# Patient Record
Sex: Female | Born: 1960 | Race: White | Hispanic: No | Smoking: Current every day smoker
Health system: Southern US, Community
[De-identification: ages and names within clinical notes are randomized; demographics above are authoritative.]

## PROBLEM LIST (undated history)

## (undated) DIAGNOSIS — F419 Anxiety disorder, unspecified: Secondary | ICD-10-CM

## (undated) DIAGNOSIS — M810 Age-related osteoporosis without current pathological fracture: Secondary | ICD-10-CM

## (undated) DIAGNOSIS — Z8052 Family history of malignant neoplasm of bladder: Secondary | ICD-10-CM

## (undated) DIAGNOSIS — J449 Chronic obstructive pulmonary disease, unspecified: Secondary | ICD-10-CM

## (undated) DIAGNOSIS — J439 Emphysema, unspecified: Secondary | ICD-10-CM

## (undated) DIAGNOSIS — L930 Discoid lupus erythematosus: Secondary | ICD-10-CM

## (undated) DIAGNOSIS — E079 Disorder of thyroid, unspecified: Secondary | ICD-10-CM

## (undated) DIAGNOSIS — M329 Systemic lupus erythematosus, unspecified: Secondary | ICD-10-CM

## (undated) DIAGNOSIS — F32A Depression, unspecified: Secondary | ICD-10-CM

## (undated) DIAGNOSIS — Z8481 Family history of carrier of genetic disease: Secondary | ICD-10-CM

## (undated) DIAGNOSIS — E7800 Pure hypercholesterolemia, unspecified: Secondary | ICD-10-CM

## (undated) DIAGNOSIS — C50919 Malignant neoplasm of unspecified site of unspecified female breast: Secondary | ICD-10-CM

## (undated) DIAGNOSIS — G709 Myoneural disorder, unspecified: Secondary | ICD-10-CM

## (undated) DIAGNOSIS — F411 Generalized anxiety disorder: Secondary | ICD-10-CM

## (undated) DIAGNOSIS — M62838 Other muscle spasm: Secondary | ICD-10-CM

## (undated) DIAGNOSIS — Z803 Family history of malignant neoplasm of breast: Secondary | ICD-10-CM

## (undated) DIAGNOSIS — Z9221 Personal history of antineoplastic chemotherapy: Secondary | ICD-10-CM

## (undated) DIAGNOSIS — K219 Gastro-esophageal reflux disease without esophagitis: Secondary | ICD-10-CM

## (undated) DIAGNOSIS — G629 Polyneuropathy, unspecified: Secondary | ICD-10-CM

## (undated) DIAGNOSIS — D649 Anemia, unspecified: Secondary | ICD-10-CM

## (undated) DIAGNOSIS — T7840XA Allergy, unspecified, initial encounter: Secondary | ICD-10-CM

## (undated) DIAGNOSIS — E039 Hypothyroidism, unspecified: Secondary | ICD-10-CM

## (undated) DIAGNOSIS — J45909 Unspecified asthma, uncomplicated: Secondary | ICD-10-CM

## (undated) DIAGNOSIS — Z8042 Family history of malignant neoplasm of prostate: Secondary | ICD-10-CM

## (undated) DIAGNOSIS — J189 Pneumonia, unspecified organism: Secondary | ICD-10-CM

## (undated) HISTORY — DX: Personal history of antineoplastic chemotherapy: Z92.21

## (undated) HISTORY — DX: Systemic lupus erythematosus, unspecified: M32.9

## (undated) HISTORY — DX: Emphysema, unspecified: J43.9

## (undated) HISTORY — DX: Family history of carrier of genetic disease: Z84.81

## (undated) HISTORY — DX: Depression, unspecified: F32.A

## (undated) HISTORY — DX: Generalized anxiety disorder: F41.1

## (undated) HISTORY — PX: TONSILLECTOMY: SUR1361

## (undated) HISTORY — DX: Myoneural disorder, unspecified: G70.9

## (undated) HISTORY — DX: Family history of malignant neoplasm of bladder: Z80.52

## (undated) HISTORY — DX: Family history of malignant neoplasm of breast: Z80.3

## (undated) HISTORY — DX: Family history of malignant neoplasm of prostate: Z80.42

## (undated) HISTORY — PX: WISDOM TOOTH EXTRACTION: SHX21

## (undated) HISTORY — DX: Allergy, unspecified, initial encounter: T78.40XA

## (undated) HISTORY — DX: Disorder of thyroid, unspecified: E07.9

## (undated) HISTORY — DX: Age-related osteoporosis without current pathological fracture: M81.0

## (undated) HISTORY — DX: Polyneuropathy, unspecified: G62.9

## (undated) HISTORY — PX: ABLATION ON ENDOMETRIOSIS: SHX5787

## (undated) HISTORY — PX: TUBAL LIGATION: SHX77

## (undated) HISTORY — DX: Other muscle spasm: M62.838

## (undated) HISTORY — PX: BREAST LUMPECTOMY: SHX2

## (undated) HISTORY — DX: Pure hypercholesterolemia, unspecified: E78.00

---

## 1998-01-31 ENCOUNTER — Encounter: Payer: Self-pay | Admitting: Neurosurgery

## 1998-01-31 ENCOUNTER — Ambulatory Visit (HOSPITAL_COMMUNITY): Admission: RE | Admit: 1998-01-31 | Discharge: 1998-01-31 | Payer: Self-pay | Admitting: Neurosurgery

## 1999-08-05 ENCOUNTER — Ambulatory Visit (HOSPITAL_COMMUNITY): Admission: RE | Admit: 1999-08-05 | Discharge: 1999-08-05 | Payer: Self-pay | Admitting: Obstetrics and Gynecology

## 2001-02-26 ENCOUNTER — Other Ambulatory Visit: Admission: RE | Admit: 2001-02-26 | Discharge: 2001-02-26 | Payer: Self-pay | Admitting: Obstetrics and Gynecology

## 2002-02-26 ENCOUNTER — Other Ambulatory Visit: Admission: RE | Admit: 2002-02-26 | Discharge: 2002-02-26 | Payer: Self-pay | Admitting: Obstetrics and Gynecology

## 2002-11-20 ENCOUNTER — Encounter (INDEPENDENT_AMBULATORY_CARE_PROVIDER_SITE_OTHER): Payer: Self-pay | Admitting: Specialist

## 2002-11-20 ENCOUNTER — Ambulatory Visit (HOSPITAL_BASED_OUTPATIENT_CLINIC_OR_DEPARTMENT_OTHER): Admission: RE | Admit: 2002-11-20 | Discharge: 2002-11-20 | Payer: Self-pay | Admitting: General Surgery

## 2003-03-13 ENCOUNTER — Other Ambulatory Visit: Admission: RE | Admit: 2003-03-13 | Discharge: 2003-03-13 | Payer: Self-pay | Admitting: Obstetrics and Gynecology

## 2004-06-13 ENCOUNTER — Other Ambulatory Visit: Admission: RE | Admit: 2004-06-13 | Discharge: 2004-06-13 | Payer: Self-pay | Admitting: Obstetrics and Gynecology

## 2004-11-21 ENCOUNTER — Ambulatory Visit (HOSPITAL_COMMUNITY): Admission: RE | Admit: 2004-11-21 | Discharge: 2004-11-21 | Payer: Self-pay | Admitting: Endocrinology

## 2006-01-16 ENCOUNTER — Other Ambulatory Visit: Admission: RE | Admit: 2006-01-16 | Discharge: 2006-01-16 | Payer: Self-pay | Admitting: Obstetrics and Gynecology

## 2006-01-23 ENCOUNTER — Ambulatory Visit (HOSPITAL_COMMUNITY): Admission: RE | Admit: 2006-01-23 | Discharge: 2006-01-23 | Payer: Self-pay | Admitting: Obstetrics and Gynecology

## 2006-01-23 IMAGING — MG MM DIGITAL SCREENING BILAT
4 series · 4 of 4 positions shown · non-contrast
Comparison: none

DG SCREEN MAMMOGRAM BILATERAL
Bilateral CC and MLO view(s) were taken.
Radiologist: SURIN, M.D.

DIGITAL SCREENING MAMMOGRAM WITH CAD:
Prior films are needed for comparison. These will be obtained prior to interpretation.
Mammogram originally interpreted by Dr. SURIN on [DATE].
REVIEW OF OUTSIDE STUDY
Review of Outside Study:
No prior films could be obtained for comparison.
There is a fibroglandular pattern.  A possible mass is noted in the left breast.  Spot compression 
views and possibly sonography are recommended for further evaluation.  In the right breast, no 
masses or malignant type calcifications are identified.

[R CC]
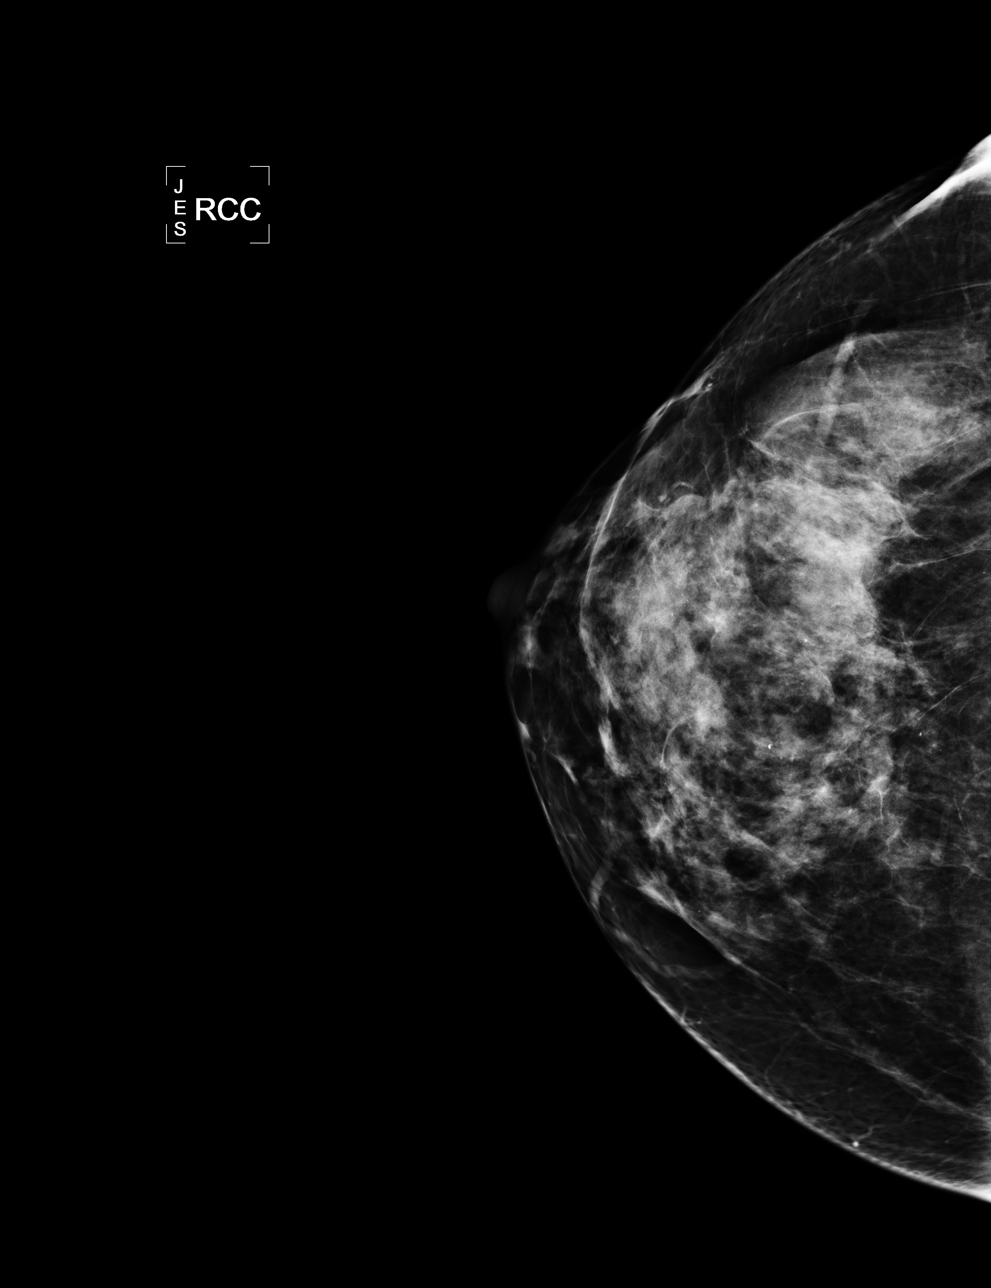

[R MLO]
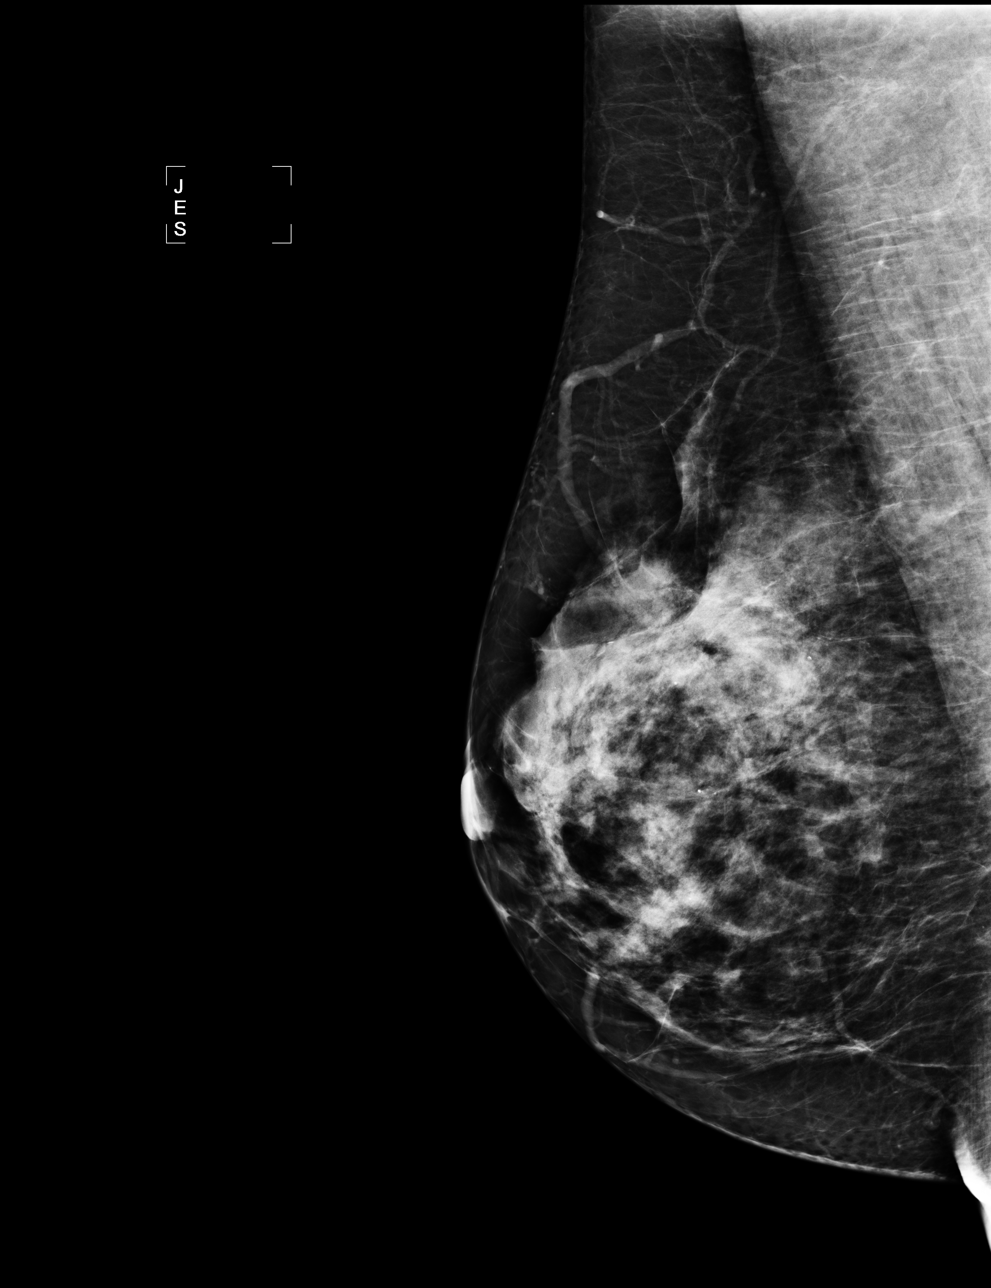

[L CC]
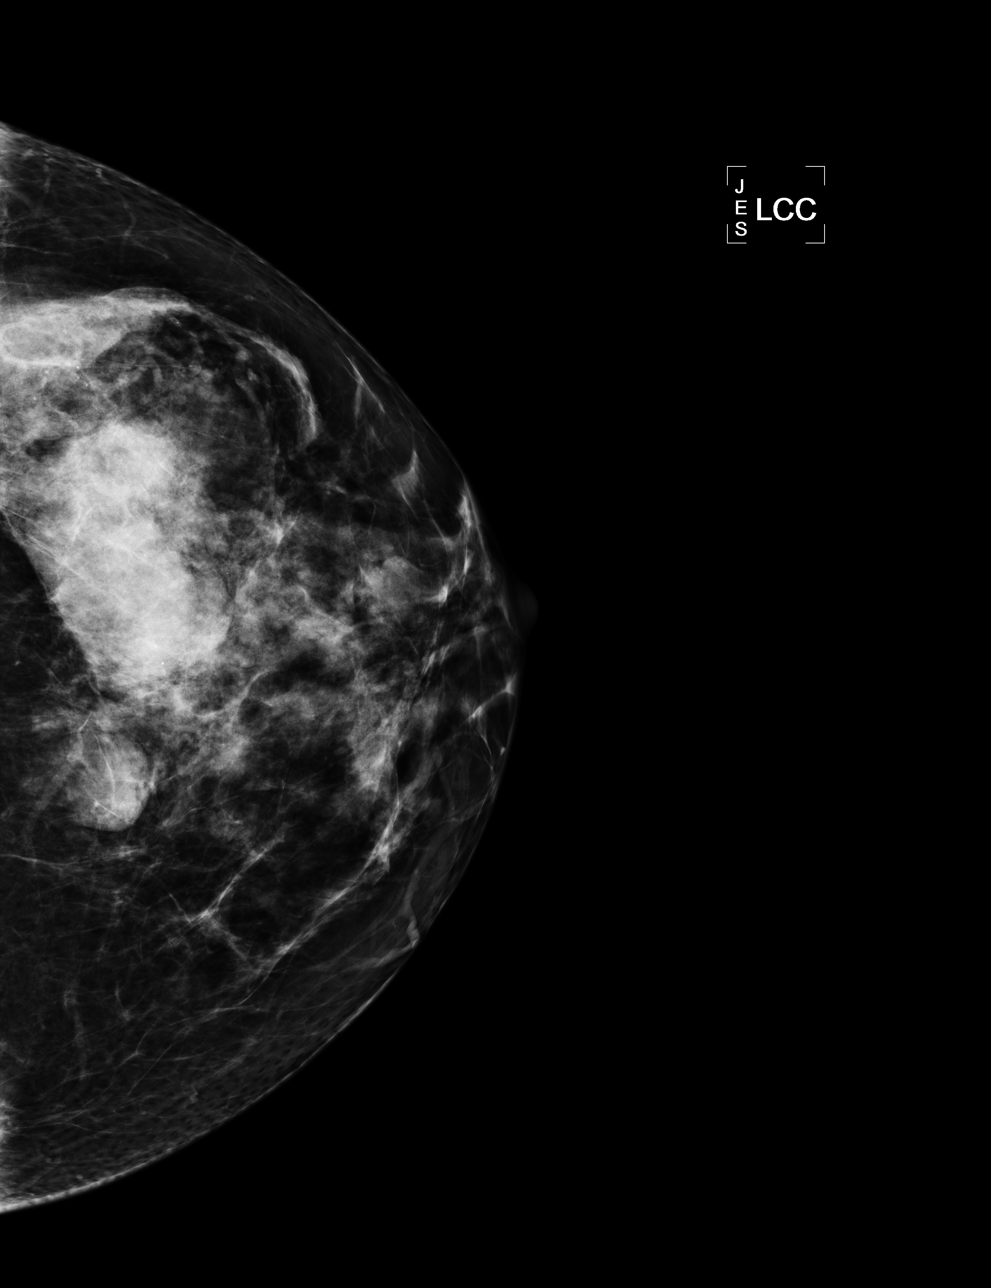

[L MLO]
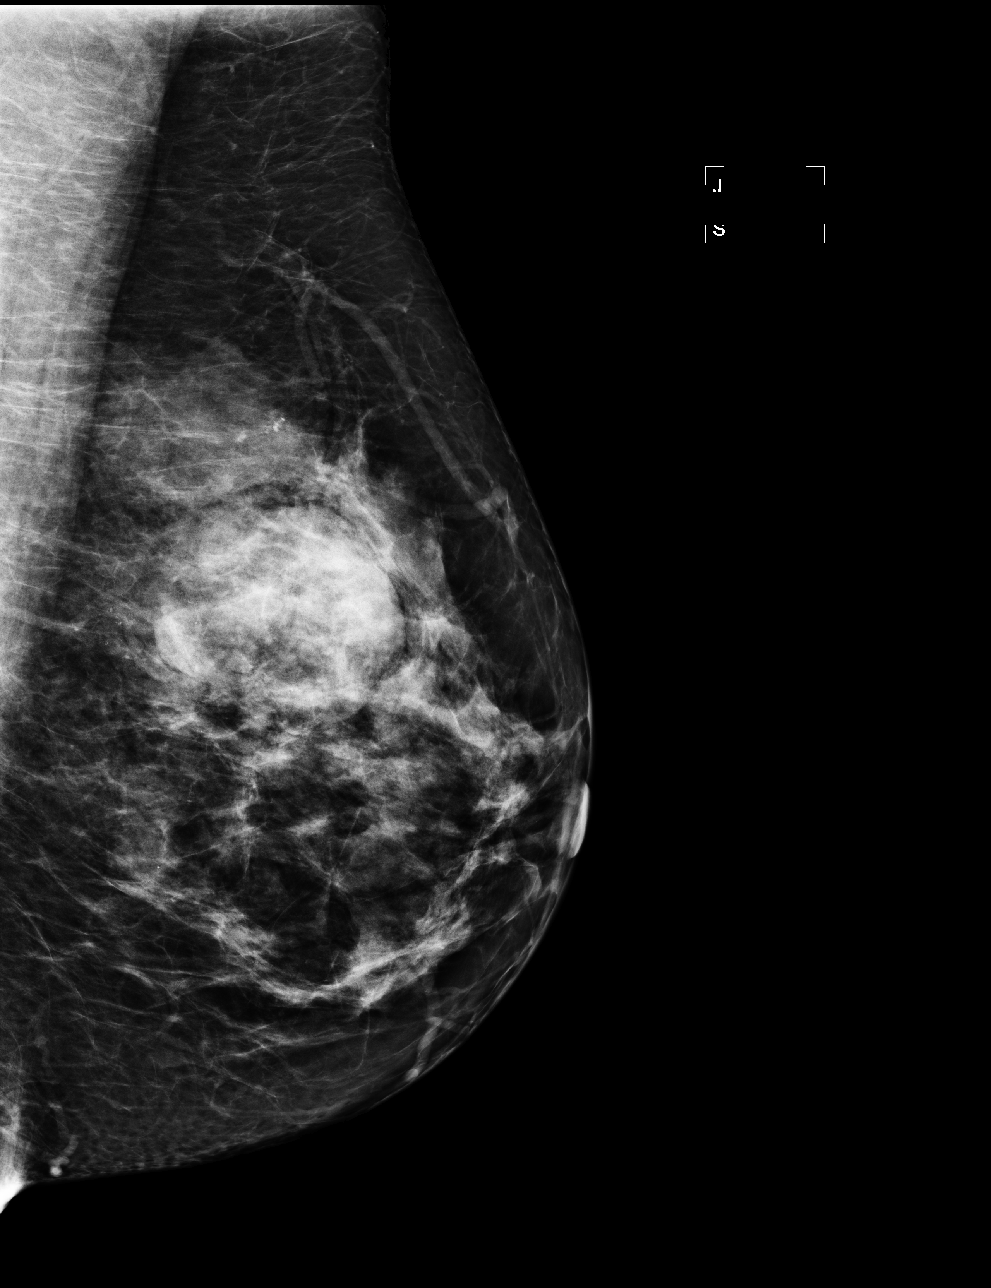

[4 of 4 positions shown; findings below may reference images not displayed]

IMPRESSION: Possible mass, left breast.  Additional evaluation is indicated.  The patient will be contacted for
additional studies and a supplementary report will follow.  No specific mammographic evidence of 
malignancy, right breast.

ASSESSMENT: Need additional imaging evaluation and/or prior mammograms for comparison - BI-RADS 0

Further imaging of the left breast.
ANALYZED BY COMPUTER AIDED DETECTION. ,

## 2006-02-28 ENCOUNTER — Encounter: Admission: RE | Admit: 2006-02-28 | Discharge: 2006-02-28 | Payer: Self-pay | Admitting: Obstetrics and Gynecology

## 2006-02-28 IMAGING — MG MM DIAGNOSTIC LTD LEFT
4 series · 4 of 4 positions shown · non-contrast
Comparison: none

[REDACTED] LEFT
CC and MLO view(s) were taken of the left breast.

LEFT BREAST ULTRASOUND
DIGITAL LIMITED LEFT DIAGNOSTIC MAMMOGRAM AND LEFT BREAST ULTRASOUND:
CLINICAL DATA: Abnormal screening mammogram.

[L CC (1 of 3)]
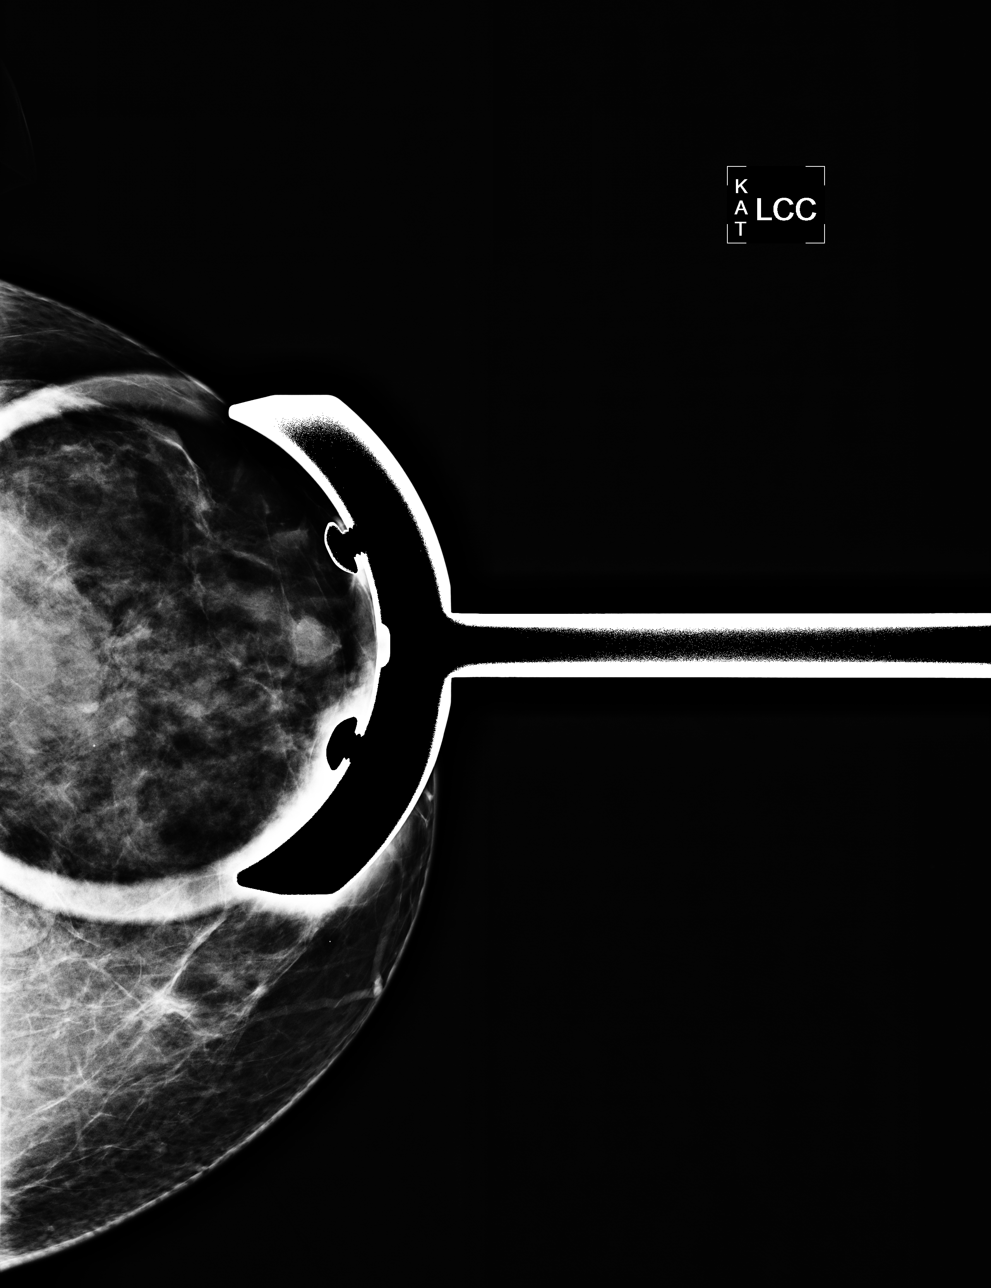

[L CC (2 of 3)]
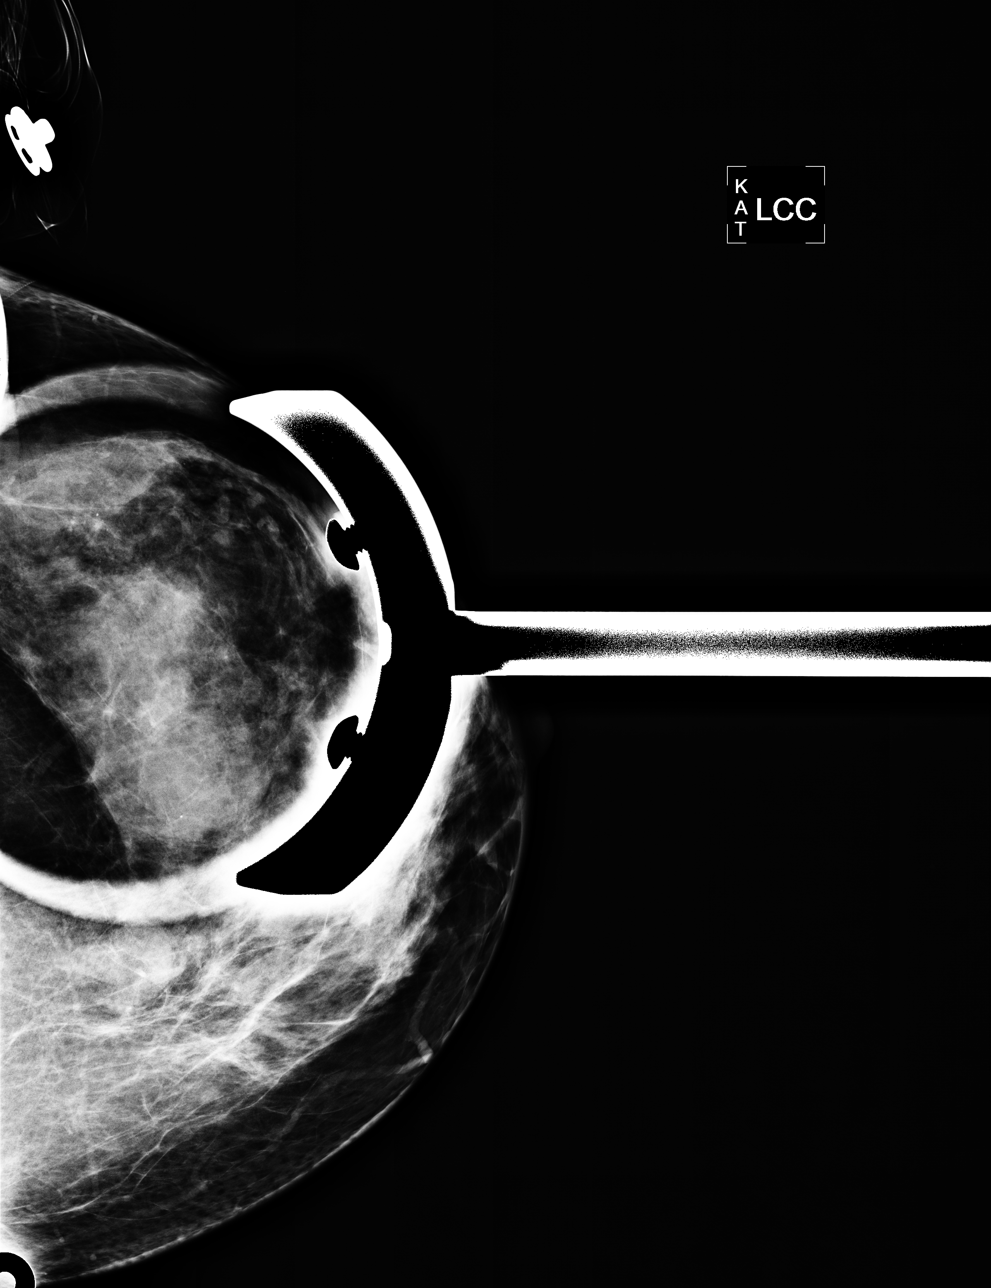

[L MLO]
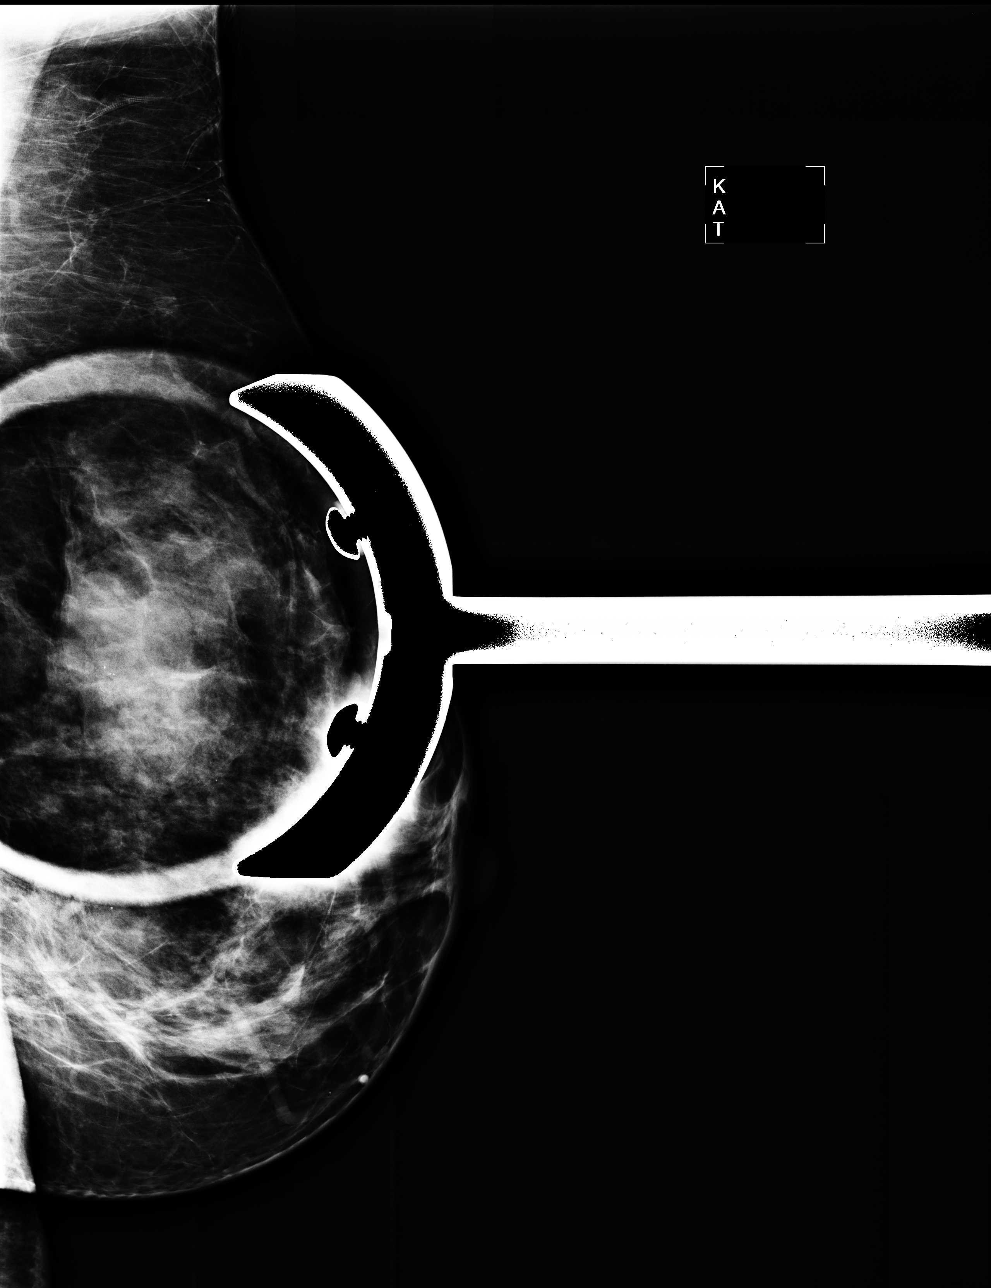

[L CC (3 of 3)]
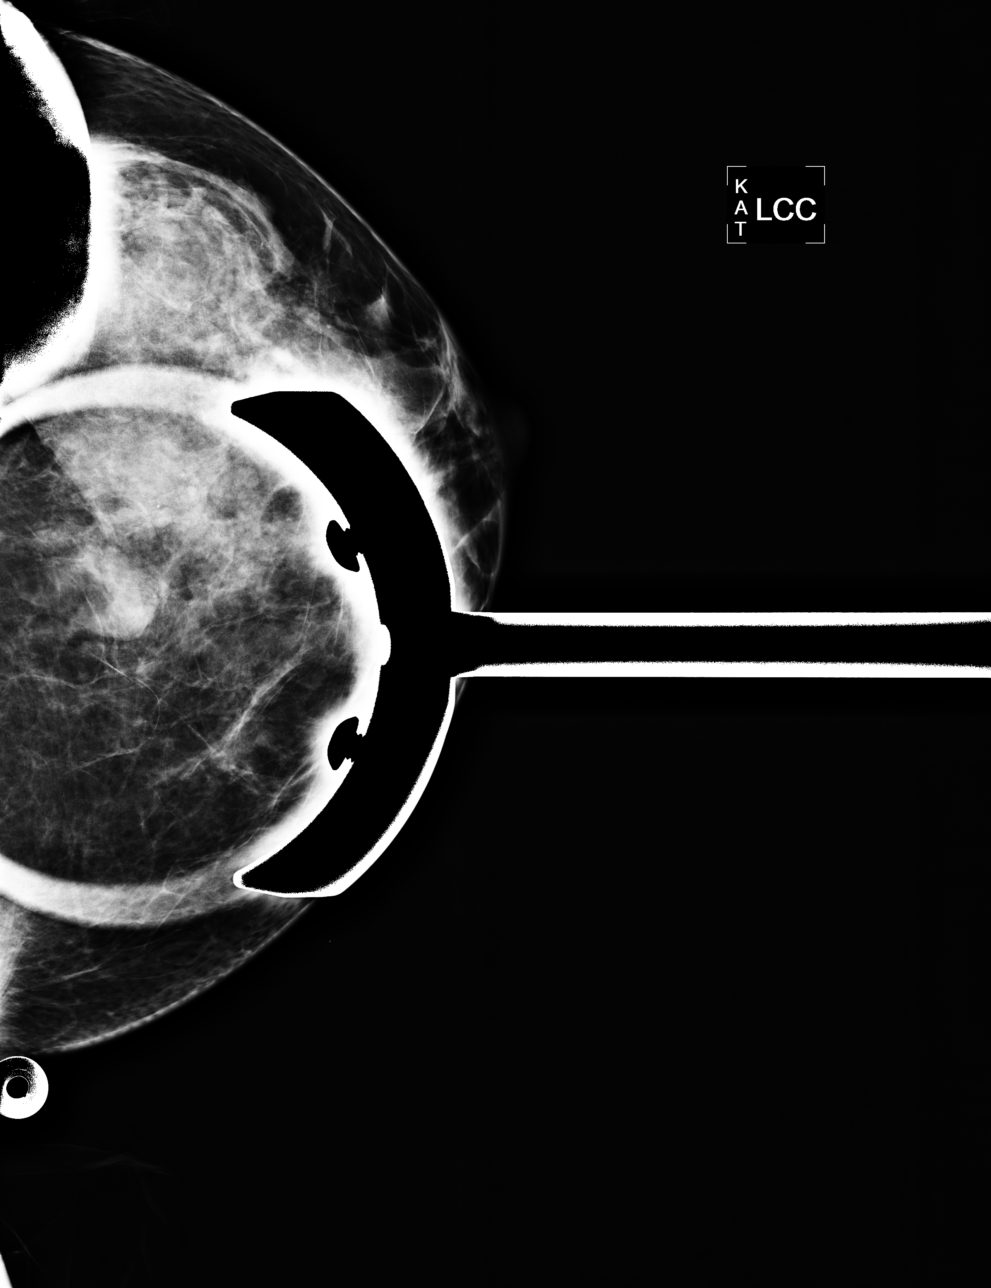

[4 of 4 positions shown; findings below may reference images not displayed]

The nodular densities located centrally and superiorly within the left breast were evaluated with 
focally compressed left CC and MLO views.  The nodules persisted, however, there margins were 
obscured by adjacent dense breast tissue.

Left breast ultrasound was performed.  This demonstrates multiple simple-appearing cysts within the
superior portion of the left breast.  There is a 3.7 x 1.1 cm in size cyst with a thin central 
septation present at the 1 o'clock position within the left breast in zone 2.  There are smaller 
adjacent cysts.  An 8 mm in size cyst is located at the 2 o'clock position and there is also 
several cysts located at the 12 o'clock position.  There is no evidence for a solid mass and there 
is no architectural distortion or abnormal acoustic shadowing.
IMPRESSION: Multiple simple-appearing cysts located superiorly within the left breast.  The largest cyst 
measures 3.7 cm in size.  No specific evidence for malignancy.  Recommend annual screening 
mammography.

ASSESSMENT: Benign - BI-RADS 2

Screening mammogram of both breasts in 1 year.
,

## 2006-02-28 IMAGING — US UNKNOWN PR STUDY
1 series · 6 of 6 positions shown · non-contrast
Comparison: none

[REDACTED] LEFT
CC and MLO view(s) were taken of the left breast.

LEFT BREAST ULTRASOUND
DIGITAL LIMITED LEFT DIAGNOSTIC MAMMOGRAM AND LEFT BREAST ULTRASOUND:
CLINICAL DATA: Abnormal screening mammogram.

[Series 1: unknown pr study · 6 of 6 slices shown]
[im 1/6]
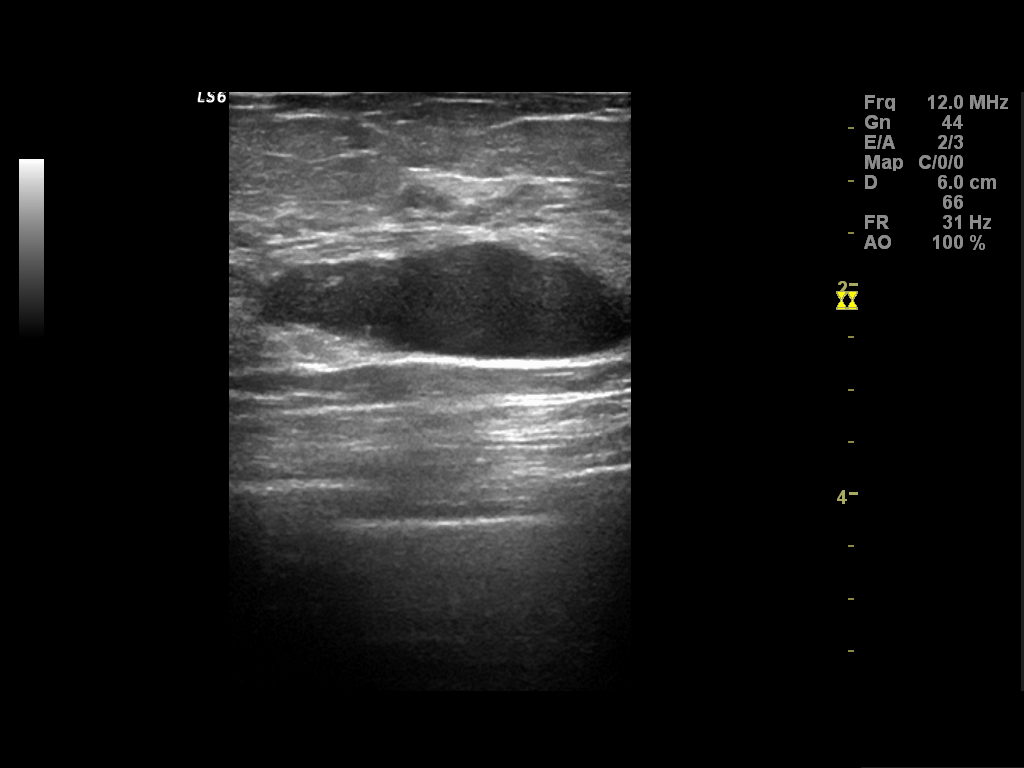
[im 2/6]
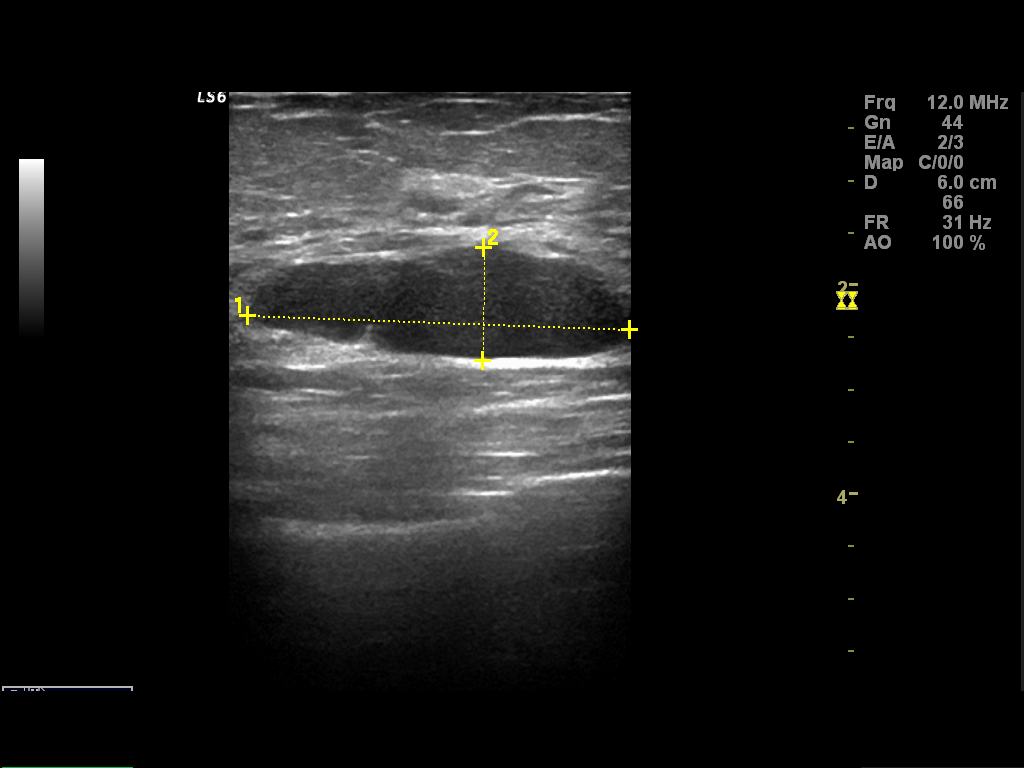
[im 3/6]
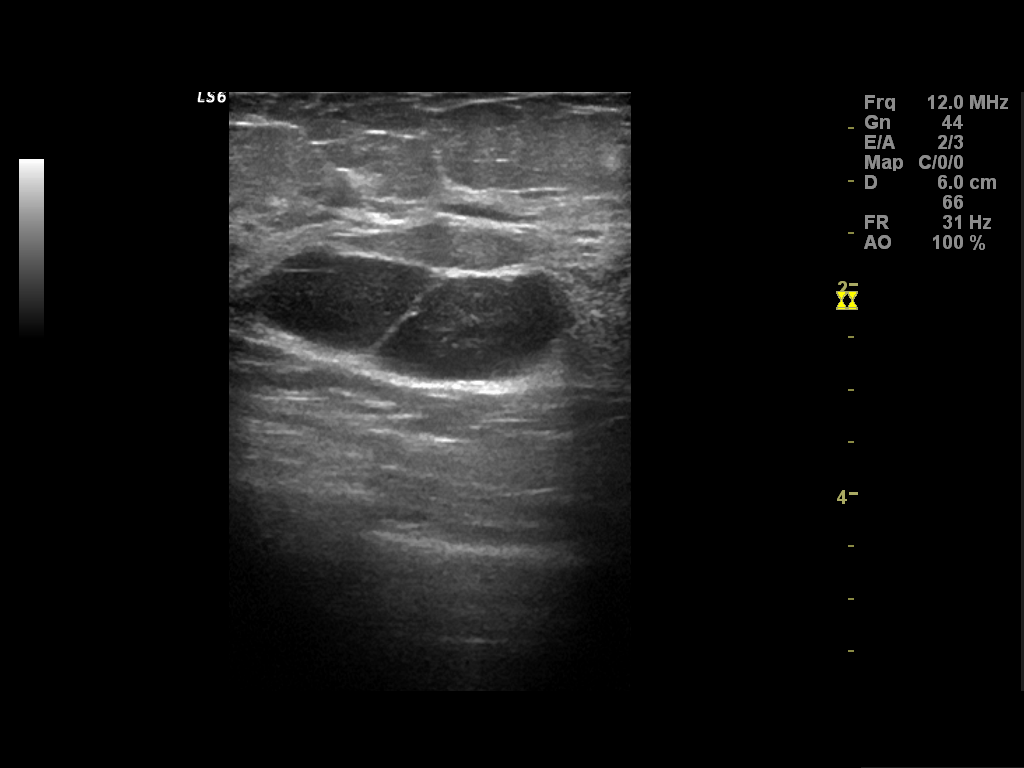
[im 4/6]
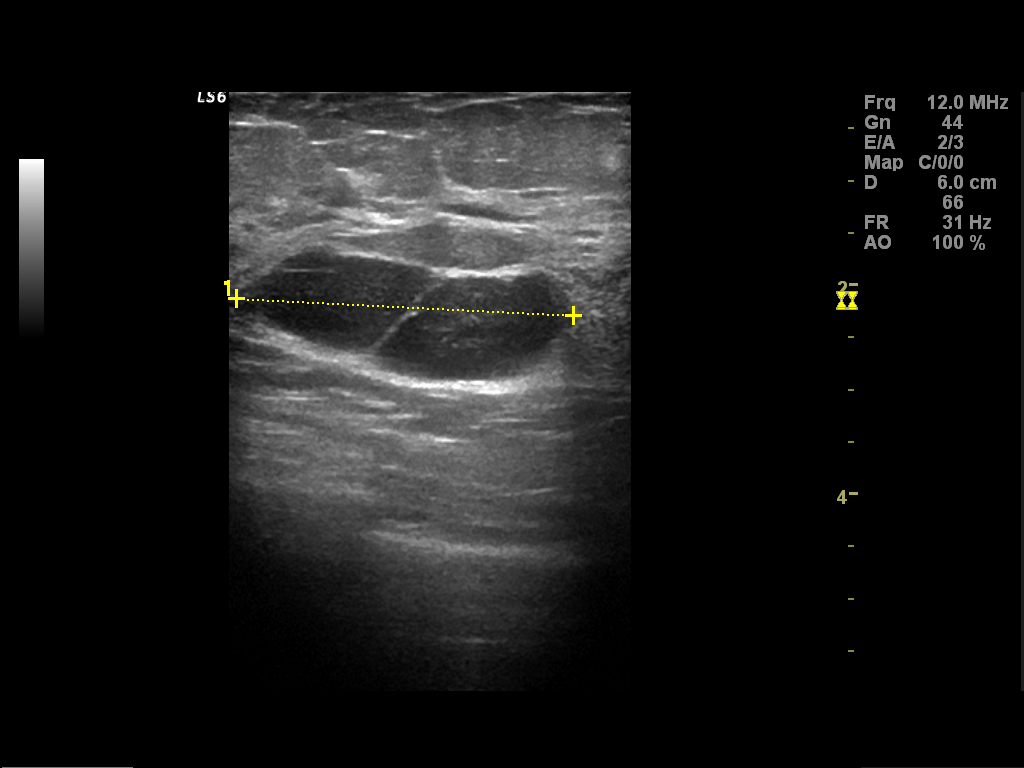
[im 5/6]
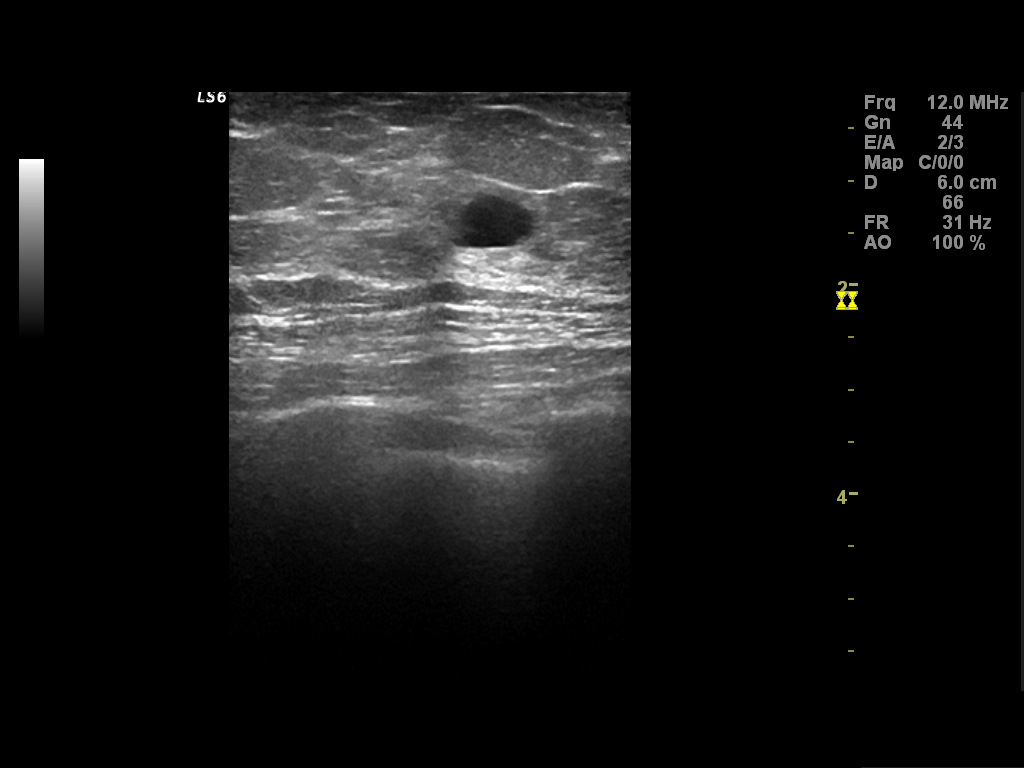
[im 6/6]
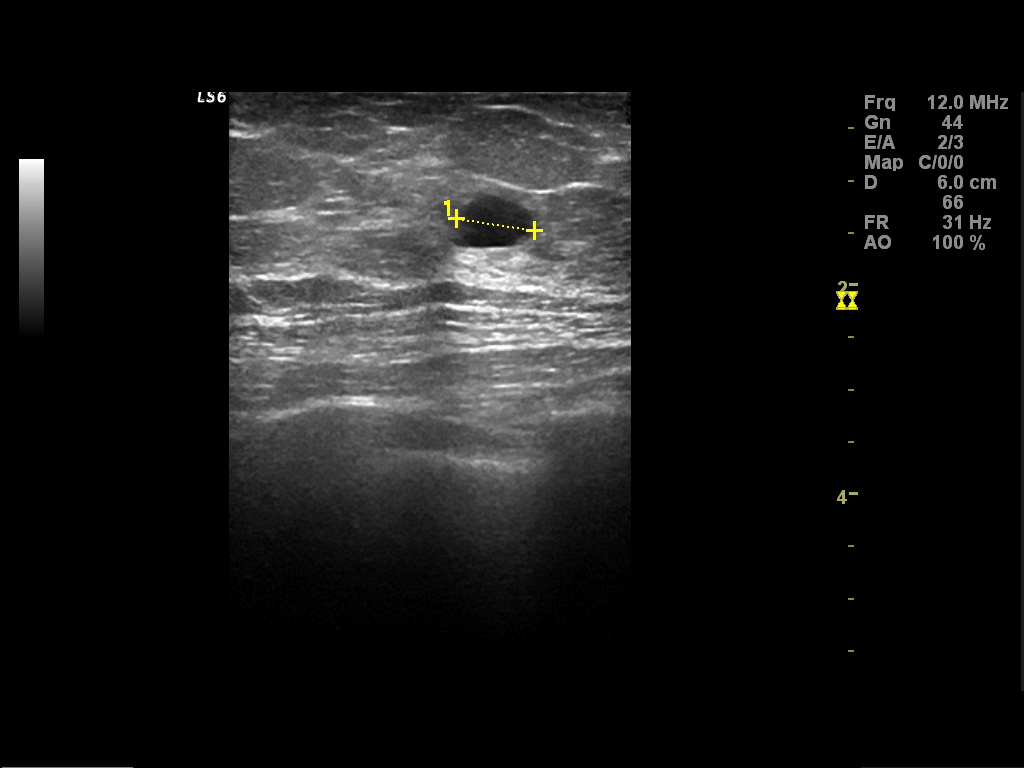

[6 of 6 positions shown; findings below may reference images not displayed]

The nodular densities located centrally and superiorly within the left breast were evaluated with 
focally compressed left CC and MLO views.  The nodules persisted, however, there margins were 
obscured by adjacent dense breast tissue.

Left breast ultrasound was performed.  This demonstrates multiple simple-appearing cysts within the
superior portion of the left breast.  There is a 3.7 x 1.1 cm in size cyst with a thin central 
septation present at the 1 o'clock position within the left breast in zone 2.  There are smaller 
adjacent cysts.  An 8 mm in size cyst is located at the 2 o'clock position and there is also 
several cysts located at the 12 o'clock position.  There is no evidence for a solid mass and there 
is no architectural distortion or abnormal acoustic shadowing.
IMPRESSION: Multiple simple-appearing cysts located superiorly within the left breast.  The largest cyst 
measures 3.7 cm in size.  No specific evidence for malignancy.  Recommend annual screening 
mammography.

ASSESSMENT: Benign - BI-RADS 2

Screening mammogram of both breasts in 1 year.
,

## 2007-03-04 ENCOUNTER — Ambulatory Visit (HOSPITAL_COMMUNITY): Admission: RE | Admit: 2007-03-04 | Discharge: 2007-03-04 | Payer: Self-pay | Admitting: Obstetrics and Gynecology

## 2007-03-04 IMAGING — MG MM DIGITAL SCREENING BILAT
4 series · 4 of 4 positions shown · non-contrast
Comparison: Prior studies.

DG SCREEN MAMMOGRAM BILATERAL
Bilateral CC and MLO view(s) were taken.

DIGITAL SCREENING MAMMOGRAM WITH CAD:

[R CC]
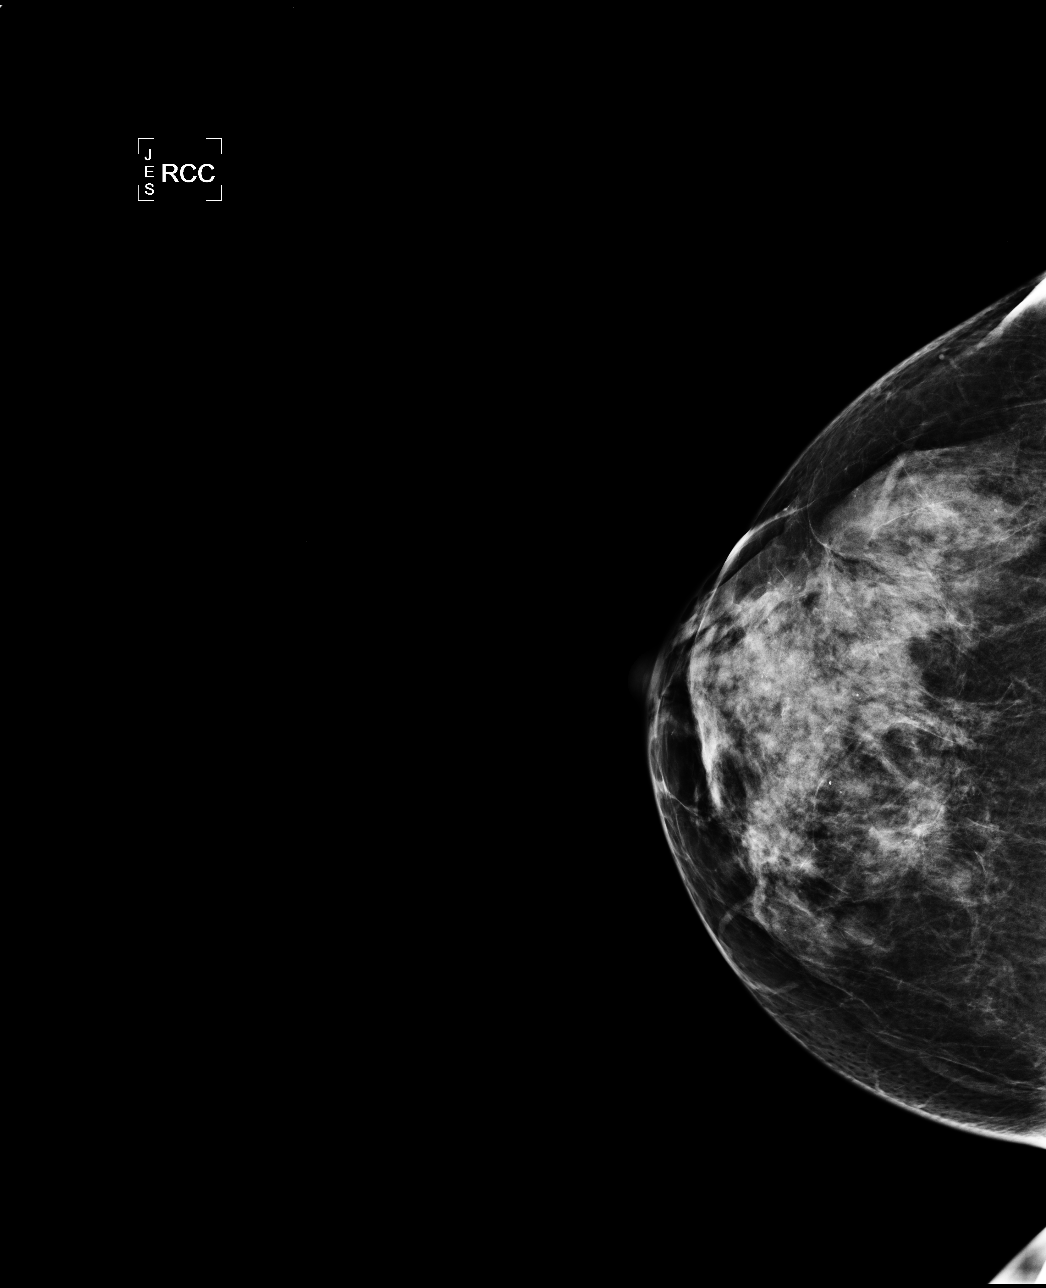

[R MLO]
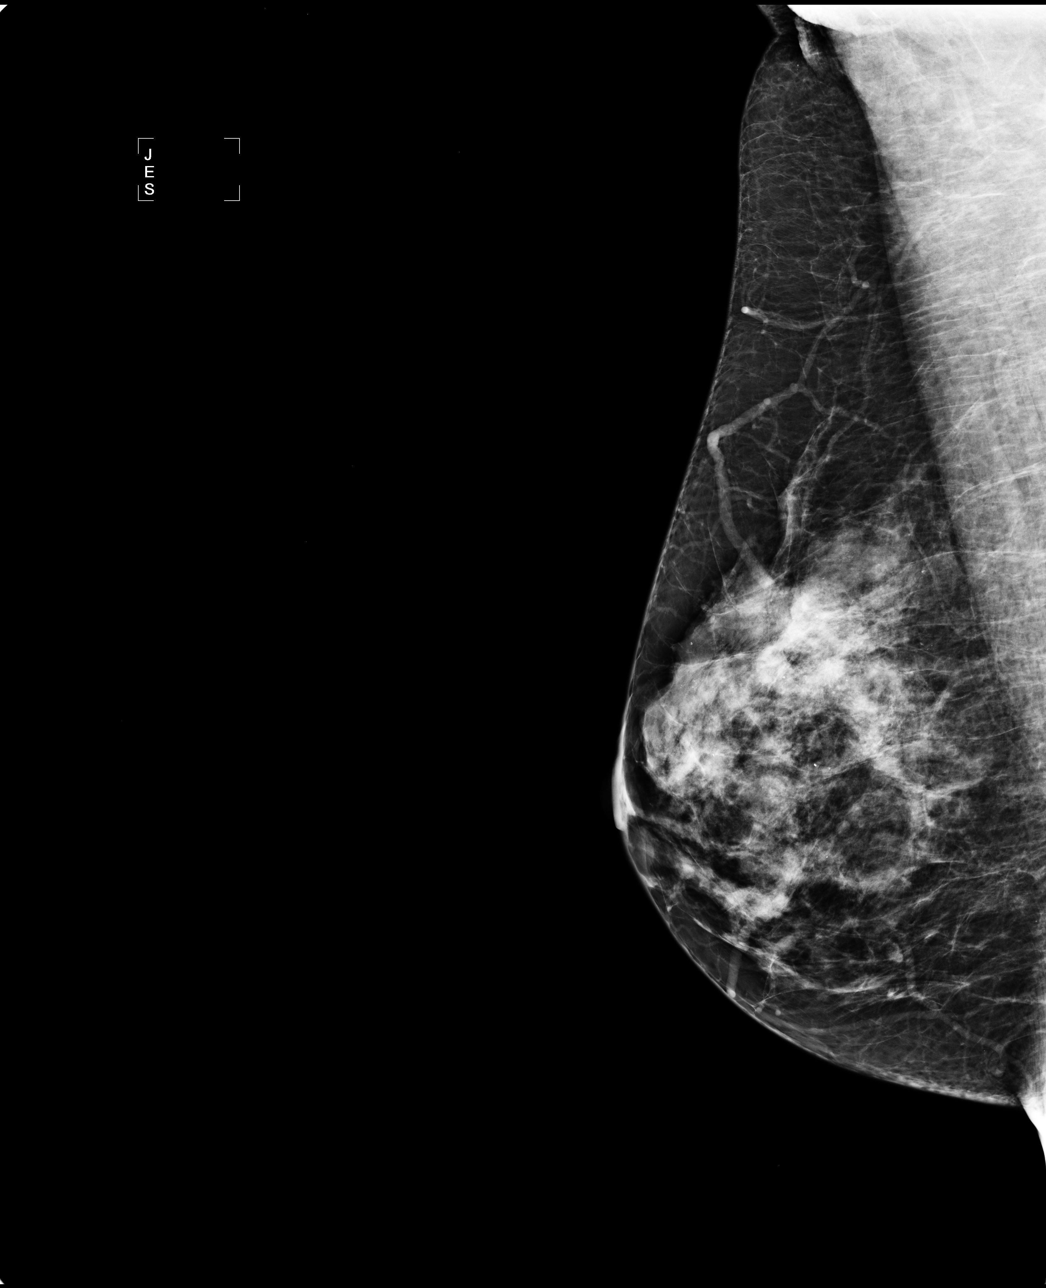

[L CC]
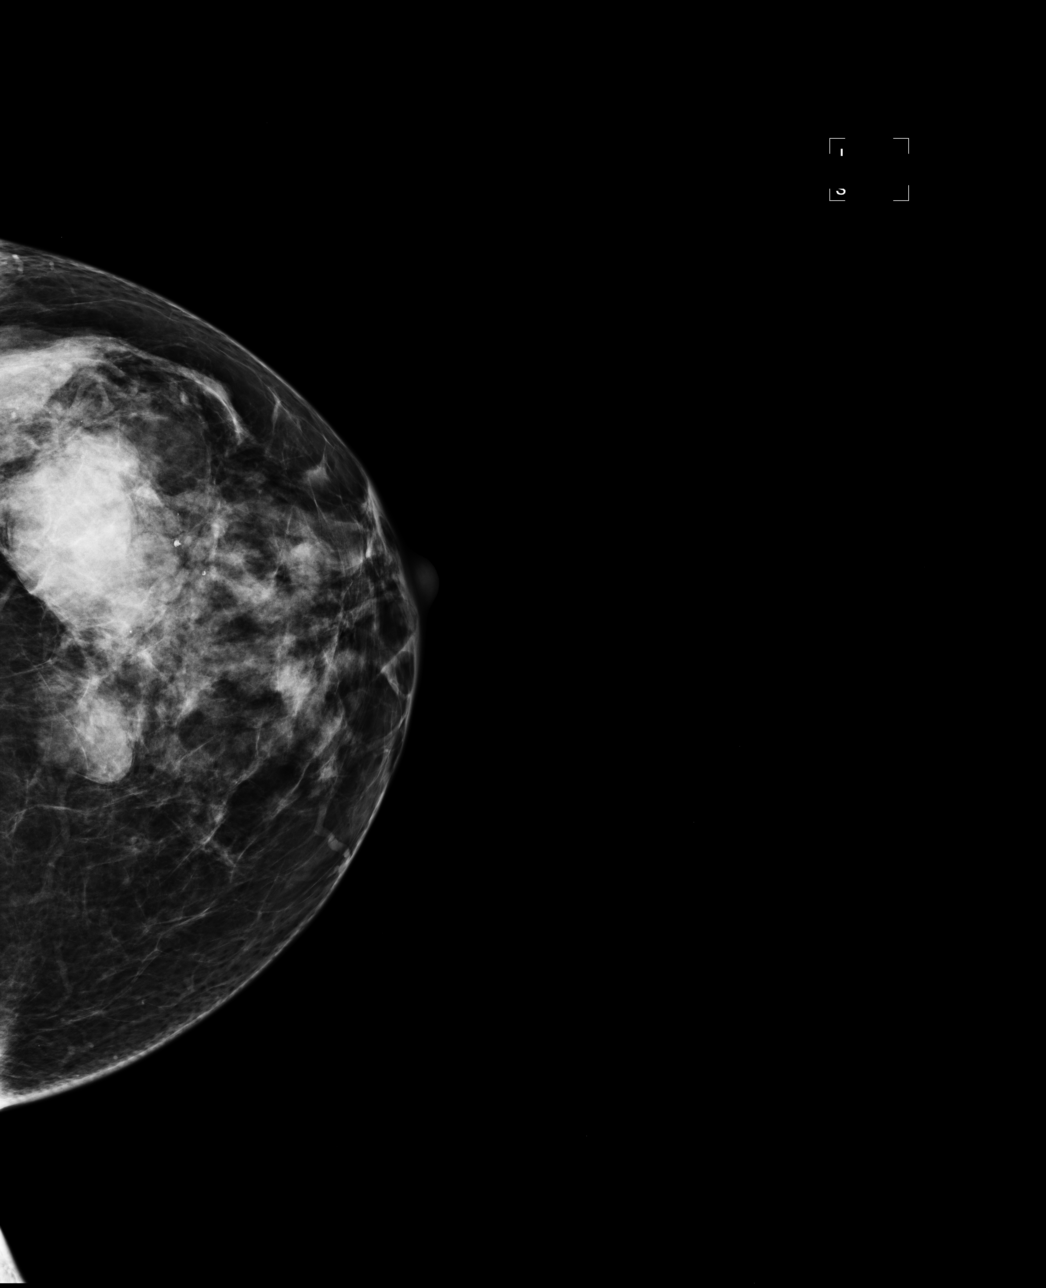

[L MLO]
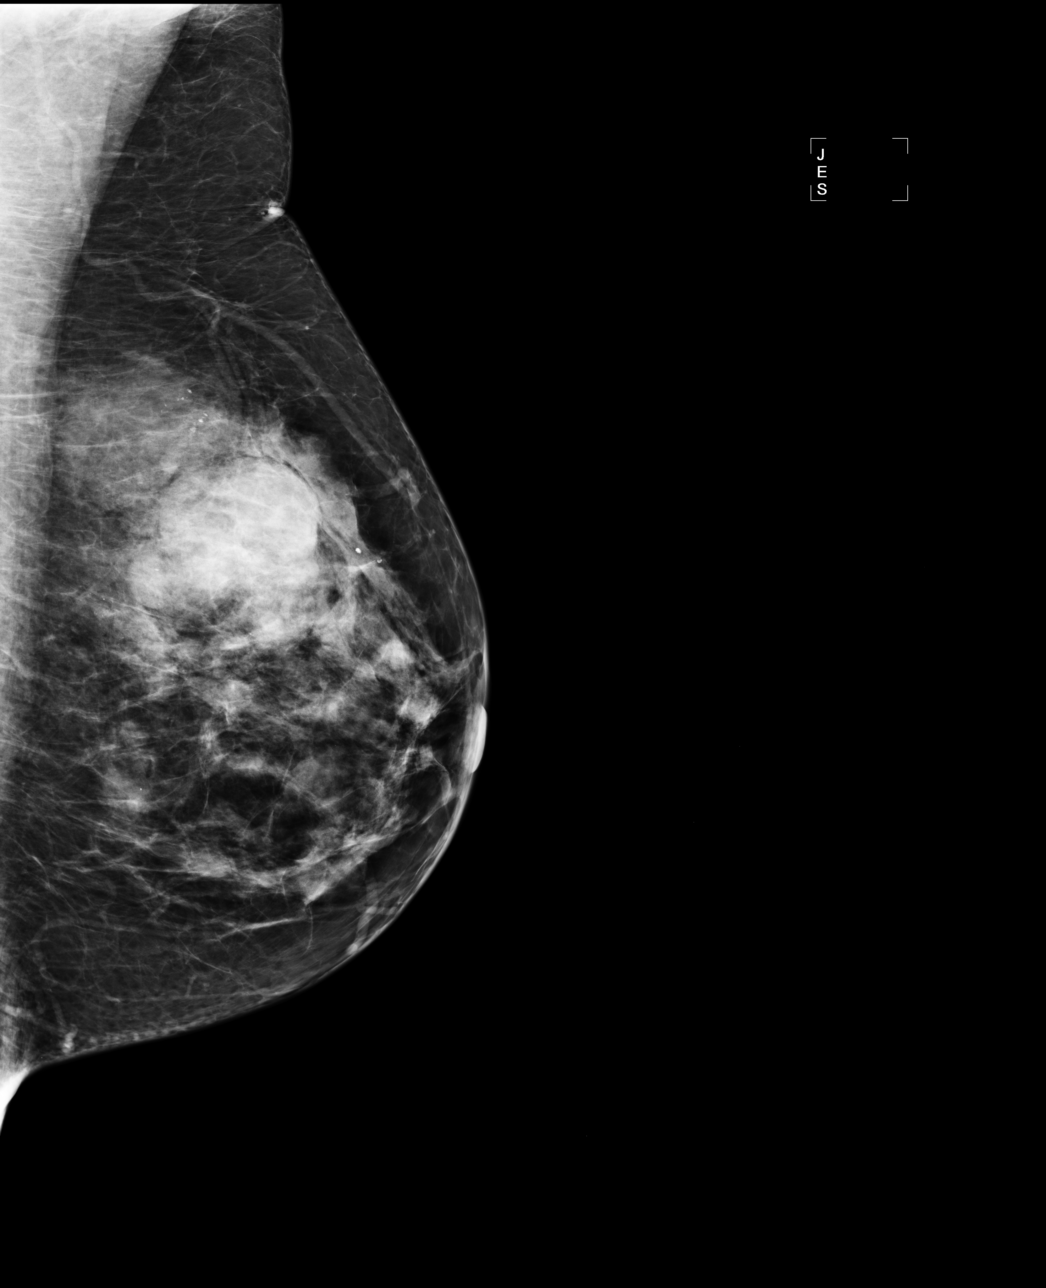

[4 of 4 positions shown; findings below may reference images not displayed]

The breast tissue is heterogeneously dense.  There is no dominant mass, architectural distortion or
calcification to suggest malignancy.
IMPRESSION: No mammographic evidence of malignancy.  Suggest yearly screening mammography.

ASSESSMENT: Negative - BI-RADS 1

Screening mammogram in 1 year.
ANALYZED BY COMPUTER AIDED DETECTION. , THIS PROCEDURE WAS A DIGITAL MAMMOGRAM.

## 2007-06-28 ENCOUNTER — Encounter: Admission: RE | Admit: 2007-06-28 | Discharge: 2007-06-28 | Payer: Self-pay | Admitting: Obstetrics and Gynecology

## 2007-06-28 IMAGING — MG MM DIAGNOSTIC UNILATERAL L
3 series · 3 of 3 positions shown · non-contrast
Comparison: [DATE] and [DATE].

DG DIAGNOSTIC UNILATERAL L
CC and MLO view(s) were taken of the left breast.

LEFT BREAST ULTRASOUND
Technologist: LINDIMAR, Medical
DIGITAL LEFT DIAGNOSTIC MAMMOGRAM AND LEFT BREAST ULTRASOUND:
CLINICAL DATA: Question palpable finding in left breast 1 o'clock location.

[L CC]
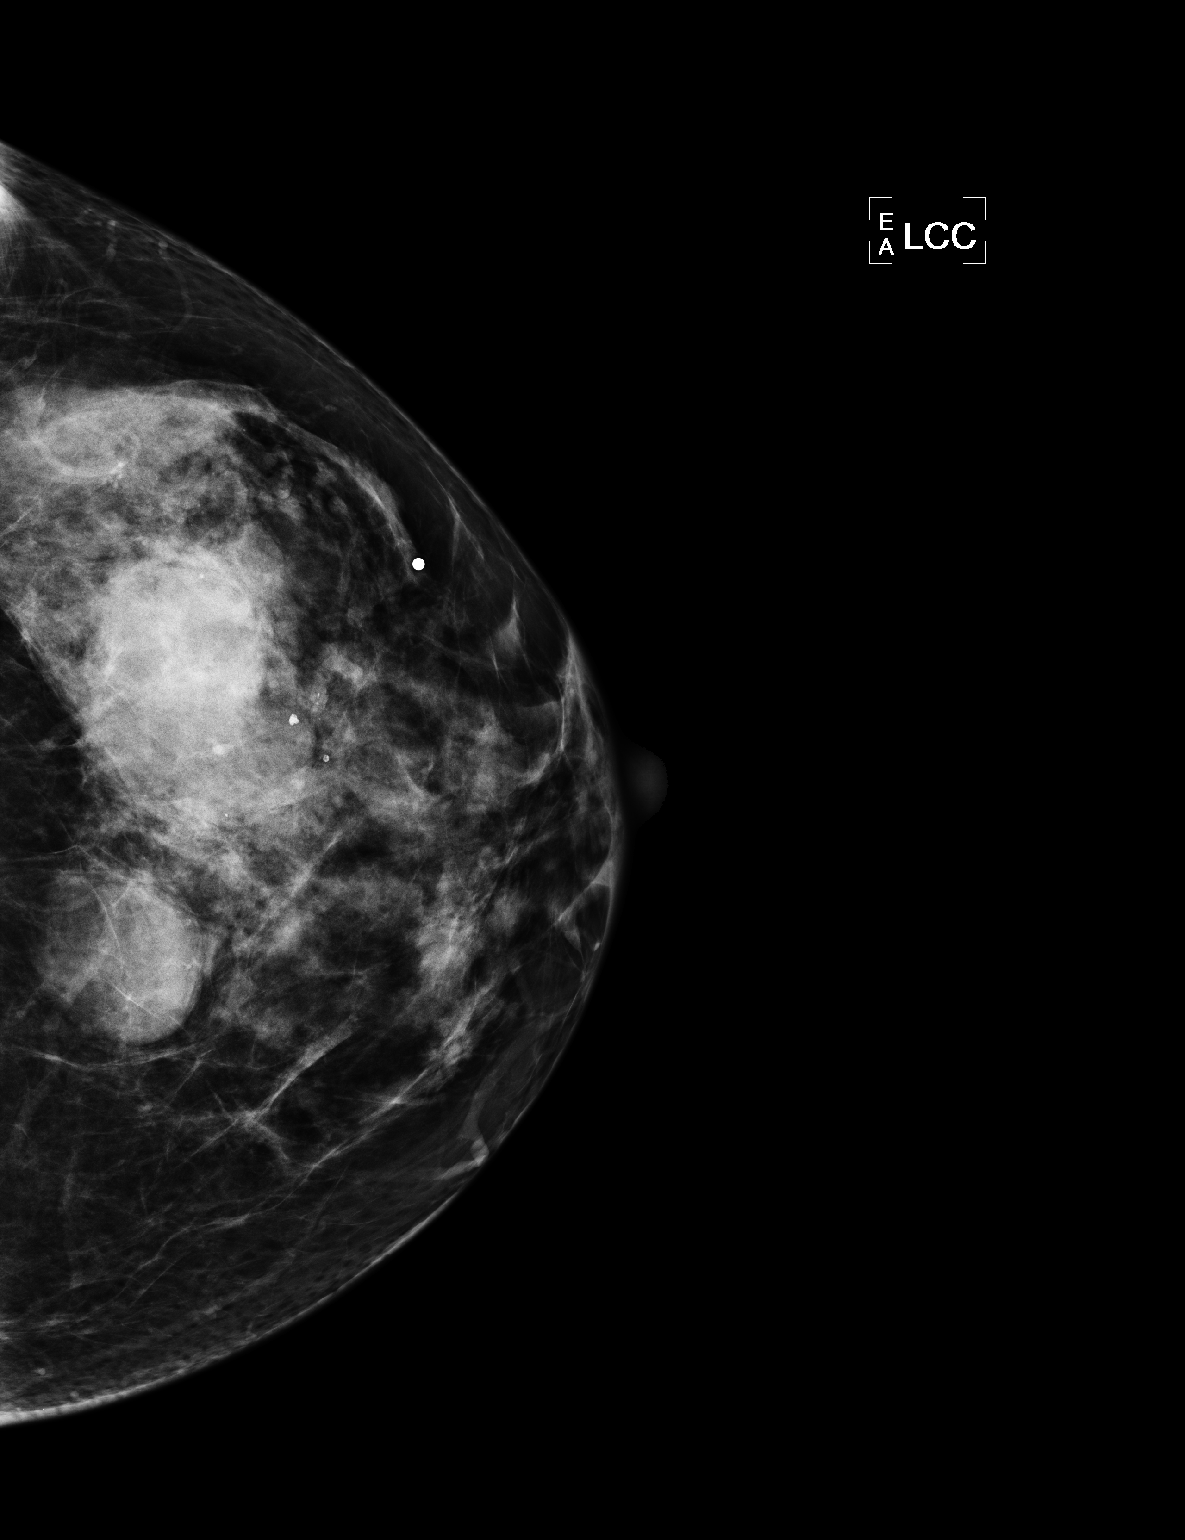

[L MLO]
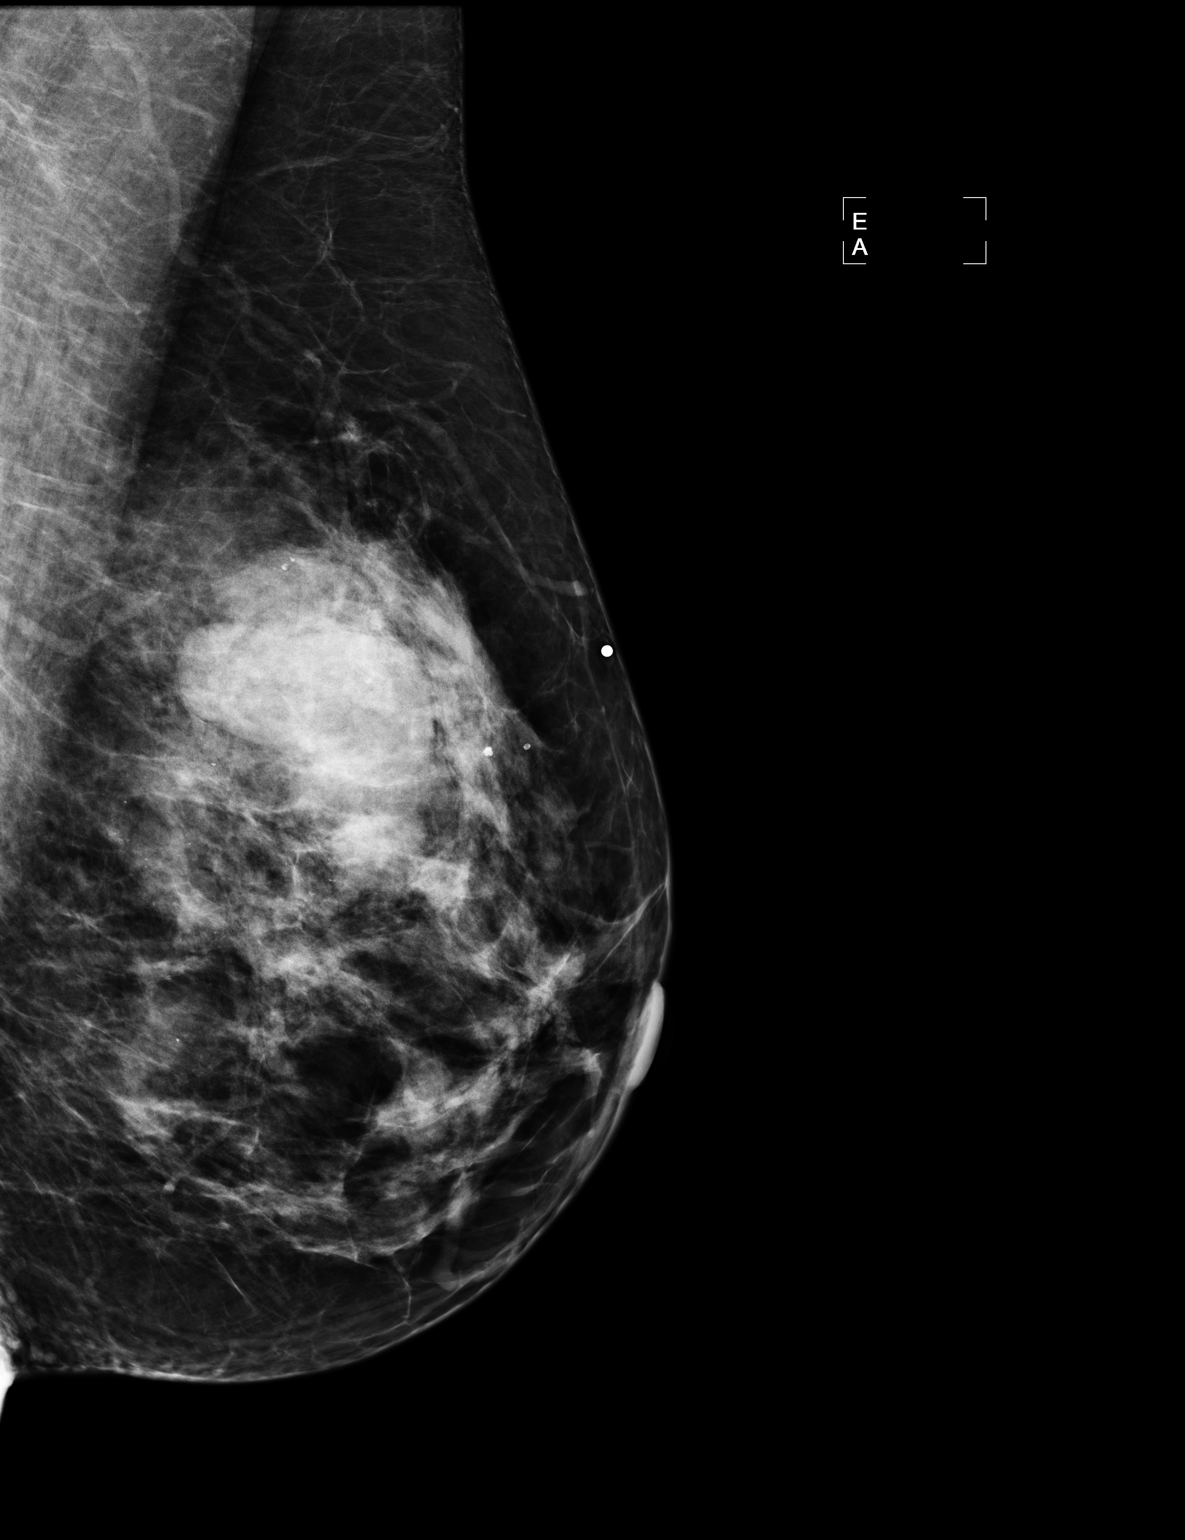

[L TAN]
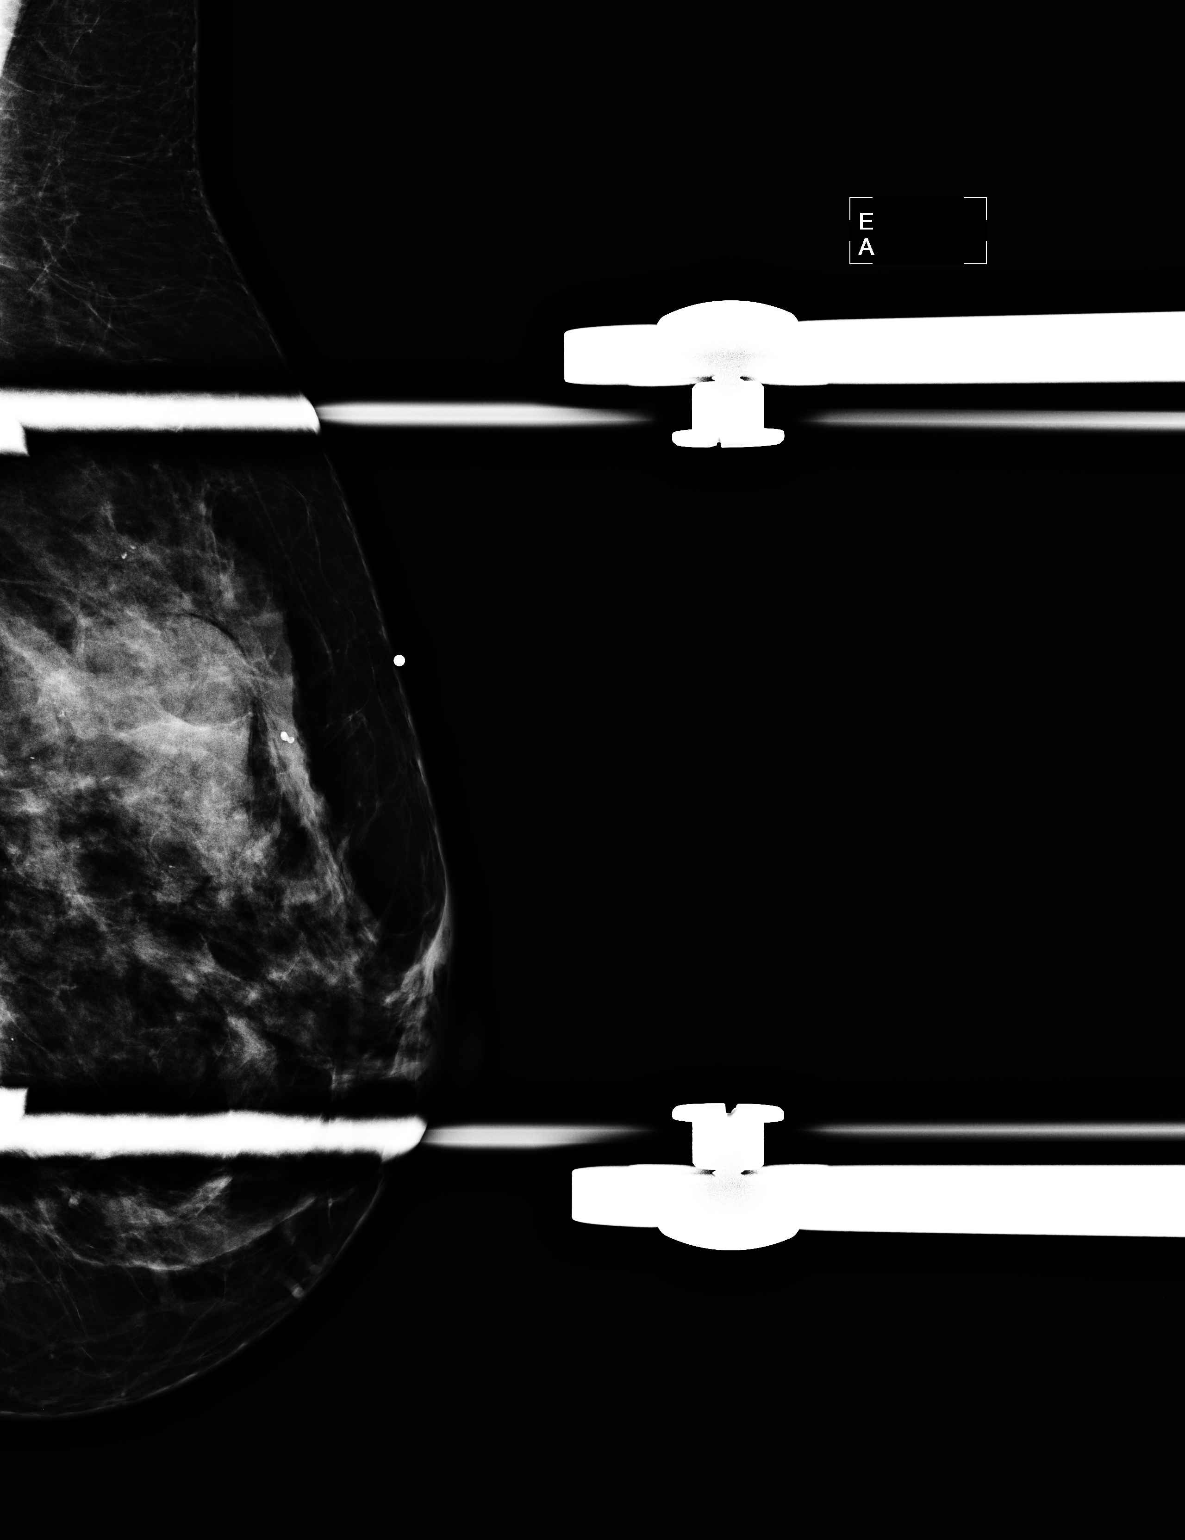

[3 of 3 positions shown; findings below may reference images not displayed]

The left breast parenchyma is extremely dense, which may limit sensitivity of mammography.  Again 
noted are waxing and weaning circumscribed oval and round masses consistent with previously 
evaluated cysts, including one at the 1 o'clock location as indicated by a BB to correspond to the 
patient's questioned palpable finding.

On physical examination, I palpable a smoothly marginated mobile oval mass in the 1 o'clock 
location 3 cm from the nipple.  Ultrasound of this area demonstrates a 2.9 x 2.6 x 2.1 cm simple 
appearing cyst.
IMPRESSION: No evidence for malignancy in the left breast; the questioned palpable finding corresponds to a 
simple appearing cyst.  Screening mammography is recommended in [DATE] to maintain the 
patient's yearly schedule.

ASSESSMENT: Benign - BI-RADS 2

Screening mammogram in 8 months.
,

## 2007-06-28 IMAGING — US UNKNOWN PR STUDY
1 series · 4 of 4 positions shown · non-contrast
Comparison: [DATE] and [DATE].

DG DIAGNOSTIC UNILATERAL L
CC and MLO view(s) were taken of the left breast.

LEFT BREAST ULTRASOUND
Technologist: LINDIMAR, Medical
DIGITAL LEFT DIAGNOSTIC MAMMOGRAM AND LEFT BREAST ULTRASOUND:
CLINICAL DATA: Question palpable finding in left breast 1 o'clock location.

[Series 1: unknown pr study · 4 of 4 slices shown]
[im 1/4]
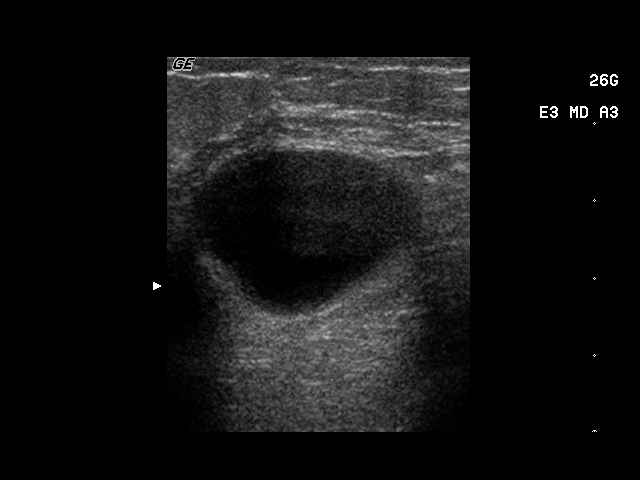
[im 2/4]
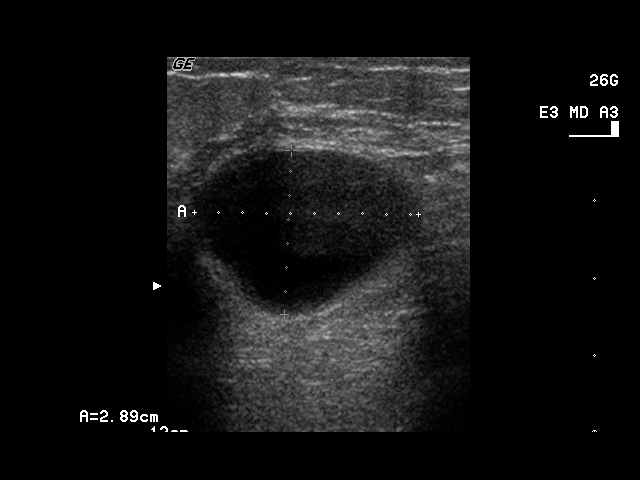
[im 3/4]
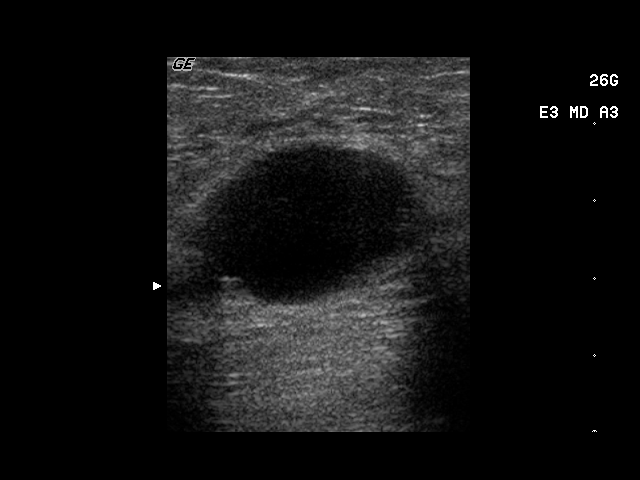
[im 4/4]
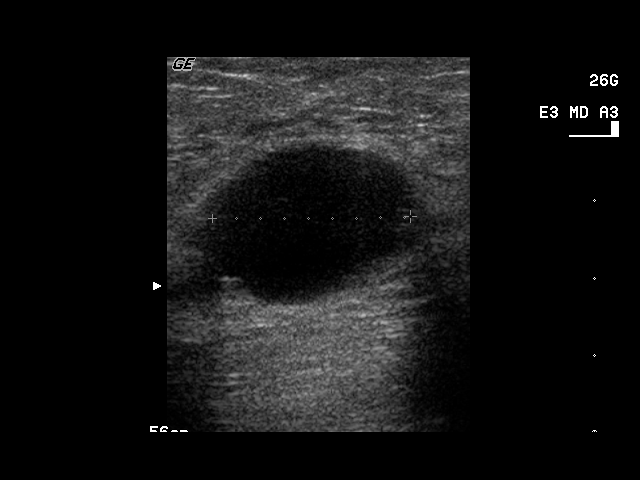

[4 of 4 positions shown; findings below may reference images not displayed]

The left breast parenchyma is extremely dense, which may limit sensitivity of mammography.  Again 
noted are waxing and weaning circumscribed oval and round masses consistent with previously 
evaluated cysts, including one at the 1 o'clock location as indicated by a BB to correspond to the 
patient's questioned palpable finding.

On physical examination, I palpable a smoothly marginated mobile oval mass in the 1 o'clock 
location 3 cm from the nipple.  Ultrasound of this area demonstrates a 2.9 x 2.6 x 2.1 cm simple 
appearing cyst.
IMPRESSION: No evidence for malignancy in the left breast; the questioned palpable finding corresponds to a 
simple appearing cyst.  Screening mammography is recommended in [DATE] to maintain the 
patient's yearly schedule.

ASSESSMENT: Benign - BI-RADS 2

Screening mammogram in 8 months.
,

## 2008-03-17 ENCOUNTER — Encounter: Admission: RE | Admit: 2008-03-17 | Discharge: 2008-03-17 | Payer: Self-pay | Admitting: Obstetrics and Gynecology

## 2008-03-17 IMAGING — MG MM SCREEN MAMMOGRAM BILATERAL
4 series · 4 of 4 positions shown · non-contrast
Comparison: Prior studies.

DG SCREEN MAMMOGRAM BILATERAL
Bilateral CC and MLO view(s) were taken.
Technologist: BANK, RT RM
Prior study comparison: [DATE], DG screen mammogram bilateral, performed at the [REDACTED] at the [REDACTED].

DIGITAL SCREENING MAMMOGRAM WITH CAD:

[R CC]
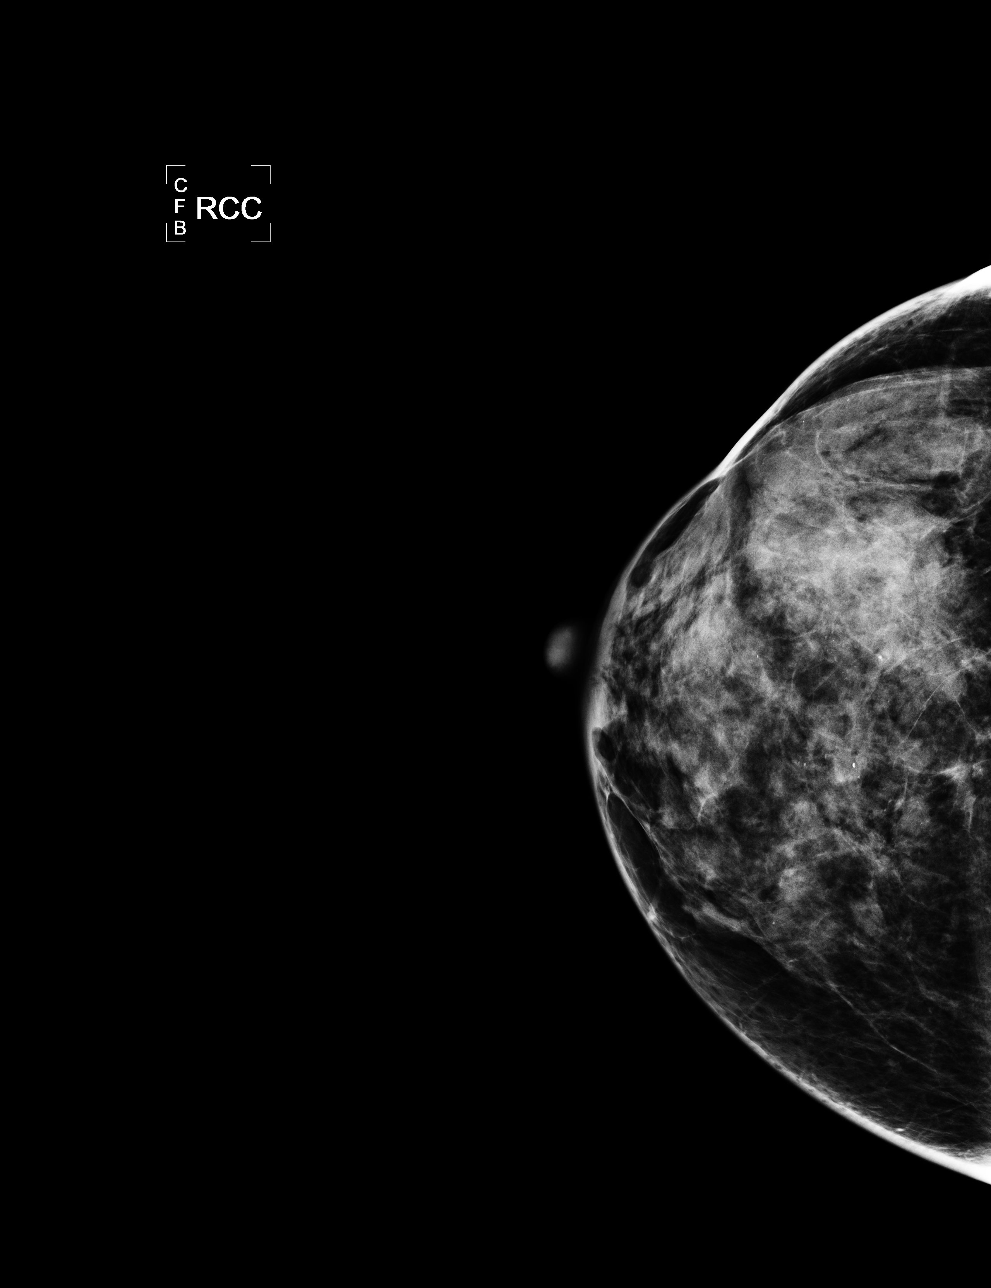

[L CC]
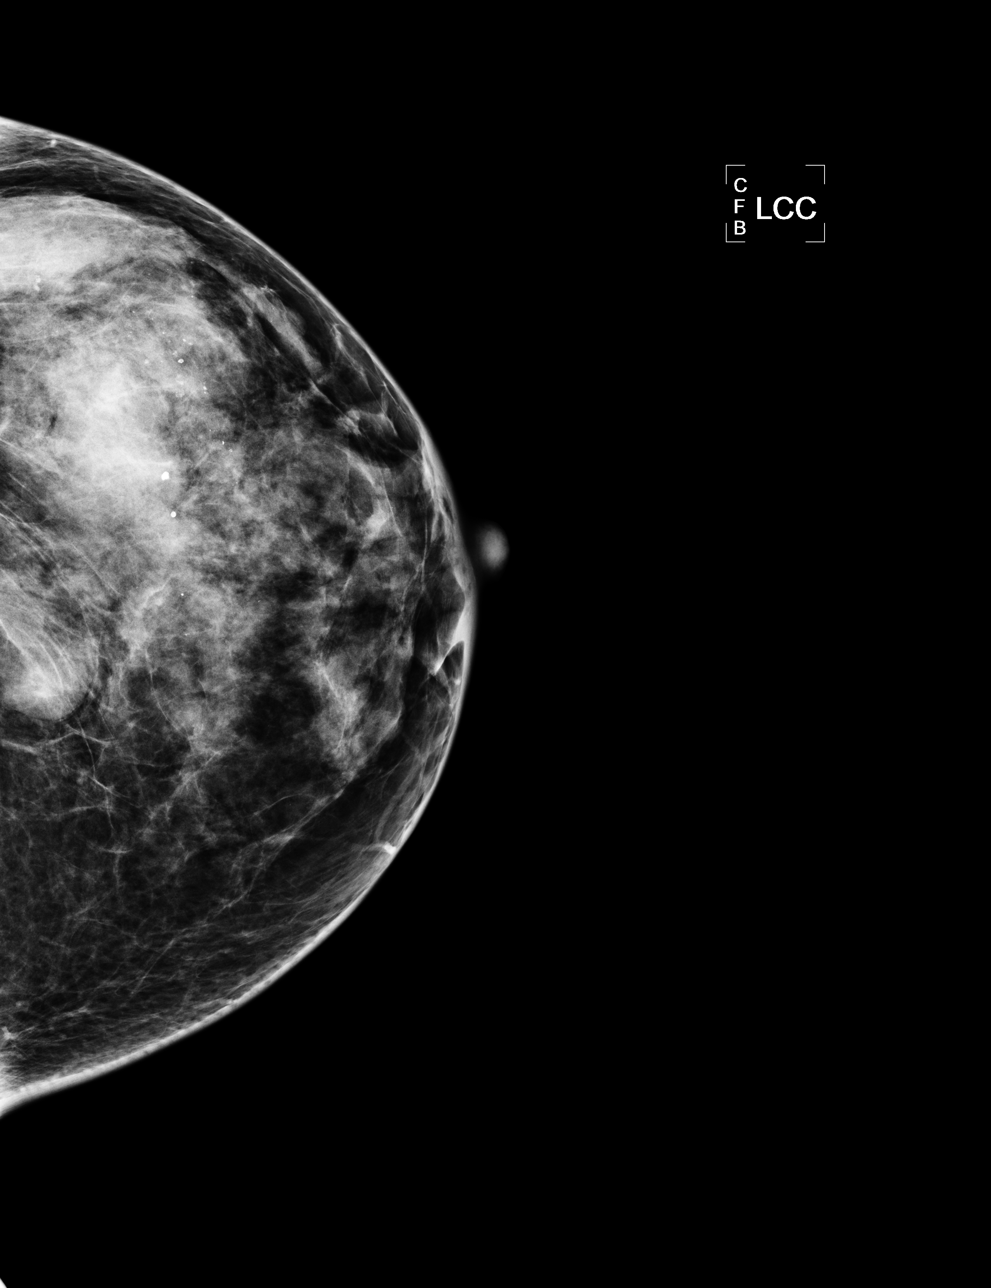

[L MLO]
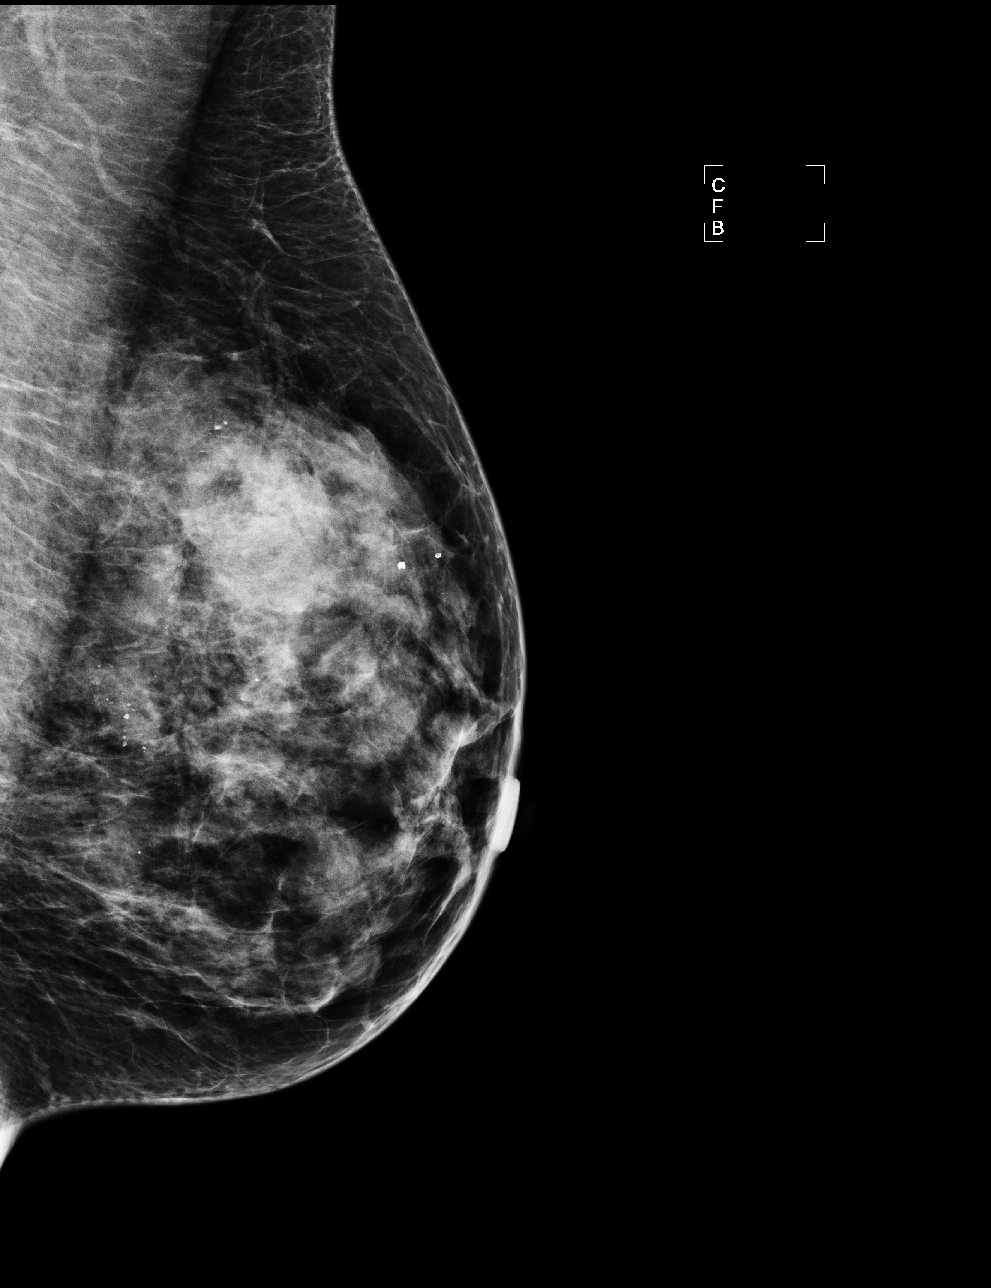

[R MLO]
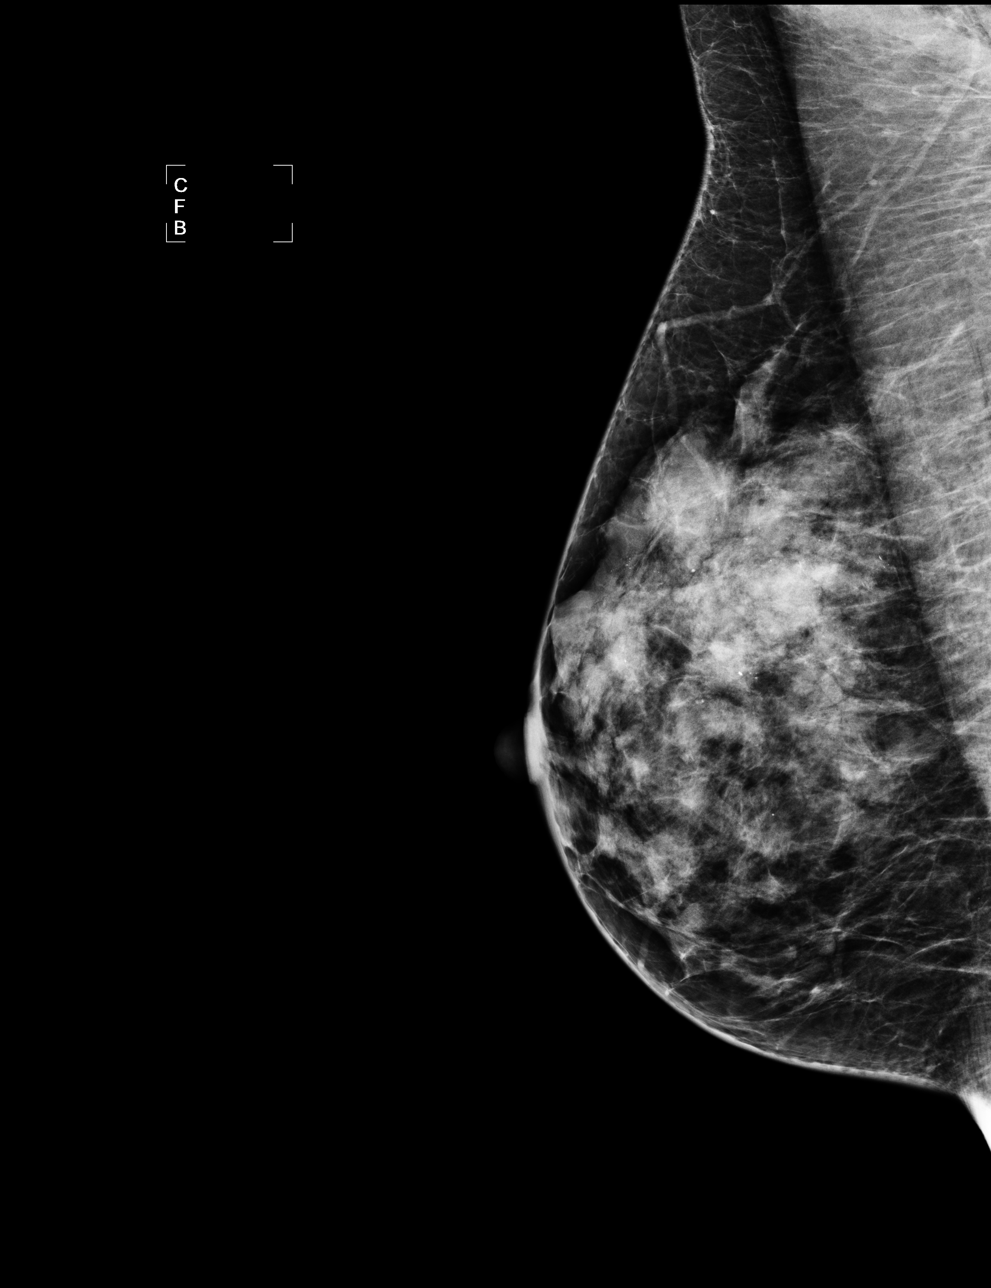

[4 of 4 positions shown; findings below may reference images not displayed]

The breast tissue is heterogeneously dense.  There is no dominant mass, architectural distortion or
calcification to suggest malignancy.
IMPRESSION: No mammographic evidence of malignancy.  Suggest yearly screening mammography.

ASSESSMENT: Negative - BI-RADS 1

Screening mammogram in 1 year.
ANALYZED BY COMPUTER AIDED DETECTION. , THIS PROCEDURE WAS A DIGITAL MAMMOGRAM.

## 2009-03-18 ENCOUNTER — Ambulatory Visit (HOSPITAL_COMMUNITY): Admission: RE | Admit: 2009-03-18 | Discharge: 2009-03-18 | Payer: Self-pay | Admitting: Obstetrics and Gynecology

## 2009-03-18 IMAGING — MG MM DIGITAL SCREENING
4 series · 4 of 4 positions shown · non-contrast
Comparison: none

DG SCREEN MAMMOGRAM BILATERAL
Bilateral CC and MLO view(s) were taken.
Technologist: ADAIR.(ADAIR)(M)

DIGITAL SCREENING MAMMOGRAM WITH CAD:
The breast tissue is heterogeneously dense.  No masses or malignant type calcifications are 
identified.  Compared with prior studies.
Images were processed with CAD.

[R CC]
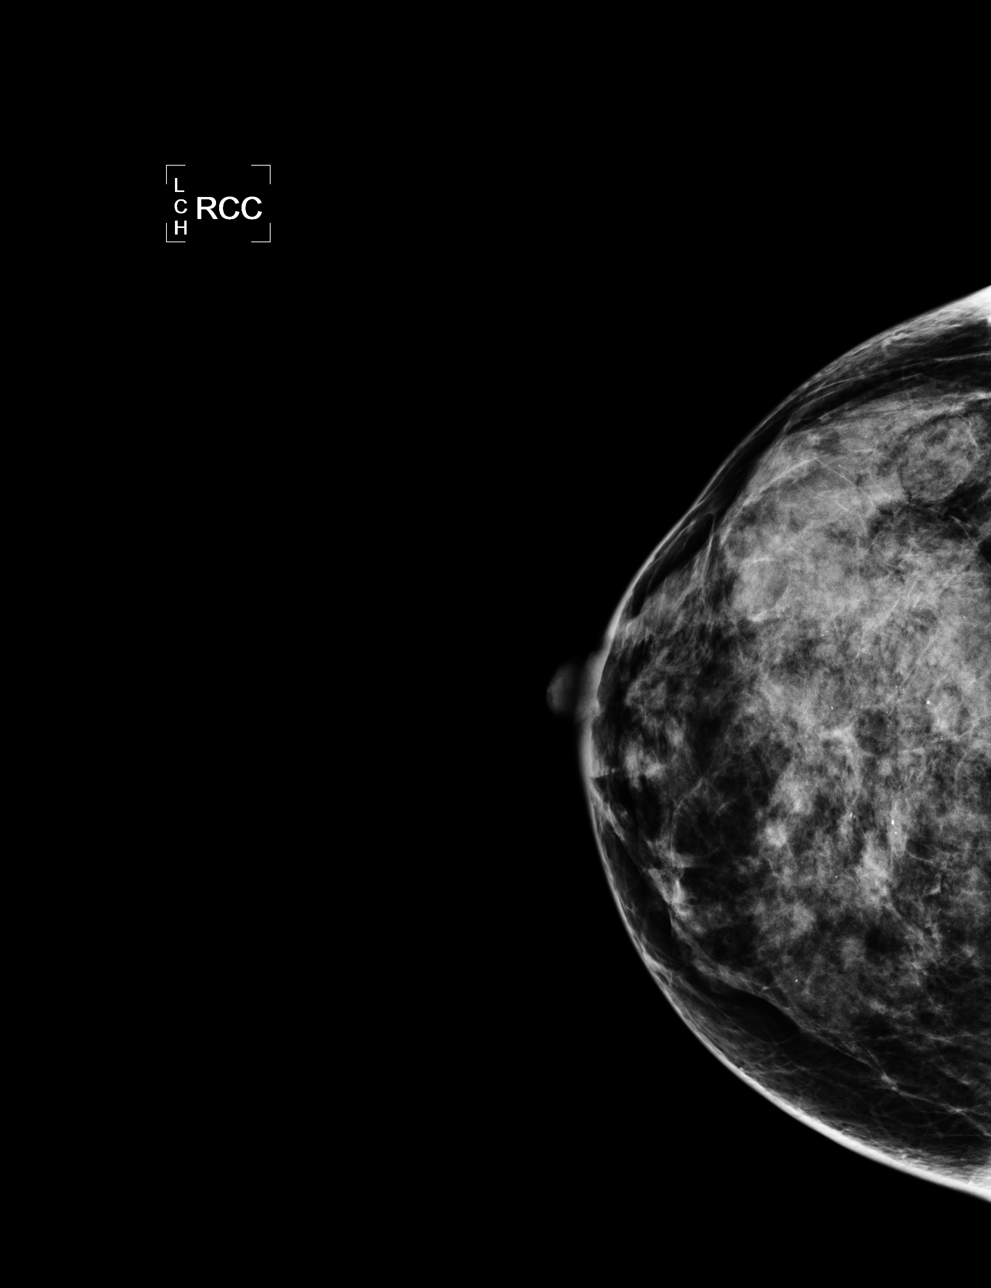

[R MLO]
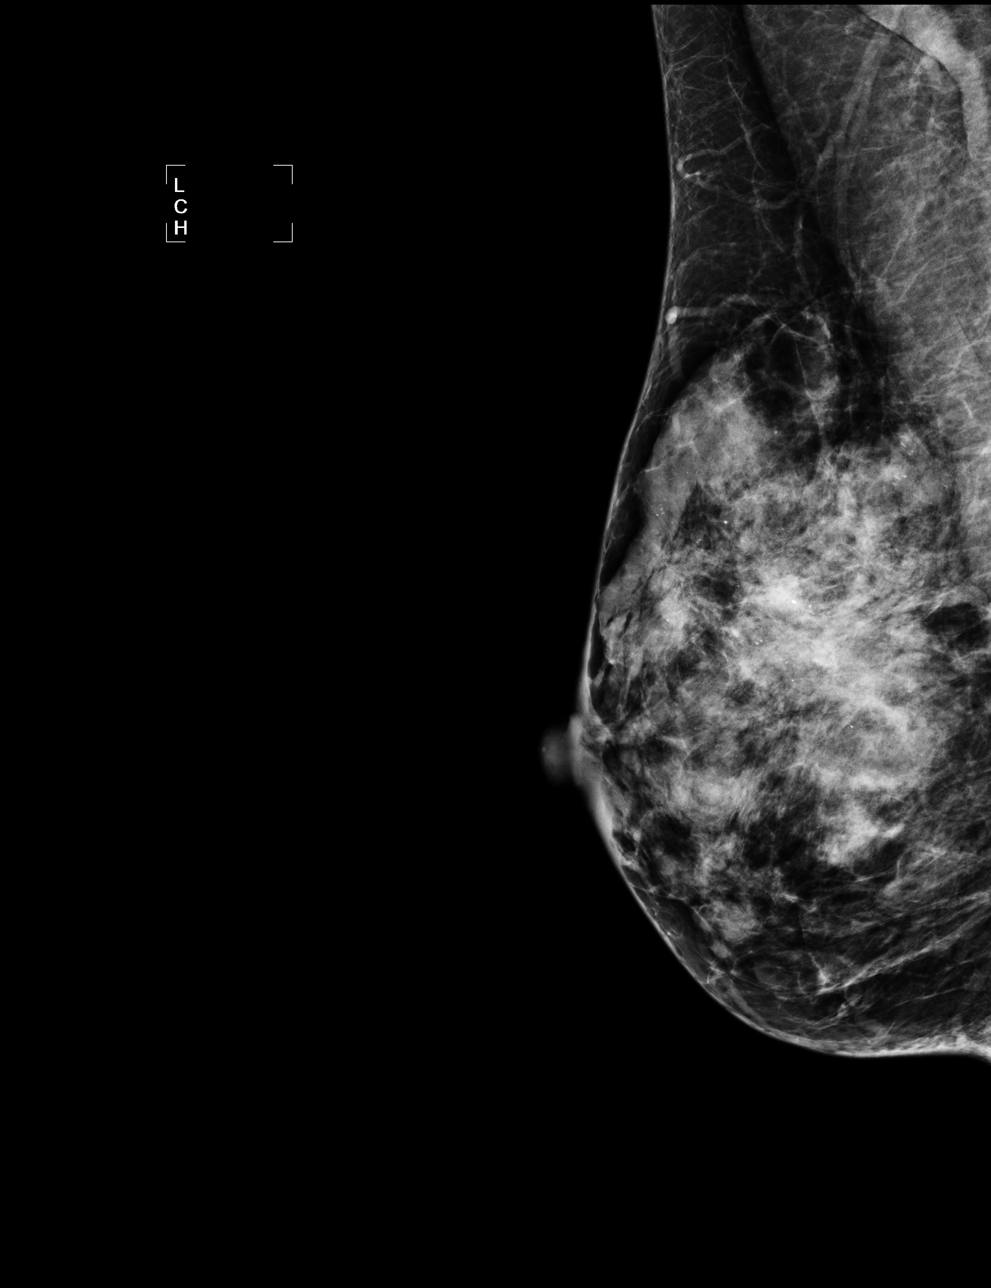

[L CC]
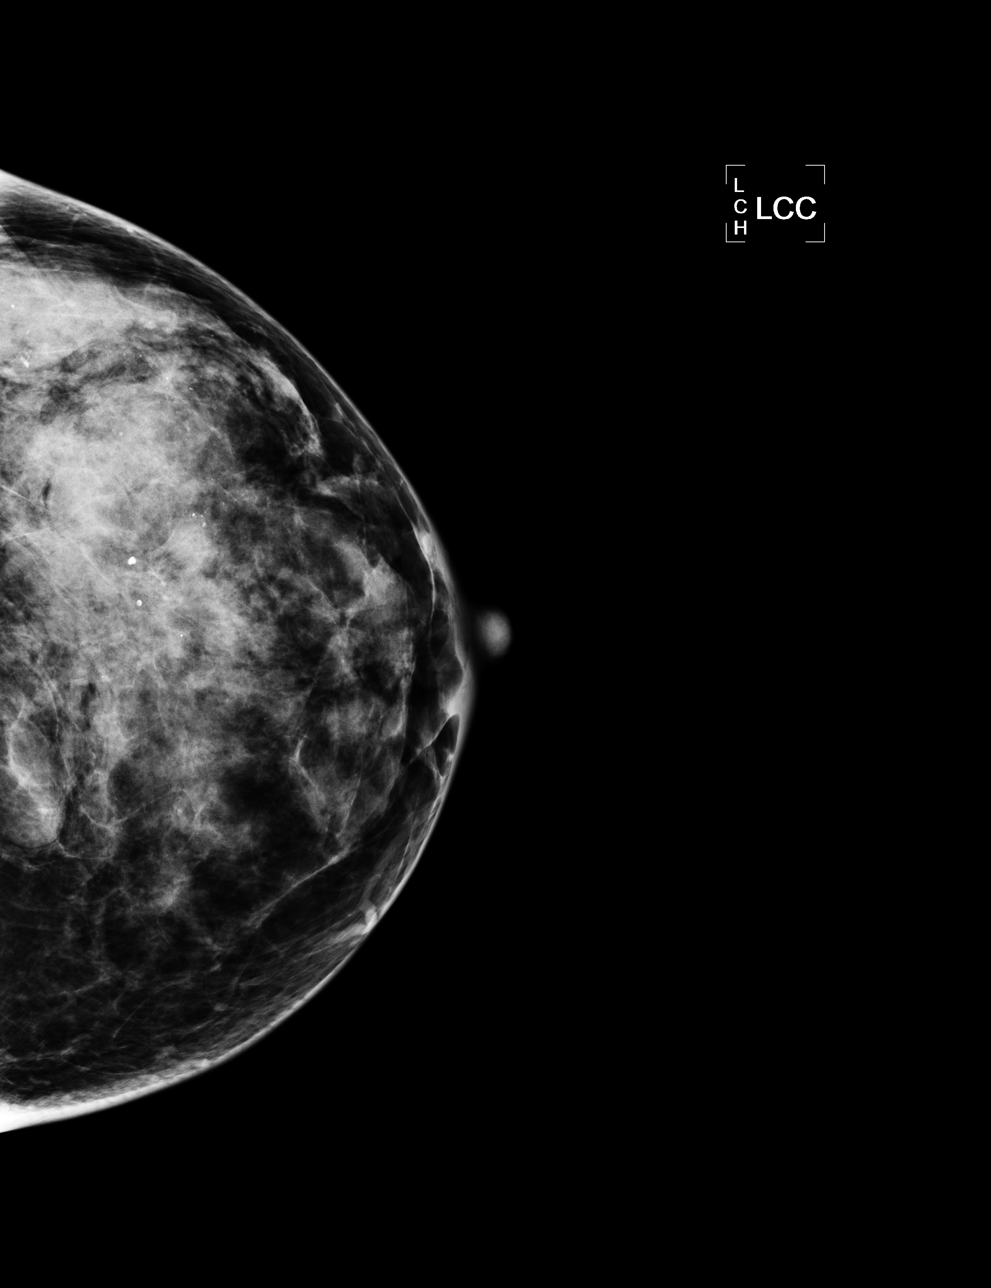

[L MLO]
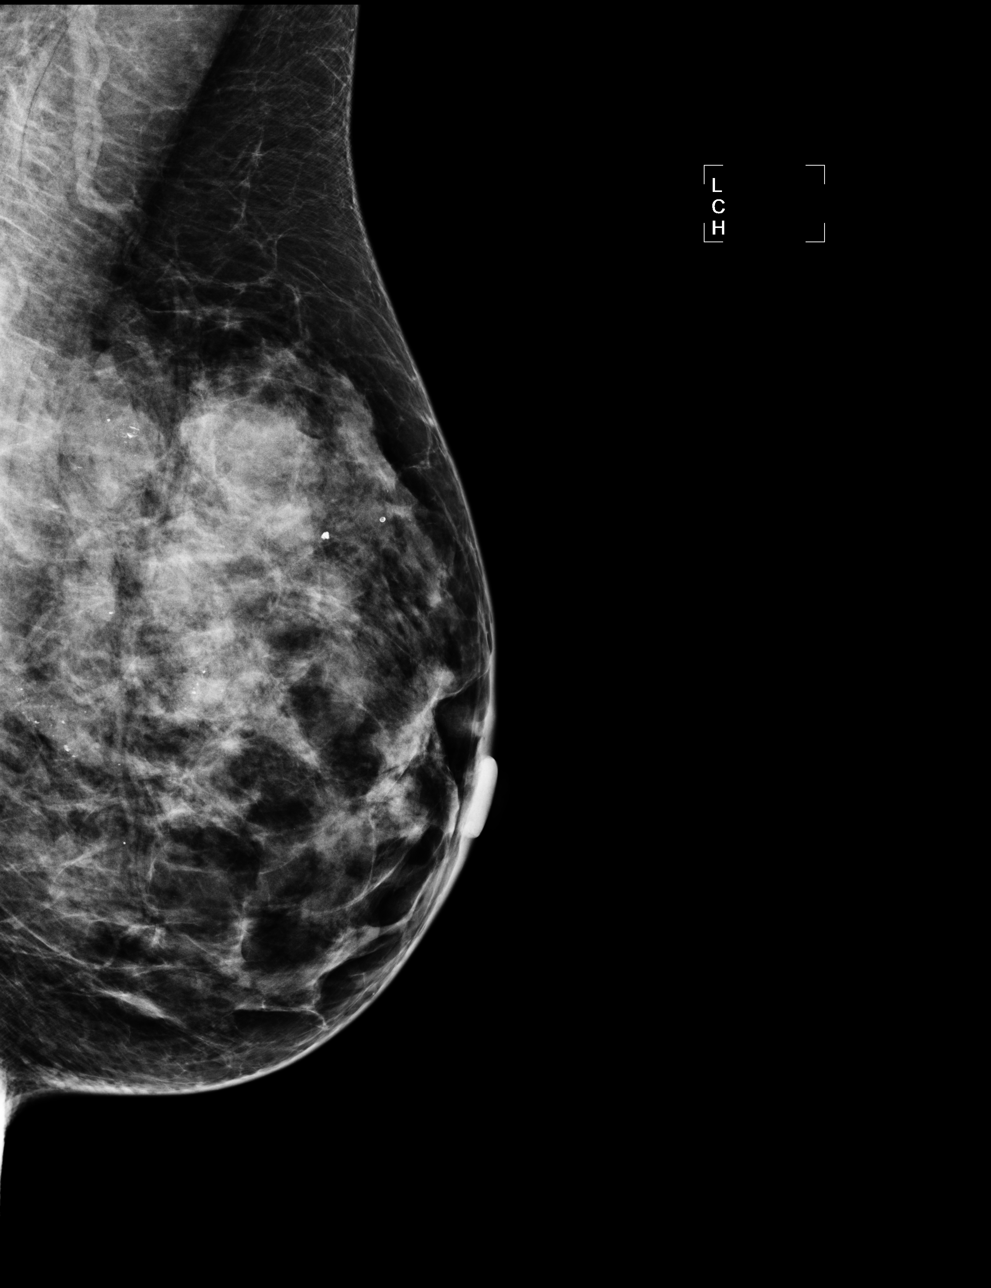

[4 of 4 positions shown; findings below may reference images not displayed]

IMPRESSION: No specific mammographic evidence of malignancy.  Next screening mammogram is recommended in one 
year.

A result letter of this screening mammogram will be mailed directly to the patient.

ASSESSMENT: Negative - BI-RADS 1

Screening mammogram in 1 year.
,

## 2010-04-17 ENCOUNTER — Encounter: Payer: Self-pay | Admitting: Obstetrics and Gynecology

## 2010-06-14 DIAGNOSIS — L930 Discoid lupus erythematosus: Secondary | ICD-10-CM | POA: Insufficient documentation

## 2010-06-14 DIAGNOSIS — M329 Systemic lupus erythematosus, unspecified: Secondary | ICD-10-CM

## 2010-06-14 HISTORY — DX: Systemic lupus erythematosus, unspecified: M32.9

## 2010-06-21 DIAGNOSIS — E039 Hypothyroidism, unspecified: Secondary | ICD-10-CM | POA: Insufficient documentation

## 2010-08-12 NOTE — Op Note (Signed)
NAMEJOHANNE, MCGLADE                         ACCOUNT NO.:  [id number]   MEDICAL RECORD NO.:  [id number]                   PATIENT TYPE:  AMB   LOCATION:  [facility]                                  FACILITY:  [facility]   PHYSICIAN:  Gabrielle Dare. Janee Morn, M.D.             DATE OF BIRTH:  1960/09/16   DATE OF PROCEDURE:  11/20/2002  DATE OF DISCHARGE:                                 OPERATIVE REPORT   PREOPERATIVE DIAGNOSIS:  Mass, posterior neck.   POSTOPERATIVE DIAGNOSIS:  Mass, posterior neck.   OPERATION PERFORMED:  Excision of mass, posterior neck.   SURGEON:  Gabrielle Dare. Janee Morn, M.D.   ANESTHESIA:  MAC plus local.   INDICATIONS FOR PROCEDURE:  The patient is a 50 year old white female with a  history of emphysema, hypothyroidism and vitiligo.  The patient has a  several year history of an enlarging uncomfortable mass at the base of her  neck posteriorly.  She presents for elective excision.   DESCRIPTION OF PROCEDURE:  Informed consent was obtained.  The patient  received intravenous antibiotics.  She was taken to the operating room.  MAC  anesthesia was administered.  She was placed in prone position and the base  of her neck was shaved a couple of centimeters up above the hairline  overlying this mass and was prepped and draped in sterile fashion.  0.25%  Marcaine with epinephrine mixed with 1% lidocaine in a 1:1 mixture was used  for local anesthetic.  A transverse incision was made overlying the 2.5 cm  mass.  Subcutaneous tissue were dissected down revealing the mass which  appeared to be an encapsulated lipoma. This was circumferentially dissected  with assistance of Bovie cautery and extended down to the fascia.  The  fascia was not violated. It was dissected free off the fascia and removed in  two separate pieces.  It was a bit friable.  This was removed.  The wound  was copiously irrigated.  Hemostasis was obtained with the Bovie cautery.  Some more local anesthetic was  injected.  The wound was checked for bleeding  and noted to be dry and it was closed in two layers in the following  fashion.  After irrigating the wound the subcutaneous tissues were brought  together with interrupted 3-0 Vicryl sutures and the skin was closed with  running 4-0  Vicryl subcuticular stitch.  Sponge, needle and instrument  counts were correct.  Benzoin, Steri-Strips and sterile dressings were  applied.  The patient tolerated the procedure well without apparent  complications and was taken to recovery room in stable condition.                                                 Gabrielle Dare Janee Morn, M.D.    BET/MEDQ  D:  11/20/2002  T:  11/20/2002  Job:  [id number]

## 2010-10-11 DIAGNOSIS — E559 Vitamin D deficiency, unspecified: Secondary | ICD-10-CM

## 2010-10-11 HISTORY — DX: Vitamin D deficiency, unspecified: E55.9

## 2011-03-31 ENCOUNTER — Other Ambulatory Visit: Payer: Self-pay | Admitting: Obstetrics and Gynecology

## 2011-03-31 DIAGNOSIS — N6009 Solitary cyst of unspecified breast: Secondary | ICD-10-CM

## 2011-04-17 ENCOUNTER — Ambulatory Visit
Admission: RE | Admit: 2011-04-17 | Discharge: 2011-04-17 | Disposition: A | Discharge status: Home / Self Care | Payer: BC Managed Care – PPO | Source: Ambulatory Visit | Attending: Obstetrics and Gynecology | Admitting: Obstetrics and Gynecology

## 2011-04-17 ENCOUNTER — Other Ambulatory Visit: Payer: Self-pay | Admitting: Obstetrics and Gynecology

## 2011-04-17 DIAGNOSIS — N6009 Solitary cyst of unspecified breast: Secondary | ICD-10-CM

## 2011-04-17 IMAGING — US US BREAST LEFT
1 series · 13 of 14 positions shown · non-contrast
Comparison: [DATE], [DATE], [DATE], [DATE],
[DATE], [DATE] mammograms from [facility]
[[facility]].

CLINICAL DATA: The patient is referred for 6-month follow-up
evaluation of a probably benign left breast mass/complicated cyst.
She was previously evaluated in [facility] [[facility]].

DIGITAL DIAGNOSTIC LEFT BREAST MAMMOGRAM WITH CAD AND LEFT BREAST
ULTRASOUND:

[Series 1: us breast left · 13 of 14 slices shown]
[im 1/14]
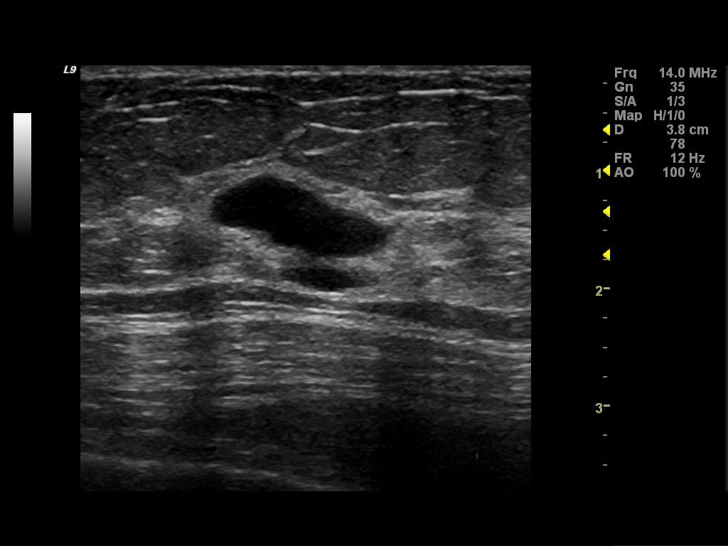
[im 2/14]
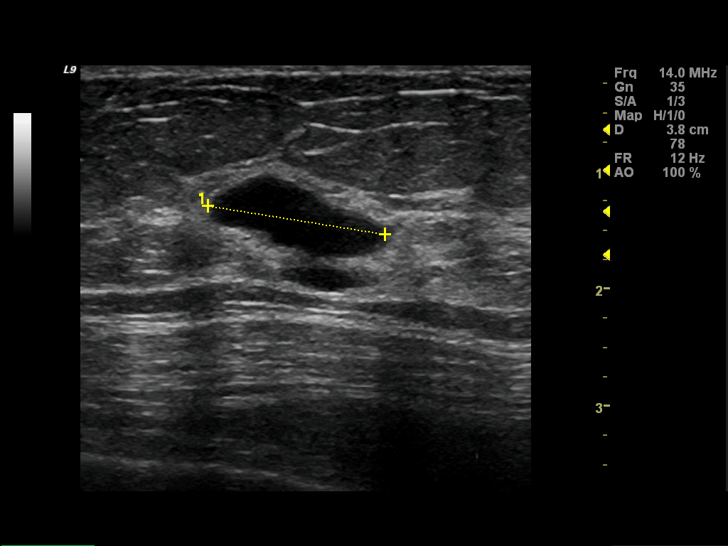
[im 3/14]
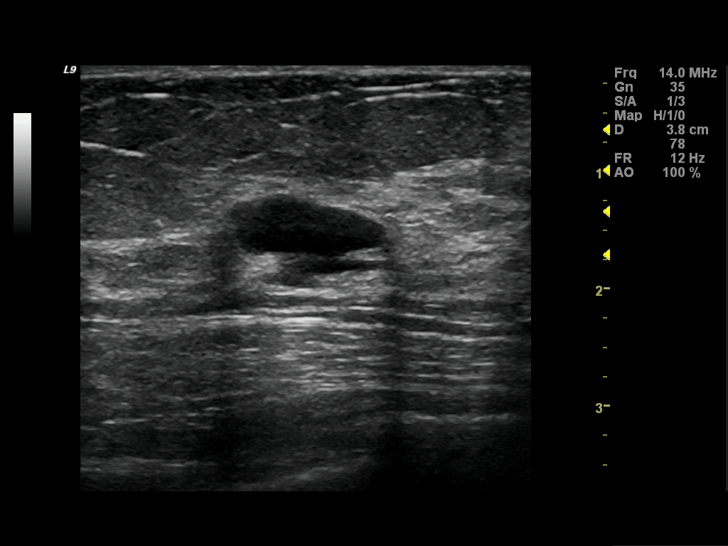
[im 4/14]
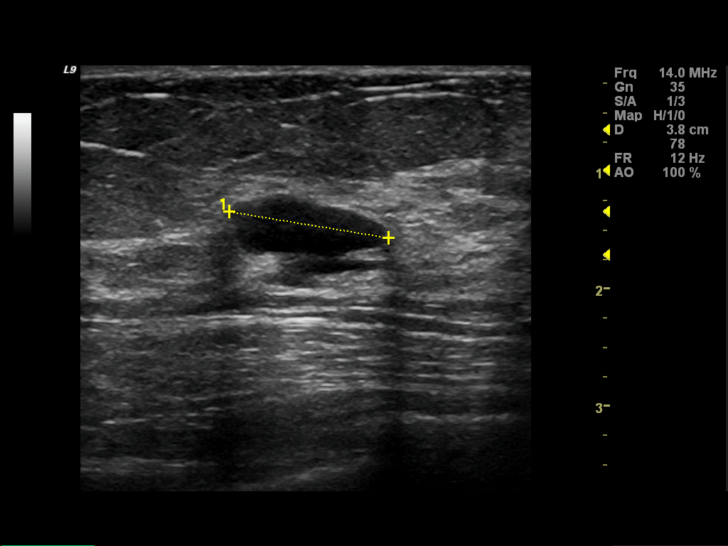
[im 5/14]
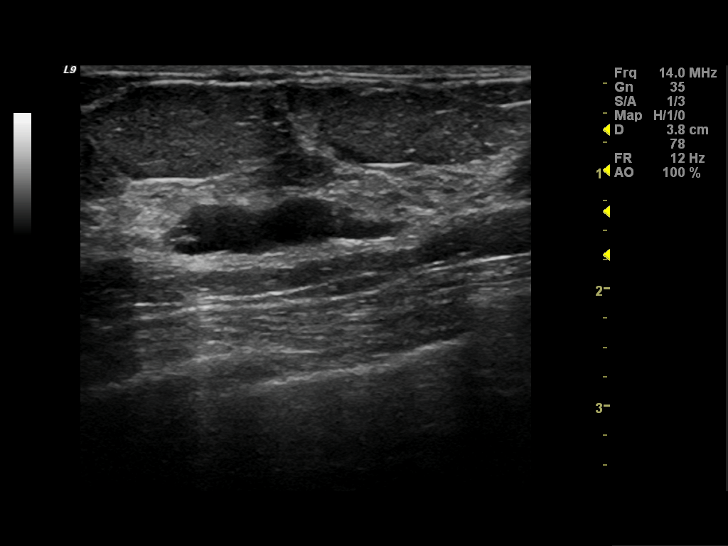
[im 6/14]
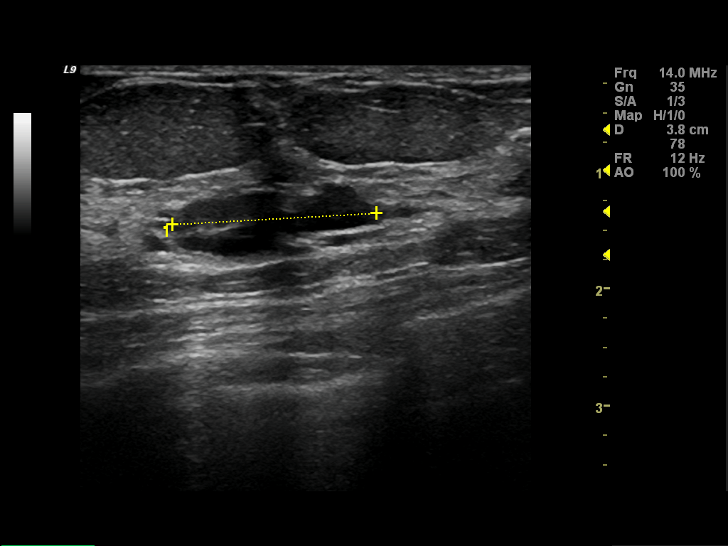
[im 8/14]
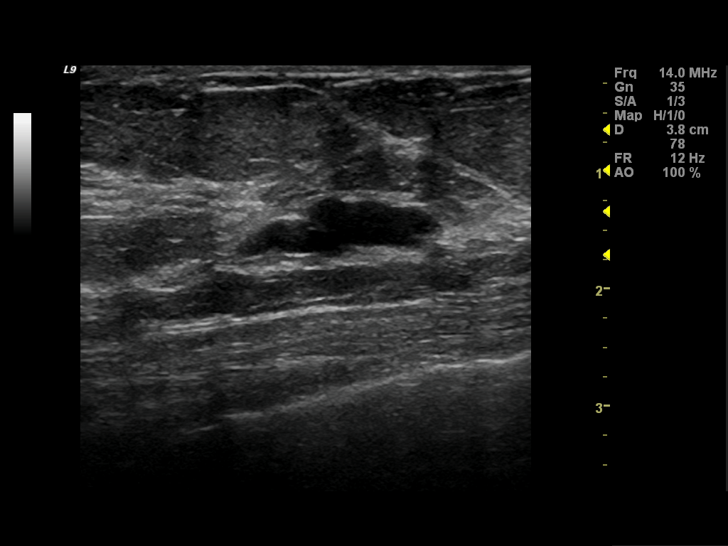
[im 9/14]
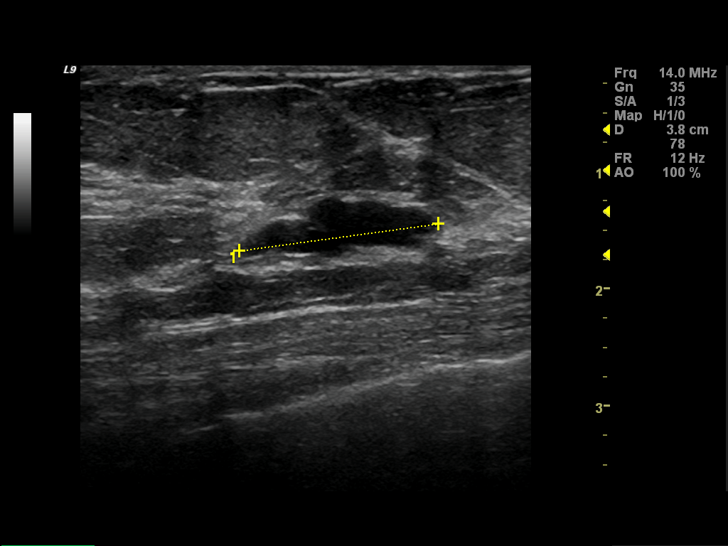
[im 10/14]
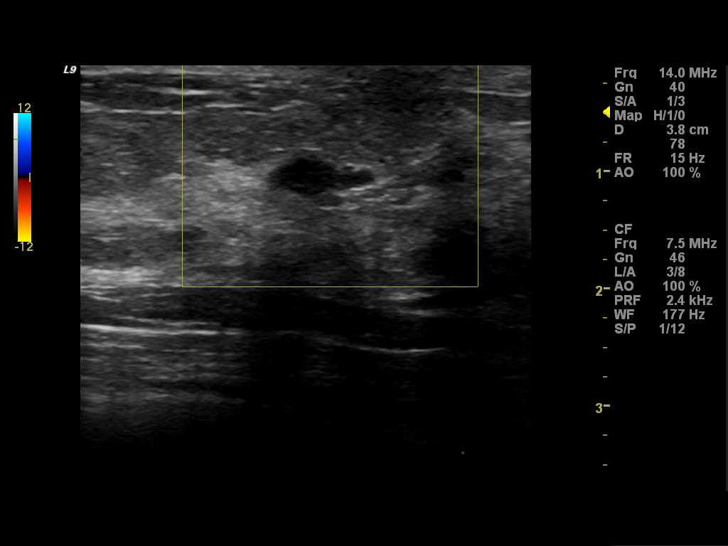
[im 11/14]
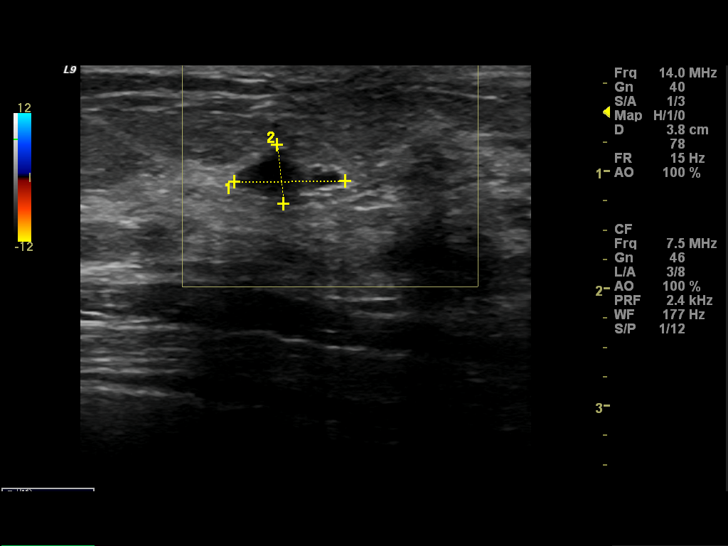
[im 12/14]
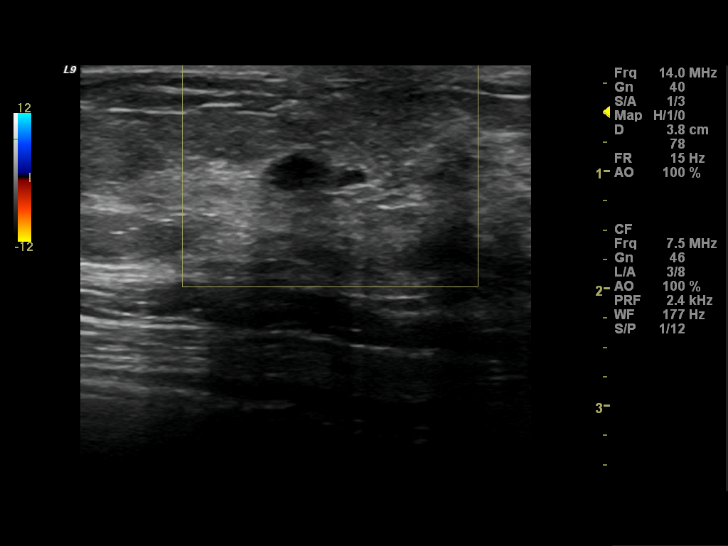
[im 13/14]
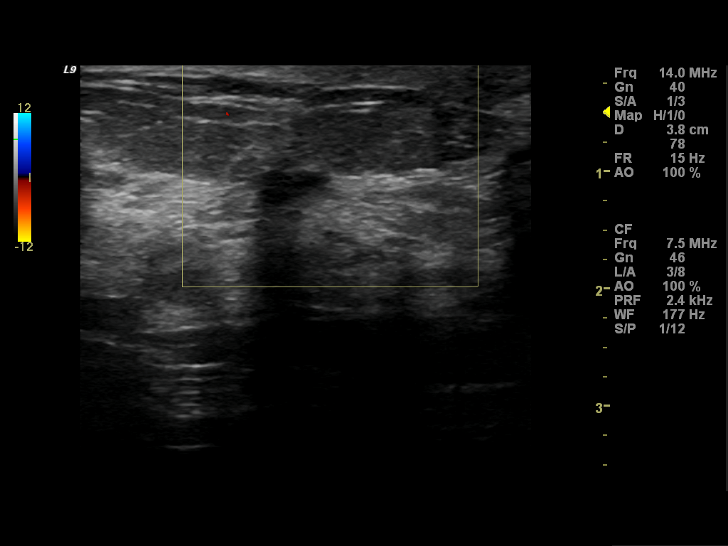
[im 14/14]
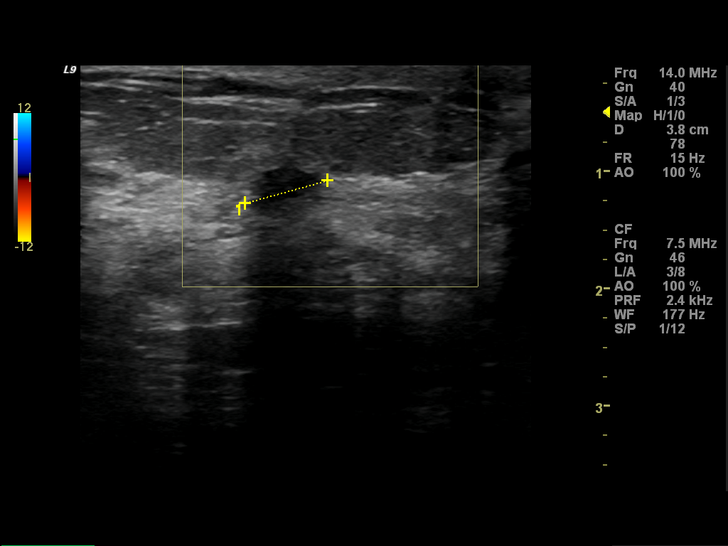

[13 of 14 positions shown; findings below may reference images not displayed]

FINDINGS: There is a heterogeneously dense parenchymal pattern.
There is an oval, circumscribed mass within the lower inner
quadrant of the left breast.  There are benign appearing dystrophic
calcifications noted within the upper-outer quadrant left breast.
There is no worrisome mass, distortion, or worrisome calcification.
Mammographic images were processed with CAD.

On physical exam, there is no discrete palpable abnormality within
the left breast.

Ultrasound is performed, showing an oval, simple cyst located
within the left breast at the 7 o'clock position 2 cm from nipple
measuring 2.7 cm in size and corresponding to the circumscribed
mass seen in this region on mammography.  Also, there is an oval,
simple cyst located within the left breast at the 10 o'clock
position 5 cm from the nipple measuring 1.5 cm in size.

The mass previously described within the left breast at the 11
o'clock position 2 cm from the nipple measures 9 x 5 x 7 mm in size
and does contain low-level internal echoes and mild shadowing.  The
margins are mildly irregular.  Given the ultrasound features,
tissue sampling is recommended and I have discussed ultrasound-
guided core biopsy of this lesion with the patient.  This will be
scheduled per patient preference.
IMPRESSION: 1.  9 mm hypoechoic mass versus complicated cyst located within the
left breast at the 11 o'clock position 2 cm from the nipple.
Tissue sampling is recommended and ultrasound-guided core biopsy
will be scheduled.
2.  Simple cyst located within the left breast at the 7 o'clock and
10 o'clock positions as discussed above.

BI-RADS CATEGORY 4:  Suspicious abnormality - biopsy should be
considered.

## 2011-04-17 IMAGING — MG MM DIGITAL DIAGNOSTIC UNILAT*L*
3 series · 3 of 3 positions shown · non-contrast
Comparison: [DATE], [DATE], [DATE], [DATE],
[DATE], [DATE] mammograms from [facility]
[[facility]].

CLINICAL DATA: The patient is referred for 6-month follow-up
evaluation of a probably benign left breast mass/complicated cyst.
She was previously evaluated in [facility] [[facility]].

DIGITAL DIAGNOSTIC LEFT BREAST MAMMOGRAM WITH CAD AND LEFT BREAST
ULTRASOUND:

[L CC (1 of 2)]
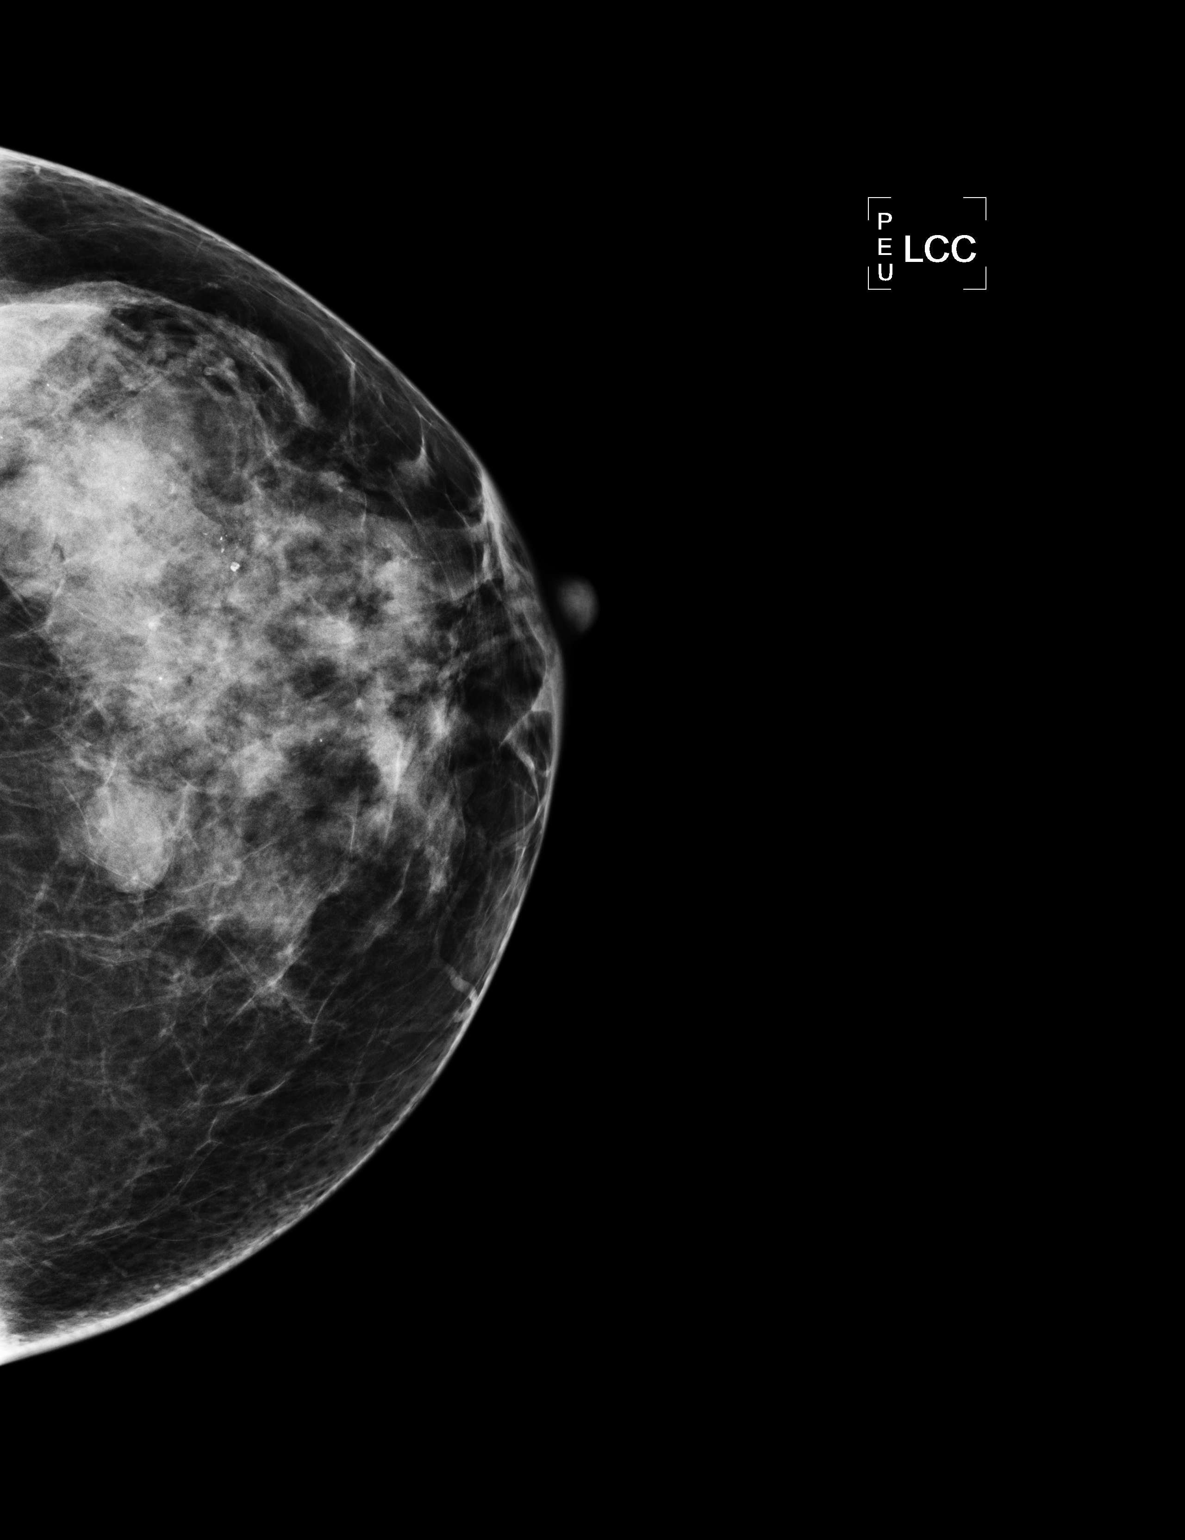

[L MLO]
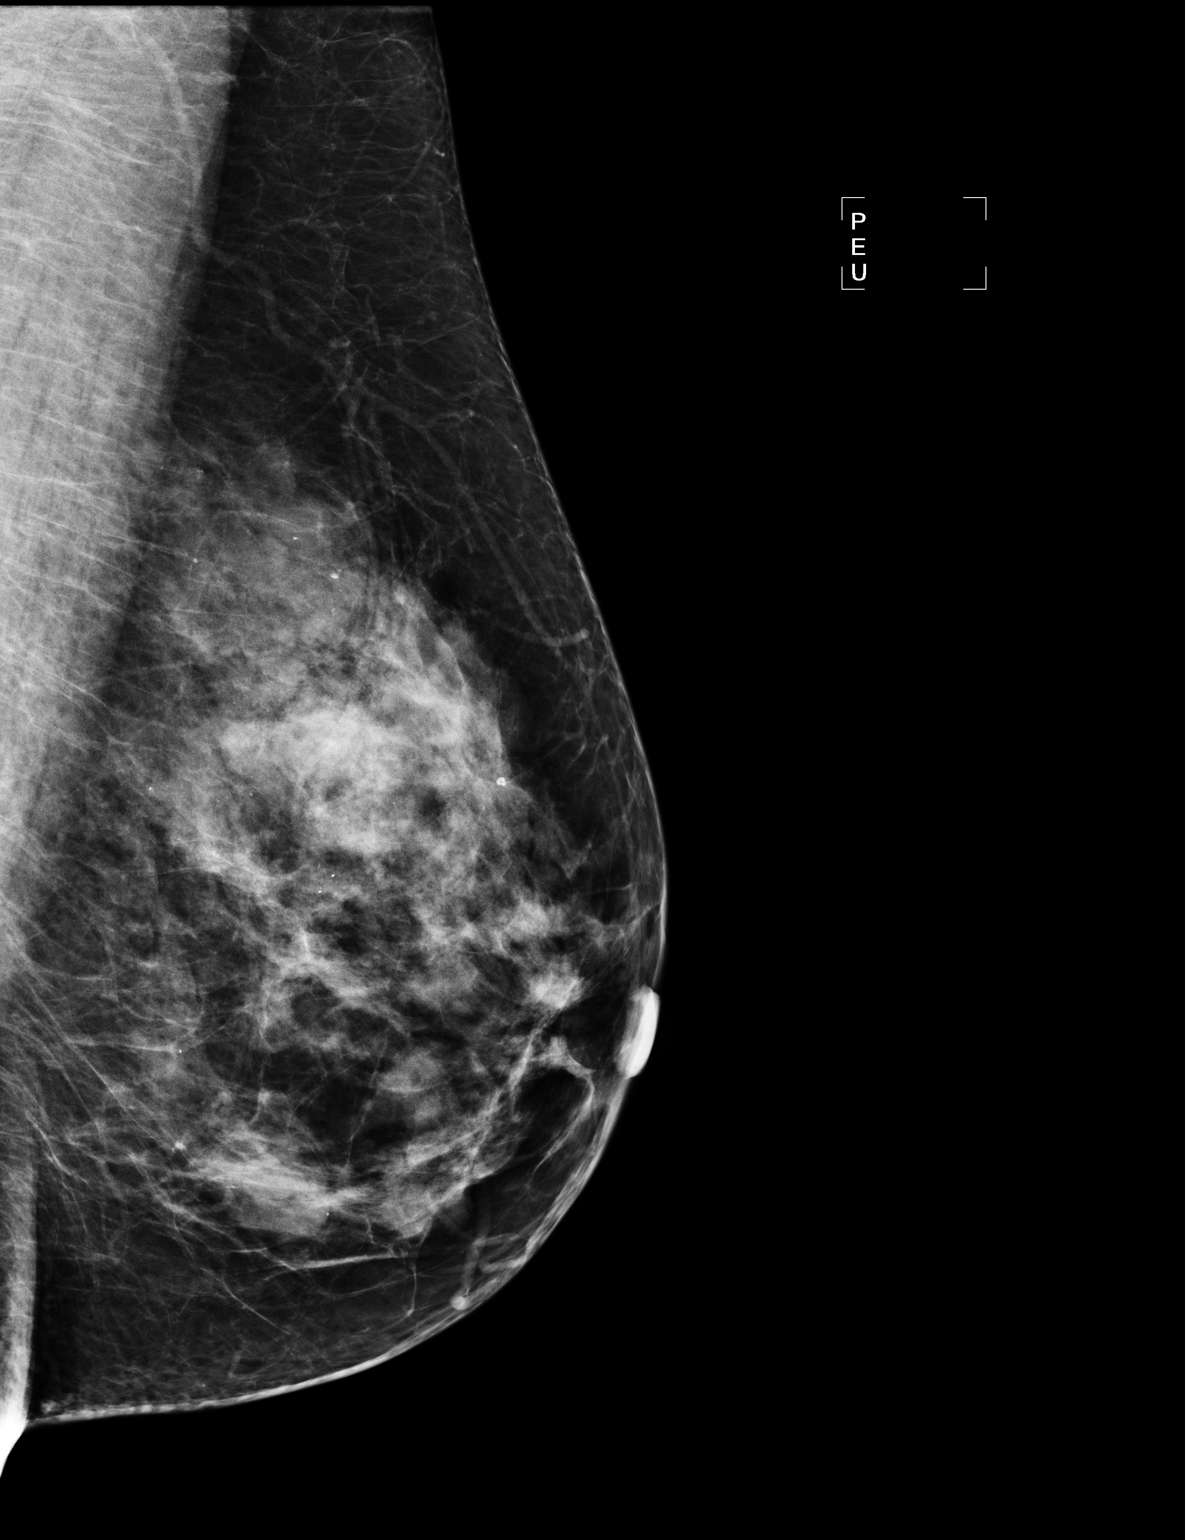

[L CC (2 of 2)]
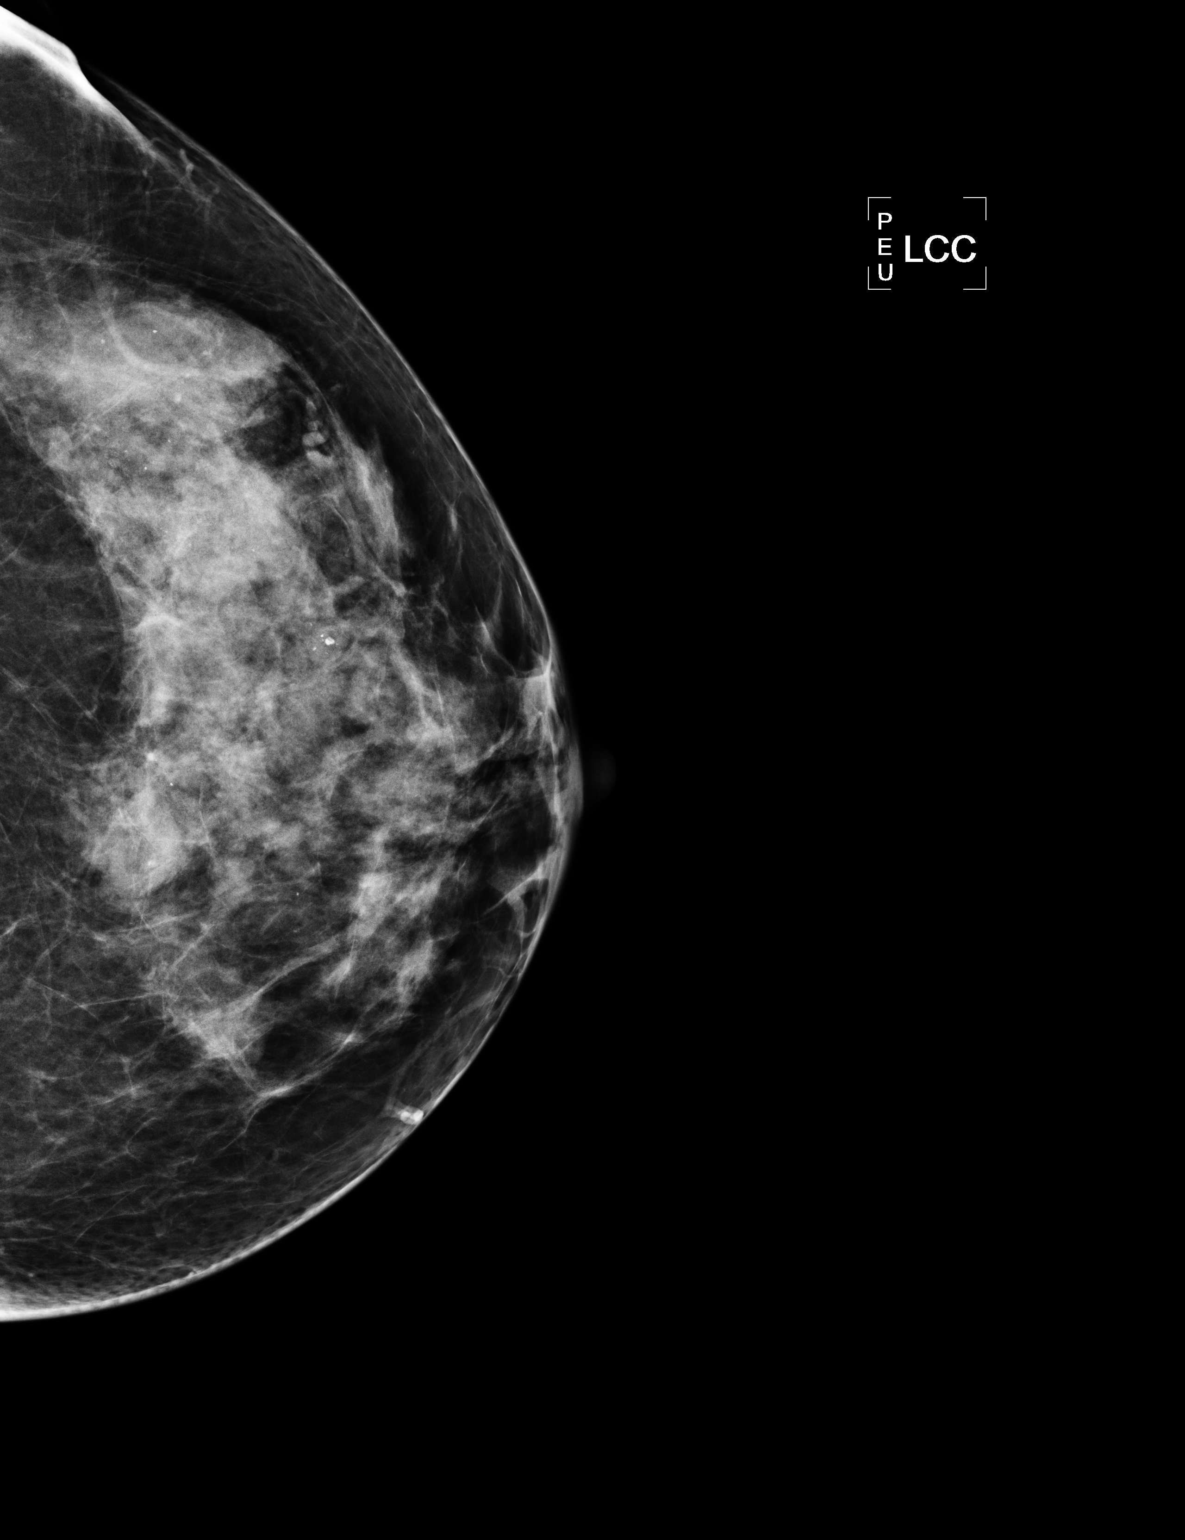

[3 of 3 positions shown; findings below may reference images not displayed]

FINDINGS: There is a heterogeneously dense parenchymal pattern.
There is an oval, circumscribed mass within the lower inner
quadrant of the left breast.  There are benign appearing dystrophic
calcifications noted within the upper-outer quadrant left breast.
There is no worrisome mass, distortion, or worrisome calcification.
Mammographic images were processed with CAD.

On physical exam, there is no discrete palpable abnormality within
the left breast.

Ultrasound is performed, showing an oval, simple cyst located
within the left breast at the 7 o'clock position 2 cm from nipple
measuring 2.7 cm in size and corresponding to the circumscribed
mass seen in this region on mammography.  Also, there is an oval,
simple cyst located within the left breast at the 10 o'clock
position 5 cm from the nipple measuring 1.5 cm in size.

The mass previously described within the left breast at the 11
o'clock position 2 cm from the nipple measures 9 x 5 x 7 mm in size
and does contain low-level internal echoes and mild shadowing.  The
margins are mildly irregular.  Given the ultrasound features,
tissue sampling is recommended and I have discussed ultrasound-
guided core biopsy of this lesion with the patient.  This will be
scheduled per patient preference.
IMPRESSION: 1.  9 mm hypoechoic mass versus complicated cyst located within the
left breast at the 11 o'clock position 2 cm from the nipple.
Tissue sampling is recommended and ultrasound-guided core biopsy
will be scheduled.
2.  Simple cyst located within the left breast at the 7 o'clock and
10 o'clock positions as discussed above.

BI-RADS CATEGORY 4:  Suspicious abnormality - biopsy should be
considered.

## 2011-04-25 ENCOUNTER — Other Ambulatory Visit: Payer: BC Managed Care – PPO

## 2011-05-16 ENCOUNTER — Ambulatory Visit
Admission: RE | Admit: 2011-05-16 | Discharge: 2011-05-16 | Disposition: A | Discharge status: Home / Self Care | Payer: BC Managed Care – PPO | Source: Ambulatory Visit | Attending: Obstetrics and Gynecology | Admitting: Obstetrics and Gynecology

## 2011-05-16 DIAGNOSIS — N6009 Solitary cyst of unspecified breast: Secondary | ICD-10-CM

## 2011-05-16 IMAGING — US US CORE BIOPSY
1 series · 13 of 13 positions shown · non-contrast
Comparison: Prior studies
COMPARISON: Prior studies

<!--  IDXRADR:ADDEND:BEGIN -->Addendum Begins
<!--  IDXRADR:ADDEND:INNER_BEGIN -->***ADDENDUM*** CREATED: [DATE] [DATE]

Pathology revealed fibrocystic changes with adenosis and
microcalcifications in the left breast. This was found to be
concordant by Dr. LELIEVRE. Pathology was relayed by
telephone. The patient reported minimal tenderness at the biopsy
site. Post biopsy instructions were reviewed and her questions were
answered. She was encouraged to call The [REDACTED] for any additional concerns. She was asked to
return in [DATE] for bilateral screening mammography.
Pathology results are dictated by LELIEVRE RN, BSN on LELIEVRE
Addended by:  LELIEVRE, M.D. on [DATE] [DATE].
***END ADDENDUM*** SIGNED BY: LELIEVRE, M.D.
CLINICAL DATA: Left breast mass.
ULTRASOUND GUIDED CORE BIOPSY OF THE LEFT BREAST

[Series 2: us core biopsy · 13 of 13 slices shown]
[im 1/13]
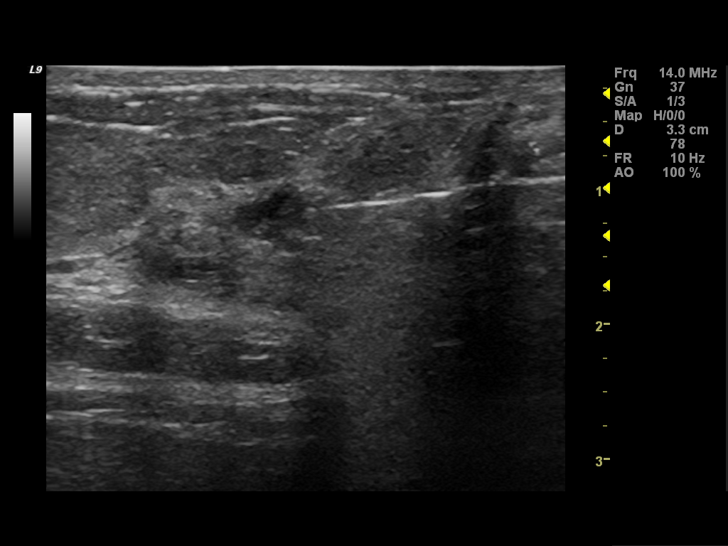
[im 2/13]
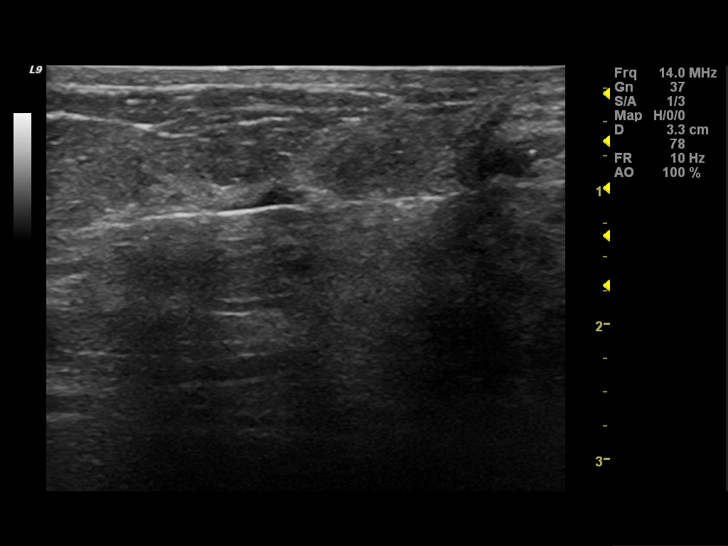
[im 3/13]
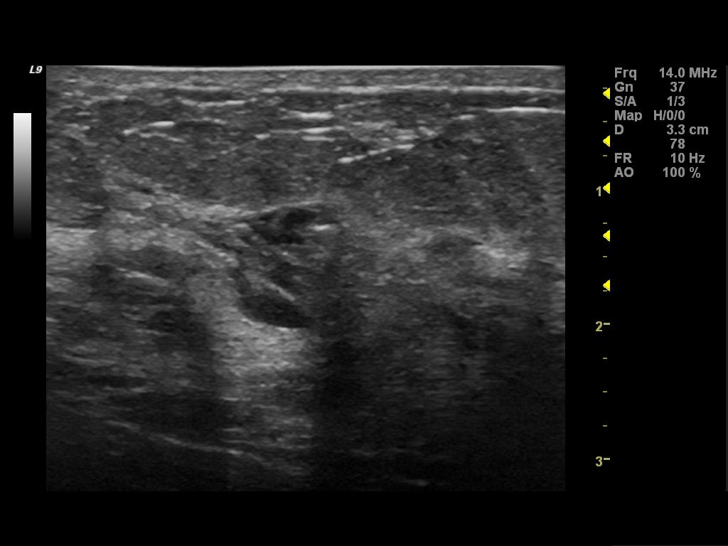
[im 4/13]
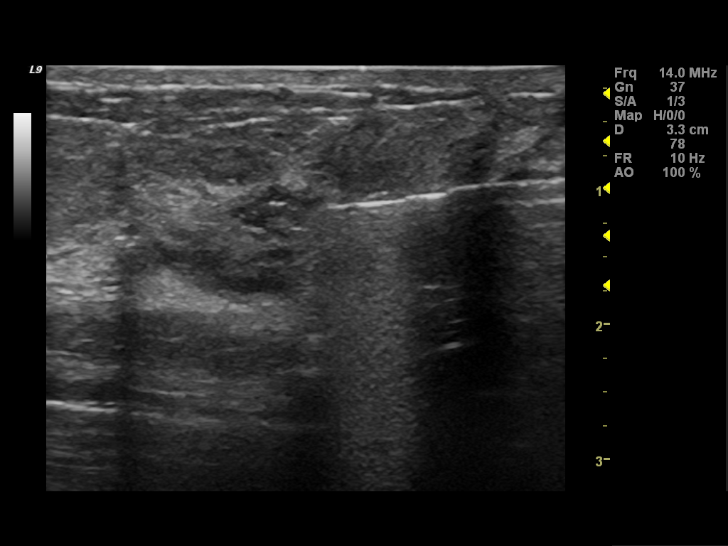
[im 5/13]
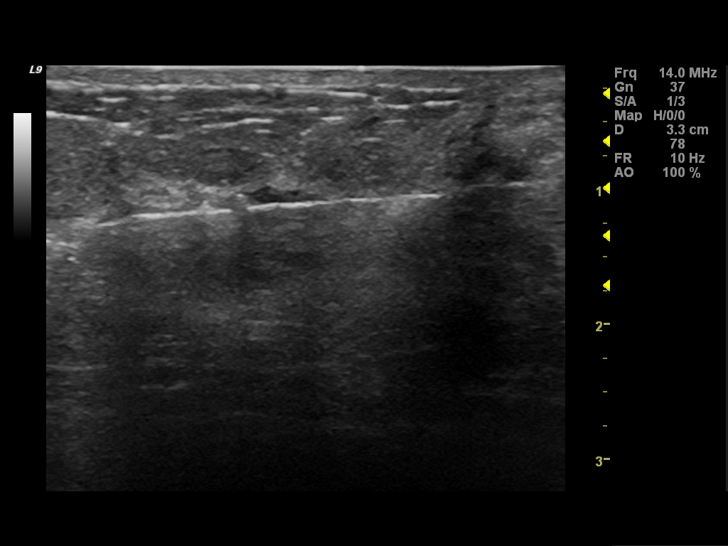
[im 6/13]
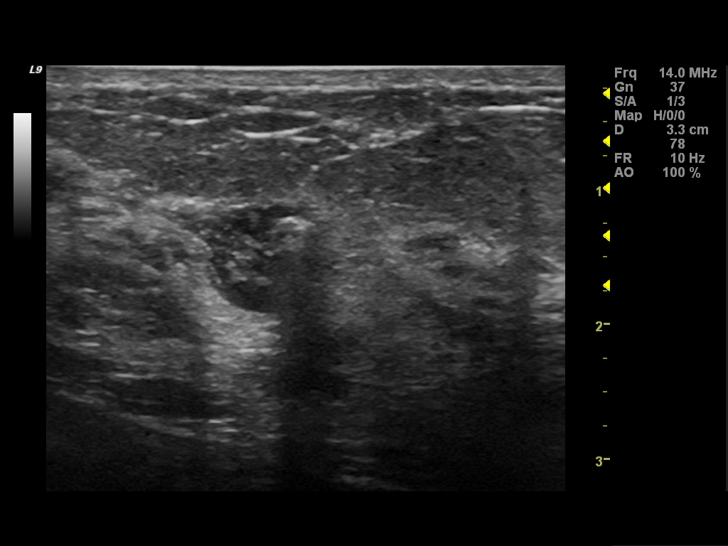
[im 7/13]
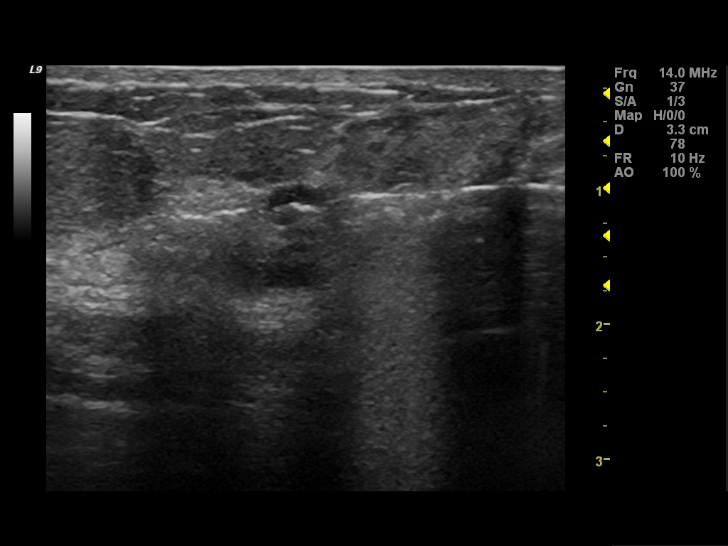
[im 8/13]
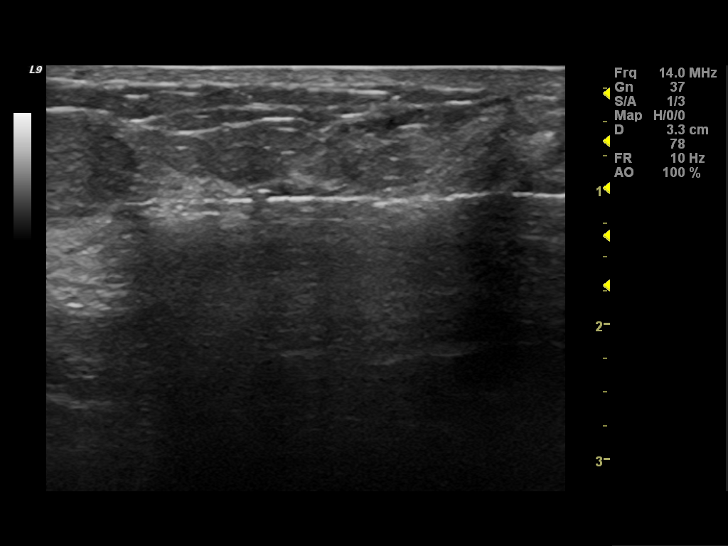
[im 9/13]
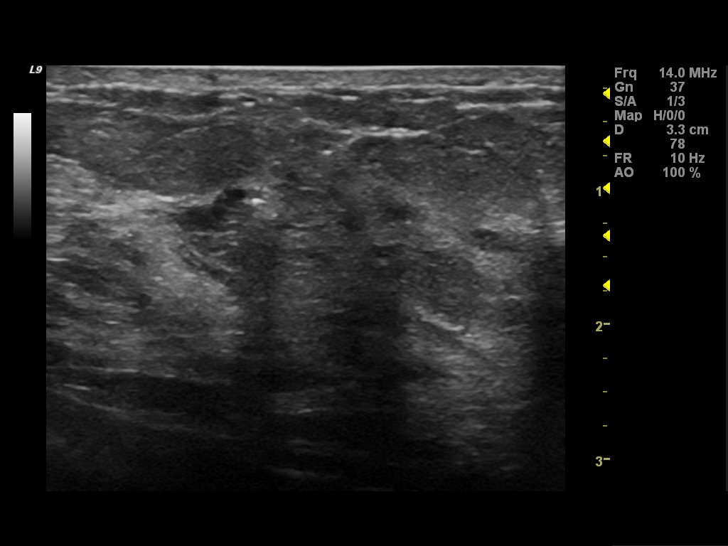
[im 10/13]
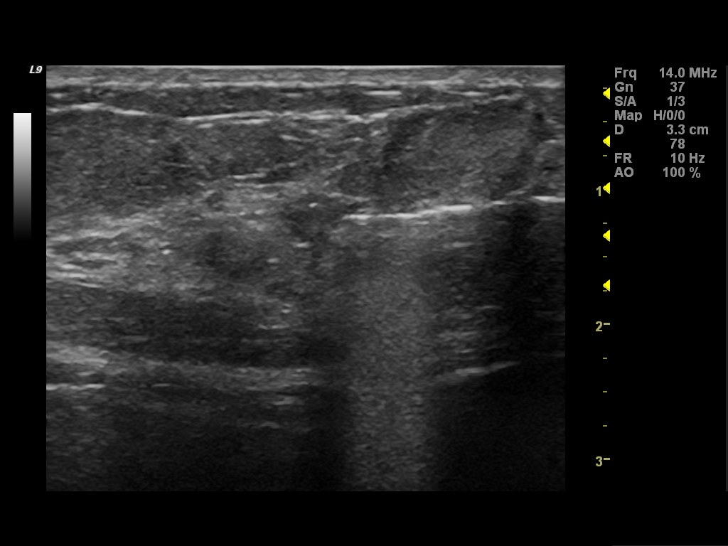
[im 11/13]
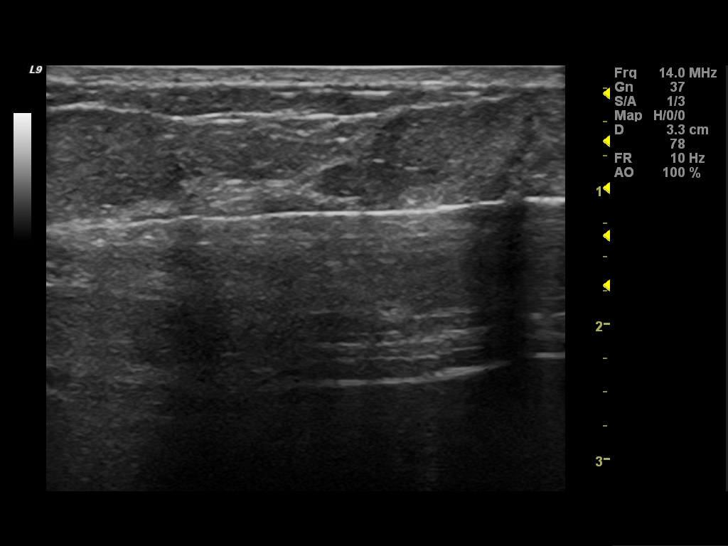
[im 12/13]
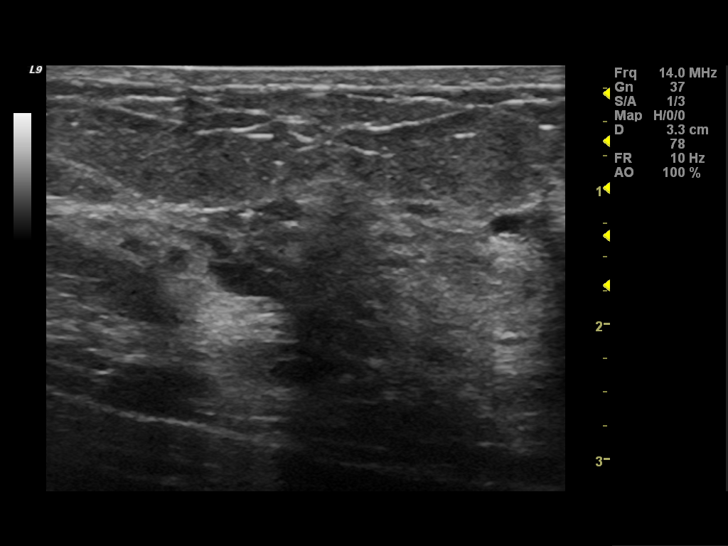
[im 13/13]
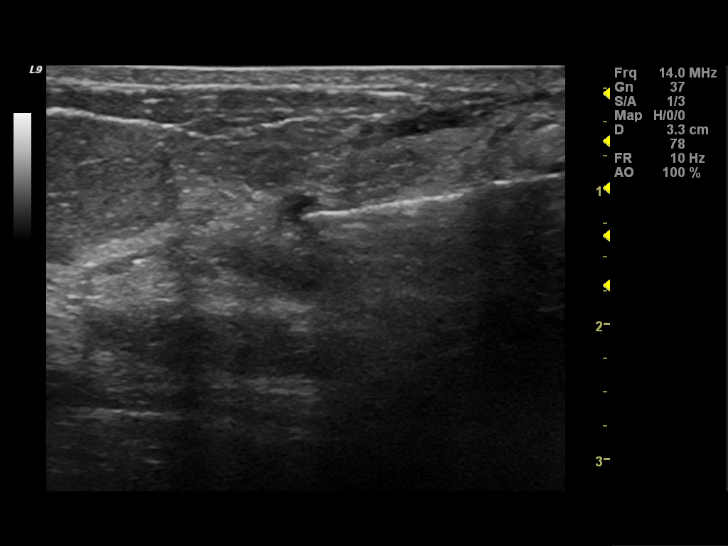

[13 of 13 positions shown; findings below may reference images not displayed]

I met with the patient and we discussed the procedure of ultrasound-
guided biopsy, including benefits and alternatives.  We discussed
the high likelihood of a successful procedure. We discussed the
risks of the procedure, including infection, bleeding, tissue
injury, clip migration, and inadequate sampling.  Informed written
consent was given.

Using sterile technique 2% lidocaine, ultrasound guidance and a 14
gauge automated biopsy device, biopsy was performed of the mass
located within the left breast at the 11 o'clock position.  At the
conclusion of the procedure a ribbon shaped tissue marker clip was
deployed into the biopsy cavity.  Follow up 2 view mammogram was
performed and dictated separately.
IMPRESSION: Ultrasound guided biopsy of the left breast mass located the 11
o'clock position as discussed above.  No apparent complications.

<!--  IDXRADR:ADDEND:INNER_END -->Addendum Ends
<!--  IDXRADR:ADDEND:END -->*RADIOLOGY REPORT*
I met with the patient and we discussed the procedure of ultrasound-
guided biopsy, including benefits and alternatives.  We discussed
the high likelihood of a successful procedure. We discussed the
risks of the procedure, including infection, bleeding, tissue
injury, clip migration, and inadequate sampling.  Informed written
consent was given.

Using sterile technique 2% lidocaine, ultrasound guidance and a 14
gauge automated biopsy device, biopsy was performed of the mass
located within the left breast at the 11 o'clock position.  At the
conclusion of the procedure a ribbon shaped tissue marker clip was
deployed into the biopsy cavity.  Follow up 2 view mammogram was
performed and dictated separately.
IMPRESSION: Ultrasound guided biopsy of the left breast mass located the 11
o'clock position as discussed above.  No apparent complications.

## 2011-05-16 IMAGING — MG MM DIAGNOSTIC UNILATERAL L
2 series · 2 of 2 positions shown · non-contrast
Comparison: Prior studies

CLINICAL DATA: Post left breast ultrasound guided core biopsy.

DIGITAL DIAGNOSTIC LEFT BREAST MAMMOGRAM

[L CC]
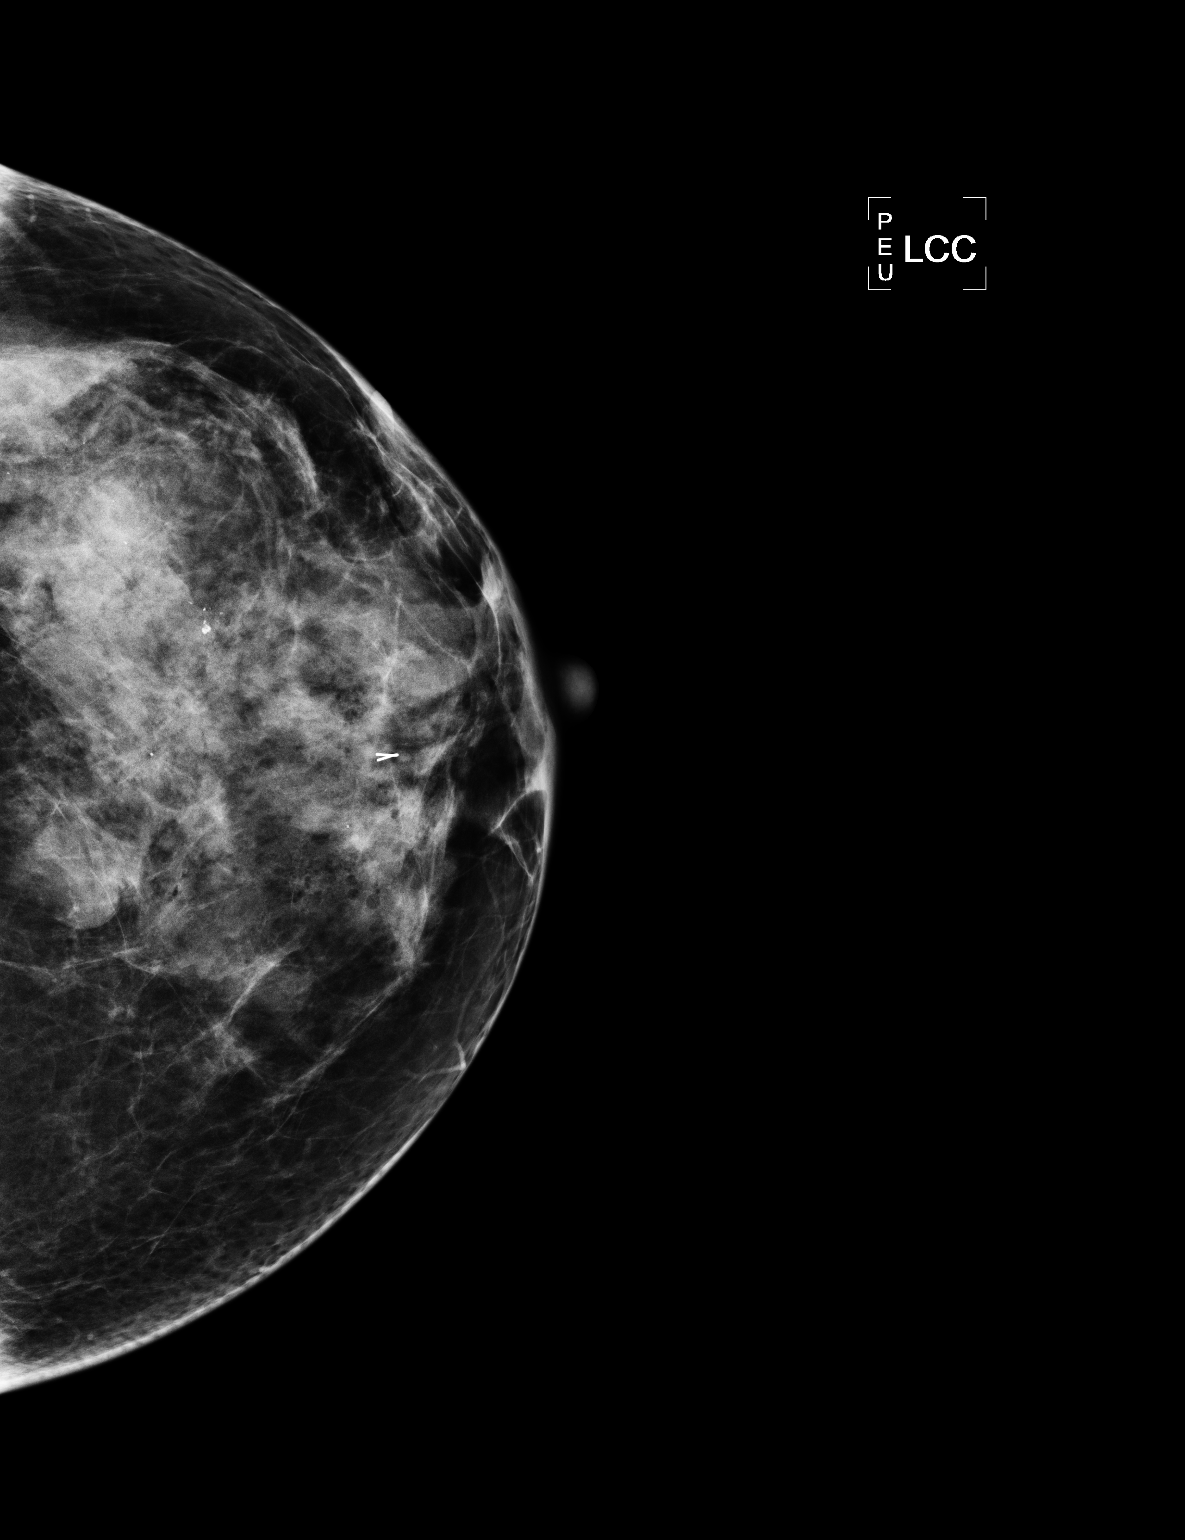

[L ML]
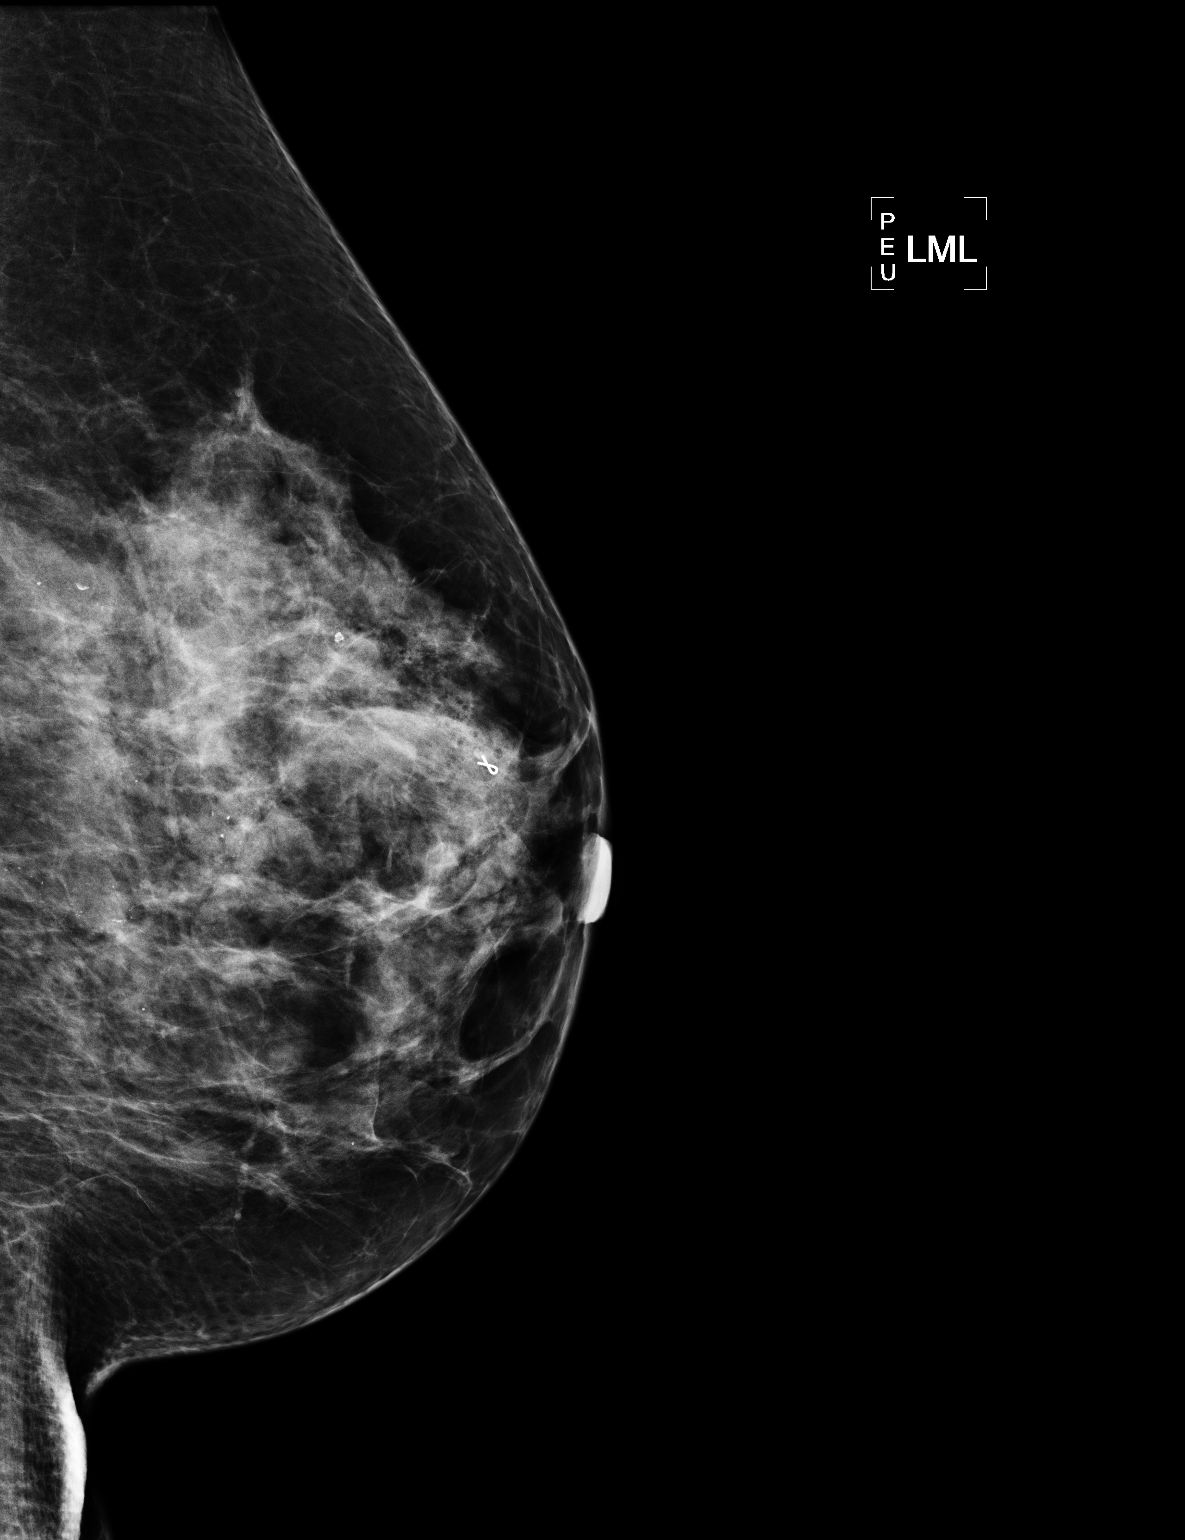

[2 of 2 positions shown; findings below may reference images not displayed]

FINDINGS: Films are performed following ultrasound guided biopsy
of the hypoechoic mass located within the left breast at the 11
o'clock position 2 cm from the nipple.  The ribbon shaped clip is
in appropriate position.
IMPRESSION: Appropriate positioning of clip following left breast ultrasound
guided core biopsy.

## 2011-12-21 ENCOUNTER — Ambulatory Visit (INDEPENDENT_AMBULATORY_CARE_PROVIDER_SITE_OTHER): Payer: BC Managed Care – PPO | Admitting: Sports Medicine

## 2011-12-21 ENCOUNTER — Encounter: Payer: Self-pay | Admitting: Sports Medicine

## 2011-12-21 VITALS — BP 119/83 | HR 95 | Temp 98.6°F | Ht 66.0 in | Wt 168.0 lb

## 2011-12-21 DIAGNOSIS — Z8639 Personal history of other endocrine, nutritional and metabolic disease: Secondary | ICD-10-CM

## 2011-12-21 DIAGNOSIS — IMO0001 Reserved for inherently not codable concepts without codable children: Secondary | ICD-10-CM

## 2011-12-21 DIAGNOSIS — R7401 Elevation of levels of liver transaminase levels: Secondary | ICD-10-CM

## 2011-12-21 DIAGNOSIS — J329 Chronic sinusitis, unspecified: Secondary | ICD-10-CM

## 2011-12-21 DIAGNOSIS — M791 Myalgia, unspecified site: Secondary | ICD-10-CM | POA: Insufficient documentation

## 2011-12-21 DIAGNOSIS — R7402 Elevation of levels of lactic acid dehydrogenase (LDH): Secondary | ICD-10-CM

## 2011-12-21 DIAGNOSIS — R21 Rash and other nonspecific skin eruption: Secondary | ICD-10-CM

## 2011-12-21 DIAGNOSIS — IMO0002 Reserved for concepts with insufficient information to code with codable children: Secondary | ICD-10-CM | POA: Insufficient documentation

## 2011-12-21 DIAGNOSIS — Z862 Personal history of diseases of the blood and blood-forming organs and certain disorders involving the immune mechanism: Secondary | ICD-10-CM

## 2011-12-21 MED ORDER — KETOROLAC TROMETHAMINE 30 MG/ML IJ SOLN
30.0000 mg | Freq: Once | INTRAMUSCULAR | Status: AC
  Administered 2011-12-21: 30 mg via INTRAMUSCULAR

## 2011-12-21 MED ORDER — AZITHROMYCIN 250 MG PO TABS: ORAL_TABLET | ORAL | Status: DC

## 2011-12-21 NOTE — Assessment & Plan Note (Signed)
This is unclear, it sounds as though a dermatologist diagnosis clinically of lupus, however it sounds as though no blood work has been done. We'll check a CBC, CMET, ANA.

## 2011-12-21 NOTE — Assessment & Plan Note (Signed)
Unclear etiology, patient is currently self treating fungal etiology. I think this is appropriate, however if still present in 2 weeks, need to biopsy for consideration of discoid lupus.

## 2011-12-21 NOTE — Assessment & Plan Note (Addendum)
Z-Pak. Over-the-counter decongestants. Follow up with me as needed for this.

## 2011-12-21 NOTE — Assessment & Plan Note (Addendum)
Toradol 30 mg intramuscular x1. Tylenol as needed. It is likely related to a mild reaction to the flu shot. This is not an allergy

## 2011-12-21 NOTE — Progress Notes (Signed)
Subjective:    CC: Establish care.   HPI:  Malaise/myalgia: Stephanie Chase had a flu shot yesterday, today she developed muscle aches, body aches, headaches, fevers, chills, and malaise. Unfortunately also she developed some sneezing, nasal drainage, and pressure over her maxillary sinuses.  Skin rash: Present behind her left knee, and is circular. She recalls a dermatologist telling her in the past that she may have lupus. She is currently using topical terbinafine cream. The rash is overall unchanged.  Hypothyroidism: Currently taking Synthroid, and well controlled per patient.  Past medical history, Surgical history, Family history, Social history, Allergies, and medications have been entered into the medical record, reviewed, and no changes needed.   Review of Systems: No headache, visual changes, nausea, vomiting, diarrhea, constipation, dizziness, abdominal pain, skin rash, fevers, chills, night sweats, weight loss, chest pain, body aches, joint swelling, muscle aches, or shortness of breath.   Objective:    General: Well Developed, well nourished, and in no acute distress.  Neuro: Alert and oriented x3, extra-ocular muscles intact.  HEENT: Normocephalic, atraumatic, pupils equal round reactive to light, neck supple, no masses, no lymphadenopathy, thyroid nonpalpable. Tender to palpation over maxillary sinuses. Ears canals clear. Skin: There is a circular rash present in 2 locations behind her left knee. It is somewhat scaly, but without central clearing, or leading border. These are approximately 1-2 cm in diameter. They're not tender to palpation. Cardiac: Regular rate and rhythm, no murmurs rubs or gallops.  Respiratory: Clear to auscultation bilaterally. Not using accessory muscles, speaking in full sentences.  Abdominal: Soft, nontender, nondistended, positive bowel sounds, no masses, no organomegaly.  Musculoskeletal: Shoulder, elbow, wrist, hip, knee, ankle stable, and with full range  of motion.   Impression and Recommendations:

## 2011-12-22 ENCOUNTER — Telehealth: Payer: Self-pay | Admitting: *Deleted

## 2011-12-22 ENCOUNTER — Encounter: Payer: Self-pay | Admitting: Sports Medicine

## 2011-12-22 LAB — COMPREHENSIVE METABOLIC PANEL
ALT: 52 U/L — ABNORMAL HIGH (ref 0–35)
AST: 66 U/L — ABNORMAL HIGH (ref 0–37)
Albumin: 4 g/dL (ref 3.5–5.2)
Alkaline Phosphatase: 88 U/L (ref 39–117)
BUN: 7 mg/dL (ref 6–23)
CO2: 26 mEq/L (ref 19–32)
Calcium: 10.5 mg/dL (ref 8.4–10.5)
Chloride: 102 mEq/L (ref 96–112)
Creat: 0.69 mg/dL (ref 0.50–1.10)
Glucose, Bld: 86 mg/dL (ref 70–99)
Potassium: 4.1 mEq/L (ref 3.5–5.3)
Sodium: 137 mEq/L (ref 135–145)
Total Bilirubin: 0.5 mg/dL (ref 0.3–1.2)
Total Protein: 6.8 g/dL (ref 6.0–8.3)

## 2011-12-22 LAB — CBC
HCT: 43.6 % (ref 36.0–46.0)
Hemoglobin: 15 g/dL (ref 12.0–15.0)
MCH: 29.8 pg (ref 26.0–34.0)
MCHC: 34.4 g/dL (ref 30.0–36.0)
MCV: 86.5 fL (ref 78.0–100.0)
Platelets: 216 10*3/uL (ref 150–400)
RBC: 5.04 MIL/uL (ref 3.87–5.11)
RDW: 13.4 % (ref 11.5–15.5)
WBC: 5.8 10*3/uL (ref 4.0–10.5)

## 2011-12-22 LAB — ANTI-NUCLEAR AB-TITER (ANA TITER): ANA Titer 1: NEGATIVE

## 2011-12-22 LAB — ANA: Anti Nuclear Antibody(ANA): POSITIVE — AB

## 2011-12-22 NOTE — Addendum Note (Signed)
Addended by: Monica Becton on: 12/22/2011 08:35 AM   Modules accepted: Orders

## 2011-12-22 NOTE — Telephone Encounter (Signed)
Message copied by Renea Ee on Fri Dec 22, 2011  9:50 AM ------      Message from: Monica Becton      Created: Fri Dec 22, 2011  8:36 AM       Added some extra bloodwork, she had elevated LFTs.  Pls see if these can be added on, if not will have to draw again.      Thanks!

## 2011-12-22 NOTE — Telephone Encounter (Signed)
Lab has been added. 

## 2011-12-25 LAB — HEPATITIS PANEL, ACUTE
HCV Ab: NEGATIVE
Hep A IgM: NEGATIVE
Hep B C IgM: NEGATIVE
Hepatitis B Surface Ag: NEGATIVE

## 2011-12-26 ENCOUNTER — Telehealth: Payer: Self-pay | Admitting: *Deleted

## 2011-12-26 DIAGNOSIS — R7401 Elevation of levels of liver transaminase levels: Secondary | ICD-10-CM

## 2011-12-26 DIAGNOSIS — R7402 Elevation of levels of lactic acid dehydrogenase (LDH): Secondary | ICD-10-CM

## 2011-12-26 NOTE — Telephone Encounter (Signed)
Pt informed and will have labs redrawn.

## 2011-12-26 NOTE — Telephone Encounter (Signed)
Yes, the initial ANA was positive, but the confirmatory titer results were negative. This represents a negative test. 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 She does not need a referral to a rheumatologist at this time. Because her liver tests are elevated, I would like to recheck them to ensure that they are either stable, or improving.

## 2011-12-26 NOTE — Telephone Encounter (Signed)
Pt called wanting lab results. Seen the ANA was positive is there any information you want me to tell pt in regards to this results

## 2011-12-26 NOTE — Addendum Note (Signed)
Addended by: Monica Becton on: 12/26/2011 08:54 AM   Modules accepted: Orders

## 2011-12-26 NOTE — Telephone Encounter (Signed)
Pt informed

## 2012-01-23 ENCOUNTER — Telehealth: Payer: Self-pay | Admitting: *Deleted

## 2012-01-23 DIAGNOSIS — R21 Rash and other nonspecific skin eruption: Secondary | ICD-10-CM

## 2012-01-23 NOTE — Telephone Encounter (Signed)
Pt states she will see derm then see you after

## 2012-01-23 NOTE — Telephone Encounter (Signed)
Pt calls and states she has appt with [facility] on Thursday and didn't realize she needed a referral to see them. Said appt was made prior to her seeing you, seeing them for ? Lupus. Can you put referral in

## 2012-01-23 NOTE — Telephone Encounter (Signed)
Happy to put it in but I was going to biopsy the lesion first (for ? Discoid lupus) before referral to confirm the diagnosis.  This was going to be done at her f/u visit if no better after the antifungals.  I would still like to see her for this unless she wants derm to do it.

## 2012-01-26 ENCOUNTER — Telehealth: Payer: Self-pay | Admitting: *Deleted

## 2012-01-26 ENCOUNTER — Encounter: Payer: Self-pay | Admitting: Sports Medicine

## 2012-01-26 DIAGNOSIS — E039 Hypothyroidism, unspecified: Secondary | ICD-10-CM | POA: Insufficient documentation

## 2012-02-01 LAB — TSH: TSH: 1.064 u[IU]/mL (ref 0.350–4.500)

## 2012-02-02 ENCOUNTER — Encounter: Payer: Self-pay | Admitting: Sports Medicine

## 2012-02-13 ENCOUNTER — Ambulatory Visit (INDEPENDENT_AMBULATORY_CARE_PROVIDER_SITE_OTHER): Payer: BC Managed Care – PPO | Admitting: Sports Medicine

## 2012-02-13 ENCOUNTER — Encounter: Payer: Self-pay | Admitting: Sports Medicine

## 2012-02-13 VITALS — BP 124/80 | HR 80 | Resp 18 | Wt 175.0 lb

## 2012-02-13 DIAGNOSIS — H9190 Unspecified hearing loss, unspecified ear: Secondary | ICD-10-CM

## 2012-02-13 DIAGNOSIS — Z862 Personal history of diseases of the blood and blood-forming organs and certain disorders involving the immune mechanism: Secondary | ICD-10-CM

## 2012-02-13 DIAGNOSIS — IMO0002 Reserved for concepts with insufficient information to code with codable children: Secondary | ICD-10-CM

## 2012-02-13 DIAGNOSIS — Z8639 Personal history of other endocrine, nutritional and metabolic disease: Secondary | ICD-10-CM

## 2012-02-13 DIAGNOSIS — E039 Hypothyroidism, unspecified: Secondary | ICD-10-CM

## 2012-02-13 HISTORY — DX: Unspecified hearing loss, unspecified ear: H91.90

## 2012-02-13 MED ORDER — LIOTHYRONINE SODIUM 5 MCG PO TABS: 10.0000 ug | ORAL_TABLET | Freq: Every day | ORAL | Status: DC

## 2012-02-13 NOTE — Progress Notes (Signed)
Subjective:     Patient ID: Stephanie Chase, female   DOB: [birth year], 51 y.o.   MRN: [id number]  HPI Patient is a 40 woman with a past medical history of hypothyroid, vitiligo and lupus who presents with weight gain, fatigue, dry skin, brittle hair, flank pain and questions about lupus/vitiligo management.   Patient believes she is "low thyroid". She has gained around 40 pounds in the last 2-2.5 years. Feels like she is always cold and tired. She is currently being treated with levothyroxine and liothyronine but thinks it might not be working.   Patient is confused as to where she needs to go for treatment of her lupus and vitiligo. She has seen a dermatologist who told her that he did not have the capability to treat the diseases. She would like a recommendation on where she can go for treatment.   Patient also feels like her hearing is worsening. She has noticed that she can not understand people if they are not looking directly at her while they are talking to her.   Patient is unsure of her metapausal status. She has a fallopian tube ligation between 5 and 9 years ago.    Past medical history, Surgical history, Family history, Social history, Allergies, and medications have been entered into the medical record, reviewed, and no changes needed.    Review of Systems  Constitutional: Positive for fatigue and unexpected weight change. Negative for activity change and appetite change.  Genitourinary: Positive for flank pain.  Skin: Positive for color change.  No fevers, chills, night sweats, weight loss, chest pain, or shortness of breath.      Objective:   Physical Exam  Constitutional: She appears well-developed and well-nourished.  HENT:  Head: Normocephalic and atraumatic.  Neck: Normal range of motion. Neck supple. No thyromegaly present.  Cardiovascular: Normal rate, regular rhythm and normal heart sounds.   Pulmonary/Chest: Effort normal and breath sounds normal.         Assessment/plan:

## 2012-02-13 NOTE — Assessment & Plan Note (Addendum)
Audiologist referral per patient request.

## 2012-02-13 NOTE — Assessment & Plan Note (Addendum)
With discoid rash, dermatologist has seen her and asked her to go to [facility]. Referral to [facility] rheumatology.

## 2012-02-13 NOTE — Assessment & Plan Note (Addendum)
She still has feeling cold sensation. Clinically she is still hypothyroid however based on labs she is euthyroid. Increasing liothyronine to 10 mg daily. No change in Synthroid. Return to clinic in 6 weeks to recheck TSH to ensure not hyperthyroid.

## 2012-02-23 DIAGNOSIS — M722 Plantar fascial fibromatosis: Secondary | ICD-10-CM | POA: Insufficient documentation

## 2012-02-23 DIAGNOSIS — M159 Polyosteoarthritis, unspecified: Secondary | ICD-10-CM | POA: Insufficient documentation

## 2012-02-23 DIAGNOSIS — L80 Vitiligo: Secondary | ICD-10-CM | POA: Insufficient documentation

## 2012-02-23 DIAGNOSIS — L930 Discoid lupus erythematosus: Secondary | ICD-10-CM | POA: Insufficient documentation

## 2012-02-23 HISTORY — DX: Plantar fascial fibromatosis: M72.2

## 2012-02-23 HISTORY — DX: Polyosteoarthritis, unspecified: M15.9

## 2012-02-23 HISTORY — DX: Vitiligo: L80

## 2012-03-21 ENCOUNTER — Ambulatory Visit (INDEPENDENT_AMBULATORY_CARE_PROVIDER_SITE_OTHER): Payer: BC Managed Care – PPO | Admitting: Sports Medicine

## 2012-03-21 ENCOUNTER — Encounter: Payer: Self-pay | Admitting: Sports Medicine

## 2012-03-21 VITALS — BP 123/80 | HR 81 | Wt 178.0 lb

## 2012-03-21 DIAGNOSIS — J329 Chronic sinusitis, unspecified: Secondary | ICD-10-CM

## 2012-03-21 MED ORDER — FLUTICASONE PROPIONATE 50 MCG/ACT NA SUSP: NASAL | Status: DC

## 2012-03-21 MED ORDER — AMOXICILLIN-POT CLAVULANATE 875-125 MG PO TABS: 1.0000 | ORAL_TABLET | Freq: Two times a day (BID) | ORAL | Status: DC

## 2012-03-21 MED ORDER — HYDROCOD POLST-CHLORPHEN POLST 10-8 MG/5ML PO LQCR: 5.0000 mL | Freq: Two times a day (BID) | ORAL | Status: DC | PRN

## 2012-03-21 NOTE — Progress Notes (Signed)
Subjective:    CC: Sick  HPI: Stephanie Chase comes in with a three-week history of pain and pressure that she localizes over both maxillary sinuses, radiating to her ears and upper teeth. [facility], and was given azithromycin. This did not help her symptoms. She also has a cough that is nonproductive. Denies any constitutional symptoms. No body aches, or muscle aches. Pain is localized, doesn't radiate, is moderate. No GI symptoms.  Past medical history, Surgical history, Family history, Social history, Allergies, and medications have been entered into the medical record, reviewed, and no changes needed.   Review of Systems: No fevers, chills, night sweats, weight loss, chest pain, or shortness of breath.   Objective:    General: Well Developed, well nourished, and in no acute distress.  Neuro: Alert and oriented x3, extra-ocular muscles intact.  HEENT: Normocephalic, atraumatic, pupils equal round reactive to light, neck supple, no masses, no lymphadenopathy, thyroid nonpalpable. Oropharynx, nasopharynx, external auditory meatus appears unremarkable bilaterally. There is mild tenderness to palpation over maxillary sinuses. Skin: Warm and dry, no rashes. Cardiac: Regular rate and rhythm, no murmurs rubs or gallops.  Respiratory: Clear to auscultation bilaterally. Not using accessory muscles, speaking in full sentences.   Impression and Recommendations:

## 2012-03-21 NOTE — Assessment & Plan Note (Signed)
Failed a single course of azithromycin without intranasal steroids. We will add Augmentin, with Flonase. Do this for 10 days, if no better consider CT scan of sinuses for confirmation.

## 2012-03-28 ENCOUNTER — Other Ambulatory Visit: Payer: Self-pay | Admitting: Sports Medicine

## 2012-03-28 DIAGNOSIS — Z1231 Encounter for screening mammogram for malignant neoplasm of breast: Secondary | ICD-10-CM

## 2012-04-05 ENCOUNTER — Ambulatory Visit (INDEPENDENT_AMBULATORY_CARE_PROVIDER_SITE_OTHER): Payer: BC Managed Care – PPO | Admitting: Obstetrics and Gynecology

## 2012-04-05 ENCOUNTER — Ambulatory Visit
Admission: RE | Admit: 2012-04-05 | Discharge: 2012-04-05 | Disposition: A | Discharge status: Home / Self Care | Payer: BC Managed Care – PPO | Source: Ambulatory Visit | Attending: Sports Medicine | Admitting: Sports Medicine

## 2012-04-05 ENCOUNTER — Encounter: Payer: Self-pay | Admitting: Obstetrics and Gynecology

## 2012-04-05 VITALS — BP 120/80 | Ht 66.0 in | Wt 180.0 lb

## 2012-04-05 DIAGNOSIS — Z01419 Encounter for gynecological examination (general) (routine) without abnormal findings: Secondary | ICD-10-CM

## 2012-04-05 DIAGNOSIS — Z124 Encounter for screening for malignant neoplasm of cervix: Secondary | ICD-10-CM

## 2012-04-05 DIAGNOSIS — Z1231 Encounter for screening mammogram for malignant neoplasm of breast: Secondary | ICD-10-CM

## 2012-04-05 IMAGING — MG MM DIGITAL SCREENING BILAT
8 series · 9 of 24 positions shown · non-contrast
Comparison: [DATE], [DATE], [DATE]

CLINICAL DATA: Screening.

DIGITAL BILATERAL SCREENING MAMMOGRAM WITH CAD
DIGITAL BREAST TOMOSYNTHESIS
Digital breast tomosynthesis images are acquired in two
projections.  These images are reviewed in combination with the
digital mammogram, confirming the findings below.

[L MLO]
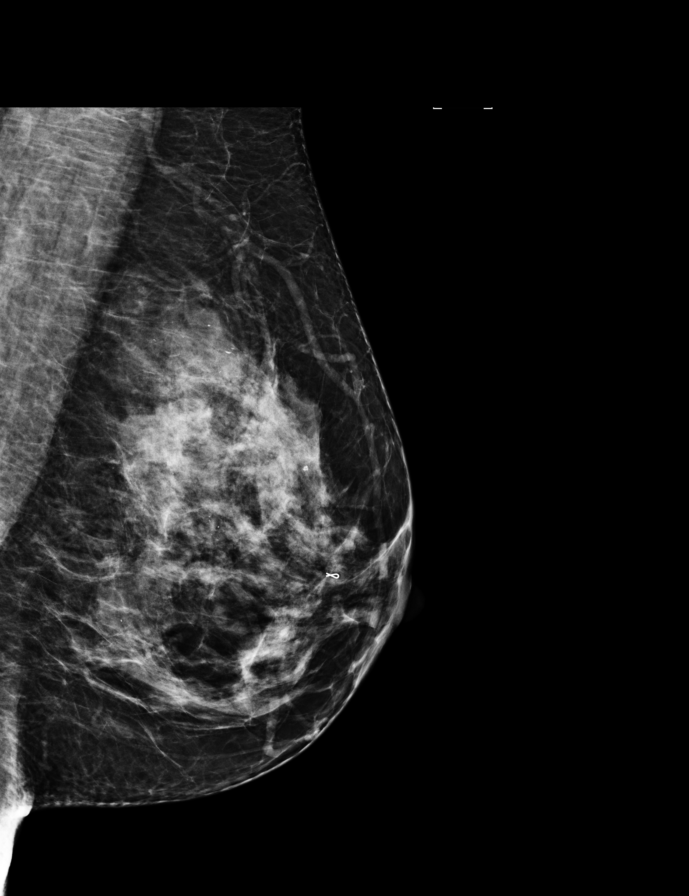

[R MLO]
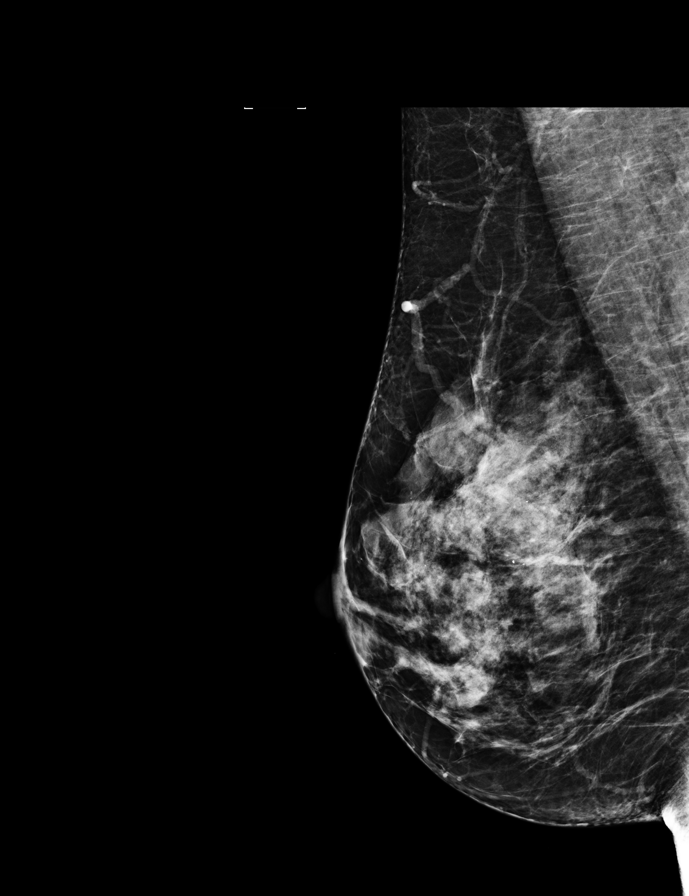

[R CC]
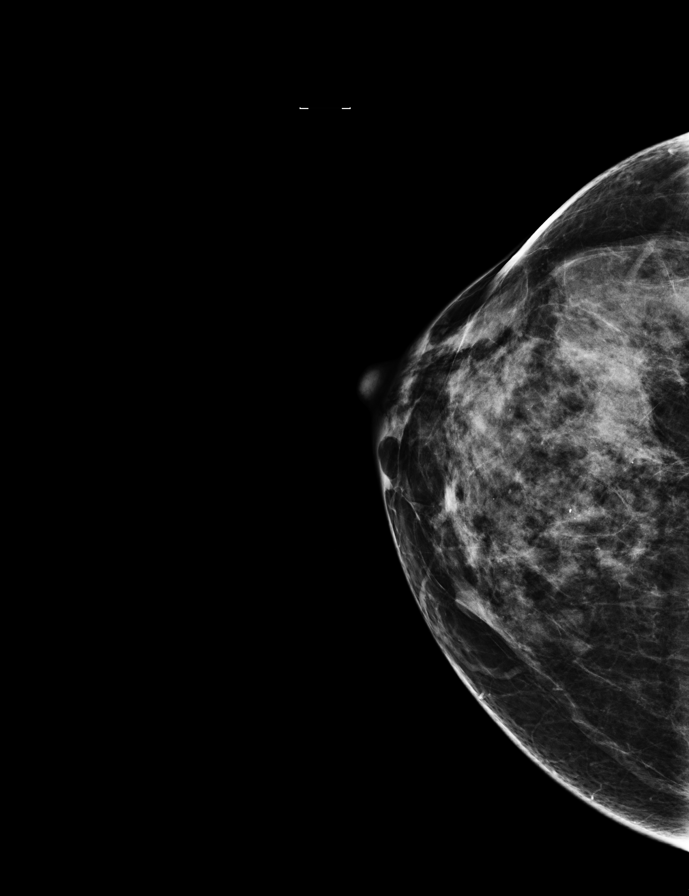

[L CC]
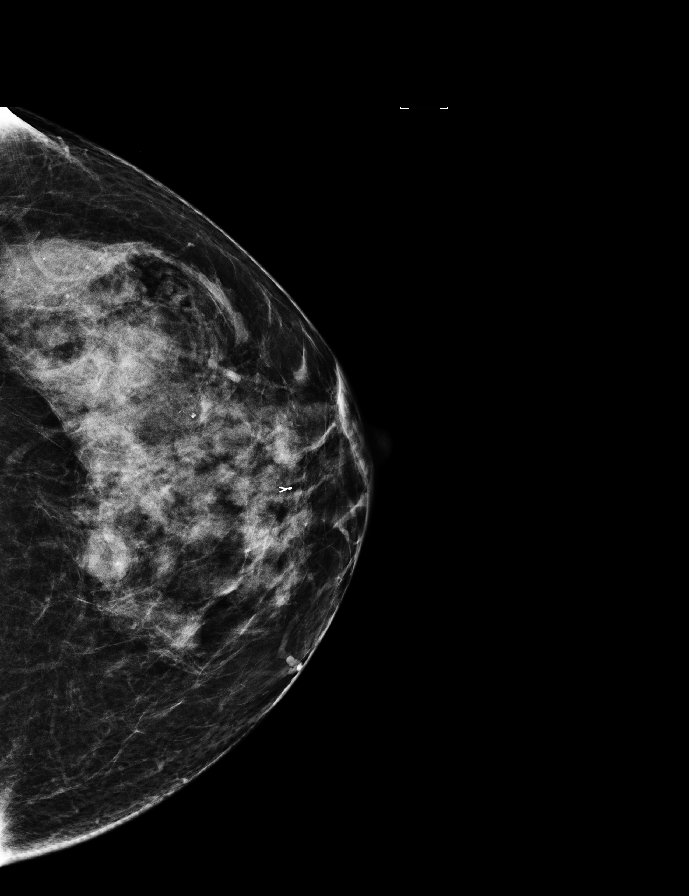

[R CC tomo · 2 of 63 frames shown]
[frame 21/63]
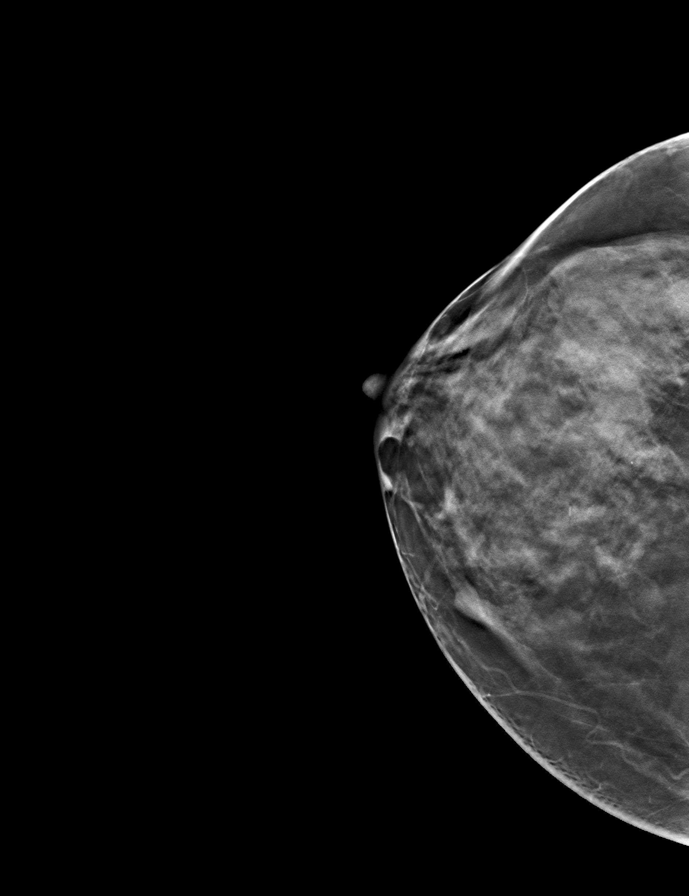
[frame 32/63]
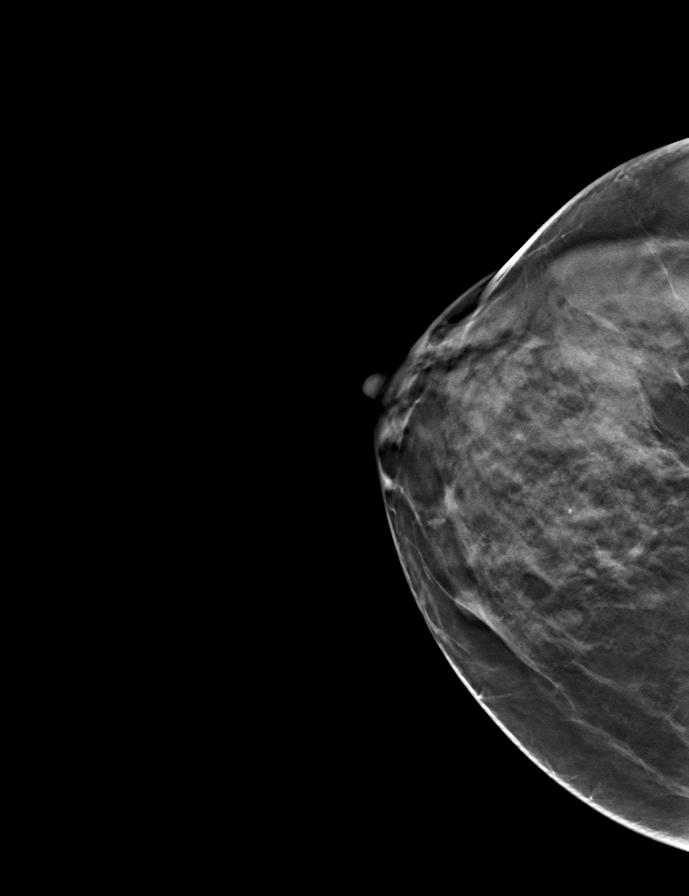

[L MLO tomo · tomo slice 39/77.0]
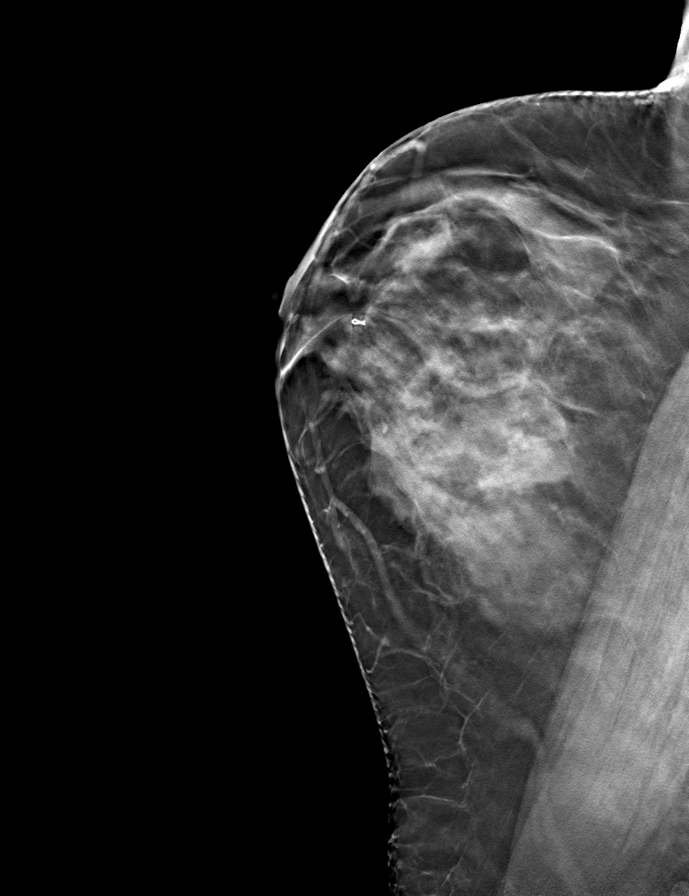

[L CC tomo · tomo slice 37/73.0]
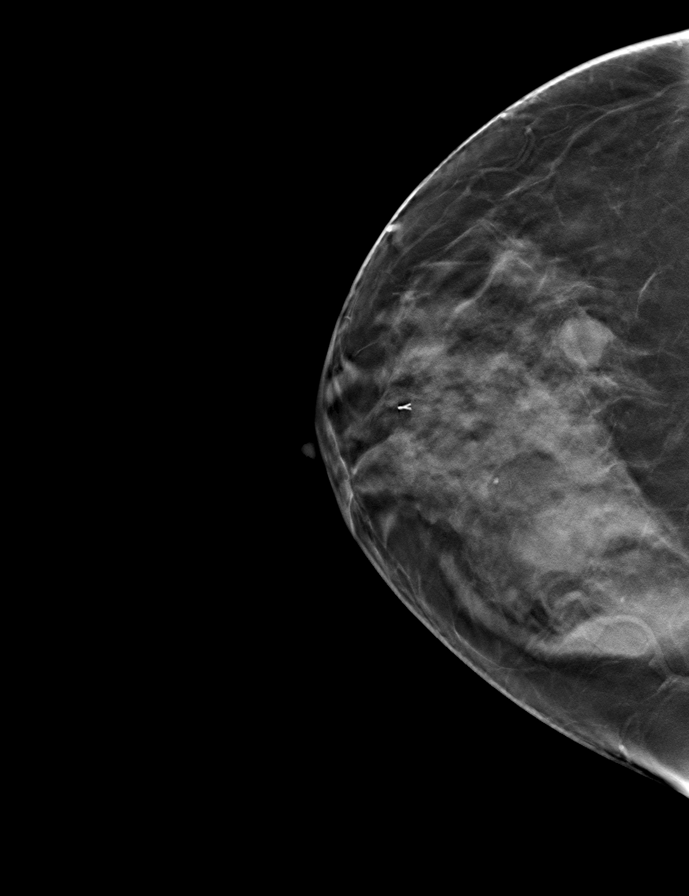

[R MLO tomo · tomo slice 36/71.0]
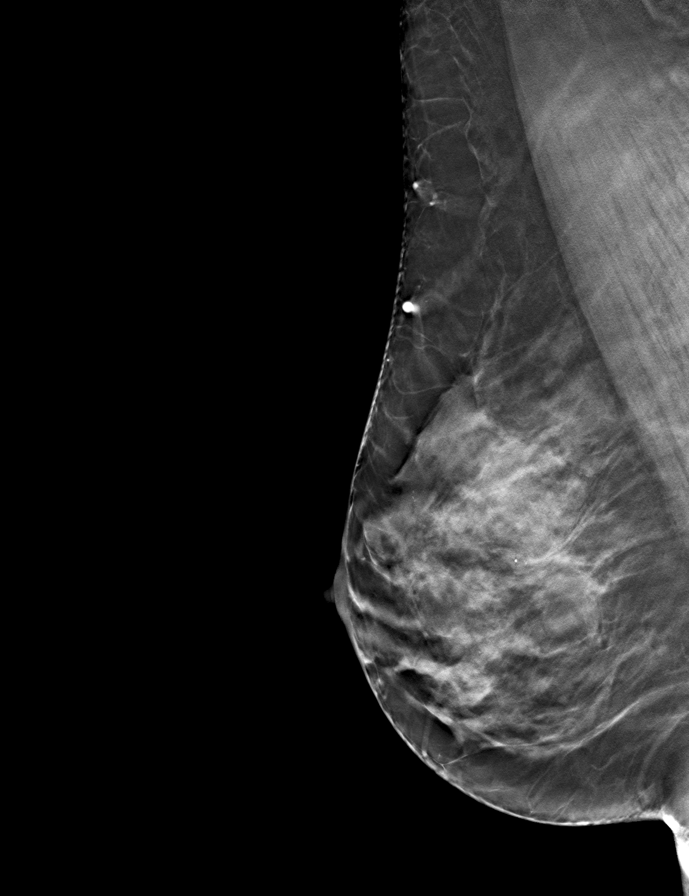

[9 of 24 positions shown; findings below may reference images not displayed]

FINDINGS: ACR Breast Density Category 3: The breast tissue is heterogeneously
dense.

There is no suspicious dominant mass, architectural distortion, or
calcification to suggest malignancy.  Fluctuating masses consistent
with cysts are again noted bilaterally.

Images were processed with CAD.
IMPRESSION: No mammographic evidence of malignancy.

A result letter of this screening mammogram will be mailed directly
to the patient.

RECOMMENDATION:
Screening mammogram in one year. (Code:[C8])

BI-RADS CATEGORY 1:  Negative.

## 2012-04-05 NOTE — Progress Notes (Signed)
The patient reports no complaints   Contraception:BTL  Last mammogram: 04/05/2012 Last pap:03/31/2011 Normal  Colonoscopy: 2013  GC/Chlamydia cultures offered: declined HIV/RPR/HbsAg offered:  declined HSV 1 and 2 glycoprotein offered: declined  Menstrual cycle regular and monthly: No  Ablation  Menstrual flow normal: No  Urinary symptoms: none Normal bowel movements: Yes Reports abuse at home: No:   Subjective:    Stephanie Chase is a 52 y.o. female, G2P2, who presents for an annual exam.     History   Social History  . Marital Status: Divorced    Spouse Name: N/A    Number of Children: N/A  . Years of Education: N/A   Social History Main Topics  . Smoking status: Current Every Day Smoker -- 1.0 packs/day    Types: Cigarettes  . Smokeless tobacco: None  . Alcohol Use: Yes     Comment: once a month  . Drug Use: No  . Sexually Active: Yes -- Female partner(s)   Other Topics Concern  . None   Social History Narrative  . None    Menstrual cycle:   LMP: No LMP recorded.           Cycle: amenorrhea with ablation  The following portions of the patient's history were reviewed and updated as appropriate: allergies, current medications, past family history, past medical history, past social history, past surgical history and problem list.  Review of Systems Pertinent items are noted in HPI. Breast:Negative for breast lump,nipple discharge or nipple retraction Gastrointestinal: Negative for abdominal pain, change in bowel habits or rectal bleeding Urinary:negative   Objective:    There were no vitals taken for this visit.    Weight:  Wt Readings from Last 1 Encounters:  03/21/12 178 lb (80.74 kg)          BMI: There is no height or weight on file to calculate BMI.  General Appearance: Alert, appropriate appearance for age. No acute distress HEENT: Grossly normal Neck / Thyroid: Supple, no masses, nodes or enlargement Lungs: clear to auscultation bilaterally Back:  No CVA tenderness Breast Exam: No dimpling, nipple retraction or discharge. No masses or nodes. and No masses or nodes.No dimpling, nipple retraction or discharge. Cardiovascular: Regular rate and rhythm. S1, S2, no murmur Gastrointestinal: Soft, non-tender, no masses or organomegaly Pelvic Exam: Vulva and vagina appear normal. Bimanual exam reveals normal uterus and adnexa. Rectovaginal: normal rectal, no masses Lymphatic Exam: Non-palpable nodes in neck, clavicular, axillary, or inguinal regions Skin: no rash or abnormalities Neurologic: Normal gait and speech, no tremor  Psychiatric: Alert and oriented, appropriate affect.   Assessment:    Normal gyn exam    Plan:    mammogram pap smear return annually or prn STD screening: declined Contraception:bilateral tubal ligation   Silverio Lay MD

## 2012-04-09 LAB — PAP IG W/ RFLX HPV ASCU

## 2012-05-02 ENCOUNTER — Encounter: Payer: Self-pay | Admitting: Sports Medicine

## 2012-05-02 MED ORDER — LEVOTHYROXINE SODIUM 100 MCG PO TABS: 100.0000 ug | ORAL_TABLET | Freq: Every day | ORAL | Status: DC

## 2012-05-02 NOTE — Telephone Encounter (Signed)
Done

## 2012-05-21 ENCOUNTER — Telehealth: Payer: Self-pay | Admitting: *Deleted

## 2012-05-21 DIAGNOSIS — E039 Hypothyroidism, unspecified: Secondary | ICD-10-CM

## 2012-05-22 LAB — TSH: TSH: 0.063 u[IU]/mL — ABNORMAL LOW (ref 0.350–4.500)

## 2012-05-24 ENCOUNTER — Encounter: Payer: Self-pay | Admitting: Sports Medicine

## 2012-05-29 ENCOUNTER — Other Ambulatory Visit: Payer: Self-pay | Admitting: Sports Medicine

## 2012-05-29 MED ORDER — DIAZEPAM 5 MG PO TABS: ORAL_TABLET | ORAL | Status: DC

## 2012-06-04 ENCOUNTER — Encounter: Payer: Self-pay | Admitting: *Deleted

## 2012-06-04 ENCOUNTER — Emergency Department
Admission: EM | Admit: 2012-06-04 | Discharge: 2012-06-04 | Disposition: A | Discharge status: Home / Self Care | Payer: BC Managed Care – PPO | Source: Home / Self Care | Attending: Family Medicine | Admitting: Family Medicine

## 2012-06-04 DIAGNOSIS — M26629 Arthralgia of temporomandibular joint, unspecified side: Secondary | ICD-10-CM

## 2012-06-04 NOTE — ED Provider Notes (Signed)
History     CSN: [id number]  Arrival date & time 06/04/12  1655   First MD Initiated Contact with Patient 06/04/12 1750      Chief Complaint  Patient presents with  . Otalgia      HPI Comments: Patient complains of right ear pain for about 2 days.  She had had a mild sore throat a week ago, now resolved.  No nasal congestion.  No fevers, chills, and sweats.  She feels well otherwise.  The history is provided by the patient.    Past Medical History  Diagnosis Date  . Lupus   . Thyroid disease     Past Surgical History  Procedure Laterality Date  . Breast lumpectomy    . Wisdom tooth extraction    . Tonsillectomy      Family History  Problem Relation Age of Onset  . Breast cancer Mother   . Diabetes Mother   . Heart attack Father   . Hyperlipidemia Father   . Hypertension Father   . Alzheimer's disease Father   . Alzheimer's disease      History  Substance Use Topics  . Smoking status: Current Every Day Smoker -- 1.00 packs/day    Types: Cigarettes  . Smokeless tobacco: Never Used  . Alcohol Use: Yes     Comment: once a month    OB History   Grav Para Term Preterm Abortions TAB SAB Ect Mult Living   2 2              Review of Systems No sore throat at present No cough No pleuritic pain No wheezing No nasal congestion No post-nasal drainage No sinus pain/pressure No itchy/red eyes + right earache No hemoptysis No SOB No fever/chills No nausea No vomiting No abdominal pain No diarrhea No urinary symptoms No skin rashes No fatigue No myalgias No headache Used OTC meds without relief  Allergies  Review of patient's allergies indicates no known allergies.  Home Medications   Current Outpatient Rx  Name  Route  Sig  Dispense  Refill  . amoxicillin-clavulanate (AUGMENTIN) 875-125 MG per tablet   Oral   Take 1 tablet by mouth 2 (two) times daily.   20 tablet   0   . chlorpheniramine-HYDROcodone (TUSSIONEX) 10-8 MG/5ML LQCR   Oral  Take 5 mLs by mouth every 12 (twelve) hours as needed (cough, will cause drowsiness.).   120 mL   0   . diazepam (VALIUM) 5 MG tablet      Take 1 tab PO 2 hours before procedure or imaging.   2 tablet   0   . fluticasone (FLONASE) 50 MCG/ACT nasal spray      One spray in each nostril twice a day, use left hand for right nostril, and right hand for left nostril.   48 g   3   . levothyroxine (SYNTHROID, LEVOTHROID) 100 MCG tablet   Oral   Take 1 tablet (100 mcg total) by mouth daily.   30 tablet   0   . liothyronine (CYTOMEL) 5 MCG tablet   Oral   Take 1 tablet (5 mcg total) by mouth daily.   60 tablet   3     BP 129/83  Pulse 79  Temp(Src) 98 F (36.7 C) (Oral)  Ht 5\' 5"  (1.651 m)  Wt 164 lb (74.39 kg)  BMI 27.29 kg/m2  SpO2 97%  Physical Exam Nursing notes and Vital Signs reviewed. Appearance:  Patient appears healthy, stated age,  and in no acute distress Eyes:  Pupils are equal, round, and reactive to light and accomodation.  Extraocular movement is intact.  Conjunctivae are not inflamed  Ears:  Canals normal.  Tympanic membranes normal.  There is distinct tenderness over the right  temporomandibular joint.  Palpation there recreates her pain.   Nose:  Mildly congested turbinates.  No sinus tenderness.   Pharynx:  Normal Neck:  Supple.  No adenopathy Lungs:  Clear to auscultation.  Breath sounds are equal.  Heart:  Regular rate and rhythm without murmurs, rubs, or gallops.  Skin:  No rash present.   ED Course  Procedures  none  Labs Reviewed - Tympanogram:  High peak height bilateral    1. TMJ arthralgia; no evidence otitis media       MDM   Apply ice pack to right TMJ two or three times daily.  Begin Ibuprofen 200mg , 4 tabs every 8 hours with food.  Followup with ENT if not improving two weeks.        Lattie Haw, MD 06/10/12 [phone]

## 2012-06-04 NOTE — ED Notes (Signed)
Pt c/o RT ear pain x 1 wk.  Denies fever. No OTC meds.

## 2012-06-05 ENCOUNTER — Other Ambulatory Visit: Payer: Self-pay | Admitting: Sports Medicine

## 2012-06-07 ENCOUNTER — Ambulatory Visit (INDEPENDENT_AMBULATORY_CARE_PROVIDER_SITE_OTHER): Payer: BC Managed Care – PPO | Admitting: Sports Medicine

## 2012-06-07 ENCOUNTER — Encounter: Payer: Self-pay | Admitting: Sports Medicine

## 2012-06-07 VITALS — BP 114/78 | HR 77 | Wt 166.0 lb

## 2012-06-07 DIAGNOSIS — B353 Tinea pedis: Secondary | ICD-10-CM

## 2012-06-07 DIAGNOSIS — B070 Plantar wart: Secondary | ICD-10-CM

## 2012-06-07 MED ORDER — CLOTRIMAZOLE-BETAMETHASONE 1-0.05 % EX CREA: TOPICAL_CREAM | Freq: Two times a day (BID) | CUTANEOUS | Status: DC

## 2012-06-07 NOTE — Progress Notes (Signed)
Subjective:    CC: Plantar wart  HPI: Stephanie Chase is unfortunately been dealing with a plantar wart for years. She has been shaving the one under her metatarsal however she developed another one under her fifth metatarsal head. We discussed cryotherapy versus surgical excision in the past. They're painful, pain is localized, doesn't radiate. Mild.  Past medical history, Surgical history, Family history not pertinant except as noted below, Social history, Allergies, and medications have been entered into the medical record, reviewed, and no changes needed.   Review of Systems: No fevers, chills, night sweats, weight loss, chest pain, or shortness of breath.   Objective:    General: Well Developed, well nourished, and in no acute distress.  Neuro: Alert and oriented x3, extra-ocular muscles intact, sensation grossly intact.  HEENT: Normocephalic, atraumatic, pupils equal round reactive to light, neck supple, no masses, no lymphadenopathy, thyroid nonpalpable.  Skin: Warm and dry, no rashes.Plantar warts both under the right third metatarsal head as well as right fifth metatarsal head.  Cardiac: Regular rate and rhythm, no murmurs rubs or gallops.  Respiratory: Clear to auscultation bilaterally. Not using accessory muscles, speaking in full sentences.  Procedure: Cryotherapy of plantar warts. 2 plantar warts as above were destroyed with liquid nitrogen. Impression and Recommendations:

## 2012-06-07 NOTE — Assessment & Plan Note (Signed)
Lotrisone twice a day. 

## 2012-06-07 NOTE — Assessment & Plan Note (Signed)
Cryotherapy on 2 plantar warts on the foot. I would like her to come back for custom orthotics and likely metatarsal pads to unload the wart under the third metatarsal head. At that point I can also likely re cryotherapy her foot.

## 2012-06-10 ENCOUNTER — Ambulatory Visit (INDEPENDENT_AMBULATORY_CARE_PROVIDER_SITE_OTHER): Payer: BC Managed Care – PPO | Admitting: Sports Medicine

## 2012-06-10 DIAGNOSIS — B070 Plantar wart: Secondary | ICD-10-CM

## 2012-06-10 NOTE — Assessment & Plan Note (Signed)
Cryotherapy performed x2. Custom orthotics made with a dancer's pad to unload both the third and fifth metatarsal plantar warts. Return in a couple weeks, I can excise the warts if still present.

## 2012-06-10 NOTE — Progress Notes (Signed)
  Subjective:    CC: Followup  HPI: Plantar warts: Status post cryotherapy x1 on a plantar wart under the right third and fifth metatarsal heads. She's here today for custom orthotics and repeat cryotherapy.  Past medical history, Surgical history, Family history not pertinant except as noted below, Social history, Allergies, and medications have been entered into the medical record, reviewed, and no changes needed.   Review of Systems: No headache, visual changes, nausea, vomiting, diarrhea, constipation, dizziness, abdominal pain, skin rash, fevers, chills, night sweats, weight loss, swollen lymph nodes, body aches, joint swelling, muscle aches, chest pain, shortness of breath, mood changes, visual or auditory hallucinations.   Objective:   General: Well Developed, well nourished, and in no acute distress.  Neuro/Psych: Alert and oriented x3, extra-ocular muscles intact, able to move all 4 extremities, sensation grossly intact. Skin: Warm and dry, no rashes noted.  Respiratory: Not using accessory muscles, speaking in full sentences, trachea midline.  Cardiovascular: Pulses palpable, no extremity edema. Abdomen: Does not appear distended.  Procedure: Cryotherapy of plantar warts. 2 plantar warts as above were destroyed with liquid nitrogen.  Patient was fitted for a : standard, cushioned, semi-rigid orthotic. The orthotic was heated and afterward the patient stood on the orthotic blank positioned on the orthotic stand. The patient was positioned in subtalar neutral position and 10 degrees of ankle dorsiflexion in a weight bearing stance. After completion of molding, a stable base was applied to the orthotic blank. The blank was ground to a stable position for weight bearing. Size: 9 Base: Blue EVA Additional Posting and Padding: Dancers pad placed just behind third metatarsal plantar wart with cutout under fifth metatarsal plantar wart The patient ambulated these, and they were very  comfortable.  I spent 40 minutes with this patient, greater than 50% was face-to-face time counseling regarding the below diagnosis.  Impression and Recommendations:   This case required medical decision making of moderate complexity.

## 2012-06-14 ENCOUNTER — Other Ambulatory Visit: Payer: Self-pay | Admitting: Sports Medicine

## 2012-06-14 ENCOUNTER — Encounter: Payer: BC Managed Care – PPO | Admitting: Sports Medicine

## 2012-06-14 DIAGNOSIS — B353 Tinea pedis: Secondary | ICD-10-CM

## 2012-06-14 MED ORDER — CLOTRIMAZOLE-BETAMETHASONE 1-0.05 % EX CREA: TOPICAL_CREAM | Freq: Two times a day (BID) | CUTANEOUS | Status: DC

## 2012-06-15 ENCOUNTER — Other Ambulatory Visit: Payer: Self-pay | Admitting: Sports Medicine

## 2012-06-17 MED ORDER — CLOTRIMAZOLE-BETAMETHASONE 1-0.05 % EX CREA: TOPICAL_CREAM | Freq: Two times a day (BID) | CUTANEOUS | Status: DC

## 2012-06-17 NOTE — Telephone Encounter (Signed)
Excellent, keep using it and I look forward to seeing you on the 28th! ___________________________________________ Ihor Austin. Benjamin Stain, M.D., ABFM., CAQSM. Primary Care and Sports Medicine [facility]  Adjunct Instructor of Family Medicine  [employer]

## 2012-06-18 ENCOUNTER — Encounter: Payer: Self-pay | Admitting: Sports Medicine

## 2012-06-19 MED ORDER — DIAZEPAM 5 MG PO TABS: ORAL_TABLET | ORAL | Status: DC

## 2012-06-21 ENCOUNTER — Ambulatory Visit (INDEPENDENT_AMBULATORY_CARE_PROVIDER_SITE_OTHER): Payer: BC Managed Care – PPO | Admitting: Sports Medicine

## 2012-06-21 ENCOUNTER — Encounter: Payer: Self-pay | Admitting: Sports Medicine

## 2012-06-21 VITALS — BP 112/69 | HR 83 | Wt 165.0 lb

## 2012-06-21 DIAGNOSIS — B070 Plantar wart: Secondary | ICD-10-CM

## 2012-06-21 MED ORDER — HYDROCODONE-ACETAMINOPHEN 5-325 MG PO TABS: 1.0000 | ORAL_TABLET | Freq: Three times a day (TID) | ORAL | Status: DC | PRN

## 2012-06-21 NOTE — Progress Notes (Signed)
  Procedure:  Plantar wart/callus excision from right foot.  Risks, benefits, and alternatives explained and consent obtained. Time out conducted. Surface cleaned with alcohol. 5 cc lidocaine with epinephrine infiltrated under lesion. Adequate anesthesia ensured. Area prepped and draped in a sterile fashion. #11 blade used to excise lesion, which was 1 cm in diameter. Hemostasis achieved. Pt stable. Aftercare and follow-up advised.

## 2012-06-21 NOTE — Assessment & Plan Note (Signed)
Surgical excision performed lateral plantar wart. Wound care advised. Return as needed. Hydrocodone for pain.

## 2012-06-23 ENCOUNTER — Encounter: Payer: Self-pay | Admitting: Sports Medicine

## 2012-07-01 ENCOUNTER — Encounter: Payer: Self-pay | Admitting: Family Medicine

## 2012-07-01 ENCOUNTER — Ambulatory Visit (INDEPENDENT_AMBULATORY_CARE_PROVIDER_SITE_OTHER): Payer: BC Managed Care – PPO | Admitting: Family Medicine

## 2012-07-01 VITALS — BP 129/71 | HR 75 | Temp 98.0°F | Wt 163.0 lb

## 2012-07-01 DIAGNOSIS — J329 Chronic sinusitis, unspecified: Secondary | ICD-10-CM

## 2012-07-01 DIAGNOSIS — B9689 Other specified bacterial agents as the cause of diseases classified elsewhere: Secondary | ICD-10-CM

## 2012-07-01 DIAGNOSIS — A499 Bacterial infection, unspecified: Secondary | ICD-10-CM

## 2012-07-01 MED ORDER — AMOXICILLIN-POT CLAVULANATE 500-125 MG PO TABS: ORAL_TABLET | ORAL | Status: DC

## 2012-07-01 NOTE — Progress Notes (Signed)
CC: Stephanie Chase is a 52 y.o. female is here for nose hurts   Subjective: HPI:  Patient complains of pressure around the nose. Localized between and just below the eyes. Moderate severity. Radiates into her cheeks bilaterally. Worse when leaning forward. Mild nasal congestion with clear discharge. Has been present for about a week. Present all hours of the day but does not interfere her sleep. Symptoms have been gradually worsening on a daily basis. Denies interventions as of yet, fevers, chills, nausea, vomiting, sore throat, ocular pain, vision loss, nor motor or sensory disturbance    Review Of Systems Outlined In HPI  Past Medical History  Diagnosis Date  . Lupus   . Thyroid disease      Family History  Problem Relation Age of Onset  . Breast cancer Mother   . Diabetes Mother   . Heart attack Father   . Hyperlipidemia Father   . Hypertension Father   . Alzheimer's disease Father   . Alzheimer's disease       History  Substance Use Topics  . Smoking status: Current Every Day Smoker -- 1.00 packs/day    Types: Cigarettes  . Smokeless tobacco: Never Used  . Alcohol Use: Yes     Comment: once a month     Objective: Filed Vitals:   07/01/12 1058  BP: 129/71  Pulse: 75  Temp: 98 F (36.7 C)    General: Alert and Oriented, No Acute Distress HEENT: Pupils equal, round, reactive to light. Conjunctivae clear.  External ears unremarkable, canals clear with intact TMs with appropriate landmarks.  Middle ear appears open without effusion. Pink inferior turbinates.  Moist mucous membranes, pharynx without inflammation nor lesions.  Neck supple without palpable lymphadenopathy nor abnormal masses. Frontal and ethmoid sinus tenderness to percussion Lungs: Clear to auscultation bilaterally, no wheezing/ronchi/rales.  Comfortable work of breathing. Good air movement. Cardiac: Regular rate and rhythm. Normal S1/S2.  No murmurs, rubs, nor gallops.   Cranial nerves II through XII  grossly intact Mental Status: No depression, anxiety, nor agitation. Skin: Warm and dry.  Assessment & Plan: Mikenzi was seen today for nose hurts.  Diagnoses and associated orders for this visit:  Bacterial sinusitis - amoxicillin-clavulanate (AUGMENTIN) 500-125 MG per tablet; Take one by mouth every 8 hours for ten total days.    Suspicious of bacterial sinusitis, start Augmentin encouraged her to start nasal saline washes 3-4 times a day while discomfort persists. Signs and symptoms requring emergent/urgent reevaluation were discussed with the patient.  Return if symptoms worsen or fail to improve.

## 2012-07-08 ENCOUNTER — Other Ambulatory Visit: Payer: Self-pay | Admitting: Sports Medicine

## 2012-07-29 ENCOUNTER — Ambulatory Visit: Payer: BC Managed Care – PPO | Admitting: Sports Medicine

## 2012-07-31 ENCOUNTER — Ambulatory Visit (INDEPENDENT_AMBULATORY_CARE_PROVIDER_SITE_OTHER): Payer: BC Managed Care – PPO | Admitting: Sports Medicine

## 2012-07-31 ENCOUNTER — Encounter: Payer: Self-pay | Admitting: Sports Medicine

## 2012-07-31 VITALS — BP 134/87 | HR 92

## 2012-07-31 DIAGNOSIS — E039 Hypothyroidism, unspecified: Secondary | ICD-10-CM

## 2012-07-31 DIAGNOSIS — K3189 Other diseases of stomach and duodenum: Secondary | ICD-10-CM

## 2012-07-31 DIAGNOSIS — R1013 Epigastric pain: Secondary | ICD-10-CM | POA: Insufficient documentation

## 2012-07-31 LAB — TSH: TSH: 0.031 u[IU]/mL — ABNORMAL LOW (ref 0.350–4.500)

## 2012-07-31 LAB — CBC
HCT: 42.3 % (ref 36.0–46.0)
Hemoglobin: 14.4 g/dL (ref 12.0–15.0)
MCH: 28.9 pg (ref 26.0–34.0)
MCHC: 34 g/dL (ref 30.0–36.0)
MCV: 84.9 fL (ref 78.0–100.0)
Platelets: 275 10*3/uL (ref 150–400)
RBC: 4.98 MIL/uL (ref 3.87–5.11)
RDW: 13.7 % (ref 11.5–15.5)
WBC: 8.5 10*3/uL (ref 4.0–10.5)

## 2012-07-31 LAB — COMPREHENSIVE METABOLIC PANEL
ALT: 13 U/L (ref 0–35)
AST: 17 U/L (ref 0–37)
Albumin: 4.2 g/dL (ref 3.5–5.2)
Alkaline Phosphatase: 88 U/L (ref 39–117)
BUN: 8 mg/dL (ref 6–23)
CO2: 27 mEq/L (ref 19–32)
Calcium: 10.3 mg/dL (ref 8.4–10.5)
Chloride: 106 mEq/L (ref 96–112)
Creat: 0.65 mg/dL (ref 0.50–1.10)
Glucose, Bld: 84 mg/dL (ref 70–99)
Potassium: 4 mEq/L (ref 3.5–5.3)
Sodium: 141 mEq/L (ref 135–145)
Total Bilirubin: 0.3 mg/dL (ref 0.3–1.2)
Total Protein: 7 g/dL (ref 6.0–8.3)

## 2012-07-31 LAB — LIPASE: Lipase: 55 U/L (ref 0–75)

## 2012-07-31 MED ORDER — OMEPRAZOLE 40 MG PO CPDR: 40.0000 mg | DELAYED_RELEASE_CAPSULE | Freq: Two times a day (BID) | ORAL | Status: DC

## 2012-07-31 MED ORDER — RANITIDINE HCL 300 MG PO TABS: 300.0000 mg | ORAL_TABLET | Freq: Two times a day (BID) | ORAL | Status: DC

## 2012-07-31 NOTE — Assessment & Plan Note (Signed)
Zantac 300mg  BID, Prilosec 40 BID. H pylori, CMET, CBC.

## 2012-07-31 NOTE — Progress Notes (Signed)
  Subjective:    CC: Chest pain  HPI: Stephanie Chase is a very pleasant 52 year old female with a history of hypothyroidism who comes in with a five-day history of epigastric burning, reading substernal, and causing hoarseness. She denies any nausea, vomiting, pain is worse at night and when she wakes up in the morning, there is no association with exertion, no radiation down the arms. She does get a similar sensation between her shoulder blades. She does clear her throat a lot during the day and is wondering of this could be due to asthma or allergies. She denies any melena, or hematochezia, dizziness, lightheadedness. Pain is not worse or better with food. She did get a little better when taking about 6 Tums.  Past medical history, Surgical history, Family history not pertinant except as noted below, Social history, Allergies, and medications have been entered into the medical record, reviewed, and no changes needed.   Review of Systems: No fevers, chills, night sweats, weight loss, chest pain, or shortness of breath.   Objective:    General: Well Developed, well nourished, and in no acute distress.  Neuro: Alert and oriented x3, extra-ocular muscles intact, sensation grossly intact.  HEENT: Normocephalic, atraumatic, pupils equal round reactive to light, neck supple, no masses, no lymphadenopathy, thyroid nonpalpable.  Skin: Warm and dry, no rashes. Cardiac: Regular rate and rhythm, no murmurs rubs or gallops, no lower extremity edema.  Respiratory: Clear to auscultation bilaterally. Not using accessory muscles, speaking in full sentences. Abdomen: Soft, nontender, nondistended, normal bowel sounds, no palpable masses, no costovertebral angle tenderness.   Impression and Recommendations:

## 2012-07-31 NOTE — Assessment & Plan Note (Signed)
Recently changed liothyronine dose. Rechecking TSH.

## 2012-08-01 LAB — H. PYLORI ANTIBODY, IGG: H Pylori IgG: 5.26 {ISR} — ABNORMAL HIGH

## 2012-08-02 MED ORDER — AMOXICILL-CLARITHRO-LANSOPRAZ PO MISC: Freq: Two times a day (BID) | ORAL | Status: DC

## 2012-08-02 MED ORDER — LEVOTHYROXINE SODIUM 88 MCG PO TABS: 88.0000 ug | ORAL_TABLET | Freq: Every day | ORAL | Status: DC

## 2012-08-02 NOTE — Addendum Note (Signed)
Addended by: Monica Becton on: 08/02/2012 08:52 AM   Modules accepted: Orders

## 2012-09-02 ENCOUNTER — Encounter: Payer: Self-pay | Admitting: Sports Medicine

## 2012-11-20 ENCOUNTER — Other Ambulatory Visit: Payer: Self-pay | Admitting: Sports Medicine

## 2013-01-30 ENCOUNTER — Other Ambulatory Visit: Payer: Self-pay

## 2013-03-11 ENCOUNTER — Other Ambulatory Visit: Payer: Self-pay

## 2013-03-11 DIAGNOSIS — Z1231 Encounter for screening mammogram for malignant neoplasm of breast: Secondary | ICD-10-CM

## 2013-04-08 ENCOUNTER — Ambulatory Visit: Payer: BC Managed Care – PPO

## 2013-04-22 ENCOUNTER — Ambulatory Visit
Admission: RE | Admit: 2013-04-22 | Discharge: 2013-04-22 | Disposition: A | Discharge status: Home / Self Care | Payer: BC Managed Care – PPO | Source: Ambulatory Visit

## 2013-04-22 DIAGNOSIS — Z1231 Encounter for screening mammogram for malignant neoplasm of breast: Secondary | ICD-10-CM

## 2013-04-22 IMAGING — MG STANDARD SCREENING - COMBO
6 of 9 series · 6 of 25 positions shown · non-contrast
Comparison: Previous exam(s).

CLINICAL DATA: Screening.

EXAM:
DIGITAL SCREENING BILATERAL MAMMOGRAM WITH 3D TOMO WITH CAD
DIGITAL BREAST TOMOSYNTHESIS
Digital breast tomosynthesis images are acquired in two projections.
These images are reviewed in combination with the digital mammogram,
confirming the findings below.

[L MLO (1 of 2)]
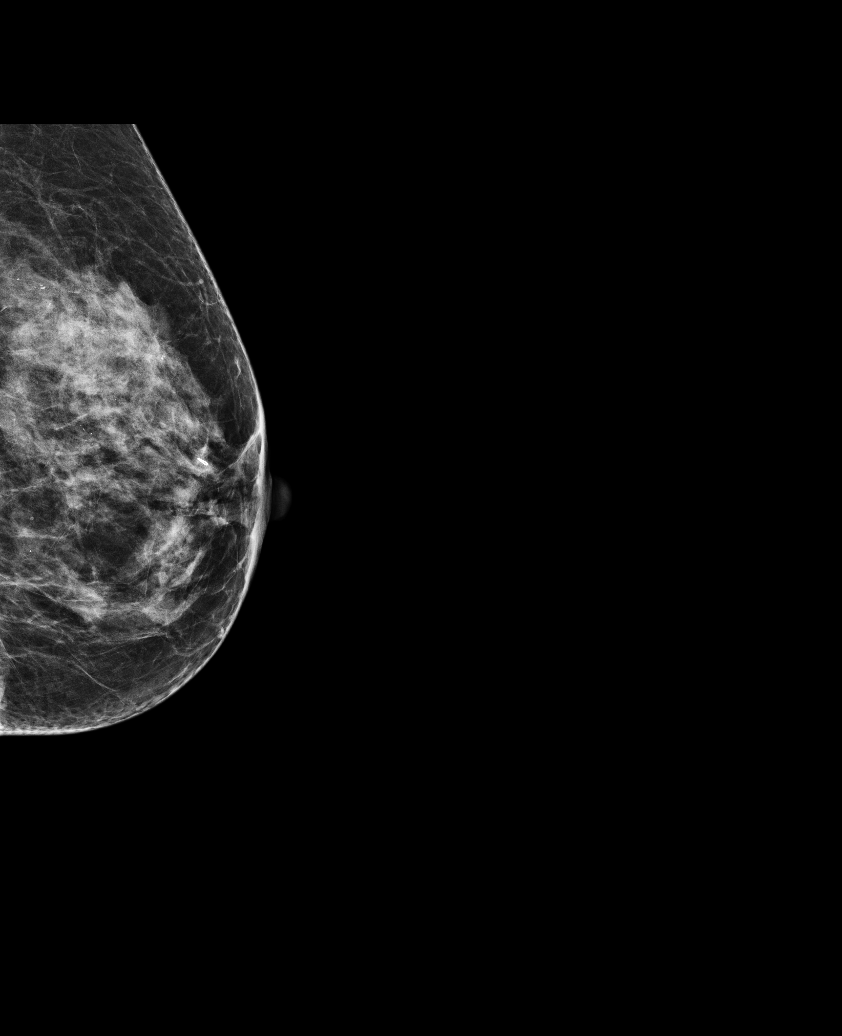

[L CC]
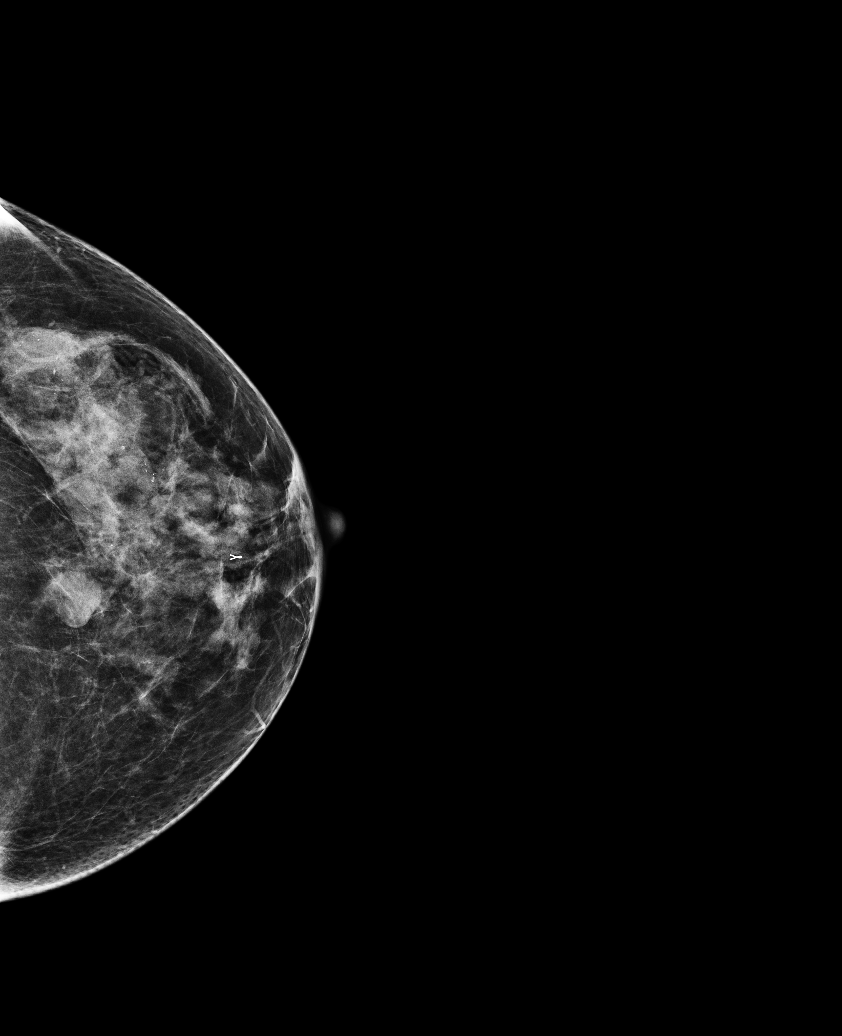

[R MLO]
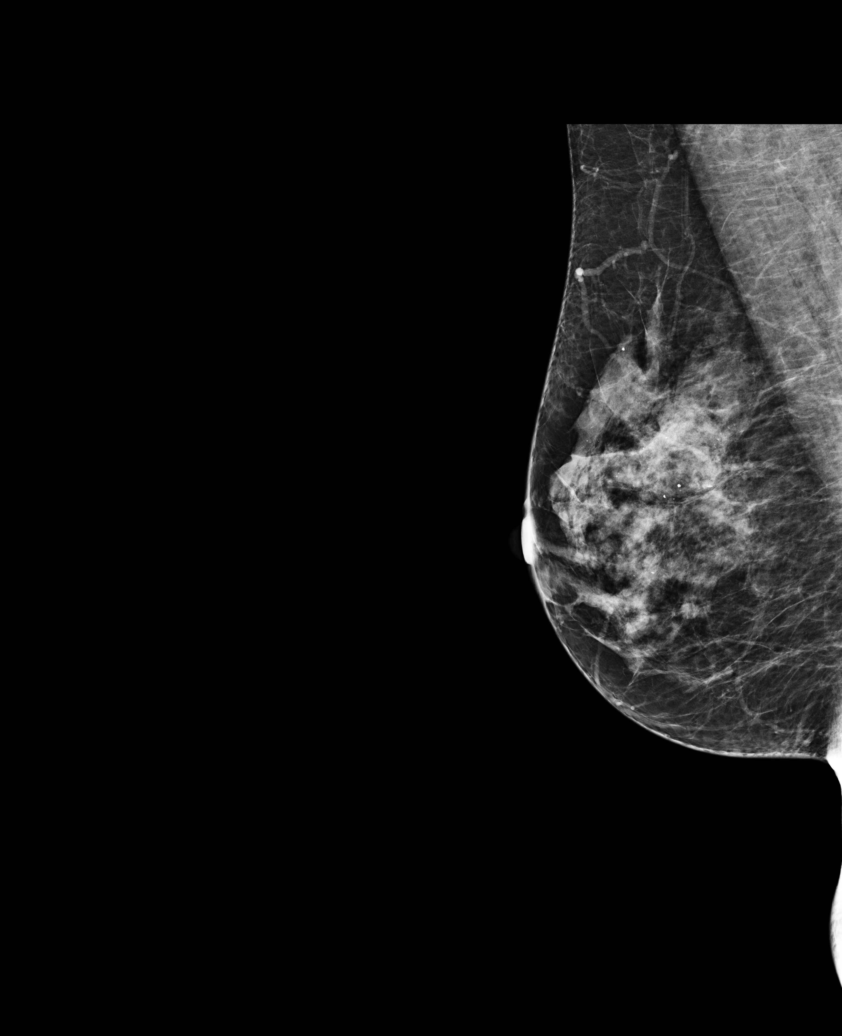

[R CC]
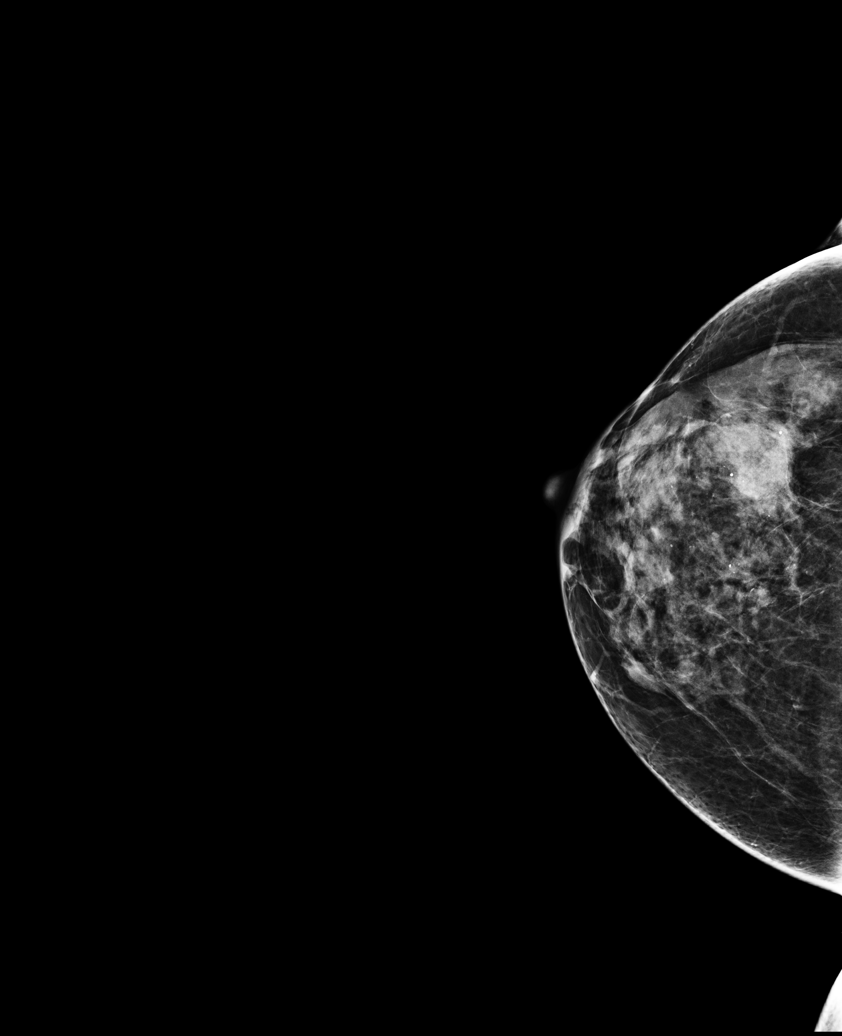

[L MLO (2 of 2)]
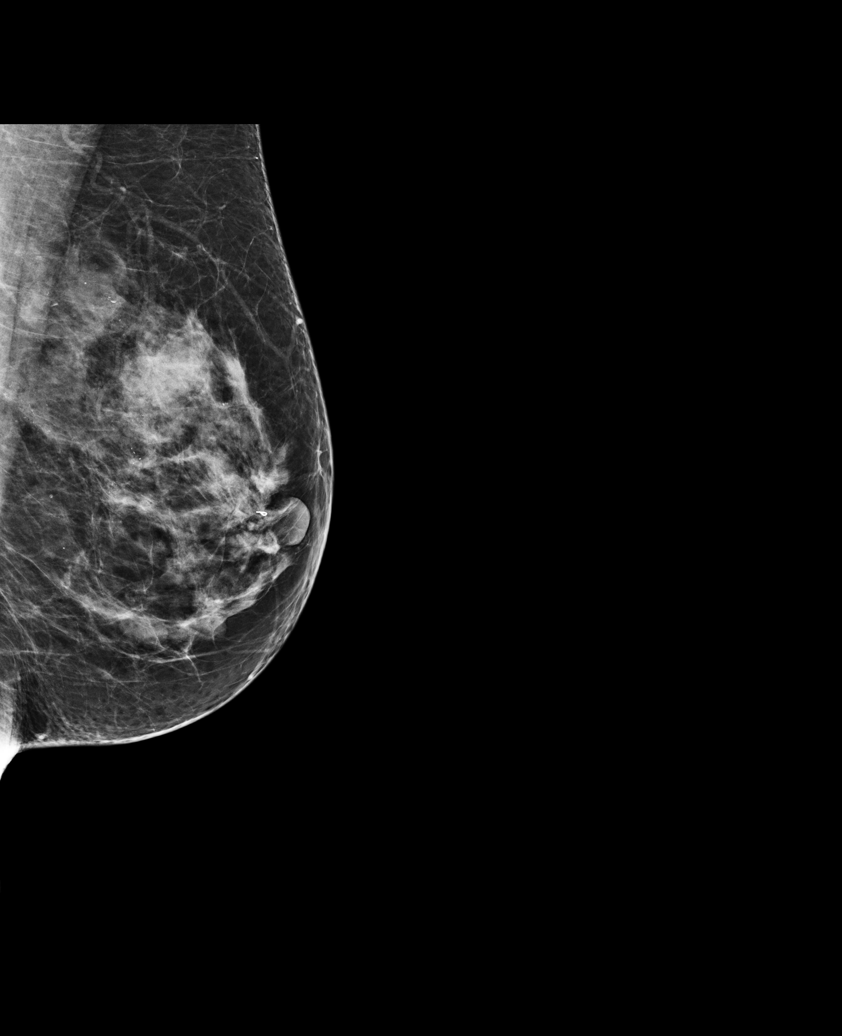

[L MLO tomo · tomo slice 31/61.0]
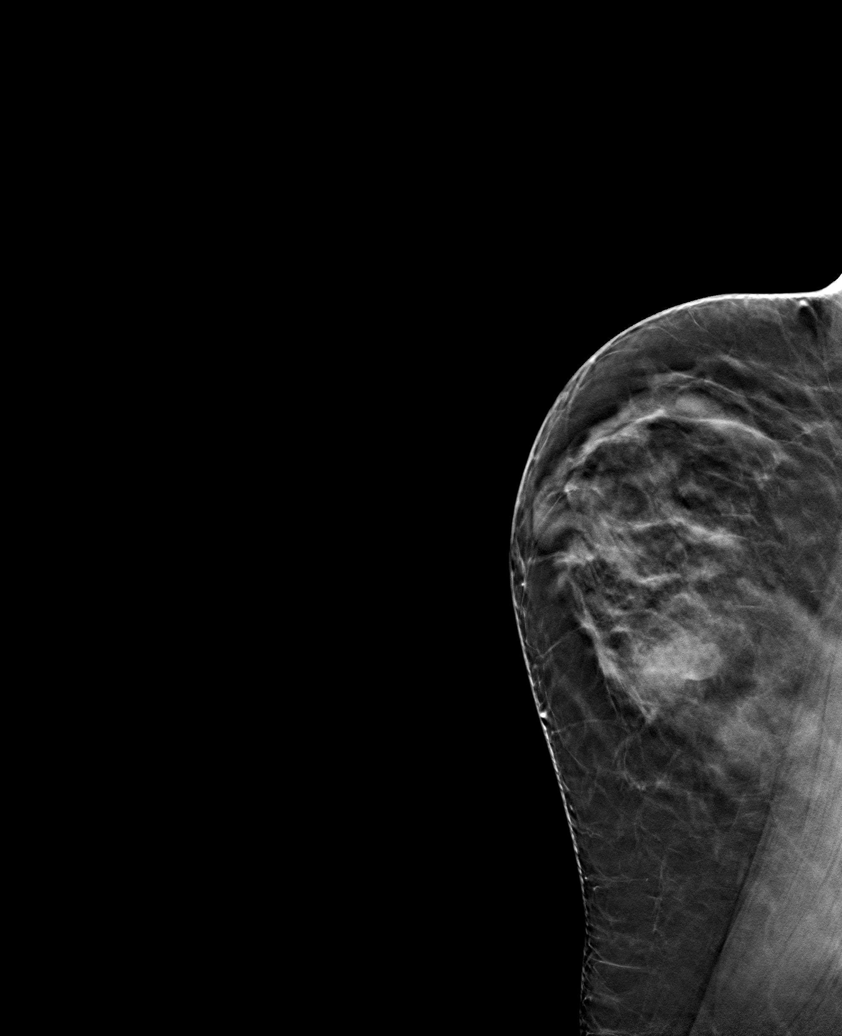

[6 of 25 positions shown; findings below may reference images not displayed]

ACR Breast Density Category c: The breast tissue is heterogeneously
dense, which may obscure small masses.
FINDINGS: There are no findings suspicious for malignancy. Images were
processed with CAD.
IMPRESSION: No mammographic evidence of malignancy. A result letter of this
screening mammogram will be mailed directly to the patient.

RECOMMENDATION:
Screening mammogram in one year. (Code:[LL])

BI-RADS CATEGORY  1: Negative.

## 2013-05-26 ENCOUNTER — Encounter: Payer: Self-pay | Admitting: Sports Medicine

## 2013-05-26 ENCOUNTER — Ambulatory Visit (INDEPENDENT_AMBULATORY_CARE_PROVIDER_SITE_OTHER): Payer: BC Managed Care – PPO | Admitting: Sports Medicine

## 2013-05-26 VITALS — BP 129/84 | HR 85 | Ht 66.0 in | Wt 165.0 lb

## 2013-05-26 DIAGNOSIS — J329 Chronic sinusitis, unspecified: Secondary | ICD-10-CM

## 2013-05-26 DIAGNOSIS — E039 Hypothyroidism, unspecified: Secondary | ICD-10-CM

## 2013-05-26 MED ORDER — AMOXICILLIN-POT CLAVULANATE 875-125 MG PO TABS: 1.0000 | ORAL_TABLET | Freq: Two times a day (BID) | ORAL | Status: DC

## 2013-05-26 MED ORDER — FLUTICASONE PROPIONATE 50 MCG/ACT NA SUSP: NASAL | Status: DC

## 2013-05-26 MED ORDER — LEVOTHYROXINE SODIUM 88 MCG PO TABS: 88.0000 ug | ORAL_TABLET | Freq: Every day | ORAL | Status: DC

## 2013-05-26 NOTE — Assessment & Plan Note (Signed)
TSH was low suggesting iatrogenic hyperthyroidism. She actually never decreased to 88 mcg Synthroid. She is going to do it micrograms of Synthroid, and 10 mg of Cytomel daily. We can recheck in about 6 weeks. She does continue to have some fatigue, this is unlikely coming from the thyroid and I would like a dedicated visit to discuss fatigue.

## 2013-05-26 NOTE — Progress Notes (Signed)
  Subjective:    CC: Sinus pain  HPI: Patient is a pleasant 53 yo female who presents with sinus pain since last Wednesday. She has a previous history of sinus infection and this feels the same. She has facial pain and pressure. She has some ear pain and sometimes has pain behind the orbit. Has tried tylenol sinus with some relief. No cough or runny nose. No changes in vision. Symptoms are moderate, persistent.  Past medical history, Surgical history, Family history not pertinant except as noted below, Social history, Allergies, and medications have been entered into the medical record, reviewed, and no changes needed.   Review of Systems: No fevers, chills, night sweats, weight loss, chest pain, or shortness of breath.   Objective:    General: Well Developed, well nourished, and in no acute distress.  Neuro: Alert and oriented x3, extra-ocular muscles intact, sensation grossly intact.  HEENT: Normocephalic, atraumatic, pupils equal round reactive to light, neck supple, no masses, no lymphadenopathy, thyroid nonpalpable. Tender to palpation over the maxillary sinuses, oropharynx, nasopharynx, external ear canals are unremarkable. Skin: Warm and dry, no rashes. Cardiac: Regular rate and rhythm, no murmurs rubs or gallops, no lower extremity edema.  Respiratory: Clear to auscultation bilaterally. Not using accessory muscles, speaking in full sentences.  Impression and Recommendations:

## 2013-05-26 NOTE — Assessment & Plan Note (Signed)
Augmentin, Flonase. 

## 2013-05-27 ENCOUNTER — Encounter: Payer: Self-pay | Admitting: Sports Medicine

## 2013-05-27 ENCOUNTER — Ambulatory Visit (INDEPENDENT_AMBULATORY_CARE_PROVIDER_SITE_OTHER): Payer: BC Managed Care – PPO | Admitting: Sports Medicine

## 2013-05-27 VITALS — BP 112/79 | HR 85 | Ht 66.0 in | Wt 166.0 lb

## 2013-05-27 DIAGNOSIS — H00019 Hordeolum externum unspecified eye, unspecified eyelid: Secondary | ICD-10-CM

## 2013-05-27 MED ORDER — DOXYCYCLINE HYCLATE 100 MG PO TABS: 100.0000 mg | ORAL_TABLET | Freq: Two times a day (BID) | ORAL | Status: AC

## 2013-05-27 MED ORDER — ERYTHROMYCIN 5 MG/GM OP OINT: 1.0000 "application " | TOPICAL_OINTMENT | Freq: Three times a day (TID) | OPHTHALMIC | Status: AC

## 2013-05-27 NOTE — Progress Notes (Signed)
  Subjective:    CC: Eyelid pain  HPI:  Patient is a pleasant 53 yo female who presents with one day of left eye pain. She woke up this morning with a swollen left upper eyelid with the most pain in the lateral eye. She has not pain with eye movement. No changes in vision. Still has some right sinus pain and pressure similar to her visit yesterday. Pain is moderate, persistent.  Past medical history, Surgical history, Family history not pertinant except as noted below, Social history, Allergies, and medications have been entered into the medical record, reviewed, and no changes needed.   Review of Systems: No fevers, chills, night sweats, weight loss, chest pain, or shortness of breath.   Objective:    General: Well Developed, well nourished, and in no acute distress.  Neuro: Alert and oriented x3, extra-ocular muscles intact, sensation grossly intact.  HEENT: Normocephalic, atraumatic, pupils equal round reactive to light. Swelling and erythema over the lateral left eyelid. Small nodular lesion on the lateral upper lid. Neck supple, no masses, no lymphadenopathy, thyroid nonpalpable.  Skin: Warm and dry, no rashes.  Cardiac: Regular rate and rhythm, no murmurs rubs or gallops, no lower extremity edema.  Respiratory: Clear to auscultation bilaterally. Not using accessory muscles, speaking in full sentences.  Impression and Recommendations:

## 2013-05-27 NOTE — Assessment & Plan Note (Signed)
Doxycycline, topical erythromycin, topical warm compresses. Return as needed for this.

## 2013-05-27 NOTE — Patient Instructions (Signed)
Sty A sty (hordeolum) is an infection of a gland in the eyelid located at the base of the eyelash. A sty may develop a white or yellow head of pus. It can be puffy (swollen). Usually, the sty will burst and pus will come out on its own. They do not leave lumps in the eyelid once they drain. A sty is often confused with another form of cyst of the eyelid called a chalazion. Chalazions occur within the eyelid and not on the edge where the bases of the eyelashes are. They often are red, sore and then form firm lumps in the eyelid. CAUSES   Germs (bacteria).  Lasting (chronic) eyelid inflammation. SYMPTOMS   Tenderness, redness and swelling along the edge of the eyelid at the base of the eyelashes.  Sometimes, there is a white or yellow head of pus. It may or may not drain. DIAGNOSIS  An ophthalmologist will be able to distinguish between a sty and a chalazion and treat the condition appropriately.  TREATMENT   Styes are typically treated with warm packs (compresses) until drainage occurs.  In rare cases, medicines that kill germs (antibiotics) may be prescribed. These antibiotics may be in the form of drops, cream or pills.  If a hard lump has formed, it is generally necessary to do a small incision and remove the hardened contents of the cyst in a minor surgical procedure done in the office.  In suspicious cases, your caregiver may send the contents of the cyst to the lab to be certain that it is not a rare, but dangerous form of cancer of the glands of the eyelid. HOME CARE INSTRUCTIONS   Wash your hands often and dry them with a clean towel. Avoid touching your eyelid. This may spread the infection to other parts of the eye.  Apply heat to your eyelid for 10 to 20 minutes, several times a day, to ease pain and help to heal it faster.  Do not squeeze the sty. Allow it to drain on its own. Wash your eyelid carefully 3 to 4 times per day to remove any pus. SEEK IMMEDIATE MEDICAL CARE IF:    Your eye becomes painful or puffy (swollen).  Your vision changes.  Your sty does not drain by itself within 3 days.  Your sty comes back within a short period of time, even with treatment.  You have redness (inflammation) around the eye.  You have a fever. Document Released: 12/21/2004 Document Revised: 06/05/2011 Document Reviewed: 08/25/2008 [employer] Patient Information 2014 [employer].  

## 2013-05-29 ENCOUNTER — Encounter: Payer: Self-pay | Admitting: Sports Medicine

## 2013-07-31 ENCOUNTER — Other Ambulatory Visit: Payer: Self-pay | Admitting: Sports Medicine

## 2013-09-29 ENCOUNTER — Ambulatory Visit (INDEPENDENT_AMBULATORY_CARE_PROVIDER_SITE_OTHER): Payer: BC Managed Care – PPO | Admitting: Physician Assistant

## 2013-09-29 ENCOUNTER — Encounter: Payer: Self-pay | Admitting: Physician Assistant

## 2013-09-29 VITALS — BP 107/70 | HR 69 | Ht 66.0 in | Wt 161.0 lb

## 2013-09-29 DIAGNOSIS — H00019 Hordeolum externum unspecified eye, unspecified eyelid: Secondary | ICD-10-CM

## 2013-09-29 DIAGNOSIS — E039 Hypothyroidism, unspecified: Secondary | ICD-10-CM

## 2013-09-29 DIAGNOSIS — H00016 Hordeolum externum left eye, unspecified eyelid: Secondary | ICD-10-CM

## 2013-09-29 DIAGNOSIS — Z79899 Other long term (current) drug therapy: Secondary | ICD-10-CM

## 2013-09-29 DIAGNOSIS — B351 Tinea unguium: Secondary | ICD-10-CM

## 2013-09-29 MED ORDER — DOXYCYCLINE HYCLATE 100 MG PO TABS: 100.0000 mg | ORAL_TABLET | Freq: Two times a day (BID) | ORAL | Status: DC

## 2013-09-29 MED ORDER — ERYTHROMYCIN 5 MG/GM OP OINT
1.0000 | TOPICAL_OINTMENT | Freq: Three times a day (TID) | OPHTHALMIC | Status: AC
Start: 2013-09-29 — End: 2013-10-04

## 2013-09-29 MED ORDER — TERBINAFINE HCL 250 MG PO TABS: 250.0000 mg | ORAL_TABLET | Freq: Every day | ORAL | Status: DC

## 2013-09-29 NOTE — Patient Instructions (Signed)
Sty A sty (hordeolum) is an infection of a gland in the eyelid located at the base of the eyelash. A sty may develop a white or yellow head of pus. It can be puffy (swollen). Usually, the sty will burst and pus will come out on its own. They do not leave lumps in the eyelid once they drain. A sty is often confused with another form of cyst of the eyelid called a chalazion. Chalazions occur within the eyelid and not on the edge where the bases of the eyelashes are. They often are red, sore and then form firm lumps in the eyelid. CAUSES   Germs (bacteria).  Lasting (chronic) eyelid inflammation. SYMPTOMS   Tenderness, redness and swelling along the edge of the eyelid at the base of the eyelashes.  Sometimes, there is a white or yellow head of pus. It may or may not drain. DIAGNOSIS  An ophthalmologist will be able to distinguish between a sty and a chalazion and treat the condition appropriately.  TREATMENT   Styes are typically treated with warm packs (compresses) until drainage occurs.  In rare cases, medicines that kill germs (antibiotics) may be prescribed. These antibiotics may be in the form of drops, cream or pills.  If a hard lump has formed, it is generally necessary to do a small incision and remove the hardened contents of the cyst in a minor surgical procedure done in the office.  In suspicious cases, your caregiver may send the contents of the cyst to the lab to be certain that it is not a rare, but dangerous form of cancer of the glands of the eyelid. HOME CARE INSTRUCTIONS   Wash your hands often and dry them with a clean towel. Avoid touching your eyelid. This may spread the infection to other parts of the eye.  Apply heat to your eyelid for 10 to 20 minutes, several times a day, to ease pain and help to heal it faster.  Do not squeeze the sty. Allow it to drain on its own. Wash your eyelid carefully 3 to 4 times per day to remove any pus. SEEK IMMEDIATE MEDICAL CARE IF:    Your eye becomes painful or puffy (swollen).  Your vision changes.  Your sty does not drain by itself within 3 days.  Your sty comes back within a short period of time, even with treatment.  You have redness (inflammation) around the eye.  You have a fever. Document Released: 12/21/2004 Document Revised: 06/05/2011 Document Reviewed: 08/25/2008 [facility] Patient Information 2015 [place], [place]. This information is not intended to replace advice given to you by your health care provider. Make sure you discuss any questions you have with your health care provider.

## 2013-09-29 NOTE — Progress Notes (Addendum)
   Subjective:    Patient ID: Stephanie Chase, female    DOB: 1960/07/22, 53 y.o.   MRN: [id number]  HPI Pt is a 53 yo female who presents to the clinic with left eye nodule and burning sensation for 5 days. Not tried anything to make better. Nothing seems to make worse. No fever, chills, eye drainage, sinus pressure, ear pain or ST. No vision changes.   Hypothyroidism- needs refills.   Noticed thick, yellow toenails bilateral feet. Nothing tried. Present for many months.   Review of Systems  All other systems reviewed and are negative.      Objective:   Physical Exam  Constitutional: She is oriented to person, place, and time. She appears well-developed and well-nourished.  HENT:  Head: Normocephalic and atraumatic.  Right Ear: External ear normal.  Left Ear: External ear normal.  Nose: Nose normal.  Mouth/Throat: Oropharynx is clear and moist. No oropharyngeal exudate.  Eyes: Conjunctivae and EOM are normal. Pupils are equal, round, and reactive to light. Right eye exhibits no discharge. Left eye exhibits no discharge.    Neck: Normal range of motion. Neck supple.  Cardiovascular: Normal rate, regular rhythm and normal heart sounds.   Pulmonary/Chest: Effort normal and breath sounds normal. She has no wheezes.  Lymphadenopathy:    She has no cervical adenopathy.  Neurological: She is alert and oriented to person, place, and time.  Skin: Skin is dry.  Thick, yellow harden toenails bilateral feet both great toes most involved.  Psychiatric: She has a normal mood and affect. Her behavior is normal.          Assessment & Plan:  Stye- treated with warm compresses, massage and erythromycin ointment. HO given. Ibuprofen for discomfort. If swelling continues, pain worsens, discharge increases doxycycline rx was given to start. If improving with conservation therapy do not add doxycycline.   Hypothyroidism- pt needs refill and been over a year since labs. Labs ordered will  change medication accordingly.   Onychomycosis- will check liver enzymes today before starting lamisil. Printed lamisil to start pending normal labs.

## 2013-09-30 ENCOUNTER — Other Ambulatory Visit: Payer: Self-pay | Admitting: Physician Assistant

## 2013-09-30 LAB — COMPLETE METABOLIC PANEL WITH GFR
ALT: 8 U/L (ref 0–35)
AST: 18 U/L (ref 0–37)
Albumin: 4 g/dL (ref 3.5–5.2)
Alkaline Phosphatase: 84 U/L (ref 39–117)
BUN: 7 mg/dL (ref 6–23)
CO2: 27 mEq/L (ref 19–32)
Calcium: 9.7 mg/dL (ref 8.4–10.5)
Chloride: 105 mEq/L (ref 96–112)
Creat: 0.58 mg/dL (ref 0.50–1.10)
GFR, Est African American: >89 mL/min
GFR, Est Non African American: >89 mL/min
Glucose, Bld: 83 mg/dL (ref 70–99)
Potassium: 3.8 mEq/L (ref 3.5–5.3)
Sodium: 137 mEq/L (ref 135–145)
Total Bilirubin: 0.2 mg/dL (ref 0.2–1.2)
Total Protein: 6.5 g/dL (ref 6.0–8.3)

## 2013-09-30 LAB — T4, FREE: Free T4: 1.2 ng/dL (ref 0.80–1.80)

## 2013-09-30 LAB — TSH: TSH: 0.097 u[IU]/mL — ABNORMAL LOW (ref 0.350–4.500)

## 2013-09-30 MED ORDER — LEVOTHYROXINE SODIUM 75 MCG PO TABS: 75.0000 ug | ORAL_TABLET | Freq: Every day | ORAL | Status: DC

## 2013-09-30 MED ORDER — LIOTHYRONINE SODIUM 5 MCG PO TABS: ORAL_TABLET | ORAL | Status: DC

## 2013-10-09 ENCOUNTER — Telehealth: Payer: Self-pay | Admitting: *Deleted

## 2013-10-09 NOTE — Telephone Encounter (Signed)
When did you start abx? How many days are you in to the doxycycline?

## 2013-10-09 NOTE — Telephone Encounter (Signed)
Pt left vm that she has become very nauseated & very light sensitive since starting the abx.  Her eye is getting better slowly. She is wanting to know if you can send her in a different abx.  Please advise.

## 2013-10-10 ENCOUNTER — Other Ambulatory Visit: Payer: Self-pay | Admitting: Physician Assistant

## 2013-10-10 NOTE — Telephone Encounter (Signed)
Left detailed message on vm.

## 2013-10-10 NOTE — Telephone Encounter (Signed)
Pt states she has 4 days left of the abx.

## 2013-10-10 NOTE — Telephone Encounter (Signed)
With only 2 days left of abx you should not need another abx. You can continue to use topical abx of erythromycin ointment with gentle massage and warm compress.

## 2013-10-16 ENCOUNTER — Encounter: Payer: Self-pay | Admitting: Sports Medicine

## 2013-10-16 MED ORDER — PREDNISONE (PAK) 10 MG PO TABS: ORAL_TABLET | ORAL | Status: DC

## 2013-11-17 ENCOUNTER — Encounter: Payer: Self-pay | Admitting: Sports Medicine

## 2013-11-17 DIAGNOSIS — E039 Hypothyroidism, unspecified: Secondary | ICD-10-CM

## 2013-11-26 ENCOUNTER — Encounter: Payer: Self-pay | Admitting: Sports Medicine

## 2013-11-26 ENCOUNTER — Other Ambulatory Visit: Payer: Self-pay | Admitting: Physician Assistant

## 2013-11-26 LAB — T4, FREE: Free T4: 1.13 ng/dL (ref 0.80–1.80)

## 2013-11-26 LAB — T3, FREE: T3, Free: 4.3 pg/mL — ABNORMAL HIGH (ref 2.3–4.2)

## 2013-11-26 LAB — TSH: TSH: 0.589 u[IU]/mL (ref 0.350–4.500)

## 2013-12-26 ENCOUNTER — Encounter: Payer: Self-pay | Admitting: Sports Medicine

## 2013-12-26 ENCOUNTER — Other Ambulatory Visit: Payer: Self-pay | Admitting: Physician Assistant

## 2013-12-29 ENCOUNTER — Other Ambulatory Visit: Payer: Self-pay

## 2013-12-29 MED ORDER — SYNTHROID 75 MCG PO TABS: ORAL_TABLET | ORAL | Status: DC

## 2013-12-29 NOTE — Telephone Encounter (Signed)
Patient request refill for Synthroid 75 mcg.Routing email to PCP because patient hasn't had OV sine July. Rhonda Cunningham,CMA

## 2014-01-16 ENCOUNTER — Other Ambulatory Visit: Payer: Self-pay | Admitting: Physician Assistant

## 2014-01-24 ENCOUNTER — Other Ambulatory Visit: Payer: Self-pay | Admitting: Physician Assistant

## 2014-01-26 ENCOUNTER — Encounter: Payer: Self-pay | Admitting: Physician Assistant

## 2014-03-13 ENCOUNTER — Other Ambulatory Visit: Payer: Self-pay

## 2014-03-13 ENCOUNTER — Encounter: Payer: Self-pay | Admitting: Sports Medicine

## 2014-03-13 DIAGNOSIS — R5382 Chronic fatigue, unspecified: Secondary | ICD-10-CM

## 2014-03-13 DIAGNOSIS — Z1231 Encounter for screening mammogram for malignant neoplasm of breast: Secondary | ICD-10-CM

## 2014-03-27 HISTORY — PX: ABLATION ON ENDOMETRIOSIS: SHX5787

## 2014-03-27 LAB — CBC
HCT: 44.8 % (ref 36.0–46.0)
Hemoglobin: 15 g/dL (ref 12.0–15.0)
MCH: 29.2 pg (ref 26.0–34.0)
MCHC: 33.5 g/dL (ref 30.0–36.0)
MCV: 87.2 fL (ref 78.0–100.0)
MPV: 10.3 fL (ref 9.4–12.4)
Platelets: 296 10*3/uL (ref 150–400)
RBC: 5.14 MIL/uL — ABNORMAL HIGH (ref 3.87–5.11)
RDW: 13.8 % (ref 11.5–15.5)
WBC: 6.5 10*3/uL (ref 4.0–10.5)

## 2014-03-27 LAB — COMPREHENSIVE METABOLIC PANEL
ALT: 10 U/L (ref 0–35)
AST: 17 U/L (ref 0–37)
Albumin: 4.1 g/dL (ref 3.5–5.2)
Alkaline Phosphatase: 87 U/L (ref 39–117)
BUN: 10 mg/dL (ref 6–23)
CO2: 26 mEq/L (ref 19–32)
Calcium: 10.4 mg/dL (ref 8.4–10.5)
Chloride: 105 mEq/L (ref 96–112)
Creat: 0.73 mg/dL (ref 0.50–1.10)
Glucose, Bld: 85 mg/dL (ref 70–99)
Potassium: 4.2 mEq/L (ref 3.5–5.3)
Sodium: 140 mEq/L (ref 135–145)
Total Bilirubin: 0.4 mg/dL (ref 0.2–1.2)
Total Protein: 6.9 g/dL (ref 6.0–8.3)

## 2014-03-27 LAB — VITAMIN D 25 HYDROXY (VIT D DEFICIENCY, FRACTURES): Vit D, 25-Hydroxy: 21 ng/mL — ABNORMAL LOW (ref 30–100)

## 2014-03-27 LAB — FOLATE: Folate: 14.8 ng/mL

## 2014-03-27 LAB — LIPID PANEL
Cholesterol: 214 mg/dL — ABNORMAL HIGH (ref 0–200)
HDL: 48 mg/dL (ref >39)
LDL Cholesterol: 131 mg/dL — ABNORMAL HIGH (ref 0–99)
Total CHOL/HDL Ratio: 4.5 Ratio
Triglycerides: 175 mg/dL — ABNORMAL HIGH (ref <150)
VLDL: 35 mg/dL (ref 0–40)

## 2014-03-27 LAB — HEMOGLOBIN A1C
Hgb A1c MFr Bld: 5.5 % (ref <5.7)
Mean Plasma Glucose: 111 mg/dL (ref <117)

## 2014-03-27 LAB — T4, FREE: Free T4: 0.89 ng/dL (ref 0.80–1.80)

## 2014-03-27 LAB — VITAMIN B12: Vitamin B-12: 754 pg/mL (ref 211–911)

## 2014-03-27 LAB — T3, FREE: T3, Free: 3.1 pg/mL (ref 2.3–4.2)

## 2014-03-27 LAB — TSH: TSH: 0.981 u[IU]/mL (ref 0.350–4.500)

## 2014-03-30 ENCOUNTER — Other Ambulatory Visit: Payer: Self-pay | Admitting: Sports Medicine

## 2014-03-30 DIAGNOSIS — E785 Hyperlipidemia, unspecified: Secondary | ICD-10-CM | POA: Insufficient documentation

## 2014-03-30 HISTORY — DX: Hyperlipidemia, unspecified: E78.5

## 2014-03-30 MED ORDER — VITAMIN D (ERGOCALCIFEROL) 1.25 MG (50000 UNIT) PO CAPS: 50000.0000 [IU] | ORAL_CAPSULE | ORAL | Status: DC

## 2014-03-30 NOTE — Assessment & Plan Note (Signed)
Dietary modification and recheck in 3 months.

## 2014-04-23 ENCOUNTER — Other Ambulatory Visit: Payer: Self-pay | Admitting: Physician Assistant

## 2014-04-23 ENCOUNTER — Other Ambulatory Visit: Payer: Self-pay | Admitting: Sports Medicine

## 2014-04-23 NOTE — Telephone Encounter (Signed)
This medication was prescribed by [facility]. Do you want patient to continue on medication?

## 2014-04-24 ENCOUNTER — Other Ambulatory Visit: Payer: Self-pay | Admitting: Obstetrics and Gynecology

## 2014-04-24 ENCOUNTER — Ambulatory Visit
Admission: RE | Admit: 2014-04-24 | Discharge: 2014-04-24 | Disposition: A | Discharge status: Home / Self Care | Payer: BLUE CROSS/BLUE SHIELD | Source: Ambulatory Visit

## 2014-04-24 DIAGNOSIS — Z1231 Encounter for screening mammogram for malignant neoplasm of breast: Secondary | ICD-10-CM

## 2014-04-24 DIAGNOSIS — R928 Other abnormal and inconclusive findings on diagnostic imaging of breast: Secondary | ICD-10-CM

## 2014-04-24 IMAGING — MG MM SCREENING BREAST TOMO BILATERAL
8 series · 9 of 24 positions shown · non-contrast
Comparison: Previous exam(s)

CLINICAL DATA: Screening.

EXAM:
DIGITAL SCREENING BILATERAL MAMMOGRAM WITH 3D TOMO WITH CAD

[R CC]
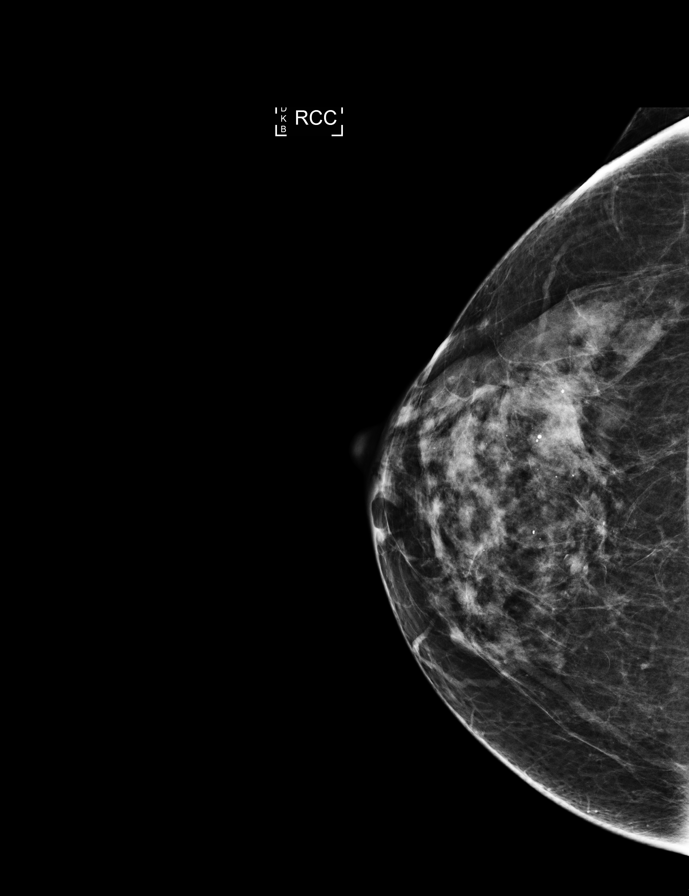

[L MLO]
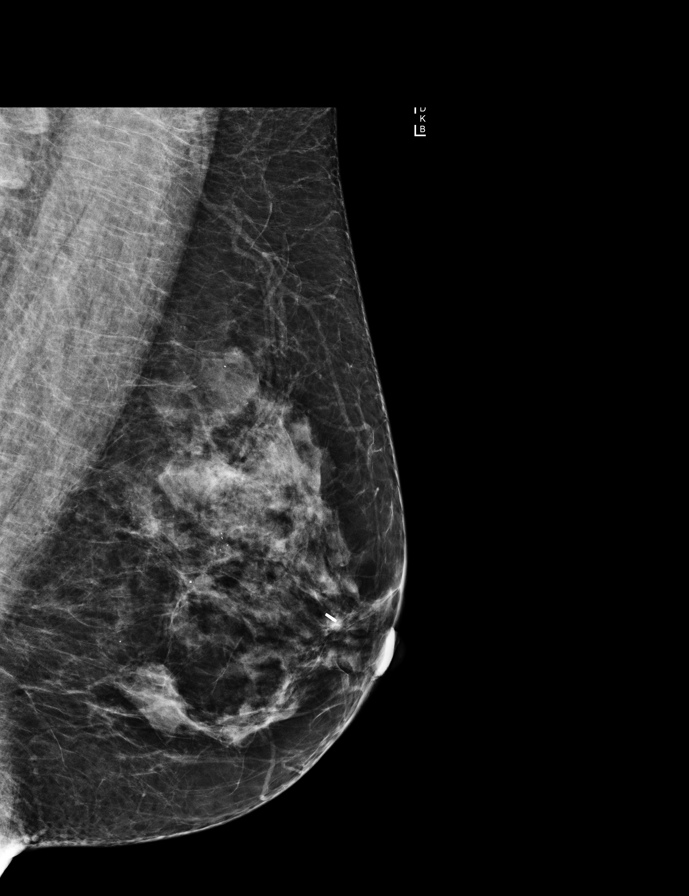

[R MLO]
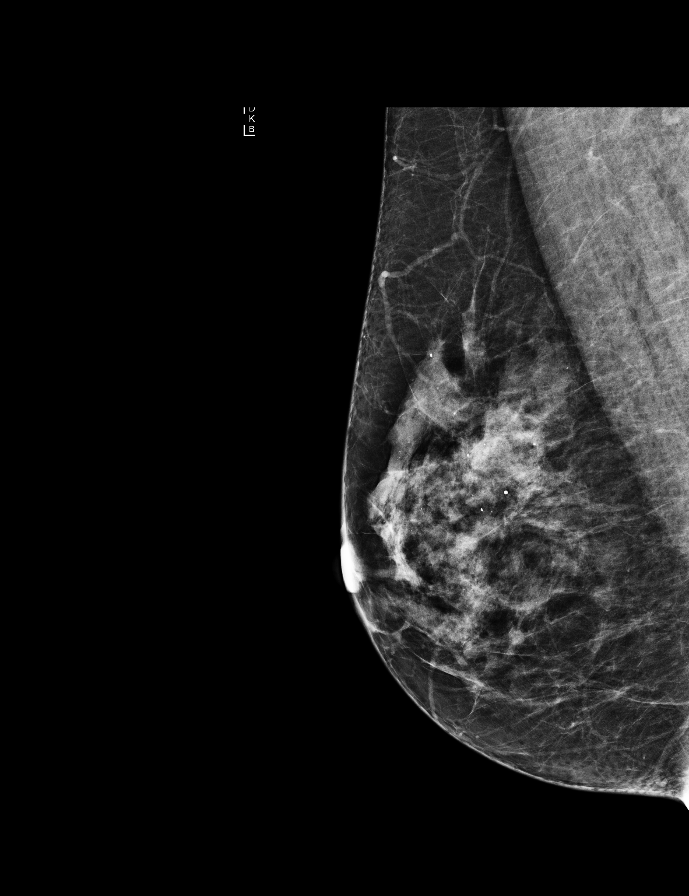

[L CC]
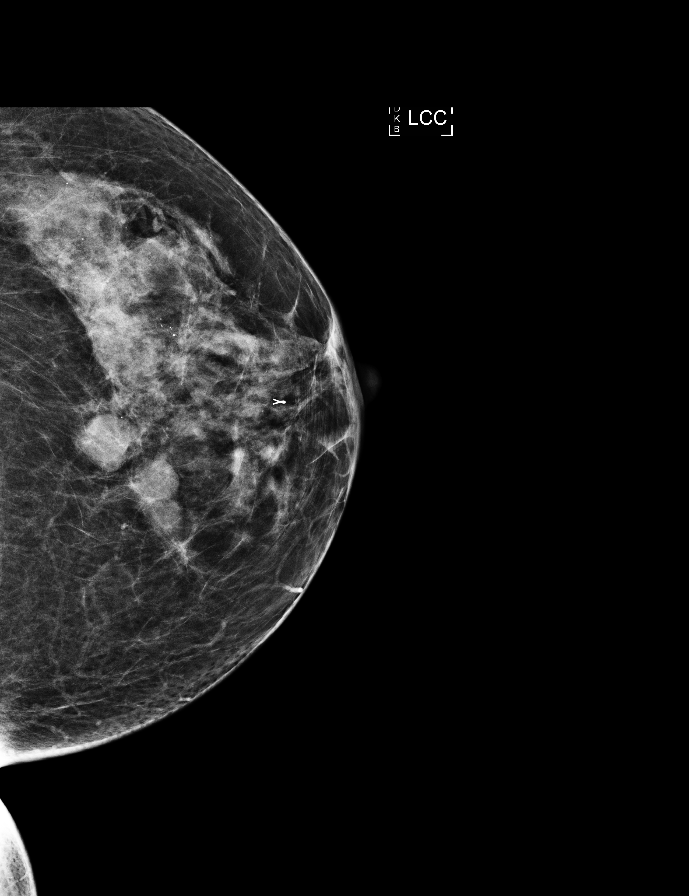

[L CC tomo · 2 of 50 frames shown]
[frame 17/50]
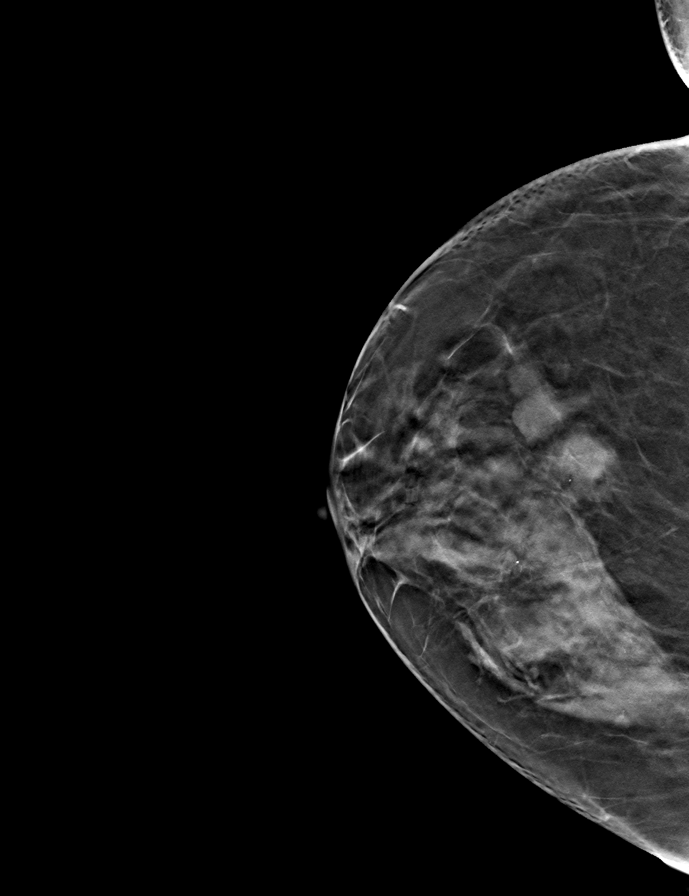
[frame 25/50]
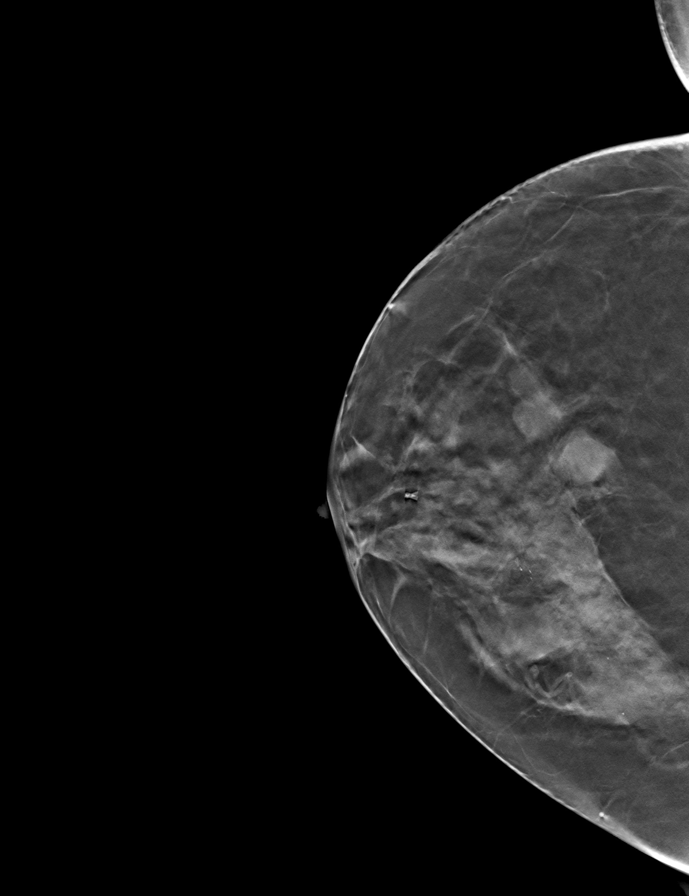

[R CC tomo · tomo slice 31/61.0]
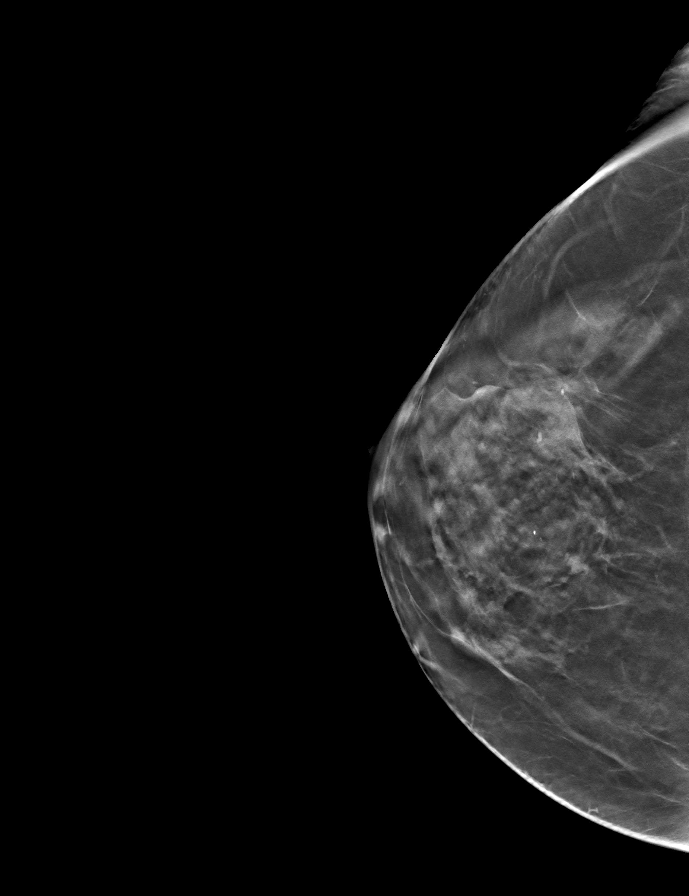

[R MLO tomo · tomo slice 29/57.0]
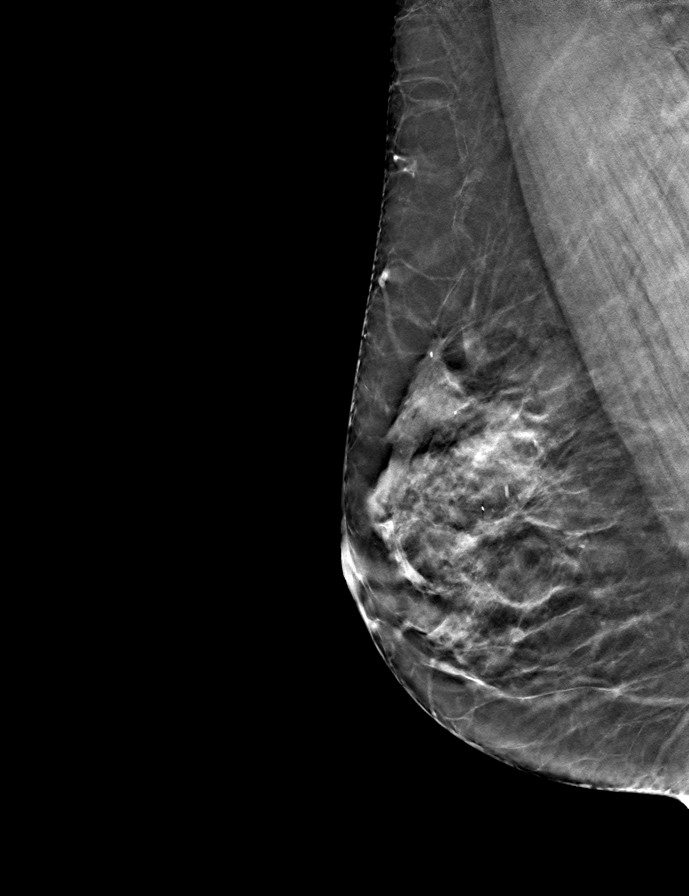

[L MLO tomo · tomo slice 32/63.0]
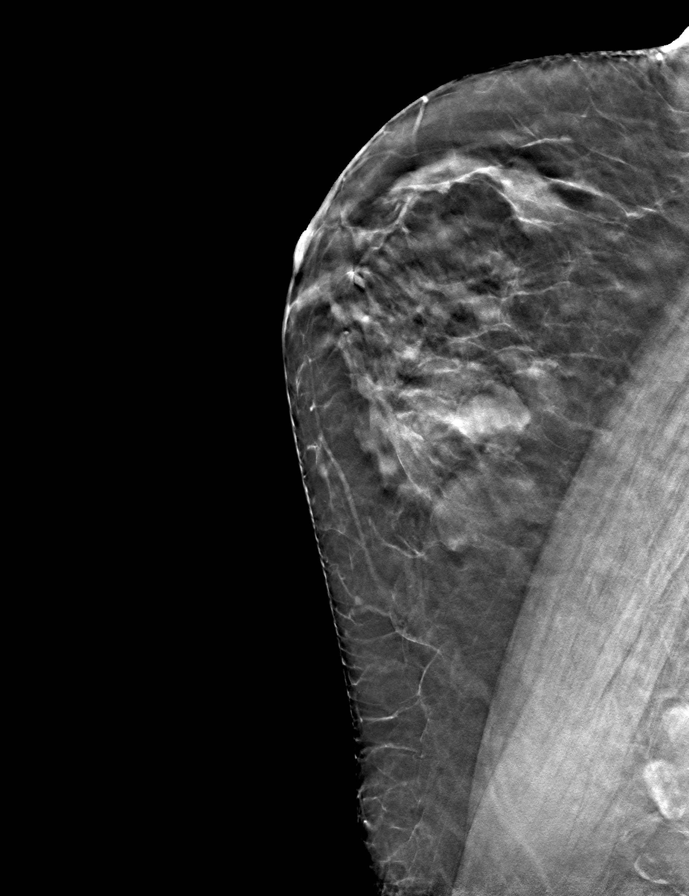

[9 of 24 positions shown; findings below may reference images not displayed]

ACR Breast Density Category c: The breast tissue is heterogeneously
dense, which may obscure small masses.
FINDINGS: In the left breast, a possible mass warrants further evaluation with
spot compression views and possibly ultrasound. In the right breast,
no findings suspicious for malignancy.

Images were processed with CAD.
IMPRESSION: Further evaluation is suggested for possible mass in the left
breast.

RECOMMENDATION:
Diagnostic mammogram and possibly ultrasound of the left breast.
(Code:[4I])

The patient will be contacted regarding the findings, and additional
imaging will be scheduled.

BI-RADS CATEGORY  0: Incomplete. Need additional imaging evaluation
and/or prior mammograms for comparison.

## 2014-05-14 ENCOUNTER — Ambulatory Visit
Admission: RE | Admit: 2014-05-14 | Discharge: 2014-05-14 | Disposition: A | Discharge status: Home / Self Care | Payer: BLUE CROSS/BLUE SHIELD | Source: Ambulatory Visit | Attending: Obstetrics and Gynecology | Admitting: Obstetrics and Gynecology

## 2014-05-14 DIAGNOSIS — R928 Other abnormal and inconclusive findings on diagnostic imaging of breast: Secondary | ICD-10-CM

## 2014-05-14 IMAGING — US US BREAST LTD UNI LEFT INC AXILLA
1 series · 13 of 14 positions shown · non-contrast
Comparison: [DATE] through [DATE]

CLINICAL DATA: Patient is an asymptomatic 53-year-old female who
was recalled from screening mammography to further evaluate the left
breast.

EXAM:
DIGITAL DIAGNOSTIC LEFT MAMMOGRAM
ULTRASOUND LEFT BREAST

[Series 1: advbreast · 13 of 14 slices shown]
[im 1/14]
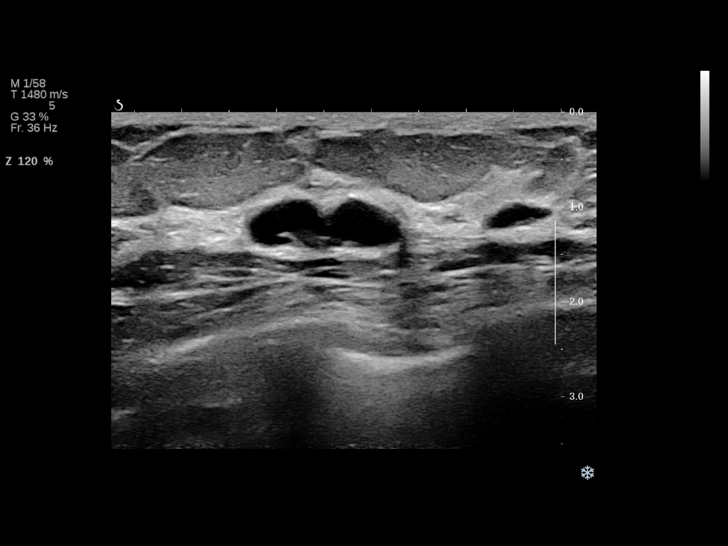
[im 2/14]
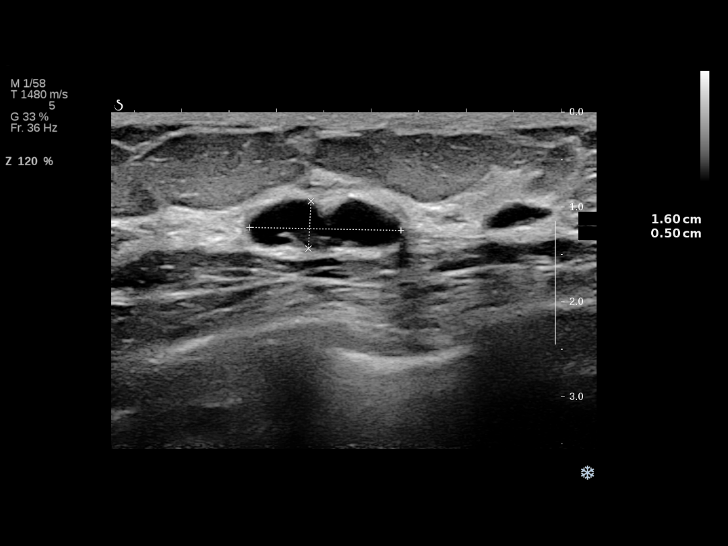
[im 3/14]
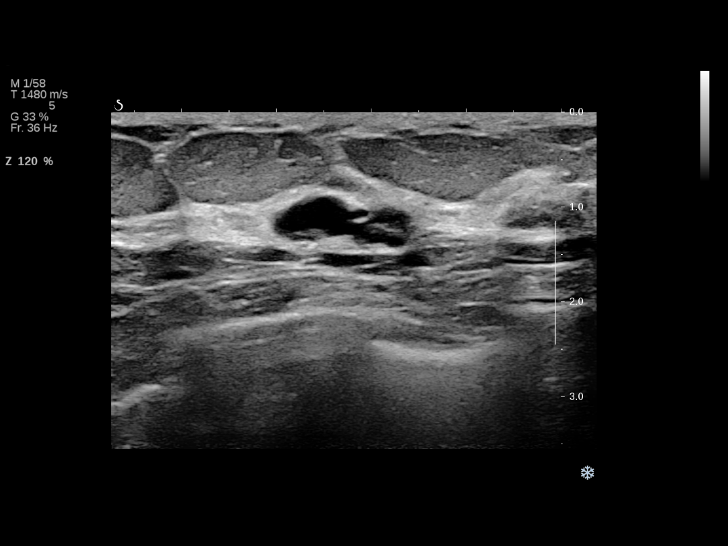
[im 4/14]
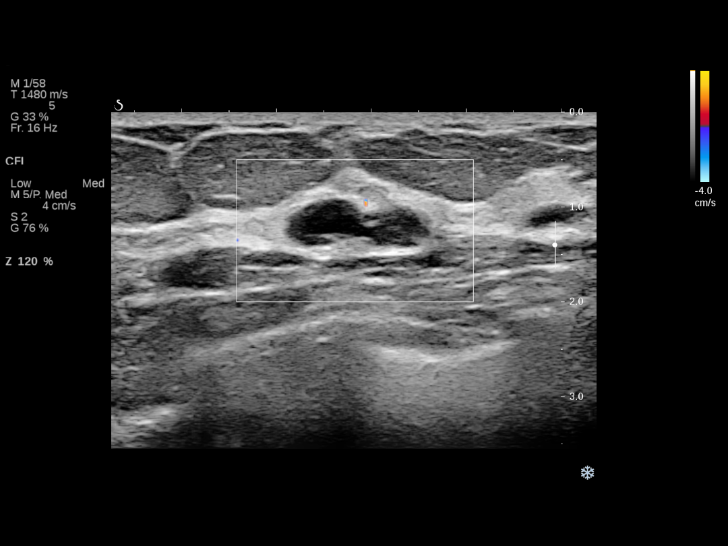
[im 5/14]
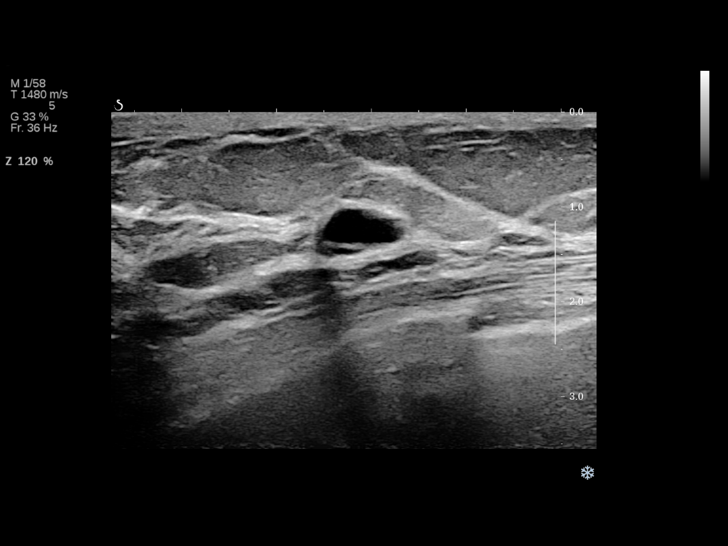
[im 6/14]
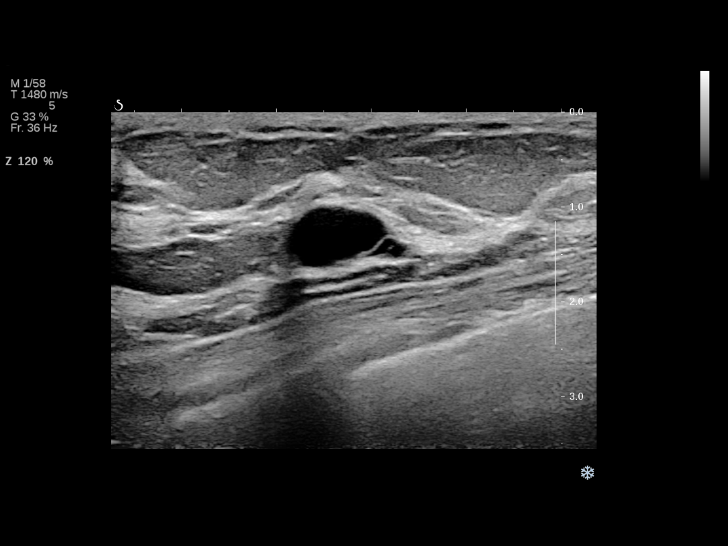
[im 8/14]
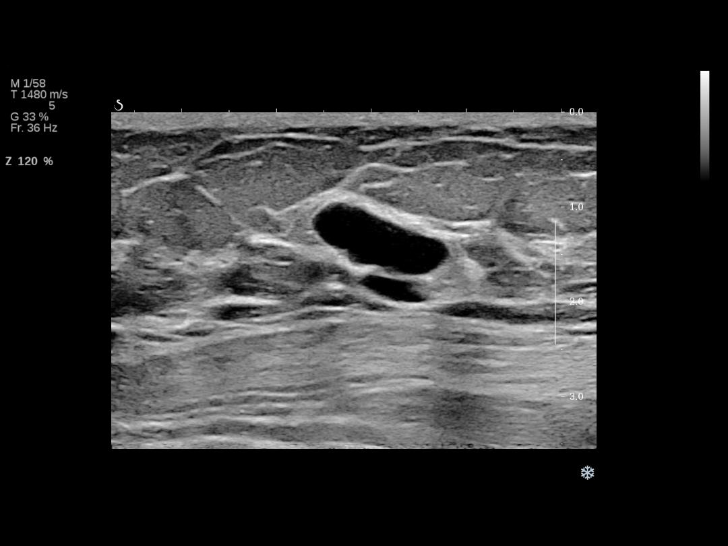
[im 9/14]
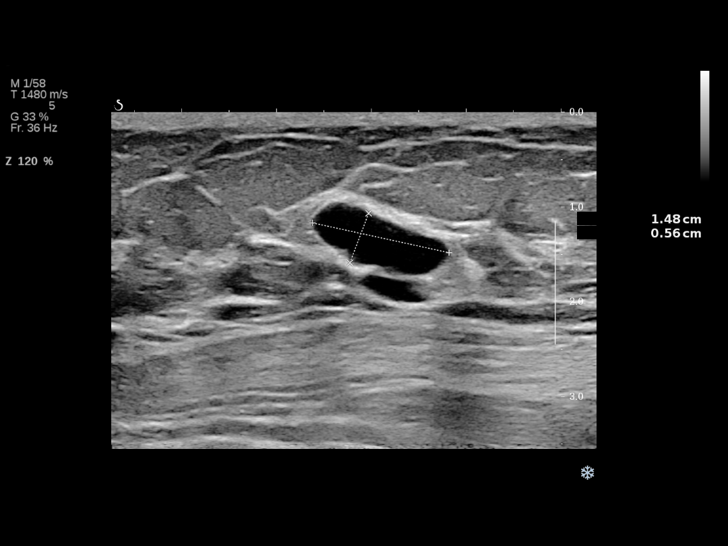
[im 10/14]
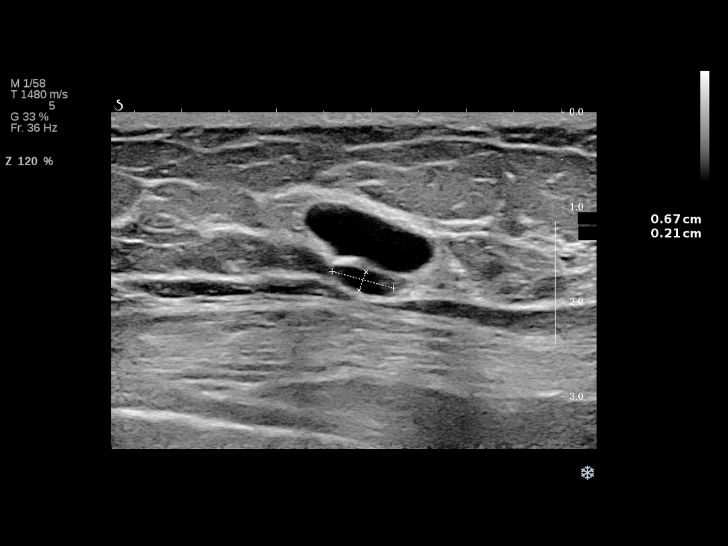
[im 11/14]
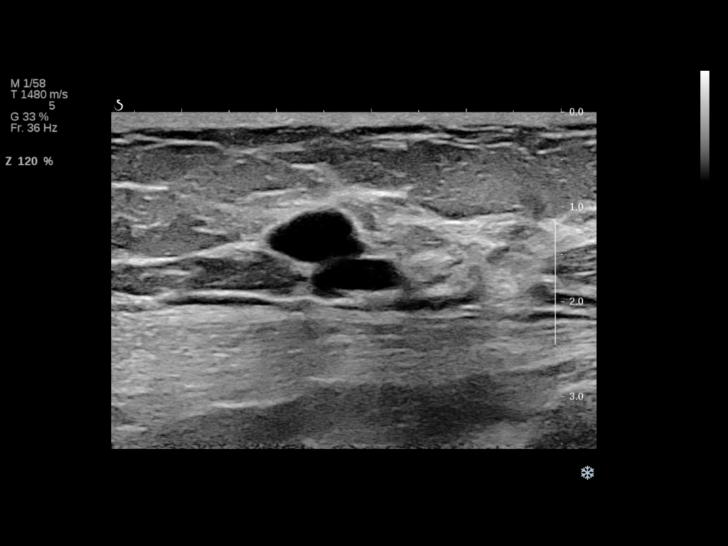
[im 12/14]
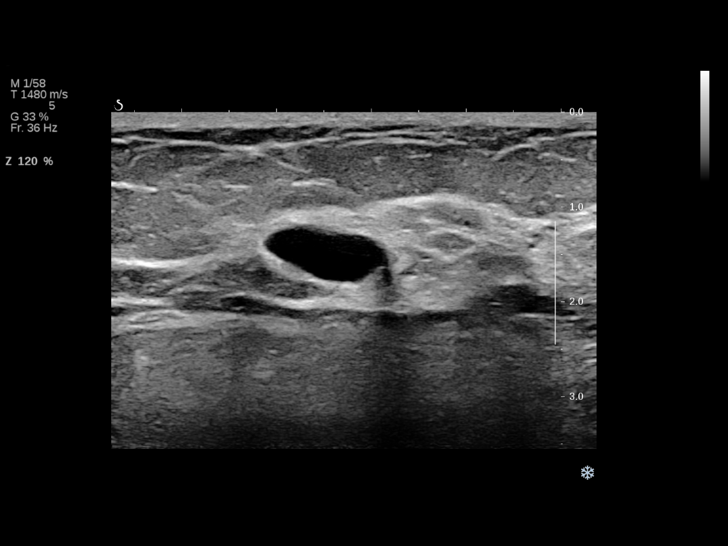
[im 13/14]
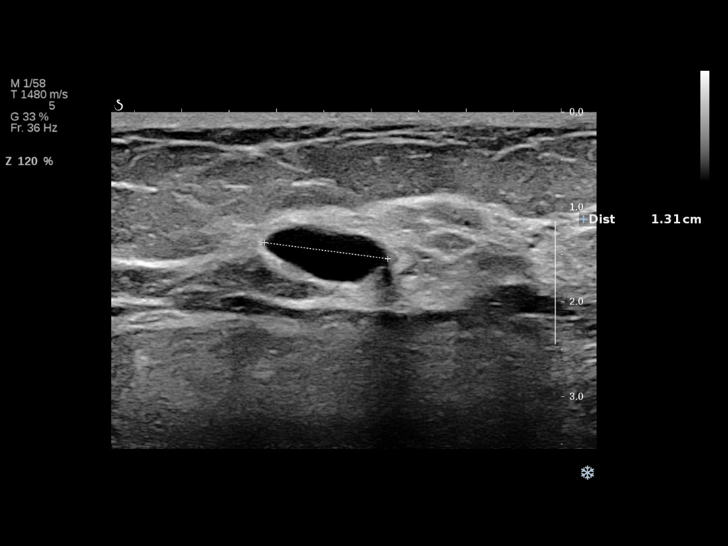
[im 14/14]
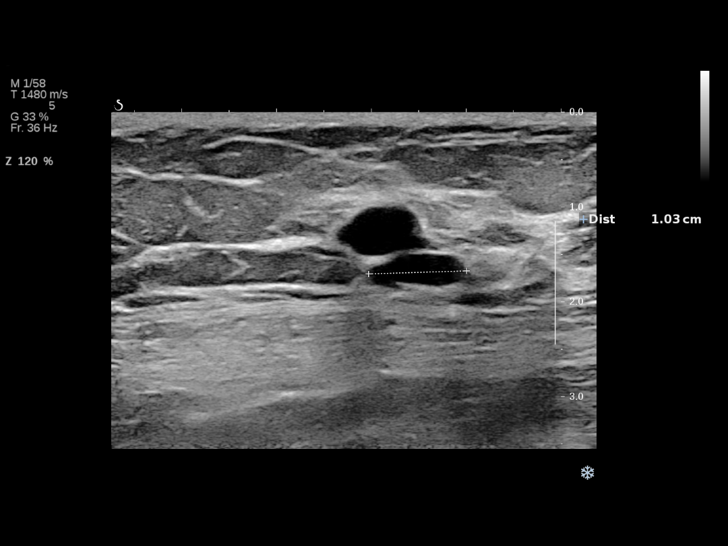

[13 of 14 positions shown; findings below may reference images not displayed]

ACR Breast Density Category c: The breast tissue is heterogeneously
dense, which may obscure small masses.
FINDINGS: Left unilateral digital diagnostic mammography demonstrates a
heterogeneously dense parenchymal pattern which may obscure
detection of small masses. Spot-compression views of the medial
breast reveal a persistent oval low-density mass with circumscribed
and obscured margins. No suspicious microcalcifications.

On physical exam, no palpable masses are noted.

Targeted ultrasound is performed. This demonstrates scattered benign
anechoic simple cysts and minimally complicated cysts of varying
size and real-time sonography. There is a bilobed minimally
complicated cyst with a single thin internal septation at 8 o'clock
1 cm from the nipple measuring approximately 13 x 16 x 5 mm in
maximal diameter. In addition, there is a pair of benign anechoic
simple cysts or a single complicated cyst with a internal septation
at 11 o'clock 4 cm from the nipple measuring approximately 13 x 15 x
8 mm. These benign cysts represent an excellent sonographic
correlation to the mammographic findings of note.
IMPRESSION: No mammographic or sonographic findings of malignancy in the left
breast.

RECOMMENDATION:
Screening mammogram in one year.(Code:[1C])

I have discussed the findings and recommendations with the patient.
Results were also provided in writing at the conclusion of the
visit. If applicable, a reminder letter will be sent to the patient
regarding the next appointment.

BI-RADS CATEGORY  2: Benign.

## 2014-05-14 IMAGING — MG MM DIAGNOSTIC UNILATERAL L
2 series · 2 of 2 positions shown · non-contrast
Comparison: [DATE] through [DATE]

CLINICAL DATA: Patient is an asymptomatic 53-year-old female who
was recalled from screening mammography to further evaluate the left
breast.

EXAM:
DIGITAL DIAGNOSTIC LEFT MAMMOGRAM
ULTRASOUND LEFT BREAST

[L MLO]
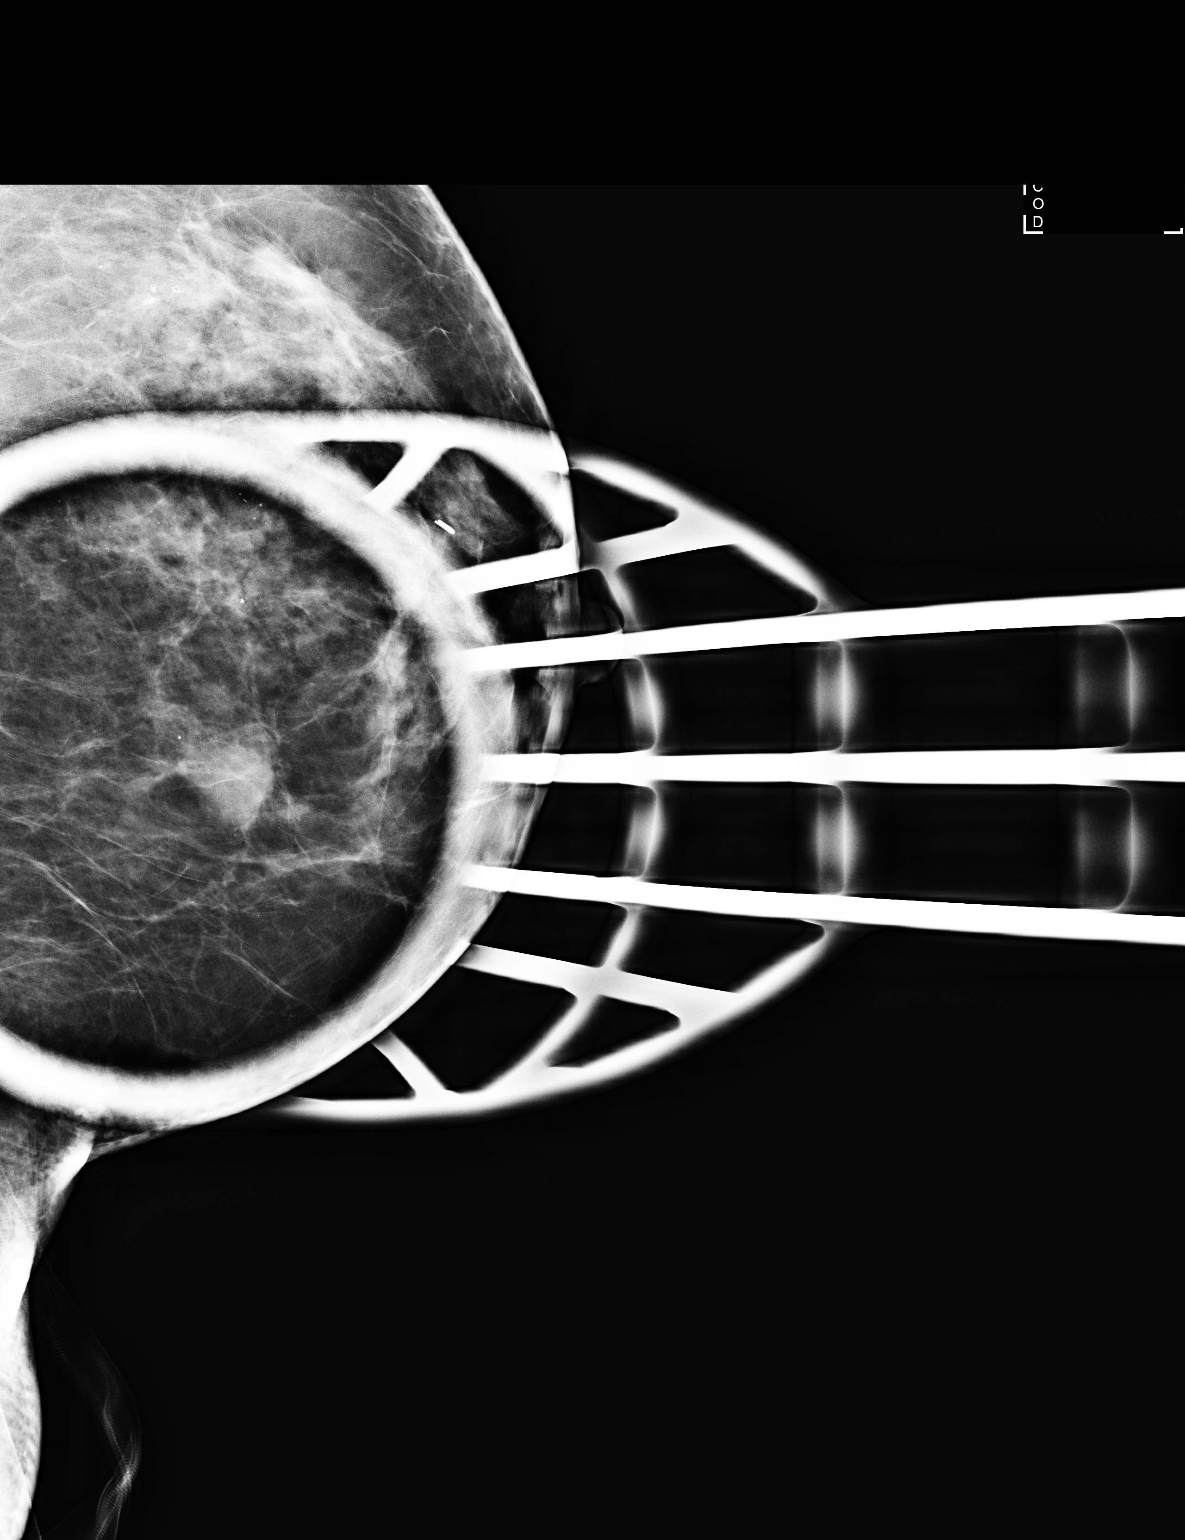

[L CC]
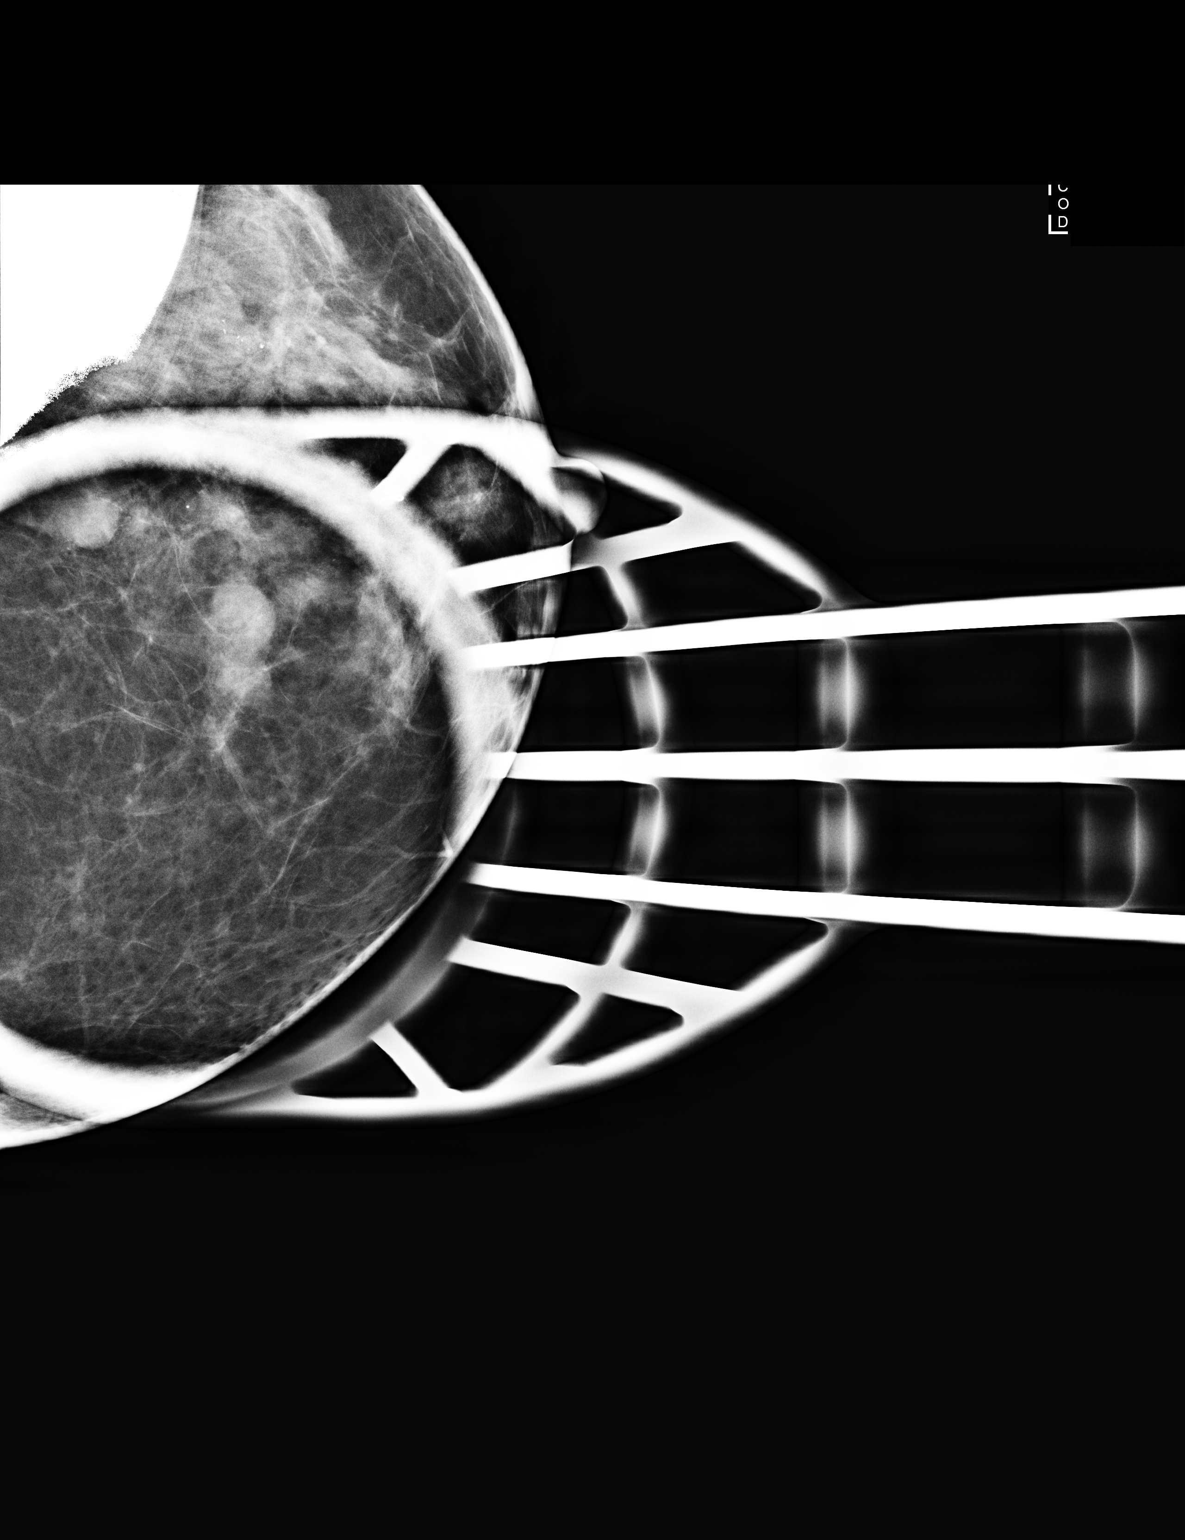

[2 of 2 positions shown; findings below may reference images not displayed]

ACR Breast Density Category c: The breast tissue is heterogeneously
dense, which may obscure small masses.
FINDINGS: Left unilateral digital diagnostic mammography demonstrates a
heterogeneously dense parenchymal pattern which may obscure
detection of small masses. Spot-compression views of the medial
breast reveal a persistent oval low-density mass with circumscribed
and obscured margins. No suspicious microcalcifications.

On physical exam, no palpable masses are noted.

Targeted ultrasound is performed. This demonstrates scattered benign
anechoic simple cysts and minimally complicated cysts of varying
size and real-time sonography. There is a bilobed minimally
complicated cyst with a single thin internal septation at 8 o'clock
1 cm from the nipple measuring approximately 13 x 16 x 5 mm in
maximal diameter. In addition, there is a pair of benign anechoic
simple cysts or a single complicated cyst with a internal septation
at 11 o'clock 4 cm from the nipple measuring approximately 13 x 15 x
8 mm. These benign cysts represent an excellent sonographic
correlation to the mammographic findings of note.
IMPRESSION: No mammographic or sonographic findings of malignancy in the left
breast.

RECOMMENDATION:
Screening mammogram in one year.(Code:[1C])

I have discussed the findings and recommendations with the patient.
Results were also provided in writing at the conclusion of the
visit. If applicable, a reminder letter will be sent to the patient
regarding the next appointment.

BI-RADS CATEGORY  2: Benign.

## 2014-05-25 ENCOUNTER — Other Ambulatory Visit: Payer: Self-pay | Admitting: Physician Assistant

## 2014-06-03 ENCOUNTER — Ambulatory Visit: Payer: BLUE CROSS/BLUE SHIELD | Admitting: Sports Medicine

## 2014-06-20 ENCOUNTER — Other Ambulatory Visit: Payer: Self-pay | Admitting: Sports Medicine

## 2014-07-18 ENCOUNTER — Other Ambulatory Visit: Payer: Self-pay | Admitting: Sports Medicine

## 2014-08-12 ENCOUNTER — Other Ambulatory Visit: Payer: Self-pay | Admitting: Sports Medicine

## 2014-08-14 ENCOUNTER — Other Ambulatory Visit: Payer: Self-pay | Admitting: Sports Medicine

## 2014-08-18 ENCOUNTER — Encounter: Payer: Self-pay | Admitting: Sports Medicine

## 2014-09-10 ENCOUNTER — Other Ambulatory Visit: Payer: Self-pay | Admitting: Sports Medicine

## 2014-10-06 ENCOUNTER — Encounter: Payer: Self-pay | Admitting: Family Medicine

## 2014-10-06 ENCOUNTER — Ambulatory Visit (INDEPENDENT_AMBULATORY_CARE_PROVIDER_SITE_OTHER): Payer: BLUE CROSS/BLUE SHIELD | Admitting: Family Medicine

## 2014-10-06 VITALS — BP 105/71 | HR 101 | Temp 98.2°F | Wt 167.0 lb

## 2014-10-06 DIAGNOSIS — J029 Acute pharyngitis, unspecified: Secondary | ICD-10-CM | POA: Diagnosis not present

## 2014-10-06 DIAGNOSIS — J069 Acute upper respiratory infection, unspecified: Secondary | ICD-10-CM | POA: Diagnosis not present

## 2014-10-06 LAB — POCT RAPID STREP A (OFFICE): Rapid Strep A Screen: NEGATIVE

## 2014-10-06 MED ORDER — HYDROCODONE-HOMATROPINE 5-1.5 MG/5ML PO SYRP: 5.0000 mL | ORAL_SOLUTION | Freq: Every evening | ORAL | Status: DC | PRN

## 2014-10-06 NOTE — Patient Instructions (Signed)
Upper Respiratory Infection, Adult An upper respiratory infection (URI) is also sometimes known as the common cold. The upper respiratory tract includes the nose, sinuses, throat, trachea, and bronchi. Bronchi are the airways leading to the lungs. Most people improve within 1 week, but symptoms can last up to 2 weeks. A residual cough may last even longer.  CAUSES Many different viruses can infect the tissues lining the upper respiratory tract. The tissues become irritated and inflamed and often become very moist. Mucus production is also common. A cold is contagious. You can easily spread the virus to others by oral contact. This includes kissing, sharing a glass, coughing, or sneezing. Touching your mouth or nose and then touching a surface, which is then touched by another person, can also spread the virus. SYMPTOMS  Symptoms typically develop 1 to 3 days after you come in contact with a cold virus. Symptoms vary from person to person. They may include:  Runny nose.  Sneezing.  Nasal congestion.  Sinus irritation.  Sore throat.  Loss of voice (laryngitis).  Cough.  Fatigue.  Muscle aches.  Loss of appetite.  Headache.  Low-grade fever. DIAGNOSIS  You might diagnose your own cold based on familiar symptoms, since most people get a cold 2 to 3 times a year. Your caregiver can confirm this based on your exam. Most importantly, your caregiver can check that your symptoms are not due to another disease such as strep throat, sinusitis, pneumonia, asthma, or epiglottitis. Blood tests, throat tests, and X-rays are not necessary to diagnose a common cold, but they may sometimes be helpful in excluding other more serious diseases. Your caregiver will decide if any further tests are required. RISKS AND COMPLICATIONS  You may be at risk for a more severe case of the common cold if you smoke cigarettes, have chronic heart disease (such as heart failure) or lung disease (such as asthma), or if  you have a weakened immune system. The very young and very old are also at risk for more serious infections. Bacterial sinusitis, middle ear infections, and bacterial pneumonia can complicate the common cold. The common cold can worsen asthma and chronic obstructive pulmonary disease (COPD). Sometimes, these complications can require emergency medical care and may be life-threatening. PREVENTION  The best way to protect against getting a cold is to practice good hygiene. Avoid oral or hand contact with people with cold symptoms. Wash your hands often if contact occurs. There is no clear evidence that vitamin C, vitamin E, echinacea, or exercise reduces the chance of developing a cold. However, it is always recommended to get plenty of rest and practice good nutrition. TREATMENT  Treatment is directed at relieving symptoms. There is no cure. Antibiotics are not effective, because the infection is caused by a virus, not by bacteria. Treatment may include:  Increased fluid intake. Sports drinks offer valuable electrolytes, sugars, and fluids.  Breathing heated mist or steam (vaporizer or shower).  Eating chicken soup or other clear broths, and maintaining good nutrition.  Getting plenty of rest.  Using gargles or lozenges for comfort.  Controlling fevers with ibuprofen or acetaminophen as directed by your caregiver.  Increasing usage of your inhaler if you have asthma. Zinc gel and zinc lozenges, taken in the first 24 hours of the common cold, can shorten the duration and lessen the severity of symptoms. Pain medicines may help with fever, muscle aches, and throat pain. A variety of non-prescription medicines are available to treat congestion and runny nose. Your caregiver   can make recommendations and may suggest nasal or lung inhalers for other symptoms.  HOME CARE INSTRUCTIONS   Only take over-the-counter or prescription medicines for pain, discomfort, or fever as directed by your  caregiver.  Use a warm mist humidifier or inhale steam from a shower to increase air moisture. This may keep secretions moist and make it easier to breathe.  Drink enough water and fluids to keep your urine clear or pale yellow.  Rest as needed.  Return to work when your temperature has returned to normal or as your caregiver advises. You may need to stay home longer to avoid infecting others. You can also use a face mask and careful hand washing to prevent spread of the virus. SEEK MEDICAL CARE IF:   After the first few days, you feel you are getting worse rather than better.  You need your caregiver's advice about medicines to control symptoms.  You develop chills, worsening shortness of breath, or brown or red sputum. These may be signs of pneumonia.  You develop yellow or brown nasal discharge or pain in the face, especially when you bend forward. These may be signs of sinusitis.  You develop a fever, swollen neck glands, pain with swallowing, or white areas in the back of your throat. These may be signs of strep throat. SEEK IMMEDIATE MEDICAL CARE IF:   You have a fever.  You develop severe or persistent headache, ear pain, sinus pain, or chest pain.  You develop wheezing, a prolonged cough, cough up blood, or have a change in your usual mucus (if you have chronic lung disease).  You develop sore muscles or a stiff neck. Document Released: 09/06/2000 Document Revised: 06/05/2011 Document Reviewed: 06/18/2013 [employer] Patient Information 2015 [employer]. This information is not intended to replace advice given to you by your health care provider. Make sure you discuss any questions you have with your health care provider.  

## 2014-10-06 NOTE — Progress Notes (Signed)
   Subjective:    Patient ID: Stephanie Chase, female    DOB: 09-Dec-1960, 54 y.o.   MRN: [id number]  HPI 2 days of chest heaviness, cough and ST.  [place]chey ears and sinuses are burning. She is using some mucinex.  No fever, chills.  Not using her allergy meds for the last month.    Review of Systems     Objective:   Physical Exam  Constitutional: She is oriented to person, place, and time. She appears well-developed and well-nourished.  HENT:  Head: Normocephalic and atraumatic.  Right Ear: External ear normal.  Left Ear: External ear normal.  Nose: Nose normal.  Mouth/Throat: Oropharynx is clear and moist.  TMs and canals are clear.   Eyes: Conjunctivae and EOM are normal. Pupils are equal, round, and reactive to light.  Neck: Neck supple. No thyromegaly present.  Cardiovascular: Normal rate, regular rhythm and normal heart sounds.   Pulmonary/Chest: Effort normal and breath sounds normal. She has no wheezes.  Lymphadenopathy:    She has no cervical adenopathy.  Neurological: She is alert and oriented to person, place, and time.  Skin: Skin is warm and dry.  Psychiatric: She has a normal mood and affect.          Assessment & Plan:  URI - likely viral. Symptomatic care. Call if not better in one week. Strep throat.

## 2014-10-08 ENCOUNTER — Telehealth: Payer: Self-pay | Admitting: *Deleted

## 2014-10-08 NOTE — Telephone Encounter (Signed)
Pt left vm stating that her URI sx have gotten worse since her visit on Tues.  She stated that she feels dizzy & lightheaded, & is now having a productive cough.

## 2014-10-09 NOTE — Telephone Encounter (Signed)
She could be developing some early bronchitis but we don't typically treat this with antibiotic. Again recommend symptomatic care. I'll be happy to provide a work note for her if needed. Otherwise she needs to give it a week and see if she starts to feel better on her home.

## 2014-10-10 ENCOUNTER — Encounter: Payer: Self-pay | Admitting: Emergency Medicine

## 2014-10-10 ENCOUNTER — Emergency Department
Admission: EM | Admit: 2014-10-10 | Discharge: 2014-10-10 | Disposition: A | Discharge status: Home / Self Care | Payer: BLUE CROSS/BLUE SHIELD | Source: Home / Self Care | Attending: Family Medicine | Admitting: Family Medicine

## 2014-10-10 DIAGNOSIS — J069 Acute upper respiratory infection, unspecified: Secondary | ICD-10-CM | POA: Diagnosis not present

## 2014-10-10 DIAGNOSIS — B9789 Other viral agents as the cause of diseases classified elsewhere: Principal | ICD-10-CM

## 2014-10-10 MED ORDER — DOXYCYCLINE HYCLATE 100 MG PO CAPS: 100.0000 mg | ORAL_CAPSULE | Freq: Two times a day (BID) | ORAL | Status: DC

## 2014-10-10 MED ORDER — PREDNISONE 20 MG PO TABS: 20.0000 mg | ORAL_TABLET | Freq: Two times a day (BID) | ORAL | Status: DC

## 2014-10-10 NOTE — ED Notes (Signed)
Patient has had nasal congestion, cough, sore throat, aches x 6-7 days; was seen by Dr.Metheney 10-06-14 and given rx for cough medication; has been taking Tylenol Cold/Flu OTC.

## 2014-10-10 NOTE — Discharge Instructions (Signed)
Take plain guaifenesin (1200mg  extended release tabs such as Mucinex) twice daily, with plenty of water, for cough and congestion.  May add Pseudoephedrine (30mg , one or two every 4 to 6 hours) for sinus congestion.  Get adequate rest.   May use Afrin nasal spray (or generic oxymetazoline) twice daily for about 5 days.  Also recommend using saline nasal spray several times daily and saline nasal irrigation (AYR is a common brand).  Use Flonase nasal spray each morning after using Afrin nasal spray and saline nasal irrigation. Try warm salt water gargles for sore throat.  May continue Hycodan syrup at bedtime for night cough Stop all antihistamines for now, and other non-prescription cough/cold preparations.   Follow-up with family doctor if not improving about10 days.

## 2014-10-10 NOTE — ED Provider Notes (Signed)
CSN: [id number]     Arrival date & time 10/10/14  1011 History   First MD Initiated Contact with Patient 10/10/14 1039     Chief Complaint  Patient presents with  . Nasal Congestion  . Generalized Body Aches  . Cough  . Sore Throat      HPI Comments: Patient complains of one week history of typical cold-like symptoms including sore throat, sinus congestion, headache, fatigue, and cough.  Her cough is productive, and worse at night.  She is a smoker, and has developed shortness of breath with activity this week.  No pleuritic pain.  No wheezing.  The history is provided by the patient.    Past Medical History  Diagnosis Date  . Lupus   . Thyroid disease    Past Surgical History  Procedure Laterality Date  . Breast lumpectomy    . Wisdom tooth extraction    . Tonsillectomy     Family History  Problem Relation Age of Onset  . Breast cancer Mother   . Diabetes Mother   . Heart attack Father   . Hyperlipidemia Father   . Hypertension Father   . Alzheimer's disease Father   . Alzheimer's disease     History  Substance Use Topics  . Smoking status: Current Every Day Smoker -- 1.00 packs/day    Types: Cigarettes  . Smokeless tobacco: Never Used  . Alcohol Use: Yes     Comment: once a month   OB History    Gravida Para Term Preterm AB TAB SAB Ectopic Multiple Living   2 2             Review of Systems + sore throat + cough No pleuritic pain No wheezing + nasal congestion + post-nasal drainage No sinus pain/pressure No itchy/red eyes No earache No hemoptysis + SOB with activity ? fever, + chills No nausea No vomiting No abdominal pain No diarrhea No urinary symptoms No skin rash + fatigue No myalgias + headache Used OTC meds without relief   Allergies  Review of patient's allergies indicates no known allergies.  Home Medications   Prior to Admission medications   Medication Sig Start Date End Date Taking? Authorizing Provider  doxycycline  (VIBRAMYCIN) 100 MG capsule Take 1 capsule (100 mg total) by mouth 2 (two) times daily. Take with food. 10/10/14   Kandra Nicolas, MD  fluticasone (FLONASE) 50 MCG/ACT nasal spray One spray in each nostril twice a day, use left hand for right nostril, and right hand for left nostril. 05/26/13   Silverio Decamp, MD  HYDROcodone-homatropine [facility]) 5-1.5 MG/5ML syrup Take 5 mLs by mouth at bedtime as needed for cough. 10/06/14   Hali Marry, MD  liothyronine (CYTOMEL) 5 MCG tablet TAKE 2 TABLETS (10 MCG TOTAL) BY MOUTH DAILY. 08/14/14   Silverio Decamp, MD  liothyronine (CYTOMEL) 5 MCG tablet TAKE 2 TABLETS (10 MCG TOTAL) BY MOUTH DAILY. 09/10/14   Silverio Decamp, MD  predniSONE (DELTASONE) 20 MG tablet Take 1 tablet (20 mg total) by mouth 2 (two) times daily. Take with food. 10/10/14   Kandra Nicolas, MD  SYNTHROID 75 MCG tablet TAKE 1 TABLET (75 MCG TOTAL) BY MOUTH DAILY BEFORE BREAKFAST. 08/12/14   Silverio Decamp, MD  terbinafine (LAMISIL) 250 MG tablet TAKE ONE TABLET BY MOUTH ONCE DAILY 04/23/14   Silverio Decamp, MD  Vitamin D, Ergocalciferol, (DRISDOL) 50000 UNITS CAPS capsule Take 1 capsule (50,000 Units total) by mouth every 7 (  seven) days. 03/30/14   Silverio Decamp, MD   BP 122/88 mmHg  Pulse 68  Temp(Src) 97.8 F (36.6 C) (Oral)  Resp 16  Ht 5' 5.5" (1.664 m)  Wt 165 lb (74.844 kg)  BMI 27.03 kg/m2  SpO2 96% Physical Exam Nursing notes and Vital Signs reviewed. Appearance:  Patient appears stated age, and in no acute distress Eyes:  Pupils are equal, round, and reactive to light and accomodation.  Extraocular movement is intact.  Conjunctivae are not inflamed  Ears:  Canals normal.  Tympanic membranes normal.  Nose:   Congested turbinates.  No sinus tenderness.   Pharynx:  Normal Neck:  Supple.   Tender enlarged posterior nodes are palpated bilaterally  Lungs:   Tubular breath sounds.  Breath sounds are equal.  Moving air well. Heart:   Regular rate and rhythm without murmurs, rubs, or gallops.  Abdomen:  Nontender without masses or hepatosplenomegaly.  Bowel sounds are present.  No CVA or flank tenderness.  Extremities:  No edema.  No calf tenderness Skin:  No rash present.   ED Course  Procedures  none  MDM   1. Viral URI with cough    Begin prednisone burst; patient's shortness of breath probably represents mild bronchospasm. Begin empiric doxycycline.  Although present illness probably viral, patient at risk for secondary infection (persistent smoker) Take plain guaifenesin (1200mg  extended release tabs such as Mucinex) twice daily, with plenty of water, for cough and congestion.  May add Pseudoephedrine (30mg , one or two every 4 to 6 hours) for sinus congestion.  Get adequate rest.   May use Afrin nasal spray (or generic oxymetazoline) twice daily for about 5 days.  Also recommend using saline nasal spray several times daily and saline nasal irrigation (AYR is a common brand).  Use Flonase nasal spray each morning after using Afrin nasal spray and saline nasal irrigation. Try warm salt water gargles for sore throat.  May continue Hycodan syrup at bedtime for night cough Stop all antihistamines for now, and other non-prescription cough/cold preparations.   Follow-up with family doctor if not improving about10 days.     Kandra Nicolas, MD 10/10/14 [phone]

## 2014-10-11 ENCOUNTER — Other Ambulatory Visit: Payer: Self-pay | Admitting: Sports Medicine

## 2014-10-13 NOTE — Telephone Encounter (Signed)
Pt was seen in UC over the weekend.

## 2014-12-12 ENCOUNTER — Other Ambulatory Visit: Payer: Self-pay | Admitting: Sports Medicine

## 2014-12-14 ENCOUNTER — Other Ambulatory Visit: Payer: Self-pay | Admitting: Sports Medicine

## 2015-03-03 ENCOUNTER — Ambulatory Visit (INDEPENDENT_AMBULATORY_CARE_PROVIDER_SITE_OTHER): Payer: BLUE CROSS/BLUE SHIELD

## 2015-03-03 ENCOUNTER — Encounter: Payer: Self-pay | Admitting: Sports Medicine

## 2015-03-03 ENCOUNTER — Ambulatory Visit (INDEPENDENT_AMBULATORY_CARE_PROVIDER_SITE_OTHER): Payer: BLUE CROSS/BLUE SHIELD | Admitting: Sports Medicine

## 2015-03-03 VITALS — BP 134/87 | HR 78 | Wt 171.0 lb

## 2015-03-03 DIAGNOSIS — J709 Respiratory conditions due to unspecified external agent: Secondary | ICD-10-CM

## 2015-03-03 DIAGNOSIS — E039 Hypothyroidism, unspecified: Secondary | ICD-10-CM | POA: Diagnosis not present

## 2015-03-03 DIAGNOSIS — E785 Hyperlipidemia, unspecified: Secondary | ICD-10-CM

## 2015-03-03 DIAGNOSIS — J439 Emphysema, unspecified: Secondary | ICD-10-CM

## 2015-03-03 HISTORY — DX: Respiratory conditions due to unspecified external agent: J70.9

## 2015-03-03 IMAGING — CR DG CHEST 2V
2 series · 2 of 2 positions shown · non-contrast
Comparison: [DATE]

CLINICAL DATA: Chest tightness with shortness of breath and cough 1
week.

EXAM:
CHEST  2 VIEW

[chest pa]
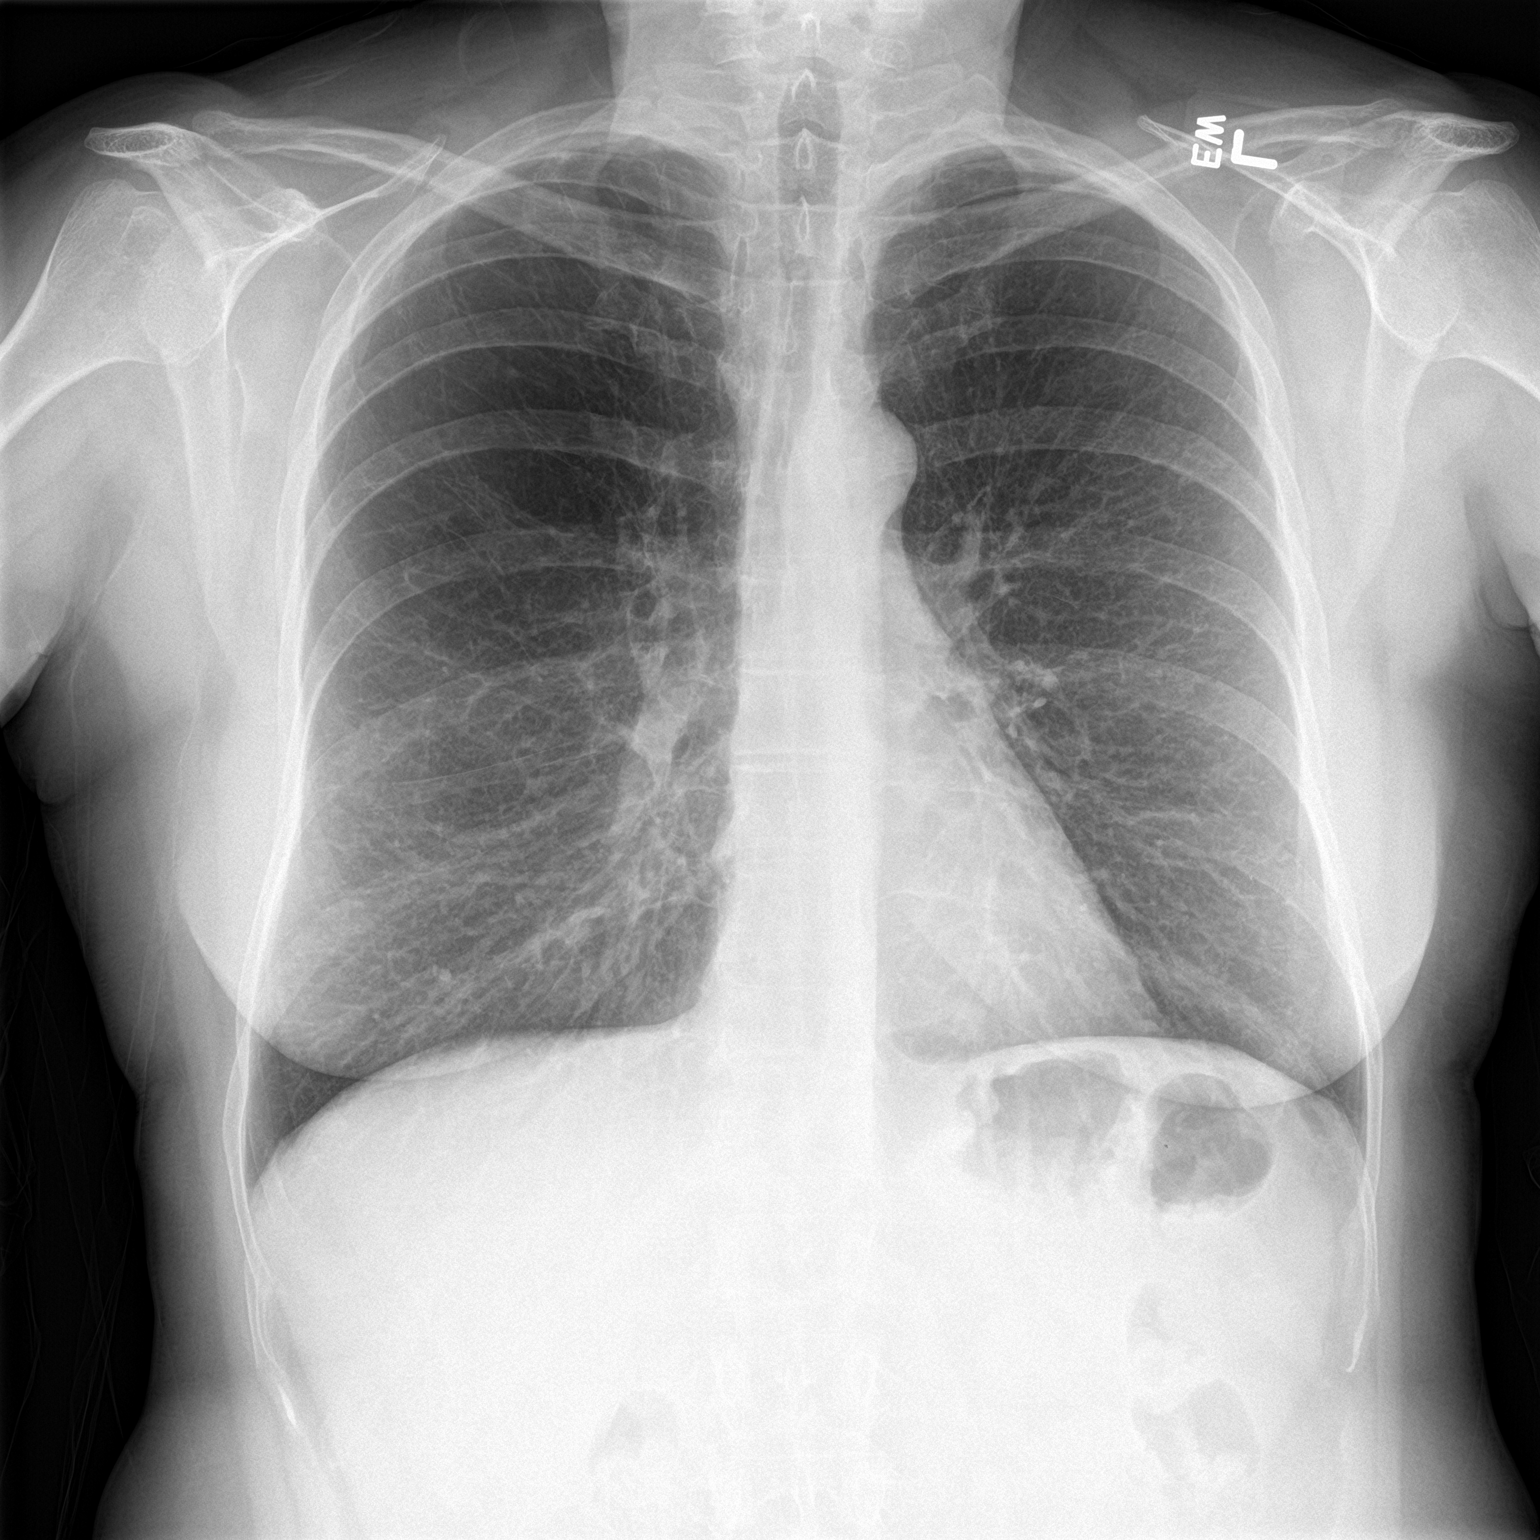

[chest lat]
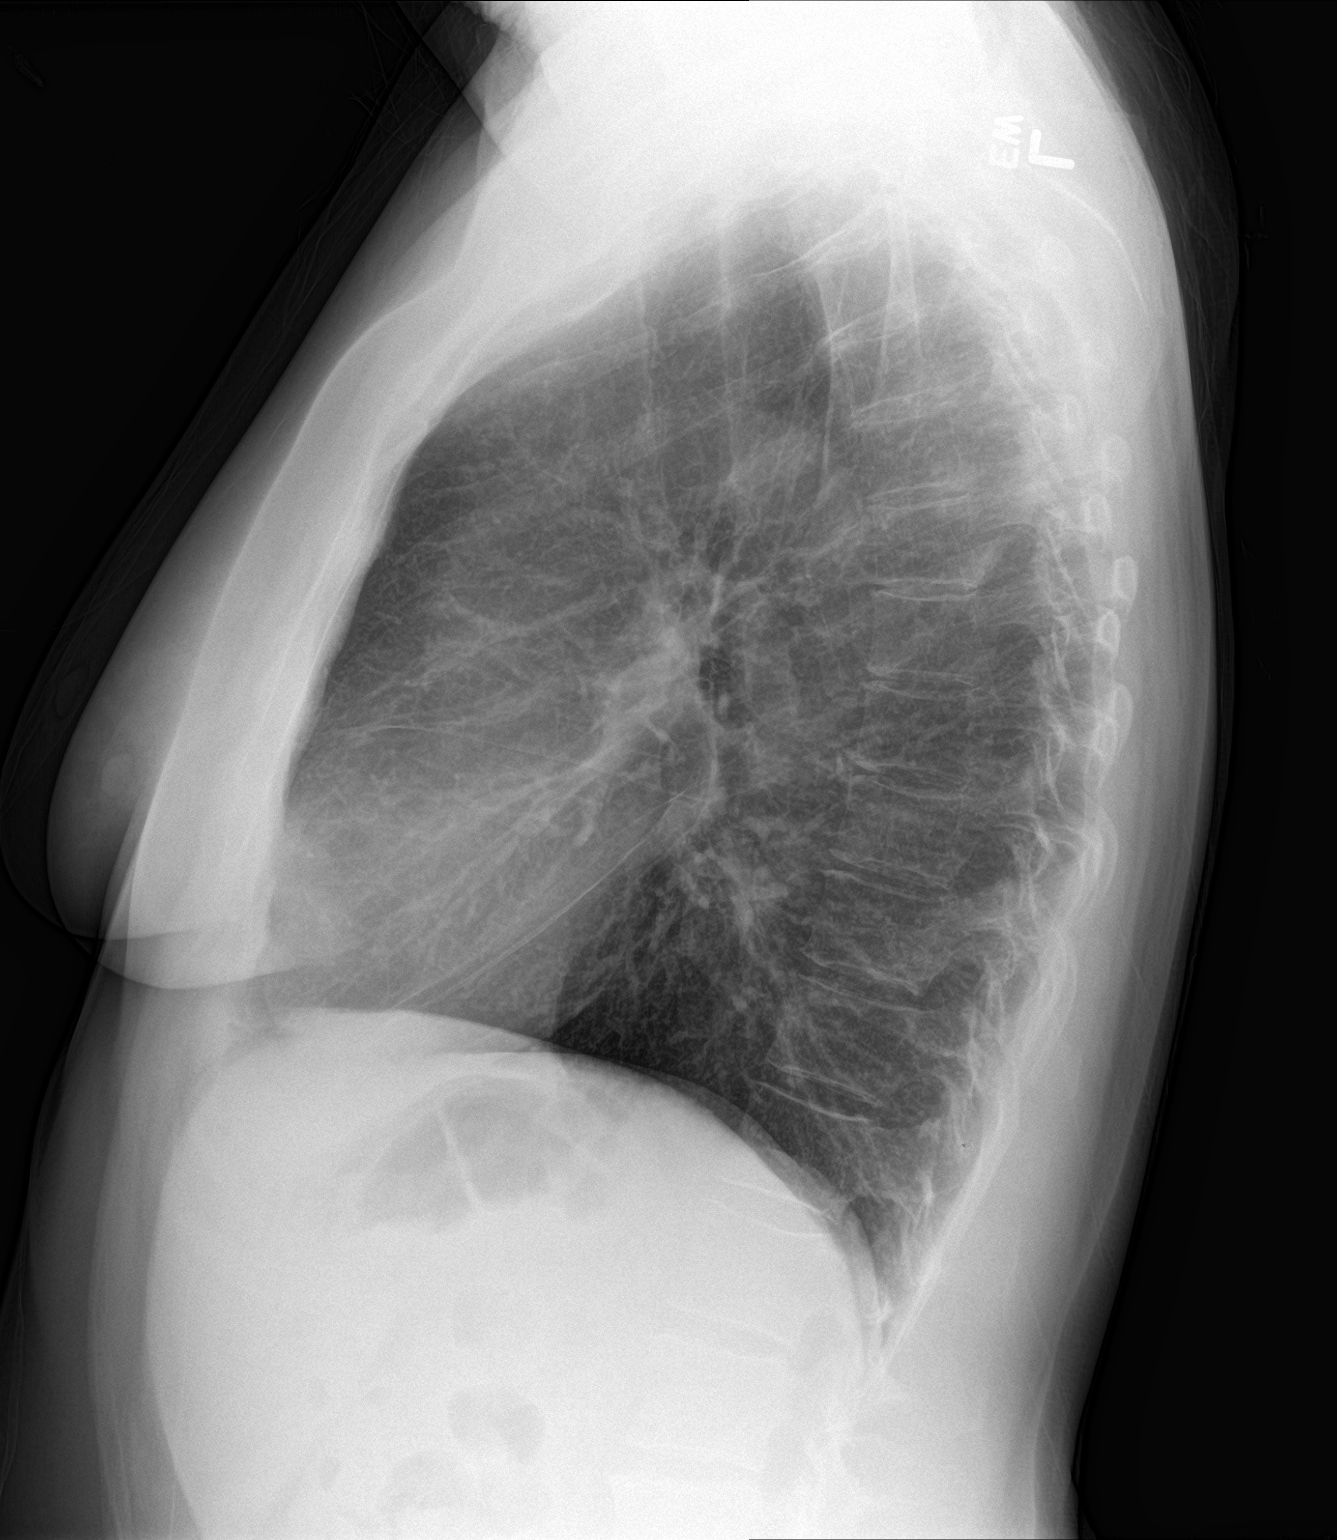

[2 of 2 positions shown; findings below may reference images not displayed]

FINDINGS: Lungs are adequately inflated without consolidation or effusion.
Mild emphysematous disease over the upper lungs. Cardiomediastinal
silhouette is within normal. There is minimal degenerative change of
the spine.
IMPRESSION: No acute cardiopulmonary disease.

Mild emphysematous disease.

## 2015-03-03 MED ORDER — PREDNISONE 50 MG PO TABS: 50.0000 mg | ORAL_TABLET | Freq: Every day | ORAL | Status: DC

## 2015-03-03 MED ORDER — MONTELUKAST SODIUM 10 MG PO TABS: 10.0000 mg | ORAL_TABLET | Freq: Every day | ORAL | Status: DC

## 2015-03-03 MED ORDER — BENZONATATE 200 MG PO CAPS: 200.0000 mg | ORAL_CAPSULE | Freq: Three times a day (TID) | ORAL | Status: DC | PRN

## 2015-03-03 MED ORDER — ALBUTEROL SULFATE 108 (90 BASE) MCG/ACT IN AEPB: 2.0000 | INHALATION_SPRAY | Freq: Four times a day (QID) | RESPIRATORY_TRACT | Status: DC | PRN

## 2015-03-03 NOTE — Assessment & Plan Note (Signed)
Checking TSH.

## 2015-03-03 NOTE — Assessment & Plan Note (Signed)
Prednisone, Tessalon Perles, Singulair, chest x-ray. Return in 2 weeks. Refilling albuterol.

## 2015-03-03 NOTE — Assessment & Plan Note (Signed)
Due for blood work

## 2015-03-03 NOTE — Progress Notes (Signed)
  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 For the past several days she's had increasing cough, nonproductive, no constitutional symptoms, nasal congestion. No chest pain, shortness of breath. No constitutional symptoms. She does have a history of asthma and needs a refill on her albuterol.  Hyperlipidemia: Stable on atorvastatin, do for a recheck.  Hypothyroidism: Stable on levothyroxine and Cytomel, due for a recheck.  Multiple joint aches: Advised to discuss this with me at another visit.  Past medical history, Surgical history, Family history not pertinant except as noted below, Social history, Allergies, and medications have been entered into the medical record, reviewed, and no changes needed.   Review of Systems: No fevers, chills, night sweats, weight loss, chest pain, or shortness of breath.   Objective:    General: Well Developed, well nourished, and in no acute distress.  Neuro: Alert and oriented x3, extra-ocular muscles intact, sensation grossly intact.  HEENT: Normocephalic, atraumatic, pupils equal round reactive to light, neck supple, no masses, no lymphadenopathy, thyroid nonpalpable. Oropharynx, nasopharynx, ear canals are unremarkable.  Skin: Warm and dry, no rashes. Cardiac: Regular rate and rhythm, no murmurs rubs or gallops, no lower extremity edema.  Respiratory: Clear to auscultation bilaterally. Not using accessory muscles, speaking in full sentences.  Impression and Recommendations:   I spent 25 minutes with this patient, greater than 50% was face-to-face time counseling regarding the above diagnoses

## 2015-03-09 ENCOUNTER — Other Ambulatory Visit: Payer: Self-pay | Admitting: Sports Medicine

## 2015-03-25 ENCOUNTER — Telehealth: Payer: Self-pay

## 2015-03-25 NOTE — Telephone Encounter (Signed)
With persistent symptoms it's time to get her set up for pre-and postbronchodilator spirometry.  Ultimately we may need to start an inhaler.

## 2015-03-25 NOTE — Telephone Encounter (Signed)
Pt states she's still not feeling better. Would like to know what she can do from this point. Please advise.

## 2015-03-26 NOTE — Telephone Encounter (Signed)
Informed pt, transferred to scheduling for an appointment.

## 2015-08-17 ENCOUNTER — Ambulatory Visit: Payer: BLUE CROSS/BLUE SHIELD | Admitting: Sports Medicine

## 2015-08-21 ENCOUNTER — Other Ambulatory Visit: Payer: Self-pay | Admitting: Sports Medicine

## 2015-10-21 ENCOUNTER — Ambulatory Visit (INDEPENDENT_AMBULATORY_CARE_PROVIDER_SITE_OTHER): Payer: BLUE CROSS/BLUE SHIELD | Admitting: Sports Medicine

## 2015-10-21 ENCOUNTER — Encounter: Payer: Self-pay | Admitting: Sports Medicine

## 2015-10-21 DIAGNOSIS — M7552 Bursitis of left shoulder: Secondary | ICD-10-CM | POA: Diagnosis not present

## 2015-10-21 NOTE — Progress Notes (Signed)
  Subjective:    CC: Left shoulder pain  HPI: This is a pleasant 55 year old female with a several week history of pain of the deltoid, worse with overhead activities, moderate, persistent with radiation down to above the elbow. Moderate, persistent.  Past medical history, Surgical history, Family history not pertinant except as noted below, Social history, Allergies, and medications have been entered into the medical record, reviewed, and no changes needed.   Review of Systems: No fevers, chills, night sweats, weight loss, chest pain, or shortness of breath.   Objective:    General: Well Developed, well nourished, and in no acute distress.  Neuro: Alert and oriented x3, extra-ocular muscles intact, sensation grossly intact.  HEENT: Normocephalic, atraumatic, pupils equal round reactive to light, neck supple, no masses, no lymphadenopathy, thyroid nonpalpable.  Skin: Warm and dry, no rashes. Cardiac: Regular rate and rhythm, no murmurs rubs or gallops, no lower extremity edema.  Respiratory: Clear to auscultation bilaterally. Not using accessory muscles, speaking in full sentences. Left Shoulder: Inspection reveals no abnormalities, atrophy or asymmetry. Palpation is normal with no tenderness over AC joint or bicipital groove. ROM is full in all planes. Rotator cuff strength normal throughout. Positive Neer and Hawkin's tests, empty can. Speeds and Yergason's tests normal. No labral pathology noted with negative Obrien's, negative crank, negative clunk, and good stability. Normal scapular function observed. No painful arc and no drop arm sign. No apprehension sign  Procedure: Real-time Ultrasound Guided Injection of left subacromial bursa Device: GE Logiq E  Verbal informed consent obtained.  Time-out conducted.  Noted no overlying erythema, induration, or other signs of local infection.  Skin prepped in a sterile fashion.  Local anesthesia: Topical Ethyl chloride.  With sterile  technique and under real time ultrasound guidance:  1 mL kenalog 40, 1 mL lidocaine, 1 mL Marcaine injected easily. Completed without difficulty  Pain immediately resolved suggesting accurate placement of the medication.  Advised to call if fevers/chills, erythema, induration, drainage, or persistent bleeding.  Images permanently stored and available for review in the ultrasound unit.  Impression: Technically successful ultrasound guided injection.  Impression and Recommendations:    I spent 25 minutes with this patient, greater than 50% was face-to-face time counseling regarding the above diagnoses, this time was separate from the time spent performing the procedure

## 2015-10-21 NOTE — Assessment & Plan Note (Signed)
Classic impingement type symptoms. Subacromial injection, physical therapy, return to see me in 4 weeks, MRI if no better.

## 2015-11-04 ENCOUNTER — Encounter: Payer: Self-pay | Admitting: Physical Therapy

## 2015-11-04 ENCOUNTER — Ambulatory Visit (INDEPENDENT_AMBULATORY_CARE_PROVIDER_SITE_OTHER): Payer: BLUE CROSS/BLUE SHIELD | Admitting: Physical Therapy

## 2015-11-04 DIAGNOSIS — M6281 Muscle weakness (generalized): Secondary | ICD-10-CM | POA: Diagnosis not present

## 2015-11-04 DIAGNOSIS — R293 Abnormal posture: Secondary | ICD-10-CM

## 2015-11-04 DIAGNOSIS — M25512 Pain in left shoulder: Secondary | ICD-10-CM
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
## 2015-11-04 NOTE — Patient Instructions (Signed)
Trigger Point Dry Needling  . What is Trigger Point Dry Needling (DN)? o DN is a physical therapy technique used to treat muscle pain and dysfunction. Specifically, DN helps deactivate muscle trigger points (muscle knots).  o A thin filiform needle is used to penetrate the skin and stimulate the underlying trigger point. The goal is for a local twitch response (LTR) to occur and for the trigger point to relax. No medication of any kind is injected during the procedure.   . What Does Trigger Point Dry Needling Feel Like?  o The procedure feels different for each individual patient. Some patients report that they do not actually feel the needle enter the skin and overall the process is not painful. Very mild bleeding may occur. However, many patients feel a deep cramping in the muscle in which the needle was inserted. This is the local twitch response.   Marland Kitchen How Will I feel after the treatment? o Soreness is normal, and the onset of soreness may not occur for a few hours. Typically this soreness does not last longer than two days.  o Bruising is uncommon, however; ice can be used to decrease any possible bruising.  o In rare cases feeling tired or nauseous after the treatment is normal. In addition, your symptoms may get worse before they get better, this period will typically not last longer than 24 hours.   . What Can I do After My Treatment? o Increase your hydration by drinking more water for the next 24 hours. o You may place ice or heat on the areas treated that have become sore, however, do not use heat on inflamed or bruised areas. Heat often brings more relief post needling. o You can continue your regular activities, but vigorous activity is not recommended initially after the treatment for 24 hours. o DN is best combined with other physical therapy such as strengthening, stretching, and other therapies.     Over Head Pull: Narrow Grip     [place] [phone]   On back, knees bent, feet  flat, band across thighs, elbows straight but relaxed. Pull hands apart (start). Keeping elbows straight, bring arms up and over head, hands toward floor. Keep pull steady on band. Hold momentarily. Return slowly, keeping pull steady, back to start. Repeat _2-3x10__ times. Band color __yellow____   Side Pull: Double Arm   On back, knees bent, feet flat. Arms perpendicular to body, shoulder level, elbows straight but relaxed. Pull arms out to sides, elbows straight. Resistance band comes across collarbones, hands toward floor. Hold momentarily. Slowly return to starting position. Repeat _2-3x10__ times. Band color __yellow___   Shoulder Rotation: Double Arm   On back, knees bent, feet flat, elbows tucked at sides, bent 90, hands palms up. Pull hands apart and down toward floor, keeping elbows near sides. Hold momentarily. Slowly return to starting position. Repeat _2-3x10__ times. Band color __yellow____   Scapula Adduction With Pectorals, Low   Stand in doorframe with palms against frame and arms at 45. Lean forward and squeeze shoulder blades. Hold 30-45___ seconds. Repeat __1-2_ times per session. Do 1___ sessions per day.  Scapula Adduction With Pectorals, Mid-Range   Stand in doorframe with palms against frame and arms at 90. Lean forward and squeeze shoulder blades. Hold 30-45___ seconds. Repeat _1-2__ times per session. Do _1__ sessions per day.

## 2015-11-16 ENCOUNTER — Encounter: Payer: BLUE CROSS/BLUE SHIELD | Admitting: Physical Therapy

## 2015-11-18 ENCOUNTER — Encounter: Payer: BLUE CROSS/BLUE SHIELD | Admitting: Physical Therapy

## 2015-11-22 ENCOUNTER — Encounter: Payer: BLUE CROSS/BLUE SHIELD | Admitting: Physical Therapy

## 2015-11-24 ENCOUNTER — Ambulatory Visit (INDEPENDENT_AMBULATORY_CARE_PROVIDER_SITE_OTHER): Payer: BLUE CROSS/BLUE SHIELD | Admitting: Physical Therapy

## 2015-11-24 DIAGNOSIS — M6281 Muscle weakness (generalized): Secondary | ICD-10-CM | POA: Diagnosis not present

## 2015-11-24 DIAGNOSIS — R293 Abnormal posture: Secondary | ICD-10-CM

## 2015-11-24 DIAGNOSIS — M25512 Pain in left shoulder: Secondary | ICD-10-CM

## 2015-11-24 NOTE — Therapy (Signed)
[place] [place], [place], [address] Phone: [phone]   Fax:  [phone]  Physical Therapy Treatment  Patient Details  Name: Stephanie Chase MRN: [id number] Date of Birth: 01-Mar-1961 Referring Provider: Dr Dianah Field  Encounter Date: 11/24/2015      PT End of Session - 11/24/15 0720    Visit Number 2   Number of Visits 6   Date for PT Re-Evaluation 12/02/15   PT Start Time 0720   PT Stop Time 0811   PT Time Calculation (min) 51 min   Activity Tolerance Patient tolerated treatment well      Past Medical History:  Diagnosis Date  . Lupus ([place])   . Thyroid disease     Past Surgical History:  Procedure Laterality Date  . BREAST LUMPECTOMY    . TONSILLECTOMY    . WISDOM TOOTH EXTRACTION      There were no vitals filed for this visit.      Subjective Assessment - 11/24/15 0721    Subjective Pt states she hasn't been able to do her HEP as much as she is supposed to, only a couple times a week.  She has had a lot things going on. Something happened yesterday, on the way back from [place] driving had a lot of pain.    Patient Stated Goals get rid of pain and do her activites   Currently in Pain? Yes   Pain Score 3   yesterday 9/10   Pain Orientation Left   Pain Descriptors / Indicators Sharp   Pain Type Acute pain   Pain Onset More than a month ago   Aggravating Factors  sleeping on Lt and reaching over head   Pain Relieving Factors hanging arm, resting.             [facility] PT Assessment - 11/24/15 0001      Assessment   Medical Diagnosis Lt shoulder bursitis                     [facility] Adult PT Treatment/Exercise - 11/24/15 0001      Shoulder Exercises: Sidelying   External Rotation Strengthening;Left;Weights  3x10 with towel under elbow   External Rotation Weight (lbs) 2   Other Sidelying Exercises empty can 3x10 in small ROM, 2#      Shoulder Exercises:  ROM/Strengthening   UBE (Upper Arm Bike) L2 x 4' alt FWD/BWD     Modalities   Modalities Electrical Stimulation;Moist Heat     Moist Heat Therapy   Number Minutes Moist Heat 15 Minutes   Moist Heat Location Cervical  thoracic     Electrical Stimulation   Electrical Stimulation Location Lt upper trap/levator/rhomboids   Electrical Stimulation Action IFC   Electrical Stimulation Parameters to tolerance   Electrical Stimulation Goals Pain;Tone     Manual Therapy   Manual Therapy Soft tissue mobilization   Soft tissue mobilization Lt upper trap/levator and paraspinals Lt thoracic          Trigger Point Dry Needling - 11/24/15 0731    Consent Given? Yes   Education Handout Provided Yes   Muscles Treated Upper Body Longissimus;Levator scapulae   Levator Scapulae Response Palpable increased muscle length;Twitch response elicited  Lt   Longissimus Response Palpable increased muscle length;Twitch response elicited  Lt, T 2-4              PT Education - 11/24/15 [id number]    Education provided Yes   Education  Details TDN   Person(s) Educated Patient   Methods Explanation;Handout   Comprehension Verbalized understanding             PT Long Term Goals - 11/24/15 0724      PT LONG TERM GOAL #1   Title I with advanced HEP ( 12/02/15)   Time 4   Period Weeks   Status On-going     PT LONG TERM GOAL #2   Title improve FOTO =/< 30% limited ( 12/02/15)   Time 4   Period Weeks   Status On-going     PT LONG TERM GOAL #3   Title report overall Lt shoulder pain reduction =/> 75% with overhead activities ( 12/02/15)    Time 4   Period Weeks   Status On-going     PT LONG TERM GOAL #4   Title demo upper back and Lt shoulder strength -/> 5-/5 to allow return to prior level of kyaking/paddle boarding ( 12/02/15)    Time 4   Period Weeks   Status On-going     PT LONG TERM GOAL #5   Title sleep on her Lt side per her previous level without Lt shoulder pain ( 12/02/15)   Time 4    Period Weeks   Status On-going               Plan - 11/24/15 0759    Clinical Impression Statement This is Stephanie Chase's second visit, she has had minimal improvement however she has not had time to perform her HEP per instruction, had a lot of family stress and driving.  She had some good releases with TDN.  Hopefully she will be able to return for treatment this week or next.    Rehab Potential Good   PT Frequency 2x / week   PT Duration 4 weeks   PT Treatment/Interventions Moist Heat;Ultrasound;Dry needling;Therapeutic exercise;Taping;Vasopneumatic Device;Cryotherapy;Electrical Stimulation;Iontophoresis 4mg /ml Dexamethasone;Passive range of motion;Patient/family education   PT Next Visit Plan assess response to TDN,possibly needle her pecs, scap stability   Consulted and Agree with Plan of Care Patient      Patient will benefit from skilled therapeutic intervention in order to improve the following deficits and impairments:  Postural dysfunction, Decreased strength, Pain, Impaired UE functional use  Visit Diagnosis: Pain in left shoulder  Muscle weakness (generalized)  Abnormal posture     Problem List Patient Active Problem List   Diagnosis Date Noted  . Acute bursitis of left shoulder 10/21/2015  . Occupational bronchitis ([place]) 03/03/2015  . Hyperlipidemia 03/30/2014  . Dyspepsia 07/31/2012  . Difficulty hearing 02/13/2012  . Hypothyroidism 01/26/2012  . History of lupus 12/21/2011    Jeral Pinch PT  11/24/2015, 8:01 AM  [facility] [place], [place], [address] Phone: [phone]   Fax:  [phone]  Name: Stephanie Chase MRN: [id number] Date of Birth: 1960/08/28

## 2015-11-24 NOTE — Patient Instructions (Signed)

## 2015-12-02 ENCOUNTER — Encounter: Payer: Self-pay | Admitting: Physical Therapy

## 2015-12-02 ENCOUNTER — Ambulatory Visit (INDEPENDENT_AMBULATORY_CARE_PROVIDER_SITE_OTHER): Payer: BLUE CROSS/BLUE SHIELD | Admitting: Physical Therapy

## 2015-12-02 DIAGNOSIS — M25512 Pain in left shoulder: Secondary | ICD-10-CM | POA: Diagnosis not present

## 2015-12-02 DIAGNOSIS — R293 Abnormal posture: Secondary | ICD-10-CM

## 2015-12-02 DIAGNOSIS — M6281 Muscle weakness (generalized): Secondary | ICD-10-CM

## 2015-12-02 NOTE — Therapy (Signed)
Stephanie Chase [place], [place], [address] Phone: [phone]   Fax:  [phone]  Physical Therapy Treatment  Patient Details  Name: Stephanie Chase MRN: [id number] Date of Birth: May 20, 1960 Referring Provider: Dr Stephanie Chase  Encounter Date: 12/02/2015      PT End of Session - 12/02/15 1621    Visit Number 3   Number of Visits 6   Date for PT Re-Evaluation 12/02/15   PT Start Time 9702   PT Stop Time 1717   PT Time Calculation (min) 56 min      Past Medical History:  Diagnosis Date  . Lupus ([place])   . Thyroid disease     Past Surgical History:  Procedure Laterality Date  . BREAST LUMPECTOMY    . TONSILLECTOMY    . WISDOM TOOTH EXTRACTION      There were no vitals filed for this visit.      Subjective Assessment - 12/02/15 1622    Subjective Pt has some tenderness in the mid back thoracic area. She was sore for about 2 days then she felt really good, Lt side of her neck feels stiff.    Currently in Pain? Yes   Pain Score 2    Pain Location Shoulder   Pain Orientation Left  thoracic/shoulder blade area   Pain Descriptors / Indicators Dull   Pain Type Acute pain   Pain Onset More than a month ago   Pain Frequency Intermittent   Aggravating Factors  not sure   Pain Relieving Factors resting            [facility] PT Assessment - 12/02/15 0001      Assessment   Medical Diagnosis Lt shoulder bursitis     Observation/Other Assessments   Focus on Therapeutic Outcomes (FOTO)  32% limited     Strength   Left Shoulder Flexion 5/5   Left Shoulder ABduction 5/5   Left Shoulder Internal Rotation 5/5   Left Shoulder External Rotation --  5-/5                     [facility] Adult PT Treatment/Exercise - 12/02/15 0001      Exercises   Exercises Shoulder     Shoulder Exercises: Supine   Other Supine Exercises pressure release with yoga blocks, thoracic spine. T8-T3     Shoulder  Exercises: Standing   External Rotation Strengthening;Both;Theraband  3x10   Theraband Level (Shoulder External Rotation) Level 2 (Red)     Shoulder Exercises: ROM/Strengthening   UBE (Upper Arm Bike) L2 x 4' alt FWD/BWD     Modalities   Modalities Electrical Stimulation;Moist Heat     Moist Heat Therapy   Number Minutes Moist Heat 15 Minutes   Moist Heat Location --  thoracic spine     Electrical Stimulation   Electrical Stimulation Location Lt thoracic area   Electrical Stimulation Action IFC    Electrical Stimulation Parameters tolerance   Electrical Stimulation Goals Pain;Tone     Manual Therapy   Manual Therapy Soft tissue mobilization;Scapular mobilization   Soft tissue mobilization Lt rhomboids and thoracic longisiimus   Scapular Mobilization Lt scapula, all directions.           Trigger Point Dry Needling - 12/02/15 1633    Consent Given? Yes   Education Handout Provided No   Muscles Treated Upper Body Longissimus;Rhomboids  Lt   Rhomboids Response Palpable increased muscle length;Twitch response elicited   Longissimus Response Palpable increased  muscle length;Twitch response elicited  Y8-1              PT Education - 12/02/15 1629    Education Details progress to red t-band   Person(s) Educated Patient   Methods Explanation   Comprehension Verbalized understanding;Returned demonstration             PT Long Term Goals - 12/02/15 1625      PT LONG TERM GOAL #1   Title I with advanced HEP ( 12/02/15)   Status On-going     PT LONG TERM GOAL #2   Title improve FOTO =/< 30% limited ( 12/02/15)  currently 32% limited   Status On-going     PT LONG TERM GOAL #3   Title report overall Lt shoulder pain reduction =/> 75% with overhead activities ( 12/02/15)    Status Achieved     PT LONG TERM GOAL #4   Title demo upper back and Lt shoulder strength -/> 5-/5 to allow return to prior level of kyaking/paddle boarding ( 12/02/15)    Status Partially Met   strength goal met, hasn't attempted kyaking or paddle boarding     PT LONG TERM GOAL #5   Title sleep on her Lt side per her previous level without Lt shoulder pain ( 12/02/15)   Status On-going  patient has not attempted yet               Plan - 12/02/15 1722    Clinical Impression Statement Stephanie Chase responded well to TDN last session and again this session. She has met some of her goals and making good progress to the others.  She hasn't tried some activities, she will this next week.    Rehab Potential Good   PT Frequency 2x / week   PT Duration 4 weeks   PT Treatment/Interventions Moist Heat;Ultrasound;Dry needling;Therapeutic exercise;Taping;Vasopneumatic Device;Cryotherapy;Electrical Stimulation;Iontophoresis 4m/ml Dexamethasone;Passive range of motion;Patient/family education   PT Next Visit Plan assess for discharged    Consulted and Agree with Plan of Care Patient      Patient will benefit from skilled therapeutic intervention in order to improve the following deficits and impairments:  Postural dysfunction, Decreased strength, Pain, Impaired UE functional use  Visit Diagnosis: Pain in left shoulder  Muscle weakness (generalized)  Abnormal posture     Problem List Patient Active Problem List   Diagnosis Date Noted  . Acute bursitis of left shoulder 10/21/2015  . [place] 03/03/2015  . Hyperlipidemia 03/30/2014  . Dyspepsia 07/31/2012  . Difficulty hearing 02/13/2012  . Hypothyroidism 01/26/2012  . History of lupus 12/21/2011    SJeral PinchPT  12/02/2015, 5:24 PM  [facility] [address]Phone: [phone]  Fax:  [phone] Name: DAshima ShrakeMRN: [id number] of Birth: [birth year]

## 2015-12-08 ENCOUNTER — Ambulatory Visit (INDEPENDENT_AMBULATORY_CARE_PROVIDER_SITE_OTHER): Payer: BLUE CROSS/BLUE SHIELD | Admitting: Physical Therapy

## 2015-12-08 ENCOUNTER — Encounter: Payer: Self-pay | Admitting: Physical Therapy

## 2015-12-08 DIAGNOSIS — R293 Abnormal posture: Secondary | ICD-10-CM

## 2015-12-08 DIAGNOSIS — M6281 Muscle weakness (generalized): Secondary | ICD-10-CM

## 2015-12-08 DIAGNOSIS — M25512 Pain in left shoulder: Secondary | ICD-10-CM

## 2015-12-08 NOTE — Therapy (Signed)
[place], [place], [address] Phone: [phone]   Fax:  [phone]  Physical Therapy Treatment  Patient Details  Name: Stephanie Chase MRN: [id number] Date of Birth: Aug 28, 1960 Referring Provider: Dr Dianah Field  Encounter Date: 12/08/2015      PT End of Session - 12/08/15 0716    Visit Number 4   Number of Visits 10   Date for PT Re-Evaluation 12/29/15   PT Start Time 0717   PT Stop Time 0815   PT Time Calculation (min) 58 min      Past Medical History:  Diagnosis Date  . Lupus ([place])   . Thyroid disease     Past Surgical History:  Procedure Laterality Date  . BREAST LUMPECTOMY    . TONSILLECTOMY    . WISDOM TOOTH EXTRACTION      There were no vitals filed for this visit.      Subjective Assessment - 12/08/15 0718    Subjective Pt reports that when she used the stim she turned it up and then had a lot of pain until Monday. Worked on a patio, leaning forward a lot as she was able over the weekend. Husband tried to massage her back and she had referred pain inot her head and down the buttocks.    Patient Stated Goals get rid of pain and do her activites   Currently in Pain? Yes   Pain Location Shoulder   Pain Orientation Left   Pain Descriptors / Indicators --  electricity, dull headache   Pain Type Acute pain   Pain Radiating Towards up into neck and head   Pain Frequency Intermittent   Aggravating Factors  leaning forward and the stim last week, depends on movemnet   Pain Relieving Factors ibuprofen and massage.             [facility] PT Assessment - 12/08/15 0001      Assessment   Medical Diagnosis Lt shoulder bursitis   Referring Provider Dr Dianah Field   Onset Date/Surgical Date 08/04/15   Hand Dominance Right   Next MD Visit PRN     Observation/Other Assessments   Focus on Therapeutic Outcomes (FOTO)  32% limited     Posture/Postural Control   Postural  Limitations Forward head;Decreased thoracic kyphosis;Rounded Shoulders     Strength   Overall Strength Comments not assessed today     Palpation   Palpation comment trigger point still palpable in Lt rhomboids, not as large, also has tightness in cervical parapinals and periocciput                     [facility] Adult PT Treatment/Exercise - 12/08/15 0001      Shoulder Exercises: ROM/Strengthening   UBE (Upper Arm Bike) L2 x 4' alt FWD/BWD, scapular squeezes around noodle in standing. Cervical side bend stretches.      Modalities   Modalities Ultrasound;Moist Heat     Moist Heat Therapy   Number Minutes Moist Heat 15 Minutes   Moist Heat Location Cervical  and thoracic     Ultrasound   Ultrasound Location Lt mid rhomboids   Ultrasound Parameters combo US/stim, 100%, 1.5 w/cm2, 1.42mz   Ultrasound Goals Pain  tone     Manual Therapy   Manual Therapy Joint mobilization;Soft tissue mobilization;Taping   Manual therapy comments taping I strip bilat thoracic paraspinals  and on Lt rhomboids   Joint Mobilization grade II CPA thoracic & cervical, slight hypomobility, mnimal discomfort  per pt   Soft tissue mobilization Lt rhomboids/paraspinals and occipital base release                     PT Long Term Goals - 12/08/15 0739      PT LONG TERM GOAL #1   Title I with advanced HEP ( 12/29/15)   Time 3   Period Weeks   Status On-going     PT LONG TERM GOAL #2   Title improve FOTO =/< 30% limited ( 12/29/15)   Time 3   Period Weeks   Status On-going     PT LONG TERM GOAL #3   Title report overall Lt shoulder pain reduction =/> 75% with overhead activities ( 12/29/15)    Time 3   Period Weeks   Status On-going  was met last week, flared up since then     PT LONG TERM GOAL #4   Title demo upper back and Lt shoulder strength -/> 5-/5 to allow return to prior level of kyaking/paddle boarding ( 12/29/15)    Time 3   Period Weeks   Status Partially Met      PT LONG TERM GOAL #5   Title sleep on her Lt side per her previous level without Lt shoulder pain ( 12/29/15)   Time 3   Period Weeks   Status On-going               Plan - 12/08/15 0900    Clinical Impression Statement Stephanie Chase was doing well until the end of last week when she turned stim up to high and then worked on her deck. She feels like the pain has returned.  She still has trigger points in the Lt rhomboids that send referred pain into her shoulder and neck/head.  Goals were partially met last week however she has regressed now and would benefit from more treatment.  She was also limited in her attendance of PT sessions due to work schedule and only had 4 visits vs 8.    Rehab Potential Good   PT Frequency 2x / week   PT Duration 4 weeks   PT Treatment/Interventions Moist Heat;Ultrasound;Dry needling;Therapeutic exercise;Taping;Vasopneumatic Device;Cryotherapy;Electrical Stimulation;Iontophoresis 15m/ml Dexamethasone;Passive range of motion;Patient/family education   PT Next Visit Plan assess response to combo and taping.       Patient will benefit from skilled therapeutic intervention in order to improve the following deficits and impairments:  Postural dysfunction, Decreased strength, Pain, Impaired UE functional use  Visit Diagnosis: Abnormal posture - Plan: PT plan of care cert/re-cert  Muscle weakness (generalized) - Plan: PT plan of care cert/re-cert  Pain in left shoulder - Plan: PT plan of care cert/re-cert     Problem List Patient Active Problem List   Diagnosis Date Noted  . Acute bursitis of left shoulder 10/21/2015  . [place] 03/03/[place]  . Hyperlipidemia 03/30/2014  . Dyspepsia 07/31/2012  . Difficulty hearing 02/13/2012  . Hypothyroidism 01/26/2012  . History of lupus 12/21/2011    N[place]ota Elsen,SUE 12/08/2015, 9:06 AM  [facility]  [address]Phone: [phone]  Fax:  [phone] Name: Stephanie CornickMRN: [id number] of Birth: 917-May-1962

## 2015-12-22 ENCOUNTER — Encounter: Payer: BLUE CROSS/BLUE SHIELD | Admitting: Physical Therapy

## 2015-12-29 ENCOUNTER — Ambulatory Visit (INDEPENDENT_AMBULATORY_CARE_PROVIDER_SITE_OTHER): Payer: BLUE CROSS/BLUE SHIELD | Admitting: Physical Therapy

## 2015-12-29 ENCOUNTER — Encounter: Payer: Self-pay | Admitting: Physical Therapy

## 2015-12-29 DIAGNOSIS — M25512 Pain in left shoulder: Secondary | ICD-10-CM

## 2015-12-29 DIAGNOSIS — M6281 Muscle weakness (generalized): Secondary | ICD-10-CM

## 2015-12-29 DIAGNOSIS — R293 Abnormal posture: Secondary | ICD-10-CM

## 2015-12-29 NOTE — Therapy (Addendum)
Stephanie Chase [place], [place], [address] Phone: [phone]   Fax:  [phone]  Physical Therapy Treatment  Patient Details  Name: Stephanie Chase MRN: [id number] Date of Birth: May 14, 1960 Referring Provider: Dr Dianah Field  Encounter Date: 12/29/2015      PT End of Session - 12/29/15 0718    Visit Number 5   Number of Visits 10   Date for PT Re-Evaluation 02/09/16   PT Start Time 0719   PT Stop Time 0803   PT Time Calculation (min) 44 min      Past Medical History:  Diagnosis Date  . Lupus   . Thyroid disease     Past Surgical History:  Procedure Laterality Date  . BREAST LUMPECTOMY    . TONSILLECTOMY    . WISDOM TOOTH EXTRACTION      There were no vitals filed for this visit.      Subjective Assessment - 12/29/15 0723    Subjective Stephanie Chase states she did some kayak and paddle boarding and is still having issues with her shoulder, the pain is moving into the neck and creating HA, also moving to the Lt side.    Currently in Pain? Yes   Pain Score --  varies with day and time from 1-8/10   Pain Location Neck   Pain Orientation Right;Left   Pain Descriptors / Indicators Aching  " sick neck feeling"    Pain Type Acute pain   Pain Onset More than a month ago   Pain Frequency Constant   Aggravating Factors  sleeping on Lt side, paddle board, kayak   Pain Relieving Factors stretches, medicine            [facility] PT Assessment - 12/29/15 0001      Assessment   Medical Diagnosis Lt shoulder bursitis   Referring Provider Dr Dianah Field   Onset Date/Surgical Date 08/04/15   Hand Dominance Right     Observation/Other Assessments   Focus on Therapeutic Outcomes (FOTO)  35% limited     Strength   Overall Strength Comments mid traps 4/5   Strength Assessment Site Shoulder   Left Shoulder Flexion 5/5   Left Shoulder ABduction 5/5   Left Shoulder Internal Rotation 5/5  supraspinatus  5-/5   Left Shoulder External Rotation 4+/5  Rt 5-/5                     [facility] Adult PT Treatment/Exercise - 12/29/15 0001      Exercises   Exercises Shoulder     Shoulder Exercises: Supine   Other Supine Exercises 8 reps, 5 sec holds head presses     Shoulder Exercises: Prone   Retraction Strengthening;Both;Weights  3x8 with arms ER    Retraction Weight (lbs) 2     Shoulder Exercises: Sidelying   External Rotation Strengthening;Both;Weights  3x10   External Rotation Weight (lbs) 2     Shoulder Exercises: ROM/Strengthening   UBE (Upper Arm Bike) L2 x 4' alt FWD/BWD     Shoulder Exercises: Stretch   Other Shoulder Stretches neck stretches                PT Education - 12/29/15 1322    Education provided Yes             PT Long Term Goals - 12/29/15 0718      PT LONG TERM GOAL #1   Title I with advanced HEP ( 02/09/16)   Time 6  Period Weeks   Status On-going     PT LONG TERM GOAL #2   Title improve FOTO =/< 30% limited ( 02/09/16)   Time 6   Status On-going     PT LONG TERM GOAL #3   Title report overall Lt shoulder pain reduction =/> 75% with overhead activities ( 02/09/16)    Time 6   Period Weeks   Status On-going     PT LONG TERM GOAL #4   Title demo upper back and Lt shoulder strength -/> 5-/5 to allow return to prior level of kyaking/paddle boarding ( 02/09/16)    Time 6   Period Weeks   Status On-going     PT LONG TERM GOAL #5   Title sleep on her Lt side per her previous level without Lt shoulder pain ( 02/09/16)   Time 6   Period Weeks   Status On-going               Plan - 12/29/15 0754    Clinical Impression Statement Stephanie Chase is having a hard time coming to the clinic for treatment and hasn't put a lot of focus on formal exercise program.  She was thinking that her normal activites would be enough to strengthen.  She has had continued Lt shoulder issues that are now starting to move into her neck and  some to the Rt side.  Her ROM is still good, does have higher level weakness in the upper body.  She will work harder on her HEP and try to attend therapy more consistently.    Rehab Potential Good   PT Frequency 2x / week   PT Duration 4 weeks   PT Treatment/Interventions Moist Heat;Ultrasound;Dry needling;Therapeutic exercise;Taping;Vasopneumatic Device;Cryotherapy;Electrical Stimulation;Iontophoresis 84m/ml Dexamethasone;Passive range of motion;Patient/family education   PT Next Visit Plan pt will be unavailable to attend sessions for 2 wks.    Consulted and Agree with Plan of Care Patient      Patient will benefit from skilled therapeutic intervention in order to improve the following deficits and impairments:  Postural dysfunction, Decreased strength, Pain, Impaired UE functional use  Visit Diagnosis: Abnormal posture - Plan: PT plan of care cert/re-cert  Muscle weakness (generalized) - Plan: PT plan of care cert/re-cert  Acute pain of left shoulder - Plan: PT plan of care cert/re-cert     Problem List Patient Active Problem List   Diagnosis Date Noted  . Acute bursitis of left shoulder 10/21/2015  . Occupational bronchitis ([place] 03/03/2015  . Hyperlipidemia 03/30/2014  . Dyspepsia 07/31/2012  . Difficulty hearing 02/13/2012  . Hypothyroidism 01/26/2012  . History of lupus 12/21/2011    SJeral PinchPT  12/29/2015, 1:26 PM  [facility] [place] [address]Phone: [phone]  Fax:  [phone] Name: DClaritza JulyMRN: [id number] of Birth: [birth year] PHYSICAL THERAPY DISCHARGE SUMMARY  Visits from Start of Care: 5  Current functional level related to goals / functional outcomes: unknown  Remaining deficits: Unknown, patient reports she is unable to attend sessions    Education / Equipment: HEP  Plan: Patient agrees to discharge.  Patient goals were partially met. Patient  is being discharged due to the patient's request.  ?????     [name] 02/21/16 3:17 PM

## 2015-12-29 NOTE — Patient Instructions (Addendum)
Head Press With [place]    Tuck chin SLIGHTLY toward chest, keep mouth closed. Feel weight on back of head. Increase weight by pressing head down. Hold __5_ seconds. Relax. Repeat _8__ times. Surface: floor. Once a day  Progressive Resisted: External Rotation (Side-Lying)    Holding __2__ pound weight, towel under arm, raise right forearm toward ceiling. Keep elbow bent and at side. Repeat _10___ times per set. Do __3__ sets per session. Do __1__ sessions per day.   Strengthening: Horizontal Abduction - with External Rotation (Prone)    Holding __2__ pound weights, raise arms out from sides, pinching shoulder blades. Keep elbows straight, thumbs up. Repeat __8-10__ times per set. Do __3__ sets per session. Do __1__ sessions per day. Copyright  [employer]. All rights reserved.

## 2016-03-31 ENCOUNTER — Encounter: Payer: Self-pay | Admitting: Sports Medicine

## 2016-03-31 ENCOUNTER — Ambulatory Visit (INDEPENDENT_AMBULATORY_CARE_PROVIDER_SITE_OTHER): Payer: BLUE CROSS/BLUE SHIELD | Admitting: Sports Medicine

## 2016-03-31 DIAGNOSIS — F4320 Adjustment disorder, unspecified: Secondary | ICD-10-CM | POA: Diagnosis not present

## 2016-03-31 DIAGNOSIS — F321 Major depressive disorder, single episode, moderate: Secondary | ICD-10-CM | POA: Insufficient documentation

## 2016-03-31 DIAGNOSIS — Z0000 Encounter for general adult medical examination without abnormal findings: Secondary | ICD-10-CM

## 2016-03-31 DIAGNOSIS — E039 Hypothyroidism, unspecified: Secondary | ICD-10-CM | POA: Diagnosis not present

## 2016-03-31 LAB — COMPREHENSIVE METABOLIC PANEL
ALT: 15 U/L (ref 6–29)
AST: 20 U/L (ref 10–35)
Albumin: 4.1 g/dL (ref 3.6–5.1)
Alkaline Phosphatase: 74 U/L (ref 33–130)
BUN: 9 mg/dL (ref 7–25)
CO2: 26 mmol/L (ref 20–31)
Calcium: 10.1 mg/dL (ref 8.6–10.4)
Chloride: 106 mmol/L (ref 98–110)
Creat: 0.63 mg/dL (ref 0.50–1.05)
Glucose, Bld: 76 mg/dL (ref 65–99)
Potassium: 4.1 mmol/L (ref 3.5–5.3)
Sodium: 140 mmol/L (ref 135–146)
Total Bilirubin: 0.4 mg/dL (ref 0.2–1.2)
Total Protein: 6.8 g/dL (ref 6.1–8.1)

## 2016-03-31 LAB — LIPID PANEL
Cholesterol: 249 mg/dL — ABNORMAL HIGH (ref <200)
HDL: 58 mg/dL (ref >50)
LDL Cholesterol: 161 mg/dL — ABNORMAL HIGH (ref <100)
Total CHOL/HDL Ratio: 4.3 Ratio (ref <5.0)
Triglycerides: 149 mg/dL (ref <150)
VLDL: 30 mg/dL (ref <30)

## 2016-03-31 LAB — CBC
HCT: 43.6 % (ref 35.0–45.0)
Hemoglobin: 14.7 g/dL (ref 11.7–15.5)
MCH: 29.9 pg (ref 27.0–33.0)
MCHC: 33.7 g/dL (ref 32.0–36.0)
MCV: 88.6 fL (ref 80.0–100.0)
MPV: 10.5 fL (ref 7.5–12.5)
Platelets: 289 10*3/uL (ref 140–400)
RBC: 4.92 MIL/uL (ref 3.80–5.10)
RDW: 13.5 % (ref 11.0–15.0)
WBC: 8.7 10*3/uL (ref 3.8–10.8)

## 2016-03-31 LAB — HEMOGLOBIN A1C
Hgb A1c MFr Bld: 5.1 % (ref <5.7)
Mean Plasma Glucose: 100 mg/dL

## 2016-03-31 LAB — T4, FREE: Free T4: 1.1 ng/dL (ref 0.8–1.8)

## 2016-03-31 LAB — HIV ANTIBODY (ROUTINE TESTING W REFLEX): HIV 1&2 Ab, 4th Generation: NONREACTIVE

## 2016-03-31 LAB — T3, FREE: T3, Free: 3.8 pg/mL (ref 2.3–4.2)

## 2016-03-31 LAB — TSH: TSH: 1.53 mIU/L

## 2016-03-31 NOTE — Progress Notes (Signed)
  Subjective:    CC: follow-up  HPI: Stephanie Chase returns for multiple issues, she has hypothyroidism, hyperlipidemia and needs updated blood work. She is up-to-date on her cervical cancer screening.  A family member just attempted suicide.  She is taking CBD oil for her current anxiety.  Doesn't want to try moderate medical techniques just yet.  Past medical history:  Negative.  See flowsheet/record as well for more information.  Surgical history: Negative.  See flowsheet/record as well for more information.  Family history: Negative.  See flowsheet/record as well for more information.  Social history: Negative.  See flowsheet/record as well for more information.  Allergies, and medications have been entered into the medical record, reviewed, and no changes needed.   Review of Systems: No fevers, chills, night sweats, weight loss, chest pain, or shortness of breath.   Objective:    General: Well Developed, well nourished, and in no acute distress.  Neuro: Alert and oriented x3, extra-ocular muscles intact, sensation grossly intact.  HEENT: Normocephalic, atraumatic, pupils equal round reactive to light, neck supple, no masses, no lymphadenopathy, thyroid nonpalpable.  Skin: Warm and dry, no rashes. Cardiac: Regular rate and rhythm, no murmurs rubs or gallops, no lower extremity edema.  Respiratory: Clear to auscultation bilaterally. Not using accessory muscles, speaking in full sentences.  Impression and Recommendations:    Annual physical exam Checking routine bloodwork.  Adjustment disorder Recent suicide attempt by a family member. Taking CBD oil. She will try this first before we discuss allopathic and proven methods.  I spent 25 minutes with this patient, greater than 50% was face-to-face time counseling regarding the above diagnoses

## 2016-03-31 NOTE — Assessment & Plan Note (Signed)
Checking routine bloodwork. 

## 2016-03-31 NOTE — Assessment & Plan Note (Signed)
Recent suicide attempt by a family member. Taking CBD oil. She will try this first before we discuss allopathic and proven methods.

## 2016-04-01 LAB — VITAMIN D 25 HYDROXY (VIT D DEFICIENCY, FRACTURES): Vit D, 25-Hydroxy: 30 ng/mL (ref 30–100)

## 2016-04-03 ENCOUNTER — Other Ambulatory Visit: Payer: Self-pay

## 2016-04-03 MED ORDER — LEVOTHYROXINE SODIUM 75 MCG PO TABS: 75.0000 ug | ORAL_TABLET | Freq: Every day | ORAL | 1 refills | Status: DC

## 2016-04-03 MED ORDER — LIOTHYRONINE SODIUM 5 MCG PO TABS: ORAL_TABLET | ORAL | 1 refills | Status: DC

## 2016-04-04 ENCOUNTER — Encounter: Payer: Self-pay | Admitting: Sports Medicine

## 2016-04-04 DIAGNOSIS — B070 Plantar wart: Secondary | ICD-10-CM

## 2016-04-26 ENCOUNTER — Encounter: Payer: Self-pay | Admitting: Podiatry

## 2016-04-26 ENCOUNTER — Ambulatory Visit (INDEPENDENT_AMBULATORY_CARE_PROVIDER_SITE_OTHER): Payer: BLUE CROSS/BLUE SHIELD | Admitting: Podiatry

## 2016-04-26 VITALS — Ht 65.0 in | Wt 152.0 lb

## 2016-04-26 DIAGNOSIS — M79671 Pain in right foot: Secondary | ICD-10-CM

## 2016-04-26 DIAGNOSIS — L851 Acquired keratosis [keratoderma] palmaris et plantaris: Secondary | ICD-10-CM

## 2016-04-26 DIAGNOSIS — L720 Epidermal cyst: Secondary | ICD-10-CM

## 2016-04-26 NOTE — Progress Notes (Signed)
SUBJECTIVE: 56 y.o. year old female presents complaining of painful plantar's wart or callus on right foot duration of 2 years at the base of 5th metatarsal.  Has had similar lesion under the 2nd and 3rd metatarsla neck area that was cut out 10 years ago, which has returned. Both lesions are very painful with weight bearing and ambulation. She is doing self care to relieve pain trimming callused lesion from the right foot.  Patient is seeking all available treatment options.   REVIEW OF SYSTEMS: Pertinent items noted in HPI and remainder of comprehensive ROS otherwise negative.  OBJECTIVE: DERMATOLOGIC EXAMINATION: 1.2 cm diameter circular hard keratotic lesion under the 2nd and 3rd metatarsal neck area left foot, recurring lesion. Deep well outlined porokeratotic lesion under the 5th metatarsal shaft about 2 cm distal to metatarsal base.  Lesions have no associated edema or erythema.   VASCULAR EXAMINATION OF LOWER LIMBS: All pedal pulses are palpable with normal pulsation.  Capillary Filling times within 3 seconds in all digits.  No edema or erythema noted. Temperature gradient from tibial crest to dorsum of foot is within normal bilateral.  NEUROLOGIC EXAMINATION OF THE LOWER LIMBS: All epicritic and tactile sensations grossly intact bilateral.  MUSCULOSKELETAL EXAMINATION: Positive for elevated first ray bilateral.  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 Pain with ambulation right foot.  Plantar dermoid inclusion cyst 0.8 cm near 5th metatarsal base area right foot.  PLAN: Reviewed clinical findings and available treatment options. Lesions were too painful to trim down at the office. Patient is seeking surgical intervention. Reviewed surgical excision of larger lesion with skin plasty and excision of inclusion cyst.

## 2016-04-26 NOTE — Patient Instructions (Addendum)
Seen for painful lesion under the right foot. Recurring lesion under 2nd and 3rd metatarsal shaft area with pain. Porokeratotic circular 0.7 cm lesion possible foreign body induced. Reviewed all available treatment options, excision lesions with skin plasty left plantar surface. Will do pre op on next visit.

## 2016-05-15 ENCOUNTER — Ambulatory Visit: Payer: Self-pay | Admitting: Podiatry

## 2016-09-11 ENCOUNTER — Ambulatory Visit (INDEPENDENT_AMBULATORY_CARE_PROVIDER_SITE_OTHER): Payer: BLUE CROSS/BLUE SHIELD | Admitting: Sports Medicine

## 2016-09-11 ENCOUNTER — Encounter: Payer: Self-pay | Admitting: Sports Medicine

## 2016-09-11 DIAGNOSIS — F321 Major depressive disorder, single episode, moderate: Secondary | ICD-10-CM

## 2016-09-11 MED ORDER — SERTRALINE HCL 25 MG PO TABS: 25.0000 mg | ORAL_TABLET | Freq: Every day | ORAL | 3 refills | Status: DC

## 2016-09-11 NOTE — Progress Notes (Signed)
  Subjective:    CC: Needs paperwork filled out  HPI: Mood disorder: Feels severe depression and anxiety, had her family number attempt suicide again. She has no suicidal or homicidal ideation herself.  Past medical history:  Negative.  See flowsheet/record as well for more information.  Surgical history: Negative.  See flowsheet/record as well for more information.  Family history: Negative.  See flowsheet/record as well for more information.  Social history: Negative.  See flowsheet/record as well for more information.  Allergies, and medications have been entered into the medical record, reviewed, and no changes needed.   Review of Systems: No fevers, chills, night sweats, weight loss, chest pain, or shortness of breath.   Objective:    General: Well Developed, well nourished, and in no acute distress.  Neuro: Alert and oriented x3, extra-ocular muscles intact, sensation grossly intact.  HEENT: Normocephalic, atraumatic, pupils equal round reactive to light, neck supple, no masses, no lymphadenopathy, thyroid nonpalpable.  Skin: Warm and dry, no rashes. Cardiac: Regular rate and rhythm, no murmurs rubs or gallops, no lower extremity edema.  Respiratory: Clear to auscultation bilaterally. Not using accessory muscles, speaking in full sentences.  Impression and Recommendations:    Depression, major, single episode, moderate (HCC)  Agreeable to try an SSRI, starting Zoloft 25 mg, return in one month for a PHQ9 and GAD7.  I gave her 4 weeks of FMLA.  I spent 25 minutes with this patient, greater than 50% was face-to-face time counseling regarding the above diagnoses

## 2016-09-11 NOTE — Assessment & Plan Note (Signed)
Agreeable to try an SSRI, starting Zoloft 25 mg, return in one month for a PHQ9 and GAD7.  I gave her 4 weeks of FMLA.

## 2016-09-20 ENCOUNTER — Other Ambulatory Visit: Payer: Self-pay

## 2016-09-20 MED ORDER — LEVOTHYROXINE SODIUM 75 MCG PO TABS: 75.0000 ug | ORAL_TABLET | Freq: Every day | ORAL | 1 refills | Status: DC

## 2016-09-22 ENCOUNTER — Other Ambulatory Visit: Payer: Self-pay

## 2016-09-22 ENCOUNTER — Encounter: Payer: Self-pay | Admitting: Sports Medicine

## 2016-09-22 MED ORDER — LIOTHYRONINE SODIUM 5 MCG PO TABS: ORAL_TABLET | ORAL | 1 refills | Status: DC

## 2016-09-22 MED ORDER — LEVOTHYROXINE SODIUM 75 MCG PO TABS
75.0000 ug | ORAL_TABLET | Freq: Every day | ORAL | 1 refills | Status: DC
Start: 2016-09-22 — End: 2017-03-22

## 2016-10-06 ENCOUNTER — Ambulatory Visit: Payer: BLUE CROSS/BLUE SHIELD | Admitting: Sports Medicine

## 2016-12-20 ENCOUNTER — Other Ambulatory Visit: Payer: Self-pay

## 2016-12-20 ENCOUNTER — Encounter: Payer: Self-pay | Admitting: Sports Medicine

## 2016-12-20 DIAGNOSIS — J709 Respiratory conditions due to unspecified external agent: Secondary | ICD-10-CM

## 2016-12-20 MED ORDER — ALBUTEROL SULFATE 108 (90 BASE) MCG/ACT IN AEPB: 2.0000 | INHALATION_SPRAY | Freq: Four times a day (QID) | RESPIRATORY_TRACT | 3 refills | Status: DC | PRN

## 2017-03-22 ENCOUNTER — Other Ambulatory Visit: Payer: Self-pay

## 2017-03-22 MED ORDER — LEVOTHYROXINE SODIUM 75 MCG PO TABS: 75.0000 ug | ORAL_TABLET | Freq: Every day | ORAL | 1 refills | Status: DC

## 2017-03-22 MED ORDER — LIOTHYRONINE SODIUM 5 MCG PO TABS: ORAL_TABLET | ORAL | 1 refills | Status: DC

## 2017-09-10 ENCOUNTER — Telehealth: Payer: Self-pay | Admitting: Sports Medicine

## 2017-09-10 DIAGNOSIS — Z0000 Encounter for general adult medical examination without abnormal findings: Secondary | ICD-10-CM

## 2017-09-10 NOTE — Telephone Encounter (Signed)
Thank you :)

## 2017-09-10 NOTE — Telephone Encounter (Signed)
Routine labs ordered including T3 and T4 levels.

## 2017-09-10 NOTE — Telephone Encounter (Signed)
Pt called. She is scheduled for physical/biometric screening on 6/21.  She would like to have a lab order sent down for Wednesday 6/19.Marland Kitchenplease add T3 and T4 to order.

## 2017-09-13 LAB — LIPID PANEL W/REFLEX DIRECT LDL
Cholesterol: 254 mg/dL — ABNORMAL HIGH (ref <200)
HDL: 60 mg/dL (ref >50)
LDL Cholesterol (Calc): 168 mg/dL (calc) — ABNORMAL HIGH
Non-HDL Cholesterol (Calc): 194 mg/dL (calc) — ABNORMAL HIGH (ref <130)
Total CHOL/HDL Ratio: 4.2 (calc) (ref <5.0)
Triglycerides: 128 mg/dL (ref <150)

## 2017-09-13 LAB — HEMOGLOBIN A1C
Hgb A1c MFr Bld: 5.2 % of total Hgb (ref <5.7)
Mean Plasma Glucose: 103 (calc)
eAG (mmol/L): 5.7 (calc)

## 2017-09-13 LAB — COMPREHENSIVE METABOLIC PANEL
AG Ratio: 1.6 (calc) (ref 1.0–2.5)
ALT: 11 U/L (ref 6–29)
AST: 17 U/L (ref 10–35)
Albumin: 4.1 g/dL (ref 3.6–5.1)
Alkaline phosphatase (APISO): 87 U/L (ref 33–130)
BUN: 7 mg/dL (ref 7–25)
CO2: 27 mmol/L (ref 20–32)
Calcium: 10.3 mg/dL (ref 8.6–10.4)
Chloride: 106 mmol/L (ref 98–110)
Creat: 0.66 mg/dL (ref 0.50–1.05)
Globulin: 2.6 g/dL (calc) (ref 1.9–3.7)
Glucose, Bld: 88 mg/dL (ref 65–99)
Potassium: 4.2 mmol/L (ref 3.5–5.3)
Sodium: 140 mmol/L (ref 135–146)
Total Bilirubin: 0.5 mg/dL (ref 0.2–1.2)
Total Protein: 6.7 g/dL (ref 6.1–8.1)

## 2017-09-13 LAB — T3, FREE: T3, Free: 4.8 pg/mL — ABNORMAL HIGH (ref 2.3–4.2)

## 2017-09-13 LAB — TSH: TSH: 1.15 mIU/L

## 2017-09-13 LAB — CBC
HCT: 43.2 % (ref 35.0–45.0)
Hemoglobin: 14.7 g/dL (ref 11.7–15.5)
MCH: 29.3 pg (ref 27.0–33.0)
MCHC: 34 g/dL (ref 32.0–36.0)
MCV: 86.2 fL (ref 80.0–100.0)
MPV: 10.5 fL (ref 7.5–12.5)
Platelets: 255 10*3/uL (ref 140–400)
RBC: 5.01 10*6/uL (ref 3.80–5.10)
RDW: 12.9 % (ref 11.0–15.0)
WBC: 7.7 10*3/uL (ref 3.8–10.8)

## 2017-09-13 LAB — T4, FREE: Free T4: 1.3 ng/dL (ref 0.8–1.8)

## 2017-09-14 ENCOUNTER — Ambulatory Visit (INDEPENDENT_AMBULATORY_CARE_PROVIDER_SITE_OTHER): Payer: BLUE CROSS/BLUE SHIELD | Admitting: Sports Medicine

## 2017-09-14 VITALS — BP 113/79 | HR 79 | Ht 65.0 in | Wt 164.0 lb

## 2017-09-14 DIAGNOSIS — E039 Hypothyroidism, unspecified: Secondary | ICD-10-CM | POA: Diagnosis not present

## 2017-09-14 DIAGNOSIS — Z23 Encounter for immunization: Secondary | ICD-10-CM | POA: Diagnosis not present

## 2017-09-14 DIAGNOSIS — Z0000 Encounter for general adult medical examination without abnormal findings: Secondary | ICD-10-CM | POA: Diagnosis not present

## 2017-09-14 DIAGNOSIS — E785 Hyperlipidemia, unspecified: Secondary | ICD-10-CM | POA: Diagnosis not present

## 2017-09-14 MED ORDER — LEVOTHYROXINE SODIUM 75 MCG PO TABS: 75.0000 ug | ORAL_TABLET | Freq: Every day | ORAL | 1 refills | Status: DC

## 2017-09-14 MED ORDER — LIOTHYRONINE SODIUM 5 MCG PO TABS: ORAL_TABLET | ORAL | 1 refills | Status: DC

## 2017-09-14 NOTE — Assessment & Plan Note (Signed)
TFTs were okay, no changes.

## 2017-09-14 NOTE — Assessment & Plan Note (Addendum)
Patient would like to try 3 months of diet and exercise before considering a low-dose statin.

## 2017-09-14 NOTE — Assessment & Plan Note (Addendum)
Annual physical as above, due for Tdap, mammogram and Pap smear were done in April and both normal.

## 2017-09-14 NOTE — Progress Notes (Signed)
Subjective:    CC: Annual physical  HPI:  Stephanie Chase is here for her physical, she has no complaints.  I reviewed the past medical history, family history, social history, surgical history, and allergies today and no changes were needed.  Please see the problem list section below in epic for further details.  Past Medical History: Past Medical History:  Diagnosis Date  . Lupus   . Thyroid disease    Past Surgical History: Past Surgical History:  Procedure Laterality Date  . BREAST LUMPECTOMY    . TONSILLECTOMY    . WISDOM TOOTH EXTRACTION     Social History: Social History   Socioeconomic History  . Marital status: Divorced    Spouse name: Not on file  . Number of children: Not on file  . Years of education: Not on file  . Highest education level: Not on file  Occupational History  . Not on file  Social Needs  . Financial resource strain: Not on file  . Food insecurity:    Worry: Not on file    Inability: Not on file  . Transportation needs:    Medical: Not on file    Non-medical: Not on file  Tobacco Use  . Smoking status: Current Every Day Smoker    Packs/day: 1.00    Types: Cigarettes  . Smokeless tobacco: Never Used  Substance and Sexual Activity  . Alcohol use: Yes    Comment: once a month  . Drug use: No  . Sexual activity: Yes    Partners: Male    Comment: BTL   Lifestyle  . Physical activity:    Days per week: Not on file    Minutes per session: Not on file  . Stress: Not on file  Relationships  . Social connections:    Talks on phone: Not on file    Gets together: Not on file    Attends religious service: Not on file    Active member of club or organization: Not on file    Attends meetings of clubs or organizations: Not on file    Relationship status: Not on file  Other Topics Concern  . Not on file  Social History Narrative  . Not on file   Family History: Family History  Problem Relation Age of Onset  . Breast cancer Mother   .  Diabetes Mother   . Heart attack Father   . Hyperlipidemia Father   . Hypertension Father   . Alzheimer's disease Father   . Alzheimer's disease Unknown    Allergies: No Known Allergies Medications: See med rec.  Review of Systems: No headache, visual changes, nausea, vomiting, diarrhea, constipation, dizziness, abdominal pain, skin rash, fevers, chills, night sweats, swollen lymph nodes, weight loss, chest pain, body aches, joint swelling, muscle aches, shortness of breath, mood changes, visual or auditory hallucinations.  Objective:    General: Well Developed, well nourished, and in no acute distress.  Neuro: Alert and oriented x3, extra-ocular muscles intact, sensation grossly intact. Cranial nerves II through XII are intact, motor, sensory, and coordinative functions are all intact. HEENT: Normocephalic, atraumatic, pupils equal round reactive to light, neck supple, no masses, no lymphadenopathy, thyroid nonpalpable. Oropharynx, nasopharynx, external ear canals are unremarkable. Skin: Warm and dry, no rashes noted.  Cardiac: Regular rate and rhythm, no murmurs rubs or gallops.  Respiratory: Clear to auscultation bilaterally. Not using accessory muscles, speaking in full sentences.  Abdominal: Soft, nontender, nondistended, positive bowel sounds, no masses, no organomegaly.  Musculoskeletal: Shoulder,  elbow, wrist, hip, knee, ankle stable, and with full range of motion.  Impression and Recommendations:    The patient was counselled, risk factors were discussed, anticipatory guidance given.  Annual physical exam Annual physical as above, due for Tdap, mammogram and Pap smear were done in April and both normal.  Hyperlipidemia Patient would like to try 3 months of diet and exercise before considering a low-dose statin.  Hypothyroidism TFTs were okay, no changes. ___________________________________________ Gwen Her. Dianah Field, M.D., ABFM., CAQSM. Primary Care and [place] [employer] of [employer] [facility] [employer]

## 2017-09-14 NOTE — Patient Instructions (Signed)
Fat and Cholesterol Restricted Diet High levels of fat and cholesterol in your blood may lead to various health problems, such as diseases of the heart, blood vessels, gallbladder, liver, and pancreas. Fats are concentrated sources of energy that come in various forms. Certain types of fat, including saturated fat, may be harmful in excess. Cholesterol is a substance needed by your body in small amounts. Your body makes all the cholesterol it needs. Excess cholesterol comes from the food you eat. When you have high levels of cholesterol and saturated fat in your blood, health problems can develop because the excess fat and cholesterol will gather along the walls of your blood vessels, causing them to narrow. Choosing the right foods will help you control your intake of fat and cholesterol. This will help keep the levels of these substances in your blood within normal limits and reduce your risk of disease.  What types of fat should I choose?  Choose healthy fats more often. Choose monounsaturated and polyunsaturated fats, such as olive and canola oil, flaxseeds, walnuts, almonds, and seeds.  Eat more omega-3 fats. Good choices include salmon, mackerel, sardines, tuna, flaxseed oil, and ground flaxseeds. Aim to eat fish at least two times a week.  Limit saturated fats. Saturated fats are primarily found in animal products, such as meats, butter, and cream. Plant sources of saturated fats include palm oil, palm kernel oil, and coconut oil.  Avoid foods with partially hydrogenated oils in them. These contain trans fats. Examples of foods that contain trans fats are stick margarine, some tub margarines, cookies, crackers, and other baked goods. What general guidelines do I need to follow? These guidelines for healthy eating will help you control your intake of fat and cholesterol:  Check food labels carefully to identify foods with trans fats or high amounts of saturated fat.  Fill one half of your  plate with vegetables and green salads.  Fill one fourth of your plate with whole grains. Look for the word "whole" as the first word in the ingredient list.  Fill one fourth of your plate with lean protein foods.  Limit fruit to two servings a day. Choose fruit instead of juice.  Eat more foods that contain fiber, such as apples, broccoli, carrots, beans, peas, and barley.  Eat more home-cooked food and less restaurant, buffet, and fast food.  Limit or avoid alcohol.  Limit foods high in starch and sugar.  Limit fried foods.  Cook foods using methods other than frying. Baking, boiling, grilling, and broiling are all great options.  Lose weight if you are overweight. Losing just 5-10% of your initial body weight can help your overall health and prevent diseases such as diabetes and heart disease.  What foods can I eat? Grains  Whole grains, such as whole wheat or whole grain breads, crackers, cereals, and pasta. Unsweetened oatmeal, bulgur, barley, quinoa, or brown rice. Corn or whole wheat flour tortillas. Vegetables  Fresh or frozen vegetables (raw, steamed, roasted, or grilled). Green salads. Fruits  All fresh, canned (in natural juice), or frozen fruits. Meats and other protein foods  Ground beef (85% or leaner), grass-fed beef, or beef trimmed of fat. Skinless chicken or [place]. Ground chicken or [place]. Pork trimmed of fat. All fish and seafood. Eggs. Dried beans, peas, or lentils. Unsalted nuts or seeds. Unsalted canned or dry beans. Dairy  Low-fat dairy products, such as skim or 1% milk, 2% or reduced-fat cheeses, low-fat ricotta or cottage cheese, or plain low-fat yo Fats and  oils  Tub margarines without trans fats. Light or reduced-fat mayonnaise and salad dressings. Avocado. Olive, canola, sesame, or safflower oils. Natural peanut or almond butter (choose ones without added sugar and oil). The items listed above may not be a complete list of recommended foods or  beverages. Contact your dietitian for more options. Foods to avoid Grains  White bread. White pasta. White rice. Cornbread. Bagels, pastries, and croissants. Crackers that contain trans fat. Vegetables  White potatoes. Corn. Creamed or fried vegetables. Vegetables in a cheese sauce. Fruits  Dried fruits. Canned fruit in light or heavy syrup. Fruit juice. Meats and other protein foods  Fatty cuts of meat. Ribs, chicken wings, bacon, sausage, bologna, salami, chitterlings, fatback, hot dogs, bratwurst, and packaged luncheon meats. Liver and organ meats. Dairy  Whole or 2% milk, cream, half-and-half, and cream cheese. Whole milk cheeses. Whole-fat or sweetened yogurt. Full-fat cheeses. Nondairy creamers and whipped toppings. Processed cheese, cheese spreads, or cheese curds. Beverages  Alcohol. Sweetened drinks (such as sodas, lemonade, and fruit drinks or punches). Fats and oils  Butter, stick margarine, lard, shortening, ghee, or bacon fat. Coconut, palm kernel, or palm oils. Sweets and desserts  Corn syrup, sugars, honey, and molasses. Candy. Jam and jelly. Syrup. Sweetened cereals. Cookies, pies, cakes, donuts, muffins, and ice cream. The items listed above may not be a complete list of foods and beverages to avoid. Contact your dietitian for more information. This information is not intended to replace advice given to you by your health care provider. Make sure you discuss any questions you have with your health care provider. Document Released: 03/13/2005 Document Revised: 04/03/2014 Document Reviewed: 06/11/2013 Elsevier Interactive Patient Education  [employer].

## 2017-11-20 ENCOUNTER — Encounter: Payer: Self-pay | Admitting: Sports Medicine

## 2017-11-20 DIAGNOSIS — E039 Hypothyroidism, unspecified: Secondary | ICD-10-CM

## 2017-11-20 MED ORDER — LEVOTHYROXINE SODIUM 75 MCG PO TABS: 75.0000 ug | ORAL_TABLET | Freq: Every day | ORAL | 1 refills | Status: DC

## 2017-11-30 ENCOUNTER — Encounter: Payer: Self-pay | Admitting: Sports Medicine

## 2018-01-10 ENCOUNTER — Encounter: Payer: Self-pay | Admitting: Sports Medicine

## 2018-03-06 ENCOUNTER — Other Ambulatory Visit: Payer: Self-pay | Admitting: Sports Medicine

## 2018-03-06 DIAGNOSIS — E039 Hypothyroidism, unspecified: Secondary | ICD-10-CM

## 2018-06-13 ENCOUNTER — Other Ambulatory Visit: Payer: Self-pay | Admitting: Obstetrics and Gynecology

## 2019-07-04 ENCOUNTER — Other Ambulatory Visit: Payer: Self-pay | Admitting: Obstetrics and Gynecology

## 2019-07-04 DIAGNOSIS — N6489 Other specified disorders of breast: Secondary | ICD-10-CM

## 2019-07-04 DIAGNOSIS — R921 Mammographic calcification found on diagnostic imaging of breast: Secondary | ICD-10-CM

## 2019-07-08 ENCOUNTER — Other Ambulatory Visit: Payer: Self-pay | Admitting: Obstetrics and Gynecology

## 2019-07-08 DIAGNOSIS — R921 Mammographic calcification found on diagnostic imaging of breast: Secondary | ICD-10-CM

## 2019-07-08 DIAGNOSIS — N6489 Other specified disorders of breast: Secondary | ICD-10-CM

## 2019-07-15 ENCOUNTER — Other Ambulatory Visit: Payer: Self-pay | Admitting: Obstetrics and Gynecology

## 2019-07-17 ENCOUNTER — Other Ambulatory Visit: Payer: BLUE CROSS/BLUE SHIELD

## 2019-07-18 ENCOUNTER — Ambulatory Visit
Admission: RE | Admit: 2019-07-18 | Discharge: 2019-07-18 | Disposition: A | Discharge status: Home / Self Care | Payer: BC Managed Care – PPO | Source: Ambulatory Visit | Attending: Obstetrics and Gynecology | Admitting: Obstetrics and Gynecology

## 2019-07-18 ENCOUNTER — Ambulatory Visit
Admission: RE | Admit: 2019-07-18 | Discharge: 2019-07-18 | Disposition: A | Discharge status: Home / Self Care | Payer: BLUE CROSS/BLUE SHIELD | Source: Ambulatory Visit | Attending: Obstetrics and Gynecology | Admitting: Obstetrics and Gynecology

## 2019-07-18 ENCOUNTER — Other Ambulatory Visit: Payer: Self-pay

## 2019-07-18 DIAGNOSIS — R921 Mammographic calcification found on diagnostic imaging of breast: Secondary | ICD-10-CM

## 2019-07-18 DIAGNOSIS — N6489 Other specified disorders of breast: Secondary | ICD-10-CM

## 2019-07-18 DIAGNOSIS — C50919 Malignant neoplasm of unspecified site of unspecified female breast: Secondary | ICD-10-CM | POA: Insufficient documentation

## 2019-07-18 IMAGING — US US  BREAST BX W/ LOC DEV 1ST LESION IMG BX SPEC US GUIDE*R*
1 series · 12 of 12 positions shown · non-contrast
Comparison: Previous exam(s).
COMPARISON: Previous exam(s).

Addendum:
CLINICAL DATA: Patient with an indeterminate mass in the RIGHT
breast at the 12 o'clock axis presents today for ultrasound-guided
core biopsy.

Patient has additional indeterminate calcifications within the upper
RIGHT breast for which a stereotactic biopsy will be performed later
today.
EXAM:
ULTRASOUND GUIDED RIGHT BREAST CORE NEEDLE BIOPSY

[Series 1: us breast bx w/ loc dev 1st lesion img bx spec us  · 0.06mm/px · 12 of 12 slices shown]
[im 1/12]
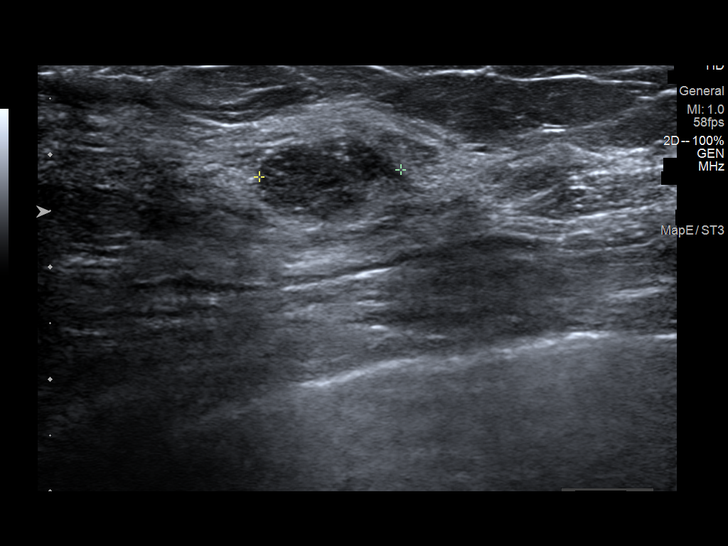
[im 2/12]
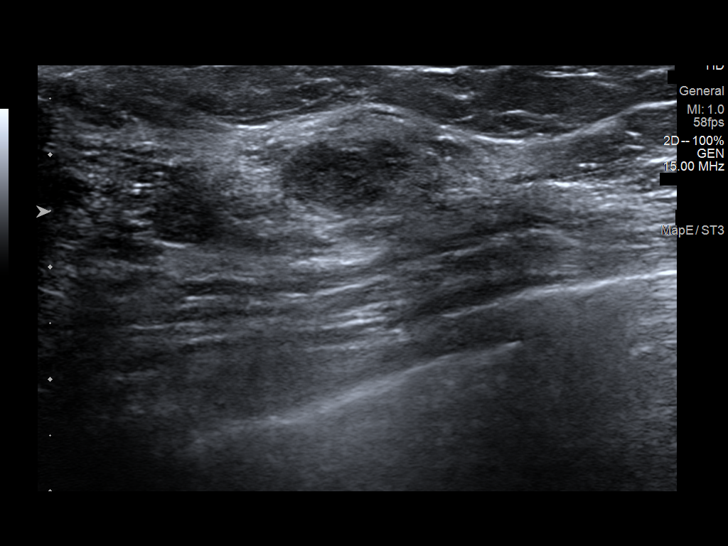
[im 3/12]
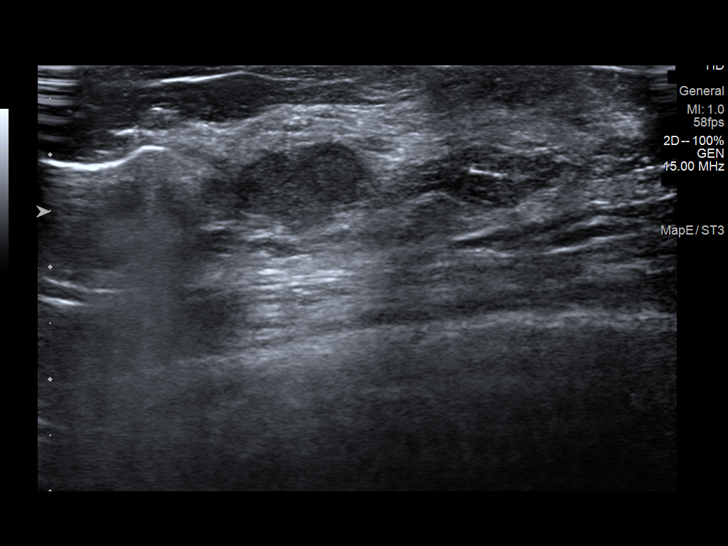
[im 4/12]
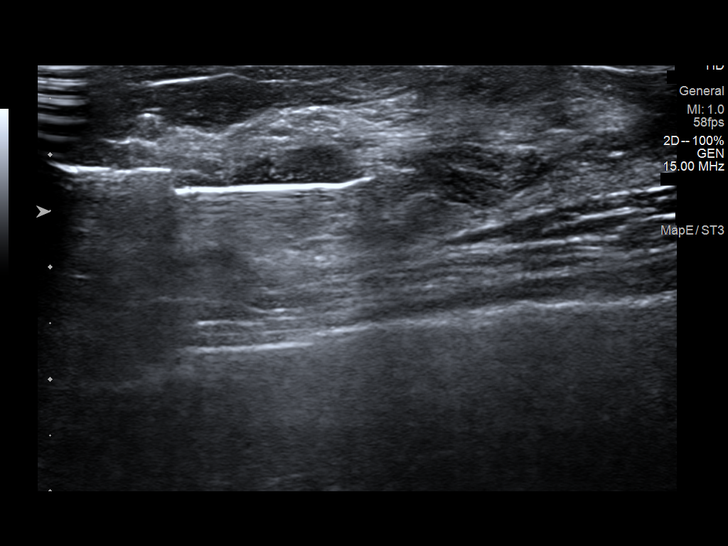
[im 5/12]
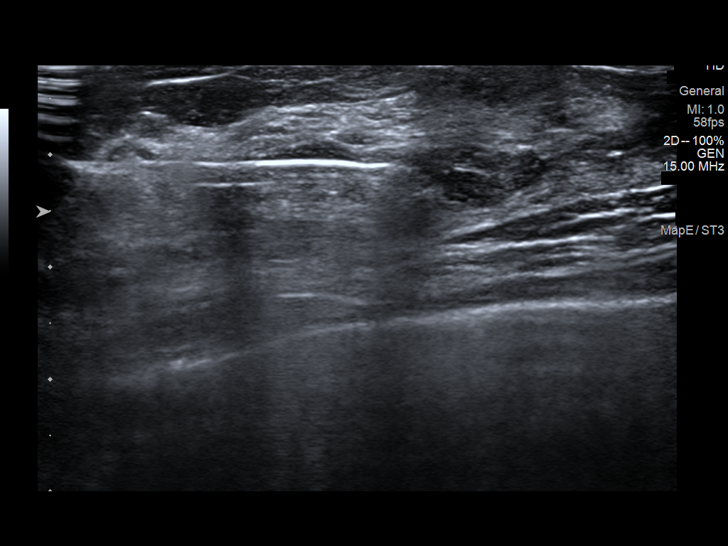
[im 6/12]
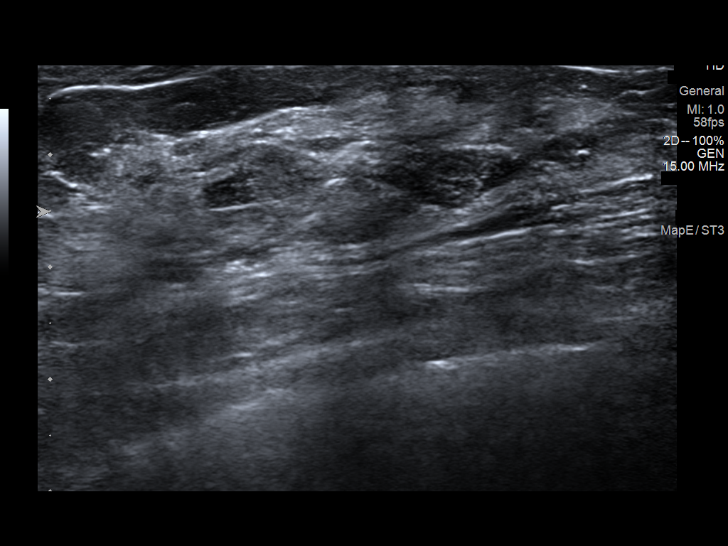
[im 7/12]
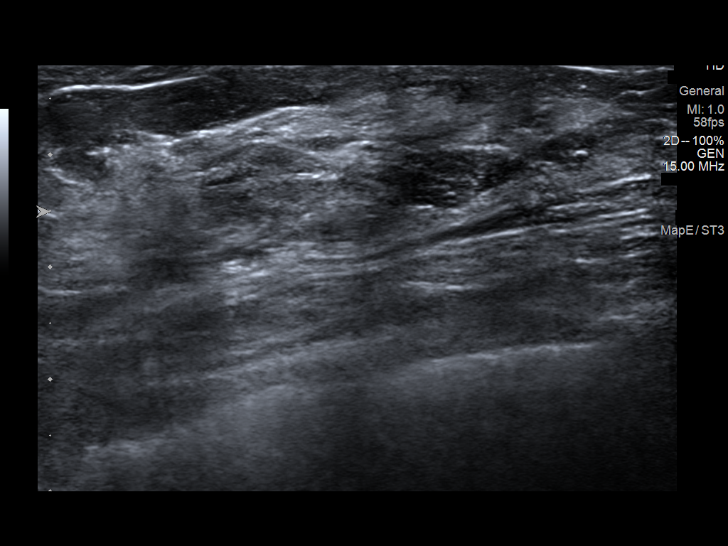
[im 8/12]
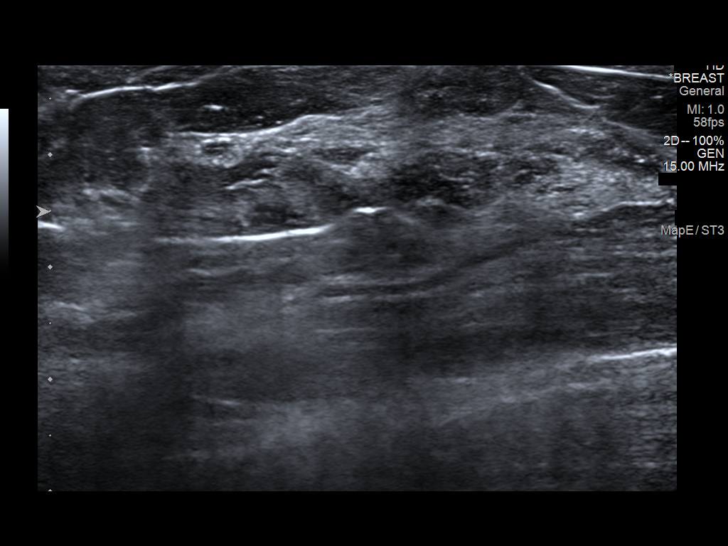
[im 9/12]
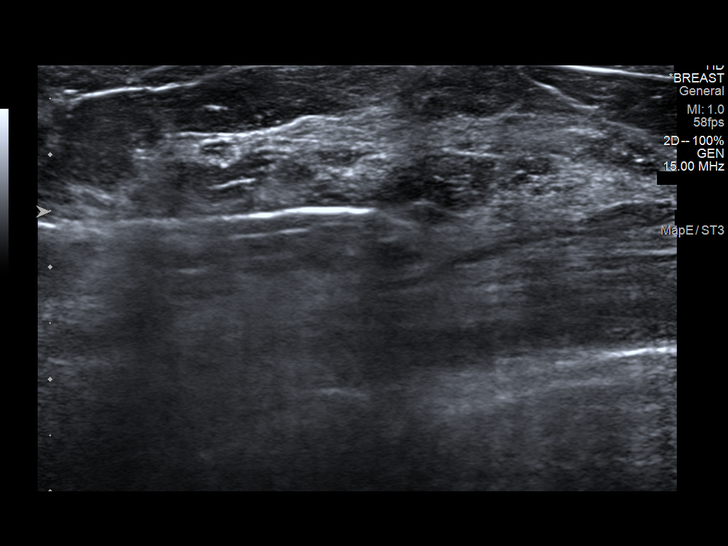
[im 10/12]
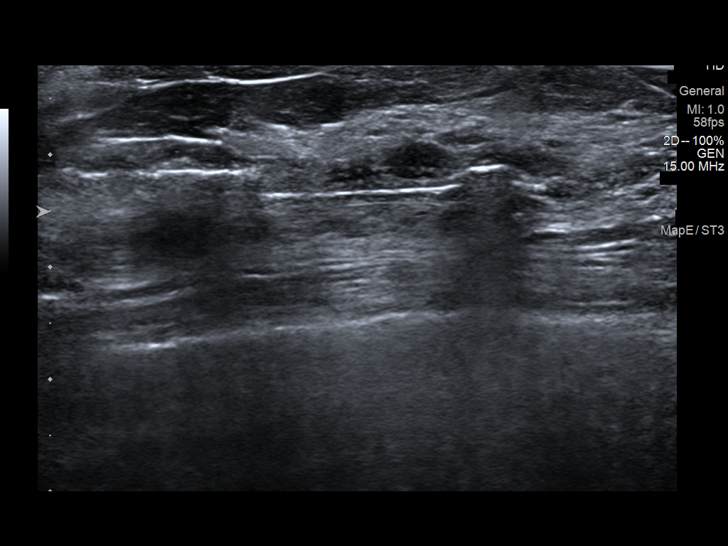
[im 11/12]
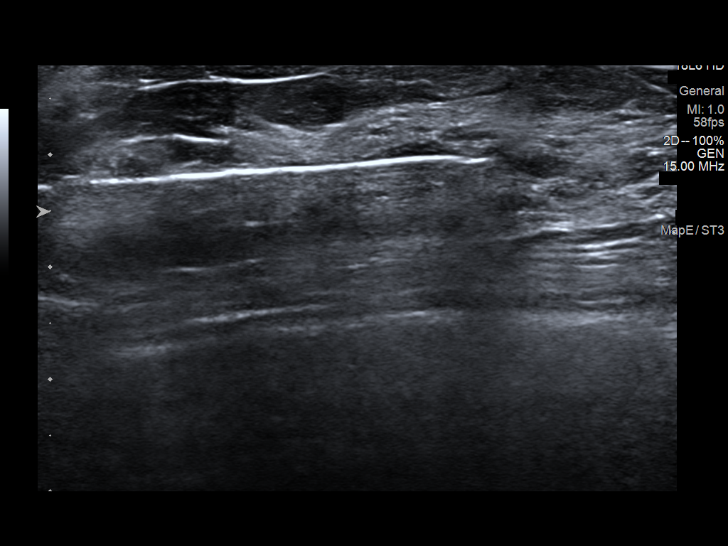
[im 12/12]
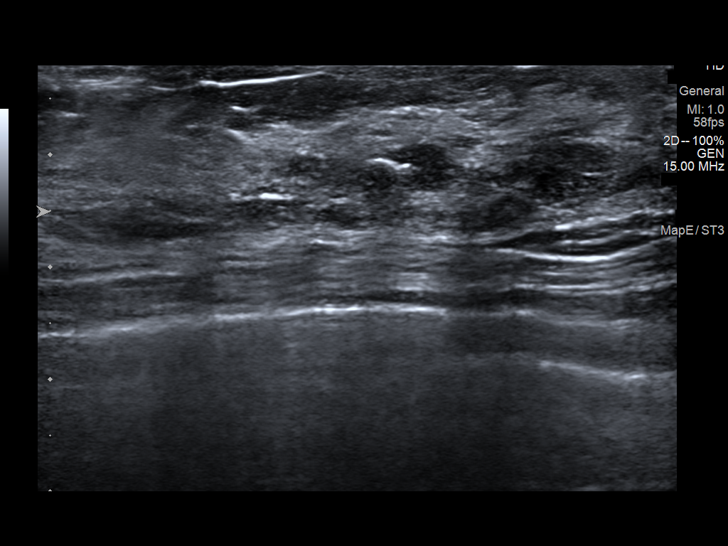

[12 of 12 positions shown; findings below may reference images not displayed]



Lesion quadrant: 12 o'clock

Using sterile technique and 1% Lidocaine as local anesthetic, under
direct ultrasound visualization, a 12 gauge BEIGER device was
used to perform biopsy of the RIGHT breast mass at the 12 o'clock
axis using a medial approach. At the conclusion of the procedure
ribbon shaped tissue marker clip was deployed into the biopsy
cavity. Follow up 2 view mammogram was performed and dictated
separately.
IMPRESSION: Ultrasound guided biopsy of the RIGHT breast mass at the 12 o'clock
axis. No apparent complications.

ADDENDUM:
Pathology revealed GRADE III INVASIVE DUCTAL CARCINOMA of the RIGHT
breast, 12 o'clock. This was found to be concordant by Dr. BEIGER
BEIGER.

Pathology revealed DUCTAL CARCINOMA IN SITU of the RIGHT breast,
upper outer quadrant. This was found to be concordant by Dr. BEIGER
BEIGER.

Pathology results were discussed with the patient by telephone. The
patient reported doing well after the biopsies with tenderness at
the sites. Post biopsy instructions and care were reviewed and
questions were answered. The patient was encouraged to call The

Per patient request, the patient was referred to [REDACTED] [REDACTED] at [REDACTED] Cancer

Pathology results reported by BEIGER RN on [DATE].



Lesion quadrant: 12 o'clock

Using sterile technique and 1% Lidocaine as local anesthetic, under
direct ultrasound visualization, a 12 gauge BEIGER device was
used to perform biopsy of the RIGHT breast mass at the 12 o'clock
axis using a medial approach. At the conclusion of the procedure
ribbon shaped tissue marker clip was deployed into the biopsy
cavity. Follow up 2 view mammogram was performed and dictated
separately.
IMPRESSION: Ultrasound guided biopsy of the RIGHT breast mass at the 12 o'clock
axis. No apparent complications.

## 2019-07-18 IMAGING — MG MM BREAST BX W/ LOC DEV 1ST LESION IMAGE BX SPEC STEREO GUIDE*R*
8 of 14 series · 8 of 18 positions shown · non-contrast
Comparison: Previous exams.
COMPARISON: Previous exams.

Addendum:
CLINICAL DATA: Patient with indeterminate calcifications in the
upper-outer quadrant the RIGHT breast presents today for
stereotactic biopsy.

EXAM:
RIGHT BREAST STEREOTACTIC CORE NEEDLE BIOPSY

[R (1 of 8)]
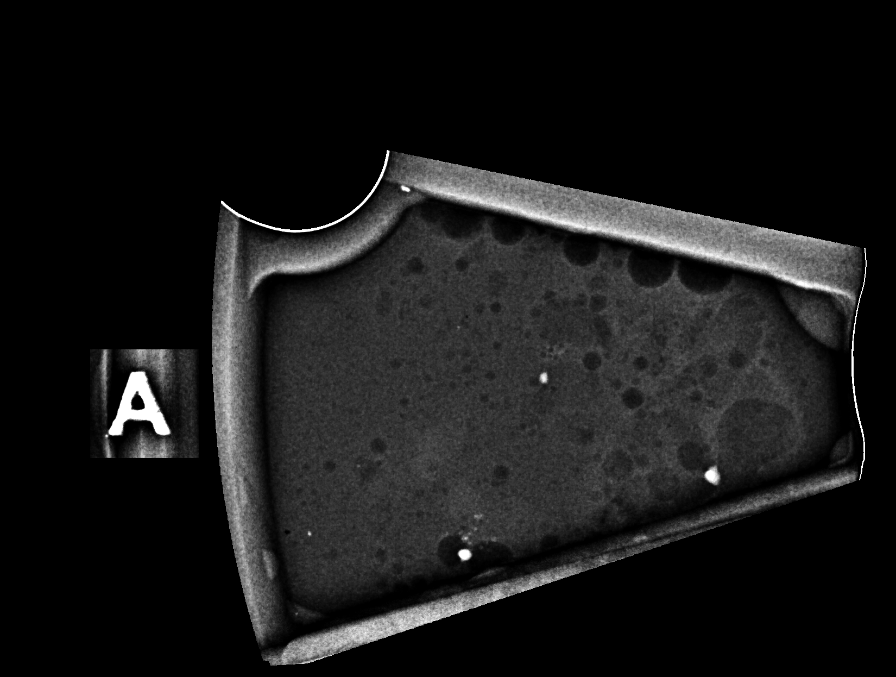

[R (2 of 8)]
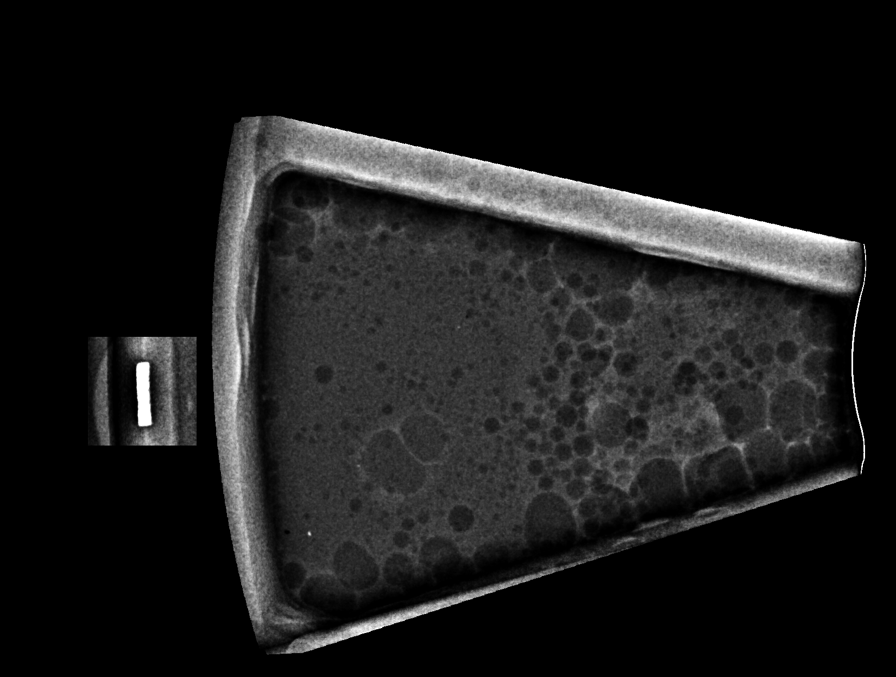

[R (3 of 8)]
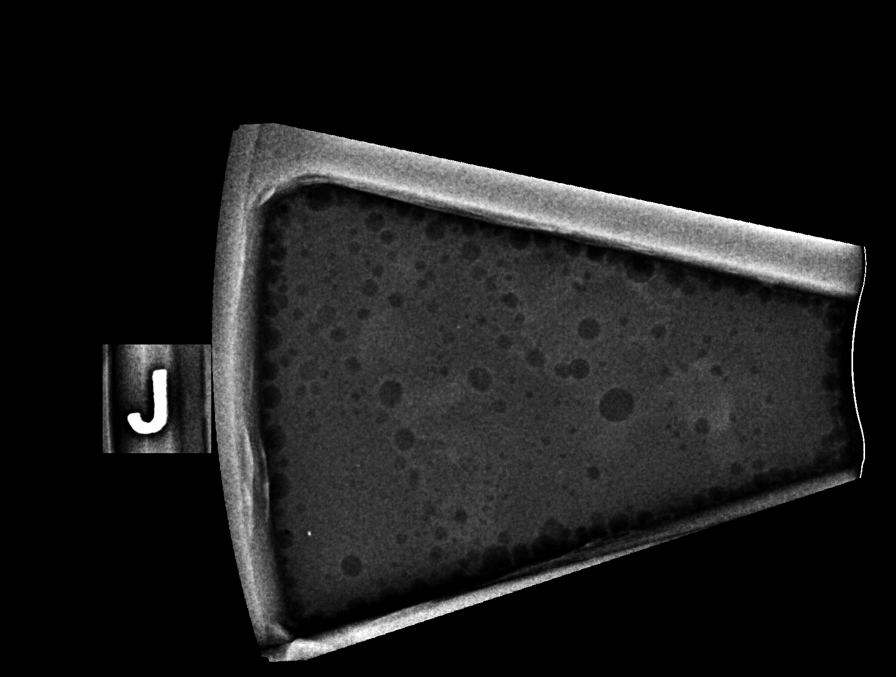

[R (4 of 8)]
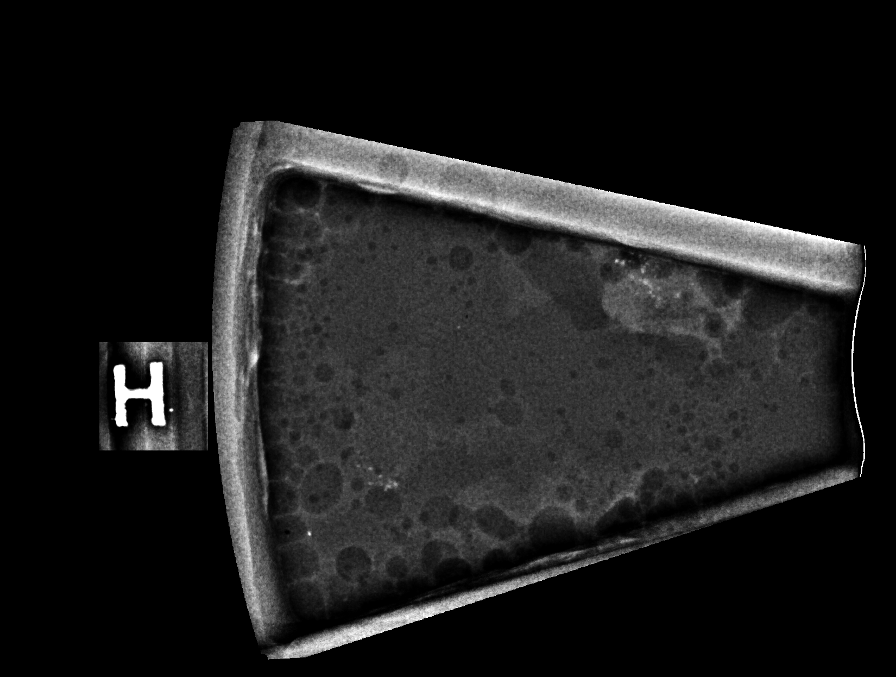

[R (5 of 8)]
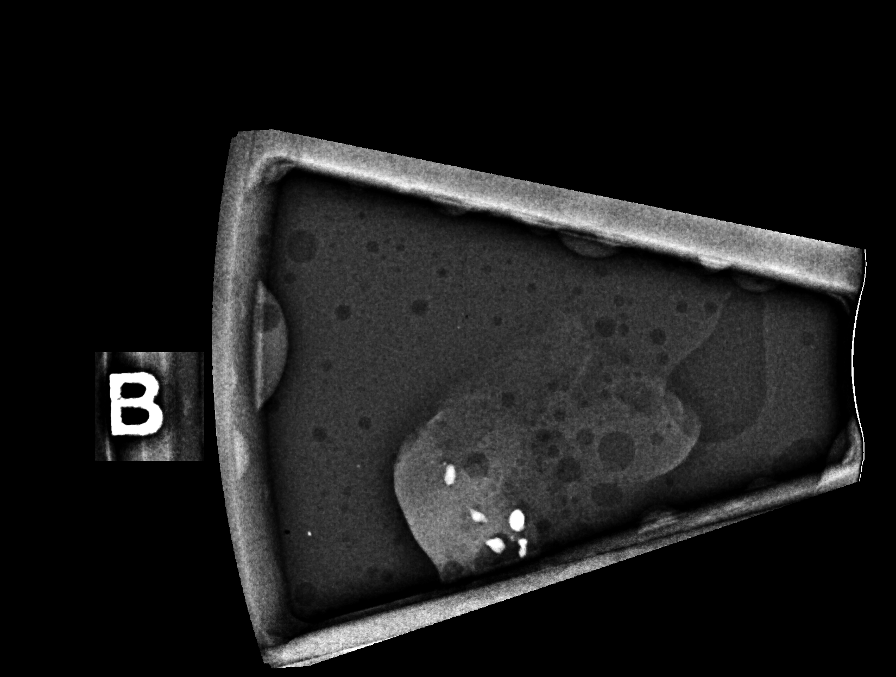

[R (6 of 8)]
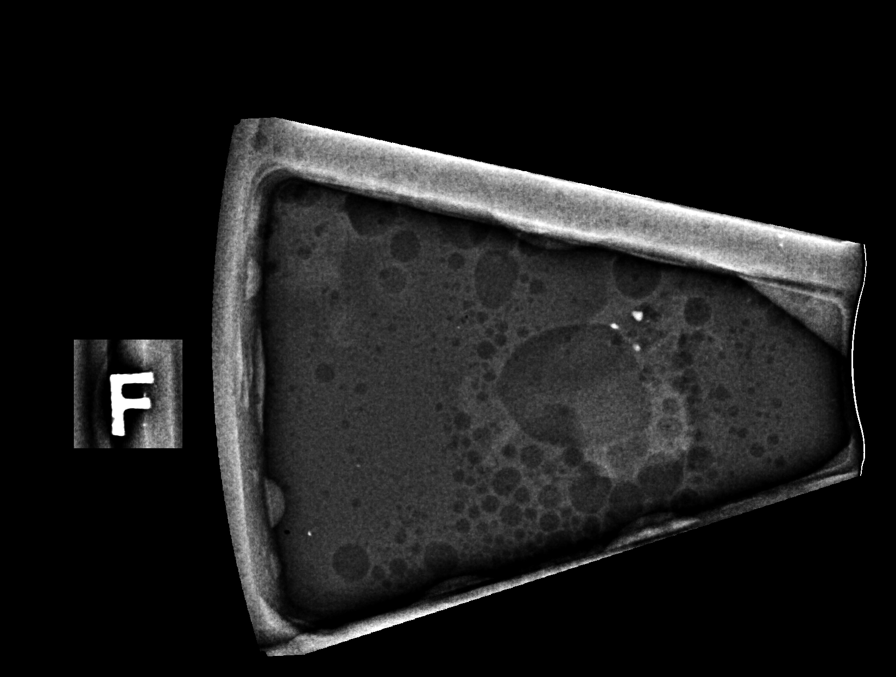

[R (7 of 8)]
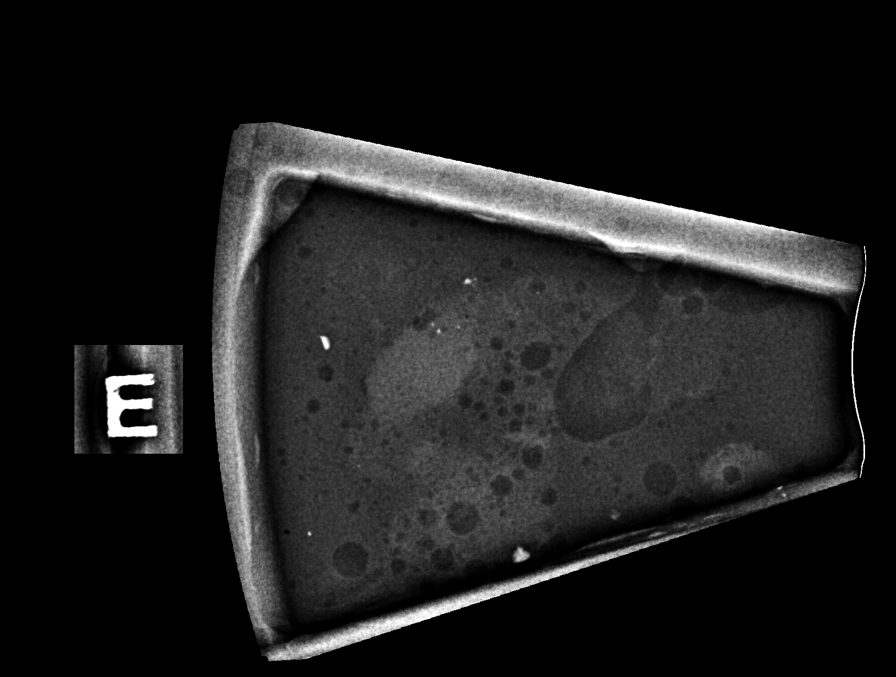

[R (8 of 8)]
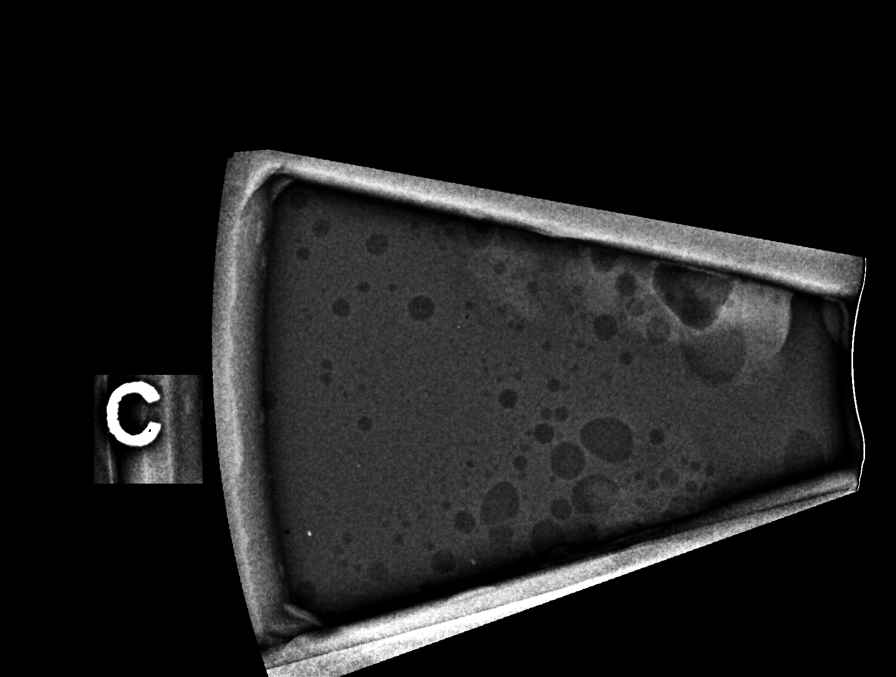

[8 of 18 positions shown; findings below may reference images not displayed]



Using sterile technique and 1% Lidocaine as local anesthetic, under
stereotactic guidance, a 9 gauge vacuum assisted device was used to
perform core needle biopsy of calcifications in the upper outer
quadrant of the RIGHT breast using a superior approach. Specimen
radiograph was performed showing calcifications. Specimens with
calcifications are identified for pathology.

Lesion quadrant: Upper-outer quadrant

At the conclusion of the procedure, coil shaped tissue marker clip
was deployed into the biopsy cavity. Follow-up 2-view mammogram was
performed and dictated separately.
IMPRESSION: Stereotactic-guided biopsy of indeterminate calcifications within
the upper-outer quadrant of the RIGHT breast. No apparent
complications.

ADDENDUM:
Pathology revealed GRADE III INVASIVE DUCTAL CARCINOMA of the RIGHT
breast, 12 o'clock. This was found to be concordant by Dr. RENALDO
RENALDO.

Pathology revealed DUCTAL CARCINOMA IN SITU of the RIGHT breast,
upper outer quadrant. This was found to be concordant by Dr. RENALDO
RENALDO.

Pathology results were discussed with the patient by telephone. The
patient reported doing well after the biopsies with tenderness at
the sites. Post biopsy instructions and care were reviewed and
questions were answered. The patient was encouraged to call The

Per patient request, the patient was referred to [REDACTED] [REDACTED] at [REDACTED] Cancer

Pathology results reported by RENALDO RN on [DATE].



Using sterile technique and 1% Lidocaine as local anesthetic, under
stereotactic guidance, a 9 gauge vacuum assisted device was used to
perform core needle biopsy of calcifications in the upper outer
quadrant of the RIGHT breast using a superior approach. Specimen
radiograph was performed showing calcifications. Specimens with
calcifications are identified for pathology.

Lesion quadrant: Upper-outer quadrant

At the conclusion of the procedure, coil shaped tissue marker clip
was deployed into the biopsy cavity. Follow-up 2-view mammogram was
performed and dictated separately.
IMPRESSION: Stereotactic-guided biopsy of indeterminate calcifications within
the upper-outer quadrant of the RIGHT breast. No apparent
complications.

## 2019-07-18 IMAGING — MG MM BREAST LOCALIZATION CLIP
4 series · 4 of 12 positions shown · non-contrast
Comparison: Outside studies including diagnostic mammogram and
ultrasound dated [DATE].

CLINICAL DATA: Status post ultrasound biopsy of a RIGHT breast mass
and stereotactic biopsy of RIGHT breast calcifications.

EXAM:
DIAGNOSTIC RIGHT MAMMOGRAM POST ULTRASOUND and STEREOTACTIC BIOPSY

[R CC synth-2D]
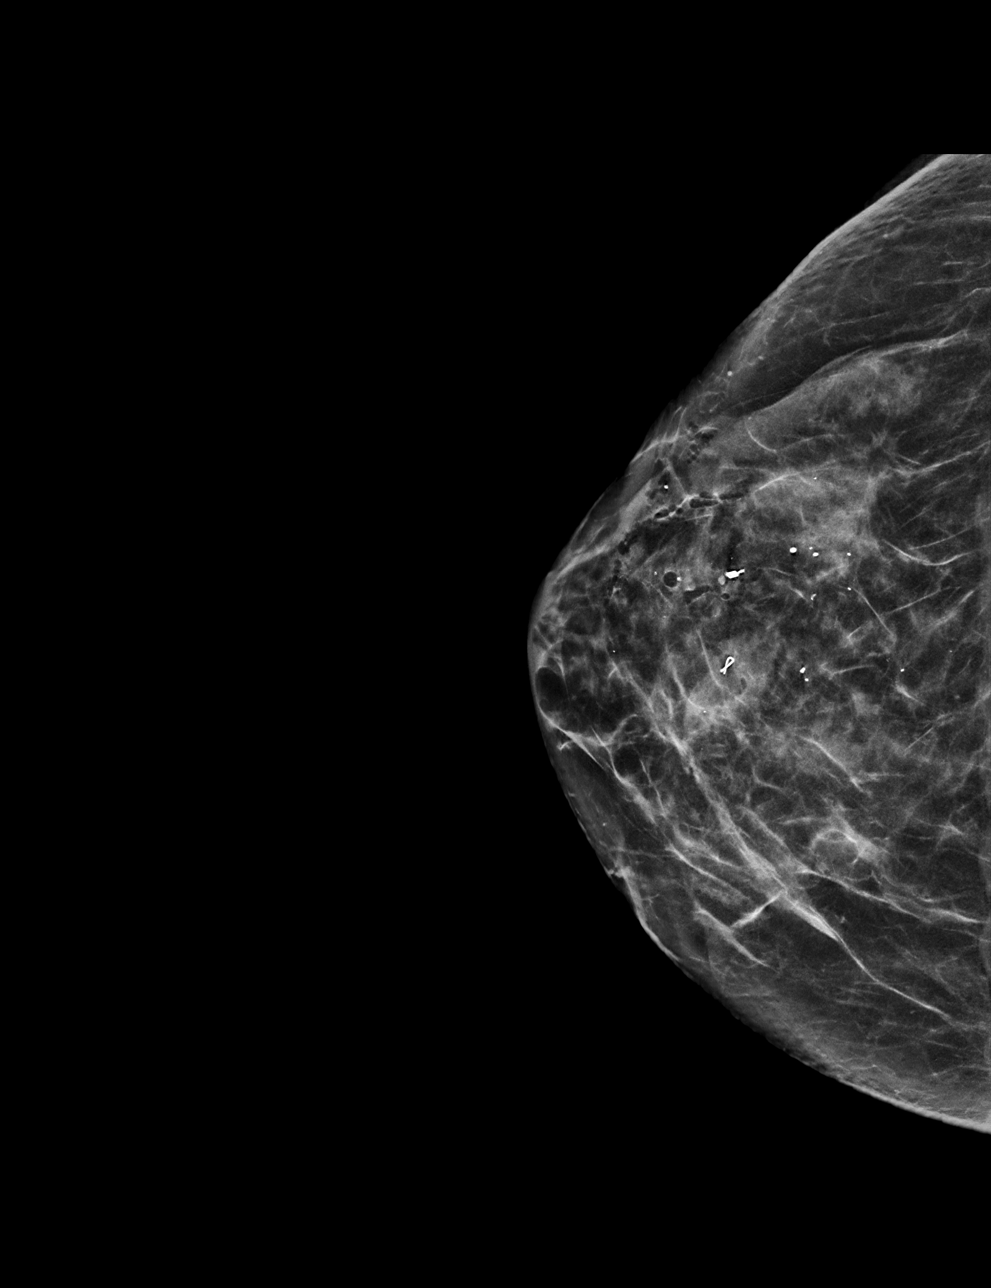

[R ML synth-2D]
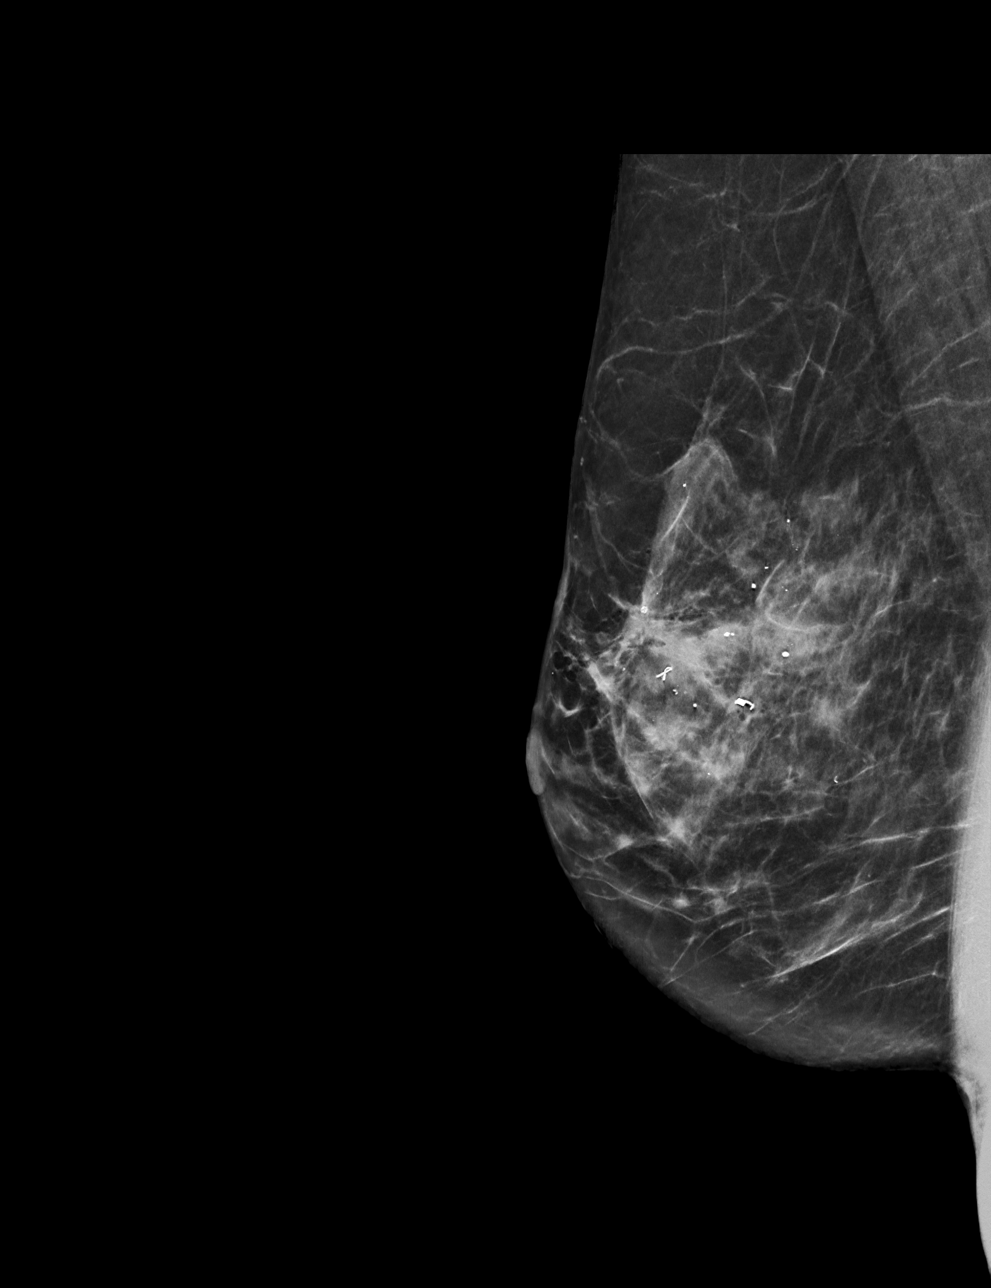

[R CC tomo · tomo slice 27/54.0]
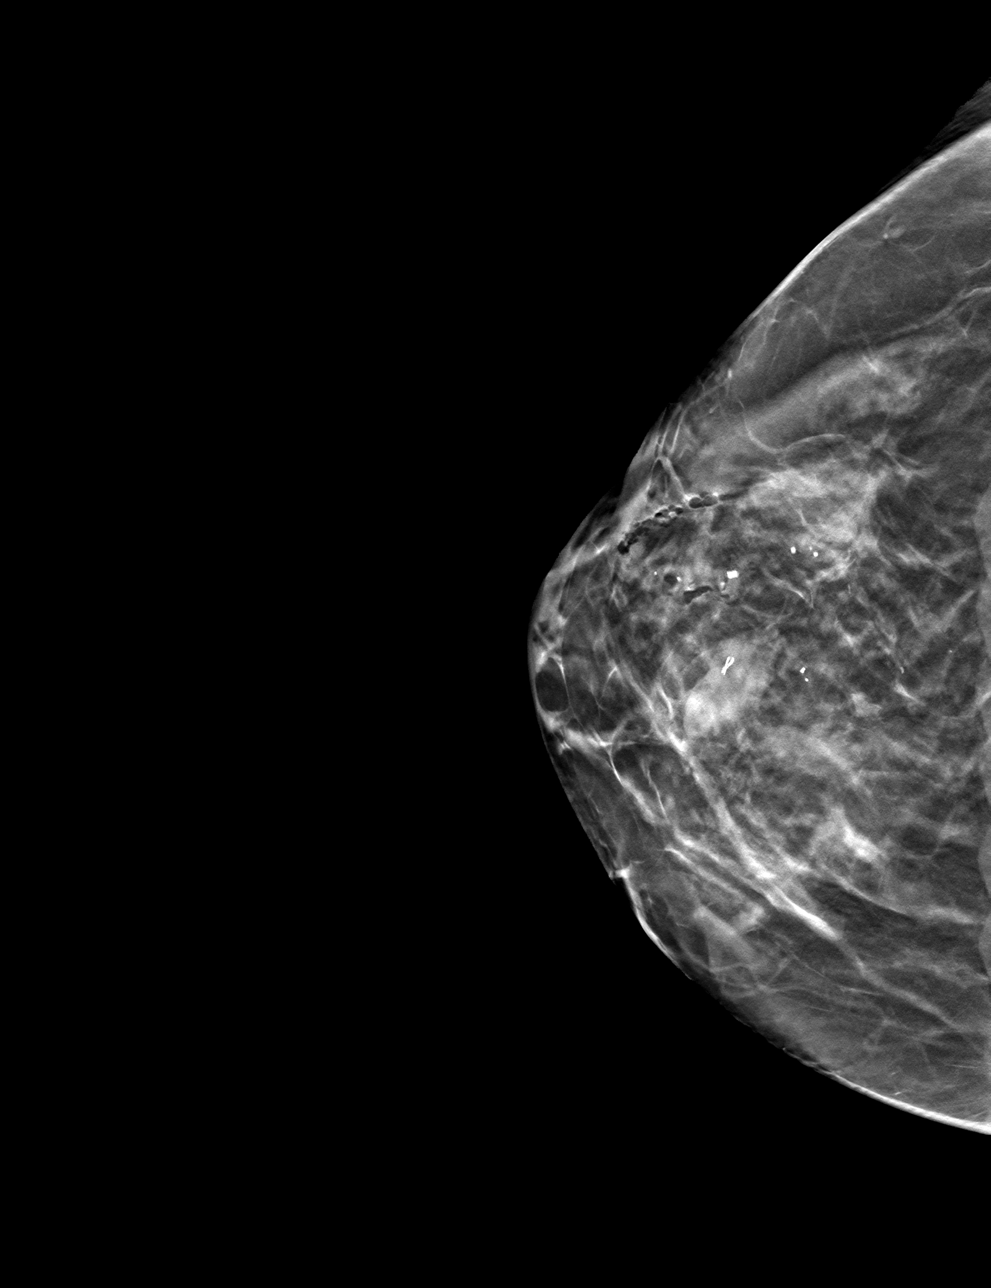

[R ML tomo · tomo slice 28/55.0]
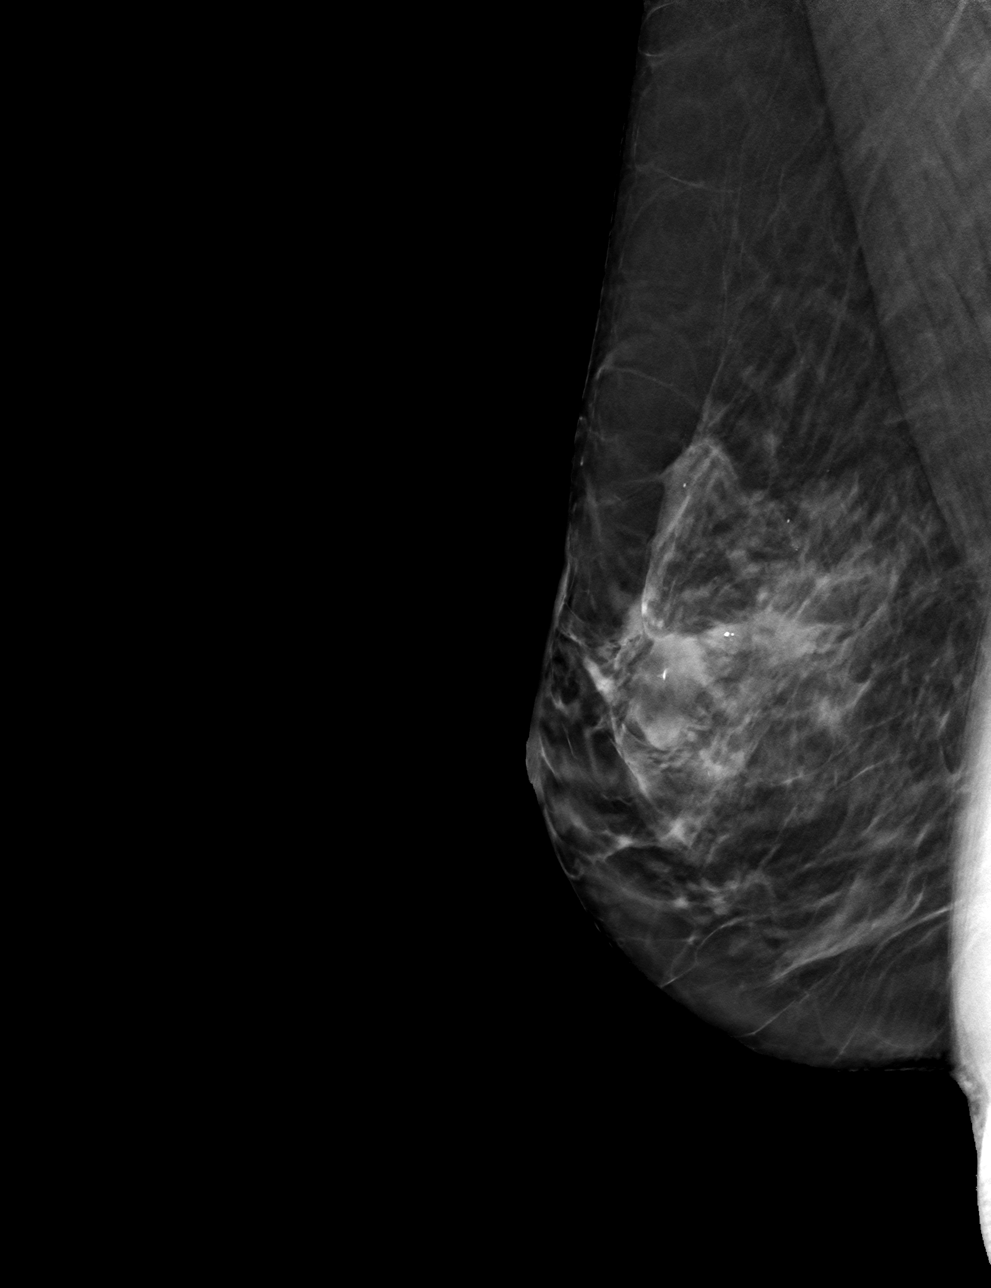

[4 of 12 positions shown; findings below may reference images not displayed]

FINDINGS: Mammographic images were obtained following ultrasound guided biopsy
of an indeterminate mass in the RIGHT breast at the 12 o'clock axis
followed by stereotactic biopsy of indeterminate calcifications in
the upper-outer quadrant of the RIGHT breast. The ribbon shaped clip
is appropriately positioned at the expected site of the biopsy.

The coil shaped biopsy clip is displaced 1.3 cm posterior-inferior
to the biopsied calcifications.
IMPRESSION: 1. Appropriate positioning of the ribbon shaped biopsy marking clip
at the site of biopsy in the RIGHT breast at the 12 o'clock axis.
2. The coil shaped biopsy marking clip is displaced 1.3 cm
posterior-inferior to the biopsied calcifications.

Final Assessment: Post Procedure Mammograms for Marker Placement

## 2019-07-21 ENCOUNTER — Other Ambulatory Visit: Payer: BLUE CROSS/BLUE SHIELD

## 2019-07-23 ENCOUNTER — Telehealth: Payer: Self-pay | Admitting: Hematology

## 2019-07-23 ENCOUNTER — Encounter: Payer: Self-pay | Admitting: *Deleted

## 2019-07-23 NOTE — Telephone Encounter (Signed)
Spoke with patient to confirm afternoon [facility] appointment for 5/5, packet will be emailed to patient

## 2019-07-24 ENCOUNTER — Other Ambulatory Visit: Payer: Self-pay | Admitting: *Deleted

## 2019-07-24 DIAGNOSIS — Z170 Estrogen receptor positive status [ER+]: Secondary | ICD-10-CM

## 2019-07-24 DIAGNOSIS — C50411 Malignant neoplasm of upper-outer quadrant of right female breast: Secondary | ICD-10-CM

## 2019-07-24 HISTORY — DX: Estrogen receptor positive status (ER+): Z17.0

## 2019-07-28 NOTE — Progress Notes (Signed)
Stephanie Chase   [phone] Fax:([phone]) [place] [facility] Note   Patient Care Team: Janie Morning, DO as PCP - General (Family Medicine) Richrd Prime as Consulting Physician (Obstetrics and Gynecology) Mauro Kaufmann, RN as Oncology Nurse Navigator Rockwell Germany, RN as Oncology Nurse Navigator Stark Klein, MD as Consulting Physician (General Surgery) Truitt Merle, MD as Consulting Physician (Hematology) Kyung Rudd, MD as Consulting Physician (Radiation Oncology) Register, Amber, PA-C as Physician Assistant (Dermatology)  Date of Service:  07/30/2019   CHIEF COMPLAINTS/PURPOSE OF CONSULTATION:  Newly Diagnosed Malignant neoplasm of upper-outer quadrant of right breast   Oncology History Overview Note  Cancer Staging Malignant neoplasm of upper-outer quadrant of right breast in female, estrogen receptor positive ([place]) Staging form: Breast, AJCC 8th Edition - Clinical stage from 07/30/2019: Stage IA (cT1c, cN0, cM0, G3, ER+, PR-, HER2+) - Unsigned    Malignant neoplasm of upper-outer quadrant of right breast in female, estrogen receptor positive ([place])  07/03/2019 Mammogram   Diagnostic Mammogram  IMPRESSION There is a 1.3x1.1cm focal asymmetry in the retroareolar anterior to middle depth right breast 2.2cm from the nipple.  There is a 2 cm focal asymmetry with appearance in the upper outer quadrant posterior depth of right breast 3 cm from nipple -Both suspicious and biopsy recommended.     07/18/2019 Initial Biopsy   Diagnosis 07/18/19 1. Breast, right, needle core biopsy, 12 o'clock - INVASIVE DUCTAL CARCINOMA. 2. Breast, right, needle core biopsy, upper outer quadrant - DUCTAL CARCINOMA IN SITU. Microscopic Comment 1. The invasive carcinoma is nuclear grade 3. The greatest linear extent of tumor in any one core is 11 mm. Ancillary studies will be reported separately. CKAE1AE3, GATA-3, ER and E-cadherin are positive. PAX 8 is negative. CK7  is noncontributory. 2. The in situ carcinoma is high nuclear grade with central necrosis and calcifications. E-cadherin is positive. P63, Calponin and SMM-1 demonstrate the present of myoepithelium.   07/18/2019 Receptors her2   1. PROGNOSTIC INDICATORS 07/18/19 Results: IMMUNOHISTOCHEMICAL AND MORPHOMETRIC ANALYSIS PERFORMED MANUALLY The tumor cells are POSITIVE for Her2 (3+). Of note, the tumor shows heterogeneity in regards to Her2 expression. Estrogen Receptor: 95%, POSITIVE, STRONG STAINING INTENSITY Progesterone Receptor: 0%, NEGATIVE Proliferation Marker Ki67: 50%   07/24/2019 Initial Diagnosis   Malignant neoplasm of upper-outer quadrant of right breast in female, estrogen receptor positive ([place])      HISTORY OF PRESENTING ILLNESS:  Stephanie Chase 59 y.o. female is a here because of newly diagnosed right breast cancer. The patient presents to the Breast clinic today accompanied by her 2 daughters.   Her mas was found by screening mammogram. She has had yearly mammogram and she had 2 b/l benign lumpectomies in 2013 in [place] in [place]. She denies any breast change or weight change. She does note right breast pain at the biopsy sites. She has not other baseline or new body pain except positional b/l superior shoulder pain and has normal ROM. She feels overall at baseline.   Socially she has 2 adults daughters. She drinks alcohol 1-2 times a month. She smokes 1ppd and had cut back to this. She has been smoking for over 50 years. 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 Per her daughter she had anxiety or pani]c attacks when she gets overwhelmed or disappointments. She notes she can sleep well at night so far. Her daughter would like her to take something to help. She notes she has a boyfriend but rather her daughters be  first to contact.   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 She uses steroid cream as needed for her lupus rashes, some  skin lesions of which have been removed. She had uterine ablation in the 10-15 years ago due to heavy periods. In the past 2-3 years she has had hot flashes. I reviewed her medication list. Dr Nevada Crane is her dermatologist.  Her mother had breast cancer in her 98s and bladder cancer, Her Maternal great aunt was breast cancer. She her father had prostate cancer.    GYN HISTORY  Menarchal: 12 LMP: Stopped after Uterine ablation 10-15 years ago.  Contraceptive: 2 years on birth control pills  HRT: No  G2P2 - first at age 89    REVIEW OF SYSTEMS:    Constitutional: Denies fevers, chills or abnormal night sweats Eyes: Denies blurriness of vision, double vision or watery eyes Ears, nose, mouth, throat, and face: Denies mucositis or sore throat Respiratory: Denies cough, dyspnea or wheezes Cardiovascular: Denies palpitation, chest discomfort or lower extremity swelling Gastrointestinal:  Denies nausea, heartburn or change in bowel habits Skin: Denies abnormal skin rashes Lymphatics: Denies new lymphadenopathy or easy bruising Neurological:Denies numbness, tingling or new weaknesses Behavioral/Psych: Mood is stable, no new changes (+) Anxiety All other systems were reviewed with the patient and are negative.   MEDICAL HISTORY:  Past Medical History:  Diagnosis Date  . Family history of bladder cancer   . Family history of BRCA gene mutation   . Family history of breast cancer   . Family history of prostate cancer   . Lupus ([place])   . Thyroid disease     SURGICAL HISTORY: Past Surgical History:  Procedure Laterality Date  . BREAST LUMPECTOMY    . TONSILLECTOMY    . WISDOM TOOTH EXTRACTION      SOCIAL HISTORY: Social History   Socioeconomic History  . Marital status: Divorced    Spouse name: Not on file  . Number of children: 2  . Years of education: Not on file  . Highest education level: Not on file  Occupational History  . Not on file  Tobacco Use  . Smoking status:  Current Every Day Smoker    Packs/day: 1.00    Years: 50.00    Pack years: 50.00    Types: Cigarettes  . Smokeless tobacco: Never Used  Substance and Sexual Activity  . Alcohol use: Yes    Comment: once a month  . Drug use: No  . Sexual activity: Yes    Partners: Male    Comment: BTL   Other Topics Concern  . Not on file  Social History Narrative  . Not on file   Social Determinants of Health   Financial Resource Strain:   . Difficulty of Paying Living Expenses:   Food Insecurity:   . Worried About Charity fundraiser in the Last Year:   . Arboriculturist in the Last Year:   Transportation Needs:   . Film/video editor (Medical):   Marland Kitchen Lack of Transportation (Non-Medical):   Physical Activity:   . Days of Exercise per Week:   . Minutes of Exercise per Session:   Stress:   . Feeling of Stress :   Social Connections:   . Frequency of Communication with Friends and Family:   . Frequency of Social Gatherings with Friends and Family:   . Attends Religious Services:   . Active Member of Clubs or Organizations:   .  Attends Archivist Meetings:   Marland Kitchen Marital Status:   Intimate Partner Violence:   . Fear of Current or Ex-Partner:   . Emotionally Abused:   Marland Kitchen Physically Abused:   . Sexually Abused:     FAMILY HISTORY: Family History  Problem Relation Age of Onset  . Breast cancer Mother 20  . Diabetes Mother   . Bladder Cancer Mother 68       ureteral cancer  . Cancer Mother        blood cancer  . Heart attack Father   . Hyperlipidemia Father   . Hypertension Father   . Alzheimer's disease Father   . Prostate cancer Father        dx. >50  . Alzheimer's disease Other   . Cancer Maternal Uncle        prostate or colon  . BRCA 1/2 Daughter   . Breast cancer Other 45       maternal great-aunt  . Cancer Cousin        unknown type; dx. in her 51s (maternal cousin)    ALLERGIES:  has No Known Allergies.  MEDICATIONS:  Current Outpatient Medications    Medication Sig Dispense Refill  . Albuterol Sulfate (PROAIR RESPICLICK) 409 (90 Base) MCG/ACT AEPB Inhale 2 puffs into the lungs every 6 (six) hours as needed. 1 each 3  . ALPRAZolam (XANAX) 0.25 MG tablet Take 1 tablet (0.25 mg total) by mouth 2 (two) times daily as needed for anxiety. 20 tablet 0  . cetirizine (ZYRTEC) 10 MG tablet Take 10 mg by mouth daily.    . fluticasone (FLONASE) 50 MCG/ACT nasal spray fluticasone propionate 50 mcg/actuation nasal spray,suspension    . levothyroxine (SYNTHROID) 100 MCG tablet Synthroid 100 mcg tablet  TAKE 1 TABLET (100 MCG TOTAL) BY MOUTH DAILY.    Marland Kitchen levothyroxine (SYNTHROID, LEVOTHROID) 75 MCG tablet Take 1 tablet (75 mcg total) by mouth daily before breakfast. 90 tablet 1  . liothyronine (CYTOMEL) 5 MCG tablet TAKE 2 TABLETS (=10MCG     TOTAL)     DAILY 180 tablet 1  . montelukast (SINGULAIR) 10 MG tablet Take 10 mg by mouth daily.    Marland Kitchen triamcinolone cream (KENALOG) 0.1 % Apply daily to lupus for up to a few months     No current facility-administered medications for this visit.    PHYSICAL EXAMINATION: ECOG PERFORMANCE STATUS: 1 - Symptomatic but completely ambulatory  Vitals:   07/30/19 1247  BP: 120/85  Pulse: 81  Resp: 18  Temp: 100 F (37.8 C)  SpO2: 100%   Filed Weights   07/30/19 1247  Weight: 159 lb 9.6 oz (72.4 kg)    GENERAL:alert, no distress and comfortable SKIN: skin color, texture, turgor are normal, no rashes or significant lesions (+) Mild skin discoloration of face EYES: normal, Conjunctiva are pink and non-injected, sclera clear  NECK: supple, thyroid normal size, non-tender, without nodularity LYMPH:  no palpable lymphadenopathy in the cervical, axillary  LUNGS: clear to auscultation and percussion with normal breathing effort HEART: regular rate & rhythm and no murmurs and no lower extremity edema ABDOMEN:abdomen soft, non-tender and normal bowel sounds Musculoskeletal:no cyanosis of digits and no clubbing   NEURO: alert & oriented x 3 with fluent speech, no focal motor/sensory deficits BREAST: (+) skin ecchymosis at biopsy site of right breast (+) Prior b/l lumpectomy incisions healed well.  No palpable mass, nodules or adenopathy bilaterally. Breast exam benign.  LABORATORY DATA:  I have reviewed the data  as listed CBC Latest Ref Rng & Units 07/30/2019 09/12/2017 03/31/2016  WBC 4.0 - 10.5 K/uL 9.1 7.7 8.7  Hemoglobin 12.0 - 15.0 g/dL 14.3 14.7 14.7  Hematocrit 36.0 - 46.0 % 43.4 43.2 43.6  Platelets 150 - 400 K/uL 293 255 289    CMP Latest Ref Rng & Units 07/30/2019 09/12/2017 03/31/2016  Glucose 70 - 99 mg/dL 79 88 76  BUN 6 - 20 mg/dL _0 Creatinine 0.44 - 1.00 mg/dL 0.71 0.66 0.63  Sodium 135 - 145 mmol/L 137 140 140  Potassium 3.5 - 5.1 mmol/L 4.0 4.2 4.1  Chloride 98 - 111 mmol/L 101 106 106  CO2 22 - 32 mmol/L _1 Calcium 8.9 - 10.3 mg/dL 9.8 10.3 10.1  Total Protein 6.5 - 8.1 g/dL 7.4 6.7 6.8  Total Bilirubin 0.3 - 1.2 mg/dL 0.3 0.5 0.4  Alkaline Phos 38 - 126 U/L 112 - 74  AST 15 - 41 U/L _2 ALT 0 - 44 U/L _3 RADIOGRAPHIC STUDIES: I have personally reviewed the radiological images as listed and agreed with the findings in the report. MM CLIP PLACEMENT RIGHT  Result Date: 07/18/2019 CLINICAL DATA:  Status post ultrasound biopsy of a RIGHT breast mass and stereotactic biopsy of RIGHT breast calcifications. EXAM: DIAGNOSTIC RIGHT MAMMOGRAM POST ULTRASOUND and STEREOTACTIC BIOPSY COMPARISON:  Outside studies including diagnostic mammogram and ultrasound dated 07/03/2019. FINDINGS: Mammographic images were obtained following ultrasound guided biopsy of an indeterminate mass in the RIGHT breast at the 12 o'clock axis followed by stereotactic biopsy of indeterminate calcifications in the upper-outer quadrant of the RIGHT breast. The ribbon shaped clip is appropriately positioned at the expected site of the biopsy. The coil shaped biopsy clip is displaced 1.3 cm  posterior-inferior to the biopsied calcifications. IMPRESSION: 1. Appropriate positioning of the ribbon shaped biopsy marking clip at the site of biopsy in the RIGHT breast at the 12 o'clock axis. 2. The coil shaped biopsy marking clip is displaced 1.3 cm posterior-inferior to the biopsied calcifications. Final Assessment: Post Procedure Mammograms for Marker Placement Electronically Signed   By: Franki Cabot M.D.   On: 07/18/2019 09:34   MM RT BREAST BX W LOC DEV 1ST LESION IMAGE BX SPEC STEREO GUIDE  Addendum Date: 07/23/2019   ADDENDUM REPORT: 07/23/2019 11:54 ADDENDUM: Pathology revealed GRADE III INVASIVE DUCTAL CARCINOMA of the RIGHT breast, 12 o'clock. This was found to be concordant by Dr. Franki Cabot. 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 This was found to be concordant by Dr. Franki Cabot. Pathology results were discussed with the patient by telephone. The patient reported doing well after the biopsies with tenderness at the sites. Post biopsy instructions and care were reviewed and questions were answered. The patient was encouraged to call [facility] for any additional concerns. Per patient request, the patient was referred to [facility] at [facility] on Jul 30, 2019. Pathology results reported by Stacie Acres RN on 07/23/2019. Electronically Signed   By: Franki Cabot M.D.   On: 07/23/2019 11:54   Result Date: 07/23/2019 CLINICAL DATA:  Patient with indeterminate calcifications in the upper-outer quadrant the RIGHT breast presents today for stereotactic biopsy. EXAM: RIGHT BREAST STEREOTACTIC CORE NEEDLE BIOPSY COMPARISON:  Previous exams. FINDINGS: The patient and I discussed the procedure of stereotactic-guided biopsy including benefits and alternatives. We discussed the  high likelihood of a successful procedure. We discussed the risks of the procedure  including infection, bleeding, tissue injury, clip migration, and inadequate sampling. Informed written consent was given. The usual time out protocol was performed immediately prior to the procedure. Using sterile technique and 1% Lidocaine as local anesthetic, under stereotactic guidance, a 9 gauge vacuum assisted device was used to perform core needle biopsy of calcifications in the upper outer quadrant of the RIGHT breast using a superior approach. Specimen radiograph was performed showing calcifications. Specimens with calcifications are identified for pathology. Lesion quadrant: Upper-outer quadrant At the conclusion of the procedure, coil shaped tissue marker clip was deployed into the biopsy cavity. Follow-up 2-view mammogram was performed and dictated separately. IMPRESSION: Stereotactic-guided biopsy of indeterminate calcifications within the upper-outer quadrant of the RIGHT breast. No apparent complications. Electronically Signed: By: Franki Cabot M.D. On: 07/18/2019 09:23   [place] RT BREAST BX W LOC DEV 1ST LESION IMG BX SPEC US GUIDE  Addendum Date: 07/23/2019   ADDENDUM REPORT: 07/23/2019 11:53 ADDENDUM: Pathology revealed GRADE III INVASIVE DUCTAL CARCINOMA of the RIGHT breast, 12 o'clock. This was found to be concordant by Dr. Franki Cabot. 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 This was found to be concordant by Dr. Franki Cabot. Pathology results were discussed with the patient by telephone. The patient reported doing well after the biopsies with tenderness at the sites. Post biopsy instructions and care were reviewed and questions were answered. The patient was encouraged to call [facility] for any additional concerns. Per patient request, the patient was referred to [facility] on Jul 30, 2019. Pathology results reported by Stacie Acres RN on  07/23/2019. Electronically Signed   By: Franki Cabot M.D.   On: 07/23/2019 11:53   Result Date: 07/23/2019 CLINICAL DATA:  Patient with an indeterminate mass in the RIGHT breast at the 12 o'clock axis presents today for ultrasound-guided core biopsy. Patient has additional indeterminate calcifications within the upper RIGHT breast for which a stereotactic biopsy will be performed later today. EXAM: ULTRASOUND GUIDED RIGHT BREAST CORE NEEDLE BIOPSY COMPARISON:  Previous exam(s). PROCEDURE: I met with the patient and we discussed the procedure of ultrasound-guided biopsy, including benefits and alternatives. We discussed the high likelihood of a successful procedure. We discussed the risks of the procedure, including infection, bleeding, tissue injury, clip migration, and inadequate sampling. Informed written consent was given. The usual time-out protocol was performed immediately prior to the procedure. Lesion quadrant: 12 o'clock Using sterile technique and 1% Lidocaine as local anesthetic, under direct ultrasound visualization, a 12 gauge spring-loaded device was used to perform biopsy of the RIGHT breast mass at the 12 o'clock axis using a medial approach. At the conclusion of the procedure ribbon shaped tissue marker clip was deployed into the biopsy cavity. Follow up 2 view mammogram was performed and dictated separately. IMPRESSION: Ultrasound guided biopsy of the RIGHT breast mass at the 12 o'clock axis. No apparent complications. Electronically Signed: By: Franki Cabot M.D. On: 07/18/2019 08:58    ASSESSMENT & PLAN:  Sameerah Nachtigal is a 59 y.o. Caucasian female with a history of Lupus and Thyroid.    1. Malignant neoplasm of upper-outer quadrant of right breast, invasive ductal carcinoma and DCIS, StageIA, cT1bNxM0, ER+/HER2+, PR-, Grade III  -We discussed her image findings and the biopsy results in great details. She was found to have 2 foci  in right breast, 1.6cm DCIS in UOQ of and the 1.3cm  invasive ductal carcinoma at 12:00 position.  -Given multifocal disease, and her relatively small size of breast, she would likely needs mastectomy. She was seen by Dr. Barry Dienes today and likely will proceed with surgery first.  -Before Surgery, Breast MRI is recommended to evaluate her breast cancer and axillary LNs given her HER2 positive cancer. -The risk of recurrence depends on the stage and biology of the tumor. She is early stage, with ER/HER positive and PR negative markers. I discussed this is more aggressive given her HER2 positive disease resulting in higher risk of cancer recurrence.   -To reduce her risk of recurrence I recommend adjuvant chemotherapy with weekly Taxol and anti-HER2 Herceptin q3weeks for 12 weeks and continue maintenance Herceptin alone to complete 1 year treatment.   -Chemotherapy consent: Side effects including but does not limited to, fatigue, nausea, vomiting, diarrhea, hair loss, neuropathy, fluid retention, renal and kidney dysfunction, neutropenic fever, needed for blood transfusion, bleeding, were discussed with patient in great detail. I discussed the option of Dignicap to reduce hair loss. She is interested.  -I discussed if LN was found to be involved or her tumor is more than 2cm, I would recommend more aggressive  TCH or TCHP q3weeks for 6 cycles then maintenance Herceptin/Perjeta. The goal of therapy is curative -Given long term IV treatment she would benefit from [facility] placement during surgery. She understands. -She was also seen by radiation oncologist Dr. Lisbeth Renshaw today. If her surgical sentinel lymph nodes were negative, she would not need post mastectomy radiation. Otherwise radiation is recommended to reduce the risk for local recurrence.  -Given the strong ER positive expression, I recommend adjuvant endocrine therapy for a total of 5-10 years to reduce the risk of cancer recurrence. She has not had period in 10-15 since uterine ablation. She has had hot flashes  for 2-3 years. Will check her Hormonal level and her Bone density before starting as she may be better suited for Tamoxifen. Potential benefits and side effects were discussed with patient and she is interested. -We also discussed the breast cancer surveillance after her surgery. She will continue annual screening mammogram, self exam, and a routine office visit with lab and exam with [place]. -I encouraged her to have healthy diet and exercise regularly -Labs reviewed, CBC and CMP WNL. Physical exam unremarkable.  -f/u after surgery    2. Genetic Testing  -She has family history of breast, bladder, prostate cancer. She is eligible for genetic testing. She is agreeable, will send referral.    3. Smoking Cessation, COPD -She has been smoking since the 2nd grade. She has been able to cut down to 1ppd recently. She notes she is willing to further reduce smoking. I discussed smoking risk and how it can effect her healing after surgery. I discussed she may not be offered breast reconstruction unless she quits smoking completely.  -Per pt at some point she may was diagnosed with COPD or Emphysema. She is on albuterol inhaler and  Singulair.    4. Comorbidities: Discoid Lupus, Vitiligo, Thyroid disease.  -She was diagnosed with Lupus 10 years ago. She uses steroid cream as needed for her lupus rashes, some skin lesions of which have been removed. -She sees her dermatologist Dr Nevada Crane and their [place].  -She is on 2 thyroid medications.    5. Social Support, Anxiety  -She lives alone but has good support from her 2 daughters but they live  out of town. She notes it is fine to have both her daughter be aware of her medical condition and are the first to contact. She has a boyfriend Stephanie Chase who would be later to contact.  -She is considering using short or long term disability during chemo so she can be off work.  -She notes she is anxious about medical procedures overall.  -Her daughter notes the  patient has anxiety or panic attacks when she gets overwhelmed or disappointments. Her daughter would like her to have something to help her anxiety.  -I will call in low dose Xanax (07/30/19) to take only as needed. I reviewed this is a controlled substance and can lead to dependence with regular use. She voiced good understanding.  -I offered her the chance to speak with out SW, she declined.    PLAN:  -I called in Xanax today  -Send Genetic referral  -Breast MRI in 1-2 weeks  -Proceed with breast Surgery(right mastectomy and sentinel lymph node biopsy) and PAC placement.  -F/u 3 weeks after surgery  -Echocardiogram before or after surgery   No orders of the defined types were placed in this encounter.   All questions were answered. The patient knows to call the clinic with any problems, questions or concerns. The total time spent in the appointment was 60 minutes.     Truitt Merle, MD 07/30/2019 5:10 PM  I, Joslyn Devon, am acting as scribe for Truitt Merle, MD.   I have reviewed the above documentation for accuracy and completeness, and I agree with the above.

## 2019-07-29 ENCOUNTER — Other Ambulatory Visit: Payer: Self-pay

## 2019-07-30 ENCOUNTER — Encounter: Payer: Self-pay | Admitting: Genetic Counselor

## 2019-07-30 ENCOUNTER — Encounter: Payer: Self-pay | Admitting: Hematology

## 2019-07-30 ENCOUNTER — Encounter: Payer: Self-pay | Admitting: *Deleted

## 2019-07-30 ENCOUNTER — Inpatient Hospital Stay: Payer: BC Managed Care – PPO

## 2019-07-30 ENCOUNTER — Ambulatory Visit
Admission: RE | Admit: 2019-07-30 | Discharge: 2019-07-30 | Disposition: A | Discharge status: Home / Self Care | Payer: BC Managed Care – PPO | Source: Ambulatory Visit | Attending: Radiation Oncology | Admitting: Radiation Oncology

## 2019-07-30 ENCOUNTER — Other Ambulatory Visit: Payer: Self-pay | Admitting: *Deleted

## 2019-07-30 ENCOUNTER — Inpatient Hospital Stay: Payer: BC Managed Care – PPO | Attending: Hematology | Admitting: Hematology

## 2019-07-30 ENCOUNTER — Ambulatory Visit (HOSPITAL_BASED_OUTPATIENT_CLINIC_OR_DEPARTMENT_OTHER): Payer: BC Managed Care – PPO | Admitting: Genetic Counselor

## 2019-07-30 ENCOUNTER — Encounter: Payer: Self-pay | Admitting: Physical Therapy

## 2019-07-30 ENCOUNTER — Ambulatory Visit: Payer: BC Managed Care – PPO | Attending: General Surgery | Admitting: Physical Therapy

## 2019-07-30 ENCOUNTER — Other Ambulatory Visit: Payer: Self-pay

## 2019-07-30 VITALS — BP 120/85 | HR 81 | Temp 100.0°F | Resp 18 | Ht 65.0 in | Wt 159.6 lb

## 2019-07-30 DIAGNOSIS — C50411 Malignant neoplasm of upper-outer quadrant of right female breast: Secondary | ICD-10-CM | POA: Diagnosis present

## 2019-07-30 DIAGNOSIS — J449 Chronic obstructive pulmonary disease, unspecified: Secondary | ICD-10-CM | POA: Insufficient documentation

## 2019-07-30 DIAGNOSIS — Z803 Family history of malignant neoplasm of breast: Secondary | ICD-10-CM

## 2019-07-30 DIAGNOSIS — F419 Anxiety disorder, unspecified: Secondary | ICD-10-CM | POA: Diagnosis not present

## 2019-07-30 DIAGNOSIS — Z170 Estrogen receptor positive status [ER+]: Secondary | ICD-10-CM

## 2019-07-30 DIAGNOSIS — L930 Discoid lupus erythematosus: Secondary | ICD-10-CM

## 2019-07-30 DIAGNOSIS — Z8052 Family history of malignant neoplasm of bladder: Secondary | ICD-10-CM

## 2019-07-30 DIAGNOSIS — Z8481 Family history of carrier of genetic disease: Secondary | ICD-10-CM

## 2019-07-30 DIAGNOSIS — Z8042 Family history of malignant neoplasm of prostate: Secondary | ICD-10-CM

## 2019-07-30 DIAGNOSIS — R293 Abnormal posture: Secondary | ICD-10-CM | POA: Diagnosis present

## 2019-07-30 DIAGNOSIS — L80 Vitiligo: Secondary | ICD-10-CM | POA: Insufficient documentation

## 2019-07-30 LAB — CBC WITH DIFFERENTIAL (CANCER CENTER ONLY)
Abs Immature Granulocytes: 0.02 10*3/uL (ref 0.00–0.07)
Basophils Absolute: 0 10*3/uL (ref 0.0–0.1)
Basophils Relative: 0 %
Eosinophils Absolute: 0.1 10*3/uL (ref 0.0–0.5)
Eosinophils Relative: 1 %
HCT: 43.4 % (ref 36.0–46.0)
Hemoglobin: 14.3 g/dL (ref 12.0–15.0)
Immature Granulocytes: 0 %
Lymphocytes Relative: 32 %
Lymphs Abs: 2.9 10*3/uL (ref 0.7–4.0)
MCH: 29 pg (ref 26.0–34.0)
MCHC: 32.9 g/dL (ref 30.0–36.0)
MCV: 88 fL (ref 80.0–100.0)
Monocytes Absolute: 0.7 10*3/uL (ref 0.1–1.0)
Monocytes Relative: 8 %
Neutro Abs: 5.4 10*3/uL (ref 1.7–7.7)
Neutrophils Relative %: 59 %
Platelet Count: 293 10*3/uL (ref 150–400)
RBC: 4.93 MIL/uL (ref 3.87–5.11)
RDW: 13.2 % (ref 11.5–15.5)
WBC Count: 9.1 10*3/uL (ref 4.0–10.5)
nRBC: 0 % (ref 0.0–0.2)

## 2019-07-30 LAB — CMP (CANCER CENTER ONLY)
ALT: 22 U/L (ref 0–44)
AST: 24 U/L (ref 15–41)
Albumin: 3.9 g/dL (ref 3.5–5.0)
Alkaline Phosphatase: 112 U/L (ref 38–126)
Anion gap: 10 (ref 5–15)
BUN: 6 mg/dL (ref 6–20)
CO2: 26 mmol/L (ref 22–32)
Calcium: 9.8 mg/dL (ref 8.9–10.3)
Chloride: 101 mmol/L (ref 98–111)
Creatinine: 0.71 mg/dL (ref 0.44–1.00)
GFR, Est AFR Am: >60 mL/min (ref >60)
GFR, Estimated: >60 mL/min (ref >60)
Glucose, Bld: 79 mg/dL (ref 70–99)
Potassium: 4 mmol/L (ref 3.5–5.1)
Sodium: 137 mmol/L (ref 135–145)
Total Bilirubin: 0.3 mg/dL (ref 0.3–1.2)
Total Protein: 7.4 g/dL (ref 6.5–8.1)

## 2019-07-30 LAB — GENETIC SCREENING ORDER

## 2019-07-30 MED ORDER — ALPRAZOLAM 0.25 MG PO TABS
0.2500 mg | ORAL_TABLET | Freq: Two times a day (BID) | ORAL | 0 refills | Status: DC | PRN
Start: 2019-07-30 — End: 2019-11-17

## 2019-07-30 NOTE — Therapy (Signed)
[place], [place], [address] Phone: [phone]   Fax:  [phone]  Physical Therapy Evaluation  Patient Details  Name: Stephanie Chase MRN: [id number] Date of Birth: 08-04-1960 Referring Provider (PT): Dr. Stark Klein   Encounter Date: 07/30/2019  PT End of Session - 07/30/19 1629    Visit Number  1    Number of Visits  2    Date for PT Re-Evaluation  09/24/19    PT Start Time  1406    PT Stop Time  3254   Also saw pt from [id number] for a total of 40 min   PT Time Calculation (min)  15 min    Activity Tolerance  Patient tolerated treatment well    Behavior During Therapy  [facility] for tasks assessed/performed       Past Medical History:  Diagnosis Date  . Family history of bladder cancer   . Family history of BRCA gene mutation   . Family history of breast cancer   . Family history of prostate cancer   . Lupus ([place])   . Thyroid disease     Past Surgical History:  Procedure Laterality Date  . BREAST LUMPECTOMY    . TONSILLECTOMY    . WISDOM TOOTH EXTRACTION      There were no vitals filed for this visit.   Subjective Assessment - 07/30/19 1426    Subjective  Patient reports she is here today to be seen by her medical team for her newly diagnosed right breast cancer.    Patient is accompained by:  Family member    Pertinent History  Patient was diagnosed on 06/09/2019 with right grade III invasive ductal carcinoma breast cancer. It mesures 1.3 cm in the upper outer quadrant with 1.6 cm of calcifications and DCIS. It is ER positive, PR negative, and HER2 positive with a Ki67 of 50%. She smokes about 1 pack/day.    Patient Stated Goals  reduce lymphedema risk and learn post op shoulder ROM HEP    Currently in Pain?  No/denies         [facility] PT Assessment - 07/30/19 0001      Assessment   Medical Diagnosis  Right breast cancer    Referring Provider (PT)  Dr. Stark Klein    Onset  Date/Surgical Date  06/09/19    Hand Dominance  Right    Prior Therapy  none      Precautions   Precautions  Other (comment)    Precaution Comments  active cancer      Restrictions   Weight Bearing Restrictions  No      Balance Screen   Has the patient fallen in the past 6 months  No    Has the patient had a decrease in activity level because of a fear of falling?   No    Is the patient reluctant to leave their home because of a fear of falling?   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    Leisure  She does not exercise      Cognition   Overall Cognitive Status  Within Functional Limits for tasks assessed  Posture/Postural Control   Posture/Postural Control  Postural limitations    Postural Limitations  Forward head;Rounded Shoulders      ROM / Strength   AROM / PROM / Strength  AROM;Strength      AROM   AROM Assessment Site  Shoulder    Right/Left Shoulder  Right;Left    Right Shoulder Extension  45 Degrees    Right Shoulder Flexion  155 Degrees    Right Shoulder ABduction  153 Degrees    Right Shoulder Internal Rotation  59 Degrees    Right Shoulder External Rotation  84 Degrees    Left Shoulder Extension  53 Degrees    Left Shoulder Flexion  143 Degrees    Left Shoulder ABduction  159 Degrees    Left Shoulder Internal Rotation  62 Degrees    Left Shoulder External Rotation  84 Degrees      Strength   Overall Strength  Within functional limits for tasks performed        LYMPHEDEMA/ONCOLOGY QUESTIONNAIRE - 07/30/19 1604      Type   Cancer Type  Right breast cancer      Lymphedema Assessments   Lymphedema Assessments  Upper extremities      Right Upper Extremity Lymphedema   10 cm Proximal to Olecranon Process  28.8 cm     Olecranon Process  24.6 cm    10 cm Proximal to Ulnar Styloid Process  23.3 cm    Just Proximal to Ulnar Styloid Process  15.8 cm    Across Hand at [employer]  18.7 cm    At [place] of 2nd Digit  6.5 cm      Left Upper Extremity Lymphedema   10 cm Proximal to Olecranon Process  28.1 cm    Olecranon Process  24.5 cm    10 cm Proximal to Ulnar Styloid Process  22.4 cm    Just Proximal to Ulnar Styloid Process  14.8 cm    Across Hand at [employer]  19.2 cm    At [place] of 2nd Digit  6.5 cm          Quick Dash - 07/30/19 0001    Open a tight or new jar  No difficulty    Do heavy household chores (wash walls, wash floors)  No difficulty    Carry a shopping bag or briefcase  No difficulty    Wash your back  No difficulty    Use a knife to cut food  No difficulty    Recreational activities in which you take some force or impact through your arm, shoulder, or hand (golf, hammering, tennis)  No difficulty    During the past week, to what extent has your arm, shoulder or hand problem interfered with your normal social activities with family, friends, neighbors, or groups?  Not at all    During the past week, to what extent has your arm, shoulder or hand problem limited your work or other regular daily activities  Not at all    Arm, shoulder, or hand pain.  None    Tingling (pins and needles) in your arm, shoulder, or hand  None    Difficulty Sleeping  No difficulty    DASH Score  0 %        Objective measurements completed on examination: See above findings.       Patient was instructed today in a home exercise program today for post op shoulder range of motion. These included  active assist shoulder flexion in sitting, scapular retraction, wall walking with shoulder abduction, and hands behind head external rotation.  She was encouraged to do these twice a day, holding 3 seconds and repeating 5 times when permitted by her physician.           PT Education - 07/30/19  1605    Education Details  Lymphedema risk reduction and post op shoulder ROM HEP    Person(s) Educated  Patient;Child(ren)    Methods  Explanation;Demonstration;Handout    Comprehension  Returned demonstration;Verbalized understanding          PT Long Term Goals - 07/30/19 1633      PT LONG TERM GOAL #1   Title  Patient will demonstrate she has regained shoulder ROM and function post operatively compared ot baselines.    Time  8    Period  Weeks    Status  New    Target Date  09/24/19      Breast Clinic Goals - 07/30/19 1633      Patient will be able to verbalize understanding of pertinent lymphedema risk reduction practices relevant to her diagnosis specifically related to skin care.   Time  1    Period  Days    Status  Achieved      Patient will be able to return demonstrate and/or verbalize understanding of the post-op home exercise program related to regaining shoulder range of motion.   Time  1    Period  Days    Status  Achieved      Patient will be able to verbalize understanding of the importance of attending the postoperative After Breast Cancer Class for further lymphedema risk reduction education and therapeutic exercise.   Time  1    Period  Days    Status  Achieved            Plan - 07/30/19 1630    Clinical Impression Statement  Patient was diagnosed on 06/09/2019 with right grade III invasive ductal carcinoma breast cancer. It mesures 1.3 cm in the upper outer quadrant with 1.6 cm of calcifications and DCIS. It is ER positive, PR negative, and HER2 positive with a Ki67 of 50%. She smokes about 1 pack/day. Her multidisciplinary medical team met prior to her assessments to determine a reommended treatment plan. She is planning to have a bilateral mastectomy with a sentinel node biopsy followed by chemotherapy and anti-estrogen therapy. She will benefit from a post op PT reassessment to determine needs and L-Dex screenings every 3 months for 2 years to detect  subclinical lymphedema.    Stability/Clinical Decision Making  Stable/Uncomplicated    Clinical Decision Making  Low    Rehab Potential  Excellent    PT Frequency  --   eval and 1 f/u visit   PT Treatment/Interventions  ADLs/Self Care Home Management;Therapeutic exercise;Patient/family education    PT Next Visit Plan  Will reassess 3-4 weeks post op to determine needs    PT Home Exercise Plan  Post op shoulder ROM HEP    Consulted and Agree with Plan of Care  Patient;Family member/caregiver    Family Member Consulted  Daughters       Patient will benefit from skilled therapeutic intervention in order to improve the following deficits and impairments:  Decreased knowledge of precautions, Postural dysfunction, Impaired UE functional use, Pain  Visit Diagnosis: Malignant neoplasm of upper-outer quadrant of right breast in female, estrogen receptor positive ([place]) - Plan: PT plan of care cert/re-cert  Abnormal posture - Plan: PT plan of care cert/re-cert   The patient was assessed using the L-Dex machine today to produce a lymphedema index baseline score. The patient will be reassessed on a regular basis (typically every 3 months) to obtain new L-Dex scores. If the score is > 6.5 points away from his/her baseline score indicating onset of subclinical lymphedema, it will be recommended to wear a compression garment for 4 weeks, 12 hours per day and then be reassessed. If the score continues to be > 6.5 points from baseline at reassessment, we will initiate lymphedema treatment. Assessing in this manner has a 95% rate of preventing clinically significant lymphedema.  Patient will follow up at outpatient cancer rehab 3-4 weeks following surgery.  If the patient requires physical therapy at that time, a specific plan will be dictated and sent to the referring physician for approval. The patient was educated today on appropriate basic range of motion exercises to begin post operatively and the  importance of attending the After Breast Cancer class following surgery.  Patient was educated today on lymphedema risk reduction practices as it pertains to recommendations that will benefit the patient immediately following surgery.  She verbalized good understanding.      Problem List Patient Active Problem List   Diagnosis Date Noted  . Family history of BRCA gene mutation   . Family history of breast cancer   . Family history of prostate cancer   . Family history of bladder cancer   . Malignant neoplasm of upper-outer quadrant of right breast in female, estrogen receptor positive ([place]) 07/24/2019  . Annual physical exam 03/31/2016  . Occupational bronchitis ([place]) 03/03/2015  . Hyperlipidemia 03/30/2014  . Difficulty hearing 02/13/2012  . Hypothyroidism 01/26/2012  . History of lupus 12/21/2011   Annia Friendly, PT 07/30/19 4:40 PM  [place], [place], [address] Phone: [phone]   Fax:  [phone]  Name: Stephanie Chase MRN: [id number] Date of Birth: 08-25-1960

## 2019-07-30 NOTE — Patient Instructions (Signed)

## 2019-07-30 NOTE — Progress Notes (Signed)
REFERRING PROVIDER: Truitt Merle, MD [place],  [place] [address]  PRIMARY PROVIDER:  Janie Morning, DO  PRIMARY REASON FOR VISIT:  1. Malignant neoplasm of upper-outer quadrant of right breast in female, estrogen receptor positive ([place])   2. Family history of BRCA gene mutation   3. Family history of breast cancer   4. Family history of prostate cancer   5. Family history of bladder cancer     I connected with [name] on 07/30/2019 at 3:30pm EDT by Webex video conference and verified that I am speaking with the correct person using two identifiers. She was accompanied by her two daughters - Stephanie Chase and Stephanie Chase.  Patient location: [facility] clinic Provider location: [facility] office  HISTORY OF PRESENT ILLNESS:   [name], a 59 y.o. female, was seen for a [place] cancer genetics consultation at the request of Dr. Burr Medico due to a personal and family history of breast cancer.  [name] presents to clinic today to discuss the possibility of a hereditary predisposition to cancer, genetic testing, and to further clarify her future cancer risks, as well as potential cancer risks for family members.   In 2021, at the age of 24, Stephanie Chase was diagnosed with invasive ductal carcinoma, ER+/PR-/Her2+, of the right breast. The treatment plan includes surgery, chemotherapy, radiation therapy as appropriate, and antiestrogen therapy.   CANCER HISTORY:  Oncology History Overview Note  Cancer Staging No matching staging information was found for the patient.    Malignant neoplasm of upper-outer quadrant of right breast in female, estrogen receptor positive ([place])  07/03/2019 Mammogram   Diagnostic Mammogram  IMPRESSION There is a 1.3x1.1cm focal asymmetry in the retroareolar anterior to middle depth right breast 2.2cm from the nipple.  There is a 2 cm focal asymmetry with appearance in the upper outer quadrant posterior depth of right breast 3 cm from nipple -Both suspicious and  biopsy recommended.     07/18/2019 Initial Biopsy   Diagnosis 07/18/19 1. Breast, right, needle core biopsy, 12 o'clock - INVASIVE DUCTAL CARCINOMA. 2. Breast, right, needle core biopsy, upper outer quadrant - DUCTAL CARCINOMA IN SITU. Microscopic Comment 1. The invasive carcinoma is nuclear grade 3. The greatest linear extent of tumor in any one core is 11 mm. Ancillary studies will be reported separately. CKAE1AE3, GATA-3, ER and E-cadherin are positive. PAX 8 is negative. CK7 is noncontributory. 2. The in situ carcinoma is high nuclear grade with central necrosis and calcifications. E-cadherin is positive. P63, Calponin and SMM-1 demonstrate the present of myoepithelium.   07/18/2019 Receptors her2   1. PROGNOSTIC INDICATORS 07/18/19 Results: IMMUNOHISTOCHEMICAL AND MORPHOMETRIC ANALYSIS PERFORMED MANUALLY The tumor cells are POSITIVE for Her2 (3+). Of note, the tumor shows heterogeneity in regards to Her2 expression. Estrogen Receptor: 95%, POSITIVE, STRONG STAINING INTENSITY Progesterone Receptor: 0%, NEGATIVE Proliferation Marker Ki67: 50%   07/24/2019 Initial Diagnosis   Malignant neoplasm of upper-outer quadrant of right breast in female, estrogen receptor positive ([place])     RISK FACTORS:  Menarche was at age 58.  First live birth at age 19.  OCP use for approximately 1 years.  Ovaries intact: yes.  Hysterectomy: no.  Menopausal status: unknown.  HRT use: 0 years. Colonoscopy: yes; 5 years ago, polyps per patient. Mammogram within the last year: yes. Any excessive radiation exposure in the past: no   Past Medical History:  Diagnosis Date  . Family history of bladder cancer   . Family history of BRCA gene mutation   .  Family history of breast cancer   . Family history of prostate cancer   . Lupus ([place])   . Thyroid disease     Past Surgical History:  Procedure Laterality Date  . BREAST LUMPECTOMY    . TONSILLECTOMY    . WISDOM TOOTH EXTRACTION      Social  History   Socioeconomic History  . Marital status: Divorced    Spouse name: Not on file  . Number of children: 2  . Years of education: Not on file  . Highest education level: Not on file  Occupational History  . Not on file  Tobacco Use  . Smoking status: Current Every Day Smoker    Packs/day: 1.00    Years: 50.00    Pack years: 50.00    Types: Cigarettes  . Smokeless tobacco: Never Used  Substance and Sexual Activity  . Alcohol use: Yes    Comment: once a month  . Drug use: No  . Sexual activity: Yes    Partners: Male    Comment: BTL   Other Topics Concern  . Not on file  Social History Narrative  . Not on file   Social Determinants of Health   Financial Resource Strain:   . Difficulty of Paying Living Expenses:   Food Insecurity:   . Worried About Charity fundraiser in the Last Year:   . Arboriculturist in the Last Year:   Transportation Needs:   . Film/video editor (Medical):   Marland Kitchen Lack of Transportation (Non-Medical):   Physical Activity:   . Days of Exercise per Week:   . Minutes of Exercise per Session:   Stress:   . Feeling of Stress :   Social Connections:   . Frequency of Communication with Friends and Family:   . Frequency of Social Gatherings with Friends and Family:   . Attends Religious Services:   . Active Member of Clubs or Organizations:   . Attends Archivist Meetings:   Marland Kitchen Marital Status:      FAMILY HISTORY:  We obtained a detailed, 4-generation family history.  Significant diagnoses are listed below: Family History  Problem Relation Age of Onset  . Breast cancer Mother 15  . Diabetes Mother   . Bladder Cancer Mother 15       ureteral cancer  . Cancer Mother        blood cancer  . Heart attack Father   . Hyperlipidemia Father   . Hypertension Father   . Alzheimer's disease Father   . Prostate cancer Father        dx. >50  . Alzheimer's disease Other   . Cancer Maternal Uncle        prostate or colon  . BRCA 1/2  Daughter   . Breast cancer Other 45       maternal great-aunt  . Cancer Cousin        unknown type; dx. in her 73s (maternal cousin)    Ms. Hanssen has two daughters - Stephanie Chase (age 50) and Stephanie Chase (age 55). Neither have had cancer, although Stephanie Chase has had positive BRCA testing (report not available for review). Ms. Holcomb has two paternal half-brothers, two paternal half-sisters, a maternal half-brother, and a maternal half-sister. She does not know if any of these siblings have had cancer.  Ms. Hoots mother is 75 and has a history of breast cancer at age 52, bladder/ureteral cancer at age 70, and blood cancer. Her mother is in the  process of having BRCA1/2 testing. Ms. Zambrana has three maternal uncles and three maternal aunts. One uncle had either prostate or colon cancer diagnosed at an unknown age. Her maternal grandparents died when they were older than 90. Ms. Jasmer maternal great-aunt (grandmother's sister) had breast cancer in her 29s. This great-aunt has two grandchildren with cancer - one with breast cancer in her 35s, and one with esophageal cancer in his 55s. There are no other known diagnoses of cancer on the maternal side of the family.  Ms. Kolker father died at the age of 19 and had a history of prostate cancer diagnosed when he was older than 88, although he did not die from prostate cancer. She had two paternal uncles and one paternal aunt. Both uncles had either prostate or colon cancer and died when they were older than 89. Her paternal grandparents died when they were older than 28. There are no other known diagnoses of cancer on the paternal side of the family.  Ms. Venneman is aware of previous family history of genetic testing for hereditary cancer risks - her daughter is BRCA positive and her mother is in the progress of having BRCA testing. Her maternal ancestors are of [place] descent, and paternal ancestors are of [place] and [place] descent. There is no reported Ashkenazi  Jewish ancestry. There is no known consanguinity.  GENETIC COUNSELING ASSESSMENT: Ms. Loper is a 59 y.o. female with a personal and family history of breast cancer, and a family history of a known BRCA mutation, which is somewhat suggestive of a hereditary cancer and predisposition to cancer in Ms. Florio. We, therefore, discussed and recommended the following at today's visit.   DISCUSSION: Approximately 5-10% of breast cancer is hereditary, with most cases associated with the BRCA1 and BRCA2 genes. There are other genes that can be associated with hereditary breast cancer syndromes. These include ATM, CHEK2, PALB2, etc. We discussed that testing is beneficial for several reasons, including knowing about other cancer risks, identifying potential screening and risk-reduction options that may be appropriate, and to understand if other family members could be at risk for cancer and allow them to undergo genetic testing.   We reviewed the characteristics, features and inheritance patterns of hereditary cancer syndromes. We also discussed genetic testing, including the appropriate family members to test, the process of testing, insurance coverage and turn-around-time for results. We discussed the implications of a negative, positive and/or variant of uncertain significant result. Ms. Deane states that her genetic test results would not influence her surgery decision. Therefore, we recommended Ms. Richardson Landry pursue genetic testing for the [facility] Common Hereditary Cancers panel.   The Common Hereditary Cancers Panel offered by [facility] includes sequencing and/or deletion duplication testing of the following 48 genes: APC, ATM, AXIN2, BARD1, BMPR1A, BRCA1, BRCA2, BRIP1, CDH1, CDK4, CDKN2A (p14ARF), CDKN2A (p16INK4a), CHEK2, CTNNA1, DICER1, EPCAM (Deletion/duplication testing only), GREM1 (promoter region deletion/duplication testing only), KIT, MEN1, MLH1, MSH2, MSH3, MSH6, MUTYH, NBN, NF1, NHTL1, PALB2, PDGFRA,  PMS2, POLD1, POLE, PTEN, RAD50, RAD51C, RAD51D, RNF43, SDHB, SDHC, SDHD, SMAD4, SMARCA4. STK11, TP53, TSC1, TSC2, and VHL.  The following genes are evaluated for sequence changes only: SDHA and HOXB13 c.251G>A variant only.   Based on Ms. Wedig's personal and family history of cancer, she meets medical criteria for genetic testing. Despite that she meets criteria, she may still have an out of pocket cost. We discussed that if her out of pocket cost for testing is over $100, the laboratory will let her know via text  and/or email.  If the out of pocket cost of testing is less than $100 she will be billed by the genetic testing laboratory.     PLAN: After considering the risks, benefits, and limitations, Ms. Jarecki provided informed consent to pursue genetic testing and the blood sample was sent to [facility] for analysis of the Common Hereditary Cancers Panel. Results should be available within approximately two-three weeks' time, at which point they will be disclosed by telephone to Ms. Gwynn, as will any additional recommendations warranted by these results. Ms. Calles will receive a summary of her genetic counseling visit and a copy of her results once available. This information will also be available in Epic.   We also recommended that Ms. Tinkle's daughter, Stephanie Chase, have genetic counseling to review her genetic test result and appropriate cancer screening and risk-reduction options that she may consider.   Ms. Kosta questions were answered to her satisfaction today. Our contact information was provided should additional questions or concerns arise. Thank you for the referral and allowing [place] to share in the care of your patient.   Clint Guy, [place], [facility] Licensed, Certified Dispensing [email or web address] Phone: [phone]  The patient was seen for a total of 30 minutes in face-to-face genetic counseling.  This patient was discussed with Drs. Magrinat, Lindi Adie  and/or Burr Medico who agrees with the above.    _______________________________________________________________________ For Office Staff:  Number of people involved in session: 1 Was an Intern/ student involved with case: no

## 2019-07-30 NOTE — Progress Notes (Signed)
Radiation Oncology         [phone]) [phone] ________________________________  Name: Stephanie Chase        MRN: [id number]  Date of Service: 07/30/2019 DOB: 12/08/1960  AL:[id number], Gwen Her, MD  Stark Klein, MD     REFERRING PHYSICIAN: Stark Klein, MD   DIAGNOSIS: The encounter diagnosis was Malignant neoplasm of upper-outer quadrant of right breast in female, estrogen receptor positive ([place]).   HISTORY OF PRESENT ILLNESS: Stephanie Chase is a 59 y.o. female seen in the multidisciplinary breast clinic for a new diagnosis of right breast cancer. The patient was noted to have screening detected abnormality in the right breast with asymmetry and calcifications.  She has a history of a biopsy in 2013 of the right breast which was negative for disease.  The lesion that was seen on mammogram by ultrasound was seen at 12:00 measuring 1.3 x 1.1 x 0.5 cm, a separate cluster of calcifications in the retroareolar breast spanning 1.6 cm and there was another 2 cm area of asymmetry at 10:00 which was felt to be consistent with her prior biopsy site.  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  She is seen today to discuss treatment recommendations for her cancer.    PREVIOUS RADIATION THERAPY: No   PAST MEDICAL HISTORY:  Past Medical History:  Diagnosis Date  . Lupus ([place])   . Thyroid disease        PAST SURGICAL HISTORY: Past Surgical History:  Procedure Laterality Date  . BREAST LUMPECTOMY    . TONSILLECTOMY    . WISDOM TOOTH EXTRACTION       FAMILY HISTORY:  Family History  Problem Relation Age of Onset  . Breast cancer Mother   . Diabetes Mother   . Heart attack  Father   . Hyperlipidemia Father   . Hypertension Father   . Alzheimer's disease Father   . Alzheimer's disease Other      SOCIAL HISTORY:  reports that she has been smoking cigarettes. She has been smoking about 1.00 pack per day. She has never used smokeless tobacco. She reports current alcohol use. She reports that she does not use drugs. The patient lives in [place] and is in a relationship with Stephanie Chase. She works at [employer] and her daughters are able to join [place], one in person and the other by [employer].   ALLERGIES: Patient has no known allergies.   MEDICATIONS:  Current Outpatient Medications  Medication Sig Dispense Refill  . Albuterol Sulfate (PROAIR RESPICLICK) 329 (90 Base) MCG/ACT AEPB Inhale 2 puffs into the lungs every 6 (six) hours as needed. 1 each 3  . azelastine (ASTELIN) 0.1 % nasal spray SMARTSIG:1 Spray(s) Both Nares Twice Daily PRN    . cetirizine (ZYRTEC) 10 MG tablet Take 10 mg by mouth daily.    Marland Kitchen doxycycline (VIBRA-TABS) 100 MG tablet doxycycline hyclate 100 mg tablet  TAKE 1 TABLET (100 MG TOTAL) BY MOUTH 2 (TWO) TIMES DAILY.    . fluticasone (FLONASE) 50 MCG/ACT nasal spray fluticasone propionate 50 mcg/actuation nasal spray,suspension    . levothyroxine (SYNTHROID) 100 MCG tablet Synthroid 100 mcg tablet  TAKE 1 TABLET (100 MCG TOTAL) BY MOUTH DAILY.    Marland Kitchen  levothyroxine (SYNTHROID, LEVOTHROID) 75 MCG tablet Take 1 tablet (75 mcg total) by mouth daily before breakfast. 90 tablet 1  . liothyronine (CYTOMEL) 5 MCG tablet TAKE 2 TABLETS (=10MCG     TOTAL)     DAILY 180 tablet 1  . montelukast (SINGULAIR) 10 MG tablet Take 10 mg by mouth daily.    Marland Kitchen triamcinolone cream (KENALOG) 0.1 % Apply daily to lupus for up to a few months     No current facility-administered medications for this visit.     REVIEW OF SYSTEMS: On review of systems, the patient reports that she is doing well overall. She denies any chest pain, shortness of breath, cough, fevers,  chills, night sweats, unintended weight changes. She denies any bowel or bladder disturbances, and denies abdominal pain, nausea or vomiting. She denies any new musculoskeletal or joint aches or pains. A complete review of systems is obtained and is otherwise negative.     PHYSICAL EXAM:  Wt Readings from Last 3 Encounters:  09/14/17 164 lb (74.4 kg)  09/11/16 161 lb 14.4 oz (73.4 kg)  04/26/16 152 lb (68.9 kg)   Temp Readings from Last 3 Encounters:  10/10/14 97.8 F (36.6 C) (Oral)  10/06/14 98.2 F (36.8 C)  07/01/12 98 F (36.7 C) (Oral)   BP Readings from Last 3 Encounters:  09/14/17 113/79  09/11/16 100/68  03/31/16 127/81   Pulse Readings from Last 3 Encounters:  09/14/17 79  09/11/16 73  03/31/16 89    In general this is a well appearing caucasian female in no acute distress. She's alert and oriented x4 and appropriate throughout the examination. Cardiopulmonary assessment is negative for acute distress and she exhibits normal effort. Bilateral breast exam is deferred.    ECOG = 0  0 - Asymptomatic (Fully active, able to carry on all predisease activities without restriction)  1 - Symptomatic but completely ambulatory (Restricted in physically strenuous activity but ambulatory and able to carry out work of a light or sedentary nature. For example, light housework, office work)  2 - Symptomatic, <50% in bed during the day (Ambulatory and capable of all self care but unable to carry out any work activities. Up and about more than 50% of waking hours)  3 - Symptomatic, >50% in bed, but not bedbound (Capable of only limited self-care, confined to bed or chair 50% or more of waking hours)  4 - Bedbound (Completely disabled. Cannot carry on any self-care. Totally confined to bed or chair)  5 - Death   Eustace Pen MM, Creech RH, Tormey DC, et al. [phone]). "Toxicity and response criteria of the [facility]". [place] Oncol. 5 (6):  649-55    LABORATORY DATA:  Lab Results  Component Value Date   WBC 7.7 09/12/2017   HGB 14.7 09/12/2017   HCT 43.2 09/12/2017   MCV 86.2 09/12/2017   PLT 255 09/12/2017   Lab Results  Component Value Date   NA 140 09/12/2017   K 4.2 09/12/2017   CL 106 09/12/2017   CO2 27 09/12/2017   Lab Results  Component Value Date   ALT 11 09/12/2017   AST 17 09/12/2017   ALKPHOS 74 03/31/2016   BILITOT 0.5 09/12/2017      RADIOGRAPHY: MM CLIP PLACEMENT RIGHT  Result Date: 07/18/2019 CLINICAL DATA:  Status post ultrasound biopsy of a RIGHT breast mass and stereotactic biopsy of RIGHT breast calcifications. EXAM: DIAGNOSTIC RIGHT MAMMOGRAM POST ULTRASOUND and STEREOTACTIC BIOPSY COMPARISON:  Outside studies including  diagnostic mammogram and ultrasound dated 07/03/2019. FINDINGS: Mammographic images were obtained following ultrasound guided biopsy of an indeterminate mass in the RIGHT breast at the 12 o'clock axis followed by stereotactic biopsy of indeterminate calcifications in the upper-outer quadrant of the RIGHT breast. The ribbon shaped clip is appropriately positioned at the expected site of the biopsy. The coil shaped biopsy clip is displaced 1.3 cm posterior-inferior to the biopsied calcifications. IMPRESSION: 1. Appropriate positioning of the ribbon shaped biopsy marking clip at the site of biopsy in the RIGHT breast at the 12 o'clock axis. 2. The coil shaped biopsy marking clip is displaced 1.3 cm posterior-inferior to the biopsied calcifications. Final Assessment: Post Procedure Mammograms for Marker Placement Electronically Signed   By: Franki Cabot M.D.   On: 07/18/2019 09:34   MM RT BREAST BX W LOC DEV 1ST LESION IMAGE BX SPEC STEREO GUIDE  Addendum Date: 07/23/2019   ADDENDUM REPORT: 07/23/2019 11:54 ADDENDUM: Pathology revealed GRADE III INVASIVE DUCTAL CARCINOMA of the RIGHT breast, 12 o'clock. This was found to be concordant by Dr. Franki Cabot. Pathology revealed DUCTAL  CARCINOMA IN SITU of the RIGHT breast, upper outer quadrant. This was found to be concordant by Dr. Franki Cabot. Pathology results were discussed with the patient by telephone. The patient reported doing well after the biopsies with tenderness at the sites. Post biopsy instructions and care were reviewed and questions were answered. The patient was encouraged to call [facility] for any additional concerns. Per patient request, the patient was referred to [facility] on Jul 30, 2019. Pathology results reported by Stacie Acres RN on 07/23/2019. Electronically Signed   By: Franki Cabot M.D.   On: 07/23/2019 11:54   Result Date: 07/23/2019 CLINICAL DATA:  Patient with indeterminate calcifications in the upper-outer quadrant the RIGHT breast presents today for stereotactic biopsy. EXAM: RIGHT BREAST STEREOTACTIC CORE NEEDLE BIOPSY COMPARISON:  Previous exams. FINDINGS: The patient and I discussed the procedure of stereotactic-guided biopsy including benefits and alternatives. We discussed the high likelihood of a successful procedure. We discussed the risks of the procedure including infection, bleeding, tissue injury, clip migration, and inadequate sampling. Informed written consent was given. The usual time out protocol was performed immediately prior to the procedure. Using sterile technique and 1% Lidocaine as local anesthetic, under stereotactic guidance, a 9 gauge vacuum assisted device was used to perform core needle biopsy of calcifications in the upper outer quadrant of the RIGHT breast using a superior approach. Specimen radiograph was performed showing calcifications. Specimens with calcifications are identified for pathology. Lesion quadrant: Upper-outer quadrant At the conclusion of the procedure, coil shaped tissue marker clip was deployed into the biopsy cavity. Follow-up 2-view mammogram was performed  and dictated separately. IMPRESSION: Stereotactic-guided biopsy of indeterminate calcifications within the upper-outer quadrant of the RIGHT breast. No apparent complications. Electronically Signed: By: Franki Cabot M.D. On: 07/18/2019 09:23   [place] RT BREAST BX W LOC DEV 1ST LESION IMG BX SPEC US GUIDE  Addendum Date: 07/23/2019   ADDENDUM REPORT: 07/23/2019 11:53 ADDENDUM: Pathology revealed GRADE III INVASIVE DUCTAL CARCINOMA of the RIGHT breast, 12 o'clock. This was found to be concordant by Dr. Franki Cabot. 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 This was found to be concordant by Dr. Franki Cabot. Pathology results were discussed with the patient by telephone. The patient reported doing well after the biopsies with tenderness at  the sites. Post biopsy instructions and care were reviewed and questions were answered. The patient was encouraged to call [facility] for any additional concerns. Per patient request, the patient was referred to [facility] on Jul 30, 2019. Pathology results reported by Stacie Acres RN on 07/23/2019. Electronically Signed   By: Franki Cabot M.D.   On: 07/23/2019 11:53   Result Date: 07/23/2019 CLINICAL DATA:  Patient with an indeterminate mass in the RIGHT breast at the 12 o'clock axis presents today for ultrasound-guided core biopsy. Patient has additional indeterminate calcifications within the upper RIGHT breast for which a stereotactic biopsy will be performed later today. EXAM: ULTRASOUND GUIDED RIGHT BREAST CORE NEEDLE BIOPSY COMPARISON:  Previous exam(s). PROCEDURE: I met with the patient and we discussed the procedure of ultrasound-guided biopsy, including benefits and alternatives. We discussed the high likelihood of a successful procedure. We discussed the risks of the procedure, including infection, bleeding, tissue  injury, clip migration, and inadequate sampling. Informed written consent was given. The usual time-out protocol was performed immediately prior to the procedure. Lesion quadrant: 12 o'clock Using sterile technique and 1% Lidocaine as local anesthetic, under direct ultrasound visualization, a 12 gauge spring-loaded device was used to perform biopsy of the RIGHT breast mass at the 12 o'clock axis using a medial approach. At the conclusion of the procedure ribbon shaped tissue marker clip was deployed into the biopsy cavity. Follow up 2 view mammogram was performed and dictated separately. IMPRESSION: Ultrasound guided biopsy of the RIGHT breast mass at the 12 o'clock axis. No apparent complications. Electronically Signed: By: Franki Cabot M.D. On: 07/18/2019 08:58       IMPRESSION/PLAN: 1. 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 Dr. Lisbeth Renshaw discusses the pathology findings and reviews the nature of HER-2 amplified breast disease. The consensus from the breast conference includes an MRI of the breast to determine extent which can help determine surgical approach as she would be a candidate for either breast conservation with lumpectomy with sentinel node biopsy versus mastectomy and sentinel biopsy.  Given the HER-2 component of her disease she would benefit from chemotherapy and targeted anti-HER-2 therapy as well as antiestrogen therapy. After further discussion with Dr. Barry Dienes, she has decided to proceed with bilateral mastectomies. Dr. Lisbeth Renshaw spent time discussing the scenarios for which radiotherapy following mastectomy may be given. At this time however, she does not appear to have clinical features that would be suspicious for needing postmastectomy radiation, but we would follow up with the results of her final pathology which will determine this. We did discuss radiation in the broad sense as well as the risks, benefits, short, and  long term effects of radiotherapy and  the delivery and logistics of radiotherapy as well as the course of 6 1/2 weeks if she needed postmastectomy radiation. We will follow along through breast conference with her course.   In a visit lasting 60 minutes, greater than 50% of the time was spent face to face reviewing her case, as well as in preparation of, discussing, and coordinating the patient's care.  The above documentation reflects my direct findings during this shared patient visit. Please see the separate note by Dr. Lisbeth Renshaw on this date for the remainder of the patient's plan of care.   ] You may Carola Rhine, [facility]

## 2019-07-31 ENCOUNTER — Encounter: Payer: Self-pay | Admitting: *Deleted

## 2019-07-31 ENCOUNTER — Encounter: Payer: Self-pay | Admitting: General Practice

## 2019-07-31 ENCOUNTER — Telehealth: Payer: Self-pay | Admitting: *Deleted

## 2019-07-31 ENCOUNTER — Ambulatory Visit
Admission: RE | Admit: 2019-07-31 | Discharge: 2019-07-31 | Disposition: A | Discharge status: Home / Self Care | Payer: BC Managed Care – PPO | Source: Ambulatory Visit | Attending: Hematology | Admitting: Hematology

## 2019-07-31 DIAGNOSIS — Z8481 Family history of carrier of genetic disease: Secondary | ICD-10-CM | POA: Insufficient documentation

## 2019-07-31 DIAGNOSIS — Z170 Estrogen receptor positive status [ER+]: Secondary | ICD-10-CM

## 2019-07-31 DIAGNOSIS — C50411 Malignant neoplasm of upper-outer quadrant of right female breast: Secondary | ICD-10-CM

## 2019-07-31 NOTE — Telephone Encounter (Signed)
Spoke to pt concerning BMDC from 5/5. Denies questions or concerns regarding dx or treatment care plan. Pt relate she had a panic attack during MRI and had to r/s. New appt date is 5/17. Physician team notified. Confirmed future appts. Encourage pt to call with needs. Received verbal understanding.

## 2019-07-31 NOTE — Progress Notes (Signed)
[place] Psychosocial Distress Screening Spiritual Care  Missed Camaryn at [facility], so left voicemail encouraging callback in order to introduce [facility] team/resources, reviewing distress screen per protocol.  The patient scored a 5 on the Psychosocial Distress Thermometer which indicates moderate distress. Also assessed for distress and other psychosocial needs.   ONCBCN DISTRESS SCREENING 07/31/2019  Screening Type Initial Screening  Distress experienced in past week (1-10) 5  Family Problem type Other (comment)  Emotional problem type Nervousness/Anxiety  Physical Problem type Breathing  Referral to support programs Yes    Follow up needed: Yes.   Ms Basquez received full packet of [place]) team/programming information at [facility]. LVM encouraging callback, but will also phone again.   [place], [place], [facility] Pager [phone] Voicemail [phone]

## 2019-08-01 ENCOUNTER — Other Ambulatory Visit: Payer: Self-pay

## 2019-08-01 ENCOUNTER — Encounter: Payer: Self-pay | Admitting: General Practice

## 2019-08-01 ENCOUNTER — Ambulatory Visit
Admission: RE | Admit: 2019-08-01 | Discharge: 2019-08-01 | Disposition: A | Discharge status: Home / Self Care | Payer: BC Managed Care – PPO | Source: Ambulatory Visit | Attending: Hematology | Admitting: Hematology

## 2019-08-01 IMAGING — MR MR BREAST BILAT WO/W CM
8 of 12 series · 31 of 48 positions shown · IV contrast (7 ML GADAVIST)
Comparison: [DATE] and earlier

CLINICAL DATA: Staging for recently diagnosed breast cancer.
Stereotactic guided core biopsy of calcifications in the UPPER-OUTER
QUADRANT of the RIGHT breast demonstrated ductal carcinoma in situ,
marked by a coil shaped clip. Ultrasound-guided core biopsy of mass
in the 12 o'clock location of the RIGHT breast shows grade 3
invasive ductal carcinoma, marked by a ribbon shaped clip. Family
history of breast cancer diagnosed in her mother in her 70s and
great aunt in her 40s.

LABS:  None obtained at the time of imaging.
EXAM:
BILATERAL BREAST MRI WITH AND WITHOUT CONTRAST
TECHNIQUE: Multiplanar, multisequence MR images of both breasts were obtained
prior to and following the intravenous administration of 7 ml of
Gadavist

[Series 2: t2_tirm_tra ipat (a-p) · axial · 3.0mm · 0.70mm/px · 1 of 55 slices shown]
[im 1/55]
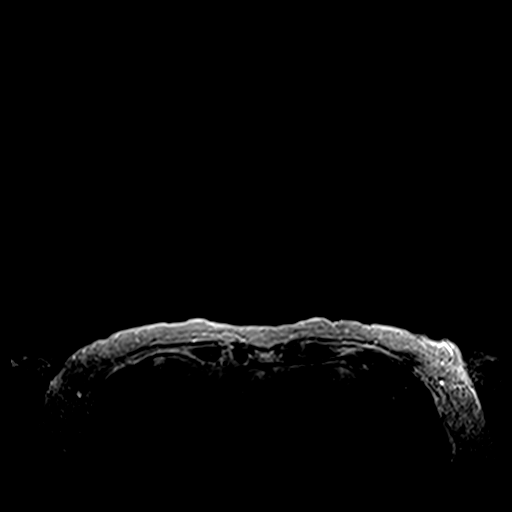

[Series 3: fl3d pre-cm no · axial · non-contrast · 1.2mm · 0.94mm/px · z∈[-55,+116]mm · 5 of 144 slices shown]
[im 1/144]
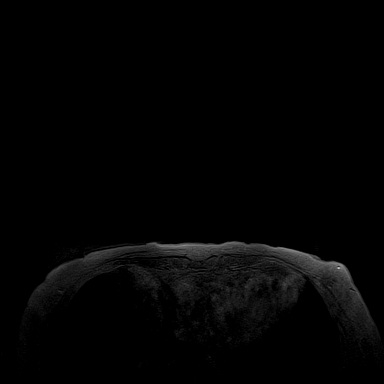
[im 36/144]
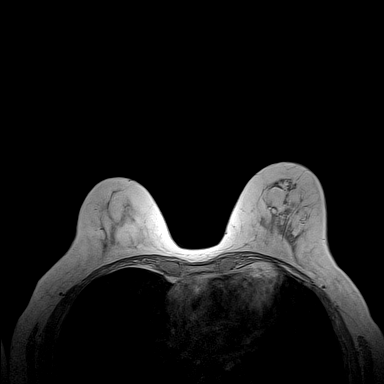
[im 72/144]
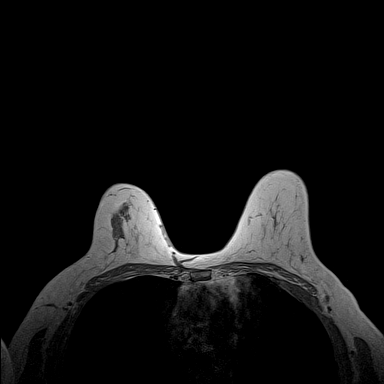
[im 108/144]
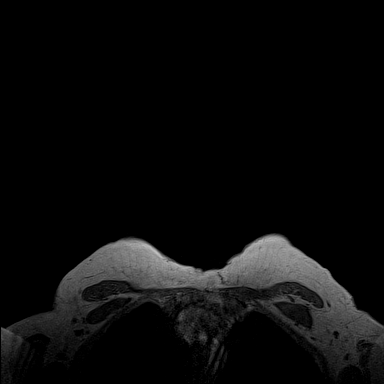
[im 144/144]
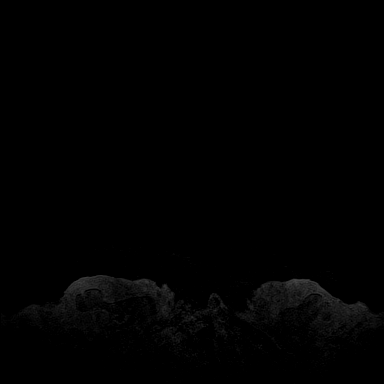

[Series 4: fl3d pre-cm · axial · non-contrast · 1.2mm · 0.94mm/px · z∈[-55,+116]mm · 5 of 144 slices shown]
[im 1/144]
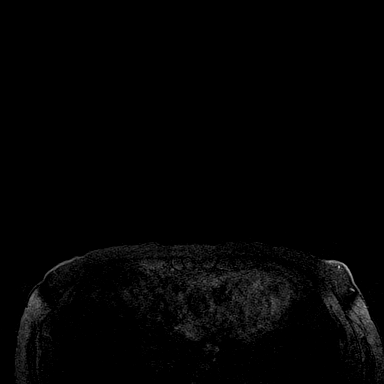
[im 36/144]
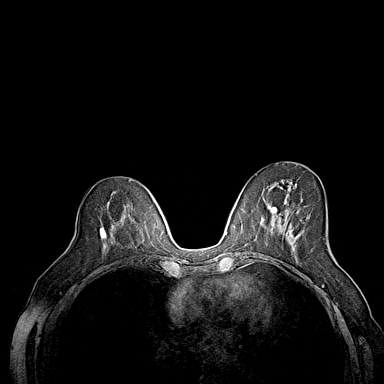
[im 72/144]
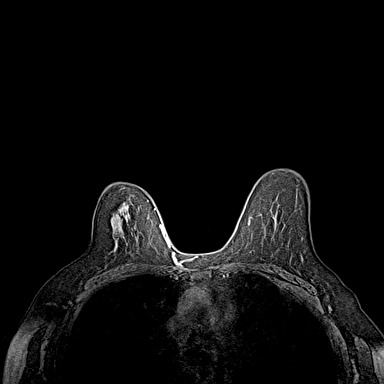
[im 108/144]
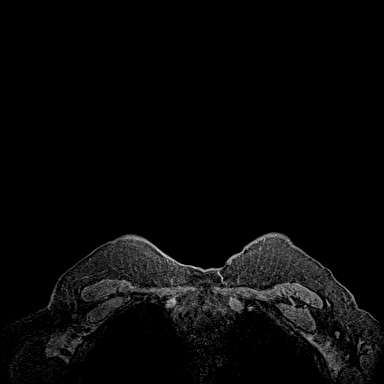
[im 144/144]
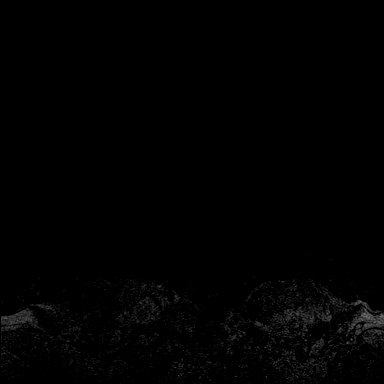

[Series 5: fl3d post-cm 20 · axial · 1.2mm · 0.94mm/px · z∈[-55,+116]mm · 5 of 144 slices shown (1 of 3)]
[im 1/144]
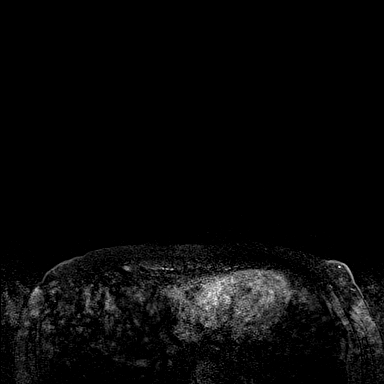
[im 36/144]
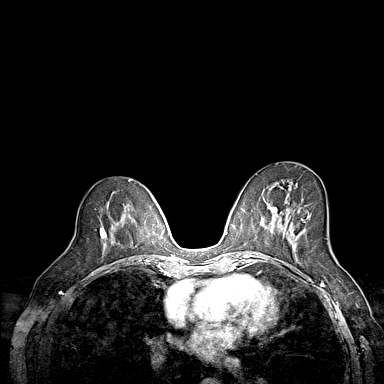
[im 72/144]
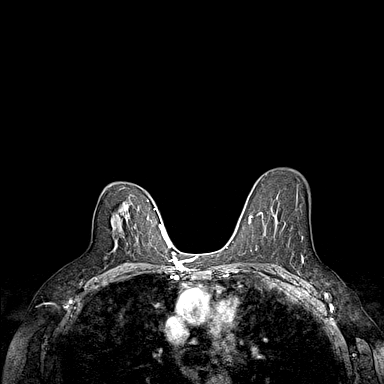
[im 108/144]
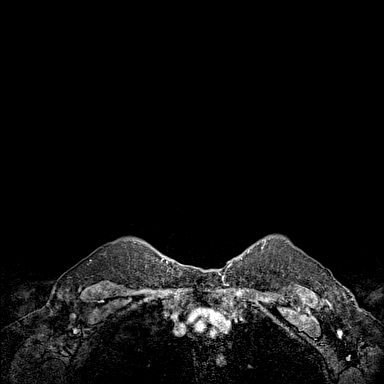
[im 144/144]
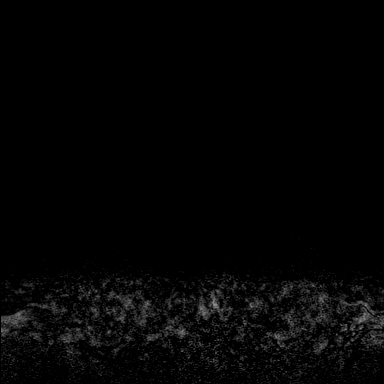

[Series 6: fl3d post-cm 20 · axial · 1.2mm · 0.94mm/px · z∈[-55,+116]mm · 5 of 144 slices shown (2 of 3)]
[im 1/144]
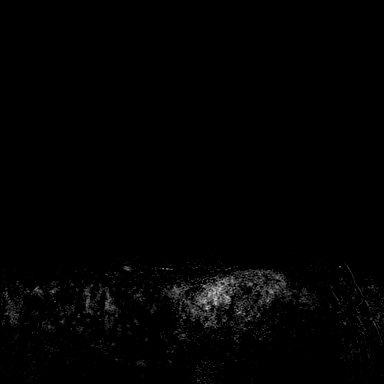
[im 36/144]
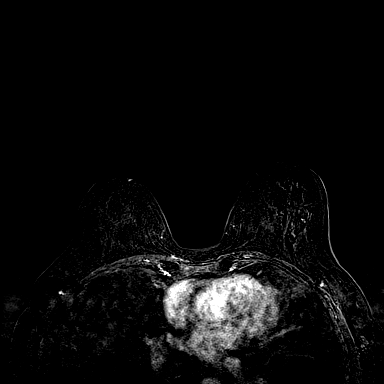
[im 72/144]
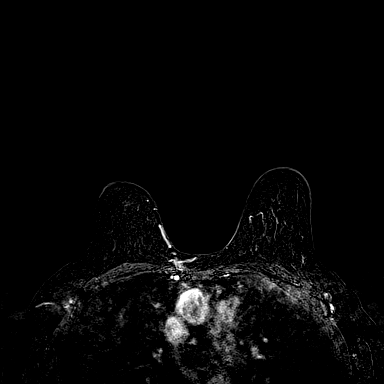
[im 108/144]
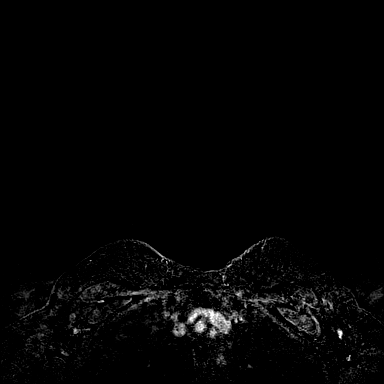
[im 144/144]
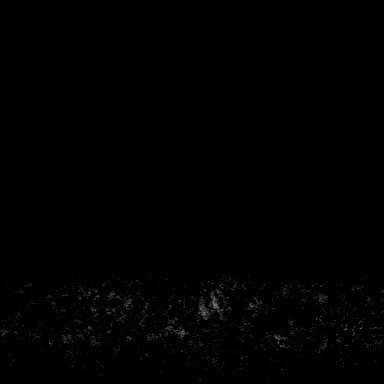

[Series 7: fl3d post-cm 20 · axial · 172.8mm · 0.94mm/px · 1 of 1 slices shown (3 of 3)]
[im 1/1]
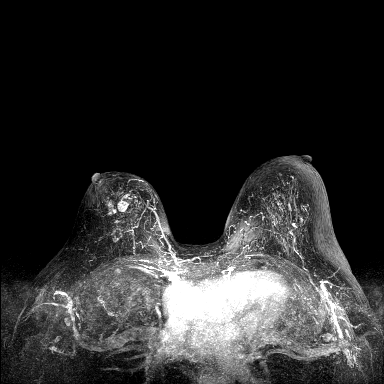

[Series 8: fl3d post-cm 3min · axial · 1.2mm · 0.94mm/px · z∈[-55,+116]mm · 6 of 144 slices shown]
[im 1/144]
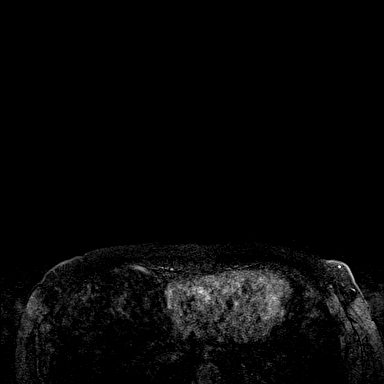
[im 29/144]
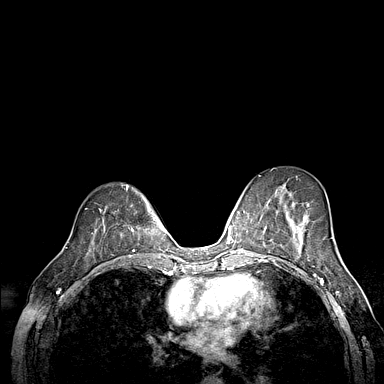
[im 58/144]
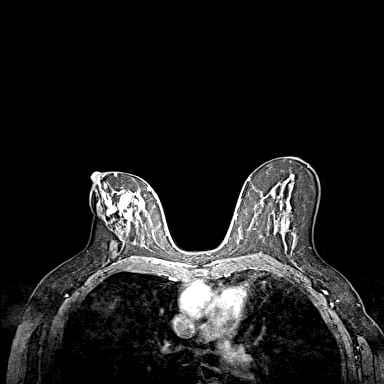
[im 86/144]
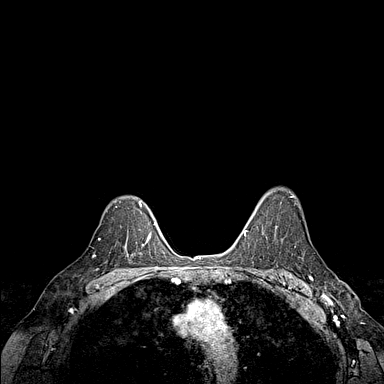
[im 115/144]
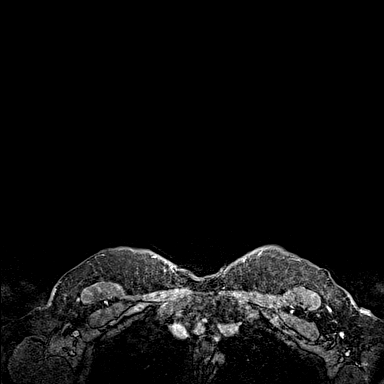
[im 144/144]
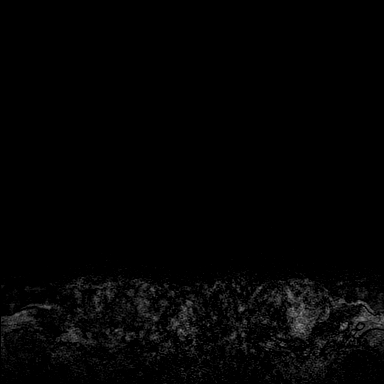

[Series 9: fl3d post-cm 3min_sub · axial · 1.2mm · 0.94mm/px · z∈[-55,+13]mm · 3 of 144 slices shown]
[im 1/144]
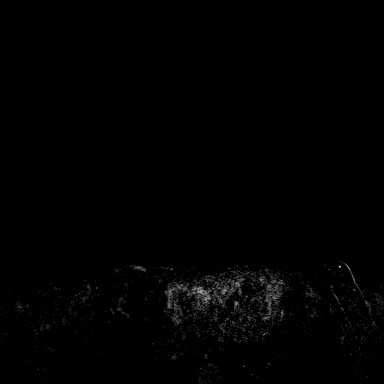
[im 29/144]
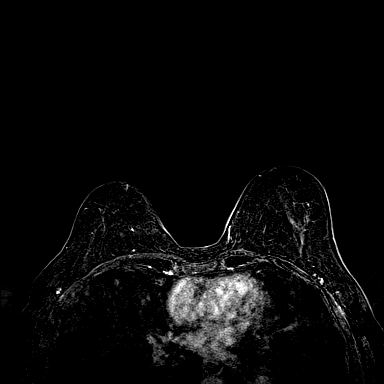
[im 58/144]
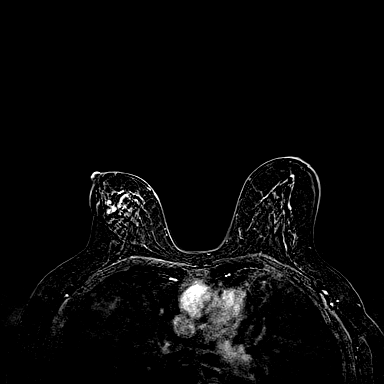

[31 of 48 positions shown; findings below may reference images not displayed]

Three-dimensional MR images were rendered by post-processing of the
original MR data on an independent workstation. The
three-dimensional MR images were interpreted, and findings are
reported in the following complete MRI report for this study. Three
dimensional images were evaluated at the independent [employer]
workstation
FINDINGS: Breast composition: b. Scattered fibroglandular tissue.

Background parenchymal enhancement: Minimal

Right breast: Enhancing mass with washout kinetics contains artifact
from tissue marker clip in the central retroareolar region of the
RIGHT breast, consistent with known grade 3 invasive ductal
carcinoma in the 12 o'clock location. This mass measures 1.1 x
centimeters by MRI. (Image 83 of series 6).

Just LATERAL to this discrete mass, there is focal non mass
enhancement measuring 1.5 x 0.9 x 2.3 centimeters. Enhancement is
persistent. Artifact from tissue marker clip is identified within
this non mass enhancement, consistent with recent stereotactic
guided core biopsy showing ductal carcinoma in situ. (Image 87 of
series 12)

In the UPPER-OUTER QUADRANT of the RIGHT breast there is a rim
enhancing mass measuring 1.5 x 1.4 centimeters. This mass is
centimeter superior to the non mass enhancement. Mass shows
persistent enhancement kinetics. (Image 67 of series 12). This mass
correlates with distortion seen in the UPPER-OUTER QUADRANT of the
RIGHT breast on recent mammographic images.

Left breast: No mass or abnormal enhancement.

Lymph nodes: No abnormal appearing lymph nodes.

Ancillary findings:  None.
IMPRESSION: 1. Known invasive ductal carcinoma measures 1.2 centimeters in the
12 o'clock location.
2. Non mass enhancement measures 2.3 centimeters in the LATERAL
portion of the RIGHT breast corresponding to DCIS on biopsy.
3. An additional mass in the UPPER OUTER QUADRANT of the RIGHT
breast is 1.5 centimeters and warrants tissue diagnosis. This area
correlates with distortion seen mammographically.
4. No axillary adenopathy or ancillary findings. LEFT breast is
negative.

RECOMMENDATION:
1. Stereotactic guided core biopsy of distortion in the UPPER-OUTER
QUADRANT of the RIGHT breast.
2. Treatment plan for known RIGHT breast cancer.

BI-RADS CATEGORY  6: Known biopsy-proven malignancy.

## 2019-08-01 MED ORDER — GADOBUTROL 1 MMOL/ML IV SOLN
7.0000 mL | Freq: Once | INTRAVENOUS | Status: AC | PRN
  Administered 2019-08-01: 7 mL via INTRAVENOUS

## 2019-08-01 NOTE — Progress Notes (Signed)
[facility] Spiritual Care Note  Reached Stephanie Chase by phone for [facility] f/u and emotional support. She notes that she is particularly anxious about having to face chemo treatments alone because of covid protocol. She welcomes Pharmacist, hospital referral. Will place today. At this time she is working hard to process all the information she has received and prefers to reach out to [facility] as needed. She is aware of ongoing Support Team/programming availability.   [place], [place], [facility] Pager [phone] Voicemail [phone]

## 2019-08-04 ENCOUNTER — Ambulatory Visit (HOSPITAL_COMMUNITY)
Admission: RE | Admit: 2019-08-04 | Discharge: 2019-08-04 | Disposition: A | Discharge status: Home / Self Care | Payer: BC Managed Care – PPO | Source: Ambulatory Visit | Attending: Hematology | Admitting: Hematology

## 2019-08-04 ENCOUNTER — Other Ambulatory Visit: Payer: Self-pay

## 2019-08-04 DIAGNOSIS — Z01818 Encounter for other preprocedural examination: Secondary | ICD-10-CM | POA: Insufficient documentation

## 2019-08-04 DIAGNOSIS — Z170 Estrogen receptor positive status [ER+]: Secondary | ICD-10-CM | POA: Insufficient documentation

## 2019-08-04 DIAGNOSIS — F17200 Nicotine dependence, unspecified, uncomplicated: Secondary | ICD-10-CM | POA: Insufficient documentation

## 2019-08-04 DIAGNOSIS — I071 Rheumatic tricuspid insufficiency: Secondary | ICD-10-CM | POA: Diagnosis not present

## 2019-08-04 DIAGNOSIS — C50411 Malignant neoplasm of upper-outer quadrant of right female breast: Secondary | ICD-10-CM | POA: Diagnosis not present

## 2019-08-04 DIAGNOSIS — E079 Disorder of thyroid, unspecified: Secondary | ICD-10-CM | POA: Insufficient documentation

## 2019-08-04 NOTE — Progress Notes (Signed)
  Echocardiogram 2D Echocardiogram has been performed.  Darlina Sicilian M 08/04/2019, 11:04 AM

## 2019-08-05 ENCOUNTER — Encounter: Payer: Self-pay | Admitting: *Deleted

## 2019-08-05 ENCOUNTER — Telehealth: Payer: Self-pay | Admitting: *Deleted

## 2019-08-05 NOTE — Telephone Encounter (Signed)
Spoke to pt regarding sx decision. Pt has decided to have bil mastectomy w/port placement and delayed reconstruction. Physician team notified.  Pt sister will come up from [facility] to stay with pt after surgery.

## 2019-08-06 ENCOUNTER — Other Ambulatory Visit: Payer: Self-pay | Admitting: General Surgery

## 2019-08-06 ENCOUNTER — Telehealth: Payer: Self-pay

## 2019-08-06 NOTE — Telephone Encounter (Signed)
Nutrition Assessment  Reason for Assessment:  Pt attended Breast Clinic on 07/30/19 and was given nutrition packet by nurse navigator.  ASSESSMENT:  59 year old female with new diagnosis of breast cancer.  Patient planning bilateral mastectomies, adjuvant chemotherapy, radiation and antiestrogens.  Spoke with patient via phone to introduce self and service at [facility].  Patient with questions regarding nutrition  Medications:  reviewed  Labs: reviewed  Anthropometrics:   Height: 65 inches Weight: 159 lb 9.6 oz BMI: 26   NUTRITION DIAGNOSIS: Food and nutrition related knowledge deficit related to new diagnosis of breast cancer as evidenced by no prior need for nutrition related information.  INTERVENTION:   Discussed briefly packet of information regarding nutritional tips for breast cancer patients.  Questions answered.  Teachback method used.  Contact information provided and patient knows to contact me with questions/concerns.    MONITORING, EVALUATION, and GOAL: Pt will consume a healthy plant based diet to maintain lean body mass throughout treatment.   Marquie Aderhold B. Zenia Resides, [place], [place] Registered Dietitian [phone] (pager)

## 2019-08-11 ENCOUNTER — Other Ambulatory Visit: Payer: BC Managed Care – PPO

## 2019-08-12 ENCOUNTER — Encounter: Payer: Self-pay | Admitting: *Deleted

## 2019-08-12 ENCOUNTER — Ambulatory Visit: Payer: Self-pay | Admitting: Genetic Counselor

## 2019-08-12 ENCOUNTER — Encounter: Payer: Self-pay | Admitting: Genetic Counselor

## 2019-08-12 ENCOUNTER — Telehealth: Payer: Self-pay | Admitting: Genetic Counselor

## 2019-08-12 DIAGNOSIS — Z1379 Encounter for other screening for genetic and chromosomal anomalies: Secondary | ICD-10-CM

## 2019-08-12 HISTORY — DX: Encounter for other screening for genetic and chromosomal anomalies: Z13.79

## 2019-08-12 NOTE — Telephone Encounter (Signed)
Revealed negative genetic testing. Discussed that we do not know why she has breast cancer or why there is cancer in the family. There could be a genetic mutation in the family that Stephanie Chase did not inherit. There could also be a mutation in a different gene that we are not testing, or our current technology may not be able detect certain mutations. It will therefore be important for Stephanie Chase to stay in contact with genetics to keep up with whether additional testing may be appropriate in the future.  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 Her result is still considered normal at this time and should not impact her medical management.

## 2019-08-12 NOTE — Progress Notes (Signed)
HPI:  [name] was previously seen in the [facility] due to a personal and family history of breast cancer and concerns regarding a hereditary predisposition to cancer. Please refer to our prior cancer genetics clinic note for more information regarding our discussion, assessment and recommendations, at the time. [name] recent genetic test results were disclosed to her, as were recommendations warranted by these results. These results and recommendations are discussed in more detail below.  CANCER HISTORY:  Oncology History Overview Note  Cancer Staging Malignant neoplasm of upper-outer quadrant of right breast in female, estrogen receptor positive ([place]) Staging form: Breast, AJCC 8th Edition - Clinical stage from 07/30/2019: Stage IA (cT1c, cN0, cM0, G3, ER+, PR-, HER2+) - Unsigned    Malignant neoplasm of upper-outer quadrant of right breast in female, estrogen receptor positive ([place])  07/03/2019 Mammogram   Diagnostic Mammogram  IMPRESSION There is a 1.3x1.1cm focal asymmetry in the retroareolar anterior to middle depth right breast 2.2cm from the nipple.  There is a 2 cm focal asymmetry with appearance in the upper outer quadrant posterior depth of right breast 3 cm from nipple -Both suspicious and biopsy recommended.     07/18/2019 Initial Biopsy   Diagnosis 07/18/19 1. Breast, right, needle core biopsy, 12 o'clock - INVASIVE DUCTAL CARCINOMA. 2. Breast, right, needle core biopsy, upper outer quadrant - DUCTAL CARCINOMA IN SITU. Microscopic Comment 1. The invasive carcinoma is nuclear grade 3. The greatest linear extent of tumor in any one core is 11 mm. Ancillary studies will be reported separately. CKAE1AE3, GATA-3, ER and E-cadherin are positive. PAX 8 is negative. CK7 is noncontributory. 2. The in situ carcinoma is high nuclear grade with central necrosis and calcifications. E-cadherin is positive. P63, Calponin and SMM-1 demonstrate the present of  myoepithelium.   07/18/2019 Receptors her2   1. PROGNOSTIC INDICATORS 07/18/19 Results: IMMUNOHISTOCHEMICAL AND MORPHOMETRIC ANALYSIS PERFORMED MANUALLY The tumor cells are POSITIVE for Her2 (3+). Of note, the tumor shows heterogeneity in regards to Her2 expression. Estrogen Receptor: 95%, POSITIVE, STRONG STAINING INTENSITY Progesterone Receptor: 0%, NEGATIVE Proliferation Marker Ki67: 50%   07/24/2019 Initial Diagnosis   Malignant neoplasm of upper-outer quadrant of right breast in female, estrogen receptor positive ([name])   08/10/2019 Genetic Testing   Negative genetic testing:  No pathogenic variants detected on the [facility] Breast Cancer STAT panel + Common Hereditary Cancers panel. A variant of uncertain significance (VUS) was detected in the ATM gene called c.3834C>A. The report date is 08/10/2019.  The Breast Cancer STAT Panel offered by [facility] includes sequencing and deletion/duplication analysis for the following 9 genes:  ATM, BRCA1, BRCA2, CDH1, CHEK2, PALB2, PTEN, STK11 and TP53. The Common Hereditary Cancers Panel offered by [facility] includes sequencing and/or deletion duplication testing of the following 48 genes: APC, ATM, AXIN2, BARD1, BMPR1A, BRCA1, BRCA2, BRIP1, CDH1, CDK4, CDKN2A (p14ARF), CDKN2A (p16INK4a), CHEK2, CTNNA1, DICER1, EPCAM (Deletion/duplication testing only), GREM1 (promoter region deletion/duplication testing only), KIT, MEN1, MLH1, MSH2, MSH3, MSH6, MUTYH, NBN, NF1, NHTL1, PALB2, PDGFRA, PMS2, POLD1, POLE, PTEN, RAD50, RAD51C, RAD51D, RNF43, SDHB, SDHC, SDHD, SMAD4, SMARCA4. STK11, TP53, TSC1, TSC2, and VHL.  The following genes were evaluated for sequence changes only: SDHA and HOXB13 c.251G>A variant only.      FAMILY HISTORY:  We obtained a detailed, 4-generation family history.  Significant diagnoses are listed below: Family History  Problem Relation Age of Onset   Breast cancer Mother 12   Diabetes Mother    Bladder Cancer Mother 26  ureteral  cancer   Cancer Mother        blood cancer   Heart attack Father    Hyperlipidemia Father    Hypertension Father    Alzheimer's disease Father    Prostate cancer Father        dx. >50   Alzheimer's disease Other    Cancer Maternal Uncle        prostate or colon   BRCA 1/2 Daughter    Breast cancer Other 44       maternal great-aunt   Cancer Cousin        unknown type; dx. in her 66s (maternal cousin)      [name] has two daughters - Stephanie Chase (age 60) and Stephanie Chase (age 63). Neither have had cancer, although Stephanie Chase has had positive BRCA testing (report not available for review). [name] has two paternal half-brothers, two paternal half-sisters, a maternal half-brother, and a maternal half-sister. She does not know if any of these siblings have had cancer.  [name] mother is 59 and has a history of breast cancer at age 15, bladder/ureteral cancer at age 65, and blood cancer. Her mother is in the process of having BRCA1/2 testing. [name] has three maternal uncles and three maternal aunts. One uncle had either prostate or colon cancer diagnosed at an unknown age. Her maternal grandparents died when they were older than 55. [name] maternal great-aunt (grandmother's sister) had breast cancer in her 92s. This great-aunt has two grandchildren with cancer - one with breast cancer in her 48s, and one with esophageal cancer in his 71s. There are no other known diagnoses of cancer on the maternal side of the family.  [name] father died at the age of 12 and had a history of prostate cancer diagnosed when he was older than 23, although he did not die from prostate cancer. She had two paternal uncles and one paternal aunt. Both uncles had either prostate or colon cancer and died when they were older than 26. Her paternal grandparents died when they were older than 45. There are no other known diagnoses of cancer on the paternal side of the family.  [name] is  aware of previous family history of genetic testing for hereditary cancer risks - her daughter is BRCA positive and her mother is in the progress of having BRCA testing. Her maternal ancestors are of [place] descent, and paternal ancestors are of [place] and [place] descent. There is no reported Ashkenazi Jewish ancestry. There is no known consanguinity.  GENETIC TEST RESULTS: Genetic testing reported out on 08/10/2019 through the [facility] Breast Cancer STAT panel + Common Hereditary Cancers panel. No pathogenic variants were detected.   The Breast Cancer STAT Panel offered by [facility] includes sequencing and deletion/duplication analysis for the following 9 genes:  ATM, BRCA1, BRCA2, CDH1, CHEK2, PALB2, PTEN, STK11 and TP53. The Common Hereditary Cancers Panel offered by [facility] includes sequencing and/or deletion duplication testing of the following 48 genes: APC, ATM, AXIN2, BARD1, BMPR1A, BRCA1, BRCA2, BRIP1, CDH1, CDK4, CDKN2A (p14ARF), CDKN2A (p16INK4a), CHEK2, CTNNA1, DICER1, EPCAM (Deletion/duplication testing only), GREM1 (promoter region deletion/duplication testing only), KIT, MEN1, MLH1, MSH2, MSH3, MSH6, MUTYH, NBN, NF1, NHTL1, PALB2, PDGFRA, PMS2, POLD1, POLE, PTEN, RAD50, RAD51C, RAD51D, RNF43, SDHB, SDHC, SDHD, SMAD4, SMARCA4. STK11, TP53, TSC1, TSC2, and VHL.  The following genes were evaluated for sequence changes only: SDHA and HOXB13 c.251G>A variant only. The test report will be scanned into EPIC and located under the Molecular Pathology  section of the Results Review tab.  A portion of the result report is included below for reference.     We discussed with [name] that because current genetic testing is not perfect, it is possible there may be a gene mutation in one of these genes that current testing cannot detect, but that chance is small.  We also discussed, that there could be another gene that has not yet been discovered, or that we have not yet tested, that is responsible for the  cancer diagnoses in the family. It is also possible there is a hereditary cause for the cancer in the family that [name] did not inherit and therefore was not identified in her testing. Therefore, it is important to remain in touch with cancer genetics in the future so that we can continue to offer [name] the most up to date genetic testing.   Genetic testing did identify a variant of uncertain significance (VUS) in the ATM gene called c.3834C>A. At this time, it is unknown if this variant is associated with increased cancer risk or if this is a normal finding, but most variants such as this get reclassified to being inconsequential. It should not be used to make medical management decisions. With time, we suspect the lab will determine the significance of this variant, if any. If we do learn more about it, we will try to contact [name] to discuss it further. However, it is important to stay in touch with [place] periodically and keep the address and phone number up to date.  CANCER SCREENING RECOMMENDATIONS: [name] test result is considered negative (normal).  This means that we have not identified a hereditary cause for her personal and family history of cancer at this time. While reassuring, this does not definitively rule out a hereditary predisposition to cancer. It is still possible that there could be genetic mutations that are undetectable by current technology. There could be genetic mutations in genes that have not been tested or identified to increase cancer risk. Therefore, it is recommended she continue to follow the cancer management and screening guidelines provided by her oncology and primary healthcare providers.   An individual's cancer risk and medical management are not determined by genetic test results alone. Overall cancer risk assessment incorporates additional factors, including personal medical history, family history, and any available genetic information that may  result in a personalized plan for cancer prevention and surveillance.  RECOMMENDATIONS FOR FAMILY MEMBERS:  Individuals in this family might be at some increased risk of developing cancer, over the general population risk, simply due to the family history of cancer.  We recommended women in this family have a yearly mammogram beginning at age 89, or 39 years younger than the earliest onset of cancer, an annual clinical breast exam, and perform monthly breast self-exams. Women in this family should also have a gynecological exam as recommended by their primary provider. All family members should have a colonoscopy by age 42-50.  Ms. [place]ison daughter, Stephanie Chase, previously had genetic testing that detected a BRCA gene mutation. We discussed that this mutation was likely inherited from Erica's father's side of the family. We strongly recommend that [name]'s daughter have follow-up genetic counseling regarding this genetic mutation to ensure that she understands her cancer risks and screening/risk-reduction options.    FOLLOW-UP: Lastly, we discussed with [name] that cancer genetics is a rapidly advancing field and it is possible that new genetic tests will be appropriate for her and/or her  family members in the future. We encouraged her to remain in contact with cancer genetics on an annual basis so we can update her personal and family histories and let her know of advances in cancer genetics that may benefit this family.   Our contact number was provided. [name] questions were answered to her satisfaction, and she knows she is welcome to call us at anytime with additional questions or concerns.   Clint Guy, MS, [facility] Genetic Counselor [email or web address] Phone: [phone]

## 2019-08-13 ENCOUNTER — Telehealth: Payer: Self-pay | Admitting: *Deleted

## 2019-08-13 ENCOUNTER — Encounter: Payer: Self-pay | Admitting: *Deleted

## 2019-08-13 ENCOUNTER — Other Ambulatory Visit: Payer: Self-pay | Admitting: General Surgery

## 2019-08-13 ENCOUNTER — Telehealth: Payer: Self-pay | Admitting: Hematology

## 2019-08-13 DIAGNOSIS — C50011 Malignant neoplasm of nipple and areola, right female breast: Secondary | ICD-10-CM

## 2019-08-13 DIAGNOSIS — Z170 Estrogen receptor positive status [ER+]: Secondary | ICD-10-CM

## 2019-08-13 NOTE — Telephone Encounter (Signed)
Scheduled appt per 5/19 sch message - pt is aware of appts added.

## 2019-08-13 NOTE — Telephone Encounter (Signed)
Called pt and confirmed sx date as well as f/u with Dr. Burr Medico and chemo class. Denies needs or questions at this time.

## 2019-08-15 ENCOUNTER — Other Ambulatory Visit: Payer: Self-pay

## 2019-08-20 ENCOUNTER — Telehealth: Payer: Self-pay | Admitting: Genetic Counselor

## 2019-08-20 ENCOUNTER — Encounter: Payer: Self-pay | Admitting: Hematology

## 2019-08-20 NOTE — Telephone Encounter (Signed)
Returned Stephanie Chase's phone call regarding her genetic results. Her OB/GYN, Dr. Katharine Look Rivard, is requesting a copy of her results. We will route our most recent genetic counseling note to Dr. Boyd Kerbs office.

## 2019-08-21 ENCOUNTER — Encounter: Payer: Self-pay | Admitting: Hematology

## 2019-08-27 NOTE — Progress Notes (Signed)
[id number] - [place], [address] AT [address] [place] [address] Phone: [phone] Fax: [phone]    Your procedure is scheduled on Friday, June 11th.  Report to [facility] Main Entrance "A" at 5:30 A.M., and check in at the Admitting office.  Call this number if you have problems the morning of surgery:  [phone]  Call [phone] if you have any questions prior to your surgery date Monday-Friday 8am-4pm   Remember:  Do not eat after midnight the night before your surgery  You may drink clear liquids until 4:30 A.M. the morning of your surgery.   Clear liquids allowed are: Water, Non-Citrus Juices (without pulp), Carbonated Beverages, Clear Tea, Black Coffee Only, and Gatorade   Please complete your PRE-SURGERY ENSURE that was provided to you 4:30 A.M. them morning of surgery.  Please, if able, drink it in one setting. DO NOT SIP.   Take these medicines the morning of surgery with A SIP OF WATER  levothyroxine (SYNTHROID)  liothyronine (CYTOMEL)   If needed - acetaminophen (TYLENOL), Albuterol Sulfate (PROAIR RESPICLICK)/inhaler, ALPRAZolam (XANAX), cetirizine (ZYRTEC), fluticasone (FLONASE)/nasal spray  As of today, STOP taking any Aspirin (unless otherwise instructed by your surgeon) and Aspirin containing products, Aleve, Naproxen, Ibuprofen, Motrin, Advil, Goody's, BC's, all herbal medications, fish oil, and all vitamins.                     Do not wear jewelry, make up, or nail polish            Do not wear lotions, powders, perfumes, or deodorant.            Do not shave 48 hours prior to surgery.              Do not bring valuables to the hospital.            [facility] is not responsible for any belongings or valuables.  Do NOT Smoke (Tobacco/Vapping) or drink Alcohol 24 hours prior to your procedure If you use a CPAP at night, you may bring all equipment for your overnight  stay.   Contacts, glasses, dentures or bridgework may not be worn into surgery.      For patients admitted to the hospital, discharge time will be determined by your treatment team.   Patients discharged the day of surgery will not be allowed to drive home, and someone needs to stay with them for 24 hours.  Special instructions:   [place]- Preparing For Surgery  Before surgery, you can play an important role. Because skin is not sterile, your skin needs to be as free of germs as possible. You can reduce the number of germs on your skin by washing with CHG (chlorahexidine gluconate) Soap before surgery.  CHG is an antiseptic cleaner which kills germs and bonds with the skin to continue killing germs even after washing.    Oral Hygiene is also important to reduce your risk of infection.  Remember - BRUSH YOUR TEETH THE MORNING OF SURGERY WITH YOUR REGULAR TOOTHPASTE  Please do not use if you have an allergy to CHG or antibacterial soaps. If your skin becomes reddened/irritated stop using the CHG.  Do not shave (including legs and underarms) for at least 48 hours prior to first CHG shower. It is OK to shave your face.  Please follow these instructions carefully.   1. Shower the NIGHT BEFORE SURGERY and the  MORNING OF SURGERY with CHG Soap.   2. If you chose to wash your hair, wash your hair first as usual with your normal shampoo.  3. After you shampoo, rinse your hair and body thoroughly to remove the shampoo.  4. Use CHG as you would any other liquid soap. You can apply CHG directly to the skin and wash gently with a scrungie or a clean washcloth.   5. Apply the CHG Soap to your body ONLY FROM THE NECK DOWN.  Do not use on open wounds or open sores. Avoid contact with your eyes, ears, mouth and genitals (private parts). Wash Face and genitals (private parts)  with your normal soap.   6. Wash thoroughly, paying special attention to the area where your surgery will be  performed.  7. Thoroughly rinse your body with warm water from the neck down.  8. DO NOT shower/wash with your normal soap after using and rinsing off the CHG Soap.  9. Pat yourself dry with a CLEAN TOWEL.  10. Wear CLEAN PAJAMAS to bed the night before surgery, wear comfortable clothes the morning of surgery  11. Place CLEAN SHEETS on your bed the night of your first shower and DO NOT SLEEP WITH PETS.  Day of Surgery: Shower with CHG soap as instructed above.  Do not apply any deodorants/lotions.  Please wear clean clothes to the hospital/surgery center.   Remember to brush your teeth WITH YOUR REGULAR TOOTHPASTE.   Please read over the following fact sheets that you were given.

## 2019-08-28 ENCOUNTER — Encounter (HOSPITAL_COMMUNITY)
Admission: RE | Admit: 2019-08-28 | Discharge: 2019-08-28 | Disposition: A | Discharge status: Home / Self Care | Payer: BC Managed Care – PPO | Source: Ambulatory Visit | Attending: General Surgery | Admitting: General Surgery

## 2019-08-28 ENCOUNTER — Other Ambulatory Visit: Payer: Self-pay

## 2019-08-28 ENCOUNTER — Encounter (HOSPITAL_COMMUNITY): Payer: Self-pay

## 2019-08-28 DIAGNOSIS — L930 Discoid lupus erythematosus: Secondary | ICD-10-CM | POA: Insufficient documentation

## 2019-08-28 DIAGNOSIS — J449 Chronic obstructive pulmonary disease, unspecified: Secondary | ICD-10-CM | POA: Diagnosis not present

## 2019-08-28 DIAGNOSIS — Z01818 Encounter for other preprocedural examination: Secondary | ICD-10-CM | POA: Diagnosis present

## 2019-08-28 DIAGNOSIS — F17210 Nicotine dependence, cigarettes, uncomplicated: Secondary | ICD-10-CM | POA: Diagnosis not present

## 2019-08-28 HISTORY — DX: Anxiety disorder, unspecified: F41.9

## 2019-08-28 HISTORY — DX: Hypothyroidism, unspecified: E03.9

## 2019-08-28 HISTORY — DX: Chronic obstructive pulmonary disease, unspecified: J44.9

## 2019-08-28 HISTORY — DX: Unspecified asthma, uncomplicated: J45.909

## 2019-08-28 LAB — CBC
HCT: 43.6 % (ref 36.0–46.0)
Hemoglobin: 14.4 g/dL (ref 12.0–15.0)
MCH: 29.5 pg (ref 26.0–34.0)
MCHC: 33 g/dL (ref 30.0–36.0)
MCV: 89.3 fL (ref 80.0–100.0)
Platelets: 281 10*3/uL (ref 150–400)
RBC: 4.88 MIL/uL (ref 3.87–5.11)
RDW: 13 % (ref 11.5–15.5)
WBC: 6.6 10*3/uL (ref 4.0–10.5)
nRBC: 0 % (ref 0.0–0.2)

## 2019-08-28 NOTE — Progress Notes (Signed)
PCP - Delsa Bern, MD Cardiologist - pt denies   Chest x-ray - n/a EKG - n/a Stress Test - pt denies ECHO - 08/04/19  Cardiac Cath - pt denies    Blood Thinner Instructions: n/a Aspirin Instructions: n/a  ERAS Protcol - yes  PRE-SURGERY Ensure or G2- ensure  COVID TEST- 09/02/19   Anesthesia review: yes   >pt stated to have some shortness of breath and new productive cough for about 1-2 months and has not spoken with PCP. Stephanie Chase, Stephanie Chase came to see pt in PAT. Pt was instructed to speak with PCP to get their opinion.   Patient denies shortness of breath, fever, cough and chest pain at PAT appointment   All instructions explained to the patient, with a verbal understanding of the material. Patient agrees to go over the instructions while at home for a better understanding. Patient also instructed to self quarantine after being tested for COVID-19. The opportunity to ask questions was provided.

## 2019-08-29 NOTE — Progress Notes (Signed)
Anesthesia Chart Review:  I spoke with patient that her PAT appointment due to her report of mildly productive cough for the past several months.  She has underlying history of COPD and a 50-year smoking history (reportedly started smoking in second grade).  She does report long history of a "smoker's cough", but says for the past couple months she has been coughing more and producing more clear sputum.  She denies any signs or symptoms of illness, denies any purulent or bloody mucus.  She denies any chest pain. She says her shortness of breath may be a little worse but she does very little physical activity so it is somewhat hard to get a sense of that.  She can go up stairs but she does get short of breath.  She is not short of breath at rest.  She has reduced her smoking.  However, she states that if she completely stops she is prone to having paroxysms of coughing.  I encouraged her to discuss this with primary care.  On exam her breathing is comfortable and unlabored.  Her lungs are clear but lung sounds are diminished bilaterally.  Heart regular rate and rhythm, no M/R/G.  She does have as needed albuterol but does not have any maintenance medications for COPD.  I discussed with her that what she describing sounds more consistent with progression of underlying COPD and does not appear consistent with acute exacerbation.  She gets her primary care through her gynecologist Dr. Katharine Look Rivard and I encouraged her to reach out to Dr. Cletis Media to discuss her symptoms to see if she will benefit from any additional therapy. She understands to seek care for any acute worsening.  I called the pt on 6/8 to followup on her cough. She said she did call Dr. Janie Morning (who she had seen previously for primary care but does not follow with regularly) and was prescribed amoxicillin and hydrocodone cough syrup. She was not seen in the office. She started amoxicillin on 6/4 and will finish on 6/14. She thinks he cough has  mildly improved. Otherwise she feels about the same. Stable DOE. She says she did call Dr. Marlowe Aschoff office to make her aware.  Pre chemo echo 08/04/2019 showed normal LVEF 65 to 70%.  Normal valves.  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  Preop labs reviewed, WNL.  CMP done at [facility] 07/30/2019 reviewed, WNL.  Discussed case with Dr. Jillyn Hidden. He advised pt can proceed as planned barring worsening of respiratory status.   TTE 08/04/2019: 1. Left ventricular ejection fraction, by estimation, is 65 to 70%. The  left ventricle has normal function. The left ventricle has no regional  wall motion abnormalities. Left ventricular diastolic parameters were  normal. The average left ventricular  global longitudinal strain is -20.4 %.  2. Right ventricular systolic function is normal. The right ventricular  size is normal.  3. The mitral valve is normal in structure. No evidence of mitral valve  regurgitation. No evidence of mitral stenosis.  4. The aortic valve is normal in structure. Aortic valve regurgitation is  not visualized. No aortic stenosis is present.    Wynonia Musty [facility] Short Stay Center/Anesthesiology Phone [phone] 09/02/2019 4:02 PM

## 2019-09-01 NOTE — H&P (Signed)
Stephanie Chase Location: [facility] Patient #: [id number] DOB: 01-01-1961 Undefined / Language: Stephanie Chase / Race: White Female   History of Present Illness The patient is a 59 year old female who presents with breast cancer. Pt is a 59 yo F who presents with screening detected right breast cancer 06/2019. She had screening detected asymmetry and calcifications. It was originally thought that the asymmetry was from a prior benign breast biopsy, but on diagnostic imaging it looked a little suspicious. She was seen to have a 1.3 cm mass at 12 o'clock, and 1.6 cm of abnormal calcifications in the upper outer quadrant. 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 The Ki67 was 50%.   She does not have any prior cancer history. She had a mother and maternal aunt who had breast cancer. Heer mom also had "blood cancer," bladder cancer, and uterine cancer. Her daughter has had genetic testing and has a VUS for BRCA 2.   She has actually had excisional biopsies on both breasts. 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 She has to carry a briefcase around 10 pounds for work, but otherwise is sedentary for job.   Diagn[facility] from [facility]   pathology is described above.   CBC and CMET normal from 07/30/2019    Past Surgical History Breast Biopsy  Bilateral. Tonsillectomy   Diagnostic Studies History  Colonoscopy  1-5 years ago Mammogram  within last year Pap Smear  1-5 years ago  Medication History Medications Reconciled  Social History Alcohol use  Occasional alcohol use. Caffeine use  Carbonated beverages, Coffee, Tea. Illicit drug use  Remotely quit drug use. Tobacco use  Current every day smoker.  Family History Breast Cancer  Mother. Cancer  Mother. Diabetes Mellitus  Mother. Heart Disease  Father. Hypertension  Father, Mother. Prostate Cancer   Father. Thyroid problems  Daughter, Mother.  Pregnancy / Birth History Age at menarche  66 years. Age of menopause  [age] Contraceptive History  Oral contraceptives. Gravida  2 Maternal age  39-20 Para  2 Regular periods   Other Problems Anxiety Disorder  Asthma  Cancer  Chronic Obstructive Lung Disease  Thyroid Disease     Review of Systems  General Present- Weight Gain. Not Present- Appetite Loss, Chills, Fatigue, Fever, Night Sweats and Weight Loss. Skin Not Present- Change in Wart/Mole, Dryness, Hives, Jaundice, New Lesions, Non-Healing Wounds, Rash and Ulcer. HEENT Present- Seasonal Allergies and Wears glasses/contact lenses. Not Present- Earache, Hearing Loss, Hoarseness, Nose Bleed, Oral Ulcers, Ringing in the Ears, Sinus Pain, Sore Throat, Visual Disturbances and Yellow Eyes. Respiratory Present- Chronic Cough. Not Present- Bloody sputum, Difficulty Breathing, Snoring and Wheezing. Breast Present- Breast Pain. Not Present- Breast Mass, Nipple Discharge and Skin Changes. Cardiovascular Present- Shortness of Breath. Not Present- Chest Pain, Difficulty Breathing Lying Down, Leg Cramps, Palpitations, Rapid Heart Rate and Swelling of Extremities. Gastrointestinal Not Present- Abdominal Pain, Bloating, Bloody Stool, Change in Bowel Habits, Chronic diarrhea, Constipation, Difficulty Swallowing, Excessive gas, Gets full quickly at meals, Hemorrhoids, Indigestion, Nausea, Rectal Pain and Vomiting. Female Genitourinary Not Present- Frequency, Nocturia, Painful Urination, Pelvic Pain and Urgency. Musculoskeletal Not Present- Back Pain, Joint Pain, Joint Stiffness, Muscle Pain, Muscle Weakness and Swelling of Extremities. Neurological Not Present- Decreased Memory, Fainting, Headaches, Numbness, Seizures, Tingling, Tremor, Trouble walking and Weakness. Psychiatric Present- Anxiety. Not Present- Bipolar, Change in Sleep Pattern, Depression, Fearful and Frequent  crying.  Endocrine Present- Cold Intolerance and Hot flashes. Not Present- Excessive Hunger, Hair Changes, Heat Intolerance and New Diabetes. Hematology Present- Gland problems. Not Present- Blood Thinners, Easy Bruising, Excessive bleeding, HIV and Persistent Infections.  Vitals  Weight: 159.6 lb Height: 65in Body Surface Area: 1.8 m Body Mass Index: 26.56 kg/m  Temp.: 100F  Pulse: 81 (Regular)  Resp.: 18 (Unlabored)  BP: 120/85(Sitting, Left Arm, Standard)       Physical Exam General Mental Status-Alert. General Appearance-Consistent with stated age. Hydration-Well hydrated. Voice-Normal.  Head and Neck Head-normocephalic, atraumatic with no lesions or palpable masses. Trachea-midline. Thyroid Gland Characteristics - normal size and consistency.  Eye Eyeball - Bilateral-Extraocular movements intact. Sclera/Conjunctiva - Bilateral-No scleral icterus.  Chest and Lung Exam Chest and lung exam reveals -quiet, even and easy respiratory effort with no use of accessory muscles and on auscultation, normal breath sounds, no adventitious sounds and normal vocal resonance. Inspection Chest Wall - Normal. Back - normal.  Breast Note: faint bruising right upper breast. right breast smaller than left. no palpable masses. no skin dimpling or nipple retraction. right breast has circumlinear scar over nipple. left breast has lateral circumareolar scar.   Cardiovascular Cardiovascular examination reveals -normal heart sounds, regular rate and rhythm with no murmurs and normal pedal pulses bilaterally.  Abdomen Inspection Inspection of the abdomen reveals - No Hernias. Palpation/Percussion Palpation and Percussion of the abdomen reveal - Soft, Non Tender, No Rebound tenderness, No Rigidity (guarding) and No hepatosplenomegaly. Auscultation Auscultation of the abdomen reveals - Bowel sounds normal.  Neurologic Neurologic evaluation reveals  -alert and oriented x 3 with no impairment of recent or remote memory. Mental Status-Normal.  Musculoskeletal Global Assessment -Note: no gross deformities.  Normal Exam - Left-Upper Extremity Strength Normal and Lower Extremity Strength Normal. Normal Exam - Right-Upper Extremity Strength Normal and Lower Extremity Strength Normal.  Lymphatic Head & Neck  General Head & Neck Lymphatics: Bilateral - Description - Normal. Axillary  General Axillary Region: Bilateral - Description - Normal. Tenderness - Non Tender. Femoral & Inguinal  Generalized Femoral & Inguinal Lymphatics: Bilateral - Description - No Generalized lymphadenopathy.    Assessment & Plan MALIGNANT NEOPLASM OF UPPER-OUTER QUADRANT OF RIGHT BREAST IN FEMALE, ESTROGEN RECEPTOR POSITIVE (C50.411) Impression: Pt has a new diagnosis of cT1cN0 right breast cancer that is ER + and her 2 positive. Her right breast is already smaller after two breast biopsies. She is concerned about how different the size would be if she had a lumpectomy. She is leaning toward a mastectomy on the right. She will also need a port because of the need for chemo. I discussed mastectomy, SLN biopsy and port placement with the patient and her children. One was present in person and one by video call. She also would like to try to avoid radiation.  She is not sure that she has any desire for reconstruction, and she is a smoker. I offered her a chance ot have a discussion with a plastic surgeon in order find out what, if any reconstruction she has any desire to pursue. I would anticipate that if she doesn't stop smoking, that she is not a candidate for any reconstruction, immediate or delayed.  She will also need antiestrogen tx after surgery and chemo.  Her breasts are relatively dense and given family history and number of prior biopsies, we will get an MR. This will primarily help [place] define the other breast better. She is considering  bilateral mastectomies since she has dense breasts, strong family history,  and doesn't want to "do this again."  I discussed pros and cons of unilateral vs bilateral reconstruction. I reviewed that in the absence of significant genetic defect, this does not impact survival and does not eliminate the possibility of recurrent cancer.  I reviewed surgery including pec block and SLn injection in pre op holding area and need for COVID test. I discussed risks of bleeding, infection, damage to adjacent structures, pain, dissatisfaction with scar(s), loss of skin, need for wound care or additional surgeries, and more. Once the MRI is done and we see what her conversation with plastics is about, we will make final surgical plan.

## 2019-09-02 ENCOUNTER — Other Ambulatory Visit (HOSPITAL_COMMUNITY)
Admission: RE | Admit: 2019-09-02 | Discharge: 2019-09-02 | Disposition: A | Discharge status: Home / Self Care | Payer: BC Managed Care – PPO | Source: Ambulatory Visit | Attending: General Surgery | Admitting: General Surgery

## 2019-09-02 DIAGNOSIS — Z01812 Encounter for preprocedural laboratory examination: Secondary | ICD-10-CM | POA: Insufficient documentation

## 2019-09-02 DIAGNOSIS — Z20822 Contact with and (suspected) exposure to covid-19: Secondary | ICD-10-CM | POA: Diagnosis not present

## 2019-09-02 LAB — SARS CORONAVIRUS 2 (TAT 6-24 HRS): SARS Coronavirus 2: NEGATIVE

## 2019-09-02 NOTE — Anesthesia Preprocedure Evaluation (Addendum)
Anesthesia Evaluation  Patient identified by MRN, date of birth, ID band Patient awake    Reviewed: Allergy & Precautions, NPO status , Patient's Chart, lab work & pertinent test results  Airway Mallampati: II  TM Distance: >3 FB Neck ROM: Full    Dental no notable dental hx. (+) Teeth Intact, Dental Advisory Given,    Pulmonary asthma , COPD,  COPD inhaler, Current Smoker and Patient abstained from smoking.,    Pulmonary exam normal breath sounds clear to auscultation       Cardiovascular Exercise Tolerance: Good negative cardio ROS Normal cardiovascular exam Rhythm:Regular Rate:Normal  08/04/19 Echo Left Ventricle: Left ventricular ejection fraction, by estimation, is 65  to 70%. The left ventricle has normal function. The left ventricle has no  regional wall motion abnormalities. The average left ventricular global  longitudinal strain is -20.4 %.  The left ventricular internal cavity size was normal in size. There is no  left ventricular hypertrophy. Left ventricular diastolic parameters were  normal.    Neuro/Psych Anxiety negative neurological ROS     GI/Hepatic negative GI ROS, Neg liver ROS,   Endo/Other  Hypothyroidism   Renal/GU negative Renal ROS     Musculoskeletal  (+) Arthritis ,   Abdominal   Peds  Hematology Lab Results      Component                Value               Date                      WBC                      6.6                 08/28/2019                HGB                      14.4                08/28/2019                HCT                      43.6                08/28/2019                MCV                      89.3                08/28/2019                PLT                      281                 08/28/2019             Anesthesia Other Findings Breast CA  Reproductive/Obstetrics negative OB ROS                          Anesthesia Physical Anesthesia  Plan  ASA: II  Anesthesia Plan: General   Post-op Pain Management:  GA combined w/ Regional for post-op pain   Induction: Intravenous  PONV Risk Score and Plan: Treatment may vary due to age or medical condition, Ondansetron and Dexamethasone  Airway Management Planned: Oral ETT  Additional Equipment: None  Intra-op Plan:   Post-operative Plan: Extubation in OR  Informed Consent: I have reviewed the patients History and Physical, chart, labs and discussed the procedure including the risks, benefits and alternatives for the proposed anesthesia with the patient or authorized representative who has indicated his/her understanding and acceptance.     Dental advisory given  Plan Discussed with: CRNA  Anesthesia Plan Comments: (See PAT note by Karoline Caldwell, PA-C  GA w Bilat Pec blocks exparel )     Anesthesia Quick Evaluation

## 2019-09-05 ENCOUNTER — Ambulatory Visit (HOSPITAL_COMMUNITY): Payer: BC Managed Care – PPO

## 2019-09-05 ENCOUNTER — Other Ambulatory Visit: Payer: Self-pay

## 2019-09-05 ENCOUNTER — Encounter (HOSPITAL_COMMUNITY)
Admission: RE | Disposition: A | Discharge status: Home / Self Care | Payer: Self-pay | Source: Home / Self Care | Attending: General Surgery

## 2019-09-05 ENCOUNTER — Encounter (HOSPITAL_COMMUNITY): Payer: Self-pay | Admitting: General Surgery

## 2019-09-05 ENCOUNTER — Ambulatory Visit (HOSPITAL_COMMUNITY): Payer: BC Managed Care – PPO | Admitting: Anesthesiology

## 2019-09-05 ENCOUNTER — Observation Stay (HOSPITAL_COMMUNITY)
Admission: RE | Admit: 2019-09-05 | Discharge: 2019-09-07 | Disposition: A | Discharge status: Home / Self Care | Payer: BC Managed Care – PPO | Attending: General Surgery | Admitting: General Surgery

## 2019-09-05 ENCOUNTER — Ambulatory Visit (HOSPITAL_COMMUNITY)
Admission: RE | Admit: 2019-09-05 | Discharge: 2019-09-05 | Disposition: A | Discharge status: Home / Self Care | Payer: BC Managed Care – PPO | Source: Ambulatory Visit | Attending: General Surgery | Admitting: General Surgery

## 2019-09-05 ENCOUNTER — Ambulatory Visit (HOSPITAL_COMMUNITY): Payer: BC Managed Care – PPO | Admitting: Physician Assistant

## 2019-09-05 DIAGNOSIS — L930 Discoid lupus erythematosus: Secondary | ICD-10-CM | POA: Insufficient documentation

## 2019-09-05 DIAGNOSIS — C50411 Malignant neoplasm of upper-outer quadrant of right female breast: Secondary | ICD-10-CM | POA: Diagnosis not present

## 2019-09-05 DIAGNOSIS — F419 Anxiety disorder, unspecified: Secondary | ICD-10-CM | POA: Diagnosis not present

## 2019-09-05 DIAGNOSIS — C50011 Malignant neoplasm of nipple and areola, right female breast: Secondary | ICD-10-CM

## 2019-09-05 DIAGNOSIS — D0511 Intraductal carcinoma in situ of right breast: Secondary | ICD-10-CM | POA: Diagnosis not present

## 2019-09-05 DIAGNOSIS — F17200 Nicotine dependence, unspecified, uncomplicated: Secondary | ICD-10-CM | POA: Diagnosis not present

## 2019-09-05 DIAGNOSIS — E039 Hypothyroidism, unspecified: Secondary | ICD-10-CM | POA: Diagnosis not present

## 2019-09-05 DIAGNOSIS — J449 Chronic obstructive pulmonary disease, unspecified: Secondary | ICD-10-CM | POA: Insufficient documentation

## 2019-09-05 DIAGNOSIS — Z95828 Presence of other vascular implants and grafts: Secondary | ICD-10-CM

## 2019-09-05 DIAGNOSIS — Z803 Family history of malignant neoplasm of breast: Secondary | ICD-10-CM | POA: Insufficient documentation

## 2019-09-05 DIAGNOSIS — Z170 Estrogen receptor positive status [ER+]: Secondary | ICD-10-CM

## 2019-09-05 DIAGNOSIS — Z9889 Other specified postprocedural states: Secondary | ICD-10-CM

## 2019-09-05 DIAGNOSIS — Z419 Encounter for procedure for purposes other than remedying health state, unspecified: Secondary | ICD-10-CM

## 2019-09-05 DIAGNOSIS — E785 Hyperlipidemia, unspecified: Secondary | ICD-10-CM | POA: Diagnosis not present

## 2019-09-05 HISTORY — PX: MASTECTOMY W/ SENTINEL NODE BIOPSY: SHX2001

## 2019-09-05 HISTORY — PX: PORTACATH PLACEMENT: SHX2246

## 2019-09-05 IMAGING — DX DG CHEST 1V PORT
1 series · 1 of 1 positions shown · non-contrast
Comparison: [DATE]

CLINICAL DATA: Port-A-Cath placement.

EXAM:
PORTABLE CHEST 1 VIEW

[chest ap]
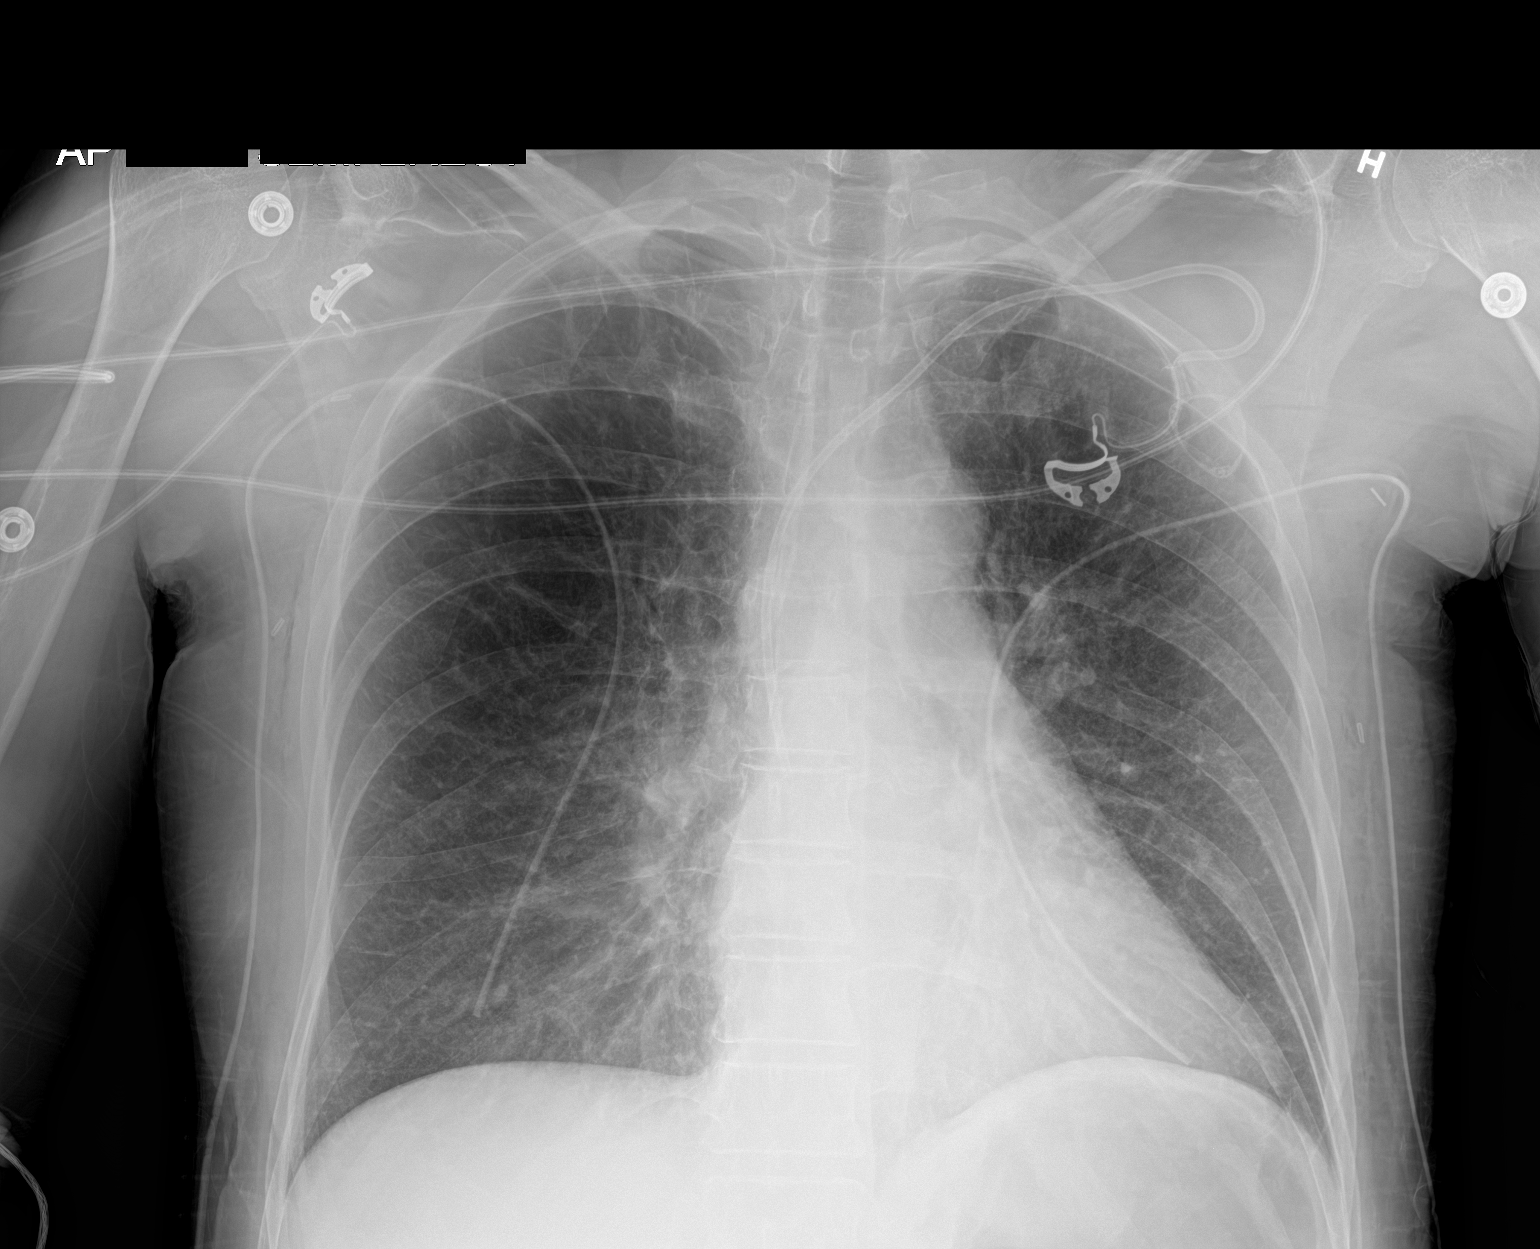

[1 of 1 positions shown; findings below may reference images not displayed]

FINDINGS: Power port inserted from a left subclavian approach. Mild kinking of
the 2 at the port attachment site. This may or may not be
significant. Tip of the catheter is in the SVC 1 cm above the right
atrium. No pneumothorax. Mild interstitial prominence without frank
edema. No infiltrate, collapse or effusion. Presumed mastectomy
changes with bilateral soft tissue drains.
IMPRESSION: Port tip in the SVC 1 cm above the right atrium. Slight kink at port
attachment site, significance uncertain.

Bilateral soft tissue chest drains. Mild interstitial prominence.
Otherwise negative.

## 2019-09-05 SURGERY — MASTECTOMY WITH SENTINEL LYMPH NODE BIOPSY
Anesthesia: General | Site: Chest | Laterality: Left

## 2019-09-05 MED ORDER — ALPRAZOLAM 0.25 MG PO TABS: 0.2500 mg | ORAL_TABLET | Freq: Two times a day (BID) | ORAL | Status: DC | PRN

## 2019-09-05 MED ORDER — KCL IN DEXTROSE-NACL 20-5-0.45 MEQ/L-%-% IV SOLN
INTRAVENOUS | Status: DC
  Filled 2019-09-05 (×2): qty 1000

## 2019-09-05 MED ORDER — BUPIVACAINE HCL (PF) 0.25 % IJ SOLN
INTRAMUSCULAR | Status: AC
  Filled 2019-09-05: qty 30

## 2019-09-05 MED ORDER — METHYLENE BLUE 0.5 % INJ SOLN
INTRAVENOUS | Status: AC
  Filled 2019-09-05: qty 10

## 2019-09-05 MED ORDER — ALBUTEROL SULFATE (2.5 MG/3ML) 0.083% IN NEBU: 2.5000 mg | INHALATION_SOLUTION | Freq: Four times a day (QID) | RESPIRATORY_TRACT | Status: DC | PRN

## 2019-09-05 MED ORDER — ROCURONIUM BROMIDE 10 MG/ML (PF) SYRINGE
PREFILLED_SYRINGE | INTRAVENOUS | Status: AC
  Filled 2019-09-05: qty 10

## 2019-09-05 MED ORDER — CHLORHEXIDINE GLUCONATE 0.12 % MT SOLN: 15.0000 mL | Freq: Once | OROMUCOSAL | Status: AC

## 2019-09-05 MED ORDER — LIDOCAINE-EPINEPHRINE 1 %-1:100000 IJ SOLN
INTRAMUSCULAR | Status: AC
  Filled 2019-09-05: qty 1

## 2019-09-05 MED ORDER — CEFAZOLIN SODIUM-DEXTROSE 2-4 GM/100ML-% IV SOLN
2.0000 g | INTRAVENOUS | Status: AC
  Administered 2019-09-05: 2 g via INTRAVENOUS
  Filled 2019-09-05: qty 100

## 2019-09-05 MED ORDER — FENTANYL CITRATE (PF) 250 MCG/5ML IJ SOLN
INTRAMUSCULAR | Status: AC
  Filled 2019-09-05: qty 5

## 2019-09-05 MED ORDER — METHOCARBAMOL 500 MG PO TABS: 500.0000 mg | ORAL_TABLET | Freq: Four times a day (QID) | ORAL | Status: DC | PRN

## 2019-09-05 MED ORDER — MORPHINE SULFATE (PF) 2 MG/ML IV SOLN: 1.0000 mg | INTRAVENOUS | Status: DC | PRN

## 2019-09-05 MED ORDER — DEXAMETHASONE SODIUM PHOSPHATE 10 MG/ML IJ SOLN
INTRAMUSCULAR | Status: DC | PRN
  Administered 2019-09-05: 10 mg via INTRAVENOUS

## 2019-09-05 MED ORDER — BUPIVACAINE LIPOSOME 1.3 % IJ SUSP
INTRAMUSCULAR | Status: DC | PRN
Start: 2019-09-05 — End: 2019-09-05
  Administered 2019-09-05: 10 mL

## 2019-09-05 MED ORDER — OXYCODONE HCL 5 MG PO TABS: 5.0000 mg | ORAL_TABLET | Freq: Four times a day (QID) | ORAL | 0 refills | Status: DC | PRN

## 2019-09-05 MED ORDER — ACETAMINOPHEN 500 MG PO TABS
1000.0000 mg | ORAL_TABLET | ORAL | Status: AC
  Administered 2019-09-05: 1000 mg via ORAL
  Filled 2019-09-05: qty 2

## 2019-09-05 MED ORDER — LIDOCAINE 2% (20 MG/ML) 5 ML SYRINGE
INTRAMUSCULAR | Status: DC | PRN
  Administered 2019-09-05: 100 mg via INTRAVENOUS

## 2019-09-05 MED ORDER — 0.9 % SODIUM CHLORIDE (POUR BTL) OPTIME
TOPICAL | Status: DC | PRN
  Administered 2019-09-05: 2000 mL

## 2019-09-05 MED ORDER — DIPHENHYDRAMINE HCL 12.5 MG/5ML PO ELIX: 12.5000 mg | ORAL_SOLUTION | Freq: Four times a day (QID) | ORAL | Status: DC | PRN

## 2019-09-05 MED ORDER — DEXAMETHASONE SODIUM PHOSPHATE 10 MG/ML IJ SOLN
INTRAMUSCULAR | Status: AC
  Filled 2019-09-05: qty 1

## 2019-09-05 MED ORDER — MIDAZOLAM HCL 2 MG/2ML IJ SOLN
INTRAMUSCULAR | Status: AC
  Filled 2019-09-05: qty 2

## 2019-09-05 MED ORDER — FENTANYL CITRATE (PF) 100 MCG/2ML IJ SOLN
INTRAMUSCULAR | Status: DC | PRN
  Administered 2019-09-05 (×7): 50 ug via INTRAVENOUS

## 2019-09-05 MED ORDER — KETOROLAC TROMETHAMINE 30 MG/ML IJ SOLN: 30.0000 mg | Freq: Once | INTRAMUSCULAR | Status: DC | PRN

## 2019-09-05 MED ORDER — PROPOFOL 10 MG/ML IV BOLUS
INTRAVENOUS | Status: AC
  Filled 2019-09-05: qty 20

## 2019-09-05 MED ORDER — CHLORHEXIDINE GLUCONATE CLOTH 2 % EX PADS: 6.0000 | MEDICATED_PAD | Freq: Once | CUTANEOUS | Status: DC

## 2019-09-05 MED ORDER — SODIUM CHLORIDE 0.9 % IV SOLN
INTRAVENOUS | Status: AC
  Filled 2019-09-05: qty 1.2

## 2019-09-05 MED ORDER — ACETAMINOPHEN 500 MG PO TABS: 1000.0000 mg | ORAL_TABLET | Freq: Four times a day (QID) | ORAL | Status: DC | PRN

## 2019-09-05 MED ORDER — HEMOSTATIC AGENTS (NO CHARGE) OPTIME
TOPICAL | Status: DC | PRN
  Administered 2019-09-05 (×2): 1 via TOPICAL

## 2019-09-05 MED ORDER — SUGAMMADEX SODIUM 200 MG/2ML IV SOLN
INTRAVENOUS | Status: DC | PRN
Start: 2019-09-05 — End: 2019-09-05
  Administered 2019-09-05: 200 mg via INTRAVENOUS

## 2019-09-05 MED ORDER — FLUTICASONE PROPIONATE 50 MCG/ACT NA SUSP
1.0000 | Freq: Every day | NASAL | Status: DC | PRN
  Filled 2019-09-05: qty 16

## 2019-09-05 MED ORDER — ROCURONIUM BROMIDE 10 MG/ML (PF) SYRINGE
PREFILLED_SYRINGE | INTRAVENOUS | Status: DC | PRN
  Administered 2019-09-05: 100 mg via INTRAVENOUS

## 2019-09-05 MED ORDER — BUPIVACAINE LIPOSOME 1.3 % IJ SUSP
INTRAMUSCULAR | Status: DC | PRN
  Administered 2019-09-05: 10 mL

## 2019-09-05 MED ORDER — BUPIVACAINE HCL (PF) 0.25 % IJ SOLN
INTRAMUSCULAR | Status: DC | PRN
  Administered 2019-09-05: 20 mL

## 2019-09-05 MED ORDER — ONDANSETRON HCL 4 MG/2ML IJ SOLN: 4.0000 mg | Freq: Once | INTRAMUSCULAR | Status: DC | PRN

## 2019-09-05 MED ORDER — CHLORHEXIDINE GLUCONATE 0.12 % MT SOLN
OROMUCOSAL | Status: AC
  Administered 2019-09-05: 15 mL via OROMUCOSAL
  Filled 2019-09-05: qty 15

## 2019-09-05 MED ORDER — LEVOTHYROXINE SODIUM 75 MCG PO TABS
75.0000 ug | ORAL_TABLET | Freq: Every day | ORAL | Status: DC
  Administered 2019-09-06 – 2019-09-07 (×2): 75 ug via ORAL
  Filled 2019-09-05 (×2): qty 1

## 2019-09-05 MED ORDER — SCOPOLAMINE 1 MG/3DAYS TD PT72: 1.0000 | MEDICATED_PATCH | TRANSDERMAL | Status: DC

## 2019-09-05 MED ORDER — BUPIVACAINE HCL (PF) 0.25 % IJ SOLN
INTRAMUSCULAR | Status: DC | PRN
  Administered 2019-09-05: .001 mL

## 2019-09-05 MED ORDER — LIDOCAINE-EPINEPHRINE 1 %-1:100000 IJ SOLN
INTRAMUSCULAR | Status: DC | PRN
  Administered 2019-09-05: .001 mL

## 2019-09-05 MED ORDER — LACTATED RINGERS IV SOLN: INTRAVENOUS | Status: DC | PRN

## 2019-09-05 MED ORDER — DEXTROMETHORPHAN POLISTIREX ER 30 MG/5ML PO SUER
60.0000 mg | Freq: Two times a day (BID) | ORAL | Status: DC | PRN
  Administered 2019-09-06: 60 mg via ORAL
  Filled 2019-09-05 (×3): qty 10

## 2019-09-05 MED ORDER — MIDAZOLAM HCL 5 MG/5ML IJ SOLN
INTRAMUSCULAR | Status: DC | PRN
  Administered 2019-09-05: 2 mg via INTRAVENOUS

## 2019-09-05 MED ORDER — ONDANSETRON HCL 4 MG/2ML IJ SOLN
INTRAMUSCULAR | Status: DC | PRN
  Administered 2019-09-05: 4 mg via INTRAVENOUS

## 2019-09-05 MED ORDER — LIDOCAINE HCL (PF) 1 % IJ SOLN
INTRAMUSCULAR | Status: AC
  Filled 2019-09-05: qty 30

## 2019-09-05 MED ORDER — LIDOCAINE 2% (20 MG/ML) 5 ML SYRINGE
INTRAMUSCULAR | Status: AC
  Filled 2019-09-05: qty 5

## 2019-09-05 MED ORDER — HEPARIN SOD (PORK) LOCK FLUSH 100 UNIT/ML IV SOLN
INTRAVENOUS | Status: AC
  Filled 2019-09-05: qty 5

## 2019-09-05 MED ORDER — ORAL CARE MOUTH RINSE: 15.0000 mL | Freq: Once | OROMUCOSAL | Status: AC

## 2019-09-05 MED ORDER — ONDANSETRON HCL 4 MG/2ML IJ SOLN: 4.0000 mg | Freq: Four times a day (QID) | INTRAMUSCULAR | Status: DC | PRN

## 2019-09-05 MED ORDER — PROPOFOL 10 MG/ML IV BOLUS
INTRAVENOUS | Status: DC | PRN
  Administered 2019-09-05: 140 mg via INTRAVENOUS

## 2019-09-05 MED ORDER — ENSURE PRE-SURGERY PO LIQD: 296.0000 mL | Freq: Once | ORAL | Status: DC

## 2019-09-05 MED ORDER — SCOPOLAMINE 1 MG/3DAYS TD PT72
MEDICATED_PATCH | TRANSDERMAL | Status: AC
  Administered 2019-09-05: 1.5 mg via TRANSDERMAL
  Filled 2019-09-05: qty 1

## 2019-09-05 MED ORDER — ONDANSETRON HCL 4 MG/2ML IJ SOLN
INTRAMUSCULAR | Status: AC
  Filled 2019-09-05: qty 2

## 2019-09-05 MED ORDER — ONDANSETRON 4 MG PO TBDP: 4.0000 mg | ORAL_TABLET | Freq: Four times a day (QID) | ORAL | Status: DC | PRN

## 2019-09-05 MED ORDER — NICOTINE 14 MG/24HR TD PT24
14.0000 mg | MEDICATED_PATCH | Freq: Every day | TRANSDERMAL | Status: DC
  Administered 2019-09-05 – 2019-09-07 (×3): 14 mg via TRANSDERMAL
  Filled 2019-09-05 (×3): qty 1

## 2019-09-05 MED ORDER — TRAMADOL HCL 50 MG PO TABS: 50.0000 mg | ORAL_TABLET | Freq: Four times a day (QID) | ORAL | Status: DC | PRN

## 2019-09-05 MED ORDER — HYDROMORPHONE HCL 1 MG/ML IJ SOLN
0.2500 mg | INTRAMUSCULAR | Status: DC | PRN
  Administered 2019-09-05 (×2): 0.5 mg via INTRAVENOUS

## 2019-09-05 MED ORDER — DIPHENHYDRAMINE HCL 50 MG/ML IJ SOLN: 12.5000 mg | Freq: Four times a day (QID) | INTRAMUSCULAR | Status: DC | PRN

## 2019-09-05 MED ORDER — LORATADINE 10 MG PO TABS
10.0000 mg | ORAL_TABLET | Freq: Every day | ORAL | Status: DC
  Administered 2019-09-05 – 2019-09-07 (×3): 10 mg via ORAL
  Filled 2019-09-05 (×3): qty 1

## 2019-09-05 MED ORDER — OXYCODONE HCL 5 MG/5ML PO SOLN: 5.0000 mg | Freq: Once | ORAL | Status: DC | PRN

## 2019-09-05 MED ORDER — TECHNETIUM TC 99M SULFUR COLLOID FILTERED
1.0000 | Freq: Once | INTRAVENOUS | Status: AC | PRN
  Administered 2019-09-05: 1 via INTRADERMAL

## 2019-09-05 MED ORDER — SODIUM CHLORIDE 0.9 % IV SOLN
INTRAVENOUS | Status: DC | PRN
  Administered 2019-09-05: 500 mL

## 2019-09-05 MED ORDER — PHENYLEPHRINE HCL-NACL 10-0.9 MG/250ML-% IV SOLN
INTRAVENOUS | Status: DC | PRN
  Administered 2019-09-05: 25 ug/min via INTRAVENOUS

## 2019-09-05 MED ORDER — HYDROMORPHONE HCL 1 MG/ML IJ SOLN
INTRAMUSCULAR | Status: AC
  Filled 2019-09-05: qty 1

## 2019-09-05 MED ORDER — GABAPENTIN 100 MG PO CAPS
100.0000 mg | ORAL_CAPSULE | ORAL | Status: AC
  Administered 2019-09-05: 100 mg via ORAL
  Filled 2019-09-05: qty 1

## 2019-09-05 MED ORDER — OXYCODONE HCL 5 MG PO TABS: 5.0000 mg | ORAL_TABLET | Freq: Once | ORAL | Status: DC | PRN

## 2019-09-05 MED ORDER — CEFAZOLIN SODIUM-DEXTROSE 2-4 GM/100ML-% IV SOLN
2.0000 g | Freq: Three times a day (TID) | INTRAVENOUS | Status: AC
  Administered 2019-09-05: 2 g via INTRAVENOUS
  Filled 2019-09-05: qty 100

## 2019-09-05 MED ORDER — METHOCARBAMOL 500 MG PO TABS: 500.0000 mg | ORAL_TABLET | Freq: Four times a day (QID) | ORAL | 1 refills | Status: DC | PRN

## 2019-09-05 MED ORDER — HEPARIN SOD (PORK) LOCK FLUSH 100 UNIT/ML IV SOLN
INTRAVENOUS | Status: DC | PRN
  Administered 2019-09-05: 500 [IU] via INTRAVENOUS

## 2019-09-05 MED ORDER — LIOTHYRONINE SODIUM 5 MCG PO TABS
10.0000 ug | ORAL_TABLET | Freq: Every day | ORAL | Status: DC
  Administered 2019-09-06 – 2019-09-07 (×2): 10 ug via ORAL
  Filled 2019-09-05 (×2): qty 2

## 2019-09-05 MED ORDER — OXYCODONE HCL 5 MG PO TABS
5.0000 mg | ORAL_TABLET | ORAL | Status: DC | PRN
  Administered 2019-09-05: 10 mg via ORAL
  Administered 2019-09-06: 5 mg via ORAL
  Administered 2019-09-06: 10 mg via ORAL
  Administered 2019-09-07: 5 mg via ORAL
  Administered 2019-09-07: 10 mg via ORAL
  Filled 2019-09-05: qty 1
  Filled 2019-09-05 (×4): qty 2

## 2019-09-05 MED ORDER — SENNA 8.6 MG PO TABS
1.0000 | ORAL_TABLET | Freq: Two times a day (BID) | ORAL | Status: DC
  Administered 2019-09-05 – 2019-09-07 (×5): 8.6 mg via ORAL
  Filled 2019-09-05 (×5): qty 1

## 2019-09-05 SURGICAL SUPPLY — 82 items
ATCH SMKEVC FLXB CAUT HNDSWH (FILTER) ×2 IMPLANT
BAG DECANTER FOR FLEXI CONT (MISCELLANEOUS) ×3 IMPLANT
BINDER BREAST LRG (GAUZE/BANDAGES/DRESSINGS) ×3 IMPLANT
BINDER BREAST XLRG (GAUZE/BANDAGES/DRESSINGS) IMPLANT
BIOPATCH RED 1 DISK 7.0 (GAUZE/BANDAGES/DRESSINGS) ×6 IMPLANT
BNDG COHESIVE 4X5 TAN STRL (GAUZE/BANDAGES/DRESSINGS) ×3 IMPLANT
CANISTER SUCT 3000ML PPV (MISCELLANEOUS) ×3 IMPLANT
CHLORAPREP W/TINT 26 (MISCELLANEOUS) ×3 IMPLANT
CLIP VESOCCLUDE LG 6/CT (CLIP) ×3 IMPLANT
CLIP VESOCCLUDE MED 6/CT (CLIP) ×3 IMPLANT
CLIP VESOCCLUDE SM WIDE 6/CT (CLIP) ×3 IMPLANT
CNTNR URN SCR LID CUP LEK RST (MISCELLANEOUS) ×2 IMPLANT
CONT SPEC 4OZ STRL OR WHT (MISCELLANEOUS) ×3
COVER PROBE W GEL 5X96 (DRAPES) ×3 IMPLANT
COVER SURGICAL LIGHT HANDLE (MISCELLANEOUS) ×3 IMPLANT
COVER TRANSDUCER ULTRASND GEL (DISPOSABLE) IMPLANT
COVER WAND RF STERILE (DRAPES) ×3 IMPLANT
DECANTER SPIKE VIAL GLASS SM (MISCELLANEOUS) ×6 IMPLANT
DERMABOND ADVANCED (GAUZE/BANDAGES/DRESSINGS) ×2
DERMABOND ADVANCED .7 DNX12 (GAUZE/BANDAGES/DRESSINGS) ×4 IMPLANT
DRAIN CHANNEL 19F RND (DRAIN) ×3 IMPLANT
DRAPE C-ARM 42X120 X-RAY (DRAPES) ×3 IMPLANT
DRAPE CHEST BREAST 15X10 FENES (DRAPES) ×3 IMPLANT
DRAPE WARM FLUID 44X44 (DRAPES) IMPLANT
DRSG TEGADERM 4X4.75 (GAUZE/BANDAGES/DRESSINGS) ×6 IMPLANT
ELECT BLADE 4.0 EZ CLEAN MEGAD (MISCELLANEOUS) ×3
ELECT CAUTERY BLADE 6.4 (BLADE) ×3 IMPLANT
ELECT COATED BLADE 2.86 ST (ELECTRODE) ×3 IMPLANT
ELECT REM PT RETURN 9FT ADLT (ELECTROSURGICAL) ×3
ELECTRODE BLDE 4.0 EZ CLN MEGD (MISCELLANEOUS) ×2 IMPLANT
ELECTRODE REM PT RTRN 9FT ADLT (ELECTROSURGICAL) ×2 IMPLANT
EVACUATOR SILICONE 100CC (DRAIN) ×6 IMPLANT
EVACUATOR SMOKE ACCUVAC VALLEY (FILTER) ×3
FILTER STRAW FLUID ASPIR (MISCELLANEOUS) IMPLANT
GAUZE 4X4 16PLY RFD (DISPOSABLE) ×3 IMPLANT
GAUZE SPONGE 4X4 12PLY STRL (GAUZE/BANDAGES/DRESSINGS) ×3 IMPLANT
GEL ULTRASOUND 20GR AQUASONIC (MISCELLANEOUS) IMPLANT
GLOVE BIO SURGEON STRL SZ 6 (GLOVE) ×6 IMPLANT
GLOVE INDICATOR 6.5 STRL GRN (GLOVE) ×3 IMPLANT
GOWN STRL REUS W/ TWL LRG LVL3 (GOWN DISPOSABLE) ×8 IMPLANT
GOWN STRL REUS W/TWL 2XL LVL3 (GOWN DISPOSABLE) ×3 IMPLANT
GOWN STRL REUS W/TWL LRG LVL3 (GOWN DISPOSABLE) ×12
HEMOSTAT HEMOBLAST BELLOWS (HEMOSTASIS) ×3 IMPLANT
KIT BASIN OR (CUSTOM PROCEDURE TRAY) ×3 IMPLANT
KIT PORT POWER 8FR ISP CVUE (Port) ×3 IMPLANT
KIT TURNOVER KIT B (KITS) ×3 IMPLANT
LIGHT WAVEGUIDE WIDE FLAT (MISCELLANEOUS) ×3 IMPLANT
MARKER SKIN DUAL TIP RULER LAB (MISCELLANEOUS) ×3 IMPLANT
NEEDLE 18GX1X1/2 (RX/OR ONLY) (NEEDLE) IMPLANT
NEEDLE 22X1 1/2 (OR ONLY) (NEEDLE) ×3 IMPLANT
NEEDLE HYPO 25GX1X1/2 BEV (NEEDLE) IMPLANT
NEEDLE SPNL 22GX3.5 QUINCKE BK (NEEDLE) ×3 IMPLANT
NS IRRIG 1000ML POUR BTL (IV SOLUTION) ×3 IMPLANT
PACK GENERAL/GYN (CUSTOM PROCEDURE TRAY) ×3 IMPLANT
PACK UNIVERSAL I (CUSTOM PROCEDURE TRAY) ×3 IMPLANT
PAD ABD 8X10 STRL (GAUZE/BANDAGES/DRESSINGS) ×6 IMPLANT
PAD ARMBOARD 7.5X6 YLW CONV (MISCELLANEOUS) ×3 IMPLANT
PENCIL BUTTON HOLSTER BLD 10FT (ELECTRODE) ×3 IMPLANT
PENCIL SMOKE EVACUATOR (MISCELLANEOUS) ×3 IMPLANT
POSITIONER HEAD DONUT 9IN (MISCELLANEOUS) ×3 IMPLANT
PREFILTER EVAC NS 1 1/3-3/8IN (MISCELLANEOUS) ×3 IMPLANT
SPECIMEN JAR X LARGE (MISCELLANEOUS) ×3 IMPLANT
STAPLER VISISTAT 35W (STAPLE) ×3 IMPLANT
STOCKINETTE IMPERVIOUS 9X36 MD (GAUZE/BANDAGES/DRESSINGS) ×3 IMPLANT
STRIP CLOSURE SKIN 1/2X4 (GAUZE/BANDAGES/DRESSINGS) ×3 IMPLANT
SUT ETHILON 2 0 FS 18 (SUTURE) ×3 IMPLANT
SUT MON AB 4-0 PC3 18 (SUTURE) ×3 IMPLANT
SUT PROLENE 2 0 SH DA (SUTURE) ×6 IMPLANT
SUT SILK 2 0 (SUTURE) ×3
SUT SILK 2 0 PERMA HAND 18 BK (SUTURE) ×3 IMPLANT
SUT SILK 2-0 18XBRD TIE 12 (SUTURE) ×2 IMPLANT
SUT VIC AB 3-0 SH 27 (SUTURE) ×3
SUT VIC AB 3-0 SH 27X BRD (SUTURE) ×2 IMPLANT
SUT VIC AB 3-0 SH 8-18 (SUTURE) ×3 IMPLANT
SYR 50ML LL SCALE MARK (SYRINGE) ×3 IMPLANT
SYR 5ML LUER SLIP (SYRINGE) ×3 IMPLANT
SYR CONTROL 10ML LL (SYRINGE) IMPLANT
TOWEL GREEN STERILE (TOWEL DISPOSABLE) ×3 IMPLANT
TOWEL GREEN STERILE FF (TOWEL DISPOSABLE) ×3 IMPLANT
TRAY LAPAROSCOPIC MC (CUSTOM PROCEDURE TRAY) ×3 IMPLANT
TUBE CONNECTING 12X1/4 (SUCTIONS) ×3 IMPLANT
YANKAUER SUCT BULB TIP NO VENT (SUCTIONS) IMPLANT

## 2019-09-05 NOTE — Discharge Instructions (Signed)
[facility] [place] [facility] [facility] [place] °[phone] ° °MASTECTOMY: POST OP INSTRUCTIONS ° °Always review your discharge instruction sheet given to you by the facility where your surgery was performed. °IF YOU HAVE DISABILITY OR FAMILY LEAVE FORMS, YOU MUST BRING THEM TO THE OFFICE FOR PROCESSING.   °DO NOT GIVE THEM TO YOUR DOCTOR. °A prescription for pain medication may be given to you upon discharge.  Take your pain medication as prescribed, if needed.  If narcotic pain medicine is not needed, then you may take acetaminophen (Tylenol) or ibuprofen (Advil) as needed. °1. Take your usually prescribed medications unless otherwise directed. °2. If you need a refill on your pain medication, please contact your pharmacy.  They will contact our office to request authorization.  Prescriptions will not be filled after 5pm or on week-ends. °3. You should follow a light diet the first few days after arrival home, such as soup and crackers, etc.  Resume your normal diet the day after surgery. °4. Most patients will experience some swelling and bruising on the chest and underarm.  Ice packs will help.  Swelling and bruising can take several days to resolve.  °5. It is common to experience some constipation if taking pain medication after surgery.  Increasing fluid intake and taking a stool softener (such as Colace) will usually help or prevent this problem from occurring.  A mild laxative (Milk of Magnesia or Miralax) should be taken according to package instructions if there are no bowel movements after 48 hours. °6. Unless discharge instructions indicate otherwise, leave your bandage dry and in place until your next appointment in 3-5 days.  You may take a limited sponge bath.  No tube baths or showers until the drains are removed.  You may have steri-strips (small skin tapes) in place directly over the incision.  These strips should be left on the skin for 7-10 days.  If your surgeon used skin glue on the incision, you may  shower in 24 hours.  The glue will flake off over the next 2-3 weeks.  Any sutures or staples will be removed at the office during your follow-up visit. °7. DRAINS:  If you have drains in place, it is important to keep a list of the amount of drainage produced each day in your drains.  Before leaving the hospital, you should be instructed on drain care.  Call our office if you have any questions about your drains. °8. ACTIVITIES:  You may resume regular (light) daily activities beginning the next day--such as daily self-care, walking, climbing stairs--gradually increasing activities as tolerated.  You may have sexual intercourse when it is comfortable.  Refrain from any heavy lifting or straining until approved by your doctor. °a. You may drive when you are no longer taking prescription pain medication, you can comfortably wear a seatbelt, and you can safely maneuver your car and apply brakes. °b. RETURN TO WORK:  __________________________________________________________ °9. You should see your doctor in the office for a follow-up appointment approximately 3-5 days after your surgery.  Your doctor’s nurse will typically make your follow-up appointment when she calls you with your pathology report.  Expect your pathology report 2-3 business days after your surgery.  You may call to check if you do not hear from us after three days.   °10. OTHER INSTRUCTIONS: ______________________________________________________________________________________________ ____________________________________________________________________________________________ ° °WHEN TO CALL YOUR DOCTOR: °1. Fever over 101.0 °2. Nausea and/or vomiting °3. Extreme swelling or bruising °4. Continued bleeding from incision. °5. Increased pain, redness, or drainage from the   The clinic staff is available to answer your questions during regular business hours.  Please don't hesitate to call and ask to speak to one of the nurses for clinical  concerns.  If you have a medical emergency, go to the nearest emergency room or call 911.  A surgeon from [facility] is always on call at the hospital. [address] [place], [place], [place]  [address] ? [address] [place], [place], [place]   [address] [phone] ? [phone] ? FAX [phone]    [facility] drains are used to remove extra fluid that normally builds up in a surgical wound after surgery. A surgical drain helps to heal a surgical wound. Different kinds of surgical drains include:  Active drains. These drains use suction to pull drainage away from the surgical wound. Drainage flows through a tube to a container outside of the body. With these drains, you need to keep the bulb or the drainage container flat (compressed) at all times, except while you empty it. Flattening the bulb or container creates suction.  Passive drains. These drains allow fluid to drain naturally, by gravity. Drainage flows through a tube to a bandage (dressing) or a container outside of the body. Passive drains do not need to be emptied. A drain is placed during surgery. Right after surgery, drainage is usually bright red and a little thicker than water. The drainage may gradually turn yellow or pink and become thinner. It is likely that your health care provider will remove the drain when the drainage stops or when the amount decreases to 1-2 Tbsp (15-30 mL) during a 24-hour period. Supplies needed:  Tape.  Germ-free cleaning solution (sterile saline).  Cotton swabs.  Split gauze drain sponge: 4 x 4 inches (10 x 10 cm).  Gauze square: 4 x 4 inches (10 x 10 cm). How to care for your surgical drain Care for your drain as told by your health care provider. This is important to help prevent infection. If your drain is placed at your back, or any other hard-to-reach area, ask another person to assist you in performing the following tasks: General care  Keep the  skin around the drain dry and covered with a dressing at all times.  Check your drain area every day for signs of infection. Check for: ? Redness, swelling, or pain. ? Pus or a bad smell. ? Cloudy drainage. ? Tenderness or pressure at the drain exit site. Changing the dressing Follow instructions from your health care provider about how to change your dressing. Change your dressing at least once a day. Change it more often if needed to keep the dressing dry. Make sure you: 1. Gather your supplies. 2. Wash your hands with soap and water before you change your dressing. If soap and water are not available, use hand sanitizer. 3. Remove the old dressing. Avoid using scissors to do that. 4. Wash your hands with soap and water again after removing the old dressing. 5. Use sterile saline to clean your skin around the drain. You may need to use a cotton swab to clean the skin. 6. Place the tube through the slit in a drain sponge. Place the drain sponge so that it covers your wound. 7. Place the gauze square or another drain sponge on top of the drain sponge that is on the wound. Make sure the tube is between those layers. 8. Tape the dressing to your skin. 9. Tape the drainage tube to your  skin 1-2 inches (2.5-5 cm) below the place where the tube enters your body. Taping keeps the tube from pulling on any stitches (sutures) that you have. 10. Wash your hands with soap and water. 11. Write down the color of your drainage and how often you change your dressing. How to empty your active drain  1. Make sure that you have a measuring cup that you can empty your drainage into. 2. Wash your hands with soap and water. If soap and water are not available, use hand sanitizer. 3. Loosen any pins or clips that hold the tube in place. 4. If your health care provider tells you to strip the tube to prevent clots and tube blockages: ? Hold the tube at the skin with one hand. Use your other hand to pinch the  tubing with your thumb and first finger. ? Gently move your fingers down the tube while squeezing very lightly. This clears any drainage, clots, or tissue from the tube. ? You may need to do this several times each day to keep the tube clear. Do not pull on the tube. 5. Open the bulb cap or the drain plug. Do not touch the inside of the cap or the bottom of the plug. 6. Turn the device upside down and gently squeeze. 7. Empty all of the drainage into the measuring cup. 8. Compress the bulb or the container and replace the cap or the plug. To compress the bulb or the container, squeeze it firmly in the middle while you close the cap or plug the container. 9. Write down the amount of drainage that you have in each 24-hour period. If you have less than 2 Tbsp (30 mL) of drainage during 24 hours, contact your health care provider. 10. Flush the drainage down the toilet. 11. Wash your hands with soap and water. Contact a health care provider if:  You have redness, swelling, or pain around your drain area.  You have pus or a bad smell coming from your drain area.  You have a fever or chills.  The skin around your drain is warm to the touch.  The amount of drainage that you have is increasing instead of decreasing.  You have drainage that is cloudy.  There is a sudden stop or a sudden decrease in the amount of drainage that you have.  Your drain tube falls out.  Your active drain does not stay compressed after you empty it. Summary  Surgical drains are used to remove extra fluid that normally builds up in a surgical wound after surgery.  Different kinds of surgical drains include active drains and passive drains. Active drains use suction to pull drainage away from the surgical wound, and passive drains allow fluid to drain naturally.  It is important to care for your drain to prevent infection. If your drain is placed at your back, or any other hard-to-reach area, ask another person to  assist you.  Contact your health care provider if you have redness, swelling, or pain around your drain area. This information is not intended to replace advice given to you by your health care provider. Make sure you discuss any questions you have with your health care provider. Document Revised: 04/17/2018 Document Reviewed: 04/17/2018 Elsevier Patient Education  2020 [employer]   

## 2019-09-05 NOTE — Anesthesia Procedure Notes (Signed)
Procedure Name: Intubation Date/Time: 09/05/2019 7:54 AM Performed by: Kyung Rudd, CRNA Pre-anesthesia Checklist: Patient identified, Emergency Drugs available, Suction available and Patient being monitored Patient Re-evaluated:Patient Re-evaluated prior to induction Oxygen Delivery Method: Circle system utilized Preoxygenation: Pre-oxygenation with 100% oxygen Induction Type: IV induction Ventilation: Mask ventilation without difficulty Laryngoscope Size: Mac and 3 Grade View: Grade I Tube type: Oral Tube size: 7.0 mm Number of attempts: 1 Airway Equipment and Method: Stylet Placement Confirmation: ETT inserted through vocal cords under direct vision,  positive ETCO2 and breath sounds checked- equal and bilateral Secured at: 21 cm Tube secured with: Tape Dental Injury: Teeth and Oropharynx as per pre-operative assessment

## 2019-09-05 NOTE — Op Note (Signed)
Bilateral Mastectomy with Right Axillary Sentinel Node Biopsy, left subclavian port placement  Indications: This patient presents with history of right breast cancer,   Pre-operative Diagnosis: right breast cancer, grade 3 invasive ductal carcinoma, cT1cN0M0, upper outer quadrant, receptors +/-/+, second location of DCIs  Post-operative Diagnosis: same  Surgeon: Stark Klein   Assist:  Judyann Munson, RNFA  Anesthesia: General endotracheal anesthesia and pectoral block  ASA Class: 2  Procedure Details  The patient was seen in the Holding Room. The risks, benefits, complications, treatment options, and expected outcomes were discussed with the patient. The possibilities of reaction to medication, pulmonary aspiration, bleeding, infection, the need for additional procedures, failure to diagnose a condition, and creating a complication requiring transfusion or operation were discussed with the patient. The patient concurred with the proposed plan, giving informed consent.  The site of surgery properly noted/marked. The patient was taken to Operating Room # 8, identified as Justyna Timoney and the procedure verified as Bilateral Mastectomy and left Sentinel Node Biopsy, port placement. A Time Out was held and the above information confirmed.    After induction of anesthesia, the left arm, bilateral breast, and chest were prepped and draped in standard fashion.  The left breast was addressed first.  The borders of the breast were identified and marked.  The incisions of the breast were drawn out to make sure incision lines were equidistant in length.    The superior incision was made with the #10 blade.  Mastectomy hooks were used to provide elevation of the skin edges, and the cautery was used to create the mastectomy flaps.  The dissection was taken to the fascia of the pectoralis major.  The penetrating vessels were clipped as needed.  The superior flap was taken medially to the lateral sternal  border, superiorly to the inferior border of the clavicle.  The inferior flap was similarly created, inferiorly to the inframammary fold and laterally to the border of the latissimus.  The breast was taken off including the pectoralis fascia and the axillary tail marked.    The port was then addressed.  The left subclavian vein was accessed with 2 pass(es) of the needle. There was good venous return and the wire passed easily with brief ectopy.   Fluoroscopy was used to confirm that the wire was in the vena cava.     The port was then secured to the pectoralis fascia under the superior mastectomy flap with four 2-0 Prolene sutures.  These were clamped and not tied down yet.    The catheter was tunneled through to the wire exit site.  The catheter was placed along the wire to determine what length it should be to be in the SVC.  The catheter was cut at 25 cm.  The tunneler sheath and dilator were passed over the wire and the dilator and wire were removed.  The catheter was advanced through the tunneler sheath and the tunneler sheath was pulled away.  Care was taken to keep the catheter in the tunneler sheath as this occurred. This was advanced and the tunneler sheath was removed.  There was good venous   return and easy flush of the catheter.  The Prolene sutures were tied   down to the pectoral fascia.  Fluoroscopy was used to re-confirm good position of the catheter.  The port was flushed with concentrated heparin flush as well.    The right breast was then addressed.  The incisions were measured and care was taken to have the  points of the incision at symmetric locations bilaterally.  The mastectomy was performed similarly.  The breast was taken off the fascia and the axillary tail marked.    Using a hand-held gamma probe, axillary sentinel nodes were identified.  Three deep level 2 axillary sentinel nodes were removed and submitted to pathology.  The findings are below.  The lymphovascular channels  were clipped with metal clips.        The wound was irrigated. One 19 Blake drain was placed laterally on each side and secured with 2-0 nylon suture. .   Hemostasis was achieved with cautery.  Hemoblast was then placed on each side.  The wound was irrigated and closed with a 3-0 Vicryl deep dermal interrupted sutures and 4-0 Vicryl subcuticular closure in layers.    Sterile dressings were applied. At the end of the operation, all sponge, instrument, and needle counts were correct.  Findings: grossly clear surgical margins, skin dimpling at site of cancer in lateral right breast.  No enlarged nodes. SLN #1 hot, cps 35; SLN #2 hot, cps 22, SLN #3 palpable.  Background 0  Estimated Blood Loss: min          Drains: 19 Fr blake drain in each axilla                Specimens: Left breast, right breast,  and three right axillary sentinel nodes         Complications:  None; patient tolerated the procedure well.         Disposition: PACU - hemodynamically stable.         Condition: stable

## 2019-09-05 NOTE — Interval H&P Note (Signed)
History and Physical Interval Note:  09/05/2019 7:38 AM  Stephanie Chase  has presented today for surgery, with the diagnosis of RIGHT BREAST CANCER, STRONG FAMILY HISTORY OF BREAST CANCER.  The various methods of treatment have been discussed with the patient and family. After consideration of risks, benefits and other options for treatment, the patient has consented to  Procedure(s) with comments: BILATERAL MASTECTOMY WITH RIGHT SENTINEL LYMPH NODE BIOPSY (Bilateral) - COMBINED WITH REGIONAL FOR POST OP PAIN INSERTION PORT-A-CATH WITH ULTRASOUND GUIDANCE (N/A) as a surgical intervention.  The patient's history has been reviewed, patient examined, no change in status, stable for surgery.  I have reviewed the patient's chart and labs.  Questions were answered to the patient's satisfaction.     Stark Klein

## 2019-09-05 NOTE — Discharge Summary (Addendum)
Physician Discharge Summary  Patient ID: Stephanie Chase MRN: [id number] DOB/AGE: 11-20-1960 59 y.o.  Admit date: 09/05/2019 Discharge date: 09/07/2019  Admission Diagnoses: Patient Active Problem List   Diagnosis Date Noted  . Breast cancer of upper-outer quadrant of right female breast ([place]) 09/05/2019  . Genetic testing 08/12/2019  . Family history of BRCA gene mutation 07/31/2019  . Family history of breast cancer   . Family history of prostate cancer   . Family history of bladder cancer   . Malignant neoplasm of upper-outer quadrant of right breast in female, estrogen receptor positive ([place]) 07/24/2019  . Annual physical exam 03/31/2016  . Occupational bronchitis ([place]) 03/03/2015  . Hyperlipidemia 03/30/2014  . 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 Generalized osteoarthritis 02/23/2012  . Plantar fasciitis, bilateral 02/23/2012  . Vitiligo 02/23/2012  . Difficulty hearing 02/13/2012  . History of lupus 12/21/2011  . Vitamin D deficiency 10/11/2010  . Hypothyroidism 06/21/2010  . Systemic lupus erythematosus ([place]) 06/14/2010    Discharge Diagnoses:  Active Problems:   Breast cancer of upper-outer quadrant of right female breast ([place]) Anxiety Lupus Tobacco abuse  Discharged Condition: stable  Surgery: BilateralMastectomy with Right AxillarySentinel Node Biopsy, left subclavian port placement - Dr. Barry Dienes - 09/05/2019  Hospital Course:  Pt was admitted to the floor following bilateral mastectomies with right sentinel lymph node biopsy and left subclavian port 09/05/2019 for right breast cancer.  Patient was noted to have ABL on labs POD #1. Hgb stabilized on labs POD #2. She did not require any blood transfusion. She had drain teaching.  On POD 2, the patient was voiding well, tolerating diet, ambulating well, pain well controlled, vital signs stable, incisions c/d/i and felt stable for discharge home. Follow up with Dr. Barry Dienes.   Consults: None  Treatments: surgery: see  above  Discharge Exam: Blood pressure 101/67, pulse 69, temperature 98.6 F (37 C), temperature source Oral, resp. rate 16, height '5\' 5"'  (1.651 m), weight 71.7 kg, SpO2 92 %. Gen:  Alert, NAD, pleasant Card:  RRR, no M/G/R heard Pulm:  CTAB, no W/R/R, effort normal Chest wall: Right mastectomy incision clean and dry. Small hematoma under flap. Drain with ss output. 110cc/24 hours. Left mastectomy incision clean and dry. Small hematoma under flap. Drain with ss output. 50cc/24 hours. Abd: Soft, NT/ND, +BS Ext:  No LE edema  Psych: A&Ox3  Skin: no rashes noted, warm and dry  Disposition: Discharge disposition: 01-Home or Self Care        Allergies as of 09/07/2019   No Known Allergies     Medication List    TAKE these medications   acetaminophen 500 MG tablet Commonly known as: TYLENOL Take 1,000 mg by mouth every 6 (six) hours as needed for moderate pain.   Albuterol Sulfate 108 (90 Base) MCG/ACT Aepb Commonly known as: ProAir RespiClick Inhale 2 puffs into the lungs every 6 (six) hours as needed. What changed: reasons to take this   ALPRAZolam 0.25 MG tablet Commonly known as: XANAX Take 1 tablet (0.25 mg total) by mouth 2 (two) times daily as needed for anxiety.   cetirizine 10 MG tablet Commonly known as: ZYRTEC Take 10 mg by mouth daily as needed for allergies.   cholecalciferol 25 MCG (1000 UNIT) tablet Commonly known as: VITAMIN D3 Take 1,000 Units by mouth daily.   Delsym 30 MG/5ML liquid Generic drug: dextromethorphan Take 60 mg by mouth 2 (two) times daily as needed for cough.   fluticasone 50 MCG/ACT nasal spray Commonly known as: FLONASE  Place 1 spray into both nostrils daily as needed for allergies.   liothyronine 5 MCG tablet Commonly known as: CYTOMEL TAKE 2 TABLETS (=10MCG     TOTAL)     DAILY What changed:   how much to take  how to take this  when to take this   methocarbamol 500 MG tablet Commonly known as: ROBAXIN Take 1 tablet  (500 mg total) by mouth every 6 (six) hours as needed (use for muscle cramps/pain).   OVER THE COUNTER MEDICATION Take 1 each by mouth 2 (two) times daily as needed (anxiety / sleep). CBD Gummies 10 mg   oxyCODONE 5 MG immediate release tablet Commonly known as: Oxy IR/ROXICODONE Take 1 tablet (5 mg total) by mouth every 6 (six) hours as needed for breakthrough pain.   Synthroid 75 MCG tablet Generic drug: levothyroxine Take 75 mcg by mouth daily before breakfast.   vitamin C 1000 MG tablet Take 1,000 mg by mouth daily.       Follow-up Information    Stark Klein, MD In 2 weeks.   Specialty: General Surgery Contact information: [address] [place] [address] [phone]               Signed: Barth Kirks [facility] 09/07/2019, 8:04 AM

## 2019-09-05 NOTE — Progress Notes (Signed)
Pt requesting Nicotene patch, order obtained.

## 2019-09-05 NOTE — Progress Notes (Signed)
Pt had some bleeding around the left JP drain, took down the binder and viewed the area, stripped the drain to make sure no clots and cleansed the area. No active bleeding noted, Placed 3 drain gauzes around JP insertion site and obtained additional binder for home so pt can have an extra and wash one/wear one.  Will continue to monitor the JP site.

## 2019-09-05 NOTE — Progress Notes (Signed)
Patient arrived to [facility] room 6. Pt transported via bed by OR nurses. Patient is alert and oriented x4 at this time. Pt reports moderate pain at surgical incision sites. Pt has JP drains in place on both surgical incisions draining sanguineous fluid bilaterally. Skin glue present on surgical incisions as well. Pt denies dizziness or shortness of breath at this time. Pt encouraged to call out for assistance to the bathroom. Daughter at bedside.

## 2019-09-05 NOTE — Anesthesia Procedure Notes (Signed)
Anesthesia Regional Block: Pectoralis block   Pre-Anesthetic Checklist: ,, timeout performed, Correct Patient, Correct Site, Correct Laterality, Correct Procedure, Correct Position, site marked, Risks and benefits discussed,  Surgical consent,  Pre-op evaluation,  At surgeon's request and post-op pain management  Laterality: Right  Prep: chloraprep       Needles:  Injection technique: Single-shot  Needle Type: Echogenic Needle     Needle Length: 9cm  Needle Gauge: 21     Additional Needles:   Procedures:,,,, ultrasound used (permanent image in chart),,,,  Narrative:  Start time: 09/05/2019 7:01 AM End time: 09/05/2019 7:10 AM Injection made incrementally with aspirations every 5 mL.  Performed by: Personally  Anesthesiologist: Barnet Glasgow, MD  Additional Notes: Block assessed. Patient tolerated procedure well.

## 2019-09-05 NOTE — Anesthesia Procedure Notes (Signed)
Anesthesia Regional Block: Pectoralis block   Pre-Anesthetic Checklist: ,, timeout performed, Correct Patient, Correct Site, Correct Laterality, Correct Procedure, Correct Position, site marked, Risks and benefits discussed,  Surgical consent,  Pre-op evaluation,  At surgeon's request and post-op pain management  Laterality: Left  Prep: chloraprep       Needles:  Injection technique: Single-shot  Needle Type: Echogenic Needle     Needle Length: 9cm  Needle Gauge: 21     Additional Needles:   Procedures:,,,, ultrasound used (permanent image in chart),,,,  Narrative:  Start time: 09/05/2019 7:11 AM End time: 09/05/2019 7:18 AM Injection made incrementally with aspirations every 5 mL.  Performed by: Personally  Anesthesiologist: Barnet Glasgow, MD  Additional Notes: Block assessed. Patient tolerated procedure well.

## 2019-09-05 NOTE — Anesthesia Postprocedure Evaluation (Signed)
Anesthesia Post Note  Patient: Stephanie Chase  Procedure(s) Performed: BILATERAL MASTECTOMY WITH RIGHT SENTINEL LYMPH NODE BIOPSY (Bilateral Breast) INSERTION PORT-A-CATH WITH ULTRASOUND GUIDANCE (Left Chest)     Patient location during evaluation: PACU Anesthesia Type: General Level of consciousness: awake and alert Pain management: pain level controlled Vital Signs Assessment: post-procedure vital signs reviewed and stable Respiratory status: spontaneous breathing, nonlabored ventilation, respiratory function stable and patient connected to nasal cannula oxygen Cardiovascular status: blood pressure returned to baseline and stable Postop Assessment: no apparent nausea or vomiting Anesthetic complications: no   No complications documented.  Last Vitals:  Vitals:   09/05/19 1130 09/05/19 1217  BP: 139/86 126/80  Pulse: 66 64  Resp: 16 15  Temp: 37.1 C 36.6 C  SpO2: 99% 95%    Last Pain:  Vitals:   09/05/19 1217  TempSrc: Oral  PainSc:                  Barnet Glasgow

## 2019-09-05 NOTE — Plan of Care (Signed)
Reviewed JP drain care with pt and family. Family member has had JP drains in the recent past so understands how to empty and strip them.  Breast cancer bag given and reviewed contents.

## 2019-09-05 NOTE — Transfer of Care (Signed)
Immediate Anesthesia Transfer of Care Note  Patient: Stephanie Chase  Procedure(s) Performed: BILATERAL MASTECTOMY WITH RIGHT SENTINEL LYMPH NODE BIOPSY (Bilateral Breast) INSERTION PORT-A-CATH WITH ULTRASOUND GUIDANCE (Left Chest)  Patient Location: PACU  Anesthesia Type:GA combined with regional for post-op pain  Level of Consciousness: awake, alert  and oriented  Airway & Oxygen Therapy: Patient Spontanous Breathing and Patient connected to nasal cannula oxygen  Post-op Assessment: Report given to RN, Post -op Vital signs reviewed and stable and Patient moving all extremities  Post vital signs: Reviewed and stable  Last Vitals:  Vitals Value Taken Time  BP 143/87 09/05/19 1042  Temp    Pulse 67 09/05/19 1042  Resp 22 09/05/19 1042  SpO2 99 % 09/05/19 1042  Vitals shown include unvalidated device data.  Last Pain:  Vitals:   09/05/19 0621  PainSc: 0-No pain      Patients Stated Pain Goal: 3 (39/03/00 9233)  Complications: No complications documented.

## 2019-09-06 DIAGNOSIS — C50411 Malignant neoplasm of upper-outer quadrant of right female breast: Secondary | ICD-10-CM | POA: Diagnosis not present

## 2019-09-06 LAB — CBC
HCT: 34.9 % — ABNORMAL LOW (ref 36.0–46.0)
Hemoglobin: 11.4 g/dL — ABNORMAL LOW (ref 12.0–15.0)
MCH: 28.9 pg (ref 26.0–34.0)
MCHC: 32.7 g/dL (ref 30.0–36.0)
MCV: 88.6 fL (ref 80.0–100.0)
Platelets: 247 10*3/uL (ref 150–400)
RBC: 3.94 MIL/uL (ref 3.87–5.11)
RDW: 12.9 % (ref 11.5–15.5)
WBC: 11.2 10*3/uL — ABNORMAL HIGH (ref 4.0–10.5)
nRBC: 0 % (ref 0.0–0.2)

## 2019-09-06 LAB — BASIC METABOLIC PANEL
Anion gap: 8 (ref 5–15)
BUN: <5 mg/dL — ABNORMAL LOW (ref 6–20)
CO2: 24 mmol/L (ref 22–32)
Calcium: 9.4 mg/dL (ref 8.9–10.3)
Chloride: 106 mmol/L (ref 98–111)
Creatinine, Ser: 0.52 mg/dL (ref 0.44–1.00)
GFR calc Af Amer: >60 mL/min (ref >60)
GFR calc non Af Amer: >60 mL/min (ref >60)
Glucose, Bld: 109 mg/dL — ABNORMAL HIGH (ref 70–99)
Potassium: 4 mmol/L (ref 3.5–5.1)
Sodium: 138 mmol/L (ref 135–145)

## 2019-09-06 NOTE — Progress Notes (Signed)
1 Day Post-Op  Subjective: CC: Doing well. Some mild discomfort under armpits where binder is sitting. Ambulating without difficulty in room. Voiding. Tolerating cld without n/v. Some leakage from left drain site that was dressed overnight. No other complaints.   Objective: Vital signs in last 24 hours: Temp:  [97.9 F (36.6 C)-98.7 F (37.1 C)] 98.3 F (36.8 C) (06/12 0455) Pulse Rate:  [57-83] 83 (06/12 0455) Resp:  [14-45] 18 (06/12 0455) BP: (102-151)/(66-91) 102/66 (06/12 0455) SpO2:  [93 %-100 %] 96 % (06/12 0455)    Intake/Output from previous day: 06/11 0701 - 06/12 0700 In: 2191 [P.O.:480; I.V.:1611; IV Piggyback:100] Out: 2536 [Urine:2251; Drains:235; Blood:50] Intake/Output this shift: No intake/output data recorded.  PE: Gen:  Alert, NAD, pleasant Card:  RRR, no M/G/R heard Pulm:  CTAB, no W/R/R, effort normal Chest wall: Right mastectomy incision clean and dry. Small hematoma under flap. Drain with bloody ss output. 155cc/24 hours. Left mastectomy incision clean and dry. Small hematoma under flap. Drain with bloody ss output. 80cc/24 hours. Abd: Soft, NT/ND, +BS Ext:  No LE edema  Psych: A&Ox3  Skin: no rashes noted, warm and dry   Lab Results:  Recent Labs    09/06/19 0251  WBC 11.2*  HGB 11.4*  HCT 34.9*  PLT 247   BMET Recent Labs    09/06/19 0251  NA 138  K 4.0  CL 106  CO2 24  GLUCOSE 109*  BUN <5*  CREATININE 0.52  CALCIUM 9.4   PT/INR No results for input(s): LABPROT, INR in the last 72 hours. CMP     Component Value Date/Time   NA 138 09/06/2019 0251   K 4.0 09/06/2019 0251   CL 106 09/06/2019 0251   CO2 24 09/06/2019 0251   GLUCOSE 109 (H) 09/06/2019 0251   BUN <5 (L) 09/06/2019 0251   CREATININE 0.52 09/06/2019 0251   CREATININE 0.71 07/30/2019 1210   CREATININE 0.66 09/12/2017 0747   CALCIUM 9.4 09/06/2019 0251   PROT 7.4 07/30/2019 1210   ALBUMIN 3.9 07/30/2019 1210   AST 24 07/30/2019 1210   ALT 22 07/30/2019  1210   ALKPHOS 112 07/30/2019 1210   BILITOT 0.3 07/30/2019 1210   GFRNONAA >60 09/06/2019 0251   GFRNONAA >60 07/30/2019 1210   GFRNONAA >89 09/29/2013 1424   GFRAA >60 09/06/2019 0251   GFRAA >60 07/30/2019 1210   GFRAA >89 09/29/2013 1424   Lipase     Component Value Date/Time   LIPASE 55 07/31/2012 0846       Studies/Results: NM Sentinel Node Inj-No Rpt (Breast)  Result Date: 09/05/2019 Sulfur colloid was injected by the nuclear medicine technologist for melanoma sentinel node.   DG CHEST PORT 1 VIEW  Result Date: 09/05/2019 CLINICAL DATA:  Port-A-Cath placement. EXAM: PORTABLE CHEST 1 VIEW COMPARISON:  10/16/2018 FINDINGS: Power port inserted from a left subclavian approach. Mild kinking of the 2 at the port attachment site. This may or may not be significant. Tip of the catheter is in the SVC 1 cm above the right atrium. No pneumothorax. Mild interstitial prominence without frank edema. No infiltrate, collapse or effusion. Presumed mastectomy changes with bilateral soft tissue drains. IMPRESSION: Port tip in the SVC 1 cm above the right atrium. Slight kink at port attachment site, significance uncertain. Bilateral soft tissue chest drains. Mild interstitial prominence. Otherwise negative. Electronically Signed   By: Nelson Chimes M.D.   On: 09/05/2019 11:44   DG Fluoro Guide CV Line-No Report  Result Date:  09/05/2019 Fluoroscopy was utilized by the requesting physician.  No radiographic interpretation.    Anti-infectives: Anti-infectives (From admission, onward)   Start     Dose/Rate Route Frequency Ordered Stop   09/05/19 1600  ceFAZolin (ANCEF) IVPB 2g/100 mL premix        2 g 200 mL/hr over 30 Minutes Intravenous Every 8 hours 09/05/19 1241 09/05/19 1623   09/05/19 0615  ceFAZolin (ANCEF) IVPB 2g/100 mL premix        2 g 200 mL/hr over 30 Minutes Intravenous On call to O.R. 09/05/19 0606 09/05/19 0815       Assessment/Plan Hypothyroidism - home meds    COPD/Asthma   Right breast cancer, grade 3 invasive ductal carcinoma, cT1cN0M0, upper outer quadrant, receptors +/-/+, second location of DCIs - S/p Bilateral Mastectomy with Right Axillary Sentinel Node Biopsy, left subclavian port placement - Dr. Barry Dienes - 09/05/2019 - POD #1 - Patient with hgb drop from 14.4 to 11.4. 235cc of bloody output combined between drains since placement with small hematoma's under flaps. Will plan to monitor overnight and recheck labs in AM - Mobilize - Pulm toilet   FEN - Reg VTE - SCDs ID - Ancef periop    LOS: 0 days    Jillyn Ledger , [facility] 09/06/2019, 8:35 AM Please see Amion for pager number during day hours 7:00am-4:30pm

## 2019-09-06 NOTE — Progress Notes (Signed)
Checked in with pt and made sure she had all the supplies she needed for home care. Her daughter knows how to empty JP drains and the pt has the chart to record the amount of drainage and take to the follow appt.

## 2019-09-06 NOTE — Plan of Care (Signed)
  Problem: Education: Goal: Knowledge of General Education information will improve Description: Including pain rating scale, medication(s)/side effects and non-pharmacologic comfort measures Outcome: Progressing   Problem: Health Behavior/Discharge Planning: Goal: Ability to manage health-related needs will improve Outcome: Progressing   Problem: Clinical Measurements: Goal: Ability to maintain clinical measurements within normal limits will improve Outcome: Progressing Goal: Will remain free from infection Outcome: Progressing Goal: Diagnostic test results will improve Outcome: Progressing Goal: Respiratory complications will improve Outcome: Progressing Goal: Cardiovascular complication will be avoided Outcome: Progressing   Problem: Activity: Goal: Risk for activity intolerance will decrease Outcome: Progressing   Problem: Nutrition: Goal: Adequate nutrition will be maintained Outcome: Progressing   Problem: Coping: Goal: Level of anxiety will decrease Outcome: Progressing   Problem: Elimination: Goal: Will not experience complications related to bowel motility Outcome: Progressing Goal: Will not experience complications related to urinary retention Outcome: Progressing   Problem: Pain Managment: Goal: General experience of comfort will improve Outcome: Progressing   Problem: Safety: Goal: Ability to remain free from injury will improve Outcome: Progressing   Problem: Skin Integrity: Goal: Risk for impaired skin integrity will decrease Outcome: Progressing   Problem: Education: Goal: Knowledge of disease or condition will improve Outcome: Progressing   Problem: Activity: Goal: Ability to maintain or regain function will improve Outcome: Progressing   Problem: Clinical Measurements: Goal: Postoperative complications will be avoided or minimized Outcome: Progressing   Problem: Self-Concept: Goal: Ability to verbalize positive feelings about self will  improve Outcome: Progressing   Problem: Pain Management: Goal: Expressions of feelings of enhanced comfort will increase Outcome: Progressing   Problem: Skin Integrity: Goal: Demonstration of wound healing without infection will improve Outcome: Progressing

## 2019-09-07 DIAGNOSIS — C50411 Malignant neoplasm of upper-outer quadrant of right female breast: Secondary | ICD-10-CM | POA: Diagnosis not present

## 2019-09-07 LAB — CBC
HCT: 34.1 % — ABNORMAL LOW (ref 36.0–46.0)
Hemoglobin: 11.2 g/dL — ABNORMAL LOW (ref 12.0–15.0)
MCH: 29.6 pg (ref 26.0–34.0)
MCHC: 32.8 g/dL (ref 30.0–36.0)
MCV: 90.2 fL (ref 80.0–100.0)
Platelets: 240 10*3/uL (ref 150–400)
RBC: 3.78 MIL/uL — ABNORMAL LOW (ref 3.87–5.11)
RDW: 13.2 % (ref 11.5–15.5)
WBC: 8.9 10*3/uL (ref 4.0–10.5)
nRBC: 0 % (ref 0.0–0.2)

## 2019-09-07 MED ORDER — METHOCARBAMOL 500 MG PO TABS: 500.0000 mg | ORAL_TABLET | Freq: Four times a day (QID) | ORAL | 1 refills | Status: DC | PRN

## 2019-09-07 MED ORDER — OXYCODONE HCL 5 MG PO TABS: 5.0000 mg | ORAL_TABLET | Freq: Four times a day (QID) | ORAL | 0 refills | Status: DC | PRN

## 2019-09-07 NOTE — Progress Notes (Signed)
Stephanie Chase to be D/C'd  per MD order. Discussed with the patient and all questions fully answered.  VSS, Skin clean, dry and intact without evidence of skin break down, no evidence of skin tears noted.  IV catheter discontinued intact. Site without signs and symptoms of complications. Dressing and pressure applied.  An After Visit Summary was printed and given to the patient.   D/c education completed with patient/family including follow up instructions, medication list, d/c activities limitations if indicated, with other d/c instructions as indicated by MD - patient able to verbalize understanding, all questions fully answered.   Patient instructed to return to ED, call 911, or call MD for any changes in condition.   Patient to be escorted via [place], and D/C home via private auto.

## 2019-09-07 NOTE — Plan of Care (Signed)
  Problem: Education: Goal: Knowledge of General Education information will improve Description: Including pain rating scale, medication(s)/side effects and non-pharmacologic comfort measures Outcome: Completed/Met   Problem: Health Behavior/Discharge Planning: Goal: Ability to manage health-related needs will improve Outcome: Completed/Met   Problem: Clinical Measurements: Goal: Ability to maintain clinical measurements within normal limits will improve Outcome: Completed/Met Goal: Will remain free from infection Outcome: Completed/Met Goal: Diagnostic test results will improve Outcome: Completed/Met Goal: Respiratory complications will improve Outcome: Completed/Met Goal: Cardiovascular complication will be avoided Outcome: Completed/Met   Problem: Activity: Goal: Risk for activity intolerance will decrease Outcome: Completed/Met   Problem: Nutrition: Goal: Adequate nutrition will be maintained Outcome: Completed/Met   Problem: Coping: Goal: Level of anxiety will decrease Outcome: Completed/Met   Problem: Elimination: Goal: Will not experience complications related to bowel motility Outcome: Completed/Met Goal: Will not experience complications related to urinary retention Outcome: Completed/Met   Problem: Pain Managment: Goal: General experience of comfort will improve Outcome: Completed/Met   Problem: Safety: Goal: Ability to remain free from injury will improve Outcome: Completed/Met   Problem: Skin Integrity: Goal: Risk for impaired skin integrity will decrease Outcome: Completed/Met   Problem: Education: Goal: Knowledge of disease or condition will improve Outcome: Completed/Met   Problem: Activity: Goal: Ability to maintain or regain function will improve Outcome: Completed/Met   Problem: Clinical Measurements: Goal: Postoperative complications will be avoided or minimized Outcome: Completed/Met   Problem: Self-Concept: Goal: Ability to  verbalize positive feelings about self will improve Outcome: Completed/Met   Problem: Pain Management: Goal: Expressions of feelings of enhanced comfort will increase Outcome: Completed/Met   Problem: Skin Integrity: Goal: Demonstration of wound healing without infection will improve Outcome: Completed/Met

## 2019-09-08 ENCOUNTER — Encounter (HOSPITAL_COMMUNITY): Payer: Self-pay | Admitting: General Surgery

## 2019-09-08 LAB — SURGICAL PATHOLOGY

## 2019-09-10 ENCOUNTER — Encounter: Payer: Self-pay | Admitting: *Deleted

## 2019-09-11 NOTE — Progress Notes (Signed)
Stephanie Chase   [phone] [phone]   Clinic Follow up Note   Patient Care Team: Delsa Bern, MD as PCP - General (Obstetrics and Gynecology) Richrd Prime as Consulting Physician (Obstetrics and Gynecology) Mauro Kaufmann, RN as Oncology Nurse Navigator Rockwell Germany, RN as Oncology Nurse Navigator Stark Klein, MD as Consulting Physician (General Surgery) Truitt Merle, MD as Consulting Physician (Hematology) Kyung Rudd, MD as Consulting Physician (Radiation Oncology) Register, Luetta Nutting, PA-C as Physician Assistant (Dermatology)  Date of Service:  09/23/2019  CHIEF COMPLAINT: F/u of right breast cancer   SUMMARY OF ONCOLOGIC HISTORY: Oncology History Overview Note  Cancer Staging Malignant neoplasm of upper-outer quadrant of right breast in female, estrogen receptor positive ([place]) Staging form: Breast, AJCC 8th Edition - Clinical stage from 07/30/2019: Stage IA (cT1c, cN0, cM0, G3, ER+, PR-, HER2+) - Unsigned    Malignant neoplasm of upper-outer quadrant of right breast in female, estrogen receptor positive ([place])  07/03/2019 Mammogram   Diagnostic Mammogram  IMPRESSION There is a 1.3x1.1cm focal asymmetry in the retroareolar anterior to middle depth right breast 2.2cm from the nipple.  There is a 2 cm focal asymmetry with appearance in the upper outer quadrant posterior depth of right breast 3 cm from nipple -Both suspicious and biopsy recommended.     07/18/2019 Initial Biopsy   Diagnosis 07/18/19 1. Breast, right, needle core biopsy, 12 o'clock - INVASIVE DUCTAL CARCINOMA. 2. Breast, right, needle core biopsy, upper outer quadrant - DUCTAL CARCINOMA IN SITU. Microscopic Comment 1. The invasive carcinoma is nuclear grade 3. The greatest linear extent of tumor in any one core is 11 mm. Ancillary studies will be reported separately. CKAE1AE3, GATA-3, ER and E-cadherin are positive. PAX 8 is negative. CK7 is noncontributory. 2. The in situ  carcinoma is high nuclear grade with central necrosis and calcifications. E-cadherin is positive. P63, Calponin and SMM-1 demonstrate the present of myoepithelium.   07/18/2019 Receptors her2   1. PROGNOSTIC INDICATORS 07/18/19 Results: IMMUNOHISTOCHEMICAL AND MORPHOMETRIC ANALYSIS PERFORMED MANUALLY The tumor cells are POSITIVE for Her2 (3+). Of note, the tumor shows heterogeneity in regards to Her2 expression. Estrogen Receptor: 95%, POSITIVE, STRONG STAINING INTENSITY Progesterone Receptor: 0%, NEGATIVE Proliferation Marker Ki67: 50%   07/24/2019 Initial Diagnosis   Malignant neoplasm of upper-outer quadrant of right breast in female, estrogen receptor positive ([place])   08/01/2019 Breast MRI   IMPRESSION: 1. Known invasive ductal carcinoma measures 1.2 centimeters in the 12 o'clock location. 2. Non mass enhancement measures 2.3 centimeters in the LATERAL portion of the RIGHT breast corresponding to DCIS on biopsy. 3. An additional mass in the UPPER OUTER QUADRANT of the RIGHT breast is 1.5 centimeters and warrants tissue diagnosis. This area correlates with distortion seen mammographically. 4. No axillary adenopathy or ancillary findings. LEFT breast is negative.     08/04/2019 Echocardiogram   IMPRESSIONS     1. Left ventricular ejection fraction, by estimation, is 65 to 70%. The  left ventricle has normal function. The left ventricle has no regional  wall motion abnormalities. Left ventricular diastolic parameters were  normal. The average left ventricular  global longitudinal strain is -20.4 %.   2. Right ventricular systolic function is normal. The right ventricular  size is normal.   3. The mitral valve is normal in structure. No evidence of mitral valve  regurgitation. No evidence of mitral stenosis.   4. The aortic valve is normal in structure. Aortic valve regurgitation is  not visualized. No aortic stenosis is present.  08/10/2019 Genetic Testing   Negative genetic  testing:  No pathogenic variants detected on the [facility] Breast Cancer STAT panel + Common Hereditary Cancers panel. A variant of uncertain significance (VUS) was detected in the ATM gene called c.3834C>A. The report date is 08/10/2019.  The Breast Cancer STAT Panel offered by [facility] includes sequencing and deletion/duplication analysis for the following 9 genes:  ATM, BRCA1, BRCA2, CDH1, CHEK2, PALB2, PTEN, STK11 and TP53. The Common Hereditary Cancers Panel offered by [facility] includes sequencing and/or deletion duplication testing of the following 48 genes: APC, ATM, AXIN2, BARD1, BMPR1A, BRCA1, BRCA2, BRIP1, CDH1, CDK4, CDKN2A (p14ARF), CDKN2A (p16INK4a), CHEK2, CTNNA1, DICER1, EPCAM (Deletion/duplication testing only), GREM1 (promoter region deletion/duplication testing only), KIT, MEN1, MLH1, MSH2, MSH3, MSH6, MUTYH, NBN, NF1, NHTL1, PALB2, PDGFRA, PMS2, POLD1, POLE, PTEN, RAD50, RAD51C, RAD51D, RNF43, SDHB, SDHC, SDHD, SMAD4, SMARCA4. STK11, TP53, TSC1, TSC2, and VHL.  The following genes were evaluated for sequence changes only: SDHA and HOXB13 c.251G>A variant only.    09/05/2019 Surgery   BILATERAL MASTECTOMY WITH RIGHT SENTINEL LYMPH NODE BIOPSY and PAC placement by Dr Barry Dienes    09/05/2019 Pathology Results   FINAL MICROSCOPIC DIAGNOSIS:   A. BREAST, LEFT, MASTECTOMY:  - Benign breast tissue.   B. BREAST, RIGHT, MASTECTOMY:  - Invasive ductal carcinoma, multifocal, 1.4 cm in greatest dimension,  [place] grade 3 of 3.  - Ductal carcinoma in situ, high nuclear grade with central necrosis and  calcifications.  - Margins of resection are not involved.  - Biopsy sites.  - See oncology table.   C. SENTINEL LYMPH NODE, RIGHT AXILLARY #1, BIOPSY:  - One lymph node, negative for carcinoma (0/1).   D. SENTINEL LYMPH NODE, RIGHT AXILLARY #2, BIOPSY:  - One lymph node, negative for carcinoma (0/1).   E. SENTINEL LYMPH NODE, RIGHT AXILLARY #3, BIOPSY:  - One lymph node, negative for  carcinoma (0/1).     Estrogen Receptor: 95%, positive, strong             Progesterone Receptor: 0%, negative             HER2: Positive (3+)             Ki-67: 50%    09/05/2019 Cancer Staging   Staging form: Breast, AJCC 8th Edition - Pathologic stage from 09/05/2019: Stage IA (pT1c, pN0, cM0, G3, ER+, PR-, HER2+) - Signed by Gardenia Phlegm, NP on 09/17/2019    Chemotherapy   Weekly Taxol and Herceptin for 12 weeks and continue maintenance Herceptin q3weeks alone to complete 1 year treatment.       CURRENT THERAPY:  Weekly Taxol with Herceptin for 12 weeks PENDING to start in 2 weeks.   INTERVAL HISTORY:  Stephanie Chase is here for a follow up of right breast cancer after surgery. She presents to the clinic with her daughter on the phone and sister in person. She notes her surgery went well. She feels she is healing well. She still has tenderness and soreness of her chest. She has not required pain medications. She is walking well. Her draining tubes were removed on 09/15/19.  She notes compression bras lead to lumps of her axilla. She has not ben wearing bras currently. She has been able to sleep on her back and her left side. She had her PAC placed during surgery which went well. She notes she is able to eat well. She plans to f/u with Dr Barry Dienes today. She notes her gums are receding since yesterday which has  happened before. This is causing her pain. She has been seen by Dental specialist before for this and wonders if she can return for any treatment.  She notes she is currently smoking 1/2ppd. She has struggled quitting. She is apprehensive about steroid premedication. She notes her daughters are on the [place] and her Sister is in [place]. Her sister plans to go back and forth but stay in town for as long as she can. She does not live with her boyfriend.    REVIEW OF SYSTEMS:   Constitutional: Denies fevers, chills or abnormal weight loss Eyes: Denies blurriness of  vision Ears, nose, mouth, throat, and face: Denies mucositis or sore throat Respiratory: Denies cough, dyspnea or wheezes Cardiovascular: Denies palpitation, chest discomfort or lower extremity swelling Gastrointestinal:  Denies nausea, heartburn or change in bowel habits Skin: Denies abnormal skin rashes Lymphatics: Denies new lymphadenopathy or easy bruising Neurological:Denies numbness, tingling or new weaknesses Behavioral/Psych: Mood is stable, no new changes  Breast: (+) Tenderness of chest wall All other systems were reviewed with the patient and are negative.  MEDICAL HISTORY:  Past Medical History:  Diagnosis Date  . Anxiety   . Asthma   . COPD (chronic obstructive pulmonary disease) ([place])   . Family history of bladder cancer   . Family history of BRCA gene mutation   . Family history of breast cancer   . Family history of prostate cancer   . Hypothyroidism   . Lupus ([place])   . Thyroid disease     SURGICAL HISTORY: Past Surgical History:  Procedure Laterality Date  . BREAST LUMPECTOMY    . MASTECTOMY W/ SENTINEL NODE BIOPSY Bilateral 09/05/2019   Procedure: BILATERAL MASTECTOMY WITH RIGHT SENTINEL LYMPH NODE BIOPSY;  Surgeon: Stark Klein, MD;  Location: [facility];  Service: General;  Laterality: Bilateral;  COMBINED WITH REGIONAL FOR POST OP PAIN  . PORTACATH PLACEMENT Left 09/05/2019   Procedure: INSERTION PORT-A-CATH WITH ULTRASOUND GUIDANCE;  Surgeon: Stark Klein, MD;  Location: [facility];  Service: General;  Laterality: Left;  . TONSILLECTOMY    . WISDOM TOOTH EXTRACTION      I have reviewed the social history and family history with the patient and they are unchanged from previous note.  ALLERGIES:  has No Known Allergies.  MEDICATIONS:  Current Outpatient Medications  Medication Sig Dispense Refill  . acetaminophen (TYLENOL) 500 MG tablet Take 1,000 mg by mouth every 6 (six) hours as needed for moderate pain.    . Albuterol Sulfate (PROAIR RESPICLICK) 778 (90  Base) MCG/ACT AEPB Inhale 2 puffs into the lungs every 6 (six) hours as needed. (Patient taking differently: Inhale 2 puffs into the lungs every 6 (six) hours as needed (SOB). ) 1 each 3  . ALPRAZolam (XANAX) 0.25 MG tablet Take 1 tablet (0.25 mg total) by mouth 2 (two) times daily as needed for anxiety. 20 tablet 0  . Ascorbic Acid (VITAMIN C) 1000 MG tablet Take 1,000 mg by mouth daily.    . cetirizine (ZYRTEC) 10 MG tablet Take 10 mg by mouth daily as needed for allergies.     . cholecalciferol (VITAMIN D3) 25 MCG (1000 UNIT) tablet Take 1,000 Units by mouth daily.    Marland Kitchen dextromethorphan (DELSYM) 30 MG/5ML liquid Take 60 mg by mouth 2 (two) times daily as needed for cough.    . fluticasone (FLONASE) 50 MCG/ACT nasal spray Place 1 spray into both nostrils daily as needed for allergies.     Marland Kitchen levothyroxine (SYNTHROID) 75 MCG tablet  Take 75 mcg by mouth daily before breakfast.    . liothyronine (CYTOMEL) 5 MCG tablet TAKE 2 TABLETS (=10MCG     TOTAL)     DAILY (Patient taking differently: Take 10 mcg by mouth daily. TAKE 2 TABLETS (=10MCG     TOTAL)     DAILY) 180 tablet 1  . methocarbamol (ROBAXIN) 500 MG tablet Take 1 tablet (500 mg total) by mouth every 6 (six) hours as needed (use for muscle cramps/pain). 20 tablet 1  . OVER THE COUNTER MEDICATION Take 1 each by mouth 2 (two) times daily as needed (anxiety / sleep). CBD Gummies 10 mg    . oxyCODONE (OXY IR/ROXICODONE) 5 MG immediate release tablet Take 1 tablet (5 mg total) by mouth every 6 (six) hours as needed for breakthrough pain. 20 tablet 0   No current facility-administered medications for this visit.    PHYSICAL EXAMINATION: ECOG PERFORMANCE STATUS: 1  Vitals:   09/23/19 0948  BP: 140/85  Pulse: 86  Resp: 20  Temp: 98.1 F (36.7 C)  SpO2: 98%   Filed Weights   09/23/19 0948  Weight: 156 lb 12.8 oz (71.1 kg)    GENERAL:alert, no distress and comfortable SKIN: skin color, texture, turgor are normal, no rashes or significant  lesions EYES: normal, Conjunctiva are pink and non-injected, sclera clear  NECK: supple, thyroid normal size, non-tender, without nodularity LYMPH:  no palpable lymphadenopathy in the cervical, axillary  LUNGS: clear to auscultation and percussion with normal breathing effort HEART: regular rate & rhythm and no murmurs and no lower extremity edema ABDOMEN:abdomen soft, non-tender and normal bowel sounds Musculoskeletal:no cyanosis of digits and no clubbing  NEURO: alert & oriented x 3 with fluent speech, no focal motor/sensory deficits BREAST: S/p b/l mastectomy with breast surgically absent: Surgical incisions healing well (+) Skin ecchymosis of chest wall (+) Mild lymphedema of left chest wall.   LABORATORY DATA:  I have reviewed the data as listed CBC Latest Ref Rng & Units 09/07/2019 09/06/2019 08/28/2019  WBC 4.0 - 10.5 K/uL 8.9 11.2(H) 6.6  Hemoglobin 12.0 - 15.0 g/dL 11.2(L) 11.4(L) 14.4  Hematocrit 36 - 46 % 34.1(L) 34.9(L) 43.6  Platelets 150 - 400 K/uL 240 247 281     CMP Latest Ref Rng & Units 09/06/2019 07/30/2019 09/12/2017  Glucose 70 - 99 mg/dL 109(H) 79 88  BUN 6 - 20 mg/dL <5(L) 6 7  Creatinine 0.44 - 1.00 mg/dL 0.52 0.71 0.66  Sodium 135 - 145 mmol/L 138 137 140  Potassium 3.5 - 5.1 mmol/L 4.0 4.0 4.2  Chloride 98 - 111 mmol/L 106 101 106  CO2 22 - 32 mmol/L '24 26 27  ' Calcium 8.9 - 10.3 mg/dL 9.4 9.8 10.3  Total Protein 6.5 - 8.1 g/dL - 7.4 6.7  Total Bilirubin 0.3 - 1.2 mg/dL - 0.3 0.5  Alkaline Phos 38 - 126 U/L - 112 -  AST 15 - 41 U/L - 24 17  ALT 0 - 44 U/L - 22 11      RADIOGRAPHIC STUDIES: I have personally reviewed the radiological images as listed and agreed with the findings in the report. No results found.   ASSESSMENT & PLAN:  Ryanna Teschner is a 59 y.o. female with    1. Malignant neoplasm of upper-outer quadrant of right breast, invasive ductal carcinoma and DCIS, StageIA,pT1cN0M0, ER+/HER2+, PR-, Grade III  -She was diagnosed in 06/2019. She  was found to have 2 foci in right breast, 1.6cm DCIS in UOQ of and  the 1.3cm invasive ductal carcinoma at 12:00 position. Left breast negative on Breast MRI.  -Given multifocal disease, she underwent right mastectomy and SLNB by Dr Barry Dienes on 09/05/19. I discussed pathology with her which shows multifocal disease in right breast, largest 1.4cm, which were found to be high grade invasive ductal carcinoma with components of DCIS. Her margins were clear and she was node negative. Her left breast tissue was benign.  -Given her HER2 positive high grade disease, adjuvant chemotherapy is recommended to reduce her high risk of recurrence. -Given her small tumor and negative node, I recommend less intensive adjuvant chemotherapy with weekly Taxol and anti-HER2 Herceptin for 12 weeks and continue maintenance Herceptin q3weeks alone to complete 1 year treatment.  She has the option of Herceptin injections after chemo. -She is apprehensive about steroid premeds which is needed for Taxol given its risk of reaction. I will see if her insurance approves Abraxane which does not require steroids.              -Chemotherapy consent: Side effects including but does not limited to, fatigue, nausea, vomiting, diarrhea, hair loss, neuropathy, fluid retention, renal and kidney dysfunction, neutropenic fever, needed for blood transfusion, bleeding, were discussed with patient in great detail. She agreed to proceed.  -the goal of chemo is curative  -I discussed the option of Dignicap to reduce hair loss. She declined. I encouraged her to use perihernial cryotherapy during infusions to reduce her risk of neuropathy. She is interested in neuropathy clinical study.  -Given Herceptin can lead to reversible heart failure, will monitor with cardiologist and repeat echos while on treatment.  -She had PAC placed with surgery. She will proceed with chemo education class today. Plan to start chemo in 2 weeks.  -Given she was node negative,  postmastectomy radiation is not recommended.  -Given the strong ER positive expression, I recommend adjuvant endocrine therapy for a total of 5-10 years to reduce the risk of distant cancer recurrence. She has not had period since uterine ablation. She has had hot flashes for 2-3 years. Will check her Hormonal level and her Bone density before starting as she may be better suited for Tamoxifen. She is interested, plan to start after chemo.  -Given her stage I disease, the risk of further oral anti-HER2 treatment would outweigh the benefit. I do not recommend it.  -Physical exam shows she is healing well. I encouraged her to remain active.  -F/u in with first cycle week of 7/12    2. Genetic Testing  -She has family history of breast, bladder, prostate cancer.  -She does have negative genetic testing:  No pathogenic variants detected on the [facility] Breast Cancer STAT panel + Common Hereditary Cancers panel. A variant of uncertain significance (VUS) was detected in the ATM gene called c.3834C>A. The report date is 08/10/2019. She can repeat testing in 5-10 years.    3. Smoking Cessation, COPD -She has been smoking since the 2nd grade. She has been able to cut down recently. She notes she is willing to further reduce smoking. I discussed smoking risk and how it can effect her healing after surgery. I discussed she may not be offered breast reconstruction unless she quits smoking completely.  -Per pt at some point she may was diagnosed with COPD or Emphysema. She is on albuterol inhaler and  Singulair.  -She has reduced smoking to 1/2ppd. I encouraged her to continue to reduce smoking until she quits completely.   4. Comorbidities: Discoid Lupus, Vitiligo, Thyroid  disease.  -She was diagnosed with Lupus 10 years ago. She uses steroid cream as needed for her lupus rashes, some skin lesions of which have been removed. -She sees her dermatologist Dr Nevada Crane and their [place].  -She is on 2  thyroid medications.  -She notes h/o receeding gums. This has restarted yesterday and causes some pain. I recommend she f/u with her Dentist as chemo increases her risk of infections, including in her mouth. She is agreeable.   5. Social Support, Anxiety  -She lives alone but has good support from her 2 daughters but they live out of town. She notes it is fine to have both her daughter be aware of her medical condition and are the first to contact. She has a boyfriend Harrie Jeans who would be later to contact.  -She is considering using short or long term disability during chemo so she can be off work.  -She notes she is anxious about medical procedures overall.  -Her daughter notes the patient has anxiety or panic attacks when she gets overwhelmed or disappointments.   -Given initial anxiety I previously started her on low dose Xanax PRN (07/30/19). She can take Xanax before infusions. I noted to watch for drowsiness.  -I previously offered her the chance to speak with out SW, she declined.    PLAN:  -I called in EMLA cream, Zofran and Compazine  -Copy note to Research Nurse for Neuropathy clinic study.  -Cardiology referral for echo monitoring when on Herceptin  -Lab, flush, f/u and Taxol and Herceptin weekly X4 starting week of 7/12.    No problem-specific Assessment & Plan notes found for this encounter.   Orders Placed This Encounter  Procedures  . Ambulatory referral to Cardiology    Referral Priority:   Routine    Referral Type:   Consultation    Referral Reason:   Specialty Services Required    Requested Specialty:   Cardiology    Number of Visits Requested:   1   All questions were answered. The patient knows to call the clinic with any problems, questions or concerns. No barriers to learning was detected. The total time spent in the appointment was 45 minutes.     Truitt Merle, MD 09/23/2019   I, Joslyn Devon, am acting as scribe for Truitt Merle, MD.   I have reviewed the above  documentation for accuracy and completeness, and I agree with the above.

## 2019-09-15 ENCOUNTER — Other Ambulatory Visit (HOSPITAL_COMMUNITY): Payer: BC Managed Care – PPO

## 2019-09-23 ENCOUNTER — Encounter: Payer: Self-pay | Admitting: Hematology

## 2019-09-23 ENCOUNTER — Other Ambulatory Visit: Payer: Self-pay

## 2019-09-23 ENCOUNTER — Inpatient Hospital Stay: Payer: BC Managed Care – PPO

## 2019-09-23 ENCOUNTER — Inpatient Hospital Stay: Payer: BC Managed Care – PPO | Attending: Hematology | Admitting: Hematology

## 2019-09-23 VITALS — BP 140/85 | HR 86 | Temp 98.1°F | Resp 20 | Ht 65.0 in | Wt 156.8 lb

## 2019-09-23 DIAGNOSIS — C50411 Malignant neoplasm of upper-outer quadrant of right female breast: Secondary | ICD-10-CM | POA: Diagnosis present

## 2019-09-23 DIAGNOSIS — Z170 Estrogen receptor positive status [ER+]: Secondary | ICD-10-CM

## 2019-09-23 DIAGNOSIS — L80 Vitiligo: Secondary | ICD-10-CM | POA: Diagnosis not present

## 2019-09-23 DIAGNOSIS — F17210 Nicotine dependence, cigarettes, uncomplicated: Secondary | ICD-10-CM | POA: Diagnosis not present

## 2019-09-23 DIAGNOSIS — L930 Discoid lupus erythematosus: Secondary | ICD-10-CM | POA: Diagnosis not present

## 2019-09-23 DIAGNOSIS — E039 Hypothyroidism, unspecified: Secondary | ICD-10-CM | POA: Diagnosis not present

## 2019-09-23 DIAGNOSIS — F419 Anxiety disorder, unspecified: Secondary | ICD-10-CM | POA: Insufficient documentation

## 2019-09-24 ENCOUNTER — Telehealth: Payer: Self-pay | Admitting: Hematology

## 2019-09-24 ENCOUNTER — Other Ambulatory Visit: Payer: Self-pay

## 2019-09-24 DIAGNOSIS — Z170 Estrogen receptor positive status [ER+]: Secondary | ICD-10-CM

## 2019-09-24 DIAGNOSIS — C50411 Malignant neoplasm of upper-outer quadrant of right female breast: Secondary | ICD-10-CM

## 2019-09-24 DIAGNOSIS — R110 Nausea: Secondary | ICD-10-CM

## 2019-09-24 DIAGNOSIS — Z95828 Presence of other vascular implants and grafts: Secondary | ICD-10-CM

## 2019-09-24 MED ORDER — LIDOCAINE-PRILOCAINE 2.5-2.5 % EX CREA: 1.0000 "application " | TOPICAL_CREAM | CUTANEOUS | 1 refills | Status: DC | PRN

## 2019-09-24 MED ORDER — ONDANSETRON HCL 8 MG PO TABS: 8.0000 mg | ORAL_TABLET | Freq: Three times a day (TID) | ORAL | 1 refills | Status: DC | PRN

## 2019-09-24 MED ORDER — PROCHLORPERAZINE MALEATE 10 MG PO TABS: 10.0000 mg | ORAL_TABLET | Freq: Four times a day (QID) | ORAL | 1 refills | Status: DC | PRN

## 2019-09-24 NOTE — Telephone Encounter (Signed)
Scheduled per 6/29 los. Pt is aware of appt times and dates.

## 2019-09-24 NOTE — Telephone Encounter (Signed)
Scheduled per 6/29 los. Pt is aware of appts added.

## 2019-09-25 ENCOUNTER — Ambulatory Visit: Payer: BC Managed Care – PPO | Attending: General Surgery

## 2019-09-25 ENCOUNTER — Other Ambulatory Visit: Payer: Self-pay

## 2019-09-25 DIAGNOSIS — M25512 Pain in left shoulder: Secondary | ICD-10-CM

## 2019-09-25 DIAGNOSIS — M25511 Pain in right shoulder: Secondary | ICD-10-CM | POA: Diagnosis present

## 2019-09-25 DIAGNOSIS — R293 Abnormal posture: Secondary | ICD-10-CM | POA: Insufficient documentation

## 2019-09-25 DIAGNOSIS — M25612 Stiffness of left shoulder, not elsewhere classified: Secondary | ICD-10-CM | POA: Insufficient documentation

## 2019-09-25 DIAGNOSIS — C50411 Malignant neoplasm of upper-outer quadrant of right female breast: Secondary | ICD-10-CM | POA: Diagnosis present

## 2019-09-25 DIAGNOSIS — M25611 Stiffness of right shoulder, not elsewhere classified: Secondary | ICD-10-CM | POA: Diagnosis present

## 2019-09-25 DIAGNOSIS — R0789 Other chest pain: Secondary | ICD-10-CM | POA: Insufficient documentation

## 2019-09-25 DIAGNOSIS — Z170 Estrogen receptor positive status [ER+]: Secondary | ICD-10-CM | POA: Insufficient documentation

## 2019-09-25 NOTE — Patient Instructions (Signed)
Access Code: [facility] URL: [email or web address] Date: 09/25/2019 Prepared by: Tomma Rakers  Exercises Shoulder External Rotation and Scapular Retraction - 3 x daily - 7 x weekly - 1 sets - 10 reps Standing Backward Shoulder Rolls - 3 x daily - 7 x weekly - 1 sets - 10 reps Seated Shoulder Flexion Towel Slide at Table Top - 3 x daily - 7 x weekly - 1 sets - 10 reps Seated Shoulder Abduction Towel Slide at Table Top - 3 x daily - 7 x weekly - 1 sets - 10 reps

## 2019-09-25 NOTE — Therapy (Signed)
[place], [place], [address] Phone: [phone]   Fax:  [phone]  Physical Therapy Re-Evaluation  Patient Details  Name: Stephanie Chase MRN: [id number] Date of Birth: 1960/08/15 Referring Provider (PT): Dr. Stark Klein   Encounter Date: 09/25/2019   PT End of Session - 09/25/19 1156    Visit Number 2    Number of Visits 10    Date for PT Re-Evaluation 10/30/19    PT Start Time 1105    PT Stop Time 1155    PT Time Calculation (min) 50 min    Activity Tolerance Patient tolerated treatment well    Behavior During Therapy [facility] for tasks assessed/performed           Past Medical History:  Diagnosis Date  . Anxiety   . Asthma   . COPD (chronic obstructive pulmonary disease) ([place])   . Family history of bladder cancer   . Family history of BRCA gene mutation   . Family history of breast cancer   . Family history of prostate cancer   . Hypothyroidism   . Lupus ([place])   . Thyroid disease     Past Surgical History:  Procedure Laterality Date  . BREAST LUMPECTOMY    . MASTECTOMY W/ SENTINEL NODE BIOPSY Bilateral 09/05/2019   Procedure: BILATERAL MASTECTOMY WITH RIGHT SENTINEL LYMPH NODE BIOPSY;  Surgeon: Stark Klein, MD;  Location: [facility];  Service: General;  Laterality: Bilateral;  COMBINED WITH REGIONAL FOR POST OP PAIN  . PORTACATH PLACEMENT Left 09/05/2019   Procedure: INSERTION PORT-A-CATH WITH ULTRASOUND GUIDANCE;  Surgeon: Stark Klein, MD;  Location: [facility];  Service: General;  Laterality: Left;  . TONSILLECTOMY    . WISDOM TOOTH EXTRACTION      There were no vitals filed for this visit.   Subjective Assessment - 09/25/19 1110    Subjective Pt states that she thinks things have been going well since surgery. She believes that she was told not to lift her arms over her head so she has not been lifting her arms over her head so she has alot of soreness especially over her sternum and has  a lot of tightness.    Patient is accompained by: Family member    Pertinent History Bil masectomy 09/05/19 with 3 lymph nodes removed and negative on the R and benign tisuse on the L. Patient was diagnosed on 06/09/2019 with right grade III invasive ductal carcinoma breast cancer. It mesures 1.3 cm in the upper outer quadrant with 1.6 cm of calcifications and DCIS. It is ER positive, PR negative, and HER2 positive with a Ki67 of 50%. She smokes about 1 pack/day.    Patient Stated Goals I want to get back to kayaking.    Currently in Pain? Yes    Pain Score 2     Pain Location Chest    Pain Orientation Mid    Pain Descriptors / Indicators Tender;Sore    Pain Type Surgical pain;Acute pain    Pain Onset 1 to 4 weeks ago    Pain Frequency Constant    Aggravating Factors  unknown    Pain Relieving Factors unknown    Effect of Pain on Daily Activities Pain does not necessary effect daily activities ROM is more hindering.              [facility] PT Assessment - 09/25/19 0001      Assessment   Medical Diagnosis Right breast cancer    Referring Provider (  PT) Dr. Stark Klein    Onset Date/Surgical Date 09/05/19    Hand Dominance Right    Prior Therapy none      Precautions   Precautions Other (comment)    Precaution Comments breast cancer, bil masectomy       Restrictions   Weight Bearing Restrictions No      Balance Screen   Has the patient fallen in the past 6 months No    Has the patient had a decrease in activity level because of a fear of falling?  Yes    Is the patient reluctant to leave their home because of a fear of falling?  No      Home Ecologist residence    Living Arrangements Alone      Prior Function   Level of Independence Independent with basic ADLs    Vocation Full time employment    Vocation Requirements [employer] planning  10/07/2019    Leisure Kayaking, paddle boarding hunting      Cognition   Overall Cognitive Status Within  Functional Limits for tasks assessed      Posture/Postural Control   Posture/Postural Control Postural limitations    Postural Limitations Forward head;Rounded Shoulders      ROM / Strength   AROM / PROM / Strength AROM      AROM   AROM Assessment Site Shoulder    Right/Left Shoulder Right;Left    Right Shoulder Flexion 110 Degrees    Right Shoulder ABduction 69 Degrees    Right Shoulder Internal Rotation --   not taken due to abduction limitations.    Right Shoulder External Rotation --   Not taken due to abduction limitations.    Left Shoulder Flexion 118 Degrees    Left Shoulder ABduction 77 Degrees    Left Shoulder Internal Rotation --   not taken due to abduction limitations.    Left Shoulder External Rotation --   not taken due to abduction limitations.             LYMPHEDEMA/ONCOLOGY QUESTIONNAIRE - 09/25/19 0001      Type   Cancer Type Right breast cancer      Surgeries   Mastectomy Date 09/05/19    Sentinel Lymph Node Biopsy Date 09/05/19    Number Lymph Nodes Removed 3      Treatment   Active Chemotherapy Treatment No   starting chemotherapy in 2 weeks.    Past Chemotherapy Treatment No    Active Radiation Treatment No    Past Radiation Treatment No    Current Hormone Treatment No    Past Hormone Therapy No      What other symptoms do you have   Are you Having Heaviness or Tightness Yes    Are you having Pain Yes    Are you having pitting edema No    Is it Hard or Difficult finding clothes that fit No    Do you have infections No    Is there Decreased scar mobility Yes      Lymphedema Assessments   Lymphedema Assessments Upper extremities      Right Upper Extremity Lymphedema   10 cm Proximal to Olecranon Process 28.7 cm    Olecranon Process 24.2 cm    10 cm Proximal to Ulnar Styloid Process 23.4 cm    Just Proximal to Ulnar Styloid Process 15.8 cm    Across Hand at [employer] 19.5 cm    At [employer]  of 2nd Digit 6.5 cm      Left Upper  Extremity Lymphedema   10 cm Proximal to Olecranon Process 28 cm    Olecranon Process 24.6 cm    10 cm Proximal to Ulnar Styloid Process 22.2 cm    Just Proximal to Ulnar Styloid Process 15.2 cm    Across Hand at [employer] 18 cm    At [place] of 2nd Digit 6.2 cm                      [facility] Adult PT Treatment/Exercise - 09/25/19 0001      Exercises   Exercises Shoulder      Shoulder Exercises: Seated   External Rotation Both;10 reps    External Rotation Limitations bil ER with scapular squeeze with elbows at 90 degrees using demonstration and VC.     Flexion Both;10 reps    Flexion Limitations table slide sitting 10x 5 second hold VC to avoid pain, scapular stretching is okay in the back     Abduction Right;Left;AAROM;10 reps    ABduction Limitations demonstratoin and VC to prevent compensation with VC for easy stretching to avoid pain.     Other Seated Exercises Posterior shoulder rolls 10x with demonstration and return demonstration.                   PT Education - 09/25/19 1154    Education Details Access Code: [facility], Pt was educated on precautions post surgery. DIscussed healing time within physical therapy scope of practice. Discussed return to activities including slow return listening to her pain and avoiding tension on incisions at this time.    Person(s) Educated Patient;Other (comment)   Niece.   Methods Explanation;Demonstration;Verbal cues;Handout    Comprehension Verbalized understanding;Returned demonstration            PT Short Term Goals - 09/25/19 1202      PT SHORT TERM GOAL #1   Title Pt will be independent with HEP and Modified MLD for the anterior trunk within 2 weeks in order to demonstrate autonomy of care.    Baseline pt provided with HEP today    Time 2    Period Weeks    Status New    Target Date 10/16/19             PT Long Term Goals - 09/25/19 1203      PT LONG TERM GOAL #1   Title Pt will demonstrate 150  degrees bil shoulder flexion and abduction within 4 weeks to demonstrate return to pre-operative AROM.    Baseline R shoulder flexion: 110, abduction: 69 L shoulder flexion: 118, abduction:  77    Time 4    Period Weeks    Status New    Target Date 10/30/19      PT LONG TERM GOAL #2   Title Pt will report 50% improvement in functional mobility and pain in order to demonstrate an improvement in subjective quality of life.    Baseline 2/10 pain, decresaed ROM below shoulder height. MOdifications with bathing/dressing    Time 4    Period Weeks    Status New    Target Date 10/30/19                 Plan - 09/25/19 1157    Clinical Impression Statement Pt returns post bil masectomy on 09/05/19 with 3 lymph node removal on the R. She has not yet started chemotherapy but is  starting in 2 weeks. She is also returning to work around that time. Pt has not been moving her arms above shoulder height at this time due to she was told not to do this after surgery. Pt reports that she has minimal pain she mostly has tightness with lifting her arms and sorenss in her anterior chest wall. Pt demonstrates no increased in circumferential measurements of the bil UE; L-Dex was not performed today due to pt is not yet one month out of surgery. Pt does demonstrate significant decrease in bil shoulder ROM from pre-operative measurements with what appears to be post-operative swelling in the bil axilla and anterior chest wall. Pt will benefit from skilled physical therapy services 1-2x/week for 4 weeks depending on pt schedule due to return to work.    Personal Factors and Comorbidities Comorbidity 1    Comorbidities bil masectomy with 3 lymph node removal on the R    Stability/Clinical Decision Making Stable/Uncomplicated    Clinical Decision Making Low    Rehab Potential Good    PT Frequency 2x / week    PT Duration 4 weeks    PT Treatment/Interventions Therapeutic activities;Therapeutic  exercise;Neuromuscular re-education;Manual techniques;Cryotherapy;Moist Heat    PT Next Visit Plan Do L-Dex, assess HEP, re-measure ROM, determine if pt needs to continue with physical therapy, increased AA/ROM and A/ROM activities.    PT Home Exercise Plan Access Code: [facility]    Consulted and Agree with Plan of Care Patient;Family member/caregiver           Patient will benefit from skilled therapeutic intervention in order to improve the following deficits and impairments:  Decreased knowledge of precautions, Pain, Decreased range of motion, Increased edema  Visit Diagnosis: Malignant neoplasm of upper-outer quadrant of right breast in female, estrogen receptor positive ([place])  Stiffness of left shoulder, not elsewhere classified  Stiffness of right shoulder, not elsewhere classified  Acute pain of right shoulder  Acute pain of left shoulder  Sternal pain     Problem List Patient Active Problem List   Diagnosis Date Noted  . Breast cancer of upper-outer quadrant of right female breast ([place]) 09/05/2019  . Genetic testing 08/12/2019  . Family history of BRCA gene mutation 07/31/2019  . Family history of breast cancer   . Family history of prostate cancer   . Family history of bladder cancer   . Malignant neoplasm of upper-outer quadrant of right breast in female, estrogen receptor positive ([place]) 07/24/2019  . Annual physical exam 03/31/2016  . Occupational bronchitis ([place]) 03/03/2015  . Hyperlipidemia 03/30/2014  . 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 Generalized osteoarthritis 02/23/2012  . Plantar fasciitis, bilateral 02/23/2012  . Vitiligo 02/23/2012  . Difficulty hearing 02/13/2012  . History of lupus 12/21/2011  . Vitamin D deficiency 10/11/2010  . Hypothyroidism 06/21/2010  . Systemic lupus erythematosus ([place]) 06/14/2010    Ander Purpura, PT 09/25/2019, 12:07 PM  [place],  [place], [address] Phone: [phone]   Fax:  [phone]  Name: Airabella Barley MRN: [id number] Date of Birth: [birth year]

## 2019-09-26 ENCOUNTER — Encounter: Payer: Self-pay | Admitting: Radiology

## 2019-09-26 ENCOUNTER — Encounter: Payer: Self-pay | Admitting: Hematology

## 2019-09-26 DIAGNOSIS — C50411 Malignant neoplasm of upper-outer quadrant of right female breast: Secondary | ICD-10-CM

## 2019-09-26 DIAGNOSIS — Z170 Estrogen receptor positive status [ER+]: Secondary | ICD-10-CM

## 2019-09-26 MED ORDER — LIDOCAINE-PRILOCAINE 2.5-2.5 % EX CREA: TOPICAL_CREAM | CUTANEOUS | 3 refills | Status: DC

## 2019-09-26 MED ORDER — ONDANSETRON HCL 8 MG PO TABS: 8.0000 mg | ORAL_TABLET | Freq: Two times a day (BID) | ORAL | 1 refills | Status: DC | PRN

## 2019-09-26 MED ORDER — PROCHLORPERAZINE MALEATE 10 MG PO TABS: 10.0000 mg | ORAL_TABLET | Freq: Four times a day (QID) | ORAL | 1 refills | Status: DC | PRN

## 2019-09-26 NOTE — Research (Signed)
[id number], A PROSPECTIVE OBSERVATIONAL COHORT STUDY TO DEVELOP A PREDICTIVE MODEL OFTAXANE-INDUCED PERIPHERAL NEUROPATHY IN CANCER PATIENTS.  09/26/2019 12:45PM  PHONE CALL: Confirmed I was speaking with Stephanie Chase. Verified Dr. Burr Medico had spoken with her about the study and gave her further information concerning the purpose of the study, what the study entailed, and the voluntary nature of the study as well. [name] expressed interest in learning more about the study, so a copy of the consent form (Protocol version date: 08/07/19, [place] activation date: 09/01/19) was e-mailed to [email or web address]. Will follow-up with patient on Tuesday (7/6) to see if she has had a chance to review and if she has any questions or has interest in participating. If she is interested in participating, the plan is to have a consent visit one day this week for her to sign consent and fill out PROs prior to her first treatment on 10/06/19. Thanked [place] for her time.   Thomes Burak RT (R)(T) Clinical Research Coordinator 09/26/19

## 2019-09-26 NOTE — Progress Notes (Signed)
START ON PATHWAY REGIMEN - Breast     Cycle 1: A cycle is 7 days:     Trastuzumab-xxxx      Paclitaxel    Cycles 2 through 12: A cycle is every 7 days:     Trastuzumab-xxxx      Paclitaxel    Cycles 13 through 25: A cycle is every 21 days:     Trastuzumab-xxxx   **Always confirm dose/schedule in your pharmacy ordering system**  Patient Characteristics: Postoperative without Neoadjuvant Therapy (Pathologic Staging), Invasive Disease, Adjuvant Therapy, HER2 Positive, ER Positive, Node Negative, pT1c, pN0/N27m Therapeutic Status: Postoperative without Neoadjuvant Therapy (Pathologic Staging) AJCC Grade: G3 AJCC N Category: pN0 AJCC M Category: cM0 ER Status: Positive (+) AJCC 8 Stage Grouping: IA HER2 Status: Positive (+) Oncotype Dx Recurrence Score: Not Appropriate AJCC T Category: pT1c PR Status: Negative (-) Intent of Therapy: Curative Intent, Discussed with Patient

## 2019-09-29 ENCOUNTER — Other Ambulatory Visit: Payer: Self-pay

## 2019-09-29 ENCOUNTER — Emergency Department (HOSPITAL_COMMUNITY)
Admission: EM | Admit: 2019-09-29 | Discharge: 2019-09-29 | Disposition: A | Discharge status: Home / Self Care | Payer: BC Managed Care – PPO | Attending: Emergency Medicine | Admitting: Emergency Medicine

## 2019-09-29 ENCOUNTER — Encounter (HOSPITAL_COMMUNITY): Payer: Self-pay | Admitting: Emergency Medicine

## 2019-09-29 DIAGNOSIS — F17210 Nicotine dependence, cigarettes, uncomplicated: Secondary | ICD-10-CM | POA: Insufficient documentation

## 2019-09-29 DIAGNOSIS — Z5189 Encounter for other specified aftercare: Secondary | ICD-10-CM

## 2019-09-29 DIAGNOSIS — T8149XA Infection following a procedure, other surgical site, initial encounter: Secondary | ICD-10-CM | POA: Diagnosis not present

## 2019-09-29 DIAGNOSIS — E039 Hypothyroidism, unspecified: Secondary | ICD-10-CM | POA: Insufficient documentation

## 2019-09-29 DIAGNOSIS — J45909 Unspecified asthma, uncomplicated: Secondary | ICD-10-CM | POA: Diagnosis not present

## 2019-09-29 DIAGNOSIS — Z9013 Acquired absence of bilateral breasts and nipples: Secondary | ICD-10-CM | POA: Diagnosis not present

## 2019-09-29 DIAGNOSIS — J449 Chronic obstructive pulmonary disease, unspecified: Secondary | ICD-10-CM | POA: Diagnosis not present

## 2019-09-29 NOTE — ED Provider Notes (Signed)
[facility] DEPT Provider Note   CSN: [id number] Arrival date & time: 09/29/19  1421     History Chief Complaint  Patient presents with  . Post-op Problem    Stephanie Chase is a 59 y.o. female.  Stephanie Chase is a 59 y.o. female with history of recently diagnosed breast cancer s/p bilateral mastectomy, COPD, asthma, hypothyroidism, lupus, who presents to the emergency department with clinic for wound infection.  Patient had bilateral mastectomy as well and Port-A-Cath placement on 6/11 with Dr. Barry Dienes.  Overall patient has been consistently improving since surgery but over the past few days she started to notice a small area of yellowing over her right breast incision and became concerned for infection.  She called the on-call number for the office and was told to go to urgent care, and they started her on Bactrim.  She states at that time there was just some yellow areas in the incision but drainage.  She reports normal over the past few days if anything noticed a very small amount of drainage on the dressing when she changes it but has not had any large-volume drainage.  She states that she thinks it is a little red and tender but redness or drainage at this area. No significant erythema surrounding the incision, no similar areas on the left breast incision.  No fevers or chills.  She states she has continued to have some tenderness over the Port-A-Cath site. She states that she took the Bactrim for 2 days but then started to experience some itching and a red rash that she noted over her upper chest, back and arms, stopped taking it today and redness and rash seem to have improved, she did not have her previously known sulfa allergy but thinks she is having allergic reaction to this medication.  Has not been seen by her surgeon since she started to notice changes to her incision.  Patient is followed by Dr. Follow with oncology and is scheduled to start chemotherapy  on 7/12.        Past Medical History:  Diagnosis Date  . Anxiety   . Asthma   . COPD (chronic obstructive pulmonary disease) ([place])   . Family history of bladder cancer   . Family history of BRCA gene mutation   . Family history of breast cancer   . Family history of prostate cancer   . Hypothyroidism   . Lupus ([place])   . Thyroid disease     Patient Active Problem List   Diagnosis Date Noted  . Breast cancer of upper-outer quadrant of right female breast ([place]) 09/05/2019  . Genetic testing 08/12/2019  . Family history of BRCA gene mutation 07/31/2019  . Family history of breast cancer   . Family history of prostate cancer   . Family history of bladder cancer   . Malignant neoplasm of upper-outer quadrant of right breast in female, estrogen receptor positive ([place]) 07/24/2019  . Annual physical exam 03/31/2016  . Occupational bronchitis ([place]) 03/03/2015  . Hyperlipidemia 03/30/2014  . 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 Generalized osteoarthritis 02/23/2012  . Plantar fasciitis, bilateral 02/23/2012  . Vitiligo 02/23/2012  . Difficulty hearing 02/13/2012  . History of lupus 12/21/2011  . Vitamin D deficiency 10/11/2010  . Hypothyroidism 06/21/2010  . Systemic lupus erythematosus ([place]) 06/14/2010    Past Surgical History:  Procedure Laterality Date  . BREAST LUMPECTOMY    . MASTECTOMY W/ SENTINEL NODE BIOPSY Bilateral 09/05/2019   Procedure: BILATERAL MASTECTOMY WITH RIGHT  SENTINEL LYMPH NODE BIOPSY;  Surgeon: Stark Klein, MD;  Location: [facility];  Service: General;  Laterality: Bilateral;  COMBINED WITH REGIONAL FOR POST OP PAIN  . PORTACATH PLACEMENT Left 09/05/2019   Procedure: INSERTION PORT-A-CATH WITH ULTRASOUND GUIDANCE;  Surgeon: Stark Klein, MD;  Location: [facility];  Service: General;  Laterality: Left;  . TONSILLECTOMY    . WISDOM TOOTH EXTRACTION       OB History    Gravida  2   Para  2   Term      Preterm      AB      Living        SAB      TAB       Ectopic      Multiple      Live Births              Family History  Problem Relation Age of Onset  . Breast cancer Mother 57  . Diabetes Mother   . Bladder Cancer Mother 29       ureteral cancer  . Cancer Mother        blood cancer  . Heart attack Father   . Hyperlipidemia Father   . Hypertension Father   . Alzheimer's disease Father   . Prostate cancer Father        dx. >50  . Alzheimer's disease Other   . Cancer Maternal Uncle        prostate or colon  . BRCA 1/2 Daughter   . Breast cancer Other 45       maternal great-aunt  . Cancer Cousin        unknown type; dx. in her 77s (maternal cousin)    Social History   Tobacco Use  . Smoking status: Current Every Day Smoker    Packs/day: 1.00    Years: 50.00    Pack years: 50.00    Types: Cigarettes  . Smokeless tobacco: Never Used  Vaping Use  . Vaping Use: Never used  Substance Use Topics  . Alcohol use: Yes    Comment: once a month  . Drug use: No    Home Medications Prior to Admission medications   Medication Sig Start Date End Date Taking? Authorizing Provider  acetaminophen (TYLENOL) 500 MG tablet Take 1,000 mg by mouth every 6 (six) hours as needed for moderate pain.    [provider]  Albuterol Sulfate (PROAIR RESPICLICK) 379 (90 Base) MCG/ACT AEPB Inhale 2 puffs into the lungs every 6 (six) hours as needed. Patient taking differently: Inhale 2 puffs into the lungs every 6 (six) hours as needed (SOB).  12/20/16   Silverio Decamp, MD  ALPRAZolam Duanne Moron) 0.25 MG tablet Take 1 tablet (0.25 mg total) by mouth 2 (two) times daily as needed for anxiety. 07/30/19   Truitt Merle, MD  Ascorbic Acid (VITAMIN C) 1000 MG tablet Take 1,000 mg by mouth daily.    [provider]  cetirizine (ZYRTEC) 10 MG tablet Take 10 mg by mouth daily as needed for allergies.  04/29/19   [provider]  cholecalciferol (VITAMIN D3) 25 MCG (1000 UNIT) tablet Take 1,000 Units by mouth daily.    [provider]  dextromethorphan (DELSYM) 30 MG/5ML liquid Take 60 mg by mouth 2 (two) times daily as needed for cough.    [provider]  fluticasone (FLONASE) 50 MCG/ACT nasal spray Place 1 spray into both nostrils daily as needed for allergies.  [provider]  levothyroxine (SYNTHROID) 75 MCG tablet Take 75 mcg by mouth daily before breakfast.    [provider]  lidocaine-prilocaine (EMLA) cream Apply 1 application topically as needed. 09/24/19   Truitt Merle, MD  lidocaine-prilocaine (EMLA) cream Apply to affected area once 09/26/19   Truitt Merle, MD  liothyronine (CYTOMEL) 5 MCG tablet TAKE 2 TABLETS (=10MCG     TOTAL)     DAILY Patient taking differently: Take 10 mcg by mouth daily. TAKE 2 TABLETS (=10MCG     TOTAL)     DAILY 03/06/18   Silverio Decamp, MD  methocarbamol (ROBAXIN) 500 MG tablet Take 1 tablet (500 mg total) by mouth every 6 (six) hours as needed (use for muscle cramps/pain). 09/07/19   Maczis, Barth Kirks, PA-C  ondansetron (ZOFRAN) 8 MG tablet Take 1 tablet (8 mg total) by mouth every 8 (eight) hours as needed for nausea or vomiting. 09/24/19   Truitt Merle, MD  ondansetron (ZOFRAN) 8 MG tablet Take 1 tablet (8 mg total) by mouth 2 (two) times daily as needed (Nausea or vomiting). 09/26/19   Truitt Merle, MD  OVER THE COUNTER MEDICATION Take 1 each by mouth 2 (two) times daily as needed (anxiety / sleep). CBD Gummies 10 mg    [provider]  oxyCODONE (OXY IR/ROXICODONE) 5 MG immediate release tablet Take 1 tablet (5 mg total) by mouth every 6 (six) hours as needed for breakthrough pain. 09/07/19   Maczis, Barth Kirks, PA-C  prochlorperazine (COMPAZINE) 10 MG tablet Take 1 tablet (10 mg total) by mouth every 6 (six) hours as needed for nausea or vomiting. 09/24/19   Truitt Merle, MD  prochlorperazine (COMPAZINE) 10 MG tablet Take 1 tablet (10 mg total) by mouth every 6 (six) hours as needed (Nausea or vomiting). 09/26/19   Truitt Merle, MD    Allergies      Patient has no known allergies.  Review of Systems   Review of Systems  Constitutional: Negative for chills and fever.  Skin: Positive for color change and wound.  All other systems reviewed and are negative.   Physical Exam Updated Vital Signs BP 133/88 (BP Location: Left Arm)   Pulse 72   Temp 98.6 F (37 C) (Oral)   Resp 18   SpO2 99%   Physical Exam Vitals and nursing note reviewed.  Constitutional:      General: She is not in acute distress.    Appearance: She is well-developed. She is not diaphoretic.  HENT:     Head: Normocephalic and atraumatic.  Eyes:     General:        Right eye: No discharge.        Left eye: No discharge.  Cardiovascular:     Rate and Rhythm: Normal rate and regular rhythm.     Heart sounds: Normal heart sounds.  Pulmonary:     Effort: Pulmonary effort is normal. No respiratory distress.     Breath sounds: Normal breath sounds.  Chest:     Comments: Bilateral mastectomy incisions noted, there is a small area of yellowed tissue noted to the medial aspect of the right incision, but no palpable fluctuance or underlying fluid collection and no expressible drainage.  Dermabond is still in place.  No significant surrounding erythema.  Left incision is unremarkable.  Port-A-Cath in place without overlying erythema or warmth.  Mildly tender. (See photo below) Musculoskeletal:     Cervical back: Neck supple.  Neurological:     Mental  Status: She is alert.     Coordination: Coordination normal.  Psychiatric:        Behavior: Behavior normal.       ED Results / Procedures / Treatments   Labs (all labs ordered are listed, but only abnormal results are displayed) Labs Reviewed - No data to display  EKG None  Radiology No results found.  Procedures Procedures (including critical care time)  Medications Ordered in ED Medications - No data to display  ED Course  I have reviewed the triage vital signs and the nursing  notes.  Pertinent labs & imaging results that were available during my care of the patient were reviewed by me and considered in my medical decision making (see chart for details).    MDM Rules/Calculators/A&P                          Case discussed with Dr. Marcello Moores with general surgery who reviewed photos of patient's wound, feels this is likely some small areas of tissue ischemia at the edges of the wound, but it does not appear to be infected, she is reassured that there is not a palpable underlying fluid collection or any expressible drainage and that there is not surrounding erythema.  Given the patient has not yet on chemotherapy she does not think the patient needs additional antibiotics at this time, will stop Bactrim due to mild allergic reaction.  Recommends washing wound regularly with water and soap and frequent dressing changes.  Recommends having the patient follow-up closely with Dr. Barry Dienes in the office before starting chemotherapy.  I discussed this plan with the patient who expresses understanding and agreement with this plan.  Discharged home in good condition, will have her call Dr. Marlowe Aschoff office tomorrow to schedule close follow-up before she begins chemo on 7/12.  Strict return precautions discussed.   Final Clinical Impression(s) / ED Diagnoses Final diagnoses:  Visit for wound check    Rx / DC Orders ED Discharge Orders    None       Janet Berlin 10/01/19 Arnoldo Hooker, MD 10/04/19 [phone]

## 2019-09-29 NOTE — ED Triage Notes (Signed)
Pt had double mastectomy and port a cath placed on June 11. Reports her incision on right breast is infected with drainage. Was started on oral antibiotics from UC on Saturday. Also reports that her port site looks red to patient and soreness.

## 2019-09-29 NOTE — Discharge Instructions (Addendum)
Please call to schedule a follow-up appointment with Dr. Barry Dienes prior to starting your chemo to have your wound reassessed.  Wash with gentle soap and warm water at least twice a day and change dressing at least twice a day.  I discussed case with Dr. Marcello Moores with general surgery who felt this was likely a small area of tissue ischemia where the incision is healing.  She did not feel that he needed further antibiotics, please stop Bactrim I have added this to your allergy list.  If you develop swelling, tenderness, or surrounding redness call Dr. Marlowe Aschoff office or return to the ED.

## 2019-09-30 ENCOUNTER — Encounter: Payer: Self-pay | Admitting: Radiology

## 2019-09-30 ENCOUNTER — Telehealth: Payer: Self-pay

## 2019-09-30 DIAGNOSIS — C50411 Malignant neoplasm of upper-outer quadrant of right female breast: Secondary | ICD-10-CM

## 2019-09-30 DIAGNOSIS — Z170 Estrogen receptor positive status [ER+]: Secondary | ICD-10-CM

## 2019-09-30 NOTE — Telephone Encounter (Signed)
I spoke with Stephanie Chase regarding ED visit for possible necrotic tissue at surgical site.  She is waiting for a call back from Dr. Marlowe Aschoff office for appt.  I instructed her to call mt tomorrow and let me know when the appt is scheduled. She verbalized understanding.

## 2019-10-01 ENCOUNTER — Encounter: Payer: Self-pay | Admitting: Radiology

## 2019-10-01 ENCOUNTER — Other Ambulatory Visit: Payer: Self-pay

## 2019-10-01 DIAGNOSIS — C50411 Malignant neoplasm of upper-outer quadrant of right female breast: Secondary | ICD-10-CM

## 2019-10-01 DIAGNOSIS — Z170 Estrogen receptor positive status [ER+]: Secondary | ICD-10-CM

## 2019-10-01 NOTE — Research (Signed)
K8206, A PROSPECTIVE OBSERVATIONAL COHORT STUDY TO DEVELOP A PREDICTIVE MODEL OFTAXANE-INDUCED PERIPHERAL NEUROPATHY IN CANCER PATIENTS.  10/01/2019 11:00AM  PHONE CALL: Spoke with [place] concerning [phone] study and patient expressed interest in participation. We plan on meeting tomorrow morning at [place] after her physical therapy appointment to sign consent and fill out PROs in order to register patient. I thanked her for her time and participation in this study and I look forward to meeting with her tomorrow morning.   [place] RT(R)(T) Scientist, physiological

## 2019-10-01 NOTE — Research (Signed)
F1219, A PROSPECTIVE OBSERVATIONAL COHORT STUDY TO DEVELOP A PREDICTIVE MODEL OFTAXANE-INDUCED PERIPHERAL NEUROPATHY IN CANCER PATIENTS.  09/30/2019 3:15PM  PHONE CALL: Spoke with Stephanie Chase concerning possible interest in [id number] study. She stated she had not had a chance to fully review the consent and HIPPA forms and was concerned about the privacy aspect of the study. I explained how her information is kept private throughout the study and let her know I would follow-up tomorrow so she has time to further review. I thanked her for her time and interest and will follow-up tomorrow.   [place] RT(R)(T) Scientist, physiological

## 2019-10-02 ENCOUNTER — Telehealth: Payer: Self-pay | Admitting: *Deleted

## 2019-10-02 ENCOUNTER — Other Ambulatory Visit: Payer: Self-pay

## 2019-10-02 ENCOUNTER — Ambulatory Visit: Payer: BC Managed Care – PPO

## 2019-10-02 ENCOUNTER — Inpatient Hospital Stay: Payer: BC Managed Care – PPO | Attending: Hematology | Admitting: *Deleted

## 2019-10-02 ENCOUNTER — Encounter: Payer: Self-pay | Admitting: Nurse Practitioner

## 2019-10-02 ENCOUNTER — Encounter: Payer: Self-pay | Admitting: *Deleted

## 2019-10-02 ENCOUNTER — Inpatient Hospital Stay: Payer: BC Managed Care – PPO

## 2019-10-02 ENCOUNTER — Ambulatory Visit: Payer: BC Managed Care – PPO | Admitting: Physical Therapy

## 2019-10-02 ENCOUNTER — Inpatient Hospital Stay (HOSPITAL_BASED_OUTPATIENT_CLINIC_OR_DEPARTMENT_OTHER): Payer: BC Managed Care – PPO | Admitting: Nurse Practitioner

## 2019-10-02 ENCOUNTER — Encounter: Payer: Self-pay | Admitting: Radiology

## 2019-10-02 VITALS — BP 126/91 | HR 65 | Temp 97.2°F | Resp 17 | Ht 65.0 in | Wt 157.8 lb

## 2019-10-02 DIAGNOSIS — C50411 Malignant neoplasm of upper-outer quadrant of right female breast: Secondary | ICD-10-CM | POA: Diagnosis not present

## 2019-10-02 DIAGNOSIS — Z170 Estrogen receptor positive status [ER+]: Secondary | ICD-10-CM

## 2019-10-02 DIAGNOSIS — F419 Anxiety disorder, unspecified: Secondary | ICD-10-CM | POA: Diagnosis not present

## 2019-10-02 DIAGNOSIS — Z8052 Family history of malignant neoplasm of bladder: Secondary | ICD-10-CM | POA: Insufficient documentation

## 2019-10-02 DIAGNOSIS — L80 Vitiligo: Secondary | ICD-10-CM | POA: Diagnosis not present

## 2019-10-02 DIAGNOSIS — Z881 Allergy status to other antibiotic agents status: Secondary | ICD-10-CM | POA: Diagnosis not present

## 2019-10-02 DIAGNOSIS — F17210 Nicotine dependence, cigarettes, uncomplicated: Secondary | ICD-10-CM | POA: Insufficient documentation

## 2019-10-02 DIAGNOSIS — R21 Rash and other nonspecific skin eruption: Secondary | ICD-10-CM | POA: Insufficient documentation

## 2019-10-02 DIAGNOSIS — L930 Discoid lupus erythematosus: Secondary | ICD-10-CM | POA: Diagnosis not present

## 2019-10-02 DIAGNOSIS — Z803 Family history of malignant neoplasm of breast: Secondary | ICD-10-CM | POA: Diagnosis not present

## 2019-10-02 DIAGNOSIS — Z9013 Acquired absence of bilateral breasts and nipples: Secondary | ICD-10-CM | POA: Insufficient documentation

## 2019-10-02 DIAGNOSIS — S21001A Unspecified open wound of right breast, initial encounter: Secondary | ICD-10-CM | POA: Diagnosis not present

## 2019-10-02 DIAGNOSIS — Z79899 Other long term (current) drug therapy: Secondary | ICD-10-CM | POA: Insufficient documentation

## 2019-10-02 DIAGNOSIS — Z452 Encounter for adjustment and management of vascular access device: Secondary | ICD-10-CM | POA: Insufficient documentation

## 2019-10-02 DIAGNOSIS — Z8042 Family history of malignant neoplasm of prostate: Secondary | ICD-10-CM | POA: Insufficient documentation

## 2019-10-02 LAB — CMP (CANCER CENTER ONLY)
ALT: 29 U/L (ref 0–44)
AST: 26 U/L (ref 15–41)
Albumin: 3.9 g/dL (ref 3.5–5.0)
Alkaline Phosphatase: 118 U/L (ref 38–126)
Anion gap: 9 (ref 5–15)
BUN: 7 mg/dL (ref 6–20)
CO2: 26 mmol/L (ref 22–32)
Calcium: 10.2 mg/dL (ref 8.9–10.3)
Chloride: 104 mmol/L (ref 98–111)
Creatinine: 0.72 mg/dL (ref 0.44–1.00)
GFR, Est AFR Am: >60 mL/min (ref >60)
GFR, Estimated: >60 mL/min (ref >60)
Glucose, Bld: 85 mg/dL (ref 70–99)
Potassium: 4.2 mmol/L (ref 3.5–5.1)
Sodium: 139 mmol/L (ref 135–145)
Total Bilirubin: 0.2 mg/dL — ABNORMAL LOW (ref 0.3–1.2)
Total Protein: 7.4 g/dL (ref 6.5–8.1)

## 2019-10-02 LAB — CBC WITH DIFFERENTIAL (CANCER CENTER ONLY)
Abs Immature Granulocytes: 0.02 10*3/uL (ref 0.00–0.07)
Basophils Absolute: 0.1 10*3/uL (ref 0.0–0.1)
Basophils Relative: 1 %
Eosinophils Absolute: 0.1 10*3/uL (ref 0.0–0.5)
Eosinophils Relative: 1 %
HCT: 41.6 % (ref 36.0–46.0)
Hemoglobin: 13.6 g/dL (ref 12.0–15.0)
Immature Granulocytes: 0 %
Lymphocytes Relative: 30 %
Lymphs Abs: 2.4 10*3/uL (ref 0.7–4.0)
MCH: 28.5 pg (ref 26.0–34.0)
MCHC: 32.7 g/dL (ref 30.0–36.0)
MCV: 87 fL (ref 80.0–100.0)
Monocytes Absolute: 0.6 10*3/uL (ref 0.1–1.0)
Monocytes Relative: 7 %
Neutro Abs: 5.1 10*3/uL (ref 1.7–7.7)
Neutrophils Relative %: 61 %
Platelet Count: 267 10*3/uL (ref 150–400)
RBC: 4.78 MIL/uL (ref 3.87–5.11)
RDW: 13.1 % (ref 11.5–15.5)
WBC Count: 8.3 10*3/uL (ref 4.0–10.5)
nRBC: 0 % (ref 0.0–0.2)

## 2019-10-02 NOTE — Telephone Encounter (Signed)
Spoke to pt concerning mastectomy incision and moving chemo start date 1 wk later.  Pt wishes to see a wound specialist to assess mastectomy incision. Msg sent to Dr. Barry Dienes with pt request.

## 2019-10-02 NOTE — Progress Notes (Signed)
Pharmacist Chemotherapy Monitoring - Initial Assessment    Anticipated start date: 10/06/19  Regimen:  . Are orders appropriate based on the patient's diagnosis, regimen, and cycle? Yes . Does the plan date match the patient's scheduled date? Yes . Is the sequencing of drugs appropriate? Yes . Are the premedications appropriate for the patient's regimen? Yes . Prior Authorization for treatment is: Pending o If applicable, is the correct biosimilar selected based on the patient's insurance? Yes PENDING see Ivent   Organ Function and Labs: Marland Kitchen Are dose adjustments needed based on the patient's renal function, hepatic function, or hematologic function? Yes . Are appropriate labs ordered prior to the start of patient's treatment? Yes . Other organ system assessment, if indicated: trastuzumab: Echo/ MUGA . The following baseline labs, if indicated, have been ordered: N/A  Dose Assessment: . Are the drug doses appropriate? Yes . Are the following correct: o Drug concentrations Yes o IV fluid compatible with drug Yes o Administration routes Yes . Timing of therapy: No see ivent  . If applicable, does the patient have documented access for treatment and/or plans for port-a-cath placement? yes . If applicable, have lifetime cumulative doses been properly documented and assessed? not applicable Lifetime Dose Tracking  No doses have been documented on this patient for the following tracked chemicals: Doxorubicin, Epirubicin, Idarubicin, Daunorubicin, Mitoxantrone, Bleomycin, Oxaliplatin, Carboplatin, Liposomal Doxorubicin  o   Toxicity Monitoring/Prevention: . The patient has the following take home antiemetics prescribed: Ondansetron . The patient has the following take home medications prescribed: N/A . Medication allergies and previous infusion related reactions, if applicable, have been reviewed and addressed. No . The patient's current medication list has been assessed for drug-drug  interactions with their chemotherapy regimen.  significant drug-drug interactions were identified on review.  Order Review: . Are the treatment plan orders signed? Yes . Is the patient scheduled to see a provider prior to their treatment? Yes  I verify that I have reviewed each item in the above checklist and answered each question accordingly.  Merril Nagy D 10/02/2019 8:51 AM

## 2019-10-02 NOTE — Research (Signed)
S1714, A PROSPECTIVE OBSERVATIONAL COHORT STUDY TO DEVELOP A PREDICTIVE MODEL OFTAXANE-INDUCED PERIPHERAL NEUROPATHY IN CANCER PATIENTS.  10/02/19 10:00AM  CONSENT: Met with Stephanie Chase who was accompanied by her daughter in [place] room for 45 minutes to discuss S1714 consent. We reviewed the S1714 consent (protocol version date 09/05/2019 [place] Active Date 09/30/2019) and HIPPA form (dated 05/25/2017) with patient in their entirety. Explained the purpose of the study along with potential risks and benefits of participation. Reviewed the study required assessments and timeline for completing these assessments. Informed patient that participation is completely voluntary and she may withdraw consent at any time. Upon completion of review, patient was offered to ask any questions or express any concerns. Stephanie Chase asked what the assessments include and how much extra blood would be collected if she chose the optional blood portion of the study.  Her questions were answered in their entirety and Stephanie Chase had no further questions. Stephanie Chase signed and dated the consent and HIPPA form voluntarily. Patient did not agree to the optional use of her blood for future research. She did not agree to being contacted by the study for future research. Copies of signed/dated consent and HIPPA form were given to patient for her records. Patient completed all PROs. Eligibility was confirmed by Stephanie Chase and verified by clinical research nurse, Stephanie Chase. Plan: Patient is scheduled to start chemotherapy with Paclitaxel/Trastuzumab Monday (10/06/2019). Currently it is unknown if patient will be getting Paclitaxel or Abraxane (insurance verification pending). I will check on Friday/Monday to verify if she is still eligible for the trial. If she is getting Abraxane, I will inform patient. If patient is getting Paclitaxel, we will complete baseline neuropathy assessments at 8:45AM prior to first chemotherapy  treatment (10/06/2019). Asked patient to contact research nurse if she has any questions or cannot make this appt for any reason. Thanked Stephanie Chase for her time and participation in this clinical research study.    [place] Coordinator, RT (R)(T) 3:33PM

## 2019-10-02 NOTE — Progress Notes (Signed)
Stephanie Chase   [phone] [phone]   Clinic Follow up Note   Patient Care Team: Delsa Bern, MD as PCP - General (Obstetrics and Gynecology) Stephanie Chase as Consulting Physician (Obstetrics and Gynecology) Stephanie Kaufmann, RN as Oncology Nurse Navigator Stephanie Germany, RN as Oncology Nurse Navigator Stephanie Klein, MD as Consulting Physician (General Surgery) Stephanie Merle, MD as Consulting Physician (Hematology) Stephanie Rudd, MD as Consulting Physician (Radiation Oncology) Chase, Amber, PA-C as Physician Assistant (Dermatology) 10/02/2019  CHIEF COMPLAINT: Follow-up mastectomy wound  SUMMARY OF ONCOLOGIC HISTORY: Oncology History Overview Note  Cancer Staging Malignant neoplasm of upper-outer quadrant of right breast in female, estrogen receptor positive ([place]) Staging form: Breast, AJCC 8th Edition - Clinical stage from 07/30/2019: Stage IA (cT1c, cN0, cM0, G3, ER+, PR-, HER2+) - Unsigned    Malignant neoplasm of upper-outer quadrant of right breast in female, estrogen receptor positive ([place])  07/03/2019 Mammogram   Diagnostic Mammogram  IMPRESSION There is a 1.3x1.1cm focal asymmetry in the retroareolar anterior to middle depth right breast 2.2cm from the nipple.  There is a 2 cm focal asymmetry with appearance in the upper outer quadrant posterior depth of right breast 3 cm from nipple -Both suspicious and biopsy recommended.     07/18/2019 Initial Biopsy   Diagnosis 07/18/19 1. Breast, right, needle core biopsy, 12 o'clock - INVASIVE DUCTAL CARCINOMA. 2. Breast, right, needle core biopsy, upper outer quadrant - DUCTAL CARCINOMA IN SITU. Microscopic Comment 1. The invasive carcinoma is nuclear grade 3. The greatest linear extent of tumor in any one core is 11 mm. Ancillary studies will be reported separately. CKAE1AE3, GATA-3, ER and E-cadherin are positive. PAX 8 is negative. CK7 is noncontributory. 2. The in situ carcinoma is high  nuclear grade with central necrosis and calcifications. E-cadherin is positive. P63, Calponin and SMM-1 demonstrate the present of myoepithelium.   07/18/2019 Receptors her2   1. PROGNOSTIC INDICATORS 07/18/19 Results: IMMUNOHISTOCHEMICAL AND MORPHOMETRIC ANALYSIS PERFORMED MANUALLY The tumor cells are POSITIVE for Her2 (3+). Of note, the tumor shows heterogeneity in regards to Her2 expression. Estrogen Receptor: 95%, POSITIVE, STRONG STAINING INTENSITY Progesterone Receptor: 0%, NEGATIVE Proliferation Marker Ki67: 50%   07/24/2019 Initial Diagnosis   Malignant neoplasm of upper-outer quadrant of right breast in female, estrogen receptor positive ([place])   08/01/2019 Breast MRI   IMPRESSION: 1. Known invasive ductal carcinoma measures 1.2 centimeters in the 12 o'clock location. 2. Non mass enhancement measures 2.3 centimeters in the LATERAL portion of the RIGHT breast corresponding to DCIS on biopsy. 3. An additional mass in the UPPER OUTER QUADRANT of the RIGHT breast is 1.5 centimeters and warrants tissue diagnosis. This area correlates with distortion seen mammographically. 4. No axillary adenopathy or ancillary findings. LEFT breast is negative.     08/04/2019 Echocardiogram   IMPRESSIONS     1. Left ventricular ejection fraction, by estimation, is 65 to 70%. The  left ventricle has normal function. The left ventricle has no regional  wall motion abnormalities. Left ventricular diastolic parameters were  normal. The average left ventricular  global longitudinal strain is -20.4 %.   2. Right ventricular systolic function is normal. The right ventricular  size is normal.   3. The mitral valve is normal in structure. No evidence of mitral valve  regurgitation. No evidence of mitral stenosis.   4. The aortic valve is normal in structure. Aortic valve regurgitation is  not visualized. No aortic stenosis is present.    08/10/2019 Genetic Testing  Negative genetic testing:  No  pathogenic variants detected on the [facility] Breast Cancer STAT panel + Common Hereditary Cancers panel. A variant of uncertain significance (VUS) was detected in the ATM gene called c.3834C>A. The report date is 08/10/2019.  The Breast Cancer STAT Panel offered by [facility] includes sequencing and deletion/duplication analysis for the following 9 genes:  ATM, BRCA1, BRCA2, CDH1, CHEK2, PALB2, PTEN, STK11 and TP53. The Common Hereditary Cancers Panel offered by [facility] includes sequencing and/or deletion duplication testing of the following 48 genes: APC, ATM, AXIN2, BARD1, BMPR1A, BRCA1, BRCA2, BRIP1, CDH1, CDK4, CDKN2A (p14ARF), CDKN2A (p16INK4a), CHEK2, CTNNA1, DICER1, EPCAM (Deletion/duplication testing only), GREM1 (promoter region deletion/duplication testing only), KIT, MEN1, MLH1, MSH2, MSH3, MSH6, MUTYH, NBN, NF1, NHTL1, PALB2, PDGFRA, PMS2, POLD1, POLE, PTEN, RAD50, RAD51C, RAD51D, RNF43, SDHB, SDHC, SDHD, SMAD4, SMARCA4. STK11, TP53, TSC1, TSC2, and VHL.  The following genes were evaluated for sequence changes only: SDHA and HOXB13 c.251G>A variant only.    09/05/2019 Surgery   BILATERAL MASTECTOMY WITH RIGHT SENTINEL LYMPH NODE BIOPSY and PAC placement by Dr Barry Dienes    09/05/2019 Pathology Results   FINAL MICROSCOPIC DIAGNOSIS:   A. BREAST, LEFT, MASTECTOMY:  - Benign breast tissue.   B. BREAST, RIGHT, MASTECTOMY:  - Invasive ductal carcinoma, multifocal, 1.4 cm in greatest dimension,  [place] grade 3 of 3.  - Ductal carcinoma in situ, high nuclear grade with central necrosis and  calcifications.  - Margins of resection are not involved.  - Biopsy sites.  - See oncology table.   C. SENTINEL LYMPH NODE, RIGHT AXILLARY #1, BIOPSY:  - One lymph node, negative for carcinoma (0/1).   D. SENTINEL LYMPH NODE, RIGHT AXILLARY #2, BIOPSY:  - One lymph node, negative for carcinoma (0/1).   E. SENTINEL LYMPH NODE, RIGHT AXILLARY #3, BIOPSY:  - One lymph node, negative for carcinoma (0/1).      Estrogen Receptor: 95%, positive, strong             Progesterone Receptor: 0%, negative             HER2: Positive (3+)             Ki-67: 50%    09/05/2019 Cancer Staging   Staging form: Breast, AJCC 8th Edition - Pathologic stage from 09/05/2019: Stage IA (pT1c, pN0, cM0, G3, ER+, PR-, HER2+) - Signed by Gardenia Phlegm, NP on 09/17/2019   10/06/2019 -  Chemotherapy   Weekly Taxol and Herceptin for 12 weeks starting 10/06/19 and continue maintenance Herceptin q3weeks alone to complete 1 year treatment.      CURRENT THERAPY: Pending adjuvant chemo with weekly Taxol and Herceptin every 12 weeks then maintenance Herceptin to complete 1 year  INTERVAL HISTORY: Ms. Kiesling returns for symptom management visit.  Last week she developed yellowing over her right mastectomy incision and called surgeon's on-call number. She was told to go to urgent care who started her on bactrim on 09/27/19 for ceelulitis. Subsequently developed small amount of drainage and concern for redness so she went to ED on 7/5 worried about infection. Photos were taken in ED and reviewed by Dr. Marcello Moores who felt this likely showed small areas of tissue ischemia at the edges but did not appear infected. Ms. Albarracin also was noted to have a rash on her chest/trunk and arms so bactrim was stopped. She was instructed to make f/u appt with Dr. Barry Dienes. She has been keeping the wound clean in the interim per instruction.   Today she presents for  evaluation. She has minimal drainage, changes dressing once per day. No enlarging areas of redness. The rash from bactrim resolved. Denies significant pain. No fever or chills. She is a current smoker. No concerns with left incision.  Otherwise, she has attended chemo education session and has no f/u questions about chemo, just anxious.    MEDICAL HISTORY:  Past Medical History:  Diagnosis Date  . Anxiety   . Asthma   . COPD (chronic obstructive pulmonary disease) ([place])   .  Family history of bladder cancer   . Family history of BRCA gene mutation   . Family history of breast cancer   . Family history of prostate cancer   . Hypothyroidism   . Lupus ([place])   . Thyroid disease     SURGICAL HISTORY: Past Surgical History:  Procedure Laterality Date  . BREAST LUMPECTOMY    . MASTECTOMY W/ SENTINEL NODE BIOPSY Bilateral 09/05/2019   Procedure: BILATERAL MASTECTOMY WITH RIGHT SENTINEL LYMPH NODE BIOPSY;  Surgeon: Stephanie Klein, MD;  Location: [facility];  Service: General;  Laterality: Bilateral;  COMBINED WITH REGIONAL FOR POST OP PAIN  . PORTACATH PLACEMENT Left 09/05/2019   Procedure: INSERTION PORT-A-CATH WITH ULTRASOUND GUIDANCE;  Surgeon: Stephanie Klein, MD;  Location: [facility];  Service: General;  Laterality: Left;  . TONSILLECTOMY    . WISDOM TOOTH EXTRACTION      I have reviewed the social history and family history with the patient and they are unchanged from previous note.  ALLERGIES:  is allergic to bactrim [sulfamethoxazole-trimethoprim].  MEDICATIONS:  Current Outpatient Medications  Medication Sig Dispense Refill  . acetaminophen (TYLENOL) 500 MG tablet Take 1,000 mg by mouth every 6 (six) hours as needed for moderate pain.    . Albuterol Sulfate (PROAIR RESPICLICK) 315 (90 Base) MCG/ACT AEPB Inhale 2 puffs into the lungs every 6 (six) hours as needed. (Patient taking differently: Inhale 2 puffs into the lungs every 6 (six) hours as needed (SOB). ) 1 each 3  . ALPRAZolam (XANAX) 0.25 MG tablet Take 1 tablet (0.25 mg total) by mouth 2 (two) times daily as needed for anxiety. 20 tablet 0  . Ascorbic Acid (VITAMIN C) 1000 MG tablet Take 1,000 mg by mouth daily.    . cetirizine (ZYRTEC) 10 MG tablet Take 10 mg by mouth daily as needed for allergies.     . cholecalciferol (VITAMIN D3) 25 MCG (1000 UNIT) tablet Take 1,000 Units by mouth daily.    Marland Kitchen dextromethorphan (DELSYM) 30 MG/5ML liquid Take 60 mg by mouth 2 (two) times daily as needed for cough.    .  fluticasone (FLONASE) 50 MCG/ACT nasal spray Place 1 spray into both nostrils daily as needed for allergies.     Marland Kitchen levothyroxine (SYNTHROID) 75 MCG tablet Take 75 mcg by mouth daily before breakfast.    . lidocaine-prilocaine (EMLA) cream Apply 1 application topically as needed. 30 g 1  . lidocaine-prilocaine (EMLA) cream Apply to affected area once 30 g 3  . liothyronine (CYTOMEL) 5 MCG tablet TAKE 2 TABLETS (=10MCG     TOTAL)     DAILY (Patient taking differently: Take 10 mcg by mouth daily. TAKE 2 TABLETS (=10MCG     TOTAL)     DAILY) 180 tablet 1  . methocarbamol (ROBAXIN) 500 MG tablet Take 1 tablet (500 mg total) by mouth every 6 (six) hours as needed (use for muscle cramps/pain). 20 tablet 1  . ondansetron (ZOFRAN) 8 MG tablet Take 1 tablet (8 mg total) by mouth  every 8 (eight) hours as needed for nausea or vomiting. 20 tablet 1  . ondansetron (ZOFRAN) 8 MG tablet Take 1 tablet (8 mg total) by mouth 2 (two) times daily as needed (Nausea or vomiting). 30 tablet 1  . OVER THE COUNTER MEDICATION Take 1 each by mouth 2 (two) times daily as needed (anxiety / sleep). CBD Gummies 10 mg    . oxyCODONE (OXY IR/ROXICODONE) 5 MG immediate release tablet Take 1 tablet (5 mg total) by mouth every 6 (six) hours as needed for breakthrough pain. 20 tablet 0  . prochlorperazine (COMPAZINE) 10 MG tablet Take 1 tablet (10 mg total) by mouth every 6 (six) hours as needed for nausea or vomiting. 30 tablet 1  . prochlorperazine (COMPAZINE) 10 MG tablet Take 1 tablet (10 mg total) by mouth every 6 (six) hours as needed (Nausea or vomiting). 30 tablet 1   No current facility-administered medications for this visit.    PHYSICAL EXAMINATION:  Vitals:   10/02/19 1140  BP: (!) 126/91  Pulse: 65  Resp: 17  Temp: (!) 97.2 F (36.2 C)  SpO2: 100%   Filed Weights   10/02/19 1140  Weight: 157 lb 12.8 oz (71.6 kg)    GENERAL:alert, no distress and comfortable SKIN: no rash to exposed skin.  EYES:  sclera  clear LUNGS:  normal breathing effort BREAST: s/p bilateral mastectomy. Left incision closed without erythema or drainage. dermabond still intact. Right breast with few areas of ischemic/necrotic tissue in the central incision. Minimal drainage, no significant erythema or edema. See image below.       LABORATORY DATA:  I have reviewed the data as listed CBC Latest Ref Rng & Units 10/02/2019 09/07/2019 09/06/2019  WBC 4.0 - 10.5 K/uL 8.3 8.9 11.2(H)  Hemoglobin 12.0 - 15.0 g/dL 13.6 11.2(L) 11.4(L)  Hematocrit 36 - 46 % 41.6 34.1(L) 34.9(L)  Platelets 150 - 400 K/uL 267 240 247     CMP Latest Ref Rng & Units 10/02/2019 09/06/2019 07/30/2019  Glucose 70 - 99 mg/dL 85 109(H) 79  BUN 6 - 20 mg/dL 7 <5(L) 6  Creatinine 0.44 - 1.00 mg/dL 0.72 0.52 0.71  Sodium 135 - 145 mmol/L 139 138 137  Potassium 3.5 - 5.1 mmol/L 4.2 4.0 4.0  Chloride 98 - 111 mmol/L 104 106 101  CO2 22 - 32 mmol/L '26 24 26  ' Calcium 8.9 - 10.3 mg/dL 10.2 9.4 9.8  Total Protein 6.5 - 8.1 g/dL 7.4 - 7.4  Total Bilirubin 0.3 - 1.2 mg/dL 0.2(L) - 0.3  Alkaline Phos 38 - 126 U/L 118 - 112  AST 15 - 41 U/L 26 - 24  ALT 0 - 44 U/L 29 - 22      RADIOGRAPHIC STUDIES: I have personally reviewed the radiological images as listed and agreed with the findings in the report. No results found.   ASSESSMENT & PLAN: Stephanie Chase is a 59 y.o. female with   1.  Right mastectomy wound  -She had been healing well from bilateral mastectomy on 6/11 per Dr. Barry Dienes until she developed yellow tissue and mild drainage on 7/3 -Seen at urgent care on 7/3 and was treated for cellulitis with Bactrim -2 days later she developed a rash on her chest, back, and arms felt to be from bactrim, with redness at the incision.  She was told to go to the ED for evaluation -Images in the ED consistent with ischemic tissue, no concern for cellulitis.  Bactrim was stopped and rash resolved -Today's  exam shows small area of necrosis to the central incision,  no signs of infection. See image. The CBC is normal which is encouraging. -The images from today were remotely reviewed by Dr. Barry Dienes who cleared her to begin chemo on 7/12 as planned. I offered to bring her back to clinic 7/14 or 7/15 for close f/u and wound check.  -However, the patient is concerned about starting chemo and wishes to be seen by Dr. Barry Dienes in clinic prior to starting treatment, she is very apprehensive about chemo side effects and further wound issues. -We will postpone chemo to 7/19, follow-up before treatment.  I have asked our breast navigator to help arrange a follow-up with Dr. Barry Dienes in the meantime. -She will continue to keep the area clean.  I strongly encouraged her to try to quit smoking to promote wound healing  2.Malignant neoplasm of upper-outer quadrant of right breast,invasive ductal carcinoma and DCIS,StageIA,pT1cN0M0,ER+/HER2+, PR-,Grade III -She was diagnosed in 06/2019. Given multifocal disease, she underwent right mastectomy and SLNB and prophylactic left mastectomy by Dr Barry Dienes on 09/05/19. Left chest port placed during surgery. -Due to node-negative, postmastectomy radiation is not recommended -Given her HER2 positive high grade disease, adjuvant chemotherapy is recommended to reduce her high risk of recurrence. Given her small tumor and negative node,  Dr. Burr Medico recommended less intensive adjuvantchemotherapy with weekly Taxol and anti-HER2 Herceptin for 12 weeks and continue maintenance Herceptin q3weeks alone to complete 1 year treatment. She has the option of Herceptin injections after chemo.  She consented to treatment.  Goal is curative. -plan to begin adjuvant anti-estrogen therapy after chemo -Chemo plan start date 7/19   3. Genetic Testing  -She has family history of breast, bladder, prostate cancer.  -She does have negative genetic testing: No pathogenic variants detected on the [facility] Breast Cancer STAT panel + Common Hereditary Cancers panel.  A variant of uncertain significance (VUS) was detected in the ATM gene called c.3834C>A. The report date is 08/10/2019. She can repeat testing in 5-10 years.   4. Smoking Cessation, COPD -She has been smoking since the 2nd grade. She has been able to cut down recently to half pack per day.  -Per pt at some point she was diagnosed with COPD or Emphysema. She is on albuterol inhaler and Singulair.  -Her wound necrosis is possibly related to smoking  5. Comorbidities:DiscoidLupus, Vitiligo, Thyroid disease.  -She was diagnosed with Lupus 10 years ago. She uses steroid cream as needed for her lupus rashes, some skin lesions of which have been removed. -She sees her dermatologist Dr Nevada Crane and their [place].  -She is on 2 thyroid medications.   6. Social Support, Anxiety  -Ongoing -Started on low dose Xanax PRN  07/30/2019, okay to take before chemo.  -She previously declined social work referral  PLAN: -Lab reviewed  -Discussed with Dr. Barry Dienes and Dr. Burr Medico -Patient wishes to be evaluated by Dr. Barry Dienes before starting chemo, I have asked breast navigator to assist with scheduling that appointment -Postpone C1 taxol/herceptin to 7/19 -F/u before treatment  -Encouraged to quit smoking  All questions were answered. The patient knows to call the clinic with any problems, questions or concerns. No barriers to learning was detected. Total encounter time was 30 minutes.      Alla Feeling, NP 10/02/19

## 2019-10-02 NOTE — Research (Signed)
DCP-001 USE OF A CLINICAL TRIAL SCREENING TOOL TO ADDRESS CANCER HEALTH DISPARITIES IN THE [facility] COMMUNITY ONCOLOGY RESEARCH PROGRAM ([facility])  10/02/19 10:45AM  DCP-001CONSENT:Discussed DCP-001 with Stephanie Chase after obtaining signed consent for [id number]. WereviewedtheDCP-001 consent form (protocol version date 09/17/18, [place] Active Date 04/01/19) in full, including the voluntary nature of participation, the project purpose, risks and benefits, and who will see the provided information. Stephanie Chase verbalizes understanding that this study is a one-time consent for collection of demographic variables and that no patient identifiers are being reported. We also reviewed the [place] form (dated 05/18/14) including the data to be collected, why the information is being collected, who will see the information, and safety measures to keep information private.Upon completion of review,Darleneverbalizes a desire to voluntarily participate in DCP-001 in addition to U7654. All consent and authorization forms were signed and dated by the patient andmyself as the person obtaining consent andsigned copiesof both documentswere providedtopatient.  DCP-001 WORKSHEET:After informed consent was obtained, the DCP-001 Worksheet was given to patient to collect ethnicity, language, literacy, education, employment, income and smoking history not available in the electronic medical record. Stephanie Chase and her daughter were thanked for their time.  Stephanie Chase RT(R)(T) Clinical Research Coordinator 3:36PM

## 2019-10-03 ENCOUNTER — Encounter: Payer: Self-pay | Admitting: *Deleted

## 2019-10-03 ENCOUNTER — Telehealth: Payer: Self-pay | Admitting: Hematology

## 2019-10-03 ENCOUNTER — Telehealth: Payer: Self-pay | Admitting: *Deleted

## 2019-10-03 ENCOUNTER — Other Ambulatory Visit: Payer: Self-pay | Admitting: *Deleted

## 2019-10-03 DIAGNOSIS — S21001A Unspecified open wound of right breast, initial encounter: Secondary | ICD-10-CM

## 2019-10-03 NOTE — Telephone Encounter (Signed)
Scheduled per 7/8 los.

## 2019-10-03 NOTE — Telephone Encounter (Signed)
Called pt and confirmed appt with Dr. Marla Roe to assess mastectomy incision as well as appts at [facility] for chemo and Dr. Burr Medico on 7/19. Denies further needs at this time.

## 2019-10-06 ENCOUNTER — Other Ambulatory Visit: Payer: BC Managed Care – PPO

## 2019-10-06 ENCOUNTER — Ambulatory Visit: Payer: BC Managed Care – PPO

## 2019-10-06 ENCOUNTER — Inpatient Hospital Stay: Payer: BC Managed Care – PPO | Admitting: Hematology

## 2019-10-06 ENCOUNTER — Inpatient Hospital Stay: Payer: BC Managed Care – PPO

## 2019-10-06 ENCOUNTER — Ambulatory Visit: Payer: BC Managed Care – PPO | Admitting: Hematology

## 2019-10-06 ENCOUNTER — Ambulatory Visit: Payer: BC Managed Care – PPO | Admitting: Nurse Practitioner

## 2019-10-07 ENCOUNTER — Ambulatory Visit (INDEPENDENT_AMBULATORY_CARE_PROVIDER_SITE_OTHER): Payer: BC Managed Care – PPO | Admitting: Plastic Surgery

## 2019-10-07 ENCOUNTER — Encounter: Payer: Self-pay | Admitting: Plastic Surgery

## 2019-10-07 ENCOUNTER — Ambulatory Visit: Payer: BC Managed Care – PPO

## 2019-10-07 ENCOUNTER — Telehealth: Payer: Self-pay

## 2019-10-07 ENCOUNTER — Encounter: Payer: Self-pay | Admitting: *Deleted

## 2019-10-07 ENCOUNTER — Other Ambulatory Visit: Payer: Self-pay

## 2019-10-07 VITALS — BP 130/86 | HR 89 | Temp 98.2°F | Ht 65.0 in | Wt 158.8 lb

## 2019-10-07 DIAGNOSIS — S21001A Unspecified open wound of right breast, initial encounter: Secondary | ICD-10-CM

## 2019-10-07 DIAGNOSIS — R0789 Other chest pain: Secondary | ICD-10-CM

## 2019-10-07 DIAGNOSIS — Z9013 Acquired absence of bilateral breasts and nipples: Secondary | ICD-10-CM | POA: Diagnosis not present

## 2019-10-07 DIAGNOSIS — Z170 Estrogen receptor positive status [ER+]: Secondary | ICD-10-CM

## 2019-10-07 DIAGNOSIS — M25511 Pain in right shoulder: Secondary | ICD-10-CM

## 2019-10-07 DIAGNOSIS — M329 Systemic lupus erythematosus, unspecified: Secondary | ICD-10-CM

## 2019-10-07 DIAGNOSIS — C50411 Malignant neoplasm of upper-outer quadrant of right female breast: Secondary | ICD-10-CM

## 2019-10-07 DIAGNOSIS — M25611 Stiffness of right shoulder, not elsewhere classified: Secondary | ICD-10-CM

## 2019-10-07 DIAGNOSIS — M25612 Stiffness of left shoulder, not elsewhere classified: Secondary | ICD-10-CM

## 2019-10-07 DIAGNOSIS — R293 Abnormal posture: Secondary | ICD-10-CM

## 2019-10-07 DIAGNOSIS — M25512 Pain in left shoulder: Secondary | ICD-10-CM

## 2019-10-07 MED ORDER — ZINC SULFATE 220 (50 ZN) MG PO TABS: 1.0000 | ORAL_TABLET | Freq: Two times a day (BID) | ORAL | 0 refills | Status: DC

## 2019-10-07 NOTE — Progress Notes (Signed)
Patient ID: Stephanie Chase, female    DOB: March 09, 1961, 59 y.o.   MRN: [id number]   Chief Complaint  Patient presents with  . Consult  . Breast Problem    The patient is a 59 year old female here for evaluation of the right breast.  On June 11 the patient underwent bilateral mastectomies.  She is supposed to go for chemotherapy in the next week or so.  Approximately 1 week ago she noticed an opening and a wound on the incision site of the right breast.  She has multiple medical conditions including lupus, thyroid disease, bronchitis, vitamin D deficiency and hyperlipidemia.  The wound is ~1.5 x 8 cm long.  It does not appear infected at this time.  There is some fibrous tissue and a little bit of yellowish discoloration.  She has been on antibiotics that were given to her from the emergency room.  She has been treating it with antibiotic ointment.  She had a reaction to something and ended up with a rash.  I do not feel any seroma or hematoma in the pocket on either side.  Her left side is healing but very slowly.  She is a smoker of sometimes 2 packs/day but is down to less than a pack per day.   Review of Systems  Constitutional: Positive for activity change. Negative for appetite change.  Eyes: Negative.   Respiratory: Negative.  Negative for chest tightness and shortness of breath.   Cardiovascular: Negative for leg swelling.  Gastrointestinal: Negative for abdominal distention.  Endocrine: Negative.   Genitourinary: Negative.   Skin: Positive for color change, rash and wound.  Neurological: Negative.   Hematological: Negative.   Psychiatric/Behavioral: The patient is nervous/anxious.     Past Medical History:  Diagnosis Date  . Anxiety   . Asthma   . COPD (chronic obstructive pulmonary disease) ([place])   . Family history of bladder cancer   . Family history of BRCA gene mutation   . Family history of breast cancer   . Family history of prostate cancer   . Hypothyroidism   .  Lupus ([place])   . Thyroid disease     Past Surgical History:  Procedure Laterality Date  . BREAST LUMPECTOMY    . MASTECTOMY W/ SENTINEL NODE BIOPSY Bilateral 09/05/2019   Procedure: BILATERAL MASTECTOMY WITH RIGHT SENTINEL LYMPH NODE BIOPSY;  Surgeon: Stark Klein, MD;  Location: [facility];  Service: General;  Laterality: Bilateral;  COMBINED WITH REGIONAL FOR POST OP PAIN  . PORTACATH PLACEMENT Left 09/05/2019   Procedure: INSERTION PORT-A-CATH WITH ULTRASOUND GUIDANCE;  Surgeon: Stark Klein, MD;  Location: [facility];  Service: General;  Laterality: Left;  . TONSILLECTOMY    . WISDOM TOOTH EXTRACTION        Current Outpatient Medications:  .  acetaminophen (TYLENOL) 500 MG tablet, Take 1,000 mg by mouth every 6 (six) hours as needed for moderate pain., Disp: , Rfl:  .  Albuterol Sulfate (PROAIR RESPICLICK) 409 (90 Base) MCG/ACT AEPB, Inhale 2 puffs into the lungs every 6 (six) hours as needed. (Patient taking differently: Inhale 2 puffs into the lungs every 6 (six) hours as needed (SOB). ), Disp: 1 each, Rfl: 3 .  ALPRAZolam (XANAX) 0.25 MG tablet, Take 1 tablet (0.25 mg total) by mouth 2 (two) times daily as needed for anxiety., Disp: 20 tablet, Rfl: 0 .  Ascorbic Acid (VITAMIN C) 1000 MG tablet, Take 1,000 mg by mouth daily., Disp: , Rfl:  .  cetirizine (ZYRTEC)  10 MG tablet, Take 10 mg by mouth daily as needed for allergies. , Disp: , Rfl:  .  cholecalciferol (VITAMIN D3) 25 MCG (1000 UNIT) tablet, Take 1,000 Units by mouth daily., Disp: , Rfl:  .  dextromethorphan (DELSYM) 30 MG/5ML liquid, Take 60 mg by mouth 2 (two) times daily as needed for cough., Disp: , Rfl:  .  fluticasone (FLONASE) 50 MCG/ACT nasal spray, Place 1 spray into both nostrils daily as needed for allergies. , Disp: , Rfl:  .  levothyroxine (SYNTHROID) 75 MCG tablet, Take 75 mcg by mouth daily before breakfast., Disp: , Rfl:  .  lidocaine-prilocaine (EMLA) cream, Apply 1 application topically as needed., Disp: 30 g, Rfl: 1 .   lidocaine-prilocaine (EMLA) cream, Apply to affected area once, Disp: 30 g, Rfl: 3 .  liothyronine (CYTOMEL) 5 MCG tablet, TAKE 2 TABLETS (=10MCG     TOTAL)     DAILY (Patient taking differently: Take 10 mcg by mouth daily. TAKE 2 TABLETS (=10MCG     TOTAL)     DAILY), Disp: 180 tablet, Rfl: 1 .  methocarbamol (ROBAXIN) 500 MG tablet, Take 1 tablet (500 mg total) by mouth every 6 (six) hours as needed (use for muscle cramps/pain)., Disp: 20 tablet, Rfl: 1 .  ondansetron (ZOFRAN) 8 MG tablet, Take 1 tablet (8 mg total) by mouth every 8 (eight) hours as needed for nausea or vomiting., Disp: 20 tablet, Rfl: 1 .  ondansetron (ZOFRAN) 8 MG tablet, Take 1 tablet (8 mg total) by mouth 2 (two) times daily as needed (Nausea or vomiting)., Disp: 30 tablet, Rfl: 1 .  OVER THE COUNTER MEDICATION, Take 1 each by mouth 2 (two) times daily as needed (anxiety / sleep). CBD Gummies 10 mg, Disp: , Rfl:  .  oxyCODONE (OXY IR/ROXICODONE) 5 MG immediate release tablet, Take 1 tablet (5 mg total) by mouth every 6 (six) hours as needed for breakthrough pain., Disp: 20 tablet, Rfl: 0 .  prochlorperazine (COMPAZINE) 10 MG tablet, Take 1 tablet (10 mg total) by mouth every 6 (six) hours as needed for nausea or vomiting., Disp: 30 tablet, Rfl: 1 .  prochlorperazine (COMPAZINE) 10 MG tablet, Take 1 tablet (10 mg total) by mouth every 6 (six) hours as needed (Nausea or vomiting)., Disp: 30 tablet, Rfl: 1   Objective:   Vitals:   10/07/19 0820  BP: 130/86  Pulse: 89  Temp: 98.2 F (36.8 C)  SpO2: 97%    Physical Exam Vitals and nursing note reviewed.  Constitutional:      Appearance: Normal appearance.  HENT:     Head: Normocephalic and atraumatic.  Cardiovascular:     Rate and Rhythm: Normal rate.     Pulses: Normal pulses.     Heart sounds: No murmur heard.   Pulmonary:     Effort: Pulmonary effort is normal.  Abdominal:     General: Abdomen is flat. There is no distension.  Skin:    Capillary Refill:  Capillary refill takes less than 2 seconds.  Neurological:     General: No focal deficit present.     Mental Status: She is alert and oriented to person, place, and time.  Psychiatric:        Mood and Affect: Mood normal.        Behavior: Behavior normal.        Thought Content: Thought content normal.     Assessment & Plan:  Systemic lupus erythematosus, unspecified SLE type, unspecified organ involvement status ([place])  Malignant neoplasm of upper-outer quadrant of right female breast, unspecified estrogen receptor status ([place])  Acquired absence of breast and absent nipple, bilateral  Breast wound, right, initial encounter  I would like to give her a week to try and improve her overall nutritional status.  For the next week I had like her to decrease her smoking, take a multivitamin, add a vitamin C and try to take the zinc that I will call in for her.  I would like her to use Xeroform or Vaseline on the wound and keep it covered with a silicone border dressing.  She can shower with Dial or Dove soap.  She can continue with physical therapy just take it easy on the right side.  I did present the option of surgical excision and closure.  This is an option but without her nutritional status being somewhat normal she will open up again.  Thus far I want give her a week to see if she can increase her protein.  Pictures were obtained of the patient and placed in the chart with the patient's or guardian's permission.   [place], DO

## 2019-10-07 NOTE — Telephone Encounter (Signed)
Faxed [facility] order for Dr. Marla Roe for: xeroform-daily, and mepilex-daily 30 days

## 2019-10-07 NOTE — Therapy (Signed)
[place], [place], [address] Phone: [phone]   Fax:  [phone]  Physical Therapy Treatment  Patient Details  Name: Stephanie Chase MRN: [id number] Date of Birth: 1960/06/15 Referring Provider (PT): Dr. Stark Klein   Encounter Date: 10/07/2019   PT End of Session - 10/07/19 1209    Visit Number 4    Number of Visits 10    Date for PT Re-Evaluation 10/30/19    PT Start Time 1107    PT Stop Time 1208    PT Time Calculation (min) 61 min    Activity Tolerance Patient tolerated treatment well    Behavior During Therapy [facility] for tasks assessed/performed           Past Medical History:  Diagnosis Date  . Anxiety   . Asthma   . COPD (chronic obstructive pulmonary disease) ([place])   . Family history of bladder cancer   . Family history of BRCA gene mutation   . Family history of breast cancer   . Family history of prostate cancer   . Hypothyroidism   . Lupus ([place])   . Thyroid disease     Past Surgical History:  Procedure Laterality Date  . BREAST LUMPECTOMY    . MASTECTOMY W/ SENTINEL NODE BIOPSY Bilateral 09/05/2019   Procedure: BILATERAL MASTECTOMY WITH RIGHT SENTINEL LYMPH NODE BIOPSY;  Surgeon: Stark Klein, MD;  Location: [facility];  Service: General;  Laterality: Bilateral;  COMBINED WITH REGIONAL FOR POST OP PAIN  . PORTACATH PLACEMENT Left 09/05/2019   Procedure: INSERTION PORT-A-CATH WITH ULTRASOUND GUIDANCE;  Surgeon: Stark Klein, MD;  Location: [facility];  Service: General;  Laterality: Left;  . TONSILLECTOMY    . WISDOM TOOTH EXTRACTION      There were no vitals filed for this visit.   Subjective Assessment - 10/07/19 1126    Subjective I saw Dr. Marla Roe this morning and she said I can continue therapy for now but because of the wound very gentle ROM on my Rt side, but it's okay to work more with the Left side.    Pertinent History Bil masectomy 09/05/19 with 3 lymph nodes removed  and negative on the R and benign tisuse on the L. Patient was diagnosed on 06/09/2019 with right grade III invasive ductal carcinoma breast cancer. It mesures 1.3 cm in the upper outer quadrant with 1.6 cm of calcifications and DCIS. It is ER positive, PR negative, and HER2 positive with a Ki67 of 50%. She smokes about 1 pack/day.    Patient Stated Goals I want to get back to kayaking.    Currently in Pain? No/denies   just pulling at incisions                 L-DEX FLOWSHEETS - 10/07/19 1100      L-DEX LYMPHEDEMA SCREENING   BASELINE SCORE (UNILATERAL) -2.5    L-DEX SCORE (UNILATERAL) 0    VALUE CHANGE (UNILAT) 2.5                     [facility] Adult PT Treatment/Exercise - 10/07/19 0001      Shoulder Exercises: Pulleys   Flexion 2 minutes    Flexion Limitations Pt returned therapist demo and cuing to relax shoulders     Scaption 2 minutes    Scaption Limitations Pt returned therapist demo      Manual Therapy   Manual Therapy Passive ROM    Passive ROM In Supine: Gently to  Rt side into flexion and abduction, then to Lt side into flexion, abduction, er and D2 to pts tolerance stablizing both incisions throughout so no pull, especially at Rt.                    PT Short Term Goals - 09/25/19 1202      PT SHORT TERM GOAL #1   Title Pt will be independent with HEP and Modified MLD for the anterior trunk within 2 weeks in order to demonstrate autonomy of care.    Baseline pt provided with HEP today    Time 2    Period Weeks    Status New    Target Date 10/16/19             PT Long Term Goals - 09/25/19 1203      PT LONG TERM GOAL #1   Title Pt will demonstrate 150 degrees bil shoulder flexion and abduction within 4 weeks to demonstrate return to pre-operative AROM.    Baseline R shoulder flexion: 110, abduction: 69 L shoulder flexion: 118, abduction:  77    Time 4    Period Weeks    Status New    Target Date 10/30/19      PT LONG TERM GOAL #2     Title Pt will report 50% improvement in functional mobility and pain in order to demonstrate an improvement in subjective quality of life.    Baseline 2/10 pain, decresaed ROM below shoulder height. MOdifications with bathing/dressing    Time 4    Period Weeks    Status New    Target Date 10/30/19                 Plan - 10/07/19 1210    Clinical Impression Statement Pt reports already being able to tell an improvement with her A/ROM, Lt>Rt side, with reaching to her higher shelves at home. She just saw Dr. Marla Roe this morning about necrosis at Rt mastectomy incision. She was given wound care instructions and told okay to cont physical therapy to Rt shoulder, just very gentle ROM for now without pulling at incision. Pt tolerated all stretches very well today. L-Dex done as pt is 1 month out of surgery. Goes back to Dr. Marla Roe 7/23 to determine if debridement needed.    Personal Factors and Comorbidities Comorbidity 1    Comorbidities bil masectomy with 3 lymph node removal on the R    Stability/Clinical Decision Making Stable/Uncomplicated    Rehab Potential Good    PT Frequency 2x / week    PT Duration 4 weeks    PT Treatment/Interventions Therapeutic activities;Therapeutic exercise;Neuromuscular re-education;Manual techniques;Cryotherapy;Moist Heat    PT Next Visit Plan Assess HEP, re-measure ROM, increased AA/ROM and A/ROM activities, being cautious with Rt due to wound and necrotic tissue at Rt masectomy (may need surgical debridement).    PT Home Exercise Plan Access Code: [facility]    Consulted and Agree with Plan of Care Patient           Patient will benefit from skilled therapeutic intervention in order to improve the following deficits and impairments:  Decreased knowledge of precautions, Pain, Decreased range of motion, Increased edema  Visit Diagnosis: Malignant neoplasm of upper-outer quadrant of right breast in female, estrogen receptor positive  (H[facility])  Stiffness of left shoulder, not elsewhere classified  Stiffness of right shoulder, not elsewhere classified  Acute pain of right shoulder  Acute pain of left shoulder  Sternal pain  Abnormal posture     Problem List Patient Active Problem List   Diagnosis Date Noted  . Acquired absence of breast and absent nipple, bilateral 10/07/2019  . Breast wound, right, initial encounter 10/07/2019  . Breast cancer of upper-outer quadrant of right female breast ([place]) 09/05/2019  . Genetic testing 08/12/2019  . Family history of BRCA gene mutation 07/31/2019  . Family history of breast cancer   . Family history of prostate cancer   . Family history of bladder cancer   . Malignant neoplasm of upper-outer quadrant of right breast in female, estrogen receptor positive ([place]) 07/24/2019  . Annual physical exam 03/31/2016  . Occupational bronchitis ([place]) 03/03/2015  . Hyperlipidemia 03/30/2014  . 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 Generalized osteoarthritis 02/23/2012  . Plantar fasciitis, bilateral 02/23/2012  . Vitiligo 02/23/2012  . Difficulty hearing 02/13/2012  . History of lupus 12/21/2011  . Vitamin D deficiency 10/11/2010  . Hypothyroidism 06/21/2010  . Systemic lupus erythematosus ([place]) 06/14/2010    Otelia Limes, PTA 10/07/2019, 12:16 PM  [place], [place], [address] Phone: [phone]   Fax:  [phone]  Name: Nevae Pinnix MRN: [id number] Date of Birth: 03/04/1961

## 2019-10-07 NOTE — Progress Notes (Signed)
Pharmacist Chemotherapy Monitoring - Initial Assessment    Anticipated start date: 10/13/19   Regimen:  . Are orders appropriate based on the patient's diagnosis, regimen, and cycle? Yes . Does the plan date match the patient's scheduled date? Yes . Is the sequencing of drugs appropriate? Yes . Are the premedications appropriate for the patient's regimen? Yes . Prior Authorization for treatment is: Pending o If applicable, is the correct biosimilar selected based on the patient's insurance? yes  Organ Function and Labs: Marland Kitchen Are dose adjustments needed based on the patient's renal function, hepatic function, or hematologic function? Yes . Are appropriate labs ordered prior to the start of patient's treatment? Yes . Other organ system assessment, if indicated: trastuzumab: Echo/ MUGA . The following baseline labs, if indicated, have been ordered: N/A  Dose Assessment: . Are the drug doses appropriate? Yes . Are the following correct: o Drug concentrations Yes o IV fluid compatible with drug Yes o Administration routes Yes o Timing of therapy Yes . If applicable, does the patient have documented access for treatment and/or plans for port-a-cath placement? yes . If applicable, have lifetime cumulative doses been properly documented and assessed? yes Lifetime Dose Tracking  No doses have been documented on this patient for the following tracked chemicals: Doxorubicin, Epirubicin, Idarubicin, Daunorubicin, Mitoxantrone, Bleomycin, Oxaliplatin, Carboplatin, Liposomal Doxorubicin  o   Toxicity Monitoring/Prevention: . The patient has the following take home antiemetics prescribed: Ondansetron and Prochlorperazine . The patient has the following take home medications prescribed: N/A . Medication allergies and previous infusion related reactions, if applicable, have been reviewed and addressed. No . The patient's current medication list has been assessed for drug-drug interactions with their  chemotherapy regimen. no significant drug-drug interactions were identified on review.  Order Review: . Are the treatment plan orders signed? Yes . Is the patient scheduled to see a provider prior to their treatment? Yes  I verify that I have reviewed each item in the above checklist and answered each question accordingly.  Romualdo Bolk [facility] 10/07/2019 2:08 PM

## 2019-10-08 ENCOUNTER — Encounter: Payer: Self-pay | Admitting: *Deleted

## 2019-10-09 ENCOUNTER — Telehealth: Payer: Self-pay | Admitting: Plastic Surgery

## 2019-10-09 ENCOUNTER — Telehealth: Payer: Self-pay | Admitting: Hematology

## 2019-10-09 NOTE — Telephone Encounter (Signed)
Returned patients call. She was wanting to know if she should take the steri strip that is still on the left breast from her surgery back in June. She has a follow up appointment with Dr. Barry Dienes on 10/13/19. Advised her to keep it on until she has her PO follow up next week with her. She also questioned about the dates on her FMLA. Advised her to ask [facility] to extend it to 10/17/19 when she see's Dr. Marla Roe.   She did mention she has cut back smoking and sugar as well as increased her protein intake and vegetables. She did received the xeroform and mepilex border and has been changing that daily.

## 2019-10-09 NOTE — Telephone Encounter (Signed)
Called pt per 7/13 sch msg - pt is aware treatment to start 8/2

## 2019-10-09 NOTE — Telephone Encounter (Signed)
Patient called to advise that tape is still on the incision from a month ago and she wanted to know if she should try to remove it. Please call to advise what she should to care for the incision until her follow up appt on 7/23.   Patient also wanted to know about her medical leave and whether it should be transferred over to Dr. Marla Roe. Advised her to call the department handling her Leave of Absence and see what they recommend since she is nearing the end of her previous out of work period.

## 2019-10-13 ENCOUNTER — Inpatient Hospital Stay: Payer: BC Managed Care – PPO

## 2019-10-13 ENCOUNTER — Ambulatory Visit: Payer: BC Managed Care – PPO

## 2019-10-13 ENCOUNTER — Other Ambulatory Visit: Payer: BC Managed Care – PPO

## 2019-10-13 ENCOUNTER — Ambulatory Visit: Payer: BC Managed Care – PPO | Admitting: Hematology

## 2019-10-13 ENCOUNTER — Inpatient Hospital Stay: Payer: BC Managed Care – PPO | Admitting: Hematology

## 2019-10-13 NOTE — Therapy (Signed)
[place], [place], [address] Phone: [phone]   Fax:  [phone]  Physical Therapy Treatment  Patient Details  Name: Stephanie Chase MRN: [id number] Date of Birth: 15-Jul-1960 Referring Provider (PT): Dr. Stark Klein   Encounter Date: 10/02/2019    Past Medical History:  Diagnosis Date  . Anxiety   . Asthma   . COPD (chronic obstructive pulmonary disease) ([place])   . Family history of bladder cancer   . Family history of BRCA gene mutation   . Family history of breast cancer   . Family history of prostate cancer   . Hypothyroidism   . Lupus ([place])   . Thyroid disease     Past Surgical History:  Procedure Laterality Date  . BREAST LUMPECTOMY    . MASTECTOMY W/ SENTINEL NODE BIOPSY Bilateral 09/05/2019   Procedure: BILATERAL MASTECTOMY WITH RIGHT SENTINEL LYMPH NODE BIOPSY;  Surgeon: Stark Klein, MD;  Location: [facility];  Service: General;  Laterality: Bilateral;  COMBINED WITH REGIONAL FOR POST OP PAIN  . PORTACATH PLACEMENT Left 09/05/2019   Procedure: INSERTION PORT-A-CATH WITH ULTRASOUND GUIDANCE;  Surgeon: Stark Klein, MD;  Location: [facility];  Service: General;  Laterality: Left;  . TONSILLECTOMY    . WISDOM TOOTH EXTRACTION      There were no vitals filed for this visit.       Pt states that she has been experiencing some drainage from her incisions. She was haveing some blood and fluid but on Friday night she started to notice some yellow fluid. Pt states that she went to urgent care and was told she had an infection then went to the ER and was told that her wound was not getting enough blood to the incision. She is going to see Dr. Burr Medico today for blood work and to get her incisions looked at.       VISIT CANCELED DUE TO CONCERNS OF INFECTION.                    PT Short Term Goals - 09/25/19 1202      PT SHORT TERM GOAL #1   Title Pt will be independent with HEP  and Modified MLD for the anterior trunk within 2 weeks in order to demonstrate autonomy of care.    Baseline pt provided with HEP today    Time 2    Period Weeks    Status New    Target Date 10/16/19             PT Long Term Goals - 09/25/19 1203      PT LONG TERM GOAL #1   Title Pt will demonstrate 150 degrees bil shoulder flexion and abduction within 4 weeks to demonstrate return to pre-operative AROM.    Baseline R shoulder flexion: 110, abduction: 69 L shoulder flexion: 118, abduction:  77    Time 4    Period Weeks    Status New    Target Date 10/30/19      PT LONG TERM GOAL #2   Title Pt will report 50% improvement in functional mobility and pain in order to demonstrate an improvement in subjective quality of life.    Baseline 2/10 pain, decresaed ROM below shoulder height. MOdifications with bathing/dressing    Time 4    Period Weeks    Status New    Target Date 10/30/19  Patient will benefit from skilled therapeutic intervention in order to improve the following deficits and impairments:  Decreased knowledge of precautions, Pain, Decreased range of motion, Increased edema  Visit Diagnosis: Malignant neoplasm of upper-outer quadrant of right breast in female, estrogen receptor positive (HCC)  Stiffness of left shoulder, not elsewhere classified  Stiffness of right shoulder, not elsewhere classified  Acute pain of right shoulder  Acute pain of left shoulder  Sternal pain  Abnormal posture     Problem List Patient Active Problem List   Diagnosis Date Noted  . Acquired absence of breast and absent nipple, bilateral 10/07/2019  . Breast wound, right, initial encounter 10/07/2019  . Breast cancer of upper-outer quadrant of right female breast ([place]) 09/05/2019  . Genetic testing 08/12/2019  . Family history of BRCA gene mutation 07/31/2019  . Family history of breast cancer   . Family history of prostate cancer   . Family history  of bladder cancer   . Malignant neoplasm of upper-outer quadrant of right breast in female, estrogen receptor positive ([place]) 07/24/2019  . Annual physical exam 03/31/2016  . Occupational bronchitis ([place]) 03/03/2015  . Hyperlipidemia 03/30/2014  . 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 Generalized osteoarthritis 02/23/2012  . Plantar fasciitis, bilateral 02/23/2012  . Vitiligo 02/23/2012  . Difficulty hearing 02/13/2012  . History of lupus 12/21/2011  . Vitamin D deficiency 10/11/2010  . Hypothyroidism 06/21/2010  . Systemic lupus erythematosus ([place]) 06/14/2010   Annia Friendly, PT 10/13/19 8:26 AM  [place], [place], [address] Phone: [phone]   Fax:  [phone]  Name: Jesseca Marsch MRN: [id number] Date of Birth: 10/25/1960

## 2019-10-16 ENCOUNTER — Ambulatory Visit: Payer: BC Managed Care – PPO

## 2019-10-16 ENCOUNTER — Other Ambulatory Visit: Payer: Self-pay | Admitting: Hematology

## 2019-10-16 ENCOUNTER — Other Ambulatory Visit: Payer: Self-pay

## 2019-10-16 DIAGNOSIS — R0789 Other chest pain: Secondary | ICD-10-CM

## 2019-10-16 DIAGNOSIS — M25511 Pain in right shoulder: Secondary | ICD-10-CM

## 2019-10-16 DIAGNOSIS — M25612 Stiffness of left shoulder, not elsewhere classified: Secondary | ICD-10-CM

## 2019-10-16 DIAGNOSIS — Z170 Estrogen receptor positive status [ER+]: Secondary | ICD-10-CM

## 2019-10-16 DIAGNOSIS — C50411 Malignant neoplasm of upper-outer quadrant of right female breast: Secondary | ICD-10-CM

## 2019-10-16 DIAGNOSIS — M25611 Stiffness of right shoulder, not elsewhere classified: Secondary | ICD-10-CM

## 2019-10-16 DIAGNOSIS — M25512 Pain in left shoulder: Secondary | ICD-10-CM

## 2019-10-16 DIAGNOSIS — R293 Abnormal posture: Secondary | ICD-10-CM

## 2019-10-16 NOTE — Therapy (Signed)
[place], [place], [address] Phone: [phone]   Fax:  [phone]  Physical Therapy Treatment  Patient Details  Name: Stephanie Chase MRN: [id number] Date of Birth: 1960/04/26 Referring Provider (PT): Dr. Stark Klein   Encounter Date: 10/16/2019   PT End of Session - 10/16/19 1241    Visit Number 5    Number of Visits 10    Date for PT Re-Evaluation 10/30/19    PT Start Time 1106    PT Stop Time 1202    PT Time Calculation (min) 56 min    Activity Tolerance Patient tolerated treatment well    Behavior During Therapy [facility] for tasks assessed/performed           Past Medical History:  Diagnosis Date  . Anxiety   . Asthma   . COPD (chronic obstructive pulmonary disease) ([place])   . Family history of bladder cancer   . Family history of BRCA gene mutation   . Family history of breast cancer   . Family history of prostate cancer   . Hypothyroidism   . Lupus ([place])   . Thyroid disease     Past Surgical History:  Procedure Laterality Date  . BREAST LUMPECTOMY    . MASTECTOMY W/ SENTINEL NODE BIOPSY Bilateral 09/05/2019   Procedure: BILATERAL MASTECTOMY WITH RIGHT SENTINEL LYMPH NODE BIOPSY;  Surgeon: Stark Klein, MD;  Location: [place];  Service: General;  Laterality: Bilateral;  COMBINED WITH REGIONAL FOR POST OP PAIN  . PORTACATH PLACEMENT Left 09/05/2019   Procedure: INSERTION PORT-A-CATH WITH ULTRASOUND GUIDANCE;  Surgeon: Stark Klein, MD;  Location: [place];  Service: General;  Laterality: Left;  . TONSILLECTOMY    . WISDOM TOOTH EXTRACTION      There were no vitals filed for this visit.   Subjective Assessment - 10/16/19 1111    Subjective I saw Dr. Barry Dienes Monday but don't want to continue seeing her. I see Dr. Marla Roe tomorrow and she will determine based on how my wound looks if I need debridement. But I think it looks worse than last week by being larger and there is now a whole  where the drainage was coming out, but that has lessened.    Pertinent History Bil masectomy 09/05/19 with 3 lymph nodes removed and negative on the R and benign tisuse on the L. Patient was diagnosed on 06/09/2019 with right grade III invasive ductal carcinoma breast cancer. It mesures 1.3 cm in the upper outer quadrant with 1.6 cm of calcifications and DCIS. It is ER positive, PR negative, and HER2 positive with a Ki67 of 50%. She smokes about 1 pack/day.    Patient Stated Goals I want to get back to kayaking.    Currently in Pain? No/denies                             [facility] Adult PT Treatment/Exercise - 10/16/19 0001      Manual Therapy   Manual Therapy Passive ROM;Soft tissue mobilization    Soft tissue mobilization With coconut oil to Rt upper trap and posterior shoulder where pt reports pain at end P/ROM stretching, less pain reported with stretching after    Passive ROM In Supine: Gently to Rt side into flexion, abduction, and er, then to Lt side into flexion, abduction, and D2 to pts tolerance stablizing both incisions throughout so no pull, especially at Rt with blocking with myofascial hold  PT Short Term Goals - 09/25/19 1202      PT SHORT TERM GOAL #1   Title Pt will be independent with HEP and Modified MLD for the anterior trunk within 2 weeks in order to demonstrate autonomy of care.    Baseline pt provided with HEP today    Time 2    Period Weeks    Status New    Target Date 10/16/19             PT Long Term Goals - 09/25/19 1203      PT LONG TERM GOAL #1   Title Pt will demonstrate 150 degrees bil shoulder flexion and abduction within 4 weeks to demonstrate return to pre-operative AROM.    Baseline R shoulder flexion: 110, abduction: 69 L shoulder flexion: 118, abduction:  77    Time 4    Period Weeks    Status New    Target Date 10/30/19      PT LONG TERM GOAL #2   Title Pt will report 50% improvement in functional  mobility and pain in order to demonstrate an improvement in subjective quality of life.    Baseline 2/10 pain, decresaed ROM below shoulder height. MOdifications with bathing/dressing    Time 4    Period Weeks    Status New    Target Date 10/30/19                 Plan - 10/16/19 1242    Clinical Impression Statement Continued with gentle P/ROM to Rt shoulder, blocking pull from incision with myofascial holds during end motions. Then P/ROM to Lt as tolerated, also being mindful of not allowing pull at incision. Briefly assessed pts wound. Does not appear infected or have increased redness and no odor. She sees Dr. Marla Roe tomorrow for reassess and to determine need for surgical debridement. Pt to call when ready for return to physical therapy.    Personal Factors and Comorbidities Comorbidity 1    Comorbidities bil masectomy with 3 lymph node removal on the R    Stability/Clinical Decision Making Stable/Uncomplicated    Rehab Potential Good    PT Frequency 2x / week    PT Duration 4 weeks    PT Treatment/Interventions Therapeutic activities;Therapeutic exercise;Neuromuscular re-education;Manual techniques;Cryotherapy;Moist Heat    PT Next Visit Plan Has was appt with Dr. Dillingham?/did she have surgical debridement?; Assess HEP, re-measure ROM, increased AA/ROM and A/ROM activities, being cautious with Rt due to wound and necrotic tissue at Rt masectomy    Consulted and Agree with Plan of Care Patient           Patient will benefit from skilled therapeutic intervention in order to improve the following deficits and impairments:  Decreased knowledge of precautions, Pain, Decreased range of motion, Increased edema  Visit Diagnosis: Malignant neoplasm of upper-outer quadrant of right breast in female, estrogen receptor positive (HCC)  Stiffness of left shoulder, not elsewhere classified  Stiffness of right shoulder, not elsewhere classified  Acute pain of right  shoulder  Acute pain of left shoulder  Sternal pain  Abnormal posture     Problem List Patient Active Problem List   Diagnosis Date Noted  . Acquired absence of breast and absent nipple, bilateral 10/07/2019  . Breast wound, right, initial encounter 10/07/2019  . Breast cancer of upper-outer quadrant of right female breast ([place]) 09/05/2019  . Genetic testing 08/12/2019  . Family history of BRCA gene mutation 07/31/2019  . Family history of breast cancer   .  Family history of prostate cancer   . Family history of bladder cancer   . Malignant neoplasm of upper-outer quadrant of right breast in female, estrogen receptor positive ([place]) 07/24/2019  . Annual physical exam 03/31/2016  . Occupational bronchitis ([place]) 03/03/2015  . Hyperlipidemia 03/30/2014  . 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 Generalized osteoarthritis 02/23/2012  . Plantar fasciitis, bilateral 02/23/2012  . Vitiligo 02/23/2012  . Difficulty hearing 02/13/2012  . History of lupus 12/21/2011  . Vitamin D deficiency 10/11/2010  . Hypothyroidism 06/21/2010  . Systemic lupus erythematosus ([place]) 06/14/2010    Otelia Limes, PTA 10/16/2019, 12:51 PM  [place], [place], [address] Phone: [phone]   Fax:  [phone]  Name: Stephanie Chase MRN: [id number] Date of Birth: 10-May-1960

## 2019-10-17 ENCOUNTER — Telehealth: Payer: Self-pay | Admitting: *Deleted

## 2019-10-17 ENCOUNTER — Ambulatory Visit (INDEPENDENT_AMBULATORY_CARE_PROVIDER_SITE_OTHER): Payer: BC Managed Care – PPO | Admitting: Plastic Surgery

## 2019-10-17 ENCOUNTER — Encounter: Payer: Self-pay | Admitting: Plastic Surgery

## 2019-10-17 ENCOUNTER — Encounter: Payer: Self-pay | Admitting: *Deleted

## 2019-10-17 VITALS — BP 132/80 | HR 84 | Temp 98.2°F

## 2019-10-17 DIAGNOSIS — Z9013 Acquired absence of bilateral breasts and nipples: Secondary | ICD-10-CM

## 2019-10-17 DIAGNOSIS — S21001A Unspecified open wound of right breast, initial encounter: Secondary | ICD-10-CM | POA: Diagnosis not present

## 2019-10-17 NOTE — Progress Notes (Signed)
   Subjective:    Patient ID: Stephanie Chase, female    DOB: 10/23/1960, 59 y.o.   MRN: [id number]  The patient is a 59 year old female here for follow-up on her right breast wound.  She had bilateral mastectomies in June.  Since then she has had difficulty healing the right mastectomy incision.  She is frustrated with the delay.  She is also a smoker and not doing the greatest with protein.  We talked about that at her last visit.  I think she is doing much better now and is actually down to 10 cigarettes a day which is a marked improvement.  She overall looks better.  The wound does not look any worse but it certainly is not improving.  It does not appear to be infected.  I do not see any copious drainage in the periwound area is not red or inflamed.  It is 2 x 6 cm in length.     Review of Systems  Constitutional: Negative.   HENT: Negative.   Eyes: Negative.   Respiratory: Negative.  Negative for shortness of breath.   Cardiovascular: Negative.  Negative for leg swelling.  Gastrointestinal: Negative for abdominal distention.  Genitourinary: Negative.   Musculoskeletal: Negative.   Psychiatric/Behavioral: Negative.        Objective:   Physical Exam Vitals and nursing note reviewed.  Constitutional:      Appearance: Normal appearance.  HENT:     Head: Normocephalic and atraumatic.  Cardiovascular:     Rate and Rhythm: Normal rate.  Pulmonary:     Effort: Pulmonary effort is normal.  Abdominal:     General: Abdomen is flat. There is no distension.  Neurological:     General: No focal deficit present.     Mental Status: She is alert and oriented to person, place, and time.  Psychiatric:        Mood and Affect: Mood normal.        Behavior: Behavior normal.       Assessment & Plan:     ICD-10-CM   1. Acquired absence of breast and absent nipple, bilateral  Z90.13   2. Breast wound, right, initial encounter  S21.001A     Recommend excision of wound with primary closure  and ACell placement.  Patient is aware that this may not work and she might require a local flap or a latissimus flap.  This could also require lengthy dressing changes for secondary intention and closure.  I have encouraged her to continue with decreasing her nicotine and increasing her protein between now and surgery. Pictures were obtained of the patient and placed in the chart with the patient's or guardian's permission.

## 2019-10-17 NOTE — H&P (View-Only) (Signed)
   Subjective:    Patient ID: Stephanie Chase, female    DOB: [birth year], 59 y.o.   MRN: [id number]  The patient is a 59 year old female here for follow-up on her right breast wound.  She had bilateral mastectomies in June.  Since then she has had difficulty healing the right mastectomy incision.  She is frustrated with the delay.  She is also a smoker and not doing the greatest with protein.  We talked about that at her last visit.  I think she is doing much better now and is actually down to 10 cigarettes a day which is a marked improvement.  She overall looks better.  The wound does not look any worse but it certainly is not improving.  It does not appear to be infected.  I do not see any copious drainage in the periwound area is not red or inflamed.  It is 2 x 6 cm in length.     Review of Systems  Constitutional: Negative.   HENT: Negative.   Eyes: Negative.   Respiratory: Negative.  Negative for shortness of breath.   Cardiovascular: Negative.  Negative for leg swelling.  Gastrointestinal: Negative for abdominal distention.  Genitourinary: Negative.   Musculoskeletal: Negative.   Psychiatric/Behavioral: Negative.        Objective:   Physical Exam Vitals and nursing note reviewed.  Constitutional:      Appearance: Normal appearance.  HENT:     Head: Normocephalic and atraumatic.  Cardiovascular:     Rate and Rhythm: Normal rate.  Pulmonary:     Effort: Pulmonary effort is normal.  Abdominal:     General: Abdomen is flat. There is no distension.  Neurological:     General: No focal deficit present.     Mental Status: She is alert and oriented to person, place, and time.  Psychiatric:        Mood and Affect: Mood normal.        Behavior: Behavior normal.       Assessment & Plan:     ICD-10-CM   1. Acquired absence of breast and absent nipple, bilateral  Z90.13   2. Breast wound, right, initial encounter  S21.001A     Recommend excision of wound with primary closure  and ACell placement.  Patient is aware that this may not work and she might require a local flap or a latissimus flap.  This could also require lengthy dressing changes for secondary intention and closure.  I have encouraged her to continue with decreasing her nicotine and increasing her protein between now and surgery. Pictures were obtained of the patient and placed in the chart with the patient's or guardian's permission.

## 2019-10-17 NOTE — Telephone Encounter (Signed)
Spoke to pt after her visit with Dr. Marla Roe. Pt will undergo debridement of mastectomy wound and will need chemo to be delayed until September. Msg sent to Dr. Burr Medico. Scheduling msg sent to have port flushed next week. Denies further questions or needs at this time. Encourage pt to call with concerns. Received verbal understanding.

## 2019-10-20 ENCOUNTER — Other Ambulatory Visit: Payer: BC Managed Care – PPO

## 2019-10-20 ENCOUNTER — Ambulatory Visit: Payer: BC Managed Care – PPO

## 2019-10-20 ENCOUNTER — Other Ambulatory Visit (HOSPITAL_COMMUNITY)
Admission: RE | Admit: 2019-10-20 | Discharge: 2019-10-20 | Disposition: A | Discharge status: Home / Self Care | Payer: BC Managed Care – PPO | Source: Ambulatory Visit | Attending: Plastic Surgery | Admitting: Plastic Surgery

## 2019-10-20 ENCOUNTER — Telehealth: Payer: Self-pay | Admitting: *Deleted

## 2019-10-20 ENCOUNTER — Encounter: Payer: Self-pay | Admitting: *Deleted

## 2019-10-20 ENCOUNTER — Telehealth: Payer: Self-pay | Admitting: Hematology

## 2019-10-20 DIAGNOSIS — Z01812 Encounter for preprocedural laboratory examination: Secondary | ICD-10-CM | POA: Insufficient documentation

## 2019-10-20 DIAGNOSIS — Z20822 Contact with and (suspected) exposure to covid-19: Secondary | ICD-10-CM | POA: Insufficient documentation

## 2019-10-20 LAB — SARS CORONAVIRUS 2 (TAT 6-24 HRS): SARS Coronavirus 2: NEGATIVE

## 2019-10-20 NOTE — Telephone Encounter (Signed)
Spoke to pt, will proceed with herceptin alone on 8/2. Dr. Burr Medico notified. Confirmed appt for port flush as well. Denies further questions at this time.

## 2019-10-20 NOTE — Telephone Encounter (Signed)
Scheduled per 7/23 sch message. Pt is aware of appt time and date

## 2019-10-21 ENCOUNTER — Inpatient Hospital Stay: Payer: BC Managed Care – PPO

## 2019-10-21 ENCOUNTER — Other Ambulatory Visit: Payer: Self-pay

## 2019-10-21 ENCOUNTER — Encounter: Payer: Self-pay | Admitting: *Deleted

## 2019-10-21 ENCOUNTER — Encounter (HOSPITAL_COMMUNITY): Payer: Self-pay | Admitting: Plastic Surgery

## 2019-10-21 ENCOUNTER — Telehealth: Payer: Self-pay | Admitting: Plastic Surgery

## 2019-10-21 DIAGNOSIS — Z95828 Presence of other vascular implants and grafts: Secondary | ICD-10-CM

## 2019-10-21 DIAGNOSIS — C50411 Malignant neoplasm of upper-outer quadrant of right female breast: Secondary | ICD-10-CM | POA: Diagnosis not present

## 2019-10-21 MED ORDER — SODIUM CHLORIDE 0.9% FLUSH
10.0000 mL | INTRAVENOUS | Status: DC | PRN
  Administered 2019-10-21: 10 mL via INTRAVENOUS
  Filled 2019-10-21: qty 10

## 2019-10-21 NOTE — Telephone Encounter (Signed)
Returned call to pt re: her questions about the following: (1) if side effects of Herceptin infusion would be contraindicated with her planned surgery tomorrow  (2) recovery & time out of work post-op Pt works mostly on a computer/desk. I consulted with Dr. Marla Roe re: the infusion- & she is not aware of any contraindication with the planned surgery I also consulted with Venetia Night, [place] regarding her recovery- per Venetia Night she will most likely be out of work for 1-2 weeks & no heavy lifting for 4-6 weeks. This postop plan can be changed as needed. Pt understands the plan of care

## 2019-10-21 NOTE — Progress Notes (Signed)
Patient denies chest pain, shortness of breath, or cardiac visit. Patient reports having a pre- chemo echo. Educated on Environmental manager. Patient reports being in quarantine post COVID test.

## 2019-10-21 NOTE — Telephone Encounter (Signed)
Patient lvm stating she is having debridement sx tomorrow and herceptin infusion on Monday and wanted to double check whether it would create issues with her surgery if she had side effects from the infusion. Message was a little muffled. Please call patient to discuss. [phone]

## 2019-10-22 ENCOUNTER — Ambulatory Visit (HOSPITAL_COMMUNITY)
Admission: RE | Admit: 2019-10-22 | Discharge: 2019-10-22 | Disposition: A | Discharge status: Home / Self Care | Payer: BC Managed Care – PPO | Attending: Plastic Surgery | Admitting: Plastic Surgery

## 2019-10-22 ENCOUNTER — Encounter (HOSPITAL_COMMUNITY)
Admission: RE | Disposition: A | Discharge status: Home / Self Care | Payer: Self-pay | Source: Home / Self Care | Attending: Plastic Surgery

## 2019-10-22 ENCOUNTER — Ambulatory Visit (HOSPITAL_COMMUNITY): Payer: BC Managed Care – PPO | Admitting: Anesthesiology

## 2019-10-22 ENCOUNTER — Encounter (HOSPITAL_COMMUNITY): Payer: Self-pay | Admitting: Plastic Surgery

## 2019-10-22 ENCOUNTER — Other Ambulatory Visit: Payer: Self-pay

## 2019-10-22 DIAGNOSIS — Z9013 Acquired absence of bilateral breasts and nipples: Secondary | ICD-10-CM

## 2019-10-22 DIAGNOSIS — S21001A Unspecified open wound of right breast, initial encounter: Secondary | ICD-10-CM

## 2019-10-22 DIAGNOSIS — Y838 Other surgical procedures as the cause of abnormal reaction of the patient, or of later complication, without mention of misadventure at the time of the procedure: Secondary | ICD-10-CM | POA: Diagnosis not present

## 2019-10-22 DIAGNOSIS — F17200 Nicotine dependence, unspecified, uncomplicated: Secondary | ICD-10-CM | POA: Diagnosis not present

## 2019-10-22 DIAGNOSIS — J449 Chronic obstructive pulmonary disease, unspecified: Secondary | ICD-10-CM | POA: Insufficient documentation

## 2019-10-22 DIAGNOSIS — Z853 Personal history of malignant neoplasm of breast: Secondary | ICD-10-CM | POA: Insufficient documentation

## 2019-10-22 DIAGNOSIS — T8189XA Other complications of procedures, not elsewhere classified, initial encounter: Secondary | ICD-10-CM | POA: Insufficient documentation

## 2019-10-22 HISTORY — DX: Pneumonia, unspecified organism: J18.9

## 2019-10-22 HISTORY — DX: Gastro-esophageal reflux disease without esophagitis: K21.9

## 2019-10-22 HISTORY — DX: Malignant neoplasm of unspecified site of unspecified female breast: C50.919

## 2019-10-22 HISTORY — DX: Anemia, unspecified: D64.9

## 2019-10-22 HISTORY — DX: Discoid lupus erythematosus: L93.0

## 2019-10-22 HISTORY — PX: APPLICATION OF A-CELL OF EXTREMITY: SHX6303

## 2019-10-22 HISTORY — PX: DEBRIDEMENT AND CLOSURE WOUND: SHX5614

## 2019-10-22 LAB — BASIC METABOLIC PANEL
Anion gap: 8 (ref 5–15)
BUN: 6 mg/dL (ref 6–20)
CO2: 24 mmol/L (ref 22–32)
Calcium: 10.2 mg/dL (ref 8.9–10.3)
Chloride: 104 mmol/L (ref 98–111)
Creatinine, Ser: 0.45 mg/dL (ref 0.44–1.00)
GFR calc Af Amer: >60 mL/min (ref >60)
GFR calc non Af Amer: >60 mL/min (ref >60)
Glucose, Bld: 86 mg/dL (ref 70–99)
Potassium: 3.6 mmol/L (ref 3.5–5.1)
Sodium: 136 mmol/L (ref 135–145)

## 2019-10-22 LAB — CBC
HCT: 39.7 % (ref 36.0–46.0)
Hemoglobin: 13 g/dL (ref 12.0–15.0)
MCH: 28.2 pg (ref 26.0–34.0)
MCHC: 32.7 g/dL (ref 30.0–36.0)
MCV: 86.1 fL (ref 80.0–100.0)
Platelets: 271 10*3/uL (ref 150–400)
RBC: 4.61 MIL/uL (ref 3.87–5.11)
RDW: 12.7 % (ref 11.5–15.5)
WBC: 8.1 10*3/uL (ref 4.0–10.5)
nRBC: 0 % (ref 0.0–0.2)

## 2019-10-22 LAB — POCT PREGNANCY, URINE: Preg Test, Ur: NEGATIVE

## 2019-10-22 SURGERY — DEBRIDEMENT, WOUND, WITH CLOSURE
Anesthesia: General | Site: Breast | Laterality: Right

## 2019-10-22 MED ORDER — ACETAMINOPHEN 325 MG PO TABS: 650.0000 mg | ORAL_TABLET | ORAL | Status: DC | PRN

## 2019-10-22 MED ORDER — ACETAMINOPHEN 160 MG/5ML PO SOLN: 325.0000 mg | ORAL | Status: DC | PRN

## 2019-10-22 MED ORDER — LIDOCAINE-EPINEPHRINE 1 %-1:100000 IJ SOLN
INTRAMUSCULAR | Status: DC | PRN
  Administered 2019-10-22: 6 mL

## 2019-10-22 MED ORDER — ACETAMINOPHEN 325 MG PO TABS: 325.0000 mg | ORAL_TABLET | ORAL | Status: DC | PRN

## 2019-10-22 MED ORDER — MIDAZOLAM HCL 2 MG/2ML IJ SOLN
INTRAMUSCULAR | Status: AC
  Filled 2019-10-22: qty 2

## 2019-10-22 MED ORDER — OXYCODONE HCL 5 MG PO TABS: 5.0000 mg | ORAL_TABLET | ORAL | Status: DC | PRN

## 2019-10-22 MED ORDER — MIDAZOLAM HCL 5 MG/5ML IJ SOLN
INTRAMUSCULAR | Status: DC | PRN
  Administered 2019-10-22: 2 mg via INTRAVENOUS

## 2019-10-22 MED ORDER — ACETAMINOPHEN 650 MG RE SUPP: 650.0000 mg | RECTAL | Status: DC | PRN

## 2019-10-22 MED ORDER — ONDANSETRON HCL 4 MG/2ML IJ SOLN: 4.0000 mg | Freq: Once | INTRAMUSCULAR | Status: DC | PRN

## 2019-10-22 MED ORDER — BUPIVACAINE-EPINEPHRINE (PF) 0.25% -1:200000 IJ SOLN
INTRAMUSCULAR | Status: AC
  Filled 2019-10-22: qty 30

## 2019-10-22 MED ORDER — LACTATED RINGERS IV SOLN: INTRAVENOUS | Status: DC

## 2019-10-22 MED ORDER — FENTANYL CITRATE (PF) 100 MCG/2ML IJ SOLN: 25.0000 ug | INTRAMUSCULAR | Status: DC | PRN

## 2019-10-22 MED ORDER — PROPOFOL 10 MG/ML IV BOLUS
INTRAVENOUS | Status: DC | PRN
  Administered 2019-10-22: 160 mg via INTRAVENOUS

## 2019-10-22 MED ORDER — MEPERIDINE HCL 25 MG/ML IJ SOLN: 6.2500 mg | INTRAMUSCULAR | Status: DC | PRN

## 2019-10-22 MED ORDER — CHLORHEXIDINE GLUCONATE 0.12 % MT SOLN
15.0000 mL | Freq: Once | OROMUCOSAL | Status: AC
  Administered 2019-10-22: 15 mL via OROMUCOSAL
  Filled 2019-10-22: qty 15

## 2019-10-22 MED ORDER — FENTANYL CITRATE (PF) 250 MCG/5ML IJ SOLN
INTRAMUSCULAR | Status: AC
  Filled 2019-10-22: qty 5

## 2019-10-22 MED ORDER — LIDOCAINE 2% (20 MG/ML) 5 ML SYRINGE
INTRAMUSCULAR | Status: DC | PRN
  Administered 2019-10-22: 80 mg via INTRAVENOUS

## 2019-10-22 MED ORDER — LIDOCAINE-EPINEPHRINE 1 %-1:100000 IJ SOLN
INTRAMUSCULAR | Status: AC
  Filled 2019-10-22: qty 1

## 2019-10-22 MED ORDER — CEFAZOLIN SODIUM-DEXTROSE 2-4 GM/100ML-% IV SOLN
2.0000 g | INTRAVENOUS | Status: AC
  Administered 2019-10-22: 2 g via INTRAVENOUS
  Filled 2019-10-22: qty 100

## 2019-10-22 MED ORDER — SODIUM CHLORIDE 0.9% FLUSH: 3.0000 mL | INTRAVENOUS | Status: DC | PRN

## 2019-10-22 MED ORDER — MORPHINE SULFATE (PF) 2 MG/ML IV SOLN: 2.0000 mg | INTRAVENOUS | Status: DC | PRN

## 2019-10-22 MED ORDER — CHLORHEXIDINE GLUCONATE CLOTH 2 % EX PADS: 6.0000 | MEDICATED_PAD | Freq: Once | CUTANEOUS | Status: DC

## 2019-10-22 MED ORDER — SODIUM CHLORIDE 0.9% FLUSH: 3.0000 mL | Freq: Two times a day (BID) | INTRAVENOUS | Status: DC

## 2019-10-22 MED ORDER — OXYCODONE HCL 5 MG/5ML PO SOLN: 5.0000 mg | Freq: Once | ORAL | Status: DC | PRN

## 2019-10-22 MED ORDER — ORAL CARE MOUTH RINSE: 15.0000 mL | Freq: Once | OROMUCOSAL | Status: AC

## 2019-10-22 MED ORDER — FENTANYL CITRATE (PF) 100 MCG/2ML IJ SOLN
INTRAMUSCULAR | Status: DC | PRN
  Administered 2019-10-22: 50 ug via INTRAVENOUS

## 2019-10-22 MED ORDER — OXYCODONE HCL 5 MG PO TABS: 5.0000 mg | ORAL_TABLET | Freq: Once | ORAL | Status: DC | PRN

## 2019-10-22 MED ORDER — SODIUM CHLORIDE 0.9 % IV SOLN: 250.0000 mL | INTRAVENOUS | Status: DC | PRN

## 2019-10-22 MED ORDER — 0.9 % SODIUM CHLORIDE (POUR BTL) OPTIME
TOPICAL | Status: DC | PRN
  Administered 2019-10-22: 1000 mL

## 2019-10-22 SURGICAL SUPPLY — 76 items
BINDER BREAST 3XL (GAUZE/BANDAGES/DRESSINGS) IMPLANT
BINDER BREAST LRG (GAUZE/BANDAGES/DRESSINGS) IMPLANT
BINDER BREAST MEDIUM (GAUZE/BANDAGES/DRESSINGS) IMPLANT
BINDER BREAST XLRG (GAUZE/BANDAGES/DRESSINGS) IMPLANT
BINDER BREAST XXLRG (GAUZE/BANDAGES/DRESSINGS) IMPLANT
BLADE CLIPPER SURG (BLADE) IMPLANT
BLADE HEX COATED 2.75 (ELECTRODE) ×2 IMPLANT
BLADE SURG 10 STRL SS (BLADE) IMPLANT
BLADE SURG 15 STRL LF DISP TIS (BLADE) ×1 IMPLANT
BLADE SURG 15 STRL SS (BLADE) ×2
BNDG GAUZE ELAST 4 BULKY (GAUZE/BANDAGES/DRESSINGS) IMPLANT
CANISTER WOUND CARE 500ML ATS (WOUND CARE) ×2 IMPLANT
COVER BACK TABLE 60X90IN (DRAPES) ×2 IMPLANT
COVER MAYO STAND STRL (DRAPES) ×2 IMPLANT
COVER SURGICAL LIGHT HANDLE (MISCELLANEOUS) ×2 IMPLANT
COVER WAND RF STERILE (DRAPES) ×2 IMPLANT
DECANTER SPIKE VIAL GLASS SM (MISCELLANEOUS) IMPLANT
DERMABOND ADVANCED (GAUZE/BANDAGES/DRESSINGS)
DERMABOND ADVANCED .7 DNX12 (GAUZE/BANDAGES/DRESSINGS) IMPLANT
DRAIN CHANNEL 19F RND (DRAIN) IMPLANT
DRAPE CHEST BREAST 15X10 FENES (DRAPES) IMPLANT
DRAPE INCISE IOBAN 66X45 STRL (DRAPES) IMPLANT
DRAPE LAPAROTOMY 100X72 PEDS (DRAPES) IMPLANT
DRAPE SURG 17X23 STRL (DRAPES) IMPLANT
DRAPE U-SHAPE 76X120 STRL (DRAPES) IMPLANT
DRSG ADAPTIC 3X8 NADH LF (GAUZE/BANDAGES/DRESSINGS) IMPLANT
DRSG CUTIMED SORBACT 7X9 (GAUZE/BANDAGES/DRESSINGS) ×2 IMPLANT
DRSG EMULSION OIL 3X3 NADH (GAUZE/BANDAGES/DRESSINGS) IMPLANT
DRSG HYDROCOLLOID 4X4 (GAUZE/BANDAGES/DRESSINGS) IMPLANT
DRSG MEPILEX BORDER 4X8 (GAUZE/BANDAGES/DRESSINGS) ×2 IMPLANT
DRSG PAD ABDOMINAL 8X10 ST (GAUZE/BANDAGES/DRESSINGS) IMPLANT
DRSG TEGADERM 4X4.75 (GAUZE/BANDAGES/DRESSINGS) IMPLANT
DRSG TELFA 3X8 NADH (GAUZE/BANDAGES/DRESSINGS) IMPLANT
DRSG VAC ATS LRG SENSATRAC (GAUZE/BANDAGES/DRESSINGS) IMPLANT
DRSG VAC ATS MED SENSATRAC (GAUZE/BANDAGES/DRESSINGS) IMPLANT
DRSG VAC ATS SM SENSATRAC (GAUZE/BANDAGES/DRESSINGS) IMPLANT
ELECT CAUTERY BLADE 6.4 (BLADE) ×2 IMPLANT
ELECT REM PT RETURN 9FT ADLT (ELECTROSURGICAL) ×2
ELECTRODE REM PT RTRN 9FT ADLT (ELECTROSURGICAL) ×1 IMPLANT
EVACUATOR SILICONE 100CC (DRAIN) IMPLANT
GAUZE SPONGE 4X4 12PLY STRL (GAUZE/BANDAGES/DRESSINGS) ×2 IMPLANT
GAUZE XEROFORM 5X9 LF (GAUZE/BANDAGES/DRESSINGS) IMPLANT
GEL ULTRASOUND 20GR AQUASONIC (MISCELLANEOUS) IMPLANT
GEL WOUND SURGX STRL 7.5 (MISCELLANEOUS) ×2 IMPLANT
GLOVE BIO SURGEON STRL SZ 6.5 (GLOVE) ×4 IMPLANT
GOWN STRL REUS W/ TWL LRG LVL3 (GOWN DISPOSABLE) ×3 IMPLANT
GOWN STRL REUS W/TWL LRG LVL3 (GOWN DISPOSABLE) ×6
KIT BASIN (CUSTOM PROCEDURE TRAY) ×2 IMPLANT
KIT BASIN OR (CUSTOM PROCEDURE TRAY) ×2 IMPLANT
NEEDLE HYPO 25X1 1.5 SAFETY (NEEDLE) ×2 IMPLANT
NS IRRIG 1000ML POUR BTL (IV SOLUTION) ×2 IMPLANT
PACK GENERAL/GYN (CUSTOM PROCEDURE TRAY) ×2 IMPLANT
PENCIL SMOKE EVACUATOR (MISCELLANEOUS) ×2 IMPLANT
SHEET MEDIUM DRAPE 40X70 STRL (DRAPES) IMPLANT
SPONGE LAP 18X18 RF (DISPOSABLE) ×2 IMPLANT
STAPLER VISISTAT 35W (STAPLE) IMPLANT
STOCKINETTE IMPERVIOUS 9X36 MD (GAUZE/BANDAGES/DRESSINGS) IMPLANT
STOCKINETTE IMPERVIOUS LG (DRAPES) IMPLANT
SURGILUBE 2OZ TUBE FLIPTOP (MISCELLANEOUS) ×2 IMPLANT
SUT MNCRL AB 3-0 PS2 18 (SUTURE) ×2 IMPLANT
SUT MNCRL AB 4-0 PS2 18 (SUTURE) ×4 IMPLANT
SUT MON AB 2-0 CT1 36 (SUTURE) ×2 IMPLANT
SUT MON AB 5-0 PS2 18 (SUTURE) ×2 IMPLANT
SUT SILK 3 0 PS 1 (SUTURE) IMPLANT
SUT SILK 4 0 P 3 (SUTURE) IMPLANT
SUT VIC AB 5-0 PS2 18 (SUTURE) ×2 IMPLANT
SWAB COLLECTION DEVICE MRSA (MISCELLANEOUS) IMPLANT
SWAB CULTURE ESWAB REG 1ML (MISCELLANEOUS) IMPLANT
SYR BULB IRRIG 60ML STRL (SYRINGE) IMPLANT
SYR CONTROL 10ML LL (SYRINGE) ×2 IMPLANT
TOWEL GREEN STERILE (TOWEL DISPOSABLE) ×2 IMPLANT
TOWEL GREEN STERILE FF (TOWEL DISPOSABLE) ×4 IMPLANT
TUBE CONNECTING 12X1/4 (SUCTIONS) ×2 IMPLANT
TUBE CONNECTING 20X1/4 (TUBING) ×2 IMPLANT
UNDERPAD 30X36 HEAVY ABSORB (UNDERPADS AND DIAPERS) ×2 IMPLANT
YANKAUER SUCT BULB TIP NO VENT (SUCTIONS) ×2 IMPLANT

## 2019-10-22 NOTE — Anesthesia Postprocedure Evaluation (Signed)
Anesthesia Post Note  Patient: Stephanie Chase  Procedure(s) Performed: Excision of right breast wound (Right Breast) EXCISION OF RIGHT BREAST WOUND WITH PRIMARY CLOSURE (Right Breast)     Patient location during evaluation: PACU Anesthesia Type: General Level of consciousness: awake and alert, patient cooperative and oriented Pain management: pain level controlled Vital Signs Assessment: post-procedure vital signs reviewed and stable Respiratory status: spontaneous breathing, nonlabored ventilation and respiratory function stable Cardiovascular status: blood pressure returned to baseline and stable Postop Assessment: no apparent nausea or vomiting and adequate PO intake Anesthetic complications: no   No complications documented.  Last Vitals:  Vitals:   10/22/19 1850 10/22/19 1905  BP: (!) 130/91 (!) 138/85  Pulse: 65 55  Resp: 20 17  Temp:  (!) 36.4 C  SpO2: 100% 98%    Last Pain:  Vitals:   10/22/19 1905  TempSrc:   PainSc: 0-No pain                 Elliana Bal,E. Jaela Yepez

## 2019-10-22 NOTE — Interval H&P Note (Signed)
History and Physical Interval Note:  10/22/2019 5:44 PM  Stephanie Chase  has presented today for surgery, with the diagnosis of breast wound.  The various methods of treatment have been discussed with the patient and family. After consideration of risks, benefits and other options for treatment, the patient has consented to  Procedure(s): Excision of right breast wound (Right) APPLICATION OF A-CELL, INTEGRA BWM OR CELLERATE (Right) as a surgical intervention.  The patient's history has been reviewed, patient examined, no change in status, stable for surgery.  I have reviewed the patient's chart and labs.  Questions were answered to the patient's satisfaction.     Loel Lofty Rishaan Gunner

## 2019-10-22 NOTE — Anesthesia Preprocedure Evaluation (Signed)
Anesthesia Evaluation  Patient identified by MRN, date of birth, ID band Patient awake    Reviewed: Allergy & Precautions, NPO status , Patient's Chart, lab work & pertinent test results  Airway Mallampati: II  TM Distance: >3 FB Neck ROM: Full    Dental no notable dental hx. (+) Teeth Intact, Dental Advisory Given,    Pulmonary asthma , COPD,  COPD inhaler, Current Smoker and Patient abstained from smoking.,    Pulmonary exam normal breath sounds clear to auscultation       Cardiovascular Exercise Tolerance: Good negative cardio ROS Normal cardiovascular exam Rhythm:Regular Rate:Normal  08/04/19 Echo Left Ventricle: Left ventricular ejection fraction, by estimation, is 65  to 70%. The left ventricle has normal function. The left ventricle has no  regional wall motion abnormalities. The average left ventricular global  longitudinal strain is -20.4 %.  The left ventricular internal cavity size was normal in size. There is no  left ventricular hypertrophy. Left ventricular diastolic parameters were  normal.    Neuro/Psych Anxiety negative neurological ROS     GI/Hepatic negative GI ROS, Neg liver ROS,   Endo/Other  Hypothyroidism   Renal/GU negative Renal ROS     Musculoskeletal  (+) Arthritis ,   Abdominal   Peds  Hematology Lab Results      Component                Value               Date                      WBC                      6.6                 08/28/2019                HGB                      14.4                08/28/2019                HCT                      43.6                08/28/2019                MCV                      89.3                08/28/2019                PLT                      281                 08/28/2019             Anesthesia Other Findings Breast CA  Reproductive/Obstetrics negative OB ROS                             Anesthesia  Physical  Anesthesia Plan  ASA: II  Anesthesia Plan: General  Post-op Pain Management:    Induction: Intravenous  PONV Risk Score and Plan: Treatment may vary due to age or medical condition, Ondansetron, Dexamethasone and Midazolam  Airway Management Planned: Oral ETT and LMA  Additional Equipment: None  Intra-op Plan:   Post-operative Plan: Extubation in OR  Informed Consent: I have reviewed the patients History and Physical, chart, labs and discussed the procedure including the risks, benefits and alternatives for the proposed anesthesia with the patient or authorized representative who has indicated his/her understanding and acceptance.     Dental advisory given  Plan Discussed with: CRNA and Anesthesiologist  Anesthesia Plan Comments: (See PAT note by Karoline Caldwell, PA-C   )        Anesthesia Quick Evaluation

## 2019-10-22 NOTE — Anesthesia Procedure Notes (Signed)
Procedure Name: LMA Insertion Date/Time: 10/22/2019 5:50 PM Performed by: Moshe Salisbury, CRNA Pre-anesthesia Checklist: Patient identified, Emergency Drugs available, Suction available and Patient being monitored Patient Re-evaluated:Patient Re-evaluated prior to induction Oxygen Delivery Method: Circle System Utilized Preoxygenation: Pre-oxygenation with 100% oxygen Induction Type: IV induction Ventilation: Mask ventilation without difficulty LMA: LMA inserted LMA Size: 4.0 Number of attempts: 1 Placement Confirmation: positive ETCO2 Tube secured with: Tape Dental Injury: Teeth and Oropharynx as per pre-operative assessment

## 2019-10-22 NOTE — Op Note (Signed)
DATE OF OPERATION: 10/22/2019  LOCATION: [facility]  PREOPERATIVE DIAGNOSIS: right breast wound  POSTOPERATIVE DIAGNOSIS: Same  PROCEDURE: Excision of right breast wound 2 x 7 cm with primary layered closure  SURGEON: Claire Sanger Dillingham, DO  ASSISTANT: Phoebe Sharps, PA  EBL: 1 cc  CONDITION: Stable  COMPLICATIONS: None  INDICATION: The patient, Stephanie Chase, is a 59 y.o. female born on 1960/08/17, is here for treatment of right breast incision wound after a mastectomy.   PROCEDURE DETAILS:  The patient was seen prior to surgery and marked.  The IV antibiotics were given. The patient was taken to the operating room and given a general anesthetic. A standard time out was performed and all information was confirmed by those in the room. SCDs were placed.   The right breast was prepped and draped.  Local with epinephrine was injected for intraoperative hemostasis and postoperative pain control.  The 2 x 7 cm area was marked and a #10 blade was used to excise the wound.  Hemostasis was achieved with electrocautery.  Undermining was done for 1-2 cm to provide less tension with closure.  The central portion was closed with 2-0 Monocryl vertical mattress.  The rest was closed with 3-0 Monocryl at the deep layers followed by 4-0 Monocryl.  Surg X was applied with a sterile dressing.  The patient was allowed to wake up and taken to recovery room in stable condition at the end of the case. The family was notified at the end of the case.   The advanced practice practitioner (APP) assisted throughout the case.  The APP was essential in retraction and counter traction when needed to make the case progress smoothly.  This retraction and assistance made it possible to see the tissue plans for the procedure.  The assistance was needed for blood control, tissue re-approximation and assisted with closure of the incision site.

## 2019-10-22 NOTE — Progress Notes (Signed)
Stephanie Chase   Telephone:([phone]) [phone] Fax:([phone]   Clinic Follow up Note   Patient Care Team: Delsa Bern, MD as PCP - General (Obstetrics and Gynecology) Richrd Prime as Consulting Physician (Obstetrics and Gynecology) Mauro Kaufmann, RN as Oncology Nurse Navigator Rockwell Germany, RN as Oncology Nurse Navigator Stark Klein, MD as Consulting Physician (General Surgery) Truitt Merle, MD as Consulting Physician (Hematology) Kyung Rudd, MD as Consulting Physician (Radiation Oncology) Register, Amber, PA-C as Physician Assistant (Dermatology)  Date of Service:  10/27/2019  CHIEF COMPLAINT: F/u of right breast cancer   SUMMARY OF ONCOLOGIC HISTORY: Oncology History Overview Note  Cancer Staging Malignant neoplasm of upper-outer quadrant of right breast in female, estrogen receptor positive ([place]) Staging form: Breast, AJCC 8th Edition - Clinical stage from 07/30/2019: Stage IA (cT1c, cN0, cM0, G3, ER+, PR-, HER2+) - Unsigned    Malignant neoplasm of upper-outer quadrant of right breast in female, estrogen receptor positive ([place])  07/03/2019 Mammogram   Diagnostic Mammogram  IMPRESSION There is a 1.3x1.1cm focal asymmetry in the retroareolar anterior to middle depth right breast 2.2cm from the nipple.  There is a 2 cm focal asymmetry with appearance in the upper outer quadrant posterior depth of right breast 3 cm from nipple -Both suspicious and biopsy recommended.     07/18/2019 Initial Biopsy   Diagnosis 07/18/19 1. Breast, right, needle core biopsy, 12 o'clock - INVASIVE DUCTAL CARCINOMA. 2. Breast, right, needle core biopsy, upper outer quadrant - DUCTAL CARCINOMA IN SITU. Microscopic Comment 1. The invasive carcinoma is nuclear grade 3. The greatest linear extent of tumor in any one core is 11 mm. Ancillary studies will be reported separately. CKAE1AE3, GATA-3, ER and E-cadherin are positive. PAX 8 is negative. CK7 is noncontributory. 2. The in situ  carcinoma is high nuclear grade with central necrosis and calcifications. E-cadherin is positive. P63, Calponin and SMM-1 demonstrate the present of myoepithelium.   07/18/2019 Receptors her2   1. PROGNOSTIC INDICATORS 07/18/19 Results: IMMUNOHISTOCHEMICAL AND MORPHOMETRIC ANALYSIS PERFORMED MANUALLY The tumor cells are POSITIVE for Her2 (3+). Of note, the tumor shows heterogeneity in regards to Her2 expression. Estrogen Receptor: 95%, POSITIVE, STRONG STAINING INTENSITY Progesterone Receptor: 0%, NEGATIVE Proliferation Marker Ki67: 50%   07/24/2019 Initial Diagnosis   Malignant neoplasm of upper-outer quadrant of right breast in female, estrogen receptor positive ([place])   08/01/2019 Breast MRI   IMPRESSION: 1. Known invasive ductal carcinoma measures 1.2 centimeters in the 12 o'clock location. 2. Non mass enhancement measures 2.3 centimeters in the LATERAL portion of the RIGHT breast corresponding to DCIS on biopsy. 3. An additional mass in the UPPER OUTER QUADRANT of the RIGHT breast is 1.5 centimeters and warrants tissue diagnosis. This area correlates with distortion seen mammographically. 4. No axillary adenopathy or ancillary findings. LEFT breast is negative.     08/04/2019 Echocardiogram   IMPRESSIONS     1. Left ventricular ejection fraction, by estimation, is 65 to 70%. The  left ventricle has normal function. The left ventricle has no regional  wall motion abnormalities. Left ventricular diastolic parameters were  normal. The average left ventricular  global longitudinal strain is -20.4 %.   2. Right ventricular systolic function is normal. The right ventricular  size is normal.   3. The mitral valve is normal in structure. No evidence of mitral valve  regurgitation. No evidence of mitral stenosis.   4. The aortic valve is normal in structure. Aortic valve regurgitation is  not visualized. No aortic stenosis is present.  08/10/2019 Genetic Testing   Negative genetic  testing:  No pathogenic variants detected on the [facility] Breast Cancer STAT panel + Common Hereditary Cancers panel. A variant of uncertain significance (VUS) was detected in the ATM gene called c.3834C>A. The report date is 08/10/2019.  The Breast Cancer STAT Panel offered by [facility] includes sequencing and deletion/duplication analysis for the following 9 genes:  ATM, BRCA1, BRCA2, CDH1, CHEK2, PALB2, PTEN, STK11 and TP53. The Common Hereditary Cancers Panel offered by [facility] includes sequencing and/or deletion duplication testing of the following 48 genes: APC, ATM, AXIN2, BARD1, BMPR1A, BRCA1, BRCA2, BRIP1, CDH1, CDK4, CDKN2A (p14ARF), CDKN2A (p16INK4a), CHEK2, CTNNA1, DICER1, EPCAM (Deletion/duplication testing only), GREM1 (promoter region deletion/duplication testing only), KIT, MEN1, MLH1, MSH2, MSH3, MSH6, MUTYH, NBN, NF1, NHTL1, PALB2, PDGFRA, PMS2, POLD1, POLE, PTEN, RAD50, RAD51C, RAD51D, RNF43, SDHB, SDHC, SDHD, SMAD4, SMARCA4. STK11, TP53, TSC1, TSC2, and VHL.  The following genes were evaluated for sequence changes only: SDHA and HOXB13 c.251G>A variant only.    09/05/2019 Surgery   BILATERAL MASTECTOMY WITH RIGHT SENTINEL LYMPH NODE BIOPSY and PAC placement by Dr Barry Dienes    09/05/2019 Pathology Results   FINAL MICROSCOPIC DIAGNOSIS:   A. BREAST, LEFT, MASTECTOMY:  - Benign breast tissue.   B. BREAST, RIGHT, MASTECTOMY:  - Invasive ductal carcinoma, multifocal, 1.4 cm in greatest dimension,  [place] grade 3 of 3.  - Ductal carcinoma in situ, high nuclear grade with central necrosis and  calcifications.  - Margins of resection are not involved.  - Biopsy sites.  - See oncology table.   C. SENTINEL LYMPH NODE, RIGHT AXILLARY #1, BIOPSY:  - One lymph node, negative for carcinoma (0/1).   D. SENTINEL LYMPH NODE, RIGHT AXILLARY #2, BIOPSY:  - One lymph node, negative for carcinoma (0/1).   E. SENTINEL LYMPH NODE, RIGHT AXILLARY #3, BIOPSY:  - One lymph node, negative for  carcinoma (0/1).     Estrogen Receptor: 95%, positive, strong             Progesterone Receptor: 0%, negative             HER2: Positive (3+)             Ki-67: 50%    09/05/2019 Cancer Staging   Staging form: Breast, AJCC 8th Edition - Pathologic stage from 09/05/2019: Stage IA (pT1c, pN0, cM0, G3, ER+, PR-, HER2+) - Signed by Gardenia Phlegm, NP on 09/17/2019   10/22/2019 Surgery   Excision of right breast wound and APPLICATION OF A-CELL, INTEGRA BWM OR CELLERATE by Dr Marla Roe on 10/22/19   10/27/2019 -  Chemotherapy   Weekly Taxol and Herceptin for 12 weeks starting 10/27/19 then maintenance Herceptin q3weeks to complete 1 year treatment. Given recent right breast infection and debridement, will start Herceptin with loading dose (q2weeks) and add Taxol with week 3 then continuing with both weekly.       CURRENT THERAPY:  Weekly Taxol and Herceptin for 12 weeks starting 10/27/19 then maintenance Herceptin q3weeks to complete 1 year treatment. Given recent right breast infection and debridement, will start Herceptin with loading dose (q2weeks) and add Taxol with week 3 then continuing with both weekly.    INTERVAL HISTORY:  Stephanie Chase is here for a follow up and treatment. She presents to the clinic with her family member. She had debridement surgery at her right breast incision last week. She notes her questions about her cancer and her treatment. She notes she has reduced smoking to 5-6 cigarettes a day. She  notes night time medications cause her severe nightmares. If she takes it during the day she has drowsiness.    REVIEW OF SYSTEMS:   Constitutional: Denies fevers, chills or abnormal weight loss Eyes: Denies blurriness of vision Ears, nose, mouth, throat, and face: Denies mucositis or sore throat Respiratory: Denies cough, dyspnea or wheezes Cardiovascular: Denies palpitation, chest discomfort or lower extremity swelling Gastrointestinal:  Denies nausea, heartburn or  change in bowel habits Skin: Denies abnormal skin rashes Lymphatics: Denies new lymphadenopathy or easy bruising Neurological:Denies numbness, tingling or new weaknesses Behavioral/Psych: Mood is stable, no new changes  All other systems were reviewed with the patient and are negative.  MEDICAL HISTORY:  Past Medical History:  Diagnosis Date  . Anemia   . Anxiety   . Asthma   . Breast cancer ([place])   . COPD (chronic obstructive pulmonary disease) ([place])   . Discoid lupus   . Family history of bladder cancer   . Family history of BRCA gene mutation   . Family history of breast cancer   . Family history of prostate cancer   . GERD (gastroesophageal reflux disease)   . Hypothyroidism   . Pneumonia   . Thyroid disease     SURGICAL HISTORY: Past Surgical History:  Procedure Laterality Date  . ABLATION ON ENDOMETRIOSIS    . APPLICATION OF A-CELL OF EXTREMITY Right 10/22/2019   Procedure: EXCISION OF RIGHT BREAST WOUND WITH PRIMARY CLOSURE;  Surgeon: [name] Going, DO;  Location: [place];  Service: Plastics;  Laterality: Right;  . BREAST LUMPECTOMY    . DEBRIDEMENT AND CLOSURE WOUND Right 10/22/2019   Procedure: Excision of right breast wound;  Surgeon: [name] Going, DO;  Location: [place];  Service: Plastics;  Laterality: Right;  . MASTECTOMY W/ SENTINEL NODE BIOPSY Bilateral 09/05/2019   Procedure: BILATERAL MASTECTOMY WITH RIGHT SENTINEL LYMPH NODE BIOPSY;  Surgeon: Stark Klein, MD;  Location: [place];  Service: General;  Laterality: Bilateral;  COMBINED WITH REGIONAL FOR POST OP PAIN  . PORTACATH PLACEMENT Left 09/05/2019   Procedure: INSERTION PORT-A-CATH WITH ULTRASOUND GUIDANCE;  Surgeon: Stark Klein, MD;  Location: [place];  Service: General;  Laterality: Left;  . TONSILLECTOMY    . TUBAL LIGATION    . WISDOM TOOTH EXTRACTION      I have reviewed the social history and family history with the patient and they are unchanged from previous note.  ALLERGIES:  is  allergic to bactrim [sulfamethoxazole-trimethoprim].  MEDICATIONS:  Current Outpatient Medications  Medication Sig Dispense Refill  . acetaminophen (TYLENOL) 500 MG tablet Take 500-1,000 mg by mouth every 6 (six) hours as needed for moderate pain.     . Albuterol Sulfate (PROAIR RESPICLICK) 413 (90 Base) MCG/ACT AEPB Inhale 2 puffs into the lungs every 6 (six) hours as needed. (Patient taking differently: Inhale 2 puffs into the lungs every 6 (six) hours as needed (SOB). ) 1 each 3  . ALPRAZolam (XANAX) 0.25 MG tablet Take 1 tablet (0.25 mg total) by mouth 2 (two) times daily as needed for anxiety. (Patient taking differently: Take 0.25 mg by mouth daily as needed for anxiety. ) 20 tablet 0  . Ascorbic Acid (VITAMIN C) 1000 MG tablet Take 1,000 mg by mouth daily.    . cetirizine (ZYRTEC) 10 MG tablet Take 10 mg by mouth daily as needed for allergies.     . Cholecalciferol (VITAMIN D) 50 MCG (2000 UT) tablet Take 2,000 Units by mouth daily.    Marland Kitchen dextromethorphan (DELSYM) 30  MG/5ML liquid Take 60 mg by mouth 2 (two) times daily as needed for cough.    . fluticasone (FLONASE) 50 MCG/ACT nasal spray Place 1 spray into both nostrils daily as needed for allergies.     Marland Kitchen HYDROcodone-acetaminophen (NORCO) 5-325 MG tablet Take 1 tablet by mouth every 8 (eight) hours as needed for up to 5 days for severe pain. For use AFTER Surgery 15 tablet 0  . levothyroxine (SYNTHROID) 75 MCG tablet Take 75 mcg by mouth daily before breakfast.    . lidocaine-prilocaine (EMLA) cream Apply 1 application topically as needed. (Patient taking differently: Apply 1 application topically daily as needed (port access). ) 30 g 1  . liothyronine (CYTOMEL) 5 MCG tablet TAKE 2 TABLETS (=10MCG     TOTAL)     DAILY (Patient taking differently: Take 10 mcg by mouth daily. ) 180 tablet 1  . methocarbamol (ROBAXIN) 500 MG tablet Take 1 tablet (500 mg total) by mouth every 6 (six) hours as needed (use for muscle cramps/pain). 20 tablet 1  .  Multiple Vitamin (MULTIVITAMIN WITH MINERALS) TABS tablet Take 1 tablet by mouth 2 (two) times daily.    . nicotine (NICODERM CQ - DOSED IN MG/24 HOURS) 21 mg/24hr patch Place 21 mg onto the skin daily.    . ondansetron (ZOFRAN) 8 MG tablet Take 1 tablet (8 mg total) by mouth 2 (two) times daily as needed (Nausea or vomiting). 30 tablet 1  . OVER THE COUNTER MEDICATION Take 1 each by mouth 2 (two) times daily as needed (anxiety / sleep). CBD Gummies    . oxyCODONE (OXY IR/ROXICODONE) 5 MG immediate release tablet Take 1 tablet (5 mg total) by mouth every 6 (six) hours as needed for breakthrough pain. 20 tablet 0  . prochlorperazine (COMPAZINE) 10 MG tablet Take 1 tablet (10 mg total) by mouth every 6 (six) hours as needed (Nausea or vomiting). 30 tablet 1  . Zinc Sulfate 220 (50 Zn) MG TABS Take 1 tablet (220 mg total) by mouth in the morning and at bedtime. 60 tablet 0   No current facility-administered medications for this visit.    PHYSICAL EXAMINATION: ECOG PERFORMANCE STATUS: 1 - Symptomatic but completely ambulatory  Vitals:   10/27/19 0845  BP: (!) 133/91  Pulse: 86  Resp: 20  Temp: 98.1 F (36.7 C)  SpO2: 100%   Filed Weights   10/27/19 0845  Weight: 155 lb 4.8 oz (70.4 kg)    GENERAL:alert, no distress and comfortable SKIN: skin color, texture, turgor are normal, no rashes or significant lesions EYES: normal, Conjunctiva are pink and non-injected, sclera clear  NECK: supple, thyroid normal size, non-tender, without nodularity LYMPH:  no palpable lymphadenopathy in the cervical, axillary  LUNGS: clear to auscultation and percussion with normal breathing effort HEART: regular rate & rhythm and no murmurs and no lower extremity edema ABDOMEN:abdomen soft, non-tender and normal bowel sounds Musculoskeletal:no cyanosis of digits and no clubbing  NEURO: alert & oriented x 3 with fluent speech, no focal motor/sensory deficits BREAST: S/p B/l Mastectomy: right breast incision  healing well with mild skin erythema.   LABORATORY DATA:  I have reviewed the data as listed CBC Latest Ref Rng & Units 10/27/2019 10/22/2019 10/02/2019  WBC 4.0 - 10.5 K/uL 9.7 8.1 8.3  Hemoglobin 12.0 - 15.0 g/dL 14.2 13.0 13.6  Hematocrit 36 - 46 % 42.4 39.7 41.6  Platelets 150 - 400 K/uL 279 271 267     CMP Latest Ref Rng & Units 10/27/2019  10/22/2019 10/02/2019  Glucose 70 - 99 mg/dL 82 86 85  BUN 6 - 20 mg/dL '13 6 7  ' Creatinine 0.44 - 1.00 mg/dL 0.64 0.45 0.72  Sodium 135 - 145 mmol/L 137 136 139  Potassium 3.5 - 5.1 mmol/L 4.1 3.6 4.2  Chloride 98 - 111 mmol/L 107 104 104  CO2 22 - 32 mmol/L '23 24 26  ' Calcium 8.9 - 10.3 mg/dL 11.2(H) 10.2 10.2  Total Protein 6.5 - 8.1 g/dL 7.4 - 7.4  Total Bilirubin 0.3 - 1.2 mg/dL 0.2(L) - 0.2(L)  Alkaline Phos 38 - 126 U/L 88 - 118  AST 15 - 41 U/L 21 - 26  ALT 0 - 44 U/L 14 - 29      RADIOGRAPHIC STUDIES: I have personally reviewed the radiological images as listed and agreed with the findings in the report. No results found.   ASSESSMENT & PLAN:  Deyonna Fitzsimmons is a 59 y.o. female with    1.Malignant neoplasm of upper-outer quadrant of right breast,invasive ductal carcinoma and DCIS,StageIA, pT1cN0M0,ER+/HER2+, PR-,Grade III -She was diagnosed in 06/2019. She was found to have 2 foci in right breast,1.6cmDCIS in UOQ of and the 1.3cminvasive ductal carcinoma at 12:00 position. Left breast negative on Breast MRI.  -Given multifocal disease, she underwent right mastectomy and SLNB by Dr Barry Dienes on 09/05/19. 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 Her margins were clear and she was node negative. Her left breast tissue was benign.  -Given her HER2 positive high grade disease, adjuvant chemotherapy is recommended to reduce her high risk of recurrence. I recommend Weekly paclitaxel and trastuzumab for 12 weeks, followed by maintenance  trastuzumab q3weeks to complete 1 year treatment. The goal of chemo is curative.  -She had PAC placed with surgery.  -Given recent necrotic tissue at right mastectomy site she underwent debridement last week.  It may take a few weeks to heal completely.  I will hold paclitaxel for now.  I will start her with Loading dose trastuzumab (q2weeks) and add Taxol in 2 weeks, then continue both weekly for 12 weeks.   -Labs reviewed and adequate to proceed with start of trastuzumab today. Per request will change pre-med Benadryl to Claritin -I answered all her questions about her cancer and her treatment to her understanding and satisfaction.  -F/u in 2 weeks    2. Genetic Testing negative for pathogenetic mutations  3. Smoking Cessation, COPD -She has been smoking since the 2nd grade. She has been able to cut down to 1/2ppd recently. She notes she is willing to further reduce smoking. I discussed smoking risk and smoking cessation with her. -Per pt at some point she may was diagnosed with COPD or Emphysema. She is on albuterol inhaler and Singulair.  -She has reduced smoking to 5-6 cigarettes a day. I encouraged her to continue to wean down until she stops.   4. Comorbidities:DiscoidLupus, Vitiligo, Thyroid disease.  -She was diagnosed with Lupus 10 years ago. She uses steroid cream as needed for her lupus rashes, some skin lesions of which have been removed. -She sees her dermatologist Dr Nevada Crane and their [place].  -She is on 2 thyroid medications.   5. Social Support, Anxiety  -She lives alone but has good support from her 2 daughters but they live out of town. She notes it is fine to have both her daughter be aware of her medical condition and are  the first to contact. She has a boyfriend Harrie Jeans who would be later to contact.  -She is considering using short or long term disability during chemo so she can be off work.  -Her daughter notes the patient has anxiety or panic attacks when  she gets overwhelmed or disappointed. -Given initial anxiety I previously started her on low dose Xanax PRN (07/30/19). She can take Xanax before infusions. I noted to watch for drowsiness.  -I previously offered her the chance to speak with out SW, she declined.  6. Right mastectomy wound  -She had been healing well from bilateral mastectomy on 09/05/19 per Dr. Barry Dienes until she developed yellow tissue and mild drainage on 09/27/19 as pictured on previous note.  -Seen at urgent care on 7/3 and was treated for cellulitis with Bactrim for 2 days later developed skin rash reaction from Bactrim. Bactrim was stopped and rash resolved. No further treatment indicated.  -Her 10/02/19 exam showed small area of necrosis to the central incision, no signs of infection. She underwent Excision of right breast wound on 10/22/19 with Dr Marla Roe.  -She is recovering well. Her wound is healing well on exam today (10/27/19)   PLAN: -Labs reviewed and adequate to proceed with start of trastuzumab with a loading dose of 6 mg/kg today  -Lab, flush, and Taxol and trastuzumab in 2 weeks then weekly X7 -F/u in 2, 3,and 5 weeks.    No problem-specific Assessment & Plan notes found for this encounter.   No orders of the defined types were placed in this encounter.  Patient and her husband had a many questions, particularly about her risk of recurrence, and rationale for her adjuvant treatment.  All questions were answered. The patient knows to call the clinic with any problems, questions or concerns. No barriers to learning was detected. The total time spent in the appointment was 40 minutes.     Truitt Merle, MD 10/27/2019   I, Joslyn Devon, am acting as scribe for Truitt Merle, MD.   I have reviewed the above documentation for accuracy and completeness, and I agree with the above.

## 2019-10-22 NOTE — Transfer of Care (Signed)
Immediate Anesthesia Transfer of Care Note  Patient: Stephanie Chase  Procedure(s) Performed: Excision of right breast wound (Right Breast) EXCISION OF RIGHT BREAST WOUND WITH PRIMARY CLOSURE (Right Breast)  Patient Location: PACU  Anesthesia Type:General  Level of Consciousness: awake and patient cooperative  Airway & Oxygen Therapy: Patient Spontanous Breathing and Patient connected to nasal cannula oxygen  Post-op Assessment: Report given to RN, Post -op Vital signs reviewed and stable and Patient moving all extremities  Post vital signs: Reviewed and stable  Last Vitals:  Vitals Value Taken Time  BP 135/82 10/22/19 1836  Temp    Pulse 65 10/22/19 1836  Resp 16 10/22/19 1836  SpO2 100 % 10/22/19 1836  Vitals shown include unvalidated device data.  Last Pain:  Vitals:   10/22/19 1446  TempSrc:   PainSc: 2       Patients Stated Pain Goal: 4 (48/54/62 7035)  Complications: No complications documented.

## 2019-10-23 ENCOUNTER — Encounter (HOSPITAL_COMMUNITY): Payer: Self-pay | Admitting: Plastic Surgery

## 2019-10-23 ENCOUNTER — Other Ambulatory Visit (INDEPENDENT_AMBULATORY_CARE_PROVIDER_SITE_OTHER): Payer: BC Managed Care – PPO | Admitting: Plastic Surgery

## 2019-10-23 ENCOUNTER — Encounter: Payer: BC Managed Care – PPO | Admitting: Rehabilitation

## 2019-10-23 DIAGNOSIS — S21001A Unspecified open wound of right breast, initial encounter: Secondary | ICD-10-CM

## 2019-10-23 MED ORDER — CEPHALEXIN 500 MG PO CAPS
500.0000 mg | ORAL_CAPSULE | Freq: Four times a day (QID) | ORAL | 0 refills | Status: AC
Start: 2019-10-23 — End: 2019-10-26

## 2019-10-23 MED ORDER — HYDROCODONE-ACETAMINOPHEN 5-325 MG PO TABS: 1.0000 | ORAL_TABLET | Freq: Three times a day (TID) | ORAL | 0 refills | Status: AC | PRN

## 2019-10-23 NOTE — Progress Notes (Signed)
Sent in post op medications

## 2019-10-24 ENCOUNTER — Telehealth: Payer: Self-pay | Admitting: *Deleted

## 2019-10-24 ENCOUNTER — Telehealth: Payer: Self-pay | Admitting: Plastic Surgery

## 2019-10-24 MED FILL — Dexamethasone Sodium Phosphate Inj 100 MG/10ML: INTRAMUSCULAR | Qty: 2 | Status: AC

## 2019-10-24 NOTE — Telephone Encounter (Signed)
Patient lvm saying her discharge papers mentioned an antibiotic which is not at her pharmacy. Please call her to advise what she should do.

## 2019-10-24 NOTE — Telephone Encounter (Signed)
Pt left a message stating she is concerned about starting  chemo on Monday after having debridement on Wednesday. She has been reviewing the side effects of chemo. Please advise

## 2019-10-24 NOTE — Telephone Encounter (Signed)
Notified of message below

## 2019-10-24 NOTE — Telephone Encounter (Signed)
Called patient, LMVM her prescription for antibiotic and pain medication was sent to [facility] on [address]. If this is the wrong pharmacy to please call back.

## 2019-10-24 NOTE — Telephone Encounter (Signed)
We will only give Transtuzumab on Monday, not chemo, so it will be OK with her wound issue. Dawn has discussed with her.   Tammi, please give her a call and let her know. As long as she feels well overall, please encourage her to keep appointments on Monday. Thanks   Truitt Merle MD

## 2019-10-27 ENCOUNTER — Encounter: Payer: Self-pay | Admitting: *Deleted

## 2019-10-27 ENCOUNTER — Ambulatory Visit: Payer: BC Managed Care – PPO | Admitting: Hematology

## 2019-10-27 ENCOUNTER — Ambulatory Visit: Payer: BC Managed Care – PPO

## 2019-10-27 ENCOUNTER — Encounter: Payer: Self-pay | Admitting: Hematology

## 2019-10-27 ENCOUNTER — Inpatient Hospital Stay: Payer: BC Managed Care – PPO

## 2019-10-27 ENCOUNTER — Inpatient Hospital Stay (HOSPITAL_BASED_OUTPATIENT_CLINIC_OR_DEPARTMENT_OTHER): Payer: BC Managed Care – PPO | Admitting: Hematology

## 2019-10-27 ENCOUNTER — Other Ambulatory Visit: Payer: BC Managed Care – PPO

## 2019-10-27 ENCOUNTER — Inpatient Hospital Stay: Payer: BC Managed Care – PPO | Attending: Hematology

## 2019-10-27 ENCOUNTER — Other Ambulatory Visit: Payer: Self-pay

## 2019-10-27 VITALS — BP 133/91 | HR 86 | Temp 98.1°F | Resp 20 | Ht 65.0 in | Wt 155.3 lb

## 2019-10-27 DIAGNOSIS — C50411 Malignant neoplasm of upper-outer quadrant of right female breast: Secondary | ICD-10-CM

## 2019-10-27 DIAGNOSIS — J449 Chronic obstructive pulmonary disease, unspecified: Secondary | ICD-10-CM | POA: Insufficient documentation

## 2019-10-27 DIAGNOSIS — Z170 Estrogen receptor positive status [ER+]: Secondary | ICD-10-CM

## 2019-10-27 DIAGNOSIS — F419 Anxiety disorder, unspecified: Secondary | ICD-10-CM | POA: Diagnosis not present

## 2019-10-27 DIAGNOSIS — F17210 Nicotine dependence, cigarettes, uncomplicated: Secondary | ICD-10-CM | POA: Diagnosis not present

## 2019-10-27 DIAGNOSIS — L80 Vitiligo: Secondary | ICD-10-CM | POA: Insufficient documentation

## 2019-10-27 DIAGNOSIS — Z95828 Presence of other vascular implants and grafts: Secondary | ICD-10-CM

## 2019-10-27 DIAGNOSIS — M329 Systemic lupus erythematosus, unspecified: Secondary | ICD-10-CM | POA: Diagnosis not present

## 2019-10-27 DIAGNOSIS — E039 Hypothyroidism, unspecified: Secondary | ICD-10-CM | POA: Diagnosis not present

## 2019-10-27 DIAGNOSIS — Z5112 Encounter for antineoplastic immunotherapy: Secondary | ICD-10-CM | POA: Diagnosis present

## 2019-10-27 DIAGNOSIS — Z5111 Encounter for antineoplastic chemotherapy: Secondary | ICD-10-CM | POA: Diagnosis present

## 2019-10-27 LAB — CBC WITH DIFFERENTIAL (CANCER CENTER ONLY)
Abs Immature Granulocytes: 0.03 10*3/uL (ref 0.00–0.07)
Basophils Absolute: 0.1 10*3/uL (ref 0.0–0.1)
Basophils Relative: 1 %
Eosinophils Absolute: 0 10*3/uL (ref 0.0–0.5)
Eosinophils Relative: 0 %
HCT: 42.4 % (ref 36.0–46.0)
Hemoglobin: 14.2 g/dL (ref 12.0–15.0)
Immature Granulocytes: 0 %
Lymphocytes Relative: 19 %
Lymphs Abs: 1.9 10*3/uL (ref 0.7–4.0)
MCH: 29.2 pg (ref 26.0–34.0)
MCHC: 33.5 g/dL (ref 30.0–36.0)
MCV: 87.1 fL (ref 80.0–100.0)
Monocytes Absolute: 0.7 10*3/uL (ref 0.1–1.0)
Monocytes Relative: 7 %
Neutro Abs: 7 10*3/uL (ref 1.7–7.7)
Neutrophils Relative %: 73 %
Platelet Count: 279 10*3/uL (ref 150–400)
RBC: 4.87 MIL/uL (ref 3.87–5.11)
RDW: 13 % (ref 11.5–15.5)
WBC Count: 9.7 10*3/uL (ref 4.0–10.5)
nRBC: 0 % (ref 0.0–0.2)

## 2019-10-27 LAB — CMP (CANCER CENTER ONLY)
ALT: 14 U/L (ref 0–44)
AST: 21 U/L (ref 15–41)
Albumin: 3.9 g/dL (ref 3.5–5.0)
Alkaline Phosphatase: 88 U/L (ref 38–126)
Anion gap: 7 (ref 5–15)
BUN: 13 mg/dL (ref 6–20)
CO2: 23 mmol/L (ref 22–32)
Calcium: 11.2 mg/dL — ABNORMAL HIGH (ref 8.9–10.3)
Chloride: 107 mmol/L (ref 98–111)
Creatinine: 0.64 mg/dL (ref 0.44–1.00)
GFR, Est AFR Am: >60 mL/min (ref >60)
GFR, Estimated: >60 mL/min (ref >60)
Glucose, Bld: 82 mg/dL (ref 70–99)
Potassium: 4.1 mmol/L (ref 3.5–5.1)
Sodium: 137 mmol/L (ref 135–145)
Total Bilirubin: 0.2 mg/dL — ABNORMAL LOW (ref 0.3–1.2)
Total Protein: 7.4 g/dL (ref 6.5–8.1)

## 2019-10-27 MED ORDER — ACETAMINOPHEN 325 MG PO TABS
ORAL_TABLET | ORAL | Status: AC
  Filled 2019-10-27: qty 2

## 2019-10-27 MED ORDER — TRASTUZUMAB-ANNS CHEMO 150 MG IV SOLR
450.0000 mg | Freq: Once | INTRAVENOUS | Status: AC
  Administered 2019-10-27: 450 mg via INTRAVENOUS
  Filled 2019-10-27: qty 21.43

## 2019-10-27 MED ORDER — LORATADINE 10 MG PO TABS
10.0000 mg | ORAL_TABLET | Freq: Every day | ORAL | Status: DC
  Administered 2019-10-27: 10 mg via ORAL

## 2019-10-27 MED ORDER — SODIUM CHLORIDE 0.9 % IV SOLN
Freq: Once | INTRAVENOUS | Status: AC
  Filled 2019-10-27: qty 250

## 2019-10-27 MED ORDER — LORATADINE 10 MG PO TABS
ORAL_TABLET | ORAL | Status: AC
  Filled 2019-10-27: qty 1

## 2019-10-27 MED ORDER — SODIUM CHLORIDE 0.9% FLUSH
10.0000 mL | INTRAVENOUS | Status: DC | PRN
  Administered 2019-10-27: 10 mL via INTRAVENOUS
  Filled 2019-10-27: qty 10

## 2019-10-27 MED ORDER — ACETAMINOPHEN 325 MG PO TABS
650.0000 mg | ORAL_TABLET | Freq: Once | ORAL | Status: AC
  Administered 2019-10-27: 650 mg via ORAL

## 2019-10-27 MED ORDER — SODIUM CHLORIDE 0.9% FLUSH
10.0000 mL | INTRAVENOUS | Status: DC | PRN
  Administered 2019-10-27: 10 mL
  Filled 2019-10-27: qty 10

## 2019-10-27 MED ORDER — HEPARIN SOD (PORK) LOCK FLUSH 100 UNIT/ML IV SOLN
500.0000 [IU] | Freq: Once | INTRAVENOUS | Status: AC | PRN
  Administered 2019-10-27: 500 [IU]
  Filled 2019-10-27: qty 5

## 2019-10-27 NOTE — Patient Instructions (Addendum)
[facility] Discharge Instructions for Patients Receiving Chemotherapy  Today you received the following chemotherapy agents: trastuzumab.  To help prevent nausea and vomiting after your treatment, we encourage you to take your nausea medication as directed.   If you develop nausea and vomiting that is not controlled by your nausea medication, call the clinic.   BELOW ARE SYMPTOMS THAT SHOULD BE REPORTED IMMEDIATELY:  *FEVER GREATER THAN 100.5 F  *CHILLS WITH OR WITHOUT FEVER  NAUSEA AND VOMITING THAT IS NOT CONTROLLED WITH YOUR NAUSEA MEDICATION  *UNUSUAL SHORTNESS OF BREATH  *UNUSUAL BRUISING OR BLEEDING  TENDERNESS IN MOUTH AND THROAT WITH OR WITHOUT PRESENCE OF ULCERS  *URINARY PROBLEMS  *BOWEL PROBLEMS  UNUSUAL RASH Items with * indicate a potential emergency and should be followed up as soon as possible.  Feel free to call the clinic should you have any questions or concerns. The clinic phone number is [phone].  Please show the CHEMO ALERT CARD at check-in to the Emergency Department and triage nurse.  Trastuzumab injection for infusion What is this medicine? TRASTUZUMAB (tras TOO zoo mab) is a monoclonal antibody. It is used to treat breast cancer and stomach cancer. This medicine may be used for other purposes; ask your health care provider or pharmacist if you have questions. COMMON BRAND NAME(S): Herceptin, Herzuma, KANJINTI, Ogivri, Ontruzant, Trazimera What should I tell my health care provider before I take this medicine? They need to know if you have any of these conditions:  heart disease  heart failure  lung or breathing disease, like asthma  an unusual or allergic reaction to trastuzumab, benzyl alcohol, or other medications, foods, dyes, or preservatives  pregnant or trying to get pregnant  breast-feeding How should I use this medicine? This drug is given as an infusion into a vein. It is administered in a hospital or clinic  by a specially trained health care professional. Talk to your pediatrician regarding the use of this medicine in children. This medicine is not approved for use in children. Overdosage: If you think you have taken too much of this medicine contact a poison control center or emergency room at once. NOTE: This medicine is only for you. Do not share this medicine with others. What if I miss a dose? It is important not to miss a dose. Call your doctor or health care professional if you are unable to keep an appointment. What may interact with this medicine? This medicine may interact with the following medications:  certain types of chemotherapy, such as daunorubicin, doxorubicin, epirubicin, and idarubicin This list may not describe all possible interactions. Give your health care provider a list of all the medicines, herbs, non-prescription drugs, or dietary supplements you use. Also tell them if you smoke, drink alcohol, or use illegal drugs. Some items may interact with your medicine. What should I watch for while using this medicine? Visit your doctor for checks on your progress. Report any side effects. Continue your course of treatment even though you feel ill unless your doctor tells you to stop. Call your doctor or health care professional for advice if you get a fever, chills or sore throat, or other symptoms of a cold or flu. Do not treat yourself. Try to avoid being around people who are sick. You may experience fever, chills and shaking during your first infusion. These effects are usually mild and can be treated with other medicines. Report any side effects during the infusion to your health care professional. Fever and chills   usually do not happen with later infusions. Do not become pregnant while taking this medicine or for 7 months after stopping it. Women should inform their doctor if they wish to become pregnant or think they might be pregnant. Women of child-bearing potential will need  to have a negative pregnancy test before starting this medicine. There is a potential for serious side effects to an unborn child. Talk to your health care professional or pharmacist for more information. Do not breast-feed an infant while taking this medicine or for 7 months after stopping it. Women must use effective birth control with this medicine. What side effects may I notice from receiving this medicine? Side effects that you should report to your doctor or health care professional as soon as possible:  allergic reactions like skin rash, itching or hives, swelling of the face, lips, or tongue  chest pain or palpitations  cough  dizziness  feeling faint or lightheaded, falls  fever  general ill feeling or flu-like symptoms  signs of worsening heart failure like breathing problems; swelling in your legs and feet  unusually weak or tired Side effects that usually do not require medical attention (report to your doctor or health care professional if they continue or are bothersome):  bone pain  changes in taste  diarrhea  joint pain  nausea/vomiting  weight loss This list may not describe all possible side effects. Call your doctor for medical advice about side effects. You may report side effects to FDA at 1-800-FDA-1088. Where should I keep my medicine? This drug is given in a hospital or clinic and will not be stored at home. NOTE: This sheet is a summary. It may not cover all possible information. If you have questions about this medicine, talk to your doctor, pharmacist, or health care provider.  2020 Elsevier/Gold Standard (2016-03-07 14:37:52)   [facility] Discharge Instructions for Patients Receiving Chemotherapy  Today you received the following chemotherapy agents: trastuzumab.  To help prevent nausea and vomiting after your treatment, we encourage you to take your nausea medication as directed.   If you develop nausea and vomiting that  is not controlled by your nausea medication, call the clinic.   BELOW ARE SYMPTOMS THAT SHOULD BE REPORTED IMMEDIATELY:  *FEVER GREATER THAN 100.5 F  *CHILLS WITH OR WITHOUT FEVER  NAUSEA AND VOMITING THAT IS NOT CONTROLLED WITH YOUR NAUSEA MEDICATION  *UNUSUAL SHORTNESS OF BREATH  *UNUSUAL BRUISING OR BLEEDING  TENDERNESS IN MOUTH AND THROAT WITH OR WITHOUT PRESENCE OF ULCERS  *URINARY PROBLEMS  *BOWEL PROBLEMS  UNUSUAL RASH Items with * indicate a potential emergency and should be followed up as soon as possible.  Feel free to call the clinic should you have any questions or concerns. The clinic phone number is [phone].  Please show the [facility] at check-in to the Emergency Department and triage nurse.  Trastuzumab injection for infusion What is this medicine? TRASTUZUMAB (tras TOO zoo mab) is a monoclonal antibody. It is used to treat breast cancer and stomach cancer. This medicine may be used for other purposes; ask your health care provider or pharmacist if you have questions. COMMON BRAND NAME(S): Herceptin, Galvin Proffer, Trazimera What should I tell my health care provider before I take this medicine? They need to know if you have any of these conditions:  heart disease  heart failure  lung or breathing disease, like asthma  an unusual or allergic reaction to trastuzumab, benzyl alcohol, or other medications,  foods, dyes, or preservatives  pregnant or trying to get pregnant  breast-feeding How should I use this medicine? This drug is given as an infusion into a vein. It is administered in a hospital or clinic by a specially trained health care professional. Talk to your pediatrician regarding the use of this medicine in children. This medicine is not approved for use in children. Overdosage: If you think you have taken too much of this medicine contact a poison control center or emergency room at once. NOTE: This medicine  is only for you. Do not share this medicine with others. What if I miss a dose? It is important not to miss a dose. Call your doctor or health care professional if you are unable to keep an appointment. What may interact with this medicine? This medicine may interact with the following medications:  certain types of chemotherapy, such as daunorubicin, doxorubicin, epirubicin, and idarubicin This list may not describe all possible interactions. Give your health care provider a list of all the medicines, herbs, non-prescription drugs, or dietary supplements you use. Also tell them if you smoke, drink alcohol, or use illegal drugs. Some items may interact with your medicine. What should I watch for while using this medicine? Visit your doctor for checks on your progress. Report any side effects. Continue your course of treatment even though you feel ill unless your doctor tells you to stop. Call your doctor or health care professional for advice if you get a fever, chills or sore throat, or other symptoms of a cold or flu. Do not treat yourself. Try to avoid being around people who are sick. You may experience fever, chills and shaking during your first infusion. These effects are usually mild and can be treated with other medicines. Report any side effects during the infusion to your health care professional. Fever and chills usually do not happen with later infusions. Do not become pregnant while taking this medicine or for 7 months after stopping it. Women should inform their doctor if they wish to become pregnant or think they might be pregnant. Women of child-bearing potential will need to have a negative pregnancy test before starting this medicine. There is a potential for serious side effects to an unborn child. Talk to your health care professional or pharmacist for more information. Do not breast-feed an infant while taking this medicine or for 7 months after stopping it. Women must use effective  birth control with this medicine. What side effects may I notice from receiving this medicine? Side effects that you should report to your doctor or health care professional as soon as possible:  allergic reactions like skin rash, itching or hives, swelling of the face, lips, or tongue  chest pain or palpitations  cough  dizziness  feeling faint or lightheaded, falls  fever  general ill feeling or flu-like symptoms  signs of worsening heart failure like breathing problems; swelling in your legs and feet  unusually weak or tired Side effects that usually do not require medical attention (report to your doctor or health care professional if they continue or are bothersome):  bone pain  changes in taste  diarrhea  joint pain  nausea/vomiting  weight loss This list may not describe all possible side effects. Call your doctor for medical advice about side effects. You may report side effects to FDA at 1-800-FDA-1088. Where should I keep my medicine? This drug is given in a hospital or clinic and will not be stored at home. NOTE: This  sheet is a summary. It may not cover all possible information. If you have questions about this medicine, talk to your doctor, pharmacist, or health care provider.  2020 Elsevier/Gold Standard (2016-03-07 14:37:52)

## 2019-10-28 ENCOUNTER — Telehealth: Payer: Self-pay | Admitting: *Deleted

## 2019-10-28 ENCOUNTER — Telehealth: Payer: Self-pay | Admitting: Hematology

## 2019-10-28 NOTE — Telephone Encounter (Signed)
Scheduled per 8/2 los. Pt is aware of appt time and dates. Noted to give pt appt calendar on next visit.

## 2019-10-30 ENCOUNTER — Telehealth: Payer: Self-pay

## 2019-10-30 NOTE — Telephone Encounter (Signed)
Ms Tallman called stating she had treatment on 8/2 and yesterday and today she is feeling very fatigued.  I reassured her and let her know this is seen after traztusumab infusions.  She verbalized understating.

## 2019-10-31 ENCOUNTER — Encounter: Payer: Self-pay | Admitting: Radiology

## 2019-10-31 ENCOUNTER — Other Ambulatory Visit: Payer: Self-pay

## 2019-10-31 ENCOUNTER — Ambulatory Visit (INDEPENDENT_AMBULATORY_CARE_PROVIDER_SITE_OTHER): Payer: BC Managed Care – PPO | Admitting: Plastic Surgery

## 2019-10-31 ENCOUNTER — Encounter: Payer: Self-pay | Admitting: Plastic Surgery

## 2019-10-31 VITALS — BP 133/86 | HR 75 | Temp 98.2°F

## 2019-10-31 DIAGNOSIS — Z170 Estrogen receptor positive status [ER+]: Secondary | ICD-10-CM

## 2019-10-31 DIAGNOSIS — S21001A Unspecified open wound of right breast, initial encounter: Secondary | ICD-10-CM

## 2019-10-31 DIAGNOSIS — C50411 Malignant neoplasm of upper-outer quadrant of right female breast: Secondary | ICD-10-CM

## 2019-10-31 NOTE — Research (Signed)
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  10/31/19 8:15AM  REGISTRATION: Registered patient into [employer] and patient was given PID [id number].   PLAN: Patient plans to start first Taxol on Monday, 8/16. Will coordinate when to get first Taxane blood draw. Plan to do baseline assessments prior to first infusion and after visit with Cira Rue, NP at 10:15AM.   Carol Ada RT(R)(T) Clinical Research Coordinator

## 2019-10-31 NOTE — Progress Notes (Signed)
The patient is a 59 year old female here for follow-up after undergoing excision of a right breast wound.  She has been using the [place] on the area.  She has been keeping it covered.  She has not started back with physical therapy on the right side yet.  She is trying to work on her nicotine intake and is down to less than a half a pack per day.  The area is closed and no sign of infection or seroma.  The redness is markedly improved.  There is bruising as expected.  She can shower and get the area wet but I recommend keeping it covered.  She can start back with light physical therapy but should keep her arm at 90 degrees for another week.  I had like to see her back in 2 to 3 weeks.  A picture was taken with the patient's permission and put in her chart.

## 2019-11-03 ENCOUNTER — Other Ambulatory Visit: Payer: Self-pay | Admitting: Plastic Surgery

## 2019-11-03 ENCOUNTER — Telehealth: Payer: Self-pay | Admitting: Plastic Surgery

## 2019-11-03 ENCOUNTER — Encounter: Payer: Self-pay | Admitting: *Deleted

## 2019-11-03 ENCOUNTER — Encounter: Payer: BC Managed Care – PPO | Admitting: Physical Therapy

## 2019-11-03 NOTE — Telephone Encounter (Signed)
Patient called to see if we could extend her disability to 11/18/2019 which is her next follow up appt. This would allow her to also get through first 2 rounds of chemo meds. She said Dr. Marla Roe told her to let the office know. Please call patient to advise if leave can be extended.

## 2019-11-04 ENCOUNTER — Telehealth: Payer: Self-pay | Admitting: *Deleted

## 2019-11-04 ENCOUNTER — Telehealth: Payer: Self-pay | Admitting: Plastic Surgery

## 2019-11-04 NOTE — Telephone Encounter (Signed)
Patient Stephanie Chase because she was not sure whether Dr. Marla Roe was taking her out of work until her follow up appt or if she should go until then. Please call patient back to advise. [place]

## 2019-11-04 NOTE — Telephone Encounter (Signed)
"  Bobbye Morton [phone]).  I need help.  Need a 1.) letter to return to work, 2.) short term disability form completed and 3.) change from continuous leave to intermittent.         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  Often at a computer, carrying things around or meeting people.  Able to work from home except must drive to work to print and scan.       Currently very fatigued from Herceptin treatment received 10/27/2019.  Next Monday I am to receive Taxol with Herceptin.  Feel able to return to work tomorrow.  There is a light work load.       Spoke with Dr. Eusebio Friendly office today.  Will not write a return to work (RTW) letter, stating Dr. Burr Medico took me out of work for surgery.         Short Term Disability (STD) expired yesterday.  Is Dr. Burr Medico also able to take over my STD using today's date as the start day?        Letter should read I am able to return with or without restrictions or limitations or specify both if any apply for this job.    Thanks."

## 2019-11-04 NOTE — Telephone Encounter (Signed)
Rx approved per Dr. Dillingham.//AB/CMA

## 2019-11-04 NOTE — Telephone Encounter (Signed)
Spoke with patient.  She needs to speak with her oncologist to determine if she needs to be out of work longer for these treatments.

## 2019-11-04 NOTE — Telephone Encounter (Signed)
"  Stephanie Chase [phone]).  Not sure who I need to speak to.  My Continuous FMLA ends this week.  Calling to request a change of continuous FMLA leave to per surgeon to intermittent for chemotherapy.  A form was dropped off previously when I was to begin chemotherapy."    Advised to contact Employer Human Resources Department with this request.  H.R. determines qualifications, leave program needed and qualifications to enroll in a program needed based on 12 weeks allotted annually based on qualifications.  Form completion guidelines for empl.  Medical Oncology signed for intermittent leave to begin 10/05/2019 through 10/15/2020 however Employer HR will evaluate time remaining from continuous leave and type of leave program allowed or qualifies for at this time.  "I believe I have 4.5 weeks left of FMLA however Short Term Disability has not expired.  May I have a copy of the leave signed in May?"

## 2019-11-09 NOTE — Progress Notes (Signed)
[place]   Telephone:([phone]) [phone] Fax:(336) [phone]   Clinic Follow up Note   Patient Care Team: Delsa Bern, MD as PCP - General (Obstetrics and Gynecology) Richrd Prime as Consulting Physician (Obstetrics and Gynecology) Mauro Kaufmann, RN as Oncology Nurse Navigator Rockwell Germany, RN as Oncology Nurse Navigator Stark Klein, MD as Consulting Physician (General Surgery) Truitt Merle, MD as Consulting Physician (Hematology) Kyung Rudd, MD as Consulting Physician (Radiation Oncology) Register, Luetta Nutting, PA-C as Physician Assistant (Dermatology) Stephanie Chase Going, DO as Attending Physician (Plastic Surgery) 11/10/2019  CHIEF COMPLAINT: F/u right breast cancer s/p mastectomy with wound necrosis s/p debridement  SUMMARY OF ONCOLOGIC HISTORY: Oncology History Overview Note  Cancer Staging Malignant neoplasm of upper-outer quadrant of right breast in female, estrogen receptor positive (Stephanie Chase) Staging form: Breast, AJCC 8th Edition - Clinical stage from 07/30/2019: Stage IA (cT1c, cN0, cM0, G3, ER+, PR-, HER2+) - Unsigned    Malignant neoplasm of upper-outer quadrant of right breast in female, estrogen receptor positive (Stephanie Chase)  07/03/2019 Mammogram   Diagnostic Mammogram  IMPRESSION There is a 1.3x1.1cm focal asymmetry in the retroareolar anterior to middle depth right breast 2.2cm from the nipple.  There is a 2 cm focal asymmetry with appearance in the upper outer quadrant posterior depth of right breast 3 cm from nipple -Both suspicious and biopsy recommended.     07/18/2019 Initial Biopsy   Diagnosis 07/18/19 1. Breast, right, needle core biopsy, 12 o'clock - INVASIVE DUCTAL CARCINOMA. 2. Breast, right, needle core biopsy, upper outer quadrant - DUCTAL CARCINOMA IN SITU. Microscopic Comment 1. The invasive carcinoma is nuclear grade 3. The greatest linear extent of tumor in any one core is 11 mm. Ancillary studies will be reported separately. CKAE1AE3,  GATA-3, ER and E-cadherin are positive. PAX 8 is negative. CK7 is noncontributory. 2. The in situ carcinoma is high nuclear grade with central necrosis and calcifications. E-cadherin is positive. P63, Calponin and SMM-1 demonstrate the present of myoepithelium.   07/18/2019 Receptors her2   1. PROGNOSTIC INDICATORS 07/18/19 Results: IMMUNOHISTOCHEMICAL AND MORPHOMETRIC ANALYSIS PERFORMED MANUALLY The tumor cells are POSITIVE for Her2 (3+). Of note, the tumor shows heterogeneity in regards to Her2 expression. Estrogen Receptor: 95%, POSITIVE, STRONG STAINING INTENSITY Progesterone Receptor: 0%, NEGATIVE Proliferation Marker Ki67: 50%   07/24/2019 Initial Diagnosis   Malignant neoplasm of upper-outer quadrant of right breast in female, estrogen receptor positive (Stephanie Chase)   08/01/2019 Breast MRI   IMPRESSION: 1. Known invasive ductal carcinoma measures 1.2 centimeters in the 12 o'clock location. 2. Non mass enhancement measures 2.3 centimeters in the LATERAL portion of the RIGHT breast corresponding to DCIS on biopsy. 3. An additional mass in the UPPER OUTER QUADRANT of the RIGHT breast is 1.5 centimeters and warrants tissue diagnosis. This area correlates with distortion seen mammographically. 4. No axillary adenopathy or ancillary findings. LEFT breast is negative.     08/04/2019 Echocardiogram   IMPRESSIONS     1. Left ventricular ejection fraction, by estimation, is 65 to 70%. The  left ventricle has normal function. The left ventricle has no regional  wall motion abnormalities. Left ventricular diastolic parameters were  normal. The average left ventricular  global longitudinal strain is -20.4 %.   2. Right ventricular systolic function is normal. The right ventricular  size is normal.   3. The mitral valve is normal in structure. No evidence of mitral valve  regurgitation. No evidence of mitral stenosis.   4. The aortic valve is normal in structure. Aortic valve regurgitation  is   not visualized. No aortic stenosis is present.    08/10/2019 Genetic Testing   Negative genetic testing:  No pathogenic variants detected on the [facility] Breast Cancer STAT panel + Common Hereditary Cancers panel. A variant of uncertain significance (VUS) was detected in the ATM gene called c.3834C>A. The report date is 08/10/2019.  The Breast Cancer STAT Panel offered by [facility] includes sequencing and deletion/duplication analysis for the following 9 genes:  ATM, BRCA1, BRCA2, CDH1, CHEK2, PALB2, PTEN, STK11 and TP53. The Common Hereditary Cancers Panel offered by [facility] includes sequencing and/or deletion duplication testing of the following 48 genes: APC, ATM, AXIN2, BARD1, BMPR1A, BRCA1, BRCA2, BRIP1, CDH1, CDK4, CDKN2A (p14ARF), CDKN2A (p16INK4a), CHEK2, CTNNA1, DICER1, EPCAM (Deletion/duplication testing only), GREM1 (promoter region deletion/duplication testing only), KIT, MEN1, MLH1, MSH2, MSH3, MSH6, MUTYH, NBN, NF1, NHTL1, PALB2, PDGFRA, PMS2, POLD1, POLE, PTEN, RAD50, RAD51C, RAD51D, RNF43, SDHB, SDHC, SDHD, SMAD4, SMARCA4. STK11, TP53, TSC1, TSC2, and VHL.  The following genes were evaluated for sequence changes only: SDHA and HOXB13 c.251G>A variant only.    09/05/2019 Surgery   BILATERAL MASTECTOMY WITH RIGHT SENTINEL LYMPH NODE BIOPSY and PAC placement by Dr Barry Dienes    09/05/2019 Pathology Results   FINAL MICROSCOPIC DIAGNOSIS:   A. BREAST, LEFT, MASTECTOMY:  - Benign breast tissue.   B. BREAST, RIGHT, MASTECTOMY:  - Invasive ductal carcinoma, multifocal, 1.4 cm in greatest dimension,  Nottingham grade 3 of 3.  - Ductal carcinoma in situ, high nuclear grade with central necrosis and  calcifications.  - Margins of resection are not involved.  - Biopsy sites.  - See oncology table.   C. SENTINEL LYMPH NODE, RIGHT AXILLARY #1, BIOPSY:  - One lymph node, negative for carcinoma (0/1).   D. SENTINEL LYMPH NODE, RIGHT AXILLARY #2, BIOPSY:  - One lymph node, negative for carcinoma  (0/1).   E. SENTINEL LYMPH NODE, RIGHT AXILLARY #3, BIOPSY:  - One lymph node, negative for carcinoma (0/1).     Estrogen Receptor: 95%, positive, strong             Progesterone Receptor: 0%, negative             HER2: Positive (3+)             Ki-67: 50%    09/05/2019 Cancer Staging   Staging form: Breast, AJCC 8th Edition - Pathologic stage from 09/05/2019: Stage IA (pT1c, pN0, cM0, G3, ER+, PR-, HER2+) - Signed by Gardenia Phlegm, NP on 09/17/2019   10/22/2019 Surgery   Excision of right breast wound and APPLICATION OF A-CELL, INTEGRA BWM OR CELLERATE by Dr Marla Roe on 10/22/19   10/27/2019 -  Chemotherapy   Weekly Taxol and Herceptin for 12 weeks starting 10/27/19 then maintenance Herceptin q3weeks to complete 1 year treatment. Given recent right breast infection and debridement, will start Herceptin with loading dose (q2weeks) and add Taxol with week 3 then continuing with both weekly.      CURRENT THERAPY:  Weekly Taxol and Herceptin for 12 weeks starting 10/27/19 then maintenance Herceptin q3weeks to complete 1 year treatment. Given recent right breast infection and debridement, will start Herceptin with loading dose (q2weeks) and add Taxol with week 3 then continuing with both weekly.   INTERVAL HISTORY: Ms. Palma returns for f/u and treatment as scheduled. She began herceptin on 10/27/19. She was seen last week by Dr. Marla Roe.  Her wound continues to heal very well.  On day 2 she developed tiredness and fatigue, on day 3 she  had mild lightheadedness, legs felt "wobbly "and she had diarrhea in the evening that resolved on its own.  She had mild nausea when she drank water but remained able to tolerate food and liquids. Did not require anti-emetics or imodium.  On 8/5 she still had fatigue and developed ringing in her ears and felt that her body was "leaning off to 1 side," denies dizziness.  She fell once when she did not bring her foot up high enough to step over a curb.  She  remained able to function and complete all her ADLs and exercise.  She is walking 1 mile now instead of her normal 2 miles.  She takes Xanax as needed. Denies pain, not taking oxycodone.  She is taking Claritin which she thinks is helping her fatigue.  Denies other new medication.  Denies fever, chills, cough, chest pain, dyspnea, neuropathy, mucositis, rash.   MEDICAL HISTORY:  Past Medical History:  Diagnosis Date  . Anemia   . Anxiety   . Asthma   . Breast cancer ([place])   . COPD (chronic obstructive pulmonary disease) ([place])   . Discoid lupus   . Family history of bladder cancer   . Family history of BRCA gene mutation   . Family history of breast cancer   . Family history of prostate cancer   . GERD (gastroesophageal reflux disease)   . Hypothyroidism   . Pneumonia   . Thyroid disease     SURGICAL HISTORY: Past Surgical History:  Procedure Laterality Date  . ABLATION ON ENDOMETRIOSIS    . APPLICATION OF A-CELL OF EXTREMITY Right 10/22/2019   Procedure: EXCISION OF RIGHT BREAST WOUND WITH PRIMARY CLOSURE;  Surgeon: Stephanie Chase Going, DO;  Location: [facility];  Service: Plastics;  Laterality: Right;  . BREAST LUMPECTOMY    . DEBRIDEMENT AND CLOSURE WOUND Right 10/22/2019   Procedure: Excision of right breast wound;  Surgeon: Stephanie Chase Going, DO;  Location: [facility];  Service: Plastics;  Laterality: Right;  . MASTECTOMY W/ SENTINEL NODE BIOPSY Bilateral 09/05/2019   Procedure: BILATERAL MASTECTOMY WITH RIGHT SENTINEL LYMPH NODE BIOPSY;  Surgeon: Stark Klein, MD;  Location: [facility];  Service: General;  Laterality: Bilateral;  COMBINED WITH REGIONAL FOR POST OP PAIN  . PORTACATH PLACEMENT Left 09/05/2019   Procedure: INSERTION PORT-A-CATH WITH ULTRASOUND GUIDANCE;  Surgeon: Stark Klein, MD;  Location: [facility];  Service: General;  Laterality: Left;  . TONSILLECTOMY    . TUBAL LIGATION    . WISDOM TOOTH EXTRACTION      I have reviewed the social history and family history with the  patient and they are unchanged from previous note.  ALLERGIES:  is allergic to bactrim [sulfamethoxazole-trimethoprim].  MEDICATIONS:  Current Outpatient Medications  Medication Sig Dispense Refill  . acetaminophen (TYLENOL) 500 MG tablet Take 500-1,000 mg by mouth every 6 (six) hours as needed for moderate pain.     . Albuterol Sulfate (PROAIR RESPICLICK) 017 (90 Base) MCG/ACT AEPB Inhale 2 puffs into the lungs every 6 (six) hours as needed. (Patient taking differently: Inhale 2 puffs into the lungs every 6 (six) hours as needed (SOB). ) 1 each 3  . ALPRAZolam (XANAX) 0.25 MG tablet Take 1 tablet (0.25 mg total) by mouth 2 (two) times daily as needed for anxiety. (Patient taking differently: Take 0.25 mg by mouth daily as needed for anxiety. ) 20 tablet 0  . Ascorbic Acid (VITAMIN C) 1000 MG tablet Take 1,000 mg by mouth daily.    . cetirizine (  ZYRTEC) 10 MG tablet Take 10 mg by mouth daily as needed for allergies.     . Cholecalciferol (VITAMIN D) 50 MCG (2000 UT) tablet Take 2,000 Units by mouth daily.    Marland Kitchen dextromethorphan (DELSYM) 30 MG/5ML liquid Take 60 mg by mouth 2 (two) times daily as needed for cough.    . fluticasone (FLONASE) 50 MCG/ACT nasal spray Place 1 spray into both nostrils daily as needed for allergies.     Marland Kitchen levothyroxine (SYNTHROID) 75 MCG tablet Take 75 mcg by mouth daily before breakfast.    . lidocaine-prilocaine (EMLA) cream Apply 1 application topically as needed. (Patient taking differently: Apply 1 application topically daily as needed (port access). ) 30 g 1  . liothyronine (CYTOMEL) 5 MCG tablet TAKE 2 TABLETS (=10MCG     TOTAL)     DAILY (Patient taking differently: Take 10 mcg by mouth daily. ) 180 tablet 1  . methocarbamol (ROBAXIN) 500 MG tablet Take 1 tablet (500 mg total) by mouth every 6 (six) hours as needed (use for muscle cramps/pain). 20 tablet 1  . Multiple Vitamin (MULTIVITAMIN WITH MINERALS) TABS tablet Take 1 tablet by mouth 2 (two) times daily.    .  nicotine (NICODERM CQ - DOSED IN MG/24 HOURS) 21 mg/24hr patch Place 21 mg onto the skin daily.    . ondansetron (ZOFRAN) 8 MG tablet Take 1 tablet (8 mg total) by mouth 2 (two) times daily as needed (Nausea or vomiting). 30 tablet 1  . OVER THE COUNTER MEDICATION Take 1 each by mouth 2 (two) times daily as needed (anxiety / sleep). CBD Gummies    . oxyCODONE (OXY IR/ROXICODONE) 5 MG immediate release tablet Take 1 tablet (5 mg total) by mouth every 6 (six) hours as needed for breakthrough pain. 20 tablet 0  . prochlorperazine (COMPAZINE) 10 MG tablet Take 1 tablet (10 mg total) by mouth every 6 (six) hours as needed (Nausea or vomiting). 30 tablet 1  . Zinc Sulfate 220 (50 Zn) MG TABS TAKE 1 TABLET (220 MG TOTAL) BY MOUTH IN THE MORNING AND AT BEDTIME. 60 tablet 0   Current Facility-Administered Medications  Medication Dose Route Frequency Provider Last Rate Last Admin  . 0.9 %  sodium chloride infusion   Intravenous Once Alla Feeling, NP       Facility-Administered Medications Ordered in Other Visits  Medication Dose Route Frequency Provider Last Rate Last Admin  . alum & mag hydroxide-simeth (MAALOX/MYLANTA) 200-200-20 MG/5ML suspension 30 mL  30 mL Oral Once Harle Stanford., PA-C        PHYSICAL EXAMINATION: ECOG PERFORMANCE STATUS: 1 - Symptomatic but completely ambulatory  Vitals:   11/10/19 1000  BP: 127/88  Pulse: 79  Resp: 20  Temp: 97.9 F (36.6 C)  SpO2: 100%   Filed Weights   11/10/19 1000  Weight: 158 lb 14.4 oz (72.1 kg)    GENERAL:alert, no distress and comfortable SKIN: no rash to exposed skin  EYES: sclera clear LUNGS: clear with normal breathing effort HEART: regular rate & rhythm, no lower extremity edema NEURO: alert & oriented x 3 with fluent speech, no focal motor/sensory deficits. Intact peripheral vibratory sense over the fingertips per tuning fork exam. Steady gait Breast: s/p bilateral mastectomy and right chest wound debridement, healing well  without erythema or drainage. No necrotic tissue  PAC without erythema    LABORATORY DATA:  I have reviewed the data as listed CBC Latest Ref Rng & Units 11/10/2019 10/27/2019 10/22/2019  WBC  4.0 - 10.5 K/uL 9.3 9.7 8.1  Hemoglobin 12.0 - 15.0 g/dL 13.3 14.2 13.0  Hematocrit 36 - 46 % 40.2 42.4 39.7  Platelets 150 - 400 K/uL 264 279 271     CMP Latest Ref Rng & Units 11/10/2019 10/27/2019 10/22/2019  Glucose 70 - 99 mg/dL 89 82 86  BUN 6 - 20 mg/dL '7 13 6  ' Creatinine 0.44 - 1.00 mg/dL 0.71 0.64 0.45  Sodium 135 - 145 mmol/L 138 137 136  Potassium 3.5 - 5.1 mmol/L 4.2 4.1 3.6  Chloride 98 - 111 mmol/L 106 107 104  CO2 22 - 32 mmol/L 21(L) 23 24  Calcium 8.9 - 10.3 mg/dL 10.5(H) 11.2(H) 10.2  Total Protein 6.5 - 8.1 g/dL 7.2 7.4 -  Total Bilirubin 0.3 - 1.2 mg/dL 0.3 0.2(L) -  Alkaline Phos 38 - 126 U/L 111 88 -  AST 15 - 41 U/L 33 21 -  ALT 0 - 44 U/L 31 14 -      RADIOGRAPHIC STUDIES: I have personally reviewed the radiological images as listed and agreed with the findings in the report. No results found.   ASSESSMENT & PLAN: Stephanie Chase a 59 y.o.femalewith   1.Malignant neoplasm of upper-outer quadrant of right breast,invasive ductal carcinoma and DCIS,StageIA,pT1cN0M0,ER+/HER2+, PR-,Grade III -She was diagnosed in 06/2019.Given multifocal disease, she underwent right mastectomy and SLNB and prophylactic left mastectomy by Dr Barry Dienes on 09/05/19. Left chest port placed during surgery. -Due to node-negative, postmastectomy radiation is not recommended -Given her HER2 positive high grade disease, adjuvant chemotherapy is recommended to reduce her high risk of recurrence. Given her small tumorand negative node, Dr. Burr Medico recommended less intensiveadjuvantchemotherapy with weekly Taxol and anti-HER2 Herceptin for 12 weeks and continue maintenance Herceptinq3weeksalone to complete 1 year treatment.She has the option of Herceptin injections after chemo.  She consented  to treatment.  Goal is curative. -plan to begin adjuvant anti-estrogen therapy after chemo -due to right mastectomy wound necrosis s/p debridement, chemo was postponed; she started herceptin with loading dose on 8/2 -Ms Massey appears stable. She completed cycle 1 herceptin on 8/2, she tolerated moderately well with fatigue and mild nausea/diarrhea. We reviewed symptom management. Symptoms resolved and she recovered well.  -she also developed tinnitus and feeling off balance, no neuropathy on exam. Unclear if this is treatment-related vs other medication (claritin, xanax), dehydration, or other. I encouraged her to eat frequently, hydrate well, monitor when she notices symptoms. Will watch closely  -CBC and CMP adequate to proceed with herceptin and first taxol today. We will add IVF later this week. She knows to call if symptoms do not improve with supportive meds at home.  -f/u next week with herceptin/taxol   2.  Right mastectomy wound  -She had been healing well from bilateral mastectomy on 6/11 per Dr. Barry Dienes until she developed yellow tissue and mild drainage on 7/3 -Seen at urgent care on 7/3 and was treated for cellulitis with Bactrim -Two days later she developed a rash on her chest, back, and arms felt to be from bactrim, with redness at the incision.  She was told to go to the ED for evaluation -Images in the ED consistent with ischemic tissue, no concern for cellulitis.  Bactrim was stopped and rash resolved -Exam on 10/02/19 showed small area of necrosis to the central incision, no signs of infection. See image.  -s/p wound debridement by Dr. Audelia Hives on 10/22/19, she is healing very well.  -She began adjuvant treatment with loading dose herceptin only  on 10/27/19, plan to add taxol today (week 3)  3. Genetic Testing  -She has family history of breast, bladder, prostate cancer. -She does have negative genetic testing: No pathogenic variants detected on the [facility] Breast  Cancer STAT panel + Common Hereditary Cancers panel. A variant of uncertain significance (VUS) was detected in the ATM gene called c.3834C>A. The report date is 08/10/2019.She can repeat testing in 5-10 years.  4. Smoking Cessation, COPD -She has been smoking since the 2nd grade. She has been able to cut down recently to half pack per day.  -Per pt at some point she was diagnosed with COPD or Emphysema. She is on albuterol inhaler and Singulair. -Her wound necrosis is possibly related to smoking -she has cut back, using nicotine patch. She was down to 5-6 cigarettes per day but went back to just under a pack,   5. Comorbidities:DiscoidLupus, Vitiligo, Thyroid disease.  -She was diagnosed with Lupus 10 years ago. She uses steroid cream as needed for her lupus rashes, some skin lesions of which have been removed. -She sees her dermatologist Dr Nevada Crane and their [place].  -She is on 2 thyroid medications.  6. Social Support, Anxiety  -Ongoing -Started on low doseXanaxPRN 07/30/2019, okay to take before chemo. -She previously declined social work referral  PLAN: -Labs reviewed -Proceed with herceptin and first taxol today -IVF later this week -F/u next week with taxol/herceptin  -on [id number], no AE's from study; appropriate forms completed and co-signed by Dr. Burr Medico  All questions were answered. The patient knows to call the clinic with any problems, questions or concerns. No barriers to learning were detected. Total encounter time was 30 minutes.      Alla Feeling, NP 11/10/19

## 2019-11-10 ENCOUNTER — Inpatient Hospital Stay: Payer: BC Managed Care – PPO

## 2019-11-10 ENCOUNTER — Ambulatory Visit: Payer: BC Managed Care – PPO | Admitting: Physical Therapy

## 2019-11-10 ENCOUNTER — Telehealth: Payer: Self-pay | Admitting: Nurse Practitioner

## 2019-11-10 ENCOUNTER — Encounter: Payer: Self-pay | Admitting: Nurse Practitioner

## 2019-11-10 ENCOUNTER — Encounter: Payer: Self-pay | Admitting: *Deleted

## 2019-11-10 ENCOUNTER — Other Ambulatory Visit: Payer: Self-pay | Admitting: *Deleted

## 2019-11-10 ENCOUNTER — Ambulatory Visit (HOSPITAL_BASED_OUTPATIENT_CLINIC_OR_DEPARTMENT_OTHER): Payer: BC Managed Care – PPO | Admitting: Medical

## 2019-11-10 ENCOUNTER — Other Ambulatory Visit: Payer: Self-pay

## 2019-11-10 ENCOUNTER — Other Ambulatory Visit: Payer: Self-pay | Admitting: Medical

## 2019-11-10 ENCOUNTER — Inpatient Hospital Stay (HOSPITAL_BASED_OUTPATIENT_CLINIC_OR_DEPARTMENT_OTHER): Payer: BC Managed Care – PPO | Admitting: Nurse Practitioner

## 2019-11-10 ENCOUNTER — Ambulatory Visit: Payer: BC Managed Care – PPO

## 2019-11-10 ENCOUNTER — Encounter: Payer: Self-pay | Admitting: Radiology

## 2019-11-10 VITALS — BP 127/88 | HR 79 | Temp 97.9°F | Resp 20 | Ht 65.0 in | Wt 158.9 lb

## 2019-11-10 VITALS — BP 122/80 | HR 72 | Temp 98.4°F | Resp 20

## 2019-11-10 DIAGNOSIS — Z170 Estrogen receptor positive status [ER+]: Secondary | ICD-10-CM | POA: Diagnosis not present

## 2019-11-10 DIAGNOSIS — K219 Gastro-esophageal reflux disease without esophagitis: Secondary | ICD-10-CM

## 2019-11-10 DIAGNOSIS — C50411 Malignant neoplasm of upper-outer quadrant of right female breast: Secondary | ICD-10-CM

## 2019-11-10 DIAGNOSIS — R42 Dizziness and giddiness: Secondary | ICD-10-CM

## 2019-11-10 DIAGNOSIS — Z5112 Encounter for antineoplastic immunotherapy: Secondary | ICD-10-CM | POA: Diagnosis not present

## 2019-11-10 LAB — CBC WITH DIFFERENTIAL (CANCER CENTER ONLY)
Abs Immature Granulocytes: 0.04 10*3/uL (ref 0.00–0.07)
Basophils Absolute: 0.1 10*3/uL (ref 0.0–0.1)
Basophils Relative: 1 %
Eosinophils Absolute: 0 10*3/uL (ref 0.0–0.5)
Eosinophils Relative: 0 %
HCT: 40.2 % (ref 36.0–46.0)
Hemoglobin: 13.3 g/dL (ref 12.0–15.0)
Immature Granulocytes: 0 %
Lymphocytes Relative: 21 %
Lymphs Abs: 1.9 10*3/uL (ref 0.7–4.0)
MCH: 28.7 pg (ref 26.0–34.0)
MCHC: 33.1 g/dL (ref 30.0–36.0)
MCV: 86.6 fL (ref 80.0–100.0)
Monocytes Absolute: 0.5 10*3/uL (ref 0.1–1.0)
Monocytes Relative: 5 %
Neutro Abs: 6.7 10*3/uL (ref 1.7–7.7)
Neutrophils Relative %: 73 %
Platelet Count: 264 10*3/uL (ref 150–400)
RBC: 4.64 MIL/uL (ref 3.87–5.11)
RDW: 12.8 % (ref 11.5–15.5)
WBC Count: 9.3 10*3/uL (ref 4.0–10.5)
nRBC: 0 % (ref 0.0–0.2)

## 2019-11-10 LAB — CMP (CANCER CENTER ONLY)
ALT: 31 U/L (ref 0–44)
AST: 33 U/L (ref 15–41)
Albumin: 3.8 g/dL (ref 3.5–5.0)
Alkaline Phosphatase: 111 U/L (ref 38–126)
Anion gap: 11 (ref 5–15)
BUN: 7 mg/dL (ref 6–20)
CO2: 21 mmol/L — ABNORMAL LOW (ref 22–32)
Calcium: 10.5 mg/dL — ABNORMAL HIGH (ref 8.9–10.3)
Chloride: 106 mmol/L (ref 98–111)
Creatinine: 0.71 mg/dL (ref 0.44–1.00)
GFR, Est AFR Am: >60 mL/min (ref >60)
GFR, Estimated: >60 mL/min (ref >60)
Glucose, Bld: 89 mg/dL (ref 70–99)
Potassium: 4.2 mmol/L (ref 3.5–5.1)
Sodium: 138 mmol/L (ref 135–145)
Total Bilirubin: 0.3 mg/dL (ref 0.3–1.2)
Total Protein: 7.2 g/dL (ref 6.5–8.1)

## 2019-11-10 LAB — RESEARCH LABS

## 2019-11-10 MED ORDER — HEPARIN SOD (PORK) LOCK FLUSH 100 UNIT/ML IV SOLN
500.0000 [IU] | Freq: Once | INTRAVENOUS | Status: AC | PRN
  Administered 2019-11-10: 500 [IU]
  Filled 2019-11-10: qty 5

## 2019-11-10 MED ORDER — ACETAMINOPHEN 325 MG PO TABS
ORAL_TABLET | ORAL | Status: AC
  Filled 2019-11-10: qty 2

## 2019-11-10 MED ORDER — LORATADINE 10 MG PO TABS
10.0000 mg | ORAL_TABLET | Freq: Every day | ORAL | Status: DC
  Administered 2019-11-10: 10 mg via ORAL

## 2019-11-10 MED ORDER — SODIUM CHLORIDE 0.9 % IV SOLN
Freq: Once | INTRAVENOUS | Status: DC
  Filled 2019-11-10: qty 250

## 2019-11-10 MED ORDER — ACETAMINOPHEN 325 MG PO TABS
650.0000 mg | ORAL_TABLET | Freq: Once | ORAL | Status: AC
  Administered 2019-11-10: 650 mg via ORAL

## 2019-11-10 MED ORDER — SODIUM CHLORIDE 0.9 % IV SOLN
20.0000 mg | Freq: Once | INTRAVENOUS | Status: AC
  Administered 2019-11-10: 20 mg via INTRAVENOUS
  Filled 2019-11-10: qty 20

## 2019-11-10 MED ORDER — ALUM & MAG HYDROXIDE-SIMETH 200-200-20 MG/5ML PO SUSP
ORAL | Status: AC
  Filled 2019-11-10: qty 30

## 2019-11-10 MED ORDER — ALUM & MAG HYDROXIDE-SIMETH 200-200-20 MG/5ML PO SUSP
30.0000 mL | Freq: Once | ORAL | Status: AC
  Administered 2019-11-10: 30 mL via ORAL

## 2019-11-10 MED ORDER — FAMOTIDINE IN NACL 20-0.9 MG/50ML-% IV SOLN
20.0000 mg | Freq: Once | INTRAVENOUS | Status: AC
  Administered 2019-11-10: 20 mg via INTRAVENOUS

## 2019-11-10 MED ORDER — SODIUM CHLORIDE 0.9 % IV SOLN
Freq: Once | INTRAVENOUS | Status: AC
  Filled 2019-11-10: qty 250

## 2019-11-10 MED ORDER — TRASTUZUMAB-ANNS CHEMO 150 MG IV SOLR
150.0000 mg | Freq: Once | INTRAVENOUS | Status: AC
  Administered 2019-11-10: 150 mg via INTRAVENOUS
  Filled 2019-11-10: qty 7.14

## 2019-11-10 MED ORDER — SODIUM CHLORIDE 0.9 % IV SOLN
80.0000 mg/m2 | Freq: Once | INTRAVENOUS | Status: AC
  Administered 2019-11-10: 144 mg via INTRAVENOUS
  Filled 2019-11-10: qty 24

## 2019-11-10 MED ORDER — SODIUM CHLORIDE 0.9% FLUSH
10.0000 mL | INTRAVENOUS | Status: DC | PRN
  Administered 2019-11-10: 10 mL
  Filled 2019-11-10: qty 10

## 2019-11-10 MED ORDER — FAMOTIDINE IN NACL 20-0.9 MG/50ML-% IV SOLN
20.0000 mg | Freq: Once | INTRAVENOUS | Status: AC | PRN
  Administered 2019-11-10: 20 mg via INTRAVENOUS

## 2019-11-10 MED ORDER — LORATADINE 10 MG PO TABS
ORAL_TABLET | ORAL | Status: AC
  Filled 2019-11-10: qty 1

## 2019-11-10 MED ORDER — FAMOTIDINE IN NACL 20-0.9 MG/50ML-% IV SOLN
INTRAVENOUS | Status: AC
  Filled 2019-11-10: qty 50

## 2019-11-10 MED ORDER — ALUM & MAG HYDROXIDE-SIMETH 200-200-20 MG/5ML PO SUSP: 30.0000 mL | Freq: Once | ORAL | Status: DC

## 2019-11-10 NOTE — Patient Instructions (Signed)
[facility] Discharge Instructions for Patients Receiving Chemotherapy  Today you received the following chemotherapy agents Trastuzumab-anns Calla Kicks) & Paclitaxel (TAXOL).  To help prevent nausea and vomiting after your treatment, we encourage you to take your nausea medication as prescribed.   If you develop nausea and vomiting that is not controlled by your nausea medication, call the clinic.   BELOW ARE SYMPTOMS THAT SHOULD BE REPORTED IMMEDIATELY:  *FEVER GREATER THAN 100.5 F  *CHILLS WITH OR WITHOUT FEVER  NAUSEA AND VOMITING THAT IS NOT CONTROLLED WITH YOUR NAUSEA MEDICATION  *UNUSUAL SHORTNESS OF BREATH  *UNUSUAL BRUISING OR BLEEDING  TENDERNESS IN MOUTH AND THROAT WITH OR WITHOUT PRESENCE OF ULCERS  *URINARY PROBLEMS  *BOWEL PROBLEMS  UNUSUAL RASH Items with * indicate a potential emergency and should be followed up as soon as possible.  Feel free to call the clinic should you have any questions or concerns. The clinic phone number is [phone].  Please show the [facility] at check-in to the Emergency Department and triage nurse.  Paclitaxel injection What is this medicine? PACLITAXEL (PAK li TAX el) is a chemotherapy drug. It targets fast dividing cells, like cancer cells, and causes these cells to die. This medicine is used to treat ovarian cancer, breast cancer, lung cancer, Kaposi's sarcoma, and other cancers. This medicine may be used for other purposes; ask your health care provider or pharmacist if you have questions. COMMON BRAND NAME(S): Onxol, Taxol What should I tell my health care provider before I take this medicine? They need to know if you have any of these conditions:  history of irregular heartbeat  liver disease  low blood counts, like low white cell, platelet, or red cell counts  lung or breathing disease, like asthma  tingling of the fingers or toes, or other nerve disorder  an unusual or allergic reaction to  paclitaxel, alcohol, polyoxyethylated castor oil, other chemotherapy, other medicines, foods, dyes, or preservatives  pregnant or trying to get pregnant  breast-feeding How should I use this medicine? This drug is given as an infusion into a vein. It is administered in a hospital or clinic by a specially trained health care professional. Talk to your pediatrician regarding the use of this medicine in children. Special care may be needed. Overdosage: If you think you have taken too much of this medicine contact a poison control center or emergency room at once. NOTE: This medicine is only for you. Do not share this medicine with others. What if I miss a dose? It is important not to miss your dose. Call your doctor or health care professional if you are unable to keep an appointment. What may interact with this medicine? Do not take this medicine with any of the following medications:  disulfiram  metronidazole This medicine may also interact with the following medications:  antiviral medicines for hepatitis, HIV or AIDS  certain antibiotics like erythromycin and clarithromycin  certain medicines for fungal infections like ketoconazole and itraconazole  certain medicines for seizures like carbamazepine, phenobarbital, phenytoin  gemfibrozil  nefazodone  rifampin  St. John's wort This list may not describe all possible interactions. Give your health care provider a list of all the medicines, herbs, non-prescription drugs, or dietary supplements you use. Also tell them if you smoke, drink alcohol, or use illegal drugs. Some items may interact with your medicine. What should I watch for while using this medicine? Your condition will be monitored carefully while you are receiving this medicine. You will  need important blood work done while you are taking this medicine. This medicine can cause serious allergic reactions. To reduce your risk you will need to take other medicine(s)  before treatment with this medicine. If you experience allergic reactions like skin rash, itching or hives, swelling of the face, lips, or tongue, tell your doctor or health care professional right away. In some cases, you may be given additional medicines to help with side effects. Follow all directions for their use. This drug may make you feel generally unwell. This is not uncommon, as chemotherapy can affect healthy cells as well as cancer cells. Report any side effects. Continue your course of treatment even though you feel ill unless your doctor tells you to stop. Call your doctor or health care professional for advice if you get a fever, chills or sore throat, or other symptoms of a cold or flu. Do not treat yourself. This drug decreases your body's ability to fight infections. Try to avoid being around people who are sick. This medicine may increase your risk to bruise or bleed. Call your doctor or health care professional if you notice any unusual bleeding. Be careful brushing and flossing your teeth or using a toothpick because you may get an infection or bleed more easily. If you have any dental work done, tell your dentist you are receiving this medicine. Avoid taking products that contain aspirin, acetaminophen, ibuprofen, naproxen, or ketoprofen unless instructed by your doctor. These medicines may hide a fever. Do not become pregnant while taking this medicine. Women should inform their doctor if they wish to become pregnant or think they might be pregnant. There is a potential for serious side effects to an unborn child. Talk to your health care professional or pharmacist for more information. Do not breast-feed an infant while taking this medicine. Men are advised not to father a child while receiving this medicine. This product may contain alcohol. Ask your pharmacist or healthcare provider if this medicine contains alcohol. Be sure to tell all healthcare providers you are taking this  medicine. Certain medicines, like metronidazole and disulfiram, can cause an unpleasant reaction when taken with alcohol. The reaction includes flushing, headache, nausea, vomiting, sweating, and increased thirst. The reaction can last from 30 minutes to several hours. What side effects may I notice from receiving this medicine? Side effects that you should report to your doctor or health care professional as soon as possible:  allergic reactions like skin rash, itching or hives, swelling of the face, lips, or tongue  breathing problems  changes in vision  fast, irregular heartbeat  high or low blood pressure  mouth sores  pain, tingling, numbness in the hands or feet  signs of decreased platelets or bleeding - bruising, pinpoint red spots on the skin, black, tarry stools, blood in the urine  signs of decreased red blood cells - unusually weak or tired, feeling faint or lightheaded, falls  signs of infection - fever or chills, cough, sore throat, pain or difficulty passing urine  signs and symptoms of liver injury like dark yellow or brown urine; general ill feeling or flu-like symptoms; light-colored stools; loss of appetite; nausea; right upper belly pain; unusually weak or tired; yellowing of the eyes or skin  swelling of the ankles, feet, hands  unusually slow heartbeat Side effects that usually do not require medical attention (report to your doctor or health care professional if they continue or are bothersome):  diarrhea  hair loss  loss of appetite  muscle   or joint pain  nausea, vomiting  pain, redness, or irritation at site where injected  tiredness This list may not describe all possible side effects. Call your doctor for medical advice about side effects. You may report side effects to FDA at 1-800-FDA-1088. Where should I keep my medicine? This drug is given in a hospital or clinic and will not be stored at home. NOTE: This sheet is a summary. It may not  cover all possible information. If you have questions about this medicine, talk to your doctor, pharmacist, or health care provider.  2020 Elsevier/Gold Standard (2016-11-14 13:14:55)

## 2019-11-10 NOTE — Telephone Encounter (Signed)
Scheduled per 8/16 los. Messaged RN [place] to give pt appt calendar.

## 2019-11-10 NOTE — Research (Signed)
Q7622, A PROSPECTIVE OBSERVATIONAL COHORT STUDY TO DEVELOP A PREDICTIVE MODEL OF TAXANE-INDUCED PERIPHERAL NEUROPATHY IN CANCER PATIENTS.  11/10/19 10:15AM  BASELINE:  Registration; Patient meets all eligibility requirements to be enrolled on the S1714 study.  Eligibility also confirmed by Wilber Bihari, RN, Clinical Research Nurse.  Labs: Baseline mandatory only research labs were collected per protocol along with other standard of care labs ordered by provider, including serum creatinine level. History of Falls: Patient stated she has had falls in the past 6 months with the most recent one being Saturday 8/14.  Medical and Smoking History; Reviewed with patient and CRFs completed. Patient does not have any history of neuropathy.  Supplements, Topical Agents and Other Treatments: Reviewed with patient and CRFs completed. Patient is taking acetaminophen but not for neuropathy symptoms.  She is using Utter[place] cream once daily.  Neuropen Assessment: Completed per protocol by Otilio Miu, certified research RN with assistance recording by myself.  T[place] Assessment: Completed per protocol by Otilio Miu, certified research RN with assistance timing and recording by myself.  Timed Get Up and Go Test: Completed per protocol by this trained clinical research coordinator.  Physician Assessments: CTCAE and Treatment Burden forms completed and signed by Garry Heater, NP, and cosigned by Dr. Burr Medico. Patient denies any neuropathy symptoms with no history of neuropathy.   Plan: Informed patient of next study assessments due in 4 weeks (+/- 2 weeks) and will be done on same day as treatment.  Plan for this visit on 11/17/19 since she is seeing provider on that day.  Thanked patient for participating in this study today. Encouraged her to call clinic if any questions or concerns prior to next appointment. She verbalized understanding. I plan on getting the 10 minute Taxane draw on her infusion on 8/30.    Carol Ada, RT(R)(T) Clinical Research Coordinator

## 2019-11-10 NOTE — Progress Notes (Signed)
Patient presents today for first time Paclitaxel (TAXOL). 10 minutes into her start rate, she started complaining of heartburn, SOB and lightheadedness. Infusion stopped, NS started at 999 ml/hr, Lucianne Lei, PA-SMC at chairside. Pepcid 20mg  IVPB and Mylanta 30 ml administered. Infusion restarted at 1412, patient tolerated the rest of the treatment without any complain or concerns. Written and verbal education provided, patient knows to call the clinic with any complaints or concerns.

## 2019-11-11 ENCOUNTER — Other Ambulatory Visit: Payer: Self-pay | Admitting: Hematology

## 2019-11-11 ENCOUNTER — Telehealth: Payer: Self-pay | Admitting: *Deleted

## 2019-11-11 NOTE — Telephone Encounter (Signed)
-----   Message from Georgianne Fick, RN sent at 11/10/2019  3:56 PM EDT ----- Regarding: Dr. Burr Medico First Time Chemo Patient received first time Paclitaxel today, she had a brief reaction but tolerated the rest of the infusion. Please see progress note for details.

## 2019-11-11 NOTE — Telephone Encounter (Signed)
Called pt to see how she did with her treatment yest.  She reports some h/a, & some joint pain but hasn't needed to take any pain med.  She has taken compazine but not nauseated.  Discussed nausea meds & to take as needed.  She knows to call with any concerns.

## 2019-11-11 NOTE — Progress Notes (Signed)
Pharmacist Chemotherapy Monitoring - Follow Up Assessment    I verify that I have reviewed each item in the below checklist:  . Regimen for the patient is scheduled for the appropriate day and plan matches scheduled date. Marland Kitchen Appropriate non-routine labs are ordered dependent on drug ordered. . If applicable, additional medications reviewed and ordered per protocol based on lifetime cumulative doses and/or treatment regimen.   Plan for follow-up and/or issues identified: Yes . I-vent associated with next due treatment: Yes . MD and/or nursing notified: Yes   Kennith Center, Pharm.D., CPP 11/11/2019@11 :07 AM

## 2019-11-13 ENCOUNTER — Other Ambulatory Visit: Payer: Self-pay

## 2019-11-13 ENCOUNTER — Ambulatory Visit: Payer: BC Managed Care – PPO | Attending: General Surgery

## 2019-11-13 DIAGNOSIS — M25512 Pain in left shoulder: Secondary | ICD-10-CM

## 2019-11-13 DIAGNOSIS — C50411 Malignant neoplasm of upper-outer quadrant of right female breast: Secondary | ICD-10-CM | POA: Diagnosis not present

## 2019-11-13 DIAGNOSIS — Z170 Estrogen receptor positive status [ER+]: Secondary | ICD-10-CM | POA: Insufficient documentation

## 2019-11-13 DIAGNOSIS — M25612 Stiffness of left shoulder, not elsewhere classified: Secondary | ICD-10-CM | POA: Insufficient documentation

## 2019-11-13 DIAGNOSIS — M25511 Pain in right shoulder: Secondary | ICD-10-CM

## 2019-11-13 DIAGNOSIS — R0789 Other chest pain: Secondary | ICD-10-CM | POA: Diagnosis present

## 2019-11-13 DIAGNOSIS — R293 Abnormal posture: Secondary | ICD-10-CM | POA: Insufficient documentation

## 2019-11-13 DIAGNOSIS — M25611 Stiffness of right shoulder, not elsewhere classified: Secondary | ICD-10-CM | POA: Diagnosis present

## 2019-11-13 NOTE — Patient Instructions (Addendum)
Flexion (Eccentric) - Active-Assist (Cane)          Cancer Rehab [phone]    Use unaffected arm to push affected arm forward. Avoid hiking shoulder (shoulder should NOT touch cheek). Keep palm relaxed. Slowly lower affected arm. Hold stretch for _5_ seconds repeating _5-10_ times, _1-2_ times a day.  Abduction (Eccentric) - Active-Assist (Cane)    Use unaffected arm to push affected arm out to side. Avoid hiking shoulder (shoulder should NOT touch cheek). Keep palm relaxed. Slowly lower affected arm. Hold stretch _5_ seconds repeating _5-10_ times, _1-2_ times a day.  Cane Exercise: Extension   Stand holding cane behind back with both hands palm-up. Lift the cane away from body until gentle stretch felt. Do NOT lean forward.  Hold __5__ seconds. Repeat _5-10___ times. Do _1-2___ sessions per day.    

## 2019-11-13 NOTE — Therapy (Signed)
[place], [place], [address] Phone: [phone]   Fax:  [phone]  Physical Therapy Treatment  Patient Details  Name: Stephanie Chase MRN: [id number] Date of Birth: 08-Apr-1960 Referring Provider (PT): Dr. Stark Klein   Encounter Date: 11/13/2019   PT End of Session - 11/13/19 1239    Visit Number 6    Number of Visits 18    Date for PT Re-Evaluation 12/11/19    PT Start Time 1108    PT Stop Time 1212    PT Time Calculation (min) 64 min    Activity Tolerance Patient tolerated treatment well    Behavior During Therapy [facility] for tasks assessed/performed           Past Medical History:  Diagnosis Date  . Anemia   . Anxiety   . Asthma   . Breast cancer ([place])   . COPD (chronic obstructive pulmonary disease) ([facility])   . Discoid lupus   . Family history of bladder cancer   . Family history of BRCA gene mutation   . Family history of breast cancer   . Family history of prostate cancer   . GERD (gastroesophageal reflux disease)   . Hypothyroidism   . Pneumonia   . Thyroid disease     Past Surgical History:  Procedure Laterality Date  . ABLATION ON ENDOMETRIOSIS    . APPLICATION OF A-CELL OF EXTREMITY Right 10/22/2019   Procedure: EXCISION OF RIGHT BREAST WOUND WITH PRIMARY CLOSURE;  Surgeon: [name] Going, DO;  Location: [facility];  Service: Plastics;  Laterality: Right;  . BREAST LUMPECTOMY    . DEBRIDEMENT AND CLOSURE WOUND Right 10/22/2019   Procedure: Excision of right breast wound;  Surgeon: [name] Going, DO;  Location: [facility];  Service: Plastics;  Laterality: Right;  . MASTECTOMY W/ SENTINEL NODE BIOPSY Bilateral 09/05/2019   Procedure: BILATERAL MASTECTOMY WITH RIGHT SENTINEL LYMPH NODE BIOPSY;  Surgeon: Stark Klein, MD;  Location: [facility];  Service: General;  Laterality: Bilateral;  COMBINED WITH REGIONAL FOR POST OP PAIN  . PORTACATH PLACEMENT Left 09/05/2019   Procedure:  INSERTION PORT-A-CATH WITH ULTRASOUND GUIDANCE;  Surgeon: Stark Klein, MD;  Location: [facility];  Service: General;  Laterality: Left;  . TONSILLECTOMY    . TUBAL LIGATION    . WISDOM TOOTH EXTRACTION      There were no vitals filed for this visit.   Subjective Assessment - 11/13/19 1110    Subjective I did end up having surgical debridement on 10/22/19 and we were able to start Herceptin after that, and this Monday Taxol with the Herceptin. So far so good except some ringing in my ears. The Rt side wound is doing much better and has closed up. No more drainage wither, except she is having me keep the bandage on until I go back to see Dr. Marla Roe 11/18/19.    Pertinent History Bil masectomy 09/05/19 with 3 lymph nodes removed and negative on the R and benign tisuse on the L. Patient was diagnosed on 06/09/2019 with right grade III invasive ductal carcinoma breast cancer. It mesures 1.3 cm in the upper outer quadrant with 1.6 cm of calcifications and DCIS. It is ER positive, PR negative, and HER2 positive with a Ki67 of 50%. She smokes about 1 pack/day.    Patient Stated Goals I want to get back to kayaking.    Currently in Pain? No/denies  [facility] Adult PT Treatment/Exercise - 11/13/19 0001      Shoulder Exercises: Pulleys   Flexion 2 minutes    Flexion Limitations Pt returned therapist demo and VCs briefly after     Scaption 2 minutes    Scaption Limitations Pt returned therapist demo and very good ROM with this      Shoulder Exercises: Therapy Ball   Flexion Both;5 reps   forward lean into end of stretch   ABduction Right;Left;5 reps   same side lean into end of stretch     Shoulder Exercises: Stretch   Other Shoulder Stretches With dowel for following: Rt (but pt to also do Lt at home) flexion, abduction and er to available ROM x3 each      Manual Therapy   Manual Therapy Passive ROM;Soft tissue mobilization    Soft tissue mobilization With  coconut oil to Rt pectoralis during P/ROM    Passive ROM In Supine to Rt shoulder into flexion, abduction and er to pts available motion. Rt shoulder depression througout with VCs to relax for increased comfort with stretch                    PT Short Term Goals - 09/25/19 1202      PT SHORT TERM GOAL #1   Title Pt will be independent with HEP and Modified MLD for the anterior trunk within 2 weeks in order to demonstrate autonomy of care.    Baseline pt provided with HEP today    Time 2    Period Weeks    Status New    Target Date 10/16/19             PT Long Term Goals - 11/13/19 1116      PT LONG TERM GOAL #1   Title Pt will demonstrate 150 degrees bil shoulder flexion and abduction within 4 weeks to demonstrate return to pre-operative AROM.    Baseline R shoulder flexion: 110, abduction: 69 L shoulder flexion: 118, abduction:  77; approximate AA/ROM with pulleys Rt 120 and abduction 140 degrees, Lt 150 degrees flexion and abduction (forgot to measure but this is visibly much improved since last measured)-11/13/19    Time 4    Period Weeks    Status On-going      PT LONG TERM GOAL #2   Title Pt will report 75% improvement in functional mobility and pain in order to demonstrate an improvement in subjective quality of life.    Baseline 2/10 pain, decresaed ROM below shoulder height. MOdifications with bathing/dressing; pt reports 50% improvement at this time as she is still limited with vaccuuming, lifting and reaching above head-11/13/19    Time 4    Period Weeks    Status Revised      PT LONG TERM GOAL #3   Title Pt to be independent with HEP for improved A/ROM for bil shoulders and bil UE and postural strength to improve ease with ADLs.    Baseline No current HEP though began this today as she now has clearance for ROM from surgeon-11/13/19    Time 4    Period Weeks    Status New                 Plan - 11/13/19 1240    Clinical Impression Statement Pt  returns to physical therapy today after having surgical debridement to her Rt mastectomy incision on 10/22/19. Was told by Dr. Marla Roe no OH reaching x1 week after then full  ROM okay without pain. So today continued with P/ROM and manual therapy focusing on pectoralis tightness along with STM to trigger points in Rt upper arm. Also progressed pt to include AA/ROM with pulleys and ball roll up wall and added standing dowel exercises encouraging pt to do these in front of mirror at home to assure proper technique/posture during. She returned good demo and verbalized good understanding. She has f/u appt with Dr. Marla Roe next week (11/18/19) and will see if okay to progress to manual therapy at incision. Renewal done today for 2x/wk x4 more weeks to continue progress towards improving end bil shoulders ROM, decrease fascial tightness at Rt pectoralis and add and help pt become independent with HEP for ROM and strength. Leone Payor, PT, assessed pt and her healing incision during session today.    Personal Factors and Comorbidities Comorbidity 1    Comorbidities bil masectomy with 3 lymph node removal on the R    Stability/Clinical Decision Making Stable/Uncomplicated    Rehab Potential Good    PT Frequency 2x / week    PT Duration 4 weeks    PT Treatment/Interventions Therapeutic activities;Therapeutic exercise;Neuromuscular re-education;Manual techniques;Cryotherapy;Moist Heat    PT Next Visit Plan How are new HEP exs? How was f/u with Dr. Marla Roe? Cont progressing bil shoulder end ROM, cont manual therapy to Rt>Lt pectoralis, and progress HEP as able    PT Home Exercise Plan Access Code: [facility]; standing dowel exercises    Consulted and Agree with Plan of Care Patient           Patient will benefit from skilled therapeutic intervention in order to improve the following deficits and impairments:  Decreased knowledge of precautions, Pain, Decreased range of motion, Increased edema  Visit  Diagnosis: Malignant neoplasm of upper-outer quadrant of right breast in female, estrogen receptor positive (HCC)  Stiffness of left shoulder, not elsewhere classified  Stiffness of right shoulder, not elsewhere classified  Acute pain of right shoulder  Acute pain of left shoulder  Sternal pain  Abnormal posture     Problem List Patient Active Problem List   Diagnosis Date Noted  . Acquired absence of breast and absent nipple, bilateral 10/07/2019  . Breast wound, right, initial encounter 10/07/2019  . Breast cancer of upper-outer quadrant of right female breast ([place]) 09/05/2019  . Genetic testing 08/12/2019  . Family history of BRCA gene mutation 07/31/2019  . Family history of breast cancer   . Family history of prostate cancer   . Family history of bladder cancer   . Malignant neoplasm of upper-outer quadrant of right breast in female, estrogen receptor positive ([place]) 07/24/2019  . Annual physical exam 03/31/2016  . Occupational bronchitis ([place]) 03/03/2015  . Hyperlipidemia 03/30/2014  . 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 Generalized osteoarthritis 02/23/2012  . Plantar fasciitis, bilateral 02/23/2012  . Vitiligo 02/23/2012  . Difficulty hearing 02/13/2012  . History of lupus 12/21/2011  . Vitamin D deficiency 10/11/2010  . Hypothyroidism 06/21/2010  . Systemic lupus erythematosus ([place]) 06/14/2010    Otelia Limes, PTA 11/13/2019, 12:49 PM  [place], [place], [address] Phone: [phone]   Fax:  [phone]  Name: Stephanie Chase MRN: [id number] Date of Birth: 1961/03/19

## 2019-11-14 ENCOUNTER — Other Ambulatory Visit: Payer: Self-pay

## 2019-11-14 ENCOUNTER — Inpatient Hospital Stay: Payer: BC Managed Care – PPO

## 2019-11-14 VITALS — BP 120/75 | HR 62 | Temp 98.0°F | Resp 18

## 2019-11-14 DIAGNOSIS — Z170 Estrogen receptor positive status [ER+]: Secondary | ICD-10-CM

## 2019-11-14 DIAGNOSIS — C50411 Malignant neoplasm of upper-outer quadrant of right female breast: Secondary | ICD-10-CM

## 2019-11-14 DIAGNOSIS — Z5112 Encounter for antineoplastic immunotherapy: Secondary | ICD-10-CM | POA: Diagnosis not present

## 2019-11-14 DIAGNOSIS — Z95828 Presence of other vascular implants and grafts: Secondary | ICD-10-CM

## 2019-11-14 LAB — CBC WITH DIFFERENTIAL (CANCER CENTER ONLY)
Abs Immature Granulocytes: 0.01 10*3/uL (ref 0.00–0.07)
Basophils Absolute: 0.1 10*3/uL (ref 0.0–0.1)
Basophils Relative: 1 %
Eosinophils Absolute: 0.1 10*3/uL (ref 0.0–0.5)
Eosinophils Relative: 1 %
HCT: 42.8 % (ref 36.0–46.0)
Hemoglobin: 14.1 g/dL (ref 12.0–15.0)
Immature Granulocytes: 0 %
Lymphocytes Relative: 30 %
Lymphs Abs: 2.2 10*3/uL (ref 0.7–4.0)
MCH: 28.6 pg (ref 26.0–34.0)
MCHC: 32.9 g/dL (ref 30.0–36.0)
MCV: 86.8 fL (ref 80.0–100.0)
Monocytes Absolute: 0.3 10*3/uL (ref 0.1–1.0)
Monocytes Relative: 4 %
Neutro Abs: 4.7 10*3/uL (ref 1.7–7.7)
Neutrophils Relative %: 64 %
Platelet Count: 294 10*3/uL (ref 150–400)
RBC: 4.93 MIL/uL (ref 3.87–5.11)
RDW: 12.8 % (ref 11.5–15.5)
WBC Count: 7.3 10*3/uL (ref 4.0–10.5)
nRBC: 0 % (ref 0.0–0.2)

## 2019-11-14 LAB — CMP (CANCER CENTER ONLY)
ALT: 80 U/L — ABNORMAL HIGH (ref 0–44)
AST: 50 U/L — ABNORMAL HIGH (ref 15–41)
Albumin: 4 g/dL (ref 3.5–5.0)
Alkaline Phosphatase: 128 U/L — ABNORMAL HIGH (ref 38–126)
Anion gap: 7 (ref 5–15)
BUN: 10 mg/dL (ref 6–20)
CO2: 27 mmol/L (ref 22–32)
Calcium: 10.8 mg/dL — ABNORMAL HIGH (ref 8.9–10.3)
Chloride: 103 mmol/L (ref 98–111)
Creatinine: 0.71 mg/dL (ref 0.44–1.00)
GFR, Est AFR Am: >60 mL/min (ref >60)
GFR, Estimated: >60 mL/min (ref >60)
Glucose, Bld: 89 mg/dL (ref 70–99)
Potassium: 4.4 mmol/L (ref 3.5–5.1)
Sodium: 137 mmol/L (ref 135–145)
Total Bilirubin: 0.3 mg/dL (ref 0.3–1.2)
Total Protein: 7.6 g/dL (ref 6.5–8.1)

## 2019-11-14 MED ORDER — SODIUM CHLORIDE 0.9% FLUSH
10.0000 mL | INTRAVENOUS | Status: DC | PRN
  Administered 2019-11-14: 10 mL via INTRAVENOUS
  Filled 2019-11-14: qty 10

## 2019-11-14 MED ORDER — HEPARIN SOD (PORK) LOCK FLUSH 100 UNIT/ML IV SOLN
500.0000 [IU] | Freq: Once | INTRAVENOUS | Status: AC
  Administered 2019-11-14: 500 [IU] via INTRAVENOUS
  Filled 2019-11-14: qty 5

## 2019-11-14 MED ORDER — SODIUM CHLORIDE 0.9 % IV SOLN
Freq: Once | INTRAVENOUS | Status: AC
  Filled 2019-11-14: qty 250

## 2019-11-14 NOTE — Patient Instructions (Signed)

## 2019-11-14 NOTE — Progress Notes (Signed)
[facility]   Telephone:([phone]) [phone] [phone]   Clinic Follow up Note   Patient Care Team: Delsa Bern, MD as PCP - General (Obstetrics and Gynecology) Richrd Prime as Consulting Physician (Obstetrics and Gynecology) Mauro Kaufmann, RN as Oncology Nurse Navigator Rockwell Germany, RN as Oncology Nurse Navigator Stark Klein, MD as Consulting Physician (General Surgery) Truitt Merle, MD as Consulting Physician (Hematology) Kyung Rudd, MD as Consulting Physician (Radiation Oncology) Register, Luetta Nutting, PA-C as Physician Assistant (Dermatology) [name] Going, DO as Attending Physician (Plastic Surgery)  Date of Service:  11/17/2019  CHIEF COMPLAINT: F/u of right breast cancer  SUMMARY OF ONCOLOGIC HISTORY: Oncology History Overview Note  Cancer Staging Malignant neoplasm of upper-outer quadrant of right breast in female, estrogen receptor positive ([place]) Staging form: Breast, AJCC 8th Edition - Clinical stage from 07/30/2019: Stage IA (cT1c, cN0, cM0, G3, ER+, PR-, HER2+) - Unsigned    Malignant neoplasm of upper-outer quadrant of right breast in female, estrogen receptor positive ([place])  07/03/2019 Mammogram   Diagnostic Mammogram  IMPRESSION There is a 1.3x1.1cm focal asymmetry in the retroareolar anterior to middle depth right breast 2.2cm from the nipple.  There is a 2 cm focal asymmetry with appearance in the upper outer quadrant posterior depth of right breast 3 cm from nipple -Both suspicious and biopsy recommended.     07/18/2019 Initial Biopsy   Diagnosis 07/18/19 1. Breast, right, needle core biopsy, 12 o'clock - INVASIVE DUCTAL CARCINOMA. 2. Breast, right, needle core biopsy, upper outer quadrant - DUCTAL CARCINOMA IN SITU. Microscopic Comment 1. The invasive carcinoma is nuclear grade 3. The greatest linear extent of tumor in any one core is 11 mm. Ancillary studies will be reported separately. CKAE1AE3, GATA-3, ER and E-cadherin are  positive. PAX 8 is negative. CK7 is noncontributory. 2. The in situ carcinoma is high nuclear grade with central necrosis and calcifications. E-cadherin is positive. P63, Calponin and SMM-1 demonstrate the present of myoepithelium.   07/18/2019 Receptors her2   1. PROGNOSTIC INDICATORS 07/18/19 Results: IMMUNOHISTOCHEMICAL AND MORPHOMETRIC ANALYSIS PERFORMED MANUALLY The tumor cells are POSITIVE for Her2 (3+). Of note, the tumor shows heterogeneity in regards to Her2 expression. Estrogen Receptor: 95%, POSITIVE, STRONG STAINING INTENSITY Progesterone Receptor: 0%, NEGATIVE Proliferation Marker Ki67: 50%   07/24/2019 Initial Diagnosis   Malignant neoplasm of upper-outer quadrant of right breast in female, estrogen receptor positive ([place])   08/01/2019 Breast MRI   IMPRESSION: 1. Known invasive ductal carcinoma measures 1.2 centimeters in the 12 o'clock location. 2. Non mass enhancement measures 2.3 centimeters in the LATERAL portion of the RIGHT breast corresponding to DCIS on biopsy. 3. An additional mass in the UPPER OUTER QUADRANT of the RIGHT breast is 1.5 centimeters and warrants tissue diagnosis. This area correlates with distortion seen mammographically. 4. No axillary adenopathy or ancillary findings. LEFT breast is negative.     08/04/2019 Echocardiogram   IMPRESSIONS     1. Left ventricular ejection fraction, by estimation, is 65 to 70%. The  left ventricle has normal function. The left ventricle has no regional  wall motion abnormalities. Left ventricular diastolic parameters were  normal. The average left ventricular  global longitudinal strain is -20.4 %.   2. Right ventricular systolic function is normal. The right ventricular  size is normal.   3. The mitral valve is normal in structure. No evidence of mitral valve  regurgitation. No evidence of mitral stenosis.   4. The aortic valve is normal in structure. Aortic valve regurgitation is  not visualized. No aortic  stenosis is present.    08/10/2019 Genetic Testing   Negative genetic testing:  No pathogenic variants detected on the [facility] Breast Cancer STAT panel + Common Hereditary Cancers panel. A variant of uncertain significance (VUS) was detected in the ATM gene called c.3834C>A. The report date is 08/10/2019.  The Breast Cancer STAT Panel offered by [facility] includes sequencing and deletion/duplication analysis for the following 9 genes:  ATM, BRCA1, BRCA2, CDH1, CHEK2, PALB2, PTEN, STK11 and TP53. The Common Hereditary Cancers Panel offered by [facility] includes sequencing and/or deletion duplication testing of the following 48 genes: APC, ATM, AXIN2, BARD1, BMPR1A, BRCA1, BRCA2, BRIP1, CDH1, CDK4, CDKN2A (p14ARF), CDKN2A (p16INK4a), CHEK2, CTNNA1, DICER1, EPCAM (Deletion/duplication testing only), GREM1 (promoter region deletion/duplication testing only), KIT, MEN1, MLH1, MSH2, MSH3, MSH6, MUTYH, NBN, NF1, NHTL1, PALB2, PDGFRA, PMS2, POLD1, POLE, PTEN, RAD50, RAD51C, RAD51D, RNF43, SDHB, SDHC, SDHD, SMAD4, SMARCA4. STK11, TP53, TSC1, TSC2, and VHL.  The following genes were evaluated for sequence changes only: SDHA and HOXB13 c.251G>A variant only.    09/05/2019 Surgery   BILATERAL MASTECTOMY WITH RIGHT SENTINEL LYMPH NODE BIOPSY and PAC placement by Dr Barry Dienes    09/05/2019 Pathology Results   FINAL MICROSCOPIC DIAGNOSIS:   A. BREAST, LEFT, MASTECTOMY:  - Benign breast tissue.   B. BREAST, RIGHT, MASTECTOMY:  - Invasive ductal carcinoma, multifocal, 1.4 cm in greatest dimension,  Nottingham grade 3 of 3.  - Ductal carcinoma in situ, high nuclear grade with central necrosis and  calcifications.  - Margins of resection are not involved.  - Biopsy sites.  - See oncology table.   C. SENTINEL LYMPH NODE, RIGHT AXILLARY #1, BIOPSY:  - One lymph node, negative for carcinoma (0/1).   D. SENTINEL LYMPH NODE, RIGHT AXILLARY #2, BIOPSY:  - One lymph node, negative for carcinoma (0/1).   E. SENTINEL LYMPH  NODE, RIGHT AXILLARY #3, BIOPSY:  - One lymph node, negative for carcinoma (0/1).     Estrogen Receptor: 95%, positive, strong             Progesterone Receptor: 0%, negative             HER2: Positive (3+)             Ki-67: 50%    09/05/2019 Cancer Staging   Staging form: Breast, AJCC 8th Edition - Pathologic stage from 09/05/2019: Stage IA (pT1c, pN0, cM0, G3, ER+, PR-, HER2+) - Signed by Gardenia Phlegm, NP on 09/17/2019   10/22/2019 Surgery   Excision of right breast wound and APPLICATION OF A-CELL, INTEGRA BWM OR CELLERATE by Dr Marla Roe on 10/22/19   10/27/2019 -  Chemotherapy   Weekly Taxol and Herceptin for 12 weeks starting 10/27/19 then maintenance Herceptin q3weeks to complete 1 year treatment. Given recent right breast infection and debridement, will start Herceptin on 10/27/19 with loading dose (q2weeks) and add Taxol with week 3 on 11/10/19 then continuing with both weekly.       CURRENT THERAPY:  Weekly Taxol and Herceptin for 12 weeks starting 10/27/19 then maintenance Herceptin q3weeks to complete 1 year treatment.   INTERVAL HISTORY:  Stephanie Chase is here for a follow up and treatment. She presents to the clinic alone. She notes her first cycle Taxol due to heartburn. She was able to pause and restart and complete. She notes the week after she felt well except for heartburn through out the week. She also notes intermittent right hip pain since infusion. This is mild pain with no  true exacerbation. She notes since starting Herceptin she has Tinnitus constantly. She notes she currently has consistent leave of work and would like to have intermittent leave of work. She plans to see Dr Marla Roe tomorrow.   She is interested in seeing her mother in [place]. She has received both her COVID19 vaccine. She notes she can wait until after her chemo.    REVIEW OF SYSTEMS:   Constitutional: Denies fevers, chills or abnormal weight loss Eyes: Denies blurriness  of vision Ears, nose, mouth, throat, and face: Denies mucositis or sore throat (+) Tinnitus Respiratory: Denies cough, dyspnea or wheezes Cardiovascular: Denies palpitation, chest discomfort or lower extremity swelling Gastrointestinal:  Denies nausea or change in bowel habits (+) Heartburn MSK: (+) intermittent hip pain  Skin: Denies abnormal skin rashes Lymphatics: Denies new lymphadenopathy or easy bruising Neurological:Denies numbness, tingling or new weaknesses Behavioral/Psych: Mood is stable, no new changes  All other systems were reviewed with the patient and are negative.  MEDICAL HISTORY:  Past Medical History:  Diagnosis Date  . Anemia   . Anxiety   . Asthma   . Breast cancer ([place])   . COPD (chronic obstructive pulmonary disease) ([place])   . Discoid lupus   . Family history of bladder cancer   . Family history of BRCA gene mutation   . Family history of breast cancer   . Family history of prostate cancer   . GERD (gastroesophageal reflux disease)   . Hypothyroidism   . Pneumonia   . Thyroid disease     SURGICAL HISTORY: Past Surgical History:  Procedure Laterality Date  . ABLATION ON ENDOMETRIOSIS    . APPLICATION OF A-CELL OF EXTREMITY Right 10/22/2019   Procedure: EXCISION OF RIGHT BREAST WOUND WITH PRIMARY CLOSURE;  Surgeon: [name] Going, DO;  Location: [facility];  Service: Plastics;  Laterality: Right;  . BREAST LUMPECTOMY    . DEBRIDEMENT AND CLOSURE WOUND Right 10/22/2019   Procedure: Excision of right breast wound;  Surgeon: [name] Going, DO;  Location: [facility];  Service: Plastics;  Laterality: Right;  . MASTECTOMY W/ SENTINEL NODE BIOPSY Bilateral 09/05/2019   Procedure: BILATERAL MASTECTOMY WITH RIGHT SENTINEL LYMPH NODE BIOPSY;  Surgeon: Stark Klein, MD;  Location: [facility];  Service: General;  Laterality: Bilateral;  COMBINED WITH REGIONAL FOR POST OP PAIN  . PORTACATH PLACEMENT Left 09/05/2019   Procedure: INSERTION PORT-A-CATH WITH ULTRASOUND  GUIDANCE;  Surgeon: Stark Klein, MD;  Location: [facility];  Service: General;  Laterality: Left;  . TONSILLECTOMY    . TUBAL LIGATION    . WISDOM TOOTH EXTRACTION      I have reviewed the social history and family history with the patient and they are unchanged from previous note.  ALLERGIES:  is allergic to bactrim [sulfamethoxazole-trimethoprim].  MEDICATIONS:  Current Outpatient Medications  Medication Sig Dispense Refill  . acetaminophen (TYLENOL) 500 MG tablet Take 500-1,000 mg by mouth every 6 (six) hours as needed for moderate pain.     . Albuterol Sulfate (PROAIR RESPICLICK) 425 (90 Base) MCG/ACT AEPB Inhale 2 puffs into the lungs every 6 (six) hours as needed. (Patient taking differently: Inhale 2 puffs into the lungs every 6 (six) hours as needed (SOB). ) 1 each 3  . ALPRAZolam (XANAX) 0.25 MG tablet Take 1 tablet (0.25 mg total) by mouth daily as needed for anxiety. 30 tablet 0  . Ascorbic Acid (VITAMIN C) 1000 MG tablet Take 1,000 mg by mouth daily.    . cetirizine (ZYRTEC) 10  MG tablet Take 10 mg by mouth daily as needed for allergies.     . Cholecalciferol (VITAMIN D) 50 MCG (2000 UT) tablet Take 2,000 Units by mouth daily.    Marland Kitchen dextromethorphan (DELSYM) 30 MG/5ML liquid Take 60 mg by mouth 2 (two) times daily as needed for cough.    . fluticasone (FLONASE) 50 MCG/ACT nasal spray Place 1 spray into both nostrils daily as needed for allergies.     Marland Kitchen levothyroxine (SYNTHROID) 75 MCG tablet Take 75 mcg by mouth daily before breakfast.    . lidocaine-prilocaine (EMLA) cream Apply 1 application topically as needed. (Patient taking differently: Apply 1 application topically daily as needed (port access). ) 30 g 1  . liothyronine (CYTOMEL) 5 MCG tablet TAKE 2 TABLETS (=10MCG     TOTAL)     DAILY (Patient taking differently: Take 10 mcg by mouth daily. ) 180 tablet 1  . methocarbamol (ROBAXIN) 500 MG tablet Take 1 tablet (500 mg total) by mouth every 6 (six) hours as needed (use for  muscle cramps/pain). 20 tablet 1  . Multiple Vitamin (MULTIVITAMIN WITH MINERALS) TABS tablet Take 1 tablet by mouth 2 (two) times daily.    . nicotine (NICODERM CQ - DOSED IN MG/24 HOURS) 21 mg/24hr patch Place 21 mg onto the skin daily.    . ondansetron (ZOFRAN) 8 MG tablet Take 1 tablet (8 mg total) by mouth 2 (two) times daily as needed (Nausea or vomiting). 30 tablet 1  . OVER THE COUNTER MEDICATION Take 1 each by mouth 2 (two) times daily as needed (anxiety / sleep). CBD Gummies    . oxyCODONE (OXY IR/ROXICODONE) 5 MG immediate release tablet Take 1 tablet (5 mg total) by mouth every 6 (six) hours as needed for breakthrough pain. 20 tablet 0  . prochlorperazine (COMPAZINE) 10 MG tablet Take 1 tablet (10 mg total) by mouth every 6 (six) hours as needed (Nausea or vomiting). 30 tablet 1  . Zinc Sulfate 220 (50 Zn) MG TABS TAKE 1 TABLET (220 MG TOTAL) BY MOUTH IN THE MORNING AND AT BEDTIME. 60 tablet 0   No current facility-administered medications for this visit.   Facility-Administered Medications Ordered in Other Visits  Medication Dose Route Frequency Provider Last Rate Last Admin  . alum & mag hydroxide-simeth (MAALOX/MYLANTA) 200-200-20 MG/5ML suspension 30 mL  30 mL Oral Once Harle Stanford., PA-C        PHYSICAL EXAMINATION: ECOG PERFORMANCE STATUS: 1 - Symptomatic but completely ambulatory  Vitals:   11/17/19 1145  BP: 117/90  Pulse: 73  Resp: 18  Temp: 97.6 F (36.4 C)  SpO2: 100%   Filed Weights   11/17/19 1145  Weight: 159 lb 3.2 oz (72.2 kg)    GENERAL:alert, no distress and comfortable SKIN: skin color, texture, turgor are normal, no rashes or significant lesions EYES: normal, Conjunctiva are pink and non-injected, sclera clear NECK: supple, thyroid normal size, non-tender, without nodularity LYMPH:  no palpable lymphadenopathy in the cervical, axillary  LUNGS: clear to auscultation and percussion with normal breathing effort HEART: regular rate & rhythm and no  murmurs and no lower extremity edema ABDOMEN:abdomen soft, non-tender and normal bowel sounds Musculoskeletal:no cyanosis of digits and no clubbing  NEURO: alert & oriented x 3 with fluent speech, no focal motor/sensory deficits Breast: s/p right mastectomy, surgical incision has healed well   LABORATORY DATA:  I have reviewed the data as listed CBC Latest Ref Rng & Units 11/17/2019 11/14/2019 11/10/2019  WBC 4.0 - 10.5  K/uL 9.7 7.3 9.3  Hemoglobin 12.0 - 15.0 g/dL 13.4 14.1 13.3  Hematocrit 36 - 46 % 40.5 42.8 40.2  Platelets 150 - 400 K/uL 288 294 264     CMP Latest Ref Rng & Units 11/17/2019 11/14/2019 11/10/2019  Glucose 70 - 99 mg/dL 85 89 89  BUN 6 - 20 mg/dL '11 10 7  ' Creatinine 0.44 - 1.00 mg/dL 0.69 0.71 0.71  Sodium 135 - 145 mmol/L 137 137 138  Potassium 3.5 - 5.1 mmol/L 4.3 4.4 4.2  Chloride 98 - 111 mmol/L 104 103 106  CO2 22 - 32 mmol/L 27 27 21(L)  Calcium 8.9 - 10.3 mg/dL 10.8(H) 10.8(H) 10.5(H)  Total Protein 6.5 - 8.1 g/dL 7.5 7.6 7.2  Total Bilirubin 0.3 - 1.2 mg/dL 0.3 0.3 0.3  Alkaline Phos 38 - 126 U/L 132(H) 128(H) 111  AST 15 - 41 U/L 50(H) 50(H) 33  ALT 0 - 44 U/L 75(H) 80(H) 31      RADIOGRAPHIC STUDIES: I have personally reviewed the radiological images as listed and agreed with the findings in the report. No results found.   ASSESSMENT & PLAN:  Kinaya Hilliker is a 59 y.o. female with    1.Malignant neoplasm of upper-outer quadrant of right breast,invasive ductal carcinoma and DCIS,StageIA, pT1cN0M0,ER+/HER2+, PR-,Grade III -She was diagnosed in 06/2019.She was found to have 2 foci in right breast,1.6cmDCIS in UOQ of and the 1.3cminvasive ductal carcinoma at 12:00 position.Left breast negative on Breast MRI.  -Given multifocal disease, she underwent right mastectomy and SLNB by Dr Barry Dienes on 09/05/19. Path showedmultifocal disease in right breast, largest 1.4cm, which were found to be high grade invasive ductal carcinoma with components of  DCIS. Her margins were clear and she was node negative. Her left breast tissue was benign.  -Given her HER2 positive high grade disease, adjuvant chemotherapy is recommended to reduce her high risk of recurrence. I recommend Weekly paclitaxel and trastuzumab for 12 weeks, followed by maintenance trastuzumab q3weeks to complete 1 year treatment. The goal of chemo is curative.  -She had PAC placed with surgery.  -Due to recent mastectomy wound, she started Herceptin on 10/27/19 with loading dose on week 1 and 3 and added Taxol to week 3 on 11/10/19. Will continue both weekly.  -She notes since restarting Kanjinti she has developed persistent mild Tinnitus. She notes with the addition of Taxol she has intermittent, mild right hip pain and has heartburn. I encouraged her to use PPI for her heartburn. If she has any further reaction to Taxol will switch to Abraxane.   -I will hold Kanjinti today and look into other bio-similar drug to switch to. She is agreeable.  -Labs reviewed and adequate to proceed with Taxol alone today.  -F/u in 2 weeks. -She has received both her COVID19 vaccine. She is fine to proceed with COVID booster.    2. Genetic Testing negative for pathogenetic mutations -Testing results did show A variant of uncertain significance (VUS) was detected in the ATM gene called c.3834C>A. The report date is 08/10/2019.She can repeat testing in 5-10 years.  3. Smoking Cessation, COPD -She has been smoking since the 2nd grade. She has been able to cut down to 1/2ppd recently. She notes she is willing to further reduce smoking. I discussed smoking risk and smoking cessation with her. -Per pt at some point she may was diagnosed with COPD or Emphysema. She is on albuterol inhaler and Singulair. -She has reduced smoking to 5-6 cigarettes a day. I encouraged her  to continue to wean down until she stops.   4. Comorbidities:DiscoidLupus, Vitiligo, Thyroid disease.  -She was diagnosed with Lupus  10 years ago. She uses steroid cream as needed for her lupus rashes, some skin lesions of which have been removed. -She sees her dermatologist Dr Nevada Crane and their [place].  -She is on 2 thyroid medications.  5. Social Support, Anxiety  -She lives alone but has good support from her 2 daughters but they live out of town. She notes it is fine to have both her daughter be aware of her medical condition and are the first to contact. She has a boyfriend Harrie Jeans who would be later to contact.  -She is considering using short or long term disability during chemo so she can be off work.  -Her daughter notes the patient has anxiety or panic attacks when she gets overwhelmed or disappointed. -Given initial anxiety Ipreviouslystarted her on low doseXanaxPRN(07/30/19).She can take Xanax before infusions. I noted to watch for drowsiness. -Ipreviouslyoffered her the chance to speak with out SW, she declined.  6.Right mastectomy wound  -it's healed well now   7. Mild Transminitis  -Her 11/14/19 labs, she had CA 10.8 and mild Transaminitis. I discussed this could be from chemo. -I recommend she avoid alcohol while on chemo and not use too much Tylenol.  -LFTs stable today  -continue monitoring    PLAN: -I refilled Xanax for her  -Labs reviewed and adequate to proceed with Taxol today. Will hold Kanjinti today and switch to other bio-similar next week due to Tinnitus  -Lab, flush, and Taxol and trastuzumab weekly  -F/u in 2, 4, 6, 8 weeks   No problem-specific Assessment & Plan notes found for this encounter.   Orders Placed This Encounter  Procedures  . CA 27.29    Standing Status:   Standing    Number of Occurrences:   10    Standing Expiration Date:   11/16/2020   All questions were answered. The patient knows to call the clinic with any problems, questions or concerns. No barriers to learning was detected. The total time spent in the appointment was 30 minutes.     Truitt Merle, MD 11/18/2019   I, Joslyn Devon, am acting as scribe for Truitt Merle, MD.   I have reviewed the above documentation for accuracy and completeness, and I agree with the above.

## 2019-11-14 NOTE — Patient Instructions (Signed)

## 2019-11-14 NOTE — Progress Notes (Signed)
    DATE:  11/10/2019                                          X  CHEMO/IMMUNOTHERAPY REACTION            MD:  Dr. Truitt Merle   AGENT/BLOOD PRODUCT RECEIVING TODAY:               Paclitaxel and Herceptin   AGENT/BLOOD PRODUCT RECEIVING IMMEDIATELY PRIOR TO REACTION:           Paclitaxel   VS: BP:      139/89   P:        72       SPO2:        100% on room air                  REACTION(S):            Dizziness and GERD   PREMEDS:      Claritin, Tylenol, dexamethasone, and Pepcid   INTERVENTION: Paclitaxel was paused and the patient was given Pepcid 20 mg IV x1 and Mylanta 30 mL p.o. x1.   Review of Systems  Review of Systems  Constitutional: Negative for chills, diaphoresis and fever.  HENT: Negative for trouble swallowing and voice change.   Respiratory: Negative for cough, chest tightness, shortness of breath and wheezing.        GERD  Cardiovascular: Negative for chest pain and palpitations.  Gastrointestinal: Negative for abdominal pain, constipation, diarrhea, nausea and vomiting.  Musculoskeletal: Negative for back pain and myalgias.  Neurological: Positive for dizziness. Negative for light-headedness and headaches.     Physical Exam  Physical Exam Constitutional:      General: She is not in acute distress.    Appearance: She is not diaphoretic.  HENT:     Head: Normocephalic and atraumatic.  Cardiovascular:     Rate and Rhythm: Normal rate and regular rhythm.     Heart sounds: Normal heart sounds. No murmur heard.  No friction rub. No gallop.   Pulmonary:     Effort: Pulmonary effort is normal. No respiratory distress.     Breath sounds: Normal breath sounds. No wheezing or rales.  Skin:    General: Skin is warm and dry.     Findings: No erythema or rash.  Neurological:     Mental Status: She is alert.     OUTCOME:                 The patient's symptoms resolved after she was given Pepcid 20 mg IV x1 and Mylanta 30 mils p.o. x1.  Paclitaxel was restarted and  completed.  The patient then proceeded with completion of Herceptin without any further issues of concern.   Sandi Mealy, MHS, PA-C

## 2019-11-17 ENCOUNTER — Telehealth: Payer: Self-pay | Admitting: Hematology

## 2019-11-17 ENCOUNTER — Inpatient Hospital Stay: Payer: BC Managed Care – PPO

## 2019-11-17 ENCOUNTER — Inpatient Hospital Stay (HOSPITAL_BASED_OUTPATIENT_CLINIC_OR_DEPARTMENT_OTHER): Payer: BC Managed Care – PPO | Admitting: Hematology

## 2019-11-17 ENCOUNTER — Other Ambulatory Visit: Payer: Self-pay

## 2019-11-17 ENCOUNTER — Encounter: Payer: Self-pay | Admitting: *Deleted

## 2019-11-17 VITALS — BP 117/90 | HR 73 | Temp 97.6°F | Resp 18 | Ht 65.0 in | Wt 159.2 lb

## 2019-11-17 DIAGNOSIS — C50411 Malignant neoplasm of upper-outer quadrant of right female breast: Secondary | ICD-10-CM | POA: Diagnosis not present

## 2019-11-17 DIAGNOSIS — Z170 Estrogen receptor positive status [ER+]: Secondary | ICD-10-CM

## 2019-11-17 DIAGNOSIS — Z95828 Presence of other vascular implants and grafts: Secondary | ICD-10-CM

## 2019-11-17 DIAGNOSIS — Z5112 Encounter for antineoplastic immunotherapy: Secondary | ICD-10-CM | POA: Diagnosis not present

## 2019-11-17 LAB — CBC WITH DIFFERENTIAL (CANCER CENTER ONLY)
Abs Immature Granulocytes: 0.07 10*3/uL (ref 0.00–0.07)
Basophils Absolute: 0.1 10*3/uL (ref 0.0–0.1)
Basophils Relative: 1 %
Eosinophils Absolute: 0.1 10*3/uL (ref 0.0–0.5)
Eosinophils Relative: 1 %
HCT: 40.5 % (ref 36.0–46.0)
Hemoglobin: 13.4 g/dL (ref 12.0–15.0)
Immature Granulocytes: 1 %
Lymphocytes Relative: 26 %
Lymphs Abs: 2.6 10*3/uL (ref 0.7–4.0)
MCH: 28.2 pg (ref 26.0–34.0)
MCHC: 33.1 g/dL (ref 30.0–36.0)
MCV: 85.3 fL (ref 80.0–100.0)
Monocytes Absolute: 0.6 10*3/uL (ref 0.1–1.0)
Monocytes Relative: 6 %
Neutro Abs: 6.4 10*3/uL (ref 1.7–7.7)
Neutrophils Relative %: 65 %
Platelet Count: 288 10*3/uL (ref 150–400)
RBC: 4.75 MIL/uL (ref 3.87–5.11)
RDW: 12.8 % (ref 11.5–15.5)
WBC Count: 9.7 10*3/uL (ref 4.0–10.5)
nRBC: 0 % (ref 0.0–0.2)

## 2019-11-17 LAB — CMP (CANCER CENTER ONLY)
ALT: 75 U/L — ABNORMAL HIGH (ref 0–44)
AST: 50 U/L — ABNORMAL HIGH (ref 15–41)
Albumin: 4 g/dL (ref 3.5–5.0)
Alkaline Phosphatase: 132 U/L — ABNORMAL HIGH (ref 38–126)
Anion gap: 6 (ref 5–15)
BUN: 11 mg/dL (ref 6–20)
CO2: 27 mmol/L (ref 22–32)
Calcium: 10.8 mg/dL — ABNORMAL HIGH (ref 8.9–10.3)
Chloride: 104 mmol/L (ref 98–111)
Creatinine: 0.69 mg/dL (ref 0.44–1.00)
GFR, Est AFR Am: >60 mL/min (ref >60)
GFR, Estimated: >60 mL/min (ref >60)
Glucose, Bld: 85 mg/dL (ref 70–99)
Potassium: 4.3 mmol/L (ref 3.5–5.1)
Sodium: 137 mmol/L (ref 135–145)
Total Bilirubin: 0.3 mg/dL (ref 0.3–1.2)
Total Protein: 7.5 g/dL (ref 6.5–8.1)

## 2019-11-17 MED ORDER — SODIUM CHLORIDE 0.9 % IV SOLN
Freq: Once | INTRAVENOUS | Status: AC
  Filled 2019-11-17: qty 250

## 2019-11-17 MED ORDER — SODIUM CHLORIDE 0.9 % IV SOLN
40.0000 mg | Freq: Once | INTRAVENOUS | Status: AC
  Administered 2019-11-17: 40 mg via INTRAVENOUS
  Filled 2019-11-17: qty 4

## 2019-11-17 MED ORDER — ALPRAZOLAM 0.25 MG PO TABS: 0.2500 mg | ORAL_TABLET | Freq: Every day | ORAL | 0 refills | Status: DC | PRN

## 2019-11-17 MED ORDER — SODIUM CHLORIDE 0.9% FLUSH
10.0000 mL | INTRAVENOUS | Status: DC | PRN
  Administered 2019-11-17: 10 mL via INTRAVENOUS
  Filled 2019-11-17: qty 10

## 2019-11-17 MED ORDER — ACETAMINOPHEN 325 MG PO TABS
650.0000 mg | ORAL_TABLET | Freq: Once | ORAL | Status: AC
  Administered 2019-11-17: 650 mg via ORAL

## 2019-11-17 MED ORDER — DIPHENHYDRAMINE HCL 50 MG/ML IJ SOLN
INTRAMUSCULAR | Status: AC
  Filled 2019-11-17: qty 1

## 2019-11-17 MED ORDER — SODIUM CHLORIDE 0.9 % IV SOLN
80.0000 mg/m2 | Freq: Once | INTRAVENOUS | Status: AC
  Administered 2019-11-17: 144 mg via INTRAVENOUS
  Filled 2019-11-17: qty 24

## 2019-11-17 MED ORDER — ACETAMINOPHEN 325 MG PO TABS
ORAL_TABLET | ORAL | Status: AC
  Filled 2019-11-17: qty 2

## 2019-11-17 MED ORDER — SODIUM CHLORIDE 0.9% FLUSH
10.0000 mL | INTRAVENOUS | Status: DC | PRN
  Administered 2019-11-17: 10 mL
  Filled 2019-11-17: qty 10

## 2019-11-17 MED ORDER — HEPARIN SOD (PORK) LOCK FLUSH 100 UNIT/ML IV SOLN
500.0000 [IU] | Freq: Once | INTRAVENOUS | Status: AC | PRN
  Administered 2019-11-17: 500 [IU]
  Filled 2019-11-17: qty 5

## 2019-11-17 MED ORDER — SODIUM CHLORIDE 0.9 % IV SOLN
20.0000 mg | Freq: Once | INTRAVENOUS | Status: AC
  Administered 2019-11-17: 20 mg via INTRAVENOUS
  Filled 2019-11-17: qty 20

## 2019-11-17 MED ORDER — DIPHENHYDRAMINE HCL 50 MG/ML IJ SOLN
25.0000 mg | Freq: Once | INTRAMUSCULAR | Status: AC
  Administered 2019-11-17: 25 mg via INTRAVENOUS

## 2019-11-17 NOTE — Telephone Encounter (Signed)
Scheduled per los. Declined perinout

## 2019-11-17 NOTE — Patient Instructions (Signed)
[facility] Discharge Instructions for Patients Receiving Chemotherapy  Today you received the following chemotherapy agents:  Taxol.  To help prevent nausea and vomiting after your treatment, we encourage you to take your nausea medication as directed.   If you develop nausea and vomiting that is not controlled by your nausea medication, call the clinic.   BELOW ARE SYMPTOMS THAT SHOULD BE REPORTED IMMEDIATELY:  *FEVER GREATER THAN 100.5 F  *CHILLS WITH OR WITHOUT FEVER  NAUSEA AND VOMITING THAT IS NOT CONTROLLED WITH YOUR NAUSEA MEDICATION  *UNUSUAL SHORTNESS OF BREATH  *UNUSUAL BRUISING OR BLEEDING  TENDERNESS IN MOUTH AND THROAT WITH OR WITHOUT PRESENCE OF ULCERS  *URINARY PROBLEMS  *BOWEL PROBLEMS  UNUSUAL RASH Items with * indicate a potential emergency and should be followed up as soon as possible.  Feel free to call the clinic should you have any questions or concerns. The clinic phone number is [phone].  Please show the CHEMO ALERT CARD at check-in to the Emergency Department and triage nurse.   

## 2019-11-18 ENCOUNTER — Encounter: Payer: Self-pay | Admitting: Hematology

## 2019-11-18 ENCOUNTER — Encounter: Payer: Self-pay | Admitting: Plastic Surgery

## 2019-11-18 ENCOUNTER — Ambulatory Visit (INDEPENDENT_AMBULATORY_CARE_PROVIDER_SITE_OTHER): Payer: BC Managed Care – PPO | Admitting: Plastic Surgery

## 2019-11-18 VITALS — BP 138/86 | HR 96 | Temp 98.5°F

## 2019-11-18 DIAGNOSIS — S21001A Unspecified open wound of right breast, initial encounter: Secondary | ICD-10-CM

## 2019-11-18 NOTE — Progress Notes (Signed)
   Subjective:    Patient ID: Stephanie Chase, female    DOB: 1960-05-10, 59 y.o.   MRN: [id number]  The patient is a 59 year old female here for follow-up on her right breast wound.  We did revision excision and closure.  Right now it looks really good and the skin is healing nicely.  There is no sign of infection.  She is still smoking about 10 cigarettes/day.     Review of Systems  Constitutional: Negative.   HENT: Negative.   Eyes: Negative.   Respiratory: Negative.   Cardiovascular: Negative.   Gastrointestinal: Negative.   Endocrine: Negative.   Genitourinary: Negative.   Hematological: Negative.        Objective:   Physical Exam Vitals and nursing note reviewed.  Constitutional:      Appearance: Normal appearance.  HENT:     Head: Normocephalic and atraumatic.  Cardiovascular:     Rate and Rhythm: Normal rate.     Pulses: Normal pulses.  Pulmonary:     Effort: Pulmonary effort is normal.  Neurological:     General: No focal deficit present.     Mental Status: She is alert and oriented to person, place, and time.  Psychiatric:        Mood and Affect: Mood normal.        Behavior: Behavior normal.       Assessment & Plan:     ICD-10-CM   1. Breast wound, right, initial encounter  S21.001A      Continue to limit tobacco use.  Can increase range of motion of the right arm and PT over the next several weeks.  Keep it covered or at least a sports bra on the area.  I removed a couple of stitches today.  The rest will come out on their own.

## 2019-11-18 NOTE — Addendum Note (Signed)
Addended by: Arbutus Ped C on: 11/18/2019 07:16 AM   Modules accepted: Orders

## 2019-11-19 ENCOUNTER — Other Ambulatory Visit: Payer: Self-pay

## 2019-11-19 ENCOUNTER — Ambulatory Visit: Payer: BC Managed Care – PPO

## 2019-11-19 DIAGNOSIS — M25611 Stiffness of right shoulder, not elsewhere classified: Secondary | ICD-10-CM

## 2019-11-19 DIAGNOSIS — Z170 Estrogen receptor positive status [ER+]: Secondary | ICD-10-CM

## 2019-11-19 DIAGNOSIS — R0789 Other chest pain: Secondary | ICD-10-CM

## 2019-11-19 DIAGNOSIS — R293 Abnormal posture: Secondary | ICD-10-CM

## 2019-11-19 DIAGNOSIS — M25612 Stiffness of left shoulder, not elsewhere classified: Secondary | ICD-10-CM

## 2019-11-19 DIAGNOSIS — M25511 Pain in right shoulder: Secondary | ICD-10-CM

## 2019-11-19 DIAGNOSIS — M25512 Pain in left shoulder: Secondary | ICD-10-CM

## 2019-11-19 DIAGNOSIS — C50411 Malignant neoplasm of upper-outer quadrant of right female breast: Secondary | ICD-10-CM

## 2019-11-19 NOTE — Therapy (Signed)
[place], [place], [address] Phone: [phone]   Fax:  [phone]  Physical Therapy Treatment  Patient Details  Name: Stephanie Chase MRN: [id number] Date of Birth: 04-09-1960 Referring Provider (PT): Dr. Stark Klein   Encounter Date: 11/19/2019   PT End of Session - 11/19/19 1004    Visit Number 7    Number of Visits 18    Date for PT Re-Evaluation 12/11/19    PT Start Time 0908    PT Stop Time 1002    PT Time Calculation (min) 54 min    Activity Tolerance Patient tolerated treatment well    Behavior During Therapy [facility] for tasks assessed/performed           Past Medical History:  Diagnosis Date  . Anemia   . Anxiety   . Asthma   . Breast cancer ([place])   . COPD (chronic obstructive pulmonary disease) ([place])   . Discoid lupus   . Family history of bladder cancer   . Family history of BRCA gene mutation   . Family history of breast cancer   . Family history of prostate cancer   . GERD (gastroesophageal reflux disease)   . Hypothyroidism   . Pneumonia   . Thyroid disease     Past Surgical History:  Procedure Laterality Date  . ABLATION ON ENDOMETRIOSIS    . APPLICATION OF A-CELL OF EXTREMITY Right 10/22/2019   Procedure: EXCISION OF RIGHT BREAST WOUND WITH PRIMARY CLOSURE;  Surgeon: [name] Going, DO;  Location: [facility];  Service: Plastics;  Laterality: Right;  . BREAST LUMPECTOMY    . DEBRIDEMENT AND CLOSURE WOUND Right 10/22/2019   Procedure: Excision of right breast wound;  Surgeon: [name] Going, DO;  Location: [facility];  Service: Plastics;  Laterality: Right;  . MASTECTOMY W/ SENTINEL NODE BIOPSY Bilateral 09/05/2019   Procedure: BILATERAL MASTECTOMY WITH RIGHT SENTINEL LYMPH NODE BIOPSY;  Surgeon: Stark Klein, MD;  Location: [facility];  Service: General;  Laterality: Bilateral;  COMBINED WITH REGIONAL FOR POST OP PAIN  . PORTACATH PLACEMENT Left 09/05/2019   Procedure:  INSERTION PORT-A-CATH WITH ULTRASOUND GUIDANCE;  Surgeon: Stark Klein, MD;  Location: [facility];  Service: General;  Laterality: Left;  . TONSILLECTOMY    . TUBAL LIGATION    . WISDOM TOOTH EXTRACTION      There were no vitals filed for this visit.   Subjective Assessment - 11/19/19 0915    Subjective I saw Dr. Marla Roe yesterday and she was very pleased with my healing. She wants me to wait another 2 weeks for strengthening and working on my incision because that'll be my 6 week. They had to hold herceptin for now because I started having bad tinitits. I was able to wash my floors last night. That went fine with reaching and supporting myself. I do still feel limited with things like vaccuuming still, causes alot of pull across my chest.    Pertinent History Bil masectomy 09/05/19 with 3 lymph nodes removed and negative on the R and benign tisuse on the L. Patient was diagnosed on 06/09/2019 with right grade III invasive ductal carcinoma breast cancer. It mesures 1.3 cm in the upper outer quadrant with 1.6 cm of calcifications and DCIS. It is ER positive, PR negative, and HER2 positive with a Ki67 of 50%. She smokes about 1 pack/day.    Patient Stated Goals I want to get back to kayaking.    Currently in Pain? No/denies  [facility] PT Treatment/Exercise - 11/19/19 0001      Shoulder Exercises: Pulleys   Flexion 2 minutes    Flexion Limitations Min VCs to relax shoulders then able to return correct technique    Scaption 2 minutes      Shoulder Exercises: Therapy Ball   Flexion Both;5 reps   forward lean into end of stretch   ABduction Right;Left;5 reps   same side lean into end of stretch     Shoulder Exercises: Stretch   Other Shoulder Stretches Modified downward dog on wall 5x, 5 sec holds returning therapist demo      Manual Therapy   Manual Therapy Passive ROM;Soft tissue mobilization;Myofascial release    Soft tissue mobilization With  coconut oil to Rt pectoralis during P/ROM; prone briefly for bil medial scapular borders    Myofascial Release To LT axilla at end P/ROM stretching    Passive ROM In Supine to Rt shoulder into flexion, abduction and er to pts available motion. Rt shoulder depression througout with VCs to relax for increased comfort with stretch, then Lt shoulder into flexion, abduction and D2                    PT Short Term Goals - 09/25/19 1202      PT SHORT TERM GOAL #1   Title Pt will be independent with HEP and Modified MLD for the anterior trunk within 2 weeks in order to demonstrate autonomy of care.    Baseline pt provided with HEP today    Time 2    Period Weeks    Status New    Target Date 10/16/19             PT Long Term Goals - 11/13/19 1116      PT LONG TERM GOAL #1   Title Pt will demonstrate 150 degrees bil shoulder flexion and abduction within 4 weeks to demonstrate return to pre-operative AROM.    Baseline R shoulder flexion: 110, abduction: 69 L shoulder flexion: 118, abduction:  77; approximate AA/ROM with pulleys Rt 120 and abduction 140 degrees, Lt 150 degrees flexion and abduction (forgot to measure but this is visibly much improved since last measured)-11/13/19    Time 4    Period Weeks    Status On-going      PT LONG TERM GOAL #2   Title Pt will report 75% improvement in functional mobility and pain in order to demonstrate an improvement in subjective quality of life.    Baseline 2/10 pain, decresaed ROM below shoulder height. MOdifications with bathing/dressing; pt reports 50% improvement at this time as she is still limited with vaccuuming, lifting and reaching above head-11/13/19    Time 4    Period Weeks    Status Revised      PT LONG TERM GOAL #3   Title Pt to be independent with HEP for improved A/ROM for bil shoulders and bil UE and postural strength to improve ease with ADLs.    Baseline No current HEP though began this today as she now has clearance for  ROM from surgeon-11/13/19    Time 4    Period Weeks    Status New                 Plan - 11/19/19 1221    Clinical Impression Statement Continued wtih manaul therapy today but incorporated AA/ROM as well. Pt is tolerating all stretching very well with good improvements noted each time of increased  motion of bil shoulders. Her Rt shoulder continues to demonstrate increase tightness compared to Lt, as her pectoralis is very tight visibly and to palapation limitng her end motions due to healing and recent surgical debridement recovery. She was told by Dr. Marla Roe to wait 2 more weeks before strength progresion (week of 12/01/19) so continued with ROM only today, and no MFR at Rt incision.    Personal Factors and Comorbidities Comorbidity 1    Comorbidities bil masectomy with 3 lymph node removal on the R    Stability/Clinical Decision Making Stable/Uncomplicated    Rehab Potential Good    PT Frequency 2x / week    PT Duration 4 weeks    PT Treatment/Interventions Therapeutic activities;Therapeutic exercise;Neuromuscular re-education;Manual techniques;Cryotherapy;Moist Heat    PT Next Visit Plan Cont progressing bil shoulder end ROM, cont manual therapy to Rt>Lt pectoralis, and progress HEP as able (can progress to strength week of 12/01/19).    PT Home Exercise Plan Access Code: [facility]; standing dowel exercises    Consulted and Agree with Plan of Care Patient           Patient will benefit from skilled therapeutic intervention in order to improve the following deficits and impairments:  Decreased knowledge of precautions, Pain, Decreased range of motion, Increased edema  Visit Diagnosis: Malignant neoplasm of upper-outer quadrant of right breast in female, estrogen receptor positive (HCC)  Stiffness of left shoulder, not elsewhere classified  Stiffness of right shoulder, not elsewhere classified  Acute pain of right shoulder  Acute pain of left shoulder  Sternal  pain  Abnormal posture     Problem List Patient Active Problem List   Diagnosis Date Noted  . Acquired absence of breast and absent nipple, bilateral 10/07/2019  . Breast wound, right, initial encounter 10/07/2019  . Breast cancer of upper-outer quadrant of right female breast ([place]) 09/05/2019  . Genetic testing 08/12/2019  . Family history of BRCA gene mutation 07/31/2019  . Family history of breast cancer   . Family history of prostate cancer   . Family history of bladder cancer   . Malignant neoplasm of upper-outer quadrant of right breast in female, estrogen receptor positive ([place]) 07/24/2019  . Annual physical exam 03/31/2016  . Occupational bronchitis ([place]) 03/03/2015  . Hyperlipidemia 03/30/2014  . 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 Generalized osteoarthritis 02/23/2012  . Plantar fasciitis, bilateral 02/23/2012  . Vitiligo 02/23/2012  . Difficulty hearing 02/13/2012  . History of lupus 12/21/2011  . Vitamin D deficiency 10/11/2010  . Hypothyroidism 06/21/2010  . Systemic lupus erythematosus ([place]) 06/14/2010    Otelia Limes, PTA 11/19/2019, 12:26 PM  [place], [place], [address] Phone: [phone]   Fax:  [phone]  Name: Delora Gravatt MRN: [id number] Date of Birth: Feb 04, 1961

## 2019-11-19 NOTE — Progress Notes (Signed)
MD requested change of trastuzumab to the brand Trazimera (trastuzumab-qyyp) and with last dose >7 days MD approved reload dose of 4 mg/kg for treatment scheduled 11/24/19 then resume 2 mg/kg dose.  PA has been approved.  T.O. Dr Lavonda Jumbo, PharmD

## 2019-11-19 NOTE — Telephone Encounter (Signed)
I spoke with Stephanie Chase and she states the rash has resolved.  She stated she still has the ringing in her ears.  It has gotten better but it still continues.

## 2019-11-21 ENCOUNTER — Other Ambulatory Visit (HOSPITAL_COMMUNITY): Payer: Self-pay

## 2019-11-21 DIAGNOSIS — C50411 Malignant neoplasm of upper-outer quadrant of right female breast: Secondary | ICD-10-CM

## 2019-11-21 DIAGNOSIS — Z170 Estrogen receptor positive status [ER+]: Secondary | ICD-10-CM

## 2019-11-24 ENCOUNTER — Other Ambulatory Visit: Payer: Self-pay | Admitting: Hematology

## 2019-11-24 ENCOUNTER — Inpatient Hospital Stay: Payer: BC Managed Care – PPO

## 2019-11-24 ENCOUNTER — Other Ambulatory Visit: Payer: Self-pay

## 2019-11-24 ENCOUNTER — Ambulatory Visit (HOSPITAL_COMMUNITY)
Admission: RE | Admit: 2019-11-24 | Discharge: 2019-11-24 | Disposition: A | Discharge status: Home / Self Care | Payer: BC Managed Care – PPO | Source: Ambulatory Visit | Attending: Internal Medicine | Admitting: Internal Medicine

## 2019-11-24 ENCOUNTER — Ambulatory Visit (HOSPITAL_BASED_OUTPATIENT_CLINIC_OR_DEPARTMENT_OTHER)
Admission: RE | Admit: 2019-11-24 | Discharge: 2019-11-24 | Disposition: A | Discharge status: Home / Self Care | Payer: BC Managed Care – PPO | Source: Ambulatory Visit | Attending: Internal Medicine | Admitting: Internal Medicine

## 2019-11-24 VITALS — BP 136/90 | HR 77 | Temp 98.6°F | Resp 18 | Ht 65.0 in | Wt 159.1 lb

## 2019-11-24 VITALS — BP 104/78 | HR 80 | Wt 161.0 lb

## 2019-11-24 DIAGNOSIS — Z720 Tobacco use: Secondary | ICD-10-CM | POA: Diagnosis not present

## 2019-11-24 DIAGNOSIS — E785 Hyperlipidemia, unspecified: Secondary | ICD-10-CM | POA: Diagnosis not present

## 2019-11-24 DIAGNOSIS — C50411 Malignant neoplasm of upper-outer quadrant of right female breast: Secondary | ICD-10-CM

## 2019-11-24 DIAGNOSIS — J449 Chronic obstructive pulmonary disease, unspecified: Secondary | ICD-10-CM | POA: Diagnosis not present

## 2019-11-24 DIAGNOSIS — Z170 Estrogen receptor positive status [ER+]: Secondary | ICD-10-CM

## 2019-11-24 DIAGNOSIS — Z803 Family history of malignant neoplasm of breast: Secondary | ICD-10-CM | POA: Insufficient documentation

## 2019-11-24 DIAGNOSIS — E039 Hypothyroidism, unspecified: Secondary | ICD-10-CM | POA: Diagnosis not present

## 2019-11-24 DIAGNOSIS — F17210 Nicotine dependence, cigarettes, uncomplicated: Secondary | ICD-10-CM | POA: Insufficient documentation

## 2019-11-24 DIAGNOSIS — L930 Discoid lupus erythematosus: Secondary | ICD-10-CM | POA: Diagnosis not present

## 2019-11-24 DIAGNOSIS — Z79899 Other long term (current) drug therapy: Secondary | ICD-10-CM | POA: Insufficient documentation

## 2019-11-24 DIAGNOSIS — K219 Gastro-esophageal reflux disease without esophagitis: Secondary | ICD-10-CM | POA: Diagnosis not present

## 2019-11-24 DIAGNOSIS — Z881 Allergy status to other antibiotic agents status: Secondary | ICD-10-CM | POA: Diagnosis not present

## 2019-11-24 DIAGNOSIS — Z5112 Encounter for antineoplastic immunotherapy: Secondary | ICD-10-CM | POA: Diagnosis not present

## 2019-11-24 DIAGNOSIS — F419 Anxiety disorder, unspecified: Secondary | ICD-10-CM | POA: Diagnosis not present

## 2019-11-24 LAB — CBC WITH DIFFERENTIAL (CANCER CENTER ONLY)
Abs Immature Granulocytes: 0.07 10*3/uL (ref 0.00–0.07)
Basophils Absolute: 0.1 10*3/uL (ref 0.0–0.1)
Basophils Relative: 1 %
Eosinophils Absolute: 0.1 10*3/uL (ref 0.0–0.5)
Eosinophils Relative: 1 %
HCT: 39.7 % (ref 36.0–46.0)
Hemoglobin: 13.2 g/dL (ref 12.0–15.0)
Immature Granulocytes: 1 %
Lymphocytes Relative: 27 %
Lymphs Abs: 2.1 10*3/uL (ref 0.7–4.0)
MCH: 28.6 pg (ref 26.0–34.0)
MCHC: 33.2 g/dL (ref 30.0–36.0)
MCV: 85.9 fL (ref 80.0–100.0)
Monocytes Absolute: 0.6 10*3/uL (ref 0.1–1.0)
Monocytes Relative: 7 %
Neutro Abs: 5.1 10*3/uL (ref 1.7–7.7)
Neutrophils Relative %: 63 %
Platelet Count: 298 10*3/uL (ref 150–400)
RBC: 4.62 MIL/uL (ref 3.87–5.11)
RDW: 12.9 % (ref 11.5–15.5)
WBC Count: 8 10*3/uL (ref 4.0–10.5)
nRBC: 0 % (ref 0.0–0.2)

## 2019-11-24 LAB — CMP (CANCER CENTER ONLY)
ALT: 62 U/L — ABNORMAL HIGH (ref 0–44)
AST: 45 U/L — ABNORMAL HIGH (ref 15–41)
Albumin: 3.8 g/dL (ref 3.5–5.0)
Alkaline Phosphatase: 124 U/L (ref 38–126)
Anion gap: 9 (ref 5–15)
BUN: 9 mg/dL (ref 6–20)
CO2: 24 mmol/L (ref 22–32)
Calcium: 11.1 mg/dL — ABNORMAL HIGH (ref 8.9–10.3)
Chloride: 103 mmol/L (ref 98–111)
Creatinine: 0.77 mg/dL (ref 0.44–1.00)
GFR, Est AFR Am: >60 mL/min (ref >60)
GFR, Estimated: >60 mL/min (ref >60)
Glucose, Bld: 89 mg/dL (ref 70–99)
Potassium: 4 mmol/L (ref 3.5–5.1)
Sodium: 136 mmol/L (ref 135–145)
Total Bilirubin: 0.4 mg/dL (ref 0.3–1.2)
Total Protein: 7.3 g/dL (ref 6.5–8.1)

## 2019-11-24 LAB — ECHOCARDIOGRAM LIMITED
Area-P 1/2: 3.31 cm2
Height: 65 in
S' Lateral: 2.6 cm
Weight: 2545.6 oz

## 2019-11-24 MED ORDER — DIPHENHYDRAMINE HCL 50 MG/ML IJ SOLN
INTRAMUSCULAR | Status: AC
  Filled 2019-11-24: qty 1

## 2019-11-24 MED ORDER — SODIUM CHLORIDE 0.9% FLUSH
10.0000 mL | INTRAVENOUS | Status: DC | PRN
  Administered 2019-11-24: 10 mL
  Filled 2019-11-24: qty 10

## 2019-11-24 MED ORDER — HEPARIN SOD (PORK) LOCK FLUSH 100 UNIT/ML IV SOLN
500.0000 [IU] | Freq: Once | INTRAVENOUS | Status: AC | PRN
  Administered 2019-11-24: 500 [IU]
  Filled 2019-11-24: qty 5

## 2019-11-24 MED ORDER — ACETAMINOPHEN 325 MG PO TABS
ORAL_TABLET | ORAL | Status: AC
  Filled 2019-11-24: qty 2

## 2019-11-24 MED ORDER — SODIUM CHLORIDE 0.9 % IV SOLN
Freq: Once | INTRAVENOUS | Status: AC
  Filled 2019-11-24: qty 250

## 2019-11-24 MED ORDER — DIPHENHYDRAMINE HCL 25 MG PO CAPS
ORAL_CAPSULE | ORAL | Status: AC
  Filled 2019-11-24: qty 1

## 2019-11-24 MED ORDER — SODIUM CHLORIDE 0.9 % IV SOLN
80.0000 mg/m2 | Freq: Once | INTRAVENOUS | Status: AC
  Administered 2019-11-24: 144 mg via INTRAVENOUS
  Filled 2019-11-24: qty 24

## 2019-11-24 MED ORDER — DIPHENHYDRAMINE HCL 50 MG/ML IJ SOLN
25.0000 mg | Freq: Once | INTRAMUSCULAR | Status: AC
  Administered 2019-11-24: 25 mg via INTRAVENOUS

## 2019-11-24 MED ORDER — TRASTUZUMAB-QYYP CHEMO 420 MG IV SOLR
300.0000 mg | Freq: Once | INTRAVENOUS | Status: AC
  Administered 2019-11-24: 300 mg via INTRAVENOUS
  Filled 2019-11-24: qty 14.29

## 2019-11-24 MED ORDER — SODIUM CHLORIDE 0.9 % IV SOLN
40.0000 mg | Freq: Once | INTRAVENOUS | Status: DC
  Filled 2019-11-24: qty 4

## 2019-11-24 MED ORDER — SODIUM CHLORIDE 0.9 % IV SOLN
20.0000 mg | Freq: Once | INTRAVENOUS | Status: AC
  Administered 2019-11-24: 20 mg via INTRAVENOUS
  Filled 2019-11-24: qty 20

## 2019-11-24 MED ORDER — ACETAMINOPHEN 325 MG PO TABS
650.0000 mg | ORAL_TABLET | Freq: Once | ORAL | Status: AC
  Administered 2019-11-24: 650 mg via ORAL

## 2019-11-24 MED ORDER — SODIUM CHLORIDE 0.9 % IV SOLN
40.0000 mg | Freq: Once | INTRAVENOUS | Status: AC
  Administered 2019-11-24: 40 mg via INTRAVENOUS
  Filled 2019-11-24: qty 4

## 2019-11-24 NOTE — Patient Instructions (Signed)
[facility] Discharge Instructions for Patients Receiving Chemotherapy  Today you received the following chemotherapy agents: Trastuzumab, Paclitaxel  To help prevent nausea and vomiting after your treatment, we encourage you to take your nausea medication as directed.   If you develop nausea and vomiting that is not controlled by your nausea medication, call the clinic.   BELOW ARE SYMPTOMS THAT SHOULD BE REPORTED IMMEDIATELY:  *FEVER GREATER THAN 100.5 F  *CHILLS WITH OR WITHOUT FEVER  NAUSEA AND VOMITING THAT IS NOT CONTROLLED WITH YOUR NAUSEA MEDICATION  *UNUSUAL SHORTNESS OF BREATH  *UNUSUAL BRUISING OR BLEEDING  TENDERNESS IN MOUTH AND THROAT WITH OR WITHOUT PRESENCE OF ULCERS  *URINARY PROBLEMS  *BOWEL PROBLEMS  UNUSUAL RASH Items with * indicate a potential emergency and should be followed up as soon as possible.  Feel free to call the clinic should you have any questions or concerns. The clinic phone number is [phone].  Please show the CHEMO ALERT CARD at check-in to the Emergency Department and triage nurse.   

## 2019-11-24 NOTE — Progress Notes (Signed)
CARDIO-ONCOLOGY CLINIC CONSULT NOTE  Referring Physician: Dr. Burr Medico Primary Care: Delsa Bern, MD  Primary Cardiologist: New  HPI:  Stephanie Chase is a 59 y.o. female with COPD with ongoing tobacco use, discoid lupus, hypothyroidism, hyperlipidemia, right breast cancer referred by Dr. Burr Medico for enrollment into the Cardio-Oncology program.  She was diagnosed with R breast CA in 06/2019. StageIA, pT1cN0M0,ER+/HER2+, PR-,Grade III. 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 Her margins were clear and she was node negative. Her left breast tissue was benign. Given her HER2 positive high grade disease she was started on weeklypaclitaxelandtrastuzumabfor 12 weeks on 10/28/19, followed bymaintenancetrastuzumabq3weeks   She has been struggling with R breast wound post-op and saw Dr. Marla Roe and is almost completely healed.   Denies any history of known heart disease. Had a stress test years ago (unsure why) which was OK. Works as a Barista. Has some central chest discomfort since surgery. Can come and go at any time. Not reproducible.   Echo today EF 60-65% Personally reviewed  ECHO: 5/21 EF 65-70% GLS -20.4%   Reviewed personally.   Review of Systems: [y] = yes, '[ ]'  = no   General: Weight gain '[ ]' ; Weight loss '[ ]' ; Anorexia '[ ]' ; Fatigue '[ ]' ; Fever '[ ]' ; Chills '[ ]' ; Weakness '[ ]'   Cardiac: Chest pain/pressure '[ ]' ; Resting SOB '[ ]' ; Exertional SOB '[ ]' ; Orthopnea '[ ]' ; Pedal Edema '[ ]' ; Palpitations '[ ]' ; Syncope '[ ]' ; Presyncope '[ ]' ; Paroxysmal nocturnal dyspnea'[ ]'   Pulmonary: Cough '[ ]' ; Wheezing'[ ]' ; Hemoptysis'[ ]' ; Sputum '[ ]' ; Snoring '[ ]'   GI: Vomiting'[ ]' ; Dysphagia'[ ]' ; Melena'[ ]' ; Hematochezia '[ ]' ; Heartburn'[ ]' ; Abdominal pain '[ ]' ; Constipation '[ ]' ; Diarrhea '[ ]' ; BRBPR '[ ]'   GU:  Hematuria'[ ]' ; Dysuria '[ ]' ; Nocturia'[ ]'   Vascular: Pain in legs with walking '[ ]' ; Pain in feet with lying flat '[ ]' ; Non-healing sores '[ ]' ; Stroke '[ ]' ; TIA '[ ]' ; Slurred speech '[ ]' ;  Neuro: Headaches'[ ]' ; Vertigo'[ ]' ; Seizures'[ ]' ; Paresthesias'[ ]' ;Blurred vision '[ ]' ; Diplopia '[ ]' ; Vision changes '[ ]'   Ortho/Skin: Arthritis '[ ]' ; Joint pain '[ ]' ; Muscle pain '[ ]' ; Joint swelling '[ ]' ; Back Pain '[ ]' ; Rash '[ ]'   Psych: Depression'[ ]' ; Anxiety'[ ]'   Heme: Bleeding problems '[ ]' ; Clotting disorders '[ ]' ; Anemia '[ ]'   Endocrine: Diabetes '[ ]' ; Thyroid dysfunction'[ ]'    Past Medical History:  Diagnosis Date  . Anemia   . Anxiety   . Asthma   . Breast cancer ([place])   . COPD (chronic obstructive pulmonary disease) ([place])   . Discoid lupus   . Family history of bladder cancer   . Family history of BRCA gene mutation   . Family history of breast cancer   . Family history of prostate cancer   . GERD (gastroesophageal reflux disease)   . Hypothyroidism   . Pneumonia   . Thyroid disease     Current Outpatient Medications  Medication Sig Dispense Refill  . acetaminophen (TYLENOL) 500 MG tablet Take 500-1,000 mg by mouth every 6 (six) hours as needed for moderate pain.     . Albuterol Sulfate (PROAIR RESPICLICK) 409 (90 Base) MCG/ACT AEPB Inhale 2 puffs into the lungs every 6 (six) hours as needed. (Patient taking differently: Inhale 2 puffs into the lungs  every 6 (six) hours as needed (SOB). ) 1 each 3  . ALPRAZolam (XANAX) 0.25 MG tablet Take 1 tablet (0.25 mg total) by mouth daily as needed for anxiety. 30 tablet 0  . Ascorbic Acid (VITAMIN C) 1000 MG tablet Take 1,000 mg by mouth daily.    . cetirizine (ZYRTEC) 10 MG tablet Take 10 mg by mouth daily as needed for allergies.     . Cholecalciferol (VITAMIN D) 50 MCG (2000 UT) tablet Take 2,000 Units by mouth daily.    . fluticasone (FLONASE) 50 MCG/ACT nasal spray Place 1 spray into both nostrils daily as needed for allergies.     Marland Kitchen levothyroxine (SYNTHROID) 75 MCG  tablet Take 75 mcg by mouth daily before breakfast.    . lidocaine-prilocaine (EMLA) cream Apply 1 application topically as needed. (Patient taking differently: Apply 1 application topically daily as needed (port access). ) 30 g 1  . liothyronine (CYTOMEL) 5 MCG tablet TAKE 2 TABLETS (=10MCG     TOTAL)     DAILY (Patient taking differently: Take 10 mcg by mouth daily. ) 180 tablet 1  . nicotine (NICODERM CQ - DOSED IN MG/24 HOURS) 21 mg/24hr patch Place 21 mg onto the skin daily.    Marland Kitchen OVER THE COUNTER MEDICATION Take 1 each by mouth 2 (two) times daily as needed (anxiety / sleep). CBD Gummies    . oxyCODONE (OXY IR/ROXICODONE) 5 MG immediate release tablet Take 1 tablet (5 mg total) by mouth every 6 (six) hours as needed for breakthrough pain. 20 tablet 0  . Zinc Sulfate 220 (50 Zn) MG TABS TAKE 1 TABLET (220 MG TOTAL) BY MOUTH IN THE MORNING AND AT BEDTIME. 60 tablet 0  . dextromethorphan (DELSYM) 30 MG/5ML liquid Take 60 mg by mouth 2 (two) times daily as needed for cough. (Patient not taking: Reported on 11/18/2019)    . methocarbamol (ROBAXIN) 500 MG tablet Take 1 tablet (500 mg total) by mouth every 6 (six) hours as needed (use for muscle cramps/pain). (Patient not taking: Reported on 11/24/2019) 20 tablet 1  . Multiple Vitamin (MULTIVITAMIN WITH MINERALS) TABS tablet Take 1 tablet by mouth 2 (two) times daily. (Patient not taking: Reported on 11/18/2019)    . ondansetron (ZOFRAN) 8 MG tablet Take 1 tablet (8 mg total) by mouth 2 (two) times daily as needed (Nausea or vomiting). (Patient not taking: Reported on 11/24/2019) 30 tablet 1  . prochlorperazine (COMPAZINE) 10 MG tablet Take 1 tablet (10 mg total) by mouth every 6 (six) hours as needed (Nausea or vomiting). (Patient not taking: Reported on 11/24/2019) 30 tablet 1   No current facility-administered medications for this encounter.   Facility-Administered Medications Ordered in Other Encounters  Medication Dose Route Frequency Provider Last  Rate Last Admin  . alum & mag hydroxide-simeth (MAALOX/MYLANTA) 200-200-20 MG/5ML suspension 30 mL  30 mL Oral Once Harle Stanford., PA-C        Allergies  Allergen Reactions  . Bactrim [Sulfamethoxazole-Trimethoprim] Itching and Rash      Social History   Socioeconomic History  . Marital status: Divorced    Spouse name: Not on file  . Number of children: 2  . Years of education: Not on file  . Highest education level: Not on file  Occupational History  . Not on file  Tobacco Use  . Smoking status: Current Every Day Smoker    Packs/day: 1.00    Years: 50.00    Pack years: 50.00    Types: Cigarettes  .  Smokeless tobacco: Never Used  Vaping Use  . Vaping Use: Never used  Substance and Sexual Activity  . Alcohol use: Yes    Comment: once a month  . Drug use: No  . Sexual activity: Yes    Partners: Male    Birth control/protection: Surgical    Comment: BTL and ablation  Other Topics Concern  . Not on file  Social History Narrative  . Not on file   Social Determinants of Health   Financial Resource Strain:   . Difficulty of Paying Living Expenses: Not on file  Food Insecurity:   . Worried About Charity fundraiser in the Last Year: Not on file  . Ran Out of Food in the Last Year: Not on file  Transportation Needs:   . Lack of Transportation (Medical): Not on file  . Lack of Transportation (Non-Medical): Not on file  Physical Activity:   . Days of Exercise per Week: Not on file  . Minutes of Exercise per Session: Not on file  Stress:   . Feeling of Stress : Not on file  Social Connections:   . Frequency of Communication with Friends and Family: Not on file  . Frequency of Social Gatherings with Friends and Family: Not on file  . Attends Religious Services: Not on file  . Active Member of Clubs or Organizations: Not on file  . Attends Archivist Meetings: Not on file  . Marital Status: Not on file  Intimate Partner Violence:   . Fear of Current or  Ex-Partner: Not on file  . Emotionally Abused: Not on file  . Physically Abused: Not on file  . Sexually Abused: Not on file      Family History  Problem Relation Age of Onset  . Breast cancer Mother 98  . Diabetes Mother   . Bladder Cancer Mother 71       ureteral cancer  . Cancer Mother        blood cancer  . Heart attack Father   . Hyperlipidemia Father   . Hypertension Father   . Alzheimer's disease Father   . Prostate cancer Father        dx. >50  . Alzheimer's disease Other   . Cancer Maternal Uncle        prostate or colon  . BRCA 1/2 Daughter   . Breast cancer Other 45       maternal great-aunt  . Cancer Cousin        unknown type; dx. in her 16s (maternal cousin)    Vitals:   11/24/19 1516  BP: 104/78  Pulse: 80  SpO2: 96%  Weight: 73 kg (161 lb)    PHYSICAL EXAM: General:  Well appearing. No respiratory difficulty HEENT: normal Neck: supple. no JVD. Carotids 2+ bilat; no bruits. No lymphadenopathy or thryomegaly appreciated. Cor: PMI nondisplaced. Regular rate & rhythm. No rubs, gallops or murmurs. Lungs: decreased throughout. No wheeze Abdomen: soft, nontender, nondistended. No hepatosplenomegaly. No bruits or masses. Good bowel sounds. Extremities: no cyanosis, clubbing, rash, edema Neuro: alert & oriented x 3, cranial nerves grossly intact. moves all 4 extremities w/o difficulty. Affect pleasant.  ECG: NSR 84 LAD, LVH No ST-T wave abnormalities. Personally reviewed   ASSESSMENT & PLAN:  1. Right Breast Cancer - She was diagnosed with R breast CA in 06/2019. StageIA, pT1cN0M0,ER+/HER2+, PR-,Grade III.  - Echo 5/21 EF 65-70% GLS -20.4% - Echo today 11/24/19 EF 60-65% - Explained incidence of Herceptin cardiotoxicity and role  of Cardio-oncology clinic at length. Echo images reviewed personally. All parameters stable. Reviewed signs and symptoms of HF to look for. Continue Herceptin. Follow-up with echo in 3 months.  2. Tobacco use - smokes  1/4-1.0 ppd.  - Encouraged to quit  3. Hyperlipidemia - LDL 168 in 6/19. - will recheck  - Will need further CV risk stratification after she gets through the first part of her chemo  Stephanie Bickers, MD  3:50 PM

## 2019-11-24 NOTE — Progress Notes (Signed)
Echocardiogram 2D Echocardiogram has been performed.  Oneal Deputy Jaivon Vanbeek 11/24/2019, 3:07 PM

## 2019-11-24 NOTE — Patient Instructions (Signed)
Your physician recommends you have a follow up appointment and echocardiogram in 3 months.  If you have any questions or concerns before your next appointment please send [place] a message through [place] or call our office at [phone].    TO LEAVE A MESSAGE FOR THE NURSE SELECT OPTION 2, PLEASE LEAVE A MESSAGE INCLUDING: . YOUR NAME . DATE OF BIRTH . CALL BACK NUMBER . REASON FOR CALL**this is important as we prioritize the call backs  [place] AS LONG AS YOU CALL BEFORE 4:00 PM At the [facility], you and your health needs are our priority. As part of our continuing mission to provide you with exceptional heart care, we have created designated Provider Care Teams. These Care Teams include your primary Cardiologist (physician) and Advanced Practice Providers (APPs- Physician Assistants and Nurse Practitioners) who all work together to provide you with the care you need, when you need it.   You may see any of the following providers on your designated Care Team at your next follow up: Marland Kitchen Dr Glori Bickers . Dr Loralie Champagne . Darrick Grinder, NP . Lyda Jester, PA . Audry Riles, PharmD   Please be sure to bring in all your medications bottles to every appointment.

## 2019-11-24 NOTE — Addendum Note (Signed)
Encounter addended by: Jolaine Artist, MD on: 11/24/2019 4:51 PM  Actions taken: Level of Service modified, Visit diagnoses modified

## 2019-11-25 LAB — CANCER ANTIGEN 27.29: CA 27.29: 12.5 U/mL (ref 0.0–38.6)

## 2019-11-26 ENCOUNTER — Inpatient Hospital Stay: Payer: BC Managed Care – PPO

## 2019-11-26 ENCOUNTER — Inpatient Hospital Stay: Payer: BC Managed Care – PPO | Attending: Hematology | Admitting: Medical

## 2019-11-26 ENCOUNTER — Other Ambulatory Visit: Payer: Self-pay

## 2019-11-26 VITALS — BP 121/86 | HR 77 | Temp 96.8°F | Resp 18 | Ht 65.0 in | Wt 162.5 lb

## 2019-11-26 DIAGNOSIS — H9319 Tinnitus, unspecified ear: Secondary | ICD-10-CM | POA: Insufficient documentation

## 2019-11-26 DIAGNOSIS — C50411 Malignant neoplasm of upper-outer quadrant of right female breast: Secondary | ICD-10-CM | POA: Diagnosis not present

## 2019-11-26 DIAGNOSIS — Z170 Estrogen receptor positive status [ER+]: Secondary | ICD-10-CM

## 2019-11-26 DIAGNOSIS — Z5111 Encounter for antineoplastic chemotherapy: Secondary | ICD-10-CM | POA: Diagnosis present

## 2019-11-26 DIAGNOSIS — E039 Hypothyroidism, unspecified: Secondary | ICD-10-CM | POA: Insufficient documentation

## 2019-11-26 DIAGNOSIS — Z5112 Encounter for antineoplastic immunotherapy: Secondary | ICD-10-CM | POA: Insufficient documentation

## 2019-11-26 DIAGNOSIS — R040 Epistaxis: Secondary | ICD-10-CM

## 2019-11-26 LAB — CMP (CANCER CENTER ONLY)
ALT: 62 U/L — ABNORMAL HIGH (ref 0–44)
AST: 32 U/L (ref 15–41)
Albumin: 3.7 g/dL (ref 3.5–5.0)
Alkaline Phosphatase: 116 U/L (ref 38–126)
Anion gap: 7 (ref 5–15)
BUN: 9 mg/dL (ref 6–20)
CO2: 26 mmol/L (ref 22–32)
Calcium: 10.4 mg/dL — ABNORMAL HIGH (ref 8.9–10.3)
Chloride: 105 mmol/L (ref 98–111)
Creatinine: 0.63 mg/dL (ref 0.44–1.00)
GFR, Est AFR Am: >60 mL/min (ref >60)
GFR, Estimated: >60 mL/min (ref >60)
Glucose, Bld: 86 mg/dL (ref 70–99)
Potassium: 4 mmol/L (ref 3.5–5.1)
Sodium: 138 mmol/L (ref 135–145)
Total Bilirubin: <0.2 mg/dL — ABNORMAL LOW (ref 0.3–1.2)
Total Protein: 7 g/dL (ref 6.5–8.1)

## 2019-11-26 LAB — CBC WITH DIFFERENTIAL (CANCER CENTER ONLY)
Abs Immature Granulocytes: 0.01 10*3/uL (ref 0.00–0.07)
Basophils Absolute: 0.1 10*3/uL (ref 0.0–0.1)
Basophils Relative: 1 %
Eosinophils Absolute: 0.1 10*3/uL (ref 0.0–0.5)
Eosinophils Relative: 1 %
HCT: 36.5 % (ref 36.0–46.0)
Hemoglobin: 12.2 g/dL (ref 12.0–15.0)
Immature Granulocytes: 0 %
Lymphocytes Relative: 44 %
Lymphs Abs: 2.8 10*3/uL (ref 0.7–4.0)
MCH: 28.8 pg (ref 26.0–34.0)
MCHC: 33.4 g/dL (ref 30.0–36.0)
MCV: 86.3 fL (ref 80.0–100.0)
Monocytes Absolute: 0.3 10*3/uL (ref 0.1–1.0)
Monocytes Relative: 4 %
Neutro Abs: 3.1 10*3/uL (ref 1.7–7.7)
Neutrophils Relative %: 50 %
Platelet Count: 290 10*3/uL (ref 150–400)
RBC: 4.23 MIL/uL (ref 3.87–5.11)
RDW: 13.3 % (ref 11.5–15.5)
WBC Count: 6.3 10*3/uL (ref 4.0–10.5)
nRBC: 0 % (ref 0.0–0.2)

## 2019-11-26 MED ORDER — SODIUM CHLORIDE 0.9% FLUSH
10.0000 mL | INTRAVENOUS | Status: DC | PRN
  Administered 2019-11-26: 10 mL
  Filled 2019-11-26: qty 10

## 2019-11-26 MED ORDER — HEPARIN SOD (PORK) LOCK FLUSH 100 UNIT/ML IV SOLN
500.0000 [IU] | Freq: Once | INTRAVENOUS | Status: AC | PRN
  Administered 2019-11-26: 500 [IU]
  Filled 2019-11-26: qty 5

## 2019-11-26 MED ORDER — FLUTICASONE PROPIONATE 50 MCG/ACT NA SUSP: 2.0000 | Freq: Two times a day (BID) | NASAL | 5 refills | Status: DC

## 2019-11-26 NOTE — Patient Instructions (Signed)

## 2019-11-28 ENCOUNTER — Other Ambulatory Visit: Payer: Self-pay

## 2019-11-28 ENCOUNTER — Ambulatory Visit: Payer: BC Managed Care – PPO | Attending: General Surgery

## 2019-11-28 ENCOUNTER — Telehealth: Payer: Self-pay | Admitting: *Deleted

## 2019-11-28 DIAGNOSIS — M25611 Stiffness of right shoulder, not elsewhere classified: Secondary | ICD-10-CM | POA: Insufficient documentation

## 2019-11-28 DIAGNOSIS — C50411 Malignant neoplasm of upper-outer quadrant of right female breast: Secondary | ICD-10-CM

## 2019-11-28 DIAGNOSIS — M25511 Pain in right shoulder: Secondary | ICD-10-CM

## 2019-11-28 DIAGNOSIS — R0789 Other chest pain: Secondary | ICD-10-CM | POA: Diagnosis present

## 2019-11-28 DIAGNOSIS — M25512 Pain in left shoulder: Secondary | ICD-10-CM | POA: Insufficient documentation

## 2019-11-28 DIAGNOSIS — R293 Abnormal posture: Secondary | ICD-10-CM

## 2019-11-28 DIAGNOSIS — M25612 Stiffness of left shoulder, not elsewhere classified: Secondary | ICD-10-CM | POA: Diagnosis present

## 2019-11-28 DIAGNOSIS — Z170 Estrogen receptor positive status [ER+]: Secondary | ICD-10-CM | POA: Diagnosis present

## 2019-11-28 NOTE — Therapy (Signed)
[place], [place], [address] Phone: [phone]   Fax:  [phone]  Physical Therapy Treatment  Patient Details  Name: Stephanie Chase MRN: [id number] Date of Birth: 06-30-1960 Referring Provider (PT): Dr. Stark Klein   Encounter Date: 11/28/2019   PT End of Session - 11/28/19 1206    Visit Number 8    Number of Visits 18    Date for PT Re-Evaluation 12/11/19    PT Start Time 1106    PT Stop Time 1205    PT Time Calculation (min) 59 min    Activity Tolerance Patient tolerated treatment well    Behavior During Therapy [facility] for tasks assessed/performed           Past Medical History:  Diagnosis Date  . Anemia   . Anxiety   . Asthma   . Breast cancer ([place])   . COPD (chronic obstructive pulmonary disease) ([place])   . Discoid lupus   . Family history of bladder cancer   . Family history of BRCA gene mutation   . Family history of breast cancer   . Family history of prostate cancer   . GERD (gastroesophageal reflux disease)   . Hypothyroidism   . Pneumonia   . Thyroid disease     Past Surgical History:  Procedure Laterality Date  . ABLATION ON ENDOMETRIOSIS    . APPLICATION OF A-CELL OF EXTREMITY Right 10/22/2019   Procedure: EXCISION OF RIGHT BREAST WOUND WITH PRIMARY CLOSURE;  Surgeon: [name] Going, DO;  Location: [place];  Service: Plastics;  Laterality: Right;  . BREAST LUMPECTOMY    . DEBRIDEMENT AND CLOSURE WOUND Right 10/22/2019   Procedure: Excision of right breast wound;  Surgeon: [name] Going, DO;  Location: [place];  Service: Plastics;  Laterality: Right;  . MASTECTOMY W/ SENTINEL NODE BIOPSY Bilateral 09/05/2019   Procedure: BILATERAL MASTECTOMY WITH RIGHT SENTINEL LYMPH NODE BIOPSY;  Surgeon: Stark Klein, MD;  Location: [place];  Service: General;  Laterality: Bilateral;  COMBINED WITH REGIONAL FOR POST OP PAIN  . PORTACATH PLACEMENT Left 09/05/2019   Procedure:  INSERTION PORT-A-CATH WITH ULTRASOUND GUIDANCE;  Surgeon: Stark Klein, MD;  Location: [place];  Service: General;  Laterality: Left;  . TONSILLECTOMY    . TUBAL LIGATION    . WISDOM TOOTH EXTRACTION      There were no vitals filed for this visit.   Subjective Assessment - 11/28/19 1112    Subjective I still have some cramping in my Rt shoulder at the top of it when I'n reaching like OH. I started having some dry blood in my nose but not a bloody nose so I went to the doctor and they just told me to use Flonase and I also started taking a Claritin.    Pertinent History Bil masectomy 09/05/19 with 3 lymph nodes removed and negative on the R and benign tisuse on the L. Patient was diagnosed on 06/09/2019 with right grade III invasive ductal carcinoma breast cancer. It mesures 1.3 cm in the upper outer quadrant with 1.6 cm of calcifications and DCIS. It is ER positive, PR negative, and HER2 positive with a Ki67 of 50%. She smokes about 1 pack/day.    Patient Stated Goals I want to get back to kayaking.    Currently in Pain? No/denies              [facility] PT Assessment - 11/28/19 0001      AROM   Right  Shoulder Flexion 129 Degrees    Right Shoulder ABduction 145 Degrees    Right Shoulder Internal Rotation 39 Degrees    Right Shoulder External Rotation 79 Degrees    Left Shoulder Flexion 147 Degrees    Left Shoulder ABduction 167 Degrees    Left Shoulder Internal Rotation 55 Degrees    Left Shoulder External Rotation 90 Degrees                         [facility] Adult PT Treatment/Exercise - 11/28/19 0001      Shoulder Exercises: Pulleys   Flexion 2 minutes    Flexion Limitations Min VCs to relax shoulders then able to return correct technique    ABduction 2 minutes    ABduction Limitations VCs to relax Rt>Lt shoulder      Shoulder Exercises: Therapy Ball   Flexion Both;Right;Left;10 reps   forward lean into end of stretch   ABduction Right;Left;5 reps   same side lean into  end of stretch   ABduction Limitations VCs to remind pt of correct technique      Shoulder Exercises: Stretch   Other Shoulder Stretches Modified downward dog on wall 5x, 5 sec holds returning therapist demo      Manual Therapy   Manual Therapy Passive ROM;Soft tissue mobilization;Myofascial release    Soft tissue mobilization With coconut oil to Rt pectoralis insertion during P/ROM, also briefly to upper trap    Passive ROM In Supine to Rt shoulder into flexion, abduction and er to pts available motion. Rt shoulder depression througout with VCs to relax for increased comfort with stretch                    PT Short Term Goals - 09/25/19 1202      PT SHORT TERM GOAL #1   Title Pt will be independent with HEP and Modified MLD for the anterior trunk within 2 weeks in order to demonstrate autonomy of care.    Baseline pt provided with HEP today    Time 2    Period Weeks    Status New    Target Date 10/16/19             PT Long Term Goals - 11/13/19 1116      PT LONG TERM GOAL #1   Title Pt will demonstrate 150 degrees bil shoulder flexion and abduction within 4 weeks to demonstrate return to pre-operative AROM.    Baseline R shoulder flexion: 110, abduction: 69 L shoulder flexion: 118, abduction:  77; approximate AA/ROM with pulleys Rt 120 and abduction 140 degrees, Lt 150 degrees flexion and abduction (forgot to measure but this is visibly much improved since last measured)-11/13/19    Time 4    Period Weeks    Status On-going      PT LONG TERM GOAL #2   Title Pt will report 75% improvement in functional mobility and pain in order to demonstrate an improvement in subjective quality of life.    Baseline 2/10 pain, decresaed ROM below shoulder height. MOdifications with bathing/dressing; pt reports 50% improvement at this time as she is still limited with vaccuuming, lifting and reaching above head-11/13/19    Time 4    Period Weeks    Status Revised      PT LONG TERM  GOAL #3   Title Pt to be independent with HEP for improved A/ROM for bil shoulders and bil UE and postural strength to improve  ease with ADLs.    Baseline No current HEP though began this today as she now has clearance for ROM from surgeon-11/13/19    Time 4    Period Weeks    Status New                 Plan - 11/28/19 1324    Clinical Impression Statement Continue with AA/ROM to bil shoulders which pt tolerates well and reports noticing improved motion with Lt>Rt shoulders. Then continued with manal therapy focusing only on Rt upper quadrant today as she reports her Lt doing so much better and Rt needing it more. Her P/ROM is improving well and her A/ROM measurements have improved very well since last measured. Did not progress to strength yet as MD did not clear this until next week which will be 6 weeks from surgical debridement. Incision has healed very well and pt is still wearing Allevyn type bandage over incision per MD request until next week.    Personal Factors and Comorbidities Comorbidity 1    Comorbidities bil masectomy with 3 lymph node removal on the R    Stability/Clinical Decision Making Stable/Uncomplicated    Rehab Potential Good    PT Frequency 2x / week    PT Duration 4 weeks    PT Treatment/Interventions Therapeutic activities;Therapeutic exercise;Neuromuscular re-education;Manual techniques;Cryotherapy;Moist Heat    PT Next Visit Plan Cont progressing bil shoulder end ROM, cont manual therapy to Rt>Lt pectoralis, and progress HEP as able; next week okay to begin strength, plan to start with supine scapular series with yellow theraband    PT Home Exercise Plan Access Code: [facility]; standing dowel exercises    Consulted and Agree with Plan of Care Patient           Patient will benefit from skilled therapeutic intervention in order to improve the following deficits and impairments:  Decreased knowledge of precautions, Pain, Decreased range of motion, Increased  edema  Visit Diagnosis: Malignant neoplasm of upper-outer quadrant of right breast in female, estrogen receptor positive (HCC)  Stiffness of left shoulder, not elsewhere classified  Stiffness of right shoulder, not elsewhere classified  Acute pain of right shoulder  Acute pain of left shoulder  Sternal pain  Abnormal posture     Problem List Patient Active Problem List   Diagnosis Date Noted  . Acquired absence of breast and absent nipple, bilateral 10/07/2019  . Breast wound, right, initial encounter 10/07/2019  . Breast cancer of upper-outer quadrant of right female breast ([place]) 09/05/2019  . Genetic testing 08/12/2019  . Family history of BRCA gene mutation 07/31/2019  . Family history of breast cancer   . Family history of prostate cancer   . Family history of bladder cancer   . Malignant neoplasm of upper-outer quadrant of right breast in female, estrogen receptor positive ([place]) 07/24/2019  . Annual physical exam 03/31/2016  . Occupational bronchitis ([place]) 03/03/2015  . Hyperlipidemia 03/30/2014  . 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 Generalized osteoarthritis 02/23/2012  . Plantar fasciitis, bilateral 02/23/2012  . Vitiligo 02/23/2012  . Difficulty hearing 02/13/2012  . History of lupus 12/21/2011  . Vitamin D deficiency 10/11/2010  . Hypothyroidism 06/21/2010  . Systemic lupus erythematosus ([place]) 06/14/2010    Otelia Limes, PTA 11/28/2019, 1:29 PM  [place], [place], [address] Phone: [phone]   Fax:  [phone]  Name: Stephanie Chase MRN: [id number] Date of Birth: Jan 27, 1961

## 2019-11-28 NOTE — Telephone Encounter (Signed)
Pt called regarding questions around work and STD. Pt feels she may need a change in her paperwork. Informed pt to send new STD paperwork to be filled out. Pt relate having severe exhaustion after chemo with needs of taking rests during the date. Pt is trying to work during chemo. Denies further needs at this time.

## 2019-12-01 ENCOUNTER — Other Ambulatory Visit: Payer: Self-pay | Admitting: Plastic Surgery

## 2019-12-01 NOTE — Progress Notes (Signed)
[place]   [phone] [phone]   Clinic Follow up Note   Patient Care Team: Delsa Bern, MD as PCP - General (Obstetrics and Gynecology) Richrd Prime as Consulting Physician (Obstetrics and Gynecology) Mauro Kaufmann, RN as Oncology Nurse Navigator Rockwell Germany, RN as Oncology Nurse Navigator Stark Klein, MD as Consulting Physician (General Surgery) Truitt Merle, MD as Consulting Physician (Hematology) Kyung Rudd, MD as Consulting Physician (Radiation Oncology) Register, Luetta Nutting, PA-C as Physician Assistant (Dermatology) [name] Going, DO as Attending Physician (Plastic Surgery) 12/02/2019  CHIEF COMPLAINT: F/u right breast cancer   SUMMARY OF ONCOLOGIC HISTORY: Oncology History Overview Note  Cancer Staging Malignant neoplasm of upper-outer quadrant of right breast in female, estrogen receptor positive ([place]) Staging form: Breast, AJCC 8th Edition - Clinical stage from 07/30/2019: Stage IA (cT1c, cN0, cM0, G3, ER+, PR-, HER2+) - Unsigned    Malignant neoplasm of upper-outer quadrant of right breast in female, estrogen receptor positive ([place])  07/03/2019 Mammogram   Diagnostic Mammogram  IMPRESSION There is a 1.3x1.1cm focal asymmetry in the retroareolar anterior to middle depth right breast 2.2cm from the nipple.  There is a 2 cm focal asymmetry with appearance in the upper outer quadrant posterior depth of right breast 3 cm from nipple -Both suspicious and biopsy recommended.     07/18/2019 Initial Biopsy   Diagnosis 07/18/19 1. Breast, right, needle core biopsy, 12 o'clock - INVASIVE DUCTAL CARCINOMA. 2. Breast, right, needle core biopsy, upper outer quadrant - DUCTAL CARCINOMA IN SITU. Microscopic Comment 1. The invasive carcinoma is nuclear grade 3. The greatest linear extent of tumor in any one core is 11 mm. Ancillary studies will be reported separately. CKAE1AE3, GATA-3, ER and E-cadherin are positive. PAX 8 is  negative. CK7 is noncontributory. 2. The in situ carcinoma is high nuclear grade with central necrosis and calcifications. E-cadherin is positive. P63, Calponin and SMM-1 demonstrate the present of myoepithelium.   07/18/2019 Receptors her2   1. PROGNOSTIC INDICATORS 07/18/19 Results: IMMUNOHISTOCHEMICAL AND MORPHOMETRIC ANALYSIS PERFORMED MANUALLY The tumor cells are POSITIVE for Her2 (3+). Of note, the tumor shows heterogeneity in regards to Her2 expression. Estrogen Receptor: 95%, POSITIVE, STRONG STAINING INTENSITY Progesterone Receptor: 0%, NEGATIVE Proliferation Marker Ki67: 50%   07/24/2019 Initial Diagnosis   Malignant neoplasm of upper-outer quadrant of right breast in female, estrogen receptor positive ([place])   08/01/2019 Breast MRI   IMPRESSION: 1. Known invasive ductal carcinoma measures 1.2 centimeters in the 12 o'clock location. 2. Non mass enhancement measures 2.3 centimeters in the LATERAL portion of the RIGHT breast corresponding to DCIS on biopsy. 3. An additional mass in the UPPER OUTER QUADRANT of the RIGHT breast is 1.5 centimeters and warrants tissue diagnosis. This area correlates with distortion seen mammographically. 4. No axillary adenopathy or ancillary findings. LEFT breast is negative.     08/04/2019 Echocardiogram   IMPRESSIONS     1. Left ventricular ejection fraction, by estimation, is 65 to 70%. The  left ventricle has normal function. The left ventricle has no regional  wall motion abnormalities. Left ventricular diastolic parameters were  normal. The average left ventricular  global longitudinal strain is -20.4 %.   2. Right ventricular systolic function is normal. The right ventricular  size is normal.   3. The mitral valve is normal in structure. No evidence of mitral valve  regurgitation. No evidence of mitral stenosis.   4. The aortic valve is normal in structure. Aortic valve regurgitation is  not visualized. No aortic  stenosis is present.      08/10/2019 Genetic Testing   Negative genetic testing:  No pathogenic variants detected on the [facility] Breast Cancer STAT panel + Common Hereditary Cancers panel. A variant of uncertain significance (VUS) was detected in the ATM gene called c.3834C>A. The report date is 08/10/2019.  The Breast Cancer STAT Panel offered by [facility] includes sequencing and deletion/duplication analysis for the following 9 genes:  ATM, BRCA1, BRCA2, CDH1, CHEK2, PALB2, PTEN, STK11 and TP53. The Common Hereditary Cancers Panel offered by [facility] includes sequencing and/or deletion duplication testing of the following 48 genes: APC, ATM, AXIN2, BARD1, BMPR1A, BRCA1, BRCA2, BRIP1, CDH1, CDK4, CDKN2A (p14ARF), CDKN2A (p16INK4a), CHEK2, CTNNA1, DICER1, EPCAM (Deletion/duplication testing only), GREM1 (promoter region deletion/duplication testing only), KIT, MEN1, MLH1, MSH2, MSH3, MSH6, MUTYH, NBN, NF1, NHTL1, PALB2, PDGFRA, PMS2, POLD1, POLE, PTEN, RAD50, RAD51C, RAD51D, RNF43, SDHB, SDHC, SDHD, SMAD4, SMARCA4. STK11, TP53, TSC1, TSC2, and VHL.  The following genes were evaluated for sequence changes only: SDHA and HOXB13 c.251G>A variant only.    09/05/2019 Surgery   BILATERAL MASTECTOMY WITH RIGHT SENTINEL LYMPH NODE BIOPSY and PAC placement by Dr Barry Dienes    09/05/2019 Pathology Results   FINAL MICROSCOPIC DIAGNOSIS:   A. BREAST, LEFT, MASTECTOMY:  - Benign breast tissue.   B. BREAST, RIGHT, MASTECTOMY:  - Invasive ductal carcinoma, multifocal, 1.4 cm in greatest dimension,  [place] grade 3 of 3.  - Ductal carcinoma in situ, high nuclear grade with central necrosis and  calcifications.  - Margins of resection are not involved.  - Biopsy sites.  - See oncology table.   C. SENTINEL LYMPH NODE, RIGHT AXILLARY #1, BIOPSY:  - One lymph node, negative for carcinoma (0/1).   D. SENTINEL LYMPH NODE, RIGHT AXILLARY #2, BIOPSY:  - One lymph node, negative for carcinoma (0/1).   E. SENTINEL LYMPH NODE, RIGHT AXILLARY  #3, BIOPSY:  - One lymph node, negative for carcinoma (0/1).     Estrogen Receptor: 95%, positive, strong             Progesterone Receptor: 0%, negative             HER2: Positive (3+)             Ki-67: 50%    09/05/2019 Cancer Staging   Staging form: Breast, AJCC 8th Edition - Pathologic stage from 09/05/2019: Stage IA (pT1c, pN0, cM0, G3, ER+, PR-, HER2+) - Signed by Gardenia Phlegm, NP on 09/17/2019   10/22/2019 Surgery   Excision of right breast wound and APPLICATION OF A-CELL, INTEGRA BWM OR CELLERATE by Dr Marla Roe on 10/22/19   10/27/2019 -  Chemotherapy   Weekly Taxol and Herceptin for 12 weeks starting 10/27/19 then maintenance Herceptin q3weeks to complete 1 year treatment. Given recent right breast infection and debridement, will start Herceptin on 10/27/19 with loading dose (q2weeks) and add Taxol with week 3 on 11/10/19 then continuing with both weekly.      CURRENT THERAPY: Weekly Taxol and Herceptinfor12 weeksstarting 8/2/21then maintenance Herceptin(Kanjinti) q3weeksto complete 1 yeartreatment. Due to tinnitus Kanjinti changed to trastuzumab-qyyp Trazimera on 12/25/19    INTERVAL HISTORY: Stephanie Chase returns for f/u and treatment as scheduled. Kanjinti was changed to trastuzumab-qyyp Trazimera on 8/30 due to tinnitus, she received loading dose.  She has mild nausea and constipation that are managed with supportive meds.  She had 1 episode of blood with BM after straining.  She continues to have tinnitus that is "really loud" at times then otherwise low to moderate.  Denies hearing loss or significant pain.  She has seen ENT in the past but cannot remember who or where.  She has occasional dried blood in her nose but no active bleeding.  For the past 2-3 weeks she has new skin lesions, she questions if it could be discoid lupus.  She has had outbreaks before and requires topical steroid cream.  Denies other rash on her chest, trunk, back, arms.  No fever or chills, no  cough, chest pain, dyspnea, leg edema, neuropathy.    MEDICAL HISTORY:  Past Medical History:  Diagnosis Date  . Anemia   . Anxiety   . Asthma   . Breast cancer ([place])   . COPD (chronic obstructive pulmonary disease) ([place])   . Discoid lupus   . Family history of bladder cancer   . Family history of BRCA gene mutation   . Family history of breast cancer   . Family history of prostate cancer   . GERD (gastroesophageal reflux disease)   . Hypothyroidism   . Pneumonia   . Thyroid disease     SURGICAL HISTORY: Past Surgical History:  Procedure Laterality Date  . ABLATION ON ENDOMETRIOSIS    . APPLICATION OF A-CELL OF EXTREMITY Right 10/22/2019   Procedure: EXCISION OF RIGHT BREAST WOUND WITH PRIMARY CLOSURE;  Surgeon: [name] Going, DO;  Location: [facility];  Service: Plastics;  Laterality: Right;  . BREAST LUMPECTOMY    . DEBRIDEMENT AND CLOSURE WOUND Right 10/22/2019   Procedure: Excision of right breast wound;  Surgeon: [name] Going, DO;  Location: [facility];  Service: Plastics;  Laterality: Right;  . MASTECTOMY W/ SENTINEL NODE BIOPSY Bilateral 09/05/2019   Procedure: BILATERAL MASTECTOMY WITH RIGHT SENTINEL LYMPH NODE BIOPSY;  Surgeon: Stark Klein, MD;  Location: [facility];  Service: General;  Laterality: Bilateral;  COMBINED WITH REGIONAL FOR POST OP PAIN  . PORTACATH PLACEMENT Left 09/05/2019   Procedure: INSERTION PORT-A-CATH WITH ULTRASOUND GUIDANCE;  Surgeon: Stark Klein, MD;  Location: [facility];  Service: General;  Laterality: Left;  . TONSILLECTOMY    . TUBAL LIGATION    . WISDOM TOOTH EXTRACTION      I have reviewed the social history and family history with the patient and they are unchanged from previous note.  ALLERGIES:  is allergic to bactrim [sulfamethoxazole-trimethoprim].  MEDICATIONS:  Current Outpatient Medications  Medication Sig Dispense Refill  . acetaminophen (TYLENOL) 500 MG tablet Take 500-1,000 mg by mouth every 6 (six) hours as needed for  moderate pain.     . Albuterol Sulfate (PROAIR RESPICLICK) 397 (90 Base) MCG/ACT AEPB Inhale 2 puffs into the lungs every 6 (six) hours as needed. (Patient taking differently: Inhale 2 puffs into the lungs every 6 (six) hours as needed (SOB). ) 1 each 3  . ALPRAZolam (XANAX) 0.25 MG tablet Take 1 tablet (0.25 mg total) by mouth daily as needed for anxiety. 30 tablet 0  . Ascorbic Acid (VITAMIN C) 1000 MG tablet Take 1,000 mg by mouth daily.    . cetirizine (ZYRTEC) 10 MG tablet Take 10 mg by mouth daily as needed for allergies.     . Cholecalciferol (VITAMIN D) 50 MCG (2000 UT) tablet Take 2,000 Units by mouth daily.    Marland Kitchen dextromethorphan (DELSYM) 30 MG/5ML liquid Take 60 mg by mouth 2 (two) times daily as needed for cough.     . fluticasone (FLONASE) 50 MCG/ACT nasal spray Place 1 spray into both nostrils daily as needed for allergies.     Marland Kitchen  fluticasone (FLONASE) 50 MCG/ACT nasal spray Place 2 sprays into both nostrils in the morning and at bedtime. 16 g 5  . levothyroxine (SYNTHROID) 75 MCG tablet Take 75 mcg by mouth daily before breakfast.    . lidocaine-prilocaine (EMLA) cream Apply 1 application topically as needed. (Patient taking differently: Apply 1 application topically daily as needed (port access). ) 30 g 1  . liothyronine (CYTOMEL) 5 MCG tablet TAKE 2 TABLETS (=10MCG     TOTAL)     DAILY (Patient taking differently: Take 10 mcg by mouth daily. ) 180 tablet 1  . methocarbamol (ROBAXIN) 500 MG tablet Take 1 tablet (500 mg total) by mouth every 6 (six) hours as needed (use for muscle cramps/pain). 20 tablet 1  . Multiple Vitamin (MULTIVITAMIN WITH MINERALS) TABS tablet Take 1 tablet by mouth 2 (two) times daily.     . nicotine (NICODERM CQ - DOSED IN MG/24 HOURS) 21 mg/24hr patch Place 21 mg onto the skin daily.    . ondansetron (ZOFRAN) 8 MG tablet Take 1 tablet (8 mg total) by mouth 2 (two) times daily as needed (Nausea or vomiting). 30 tablet 1  . OVER THE COUNTER MEDICATION Take 1 each  by mouth 2 (two) times daily as needed (anxiety / sleep). CBD Gummies    . oxyCODONE (OXY IR/ROXICODONE) 5 MG immediate release tablet Take 1 tablet (5 mg total) by mouth every 6 (six) hours as needed for breakthrough pain. 20 tablet 0  . prochlorperazine (COMPAZINE) 10 MG tablet Take 1 tablet (10 mg total) by mouth every 6 (six) hours as needed (Nausea or vomiting). 30 tablet 1  . Zinc Sulfate 220 (50 Zn) MG TABS TAKE 1 TABLET (220 MG TOTAL) BY MOUTH IN THE MORNING AND AT BEDTIME. 60 tablet 0   No current facility-administered medications for this visit.   Facility-Administered Medications Ordered in Other Visits  Medication Dose Route Frequency Provider Last Rate Last Admin  . alum & mag hydroxide-simeth (MAALOX/MYLANTA) 200-200-20 MG/5ML suspension 30 mL  30 mL Oral Once Sandi Mealy E., PA-C      . sodium chloride flush (NS) 0.9 % injection 10 mL  10 mL Intracatheter PRN Truitt Merle, MD   10 mL at 11/26/19 1624  . sodium chloride flush (NS) 0.9 % injection 10 mL  10 mL Intracatheter PRN Truitt Merle, MD   10 mL at 12/02/19 1454    PHYSICAL EXAMINATION: ECOG PERFORMANCE STATUS: 1 - Symptomatic but completely ambulatory  Vitals:   12/02/19 1106  BP: (!) 143/86  Pulse: 69  Resp: 17  Temp: 98 F (36.7 C)  SpO2: 100%   Filed Weights   12/02/19 1106  Weight: 161 lb 4.8 oz (73.2 kg)    GENERAL:alert, no distress and comfortable SKIN: 2-3 round raised erythematous skin eruptions over the nose and temporomandibular areas bilaterally   EYES: sclera clear LUNGS:  normal breathing effort HEART: no lower extremity edema NEURO: alert & oriented x 3 with fluent speech Breast: s/p bilateral mastectomy, incisions healed well. No erythema or drainage  PAC without erythema   LABORATORY DATA:  I have reviewed the data as listed CBC Latest Ref Rng & Units 12/02/2019 11/26/2019 11/24/2019  WBC 4.0 - 10.5 K/uL 7.5 6.3 8.0  Hemoglobin 12.0 - 15.0 g/dL 12.8 12.2 13.2  Hematocrit 36 - 46 % 38.7 36.5 39.7   Platelets 150 - 400 K/uL 323 290 298     CMP Latest Ref Rng & Units 12/02/2019 11/26/2019 11/24/2019  Glucose 70 -  99 mg/dL 86 86 89  BUN 6 - 20 mg/dL '6 9 9  ' Creatinine 0.44 - 1.00 mg/dL 0.67 0.63 0.77  Sodium 135 - 145 mmol/L 137 138 136  Potassium 3.5 - 5.1 mmol/L 4.1 4.0 4.0  Chloride 98 - 111 mmol/L 106 105 103  CO2 22 - 32 mmol/L '24 26 24  ' Calcium 8.9 - 10.3 mg/dL 9.9 10.4(H) 11.1(H)  Total Protein 6.5 - 8.1 g/dL 7.3 7.0 7.3  Total Bilirubin 0.3 - 1.2 mg/dL 0.3 <0.2(L) 0.4  Alkaline Phos 38 - 126 U/L 121 116 124  AST 15 - 41 U/L 46(H) 32 45(H)  ALT 0 - 44 U/L 57(H) 62(H) 62(H)      RADIOGRAPHIC STUDIES: I have personally reviewed the radiological images as listed and agreed with the findings in the report. No results found.   ASSESSMENT & PLAN: Stephanie Chase a 59 y.o.femalewith   1.Malignant neoplasm of upper-outer quadrant of right breast,invasive ductal carcinoma and DCIS,StageIA,pT1cN0M0,ER+/HER2+, PR-,Grade III -She was diagnosed in 06/2019.Given multifocal disease, she underwent right mastectomy and SLNBandprophylactic left mastectomyby Dr Barry Dienes on 09/05/19.Left chest port placed during surgery. -Due to node-negative, postmastectomy radiation is not recommended -Given her HER2 positive high grade disease, adjuvant chemotherapy is recommended to reduce her high risk of recurrence. Given her small tumorand negative node,Dr. Burr Medico recommendedless intensiveadjuvantchemotherapy with weekly Taxol and anti-HER2 Herceptin for 12 weeks and continue maintenance Herceptinq3weeksalone to complete 1 year treatment.She has the option of Herceptin injections after chemo.She consented to treatment. Goal is curative. -plan to begin adjuvant anti-estrogen therapy after chemo -due to right mastectomy wound necrosis s/p debridement, chemo was postponed; she started herceptin with loading dose on 8/2, then skipped a week and added taxol with week 3  2.Right  mastectomy wound  -She had been healing well from bilateral mastectomy on 6/11 per Dr. Barry Dienes until she developed yellow tissue and mild drainage on 7/3 -Seen at urgent care on 7/3 and was treated for cellulitis with Bactrim -Two days later she developed a rash on her chest, back, and armsfelt to be from bactrim,with redness at the incision. She was told to go to the ED for evaluation -Images in the ED consistent with ischemic tissue, no concern for cellulitis. Bactrim was stopped and rash resolved -[place] 10/02/19 showed small area ofnecrosis to the central incision, no signs of infection. See image.  -s/p wound debridement by Dr. Audelia Hives on 10/22/19, she is healing very well.  -She began adjuvant treatment with loading dose herceptin only on 10/27/19, then skipped a week; taxol added with week 3  3. Genetic Testing  -She has family history of breast, bladder, prostate cancer. -She does have negative genetic testing: No pathogenic variants detected on the [facility] Breast Cancer STAT panel + Common Hereditary Cancers panel. A variant of uncertain significance (VUS) was detected in the ATM gene called c.3834C>A. The report date is 08/10/2019.She can repeat testing in 5-10 years.  4. Smoking Cessation, COPD -She has been smoking since the 2nd grade. She has been able to cut down recentlyto half pack per day.  -Per pt at some point shewas diagnosed with COPD or Emphysema. She is on albuterol inhaler and Singulair. -Her wound necrosis ispossiblyrelated to smoking -she has cut back, using nicotine patch. She was down to 5-6 cigarettes per day but went back to just under a pack,   5. Comorbidities:DiscoidLupus, Vitiligo, Thyroid disease.  -She was diagnosed with Lupus 10 years ago. She uses steroid cream as needed for her lupus rashes,  some skin lesions of which have been removed. -She sees her dermatologist Dr Nevada Crane and their [place].  -She is on 2 thyroid  medications.  6. Social Support, Anxiety  -Ongoing -Startedonlow doseXanaxPRN5/07/2019, okay to take before chemo. -She previously declined social work referral    Disposition: Ms. Loseke appears stable.  She has completed fourth cycle of weekly trastuzumab biosimilar and third weekly Taxol.  She tolerates treatment moderately well overall with mild nausea and constipation.  Symptoms are managed with supportive care at home.  She is able to recover and function well.   She has persistent tinnitus which began on treatment, no change after switching from [place] to [place].  We are referring her to ENT for further evaluation.  If this is felt to be related to trastuzumab/biosimilar we will likely change the formulation.  She has also developed a skin eruption on her face/nose which does not appear to be in the typical distribution for a Taxol/chemo drug reaction.  This is suspicious for a lupus flare, I recommend for her to follow-up with her dermatologist.  I will fax my note.  We reviewed her recent echo and the CBC and CMP from today.  She will proceed with another cycle of Taxol and Kanjinti today as planned.  Follow-up next week prior to treatment to review recommendations from specialists.   No problem-specific Assessment & Plan notes found for this encounter.   Orders Placed This Encounter  Procedures  . Ambulatory referral to ENT    Referral Priority:   Routine    Referral Type:   Consultation    Referral Reason:   Specialty Services Required    Requested Specialty:   Otolaryngology    Number of Visits Requested:   1   All questions were answered. The patient knows to call the clinic with any problems, questions or concerns. No barriers to learning was detected.     Alla Feeling, NP 12/02/19

## 2019-12-02 ENCOUNTER — Inpatient Hospital Stay: Payer: BC Managed Care – PPO

## 2019-12-02 ENCOUNTER — Other Ambulatory Visit: Payer: Self-pay

## 2019-12-02 ENCOUNTER — Encounter: Payer: Self-pay | Admitting: Nurse Practitioner

## 2019-12-02 ENCOUNTER — Inpatient Hospital Stay (HOSPITAL_BASED_OUTPATIENT_CLINIC_OR_DEPARTMENT_OTHER): Payer: BC Managed Care – PPO | Admitting: Nurse Practitioner

## 2019-12-02 VITALS — BP 143/86 | HR 69 | Temp 98.0°F | Resp 17 | Ht 65.0 in | Wt 161.3 lb

## 2019-12-02 DIAGNOSIS — Z170 Estrogen receptor positive status [ER+]: Secondary | ICD-10-CM

## 2019-12-02 DIAGNOSIS — Z5112 Encounter for antineoplastic immunotherapy: Secondary | ICD-10-CM | POA: Diagnosis not present

## 2019-12-02 DIAGNOSIS — C50411 Malignant neoplasm of upper-outer quadrant of right female breast: Secondary | ICD-10-CM

## 2019-12-02 LAB — CMP (CANCER CENTER ONLY)
ALT: 57 U/L — ABNORMAL HIGH (ref 0–44)
AST: 46 U/L — ABNORMAL HIGH (ref 15–41)
Albumin: 3.8 g/dL (ref 3.5–5.0)
Alkaline Phosphatase: 121 U/L (ref 38–126)
Anion gap: 7 (ref 5–15)
BUN: 6 mg/dL (ref 6–20)
CO2: 24 mmol/L (ref 22–32)
Calcium: 9.9 mg/dL (ref 8.9–10.3)
Chloride: 106 mmol/L (ref 98–111)
Creatinine: 0.67 mg/dL (ref 0.44–1.00)
GFR, Est AFR Am: >60 mL/min (ref >60)
GFR, Estimated: >60 mL/min (ref >60)
Glucose, Bld: 86 mg/dL (ref 70–99)
Potassium: 4.1 mmol/L (ref 3.5–5.1)
Sodium: 137 mmol/L (ref 135–145)
Total Bilirubin: 0.3 mg/dL (ref 0.3–1.2)
Total Protein: 7.3 g/dL (ref 6.5–8.1)

## 2019-12-02 LAB — CBC WITH DIFFERENTIAL (CANCER CENTER ONLY)
Abs Immature Granulocytes: 0.05 10*3/uL (ref 0.00–0.07)
Basophils Absolute: 0.1 10*3/uL (ref 0.0–0.1)
Basophils Relative: 1 %
Eosinophils Absolute: 0 10*3/uL (ref 0.0–0.5)
Eosinophils Relative: 0 %
HCT: 38.7 % (ref 36.0–46.0)
Hemoglobin: 12.8 g/dL (ref 12.0–15.0)
Immature Granulocytes: 1 %
Lymphocytes Relative: 28 %
Lymphs Abs: 2.1 10*3/uL (ref 0.7–4.0)
MCH: 28.6 pg (ref 26.0–34.0)
MCHC: 33.1 g/dL (ref 30.0–36.0)
MCV: 86.4 fL (ref 80.0–100.0)
Monocytes Absolute: 0.6 10*3/uL (ref 0.1–1.0)
Monocytes Relative: 8 %
Neutro Abs: 4.7 10*3/uL (ref 1.7–7.7)
Neutrophils Relative %: 62 %
Platelet Count: 323 10*3/uL (ref 150–400)
RBC: 4.48 MIL/uL (ref 3.87–5.11)
RDW: 13.3 % (ref 11.5–15.5)
WBC Count: 7.5 10*3/uL (ref 4.0–10.5)
nRBC: 0 % (ref 0.0–0.2)

## 2019-12-02 MED ORDER — ACETAMINOPHEN 325 MG PO TABS
ORAL_TABLET | ORAL | Status: AC
  Filled 2019-12-02: qty 2

## 2019-12-02 MED ORDER — FAMOTIDINE IN NACL 20-0.9 MG/50ML-% IV SOLN
INTRAVENOUS | Status: AC
  Filled 2019-12-02: qty 50

## 2019-12-02 MED ORDER — ACETAMINOPHEN 325 MG PO TABS
650.0000 mg | ORAL_TABLET | Freq: Once | ORAL | Status: AC
  Administered 2019-12-02: 650 mg via ORAL

## 2019-12-02 MED ORDER — DIPHENHYDRAMINE HCL 50 MG/ML IJ SOLN
INTRAMUSCULAR | Status: AC
  Filled 2019-12-02: qty 1

## 2019-12-02 MED ORDER — SODIUM CHLORIDE 0.9% FLUSH
10.0000 mL | INTRAVENOUS | Status: DC | PRN
  Administered 2019-12-02: 10 mL
  Filled 2019-12-02: qty 10

## 2019-12-02 MED ORDER — SODIUM CHLORIDE 0.9 % IV SOLN
80.0000 mg/m2 | Freq: Once | INTRAVENOUS | Status: AC
  Administered 2019-12-02: 144 mg via INTRAVENOUS
  Filled 2019-12-02: qty 24

## 2019-12-02 MED ORDER — SODIUM CHLORIDE 0.9 % IV SOLN
40.0000 mg | Freq: Once | INTRAVENOUS | Status: AC
  Administered 2019-12-02: 40 mg via INTRAVENOUS
  Filled 2019-12-02: qty 4

## 2019-12-02 MED ORDER — SODIUM CHLORIDE 0.9 % IV SOLN
Freq: Once | INTRAVENOUS | Status: AC
  Filled 2019-12-02: qty 250

## 2019-12-02 MED ORDER — TRASTUZUMAB-QYYP CHEMO 420 MG IV SOLR
150.0000 mg | Freq: Once | INTRAVENOUS | Status: AC
  Administered 2019-12-02: 150 mg via INTRAVENOUS
  Filled 2019-12-02: qty 7.14

## 2019-12-02 MED ORDER — HEPARIN SOD (PORK) LOCK FLUSH 100 UNIT/ML IV SOLN
500.0000 [IU] | Freq: Once | INTRAVENOUS | Status: AC | PRN
  Administered 2019-12-02: 500 [IU]
  Filled 2019-12-02: qty 5

## 2019-12-02 MED ORDER — DIPHENHYDRAMINE HCL 50 MG/ML IJ SOLN
25.0000 mg | Freq: Once | INTRAMUSCULAR | Status: AC
  Administered 2019-12-02: 25 mg via INTRAVENOUS

## 2019-12-02 MED ORDER — SODIUM CHLORIDE 0.9 % IV SOLN
20.0000 mg | Freq: Once | INTRAVENOUS | Status: AC
  Administered 2019-12-02: 20 mg via INTRAVENOUS
  Filled 2019-12-02: qty 20

## 2019-12-02 NOTE — Telephone Encounter (Signed)
Rx approved and sent to the pharmacy.//AB/CMA

## 2019-12-02 NOTE — Progress Notes (Signed)
[facility] Progress Note   Stephanie Chase [id number] 02/12/1961 59 y.o.  Stephanie Chase is managed by Dr. Truitt Merle  Actively treated with chemotherapy/immunotherapy/hormonal therapy: yes  Current therapy: Taxol and Trazimera  Last treated: 11/24/2019 (cycle 2, day 1)  Next scheduled appointment with provider: 12/02/2019  Assessment: Plan:    Malignant neoplasm of upper-outer quadrant of right female breast, unspecified estrogen receptor status ([place]) - Plan: heparin lock flush 100 unit/mL, sodium chloride flush (NS) 0.9 % injection 10 mL, DISCONTINUED: sodium chloride flush (NS) 0.9 % injection 10 mL  Mild epistaxis  Hypothyroidism, unspecified type   Malignant neoplasm of the right breast: The patient is status post cycle 2, day 1 of Taxol and Trazimera which was dosed on 11/24/2019.  She is scheduled to return for her next visit on 12/02/2019.  Mild epistaxis with possible eustachian tube dysfunction: Stephanie Chase was instructed to use Afrin Nasal Spray, 2 sprays in each nostril twice daily for no more than 3 days immediately followed by Flonase 1 to 2 sprays in each nostril twice daily.  She was also told to begin using a glaze of Vaseline inside her nares at night.  Hypothyroidism: A TSH will be added to upcoming labs.  Please see After Visit Summary for patient specific instructions.  Future Appointments  Date Time Provider [place]  12/03/2019 11:00 AM Collie Siad A, PTA [facility] None  12/05/2019 11:00 AM Collie Siad A, PTA [facility] None  12/08/2019  8:45 AM [facility] LAB [facility] None  12/08/2019  9:00 AM [facility] [place] FLUSH [facility] None  12/08/2019  9:30 AM [facility] INFUSION [facility] None  12/09/2019  8:00 AM Collie Siad A, PTA [facility] None  12/11/2019 11:00 AM Collie Siad A, PTA [facility] None  12/15/2019  8:30 AM [facility] LAB [facility] None  12/15/2019  8:45 AM [facility] [place] FLUSH [facility]  None  12/15/2019  9:20 AM Truitt Merle, MD [facility] None  12/15/2019 10:30 AM [facility] INFUSION [facility] None  12/16/2019  8:00 AM Collie Siad A, PTA [facility] None  12/18/2019 10:00 AM Collie Siad A, PTA [facility] None  12/22/2019  8:15 AM [facility] LAB [facility] None  12/22/2019  8:30 AM [facility] [place] FLUSH [facility] None  12/22/2019  9:30 AM [facility] INFUSION [facility] None  12/23/2019  9:00 AM Collie Siad A, PTA [facility] None  12/25/2019  9:00 AM Collie Siad A, PTA [facility] None  12/29/2019  8:00 AM [facility] LAB [facility] None  12/29/2019  8:15 AM [facility] [place] FLUSH [facility] None  12/29/2019  8:45 AM Cira Rue K, NP [facility] None  12/29/2019  9:30 AM [facility] INFUSION [facility] None  01/05/2020  8:30 AM [facility] LAB [facility] None  01/05/2020  8:45 AM [facility] [place] FLUSH [facility] None  01/05/2020  9:30 AM [facility] INFUSION [facility] None  01/12/2020 12:30 PM [facility] LAB [facility] None  01/12/2020 12:45 PM [facility] [place] None  01/12/2020  1:15 PM Cira Rue K, NP [facility] None  01/12/2020  2:00 PM [facility] INFUSION [facility] None  02/26/2020  9:00 AM [facility] ECHO OP 1 [facility]  02/26/2020 10:00 AM Bensimhon, Shaune Pascal, MD [facility] None    No orders of the defined types were placed in this encounter.      Subjective:   Patient ID:  Stephanie Chase is a 35 y.o. (DOB [birth year]) female.  Chief Complaint: No chief complaint on file.   HPI Stephanie Chase  is a 59 y.o. female with a diagnosis of a malignant neoplasm of the right breast.  She  is followed by Dr. Truitt Merle and is status post cycle 2, day 1 of Taxol and Trazimera which was dosed on 11/24/2019.  She presents to the clinic today with a report of mild epistaxis.  She reports having some fullness and popping in her ears.  She denies any other symptoms of concern.  She denies fevers, chills, sweats, headache, nausea, vomiting,  constipation, or diarrhea.  She reports that she has a history of hypothyroidism and needs to have her TSH checked.   Medications: I have reviewed the patient's current medications.  Allergies:  Allergies  Allergen Reactions  . Bactrim [Sulfamethoxazole-Trimethoprim] Itching and Rash    Past Medical History:  Diagnosis Date  . Anemia   . Anxiety   . Asthma   . Breast cancer ([place])   . COPD (chronic obstructive pulmonary disease) ([place])   . Discoid lupus   . Family history of bladder cancer   . Family history of BRCA gene mutation   . Family history of breast cancer   . Family history of prostate cancer   . GERD (gastroesophageal reflux disease)   . Hypothyroidism   . Pneumonia   . Thyroid disease     Past Surgical History:  Procedure Laterality Date  . ABLATION ON ENDOMETRIOSIS    . APPLICATION OF A-CELL OF EXTREMITY Right 10/22/2019   Procedure: EXCISION OF RIGHT BREAST WOUND WITH PRIMARY CLOSURE;  Surgeon: [name] Going, DO;  Location: [place];  Service: Plastics;  Laterality: Right;  . BREAST LUMPECTOMY    . DEBRIDEMENT AND CLOSURE WOUND Right 10/22/2019   Procedure: Excision of right breast wound;  Surgeon: [name] Going, DO;  Location: [place];  Service: Plastics;  Laterality: Right;  . MASTECTOMY W/ SENTINEL NODE BIOPSY Bilateral 09/05/2019   Procedure: BILATERAL MASTECTOMY WITH RIGHT SENTINEL LYMPH NODE BIOPSY;  Surgeon: Stark Klein, MD;  Location: [place];  Service: General;  Laterality: Bilateral;  COMBINED WITH REGIONAL FOR POST OP PAIN  . PORTACATH PLACEMENT Left 09/05/2019   Procedure: INSERTION PORT-A-CATH WITH ULTRASOUND GUIDANCE;  Surgeon: Stark Klein, MD;  Location: [place];  Service: General;  Laterality: Left;  . TONSILLECTOMY    . TUBAL LIGATION    . WISDOM TOOTH EXTRACTION      Family History  Problem Relation Age of Onset  . Breast cancer Mother 42  . Diabetes Mother   . Bladder Cancer Mother 63       ureteral cancer  . Cancer Mother         blood cancer  . Heart attack Father   . Hyperlipidemia Father   . Hypertension Father   . Alzheimer's disease Father   . Prostate cancer Father        dx. >50  . Alzheimer's disease Other   . Cancer Maternal Uncle        prostate or colon  . BRCA 1/2 Daughter   . Breast cancer Other 45       maternal great-aunt  . Cancer Cousin        unknown type; dx. in her 23s (maternal cousin)    Social History   Socioeconomic History  . Marital status: Divorced    Spouse name: Not on file  . Number of children: 2  . Years of education: Not on file  . Highest education level: Not on file  Occupational History  . Not on file  Tobacco Use  . Smoking status: Current Every Day Smoker    Packs/day: 1.00  Years: 50.00    Pack years: 50.00    Types: Cigarettes  . Smokeless tobacco: Never Used  Vaping Use  . Vaping Use: Never used  Substance and Sexual Activity  . Alcohol use: Yes    Comment: once a month  . Drug use: No  . Sexual activity: Yes    Partners: Male    Birth control/protection: Surgical    Comment: BTL and ablation  Other Topics Concern  . Not on file  Social History Narrative  . Not on file   Social Determinants of Health   Financial Resource Strain:   . Difficulty of Paying Living Expenses: Not on file  Food Insecurity:   . Worried About Charity fundraiser in the Last Year: Not on file  . Ran Out of Food in the Last Year: Not on file  Transportation Needs:   . Lack of Transportation (Medical): Not on file  . Lack of Transportation (Non-Medical): Not on file  Physical Activity:   . Days of Exercise per Week: Not on file  . Minutes of Exercise per Session: Not on file  Stress:   . Feeling of Stress : Not on file  Social Connections:   . Frequency of Communication with Friends and Family: Not on file  . Frequency of Social Gatherings with Friends and Family: Not on file  . Attends Religious Services: Not on file  . Active Member of Clubs or Organizations:  Not on file  . Attends Archivist Meetings: Not on file  . Marital Status: Not on file  Intimate Partner Violence:   . Fear of Current or Ex-Partner: Not on file  . Emotionally Abused: Not on file  . Physically Abused: Not on file  . Sexually Abused: Not on file    Past Medical History, Surgical history, Social history, and Family history were reviewed and updated as appropriate.   Please see review of systems for further details on the patient's review from today.   Review of Systems:  Review of Systems  Constitutional: Negative for chills, diaphoresis and fever.  HENT: Positive for nosebleeds. Negative for hearing loss, postnasal drip, sinus pressure, sinus pain, sore throat and trouble swallowing.        Ear popping and fullness  Respiratory: Negative for cough and shortness of breath.   Neurological: Negative for light-headedness and headaches.    Objective:   Physical Exam:  BP 121/86 (BP Location: Left Arm, Patient Position: Sitting)   Pulse 77   Temp (!) 96.8 F (36 C) (Tympanic)   Resp 18   Ht '5\' 5"'  (1.651 m)   Wt 162 lb 8 oz (73.7 kg)   SpO2 100% Comment: RA  BMI 27.04 kg/m  ECOG: 0  Physical Exam Constitutional:      General: She is not in acute distress.    Appearance: Normal appearance. She is not ill-appearing or diaphoretic.  HENT:     Head: Normocephalic and atraumatic.     Right Ear: Tympanic membrane, ear canal and external ear normal.     Left Ear: Tympanic membrane, ear canal and external ear normal.     Nose:     Comments: Small areas of crusting of dried red blood in the anterior medial nares. No tenderness over the frontal and maxillary sinuses. Eyes:     General: No scleral icterus.       Right eye: No discharge.        Left eye: No discharge.  Conjunctiva/sclera: Conjunctivae normal.  Cardiovascular:     Rate and Rhythm: Normal rate and regular rhythm.  Pulmonary:     Effort: Pulmonary effort is normal. No respiratory  distress.     Breath sounds: No wheezing or rales.  Neurological:     Mental Status: She is alert.     Coordination: Coordination normal.     Gait: Gait normal.  Psychiatric:        Mood and Affect: Mood normal.        Behavior: Behavior normal.        Thought Content: Thought content normal.        Judgment: Judgment normal.     Lab Review:     Component Value Date/Time   NA 137 12/02/2019 1055   K 4.1 12/02/2019 1055   CL 106 12/02/2019 1055   CO2 24 12/02/2019 1055   GLUCOSE 86 12/02/2019 1055   BUN 6 12/02/2019 1055   CREATININE 0.67 12/02/2019 1055   CREATININE 0.66 09/12/2017 0747   CALCIUM 9.9 12/02/2019 1055   PROT 7.3 12/02/2019 1055   ALBUMIN 3.8 12/02/2019 1055   AST 46 (H) 12/02/2019 1055   ALT 57 (H) 12/02/2019 1055   ALKPHOS 121 12/02/2019 1055   BILITOT 0.3 12/02/2019 1055   GFRNONAA >60 12/02/2019 1055   GFRNONAA >89 09/29/2013 1424   GFRAA >60 12/02/2019 1055   GFRAA >89 09/29/2013 1424       Component Value Date/Time   WBC 7.5 12/02/2019 1055   WBC 8.1 10/22/2019 1430   RBC 4.48 12/02/2019 1055   HGB 12.8 12/02/2019 1055   HCT 38.7 12/02/2019 1055   PLT 323 12/02/2019 1055   MCV 86.4 12/02/2019 1055   MCH 28.6 12/02/2019 1055   MCHC 33.1 12/02/2019 1055   RDW 13.3 12/02/2019 1055   LYMPHSABS 2.1 12/02/2019 1055   MONOABS 0.6 12/02/2019 1055   EOSABS 0.0 12/02/2019 1055   BASOSABS 0.1 12/02/2019 1055   -------------------------------  Imaging from last 24 hours (if applicable):  Radiology interpretation: ECHOCARDIOGRAM LIMITED  Result Date: 11/24/2019    ECHOCARDIOGRAM LIMITED REPORT   Patient Name:   DAMYAH GUGEL Date of Exam: 11/24/2019 Medical Rec #:  [id number]       Height:       65.0 in Accession #:    [id number]      Weight:       159.1 lb Date of Birth:  June 29, 1960       BSA:          1.795 m Patient Age:    72 years        BP:           127/80 mmHg Patient Gender: F               HR:           78 bpm. Exam Location:  Outpatient  Procedure: Limited Echo, Color Doppler, Cardiac Doppler and 3D Echo Indications:    Chemo Evaluation  History:        Patient has prior history of Echocardiogram examinations, most                 recent 08/04/2019. COPD; Risk Factors:Dyslipidemia.  Sonographer:    Raquel Sarna Senior RDCS Referring Phys: [id number] DANIEL R BENSIMHON IMPRESSIONS  1. Left ventricular ejection fraction, by estimation, is 60 to 65%. The left ventricle has normal function. The left ventricle has no regional wall motion abnormalities. Left ventricular diastolic parameters were normal.  2. Right ventricular systolic function is normal. The right ventricular size is normal.  3. Left atrial size was mildly dilated.  4. Right atrial size was mildly dilated.  5. The mitral valve is normal in structure. Trivial mitral valve regurgitation.  6. The inferior vena cava is normal in size with greater than 50% respiratory variability, suggesting right atrial pressure of 3 mmHg. FINDINGS  Left Ventricle: Left ventricular ejection fraction, by estimation, is 60 to 65%. The left ventricle has normal function. The left ventricle has no regional wall motion abnormalities. There is no left ventricular hypertrophy. Right Ventricle: The right ventricular size is normal. Right ventricular systolic function is normal. Left Atrium: Left atrial size was mildly dilated. Right Atrium: Right atrial size was mildly dilated. Pericardium: There is no evidence of pericardial effusion. Mitral Valve: The mitral valve is normal in structure. Trivial mitral valve regurgitation. Pulmonic Valve: The pulmonic valve was not well visualized. Pulmonic valve regurgitation is not visualized. Aorta: The aortic root and ascending aorta are structurally normal, with no evidence of dilitation. Venous: The inferior vena cava is normal in size with greater than 50% respiratory variability, suggesting right atrial pressure of 3 mmHg. IAS/Shunts: The interatrial septum appears to be lipomatous. LEFT  VENTRICLE PLAX 2D LVIDd:         4.10 cm  Diastology LVIDs:         2.60 cm  LV e' lateral:   11.40 cm/s LV PW:         1.00 cm  LV E/e' lateral: 6.0 LV IVS:        0.90 cm  LV e' medial:    10.60 cm/s LVOT diam:     2.10 cm  LV E/e' medial:  6.4 LV SV:         75 LV SV Index:   42       2D Longitudinal Strain LVOT Area:     3.46 cm 2D Strain GLS Avg:     -20.7 %                          3D Volume EF:                         3D EF:        64 %                         LV EDV:       121 ml                         LV ESV:       43 ml                         LV SV:        78 ml RIGHT VENTRICLE RV S prime:     11.30 cm/s TAPSE (M-mode): 2.1 cm LEFT ATRIUM             Index       RIGHT ATRIUM           Index LA diam:        3.20 cm 1.78 cm/m  RA Area:     16.40 cm LA Vol (A2C):   47.6 ml 26.52 ml/m RA Volume:   42.80 ml  23.85 ml/m LA Vol (  A4C):   36.5 ml 20.34 ml/m LA Biplane Vol: 43.3 ml 24.12 ml/m  AORTIC VALVE LVOT Vmax:   98.60 cm/s LVOT Vmean:  72.900 cm/s LVOT VTI:    0.217 m  AORTA Ao Root diam: 3.10 cm Ao Asc diam:  3.00 cm MITRAL VALVE MV Area (PHT): 3.31 cm    SHUNTS MV Decel Time: 229 msec    Systemic VTI:  0.22 m MV E velocity: 68.10 cm/s  Systemic Diam: 2.10 cm MV A velocity: 64.30 cm/s MV E/A ratio:  1.06 Glori Bickers MD Electronically signed by Glori Bickers MD Signature Date/Time: 11/24/2019/4:08:57 PM    Final

## 2019-12-02 NOTE — Patient Instructions (Signed)
[facility] Discharge Instructions for Patients Receiving Chemotherapy  Today you received the following chemotherapy agents: Trastuzumab, Paclitaxel  To help prevent nausea and vomiting after your treatment, we encourage you to take your nausea medication as directed.   If you develop nausea and vomiting that is not controlled by your nausea medication, call the clinic.   BELOW ARE SYMPTOMS THAT SHOULD BE REPORTED IMMEDIATELY:  *FEVER GREATER THAN 100.5 F  *CHILLS WITH OR WITHOUT FEVER  NAUSEA AND VOMITING THAT IS NOT CONTROLLED WITH YOUR NAUSEA MEDICATION  *UNUSUAL SHORTNESS OF BREATH  *UNUSUAL BRUISING OR BLEEDING  TENDERNESS IN MOUTH AND THROAT WITH OR WITHOUT PRESENCE OF ULCERS  *URINARY PROBLEMS  *BOWEL PROBLEMS  UNUSUAL RASH Items with * indicate a potential emergency and should be followed up as soon as possible.  Feel free to call the clinic should you have any questions or concerns. The clinic phone number is [phone].  Please show the CHEMO ALERT CARD at check-in to the Emergency Department and triage nurse.   

## 2019-12-03 ENCOUNTER — Ambulatory Visit: Payer: BC Managed Care – PPO

## 2019-12-03 DIAGNOSIS — C50411 Malignant neoplasm of upper-outer quadrant of right female breast: Secondary | ICD-10-CM | POA: Diagnosis not present

## 2019-12-03 DIAGNOSIS — M25612 Stiffness of left shoulder, not elsewhere classified: Secondary | ICD-10-CM

## 2019-12-03 DIAGNOSIS — Z170 Estrogen receptor positive status [ER+]: Secondary | ICD-10-CM

## 2019-12-03 DIAGNOSIS — M25611 Stiffness of right shoulder, not elsewhere classified: Secondary | ICD-10-CM

## 2019-12-03 DIAGNOSIS — M25511 Pain in right shoulder: Secondary | ICD-10-CM

## 2019-12-03 DIAGNOSIS — R0789 Other chest pain: Secondary | ICD-10-CM

## 2019-12-03 DIAGNOSIS — M25512 Pain in left shoulder: Secondary | ICD-10-CM

## 2019-12-03 DIAGNOSIS — R293 Abnormal posture: Secondary | ICD-10-CM

## 2019-12-03 NOTE — Patient Instructions (Signed)

## 2019-12-03 NOTE — Therapy (Signed)
[place], [place], [address] Phone: [phone]   Fax:  [phone]  Physical Therapy Treatment  Patient Details  Name: Stephanie Chase MRN: [id number] Date of Birth: [birth year] Referring Provider (PT): Dr. Stark Klein   Encounter Date: 12/03/2019   PT End of Session - 12/03/19 1219    Visit Number 9    Number of Visits 18    Date for PT Re-Evaluation 12/11/19    PT Start Time 1110    PT Stop Time 1206    PT Time Calculation (min) 56 min    Activity Tolerance Patient tolerated treatment well    Behavior During Therapy [facility] for tasks assessed/performed           Past Medical History:  Diagnosis Date  . Anemia   . Anxiety   . Asthma   . Breast cancer ([facility])   . COPD (chronic obstructive pulmonary disease) ([facility])   . Discoid lupus   . Family history of bladder cancer   . Family history of BRCA gene mutation   . Family history of breast cancer   . Family history of prostate cancer   . GERD (gastroesophageal reflux disease)   . Hypothyroidism   . Pneumonia   . Thyroid disease     Past Surgical History:  Procedure Laterality Date  . ABLATION ON ENDOMETRIOSIS    . APPLICATION OF A-CELL OF EXTREMITY Right 10/22/2019   Procedure: EXCISION OF RIGHT BREAST WOUND WITH PRIMARY CLOSURE;  Surgeon: [name] Going, DO;  Location: [facility];  Service: Plastics;  Laterality: Right;  . BREAST LUMPECTOMY    . DEBRIDEMENT AND CLOSURE WOUND Right 10/22/2019   Procedure: Excision of right breast wound;  Surgeon: [name] Going, DO;  Location: [facility];  Service: Plastics;  Laterality: Right;  . MASTECTOMY W/ SENTINEL NODE BIOPSY Bilateral 09/05/2019   Procedure: BILATERAL MASTECTOMY WITH RIGHT SENTINEL LYMPH NODE BIOPSY;  Surgeon: Stark Klein, MD;  Location: [facility];  Service: General;  Laterality: Bilateral;  COMBINED WITH REGIONAL FOR POST OP PAIN  . PORTACATH PLACEMENT Left 09/05/2019   Procedure:  INSERTION PORT-A-CATH WITH ULTRASOUND GUIDANCE;  Surgeon: Stark Klein, MD;  Location: [facility];  Service: General;  Laterality: Left;  . TONSILLECTOMY    . TUBAL LIGATION    . WISDOM TOOTH EXTRACTION      There were no vitals filed for this visit.   Subjective Assessment - 12/03/19 1113    Subjective I had my fourth chemo yesterday, but second with different regimen due to the tinitis I was having with my first 2 treatments. I was only able to sleep for 1 hour after the last and got 3 hours of sleep after yesterdays so I guess thats improvement. I don't have to wear the bandage over my incsion any longer so that''s nice and it's healing well.    Pertinent History Bil masectomy 09/05/19 with 3 lymph nodes removed and negative on the R and benign tisuse on the L. Patient was diagnosed on 06/09/2019 with right grade III invasive ductal carcinoma breast cancer. It mesures 1.3 cm in the upper outer quadrant with 1.6 cm of calcifications and DCIS. It is ER positive, PR negative, and HER2 positive with a Ki67 of 50%. She smokes about 1 pack/day.    Patient Stated Goals I want to get back to kayaking.    Currently in Pain? No/denies  [facility] Adult PT Treatment/Exercise - 12/03/19 0001      Shoulder Exercises: Supine   Horizontal ABduction Strengthening;Both;5 reps;Theraband    Theraband Level (Shoulder Horizontal ABduction) Level 1 (Yellow)    Horizontal ABduction Limitations Returned therapist demo for each of following    External Rotation Strengthening;Both;10 reps;Theraband    Theraband Level (Shoulder External Rotation) Level 1 (Yellow)    Flexion Strengthening;Both;5 reps;Theraband   Narrow and Wide Grip, 5 times each   Theraband Level (Shoulder Flexion) Level 1 (Yellow)    Flexion Limitations Modified ROM working within pain tolerance    Diagonals Strengthening;Right;Left;5 reps;Theraband    Theraband Level (Shoulder Diagonals) Level 1 (Yellow)        Manual Therapy   Manual Therapy Soft tissue mobilization;Scapular mobilization;Passive ROM    Soft tissue mobilization With coconut oil to Rt pectoralis insertion during P/ROM, also briefly to upper trap; then into Lt S/L for trigger point release to medial Rt scapular border    Scapular Mobilization In Lt S/L to Rt scapula into protraction and retraction, then depression with P/ROM of Rt shoulder    Passive ROM In Supine to Rt shoulder into flexion, abduction and D2 to pts available end ROM; scapular depression thorugout for all motions which allowed less discomfort where she has a "catch" and she was able to achieve full P/ROM today; much improved from last week.                   PT Education - 12/03/19 1129    Education Details Supine scapular series with yellow theraband    Person(s) Educated Patient    Methods Explanation;Demonstration;Handout    Comprehension Verbalized understanding;Returned demonstration            PT Short Term Goals - 09/25/19 1202      PT SHORT TERM GOAL #1   Title Pt will be independent with HEP and Modified MLD for the anterior trunk within 2 weeks in order to demonstrate autonomy of care.    Baseline pt provided with HEP today    Time 2    Period Weeks    Status New    Target Date 10/16/19             PT Long Term Goals - 11/13/19 1116      PT LONG TERM GOAL #1   Title Pt will demonstrate 150 degrees bil shoulder flexion and abduction within 4 weeks to demonstrate return to pre-operative AROM.    Baseline R shoulder flexion: 110, abduction: 69 L shoulder flexion: 118, abduction:  77; approximate AA/ROM with pulleys Rt 120 and abduction 140 degrees, Lt 150 degrees flexion and abduction (forgot to measure but this is visibly much improved since last measured)-11/13/19    Time 4    Period Weeks    Status On-going      PT LONG TERM GOAL #2   Title Pt will report 75% improvement in functional mobility and pain in order to demonstrate an  improvement in subjective quality of life.    Baseline 2/10 pain, decresaed ROM below shoulder height. MOdifications with bathing/dressing; pt reports 50% improvement at this time as she is still limited with vaccuuming, lifting and reaching above head-11/13/19    Time 4    Period Weeks    Status Revised      PT LONG TERM GOAL #3   Title Pt to be independent with HEP for improved A/ROM for bil shoulders and bil UE and postural strength to improve ease  with ADLs.    Baseline No current HEP though began this today as she now has clearance for ROM from surgeon-11/13/19    Time 4    Period Weeks    Status New                 Plan - 12/03/19 1221    Clinical Impression Statement Pt returns not wearing bandage over incision any longer as she was able to stop this at 6 weeks. Also able to proress HEP today to begin strengthening so started with supine scapular series with yellow therband. She tolerated this very well, except had some discomfort with narrow and wide grip flexion so she was instructed to work within pain free range and this allowed for improved tolerance. Continued with manual therapy and much improvement noted with pectoralis tightness today much less limiting of her end ROM. She does report noticing over past few days not slouching over as much with not wearing the bandage anymore. We were able to attain full P/ROM after scapular mobs and other manual therapies today. Pt was very pleased with progress.    Personal Factors and Comorbidities Comorbidity 1    Comorbidities bil masectomy with 3 lymph node removal on the R    Stability/Clinical Decision Making Stable/Uncomplicated    Rehab Potential Good    PT Frequency 2x / week    PT Duration 4 weeks    PT Treatment/Interventions Therapeutic activities;Therapeutic exercise;Neuromuscular re-education;Manual techniques;Cryotherapy;Moist Heat    PT Next Visit Plan Review supine scapular series and cont progressin Rt shoulder  A/AA/P/ROM and Rt pectoralis STM along with Rt scapular mobs prn.    PT Home Exercise Plan Standing dowel exercises, supine scapular series    Consulted and Agree with Plan of Care Patient           Patient will benefit from skilled therapeutic intervention in order to improve the following deficits and impairments:  Decreased knowledge of precautions, Pain, Decreased range of motion, Increased edema  Visit Diagnosis: Malignant neoplasm of upper-outer quadrant of right breast in female, estrogen receptor positive (HCC)  Stiffness of left shoulder, not elsewhere classified  Stiffness of right shoulder, not elsewhere classified  Acute pain of right shoulder  Acute pain of left shoulder  Sternal pain  Abnormal posture     Problem List Patient Active Problem List   Diagnosis Date Noted  . Acquired absence of breast and absent nipple, bilateral 10/07/2019  . Breast wound, right, initial encounter 10/07/2019  . Breast cancer of upper-outer quadrant of right female breast (HCC) 09/05/2019  . Genetic testing 08/12/2019  . Family history of BRCA gene mutation 07/31/2019  . Family history of breast cancer   . Family history of prostate cancer   . Family history of bladder cancer   . Malignant neoplasm of upper-outer quadrant of right breast in female, estrogen receptor positive (HCC) 07/24/2019  . Annual physical exam 03/31/2016  . Occupational bronchitis (HCC) 03/03/2015  . Hyperlipidemia 03/30/2014  . 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 Generalized osteoarthritis 02/23/2012  . Plantar fasciitis, bilateral 02/23/2012  . Vitiligo 02/23/2012  . Difficulty hearing 02/13/2012  . History of lupus 12/21/2011  . Vitamin D deficiency 10/11/2010  . Hypothyroidism 06/21/2010  . Systemic lupus erythematosus (HCC) 06/14/2010    Hermenia Bers, PTA 12/03/2019, 12:27 PM  [facility] [address] [place], [place],  [address] Phone: [phone]   Fax:  [phone]  Name: Stephanie Chase MRN: [id number] Date of Birth:  03/06/1961   

## 2019-12-04 ENCOUNTER — Telehealth: Payer: Self-pay | Admitting: Nurse Practitioner

## 2019-12-04 NOTE — Telephone Encounter (Signed)
Scheduled per 9/7 los. Pt is aware of appts r/s on 9/13.

## 2019-12-05 ENCOUNTER — Ambulatory Visit: Payer: BC Managed Care – PPO

## 2019-12-05 ENCOUNTER — Other Ambulatory Visit: Payer: Self-pay

## 2019-12-05 DIAGNOSIS — Z170 Estrogen receptor positive status [ER+]: Secondary | ICD-10-CM

## 2019-12-05 DIAGNOSIS — M25511 Pain in right shoulder: Secondary | ICD-10-CM

## 2019-12-05 DIAGNOSIS — M25612 Stiffness of left shoulder, not elsewhere classified: Secondary | ICD-10-CM

## 2019-12-05 DIAGNOSIS — C50411 Malignant neoplasm of upper-outer quadrant of right female breast: Secondary | ICD-10-CM | POA: Diagnosis not present

## 2019-12-05 DIAGNOSIS — M25512 Pain in left shoulder: Secondary | ICD-10-CM

## 2019-12-05 DIAGNOSIS — M25611 Stiffness of right shoulder, not elsewhere classified: Secondary | ICD-10-CM

## 2019-12-05 DIAGNOSIS — R293 Abnormal posture: Secondary | ICD-10-CM

## 2019-12-05 DIAGNOSIS — R0789 Other chest pain: Secondary | ICD-10-CM

## 2019-12-05 MED FILL — Dexamethasone Sodium Phosphate Inj 100 MG/10ML: INTRAMUSCULAR | Qty: 2 | Status: AC

## 2019-12-05 NOTE — Progress Notes (Signed)
[place] [facility]   [phone] [phone]) [phone]   Clinic Follow up Note   Patient Care Team: Delsa Bern, MD as PCP - General (Obstetrics and Gynecology) Richrd Prime as Consulting Physician (Obstetrics and Gynecology) Mauro Kaufmann, RN as Oncology Nurse Navigator Rockwell Germany, RN as Oncology Nurse Navigator Stark Klein, MD as Consulting Physician (General Surgery) Truitt Merle, MD as Consulting Physician (Hematology) Kyung Rudd, MD as Consulting Physician (Radiation Oncology) Register, Luetta Nutting, PA-C as Physician Assistant (Dermatology) Dillingham, Loel Lofty, DO as Attending Physician (Plastic Surgery)  Date of Service:  12/08/2019  CHIEF COMPLAINT:  F/u of right breast cancer  SUMMARY OF ONCOLOGIC HISTORY: Oncology History Overview Note  Cancer Staging Malignant neoplasm of upper-outer quadrant of right breast in female, estrogen receptor positive (Stephanie Chase) Staging form: Breast, AJCC 8th Edition - Clinical stage from 07/30/2019: Stage IA (cT1c, cN0, cM0, G3, ER+, PR-, HER2+) - Unsigned    Malignant neoplasm of upper-outer quadrant of right breast in female, estrogen receptor positive (Stephanie Chase)  07/03/2019 Mammogram   Diagnostic Mammogram  IMPRESSION There is a 1.3x1.1cm focal asymmetry in the retroareolar anterior to middle depth right breast 2.2cm from the nipple.  There is a 2 cm focal asymmetry with appearance in the upper outer quadrant posterior depth of right breast 3 cm from nipple -Both suspicious and biopsy recommended.     07/18/2019 Initial Biopsy   Diagnosis 07/18/19 1. Breast, right, needle core biopsy, 12 o'clock - INVASIVE DUCTAL CARCINOMA. 2. Breast, right, needle core biopsy, upper outer quadrant - DUCTAL CARCINOMA IN SITU. Microscopic Comment 1. The invasive carcinoma is nuclear grade 3. The greatest linear extent of tumor in any one core is 11 mm. Ancillary studies will be reported separately. CKAE1AE3, GATA-3, ER and E-cadherin are  positive. PAX 8 is negative. CK7 is noncontributory. 2. The in situ carcinoma is high nuclear grade with central necrosis and calcifications. E-cadherin is positive. P63, Calponin and SMM-1 demonstrate the present of myoepithelium.   07/18/2019 Receptors her2   1. PROGNOSTIC INDICATORS 07/18/19 Results: IMMUNOHISTOCHEMICAL AND MORPHOMETRIC ANALYSIS PERFORMED MANUALLY The tumor cells are POSITIVE for Her2 (3+). Of note, the tumor shows heterogeneity in regards to Her2 expression. Estrogen Receptor: 95%, POSITIVE, STRONG STAINING INTENSITY Progesterone Receptor: 0%, NEGATIVE Proliferation Marker Ki67: 50%   07/24/2019 Initial Diagnosis   Malignant neoplasm of upper-outer quadrant of right breast in female, estrogen receptor positive (Stephanie Chase)   08/01/2019 Breast MRI   IMPRESSION: 1. Known invasive ductal carcinoma measures 1.2 centimeters in the 12 o'clock location. 2. Non mass enhancement measures 2.3 centimeters in the LATERAL portion of the RIGHT breast corresponding to DCIS on biopsy. 3. An additional mass in the UPPER OUTER QUADRANT of the RIGHT breast is 1.5 centimeters and warrants tissue diagnosis. This area correlates with distortion seen mammographically. 4. No axillary adenopathy or ancillary findings. LEFT breast is negative.     08/04/2019 Echocardiogram   IMPRESSIONS     1. Left ventricular ejection fraction, by estimation, is 65 to 70%. The  left ventricle has normal function. The left ventricle has no regional  wall motion abnormalities. Left ventricular diastolic parameters were  normal. The average left ventricular  global longitudinal strain is -20.4 %.   2. Right ventricular systolic function is normal. The right ventricular  size is normal.   3. The mitral valve is normal in structure. No evidence of mitral valve  regurgitation. No evidence of mitral stenosis.   4. The aortic valve is normal in structure. Aortic valve regurgitation  is  not visualized. No aortic  stenosis is present.    08/10/2019 Genetic Testing   Negative genetic testing:  No pathogenic variants detected on the [facility] Breast Cancer STAT panel + Common Hereditary Cancers panel. A variant of uncertain significance (VUS) was detected in the ATM gene called c.3834C>A. The report date is 08/10/2019.  The Breast Cancer STAT Panel offered by [facility] includes sequencing and deletion/duplication analysis for the following 9 genes:  ATM, BRCA1, BRCA2, CDH1, CHEK2, PALB2, PTEN, STK11 and TP53. The Common Hereditary Cancers Panel offered by [facility] includes sequencing and/or deletion duplication testing of the following 48 genes: APC, ATM, AXIN2, BARD1, BMPR1A, BRCA1, BRCA2, BRIP1, CDH1, CDK4, CDKN2A (p14ARF), CDKN2A (p16INK4a), CHEK2, CTNNA1, DICER1, EPCAM (Deletion/duplication testing only), GREM1 (promoter region deletion/duplication testing only), KIT, MEN1, MLH1, MSH2, MSH3, MSH6, MUTYH, NBN, NF1, NHTL1, PALB2, PDGFRA, PMS2, POLD1, POLE, PTEN, RAD50, RAD51C, RAD51D, RNF43, SDHB, SDHC, SDHD, SMAD4, SMARCA4. STK11, TP53, TSC1, TSC2, and VHL.  The following genes were evaluated for sequence changes only: SDHA and HOXB13 c.251G>A variant only.    09/05/2019 Surgery   BILATERAL MASTECTOMY WITH RIGHT SENTINEL LYMPH NODE BIOPSY and PAC placement by Dr Barry Dienes    09/05/2019 Pathology Results   FINAL MICROSCOPIC DIAGNOSIS:   A. BREAST, LEFT, MASTECTOMY:  - Benign breast tissue.   B. BREAST, RIGHT, MASTECTOMY:  - Invasive ductal carcinoma, multifocal, 1.4 cm in greatest dimension,  Nottingham grade 3 of 3.  - Ductal carcinoma in situ, high nuclear grade with central necrosis and  calcifications.  - Margins of resection are not involved.  - Biopsy sites.  - See oncology table.   C. SENTINEL LYMPH NODE, RIGHT AXILLARY #1, BIOPSY:  - One lymph node, negative for carcinoma (0/1).   D. SENTINEL LYMPH NODE, RIGHT AXILLARY #2, BIOPSY:  - One lymph node, negative for carcinoma (0/1).   E. SENTINEL LYMPH  NODE, RIGHT AXILLARY #3, BIOPSY:  - One lymph node, negative for carcinoma (0/1).     Estrogen Receptor: 95%, positive, strong             Progesterone Receptor: 0%, negative             HER2: Positive (3+)             Ki-67: 50%    09/05/2019 Cancer Staging   Staging form: Breast, AJCC 8th Edition - Pathologic stage from 09/05/2019: Stage IA (pT1c, pN0, cM0, G3, ER+, PR-, HER2+) - Signed by Gardenia Phlegm, NP on 09/17/2019   10/22/2019 Surgery   Excision of right breast wound and APPLICATION OF A-CELL, INTEGRA BWM OR CELLERATE by Dr Marla Roe on 10/22/19   10/27/2019 -  Chemotherapy   Weekly Taxol and Herceptin for 12 weeks starting 10/27/19 then maintenance Herceptin q3weeks to complete 1 year treatment. Given recent right breast infection and debridement, will start Herceptin on 10/27/19 with loading dose (q2weeks) and add Taxol with week 3 on 11/10/19 then continuing with both weekly.       CURRENT THERAPY:  Weekly Taxol and Herceptinfor12 weeksstarting 8/2/21then maintenance Herceptinq3weeksto complete 1 yeartreatment.  INTERVAL HISTORY:  Arionne Iams is here for a follow up and treatment. She presents to the clinic alone. She notes she plans to see her mother for her surgery in [place] tomorrow through Sunday. She notes she has acid recently. She notes she has fluid in her ears. She notes she is still smoking 5-15 cigarettes a day. She noes she is stressed with her and her mother's health and both her  daughter will have moved to [place].     REVIEW OF SYSTEMS:   Constitutional: Denies fevers, chills or abnormal weight loss Eyes: Denies blurriness of vision Ears, nose, mouth, throat, and face: Denies mucositis or sore throat (+) Fluids in ears  Respiratory: Denies cough, dyspnea or wheezes Cardiovascular: Denies palpitation, chest discomfort or lower extremity swelling Gastrointestinal:  Denies nausea or change in bowel habits (+) Acid Reflux  Skin: Denies abnormal  skin rashes Lymphatics: Denies new lymphadenopathy or easy bruising Neurological:Denies numbness, tingling or new weaknesses Behavioral/Psych: Mood is stable, no new changes  All other systems were reviewed with the patient and are negative.  MEDICAL HISTORY:  Past Medical History:  Diagnosis Date  . Anemia   . Anxiety   . Asthma   . Breast cancer ([place])   . COPD (chronic obstructive pulmonary disease) ([place])   . Discoid lupus   . Family history of bladder cancer   . Family history of BRCA gene mutation   . Family history of breast cancer   . Family history of prostate cancer   . GERD (gastroesophageal reflux disease)   . Hypothyroidism   . Pneumonia   . Thyroid disease     SURGICAL HISTORY: Past Surgical History:  Procedure Laterality Date  . ABLATION ON ENDOMETRIOSIS    . APPLICATION OF A-CELL OF EXTREMITY Right 10/22/2019   Procedure: EXCISION OF RIGHT BREAST WOUND WITH PRIMARY CLOSURE;  Surgeon: [name] Going, DO;  Location: [facility];  Service: Plastics;  Laterality: Right;  . BREAST LUMPECTOMY    . DEBRIDEMENT AND CLOSURE WOUND Right 10/22/2019   Procedure: Excision of right breast wound;  Surgeon: [name] Going, DO;  Location: [facility];  Service: Plastics;  Laterality: Right;  . MASTECTOMY W/ SENTINEL NODE BIOPSY Bilateral 09/05/2019   Procedure: BILATERAL MASTECTOMY WITH RIGHT SENTINEL LYMPH NODE BIOPSY;  Surgeon: Stark Klein, MD;  Location: [facility];  Service: General;  Laterality: Bilateral;  COMBINED WITH REGIONAL FOR POST OP PAIN  . PORTACATH PLACEMENT Left 09/05/2019   Procedure: INSERTION PORT-A-CATH WITH ULTRASOUND GUIDANCE;  Surgeon: Stark Klein, MD;  Location: [facility];  Service: General;  Laterality: Left;  . TONSILLECTOMY    . TUBAL LIGATION    . WISDOM TOOTH EXTRACTION      I have reviewed the social history and family history with the patient and they are unchanged from previous note.  ALLERGIES:  is allergic to bactrim [sulfamethoxazole-trimethoprim].   MEDICATIONS:  Current Outpatient Medications  Medication Sig Dispense Refill  . acetaminophen (TYLENOL) 500 MG tablet Take 500-1,000 mg by mouth every 6 (six) hours as needed for moderate pain.     . Albuterol Sulfate (PROAIR RESPICLICK) 329 (90 Base) MCG/ACT AEPB Inhale 2 puffs into the lungs every 6 (six) hours as needed. (Patient taking differently: Inhale 2 puffs into the lungs every 6 (six) hours as needed (SOB). ) 1 each 3  . ALPRAZolam (XANAX) 0.25 MG tablet Take 1 tablet (0.25 mg total) by mouth daily as needed for anxiety. 30 tablet 0  . Ascorbic Acid (VITAMIN C) 1000 MG tablet Take 1,000 mg by mouth daily.    . cetirizine (ZYRTEC) 10 MG tablet Take 10 mg by mouth daily as needed for allergies.     . Cholecalciferol (VITAMIN D) 50 MCG (2000 UT) tablet Take 2,000 Units by mouth daily.    Marland Kitchen dextromethorphan (DELSYM) 30 MG/5ML liquid Take 60 mg by mouth 2 (two) times daily as needed for cough.     Marland Kitchen  fluticasone (FLONASE) 50 MCG/ACT nasal spray Place 1 spray into both nostrils daily as needed for allergies.     . fluticasone (FLONASE) 50 MCG/ACT nasal spray Place 2 sprays into both nostrils in the morning and at bedtime. 16 g 5  . levothyroxine (SYNTHROID) 75 MCG tablet Take 75 mcg by mouth daily before breakfast.    . lidocaine-prilocaine (EMLA) cream Apply 1 application topically as needed. (Patient taking differently: Apply 1 application topically daily as needed (port access). ) 30 g 1  . liothyronine (CYTOMEL) 5 MCG tablet TAKE 2 TABLETS (=10MCG     TOTAL)     DAILY (Patient taking differently: Take 10 mcg by mouth daily. ) 180 tablet 1  . methocarbamol (ROBAXIN) 500 MG tablet Take 1 tablet (500 mg total) by mouth every 6 (six) hours as needed (use for muscle cramps/pain). 20 tablet 1  . Multiple Vitamin (MULTIVITAMIN WITH MINERALS) TABS tablet Take 1 tablet by mouth 2 (two) times daily.     . nicotine (NICODERM CQ - DOSED IN MG/24 HOURS) 21 mg/24hr patch Place 21 mg onto the skin  daily.    Marland Kitchen nystatin (MYCOSTATIN) 100000 UNIT/ML suspension Take 5 mLs (500,000 Units total) by mouth 4 (four) times daily. 473 mL 1  . ondansetron (ZOFRAN) 8 MG tablet Take 1 tablet (8 mg total) by mouth 2 (two) times daily as needed (Nausea or vomiting). 30 tablet 1  . OVER THE COUNTER MEDICATION Take 1 each by mouth 2 (two) times daily as needed (anxiety / sleep). CBD Gummies    . oxyCODONE (OXY IR/ROXICODONE) 5 MG immediate release tablet Take 1 tablet (5 mg total) by mouth every 6 (six) hours as needed for breakthrough pain. 20 tablet 0  . pantoprazole (PROTONIX) 40 MG tablet Take 1 tablet (40 mg total) by mouth daily. 30 tablet 2  . prochlorperazine (COMPAZINE) 10 MG tablet Take 1 tablet (10 mg total) by mouth every 6 (six) hours as needed (Nausea or vomiting). 30 tablet 1  . Zinc Sulfate 220 (50 Zn) MG TABS TAKE 1 TABLET (220 MG TOTAL) BY MOUTH IN THE MORNING AND AT BEDTIME. 60 tablet 0   No current facility-administered medications for this visit.   Facility-Administered Medications Ordered in Other Visits  Medication Dose Route Frequency Provider Last Rate Last Admin  . alum & mag hydroxide-simeth (MAALOX/MYLANTA) 200-200-20 MG/5ML suspension 30 mL  30 mL Oral Once Harle Stanford., PA-C      . heparin lock flush 100 unit/mL  500 Units Intracatheter Once PRN Truitt Merle, MD      . PACLitaxel (TAXOL) 144 mg in sodium chloride 0.9 % 250 mL chemo infusion (</= 108m/m2)  80 mg/m2 (Treatment Plan Recorded) Intravenous Once FTruitt Merle MD      . sodium chloride flush (NS) 0.9 % injection 10 mL  10 mL Intracatheter PRN FTruitt Merle MD      . trastuzumab-qyyp (TRAZIMERA) 150 mg in sodium chloride 0.9 % 250 mL chemo infusion  150 mg Intravenous Once FTruitt Merle MD 514.3 mL/hr at 12/08/19 1507 150 mg at 12/08/19 1507    PHYSICAL EXAMINATION: ECOG PERFORMANCE STATUS: 1 - Symptomatic but completely ambulatory  Vitals:   12/08/19 1226  BP: (!) 126/91  Pulse: 86  Resp: 17  Temp: 98.1 F (36.7 C)   SpO2: 99%   Filed Weights   12/08/19 1226  Weight: 164 lb (74.4 kg)    GENERAL:alert, no distress and comfortable SKIN: skin color, texture, turgor are normal, no rashes or  significant lesions EYES: normal, Conjunctiva are pink and non-injected, sclera clear EARS: No discharge (+) old blood in left ear, ear drum clear but slightly bulging  OROPHARYNX:no exudate, no erythema and lips, buccal mucosa (+) Mild thrush of tongue  NECK: supple, thyroid normal size, non-tender, without nodularity LYMPH:  no palpable lymphadenopathy in the cervical, axillary LUNGS: clear to auscultation and percussion with normal breathing effort HEART: regular rate & rhythm and no murmurs and no lower extremity edema ABDOMEN:abdomen soft, non-tender and normal bowel sounds Musculoskeletal:no cyanosis of digits and no clubbing  NEURO: alert & oriented x 3 with fluent speech, no focal motor/sensory deficits BREAST: S/p b/l mastectomy with very well healed incisions, no axillary adenopathy, no nodule on chest wall   LABORATORY DATA:  I have reviewed the data as listed CBC Latest Ref Rng & Units 12/08/2019 12/02/2019 11/26/2019  WBC 4.0 - 10.5 K/uL 9.5 7.5 6.3  Hemoglobin 12.0 - 15.0 g/dL 13.3 12.8 12.2  Hematocrit 36 - 46 % 39.6 38.7 36.5  Platelets 150 - 400 K/uL 312 323 290     CMP Latest Ref Rng & Units 12/08/2019 12/02/2019 11/26/2019  Glucose 70 - 99 mg/dL 79 86 86  BUN 6 - 20 mg/dL '13 6 9  ' Creatinine 0.44 - 1.00 mg/dL 0.64 0.67 0.63  Sodium 135 - 145 mmol/L 135 137 138  Potassium 3.5 - 5.1 mmol/L 4.2 4.1 4.0  Chloride 98 - 111 mmol/L 102 106 105  CO2 22 - 32 mmol/L '25 24 26  ' Calcium 8.9 - 10.3 mg/dL 10.2 9.9 10.4(H)  Total Protein 6.5 - 8.1 g/dL 7.7 7.3 7.0  Total Bilirubin 0.3 - 1.2 mg/dL 0.3 0.3 <0.2(L)  Alkaline Phos 38 - 126 U/L 132(H) 121 116  AST 15 - 41 U/L 54(H) 46(H) 32  ALT 0 - 44 U/L 84(H) 57(H) 62(H)      RADIOGRAPHIC STUDIES: I have personally reviewed the radiological images as  listed and agreed with the findings in the report. No results found.   ASSESSMENT & PLAN:  Stephanie Chase is a 59 y.o. female with    1.Malignant neoplasm of upper-outer quadrant of right breast,invasive ductal carcinoma and DCIS,StageIA, pT1cN0M0,ER+/HER2+, PR-,Grade III -She was diagnosed in 06/2019.Given multifocal disease, she underwent right mastectomy and SLNB by Dr Barry Dienes on 09/05/19.Path showedmultifocal disease in right breast, largest 1.4cm, which were found to be high grade invasive ductal carcinoma with components of DCIS. -Given her HER2 positive high grade disease, I started her on adjuvant chemo with weekly paclitaxel and trastuzumabfor 12 weeks starting 10/27/19, followed bymaintenancetrastuzumabq3weeks to complete 1 year treatment. The goal of chemo is curative.  -Due to tinnitus, her Kanjinti (Herceptin) was changed to weekly Trastuzumab (Herceptin) on 11/24/19.  -She is tolerating treatment moderately well. Labs reviewed and adequate to proceed with week 5 Paclitaxel and Trastuzumab today.  -F/u in 2 weeks   2. Genetic Testingnegative for pathogenetic mutations -Testing results did show A variant of uncertain significance (VUS) was detected in the ATM gene called c.3834C>A. The report date is 08/10/2019.She can repeat testing in 5-10 years.  3. Smoking Cessation, COPD -She has been smoking since the 2nd grade. She has been able to cut downto 5-15 cigarettes a day. I discussed smoking riskand smoking cessation with her. She knows to stop smoking before reconstruction surgery.  -Per pt at some point she may was diagnosed with COPD or Emphysema. She is on albuterol inhaler and Singulair.  4. Comorbidities:DiscoidLupus, Vitiligo, Thyroid disease.  -She was diagnosed with Lupus  10 years ago. She uses steroid cream as needed for her lupus rashes, some skin lesions of which have been removed. -She sees her dermatologist Dr Nevada Crane and their [place].   -She is on 2 thyroid medications.  5. Social Support, Anxiety  -She lives alone but has good support from her 2 daughters but they live out of town.  -Her daughter notes the patient has anxiety or panic attacks when she gets overwhelmed or disappointed. -Gon low doseXanaxPRN.She can take Xanax before infusions. I noted to watch for drowsiness. -Ipreviouslyoffered her the chance to speak with out SW, she declined.  6. Mild Transminitis  -Her 11/14/19 labs, she had CA 10.8 and mild Transaminitis. I discussed this could be from chemo. -I recommend she avoid alcohol while on chemo and not use too much Tylenol.  -Stable. Continue monitoring   7. Tinnitus -Onset after 1-2 cycles of Kanjinti and was switched to Trastuzumab. She continues to have pain in mouth along with sensation of fluid behind right ear.  -My ear exam today was inconclusive (12/08/19). She is scheduled to see ENT Dr Lucia Gaskins next week.    PLAN: -I called in Protonix and Nystatin mouthwash today  -Labs reviewed and adequate to proceed with Taxol and trastuzumab today  -Lab, flush, and Taxol andtrastuzumab weekly  -F/u in 2, 4, 6, 8 weeks -ENT consult next week    No problem-specific Assessment & Plan notes found for this encounter.   No orders of the defined types were placed in this encounter.  All questions were answered. The patient knows to call the clinic with any problems, questions or concerns. No barriers to learning was detected. The total time spent in the appointment was 30 minutes.     Truitt Merle, MD 12/08/2019   I, Joslyn Devon, am acting as scribe for Truitt Merle, MD.   I have reviewed the above documentation for accuracy and completeness, and I agree with the above.

## 2019-12-05 NOTE — Therapy (Signed)
[place], [place], [address] Phone: [phone]   Fax:  [phone]  Physical Therapy Treatment  Patient Details  Name: Stephanie Chase MRN: [id number] Date of Birth: [birth year] Referring Provider (PT): Dr. Stark Klein   Encounter Date: 12/05/2019   PT End of Session - 12/05/19 1202    Visit Number 10    Number of Visits 18    Date for PT Re-Evaluation 12/11/19    PT Start Time 1107    PT Stop Time 1212    PT Time Calculation (min) 65 min    Activity Tolerance Patient tolerated treatment well    Behavior During Therapy [facility] for tasks assessed/performed           Past Medical History:  Diagnosis Date  . Anemia   . Anxiety   . Asthma   . Breast cancer ([place])   . COPD (chronic obstructive pulmonary disease) ([place])   . Discoid lupus   . Family history of bladder cancer   . Family history of BRCA gene mutation   . Family history of breast cancer   . Family history of prostate cancer   . GERD (gastroesophageal reflux disease)   . Hypothyroidism   . Pneumonia   . Thyroid disease     Past Surgical History:  Procedure Laterality Date  . ABLATION ON ENDOMETRIOSIS    . APPLICATION OF A-CELL OF EXTREMITY Right 10/22/2019   Procedure: EXCISION OF RIGHT BREAST WOUND WITH PRIMARY CLOSURE;  Surgeon: [name] Going, DO;  Location: [facility];  Service: Plastics;  Laterality: Right;  . BREAST LUMPECTOMY    . DEBRIDEMENT AND CLOSURE WOUND Right 10/22/2019   Procedure: Excision of right breast wound;  Surgeon: [name] Going, DO;  Location: [facility];  Service: Plastics;  Laterality: Right;  . MASTECTOMY W/ SENTINEL NODE BIOPSY Bilateral 09/05/2019   Procedure: BILATERAL MASTECTOMY WITH RIGHT SENTINEL LYMPH NODE BIOPSY;  Surgeon: Stark Klein, MD;  Location: [facility];  Service: General;  Laterality: Bilateral;  COMBINED WITH REGIONAL FOR POST OP PAIN  . PORTACATH PLACEMENT Left 09/05/2019   Procedure:  INSERTION PORT-A-CATH WITH ULTRASOUND GUIDANCE;  Surgeon: Stark Klein, MD;  Location: [facility];  Service: General;  Laterality: Left;  . TONSILLECTOMY    . TUBAL LIGATION    . WISDOM TOOTH EXTRACTION      There were no vitals filed for this visit.   Subjective Assessment - 12/05/19 1115    Subjective I only did the new HEP 2x since I was here last and I think it's increasing my tightness at my Rt chest/shoulder some so I'm just going to space that out more.    Pertinent History Bil masectomy 09/05/19 with 3 lymph nodes removed and negative on the R and benign tisuse on the L. Patient was diagnosed on 06/09/2019 with right grade III invasive ductal carcinoma breast cancer. It mesures 1.3 cm in the upper outer quadrant with 1.6 cm of calcifications and DCIS. It is ER positive, PR negative, and HER2 positive with a Ki67 of 50%. She smokes about 1 pack/day.    Patient Stated Goals I want to get back to kayaking.    Currently in Pain? No/denies                             [facility] Adult PT Treatment/Exercise - 12/05/19 0001      Shoulder Exercises: Pulleys   Flexion 2 minutes  ABduction 2 minutes    ABduction Limitations Pt with good technique for both today      Shoulder Exercises: Therapy Ball   Flexion Both;10 reps   forward lean into end of stretch   ABduction Right;Left;5 reps   same side lean into end of stretch     Shoulder Exercises: Stretch   Corner Stretch 5 reps;10 seconds   done in Administrator, arts Limitations Educated pt to add this throughout her day      Manual Therapy   Soft tissue mobilization With coconut oil to Rt pectoralis insertion during P/ROM, also briefly to upper trap; then into Lt S/L for trigger point release to medial Rt scapular border    Scapular Mobilization In Lt S/L to Rt scapula into protraction and retraction, then depression with P/ROM of Rt shoulder    Passive ROM In Supine to Rt shoulder into flexion, abduction and D2 to pts  available end ROM; scapular depression thorugout for all motions which allowed less discomfort where she has a "catch" and she was able to achieve full P/ROM today; much improved from last week.                   PT Education - 12/05/19 1248    Education Details Reminded her okay to only do new supine scapular series every 2-3 days and progress this as tightness allows; also added doorway pectoralis stretch to HEP verbally and after pt returned demo    Person(s) Educated Patient    Methods Explanation;Demonstration    Comprehension Verbalized understanding;Returned demonstration            PT Short Term Goals - 09/25/19 1202      PT SHORT TERM GOAL #1   Title Pt will be independent with HEP and Modified MLD for the anterior trunk within 2 weeks in order to demonstrate autonomy of care.    Baseline pt provided with HEP today    Time 2    Period Weeks    Status New    Target Date 10/16/19             PT Long Term Goals - 11/13/19 1116      PT LONG TERM GOAL #1   Title Pt will demonstrate 150 degrees bil shoulder flexion and abduction within 4 weeks to demonstrate return to pre-operative AROM.    Baseline R shoulder flexion: 110, abduction: 69 L shoulder flexion: 118, abduction:  77; approximate AA/ROM with pulleys Rt 120 and abduction 140 degrees, Lt 150 degrees flexion and abduction (forgot to measure but this is visibly much improved since last measured)-11/13/19    Time 4    Period Weeks    Status On-going      PT LONG TERM GOAL #2   Title Pt will report 75% improvement in functional mobility and pain in order to demonstrate an improvement in subjective quality of life.    Baseline 2/10 pain, decresaed ROM below shoulder height. MOdifications with bathing/dressing; pt reports 50% improvement at this time as she is still limited with vaccuuming, lifting and reaching above head-11/13/19    Time 4    Period Weeks    Status Revised      PT LONG TERM GOAL #3   Title Pt  to be independent with HEP for improved A/ROM for bil shoulders and bil UE and postural strength to improve ease with ADLs.    Baseline No current HEP though began this today as she now  has clearance for ROM from surgeon-11/13/19    Time 4    Period Weeks    Status New                 Plan - 12/05/19 1208    Clinical Impression Statement Pt reported some increased tightness at start of session but reports this drastically improved by end of session after manual therapy. She felt this may have been due to new theraband exercises. Reminded her okay to space doing these out to every 2-3 days for now, also to cont focusing on end range stretching. Added doorway pectoralis stretch and briefly reviewed technique of each supine scapular motion without resistance, pt was performing these correctly. Her end AA/ROM improved as well with stretching at end of session. Discussed with pt being near end of POC next week and whether she feels ready for D/C or renwal and she would like to cont for now but decr freq to 1x/wk. This is appropriate as we have only, as of this week, been able to progress to strengthening due to surgical restrictions. Her end Rt>Lt shoulder motion, though improved, is also still limited by muscular and fascial tightness.    Personal Factors and Comorbidities Comorbidity 1    Comorbidities bil masectomy with 3 lymph node removal on the R    Rehab Potential Good    PT Frequency 2x / week    PT Duration 4 weeks    PT Treatment/Interventions Therapeutic activities;Therapeutic exercise;Neuromuscular re-education;Manual techniques;Cryotherapy;Moist Heat    PT Next Visit Plan Renewal next week decreasing freq to 1x/wk; Review supine scapular series prn and cont progressin Rt shoulder A/AA/P/ROM and Rt pectoralis STM along with Rt scapular mobs prn.    PT Home Exercise Plan Standing dowel exercises, supine scapular series    Consulted and Agree with Plan of Care Patient            Patient will benefit from skilled therapeutic intervention in order to improve the following deficits and impairments:  Decreased knowledge of precautions, Pain, Decreased range of motion, Increased edema  Visit Diagnosis: Malignant neoplasm of upper-outer quadrant of right breast in female, estrogen receptor positive (HCC)  Stiffness of left shoulder, not elsewhere classified  Stiffness of right shoulder, not elsewhere classified  Acute pain of right shoulder  Acute pain of left shoulder  Sternal pain  Abnormal posture     Problem List Patient Active Problem List   Diagnosis Date Noted  . Acquired absence of breast and absent nipple, bilateral 10/07/2019  . Breast wound, right, initial encounter 10/07/2019  . Breast cancer of upper-outer quadrant of right female breast ([place]) 09/05/2019  . Genetic testing 08/12/2019  . Family history of BRCA gene mutation 07/31/2019  . Family history of breast cancer   . Family history of prostate cancer   . Family history of bladder cancer   . Malignant neoplasm of upper-outer quadrant of right breast in female, estrogen receptor positive ([facility]) 07/24/2019  . Annual physical exam 03/31/2016  . Occupational bronchitis ([place]) 03/03/2015  . Hyperlipidemia 03/30/2014  . 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 Generalized osteoarthritis 02/23/2012  . Plantar fasciitis, bilateral 02/23/2012  . Vitiligo 02/23/2012  . Difficulty hearing 02/13/2012  . History of lupus 12/21/2011  . Vitamin D deficiency 10/11/2010  . Hypothyroidism 06/21/2010  . Systemic lupus erythematosus ([place]) 06/14/2010    Otelia Limes, PTA 12/05/2019, 12:49 PM  [place], [place], [address] Phone: [phone]  Fax:  [phone]  Name: Stephanie Chase MRN: [id number] Date of Birth: 08/19/1960

## 2019-12-08 ENCOUNTER — Inpatient Hospital Stay: Payer: BC Managed Care – PPO

## 2019-12-08 ENCOUNTER — Other Ambulatory Visit: Payer: BC Managed Care – PPO

## 2019-12-08 ENCOUNTER — Inpatient Hospital Stay (HOSPITAL_BASED_OUTPATIENT_CLINIC_OR_DEPARTMENT_OTHER): Payer: BC Managed Care – PPO | Admitting: Hematology

## 2019-12-08 ENCOUNTER — Ambulatory Visit: Payer: BC Managed Care – PPO | Admitting: Hematology

## 2019-12-08 ENCOUNTER — Encounter: Payer: Self-pay | Admitting: Hematology

## 2019-12-08 ENCOUNTER — Ambulatory Visit: Payer: BC Managed Care – PPO

## 2019-12-08 ENCOUNTER — Other Ambulatory Visit: Payer: Self-pay

## 2019-12-08 VITALS — BP 126/91 | HR 86 | Temp 98.1°F | Resp 17 | Ht 65.0 in | Wt 164.0 lb

## 2019-12-08 DIAGNOSIS — Z170 Estrogen receptor positive status [ER+]: Secondary | ICD-10-CM

## 2019-12-08 DIAGNOSIS — C50411 Malignant neoplasm of upper-outer quadrant of right female breast: Secondary | ICD-10-CM

## 2019-12-08 DIAGNOSIS — E039 Hypothyroidism, unspecified: Secondary | ICD-10-CM

## 2019-12-08 DIAGNOSIS — Z5112 Encounter for antineoplastic immunotherapy: Secondary | ICD-10-CM | POA: Diagnosis not present

## 2019-12-08 LAB — CBC WITH DIFFERENTIAL (CANCER CENTER ONLY)
Abs Immature Granulocytes: 0.07 10*3/uL (ref 0.00–0.07)
Basophils Absolute: 0.1 10*3/uL (ref 0.0–0.1)
Basophils Relative: 1 %
Eosinophils Absolute: 0.1 10*3/uL (ref 0.0–0.5)
Eosinophils Relative: 1 %
HCT: 39.6 % (ref 36.0–46.0)
Hemoglobin: 13.3 g/dL (ref 12.0–15.0)
Immature Granulocytes: 1 %
Lymphocytes Relative: 28 %
Lymphs Abs: 2.7 10*3/uL (ref 0.7–4.0)
MCH: 28.9 pg (ref 26.0–34.0)
MCHC: 33.6 g/dL (ref 30.0–36.0)
MCV: 85.9 fL (ref 80.0–100.0)
Monocytes Absolute: 0.5 10*3/uL (ref 0.1–1.0)
Monocytes Relative: 5 %
Neutro Abs: 6.1 10*3/uL (ref 1.7–7.7)
Neutrophils Relative %: 64 %
Platelet Count: 312 10*3/uL (ref 150–400)
RBC: 4.61 MIL/uL (ref 3.87–5.11)
RDW: 13.4 % (ref 11.5–15.5)
WBC Count: 9.5 10*3/uL (ref 4.0–10.5)
nRBC: 0 % (ref 0.0–0.2)

## 2019-12-08 LAB — CMP (CANCER CENTER ONLY)
ALT: 84 U/L — ABNORMAL HIGH (ref 0–44)
AST: 54 U/L — ABNORMAL HIGH (ref 15–41)
Albumin: 4.1 g/dL (ref 3.5–5.0)
Alkaline Phosphatase: 132 U/L — ABNORMAL HIGH (ref 38–126)
Anion gap: 8 (ref 5–15)
BUN: 13 mg/dL (ref 6–20)
CO2: 25 mmol/L (ref 22–32)
Calcium: 10.2 mg/dL (ref 8.9–10.3)
Chloride: 102 mmol/L (ref 98–111)
Creatinine: 0.64 mg/dL (ref 0.44–1.00)
GFR, Est AFR Am: >60 mL/min (ref >60)
GFR, Estimated: >60 mL/min (ref >60)
Glucose, Bld: 79 mg/dL (ref 70–99)
Potassium: 4.2 mmol/L (ref 3.5–5.1)
Sodium: 135 mmol/L (ref 135–145)
Total Bilirubin: 0.3 mg/dL (ref 0.3–1.2)
Total Protein: 7.7 g/dL (ref 6.5–8.1)

## 2019-12-08 LAB — TSH: TSH: 1.487 u[IU]/mL (ref 0.308–3.960)

## 2019-12-08 MED ORDER — HEPARIN SOD (PORK) LOCK FLUSH 100 UNIT/ML IV SOLN
500.0000 [IU] | Freq: Once | INTRAVENOUS | Status: AC | PRN
  Administered 2019-12-08: 500 [IU]
  Filled 2019-12-08: qty 5

## 2019-12-08 MED ORDER — NYSTATIN 100000 UNIT/ML MT SUSP: 5.0000 mL | Freq: Four times a day (QID) | OROMUCOSAL | 1 refills | Status: DC

## 2019-12-08 MED ORDER — ACETAMINOPHEN 325 MG PO TABS
650.0000 mg | ORAL_TABLET | Freq: Once | ORAL | Status: AC
  Administered 2019-12-08: 650 mg via ORAL

## 2019-12-08 MED ORDER — SODIUM CHLORIDE 0.9% FLUSH
10.0000 mL | INTRAVENOUS | Status: DC | PRN
  Administered 2019-12-08: 10 mL
  Filled 2019-12-08: qty 10

## 2019-12-08 MED ORDER — DIPHENHYDRAMINE HCL 50 MG/ML IJ SOLN
25.0000 mg | Freq: Once | INTRAMUSCULAR | Status: AC
  Administered 2019-12-08: 25 mg via INTRAVENOUS

## 2019-12-08 MED ORDER — PANTOPRAZOLE SODIUM 40 MG PO TBEC: 40.0000 mg | DELAYED_RELEASE_TABLET | Freq: Every day | ORAL | 2 refills | Status: DC

## 2019-12-08 MED ORDER — SODIUM CHLORIDE 0.9 % IV SOLN
Freq: Once | INTRAVENOUS | Status: AC
  Filled 2019-12-08: qty 250

## 2019-12-08 MED ORDER — SODIUM CHLORIDE 0.9 % IV SOLN
10.0000 mg | Freq: Once | INTRAVENOUS | Status: AC
  Administered 2019-12-08: 10 mg via INTRAVENOUS
  Filled 2019-12-08: qty 10

## 2019-12-08 MED ORDER — SODIUM CHLORIDE 0.9 % IV SOLN
40.0000 mg | Freq: Once | INTRAVENOUS | Status: AC
  Administered 2019-12-08: 40 mg via INTRAVENOUS
  Filled 2019-12-08: qty 4

## 2019-12-08 MED ORDER — SODIUM CHLORIDE 0.9 % IV SOLN
80.0000 mg/m2 | Freq: Once | INTRAVENOUS | Status: AC
  Administered 2019-12-08: 144 mg via INTRAVENOUS
  Filled 2019-12-08: qty 24

## 2019-12-08 MED ORDER — TRASTUZUMAB-QYYP CHEMO 420 MG IV SOLR
150.0000 mg | Freq: Once | INTRAVENOUS | Status: AC
  Administered 2019-12-08: 150 mg via INTRAVENOUS
  Filled 2019-12-08: qty 7.14

## 2019-12-08 NOTE — Patient Instructions (Signed)
[facility] Discharge Instructions for Patients Receiving Chemotherapy  Today you received the following chemotherapy agents trastuzumab, paclitaxel  To help prevent nausea and vomiting after your treatment, we encourage you to take your nausea medication as directed.   If you develop nausea and vomiting that is not controlled by your nausea medication, call the clinic.   BELOW ARE SYMPTOMS THAT SHOULD BE REPORTED IMMEDIATELY:  *FEVER GREATER THAN 100.5 F  *CHILLS WITH OR WITHOUT FEVER  NAUSEA AND VOMITING THAT IS NOT CONTROLLED WITH YOUR NAUSEA MEDICATION  *UNUSUAL SHORTNESS OF BREATH  *UNUSUAL BRUISING OR BLEEDING  TENDERNESS IN MOUTH AND THROAT WITH OR WITHOUT PRESENCE OF ULCERS  *URINARY PROBLEMS  *BOWEL PROBLEMS  UNUSUAL RASH Items with * indicate a potential emergency and should be followed up as soon as possible.  Feel free to call the clinic should you have any questions or concerns. The clinic phone number is [phone].  Please show the CHEMO ALERT CARD at check-in to the Emergency Department and triage nurse.   

## 2019-12-08 NOTE — Patient Instructions (Signed)

## 2019-12-09 ENCOUNTER — Telehealth: Payer: Self-pay | Admitting: Hematology

## 2019-12-09 ENCOUNTER — Ambulatory Visit: Payer: BC Managed Care – PPO

## 2019-12-09 NOTE — Telephone Encounter (Signed)
Scheduled per 9/13 los. Noted to give pt appt calendar on next visit.

## 2019-12-12 NOTE — Progress Notes (Signed)
Stephanie Chase   Telephone:([phone] Fax:([phone]) [phone]   Clinic Follow up Note   Patient Care Team: Delsa Bern, MD as PCP - General (Obstetrics and Gynecology) Richrd Prime as Consulting Physician (Obstetrics and Gynecology) Mauro Kaufmann, RN as Oncology Nurse Navigator Rockwell Germany, RN as Oncology Nurse Navigator Stark Klein, MD as Consulting Physician (General Surgery) Truitt Merle, MD as Consulting Physician (Hematology) Kyung Rudd, MD as Consulting Physician (Radiation Oncology) Register, Luetta Nutting, PA-C as Physician Assistant (Dermatology) [name] Going, DO as Attending Physician (Plastic Surgery)  Date of Service:  12/15/2019  CHIEF COMPLAINT: F/u of right breast cancer  SUMMARY OF ONCOLOGIC HISTORY: Oncology History Overview Note  Cancer Staging Malignant neoplasm of upper-outer quadrant of right breast in female, estrogen receptor positive ([place]) Staging form: Breast, AJCC 8th Edition - Clinical stage from 07/30/2019: Stage IA (cT1c, cN0, cM0, G3, ER+, PR-, HER2+) - Unsigned    Malignant neoplasm of upper-outer quadrant of right breast in female, estrogen receptor positive ([place])  07/03/2019 Mammogram   Diagnostic Mammogram  IMPRESSION There is a 1.3x1.1cm focal asymmetry in the retroareolar anterior to middle depth right breast 2.2cm from the nipple.  There is a 2 cm focal asymmetry with appearance in the upper outer quadrant posterior depth of right breast 3 cm from nipple -Both suspicious and biopsy recommended.     07/18/2019 Initial Biopsy   Diagnosis 07/18/19 1. Breast, right, needle core biopsy, 12 o'clock - INVASIVE DUCTAL CARCINOMA. 2. Breast, right, needle core biopsy, upper outer quadrant - DUCTAL CARCINOMA IN SITU. Microscopic Comment 1. The invasive carcinoma is nuclear grade 3. The greatest linear extent of tumor in any one core is 11 mm. Ancillary studies will be reported separately. CKAE1AE3, GATA-3, ER and E-cadherin are  positive. PAX 8 is negative. CK7 is noncontributory. 2. The in situ carcinoma is high nuclear grade with central necrosis and calcifications. E-cadherin is positive. P63, Calponin and SMM-1 demonstrate the present of myoepithelium.   07/18/2019 Receptors her2   1. PROGNOSTIC INDICATORS 07/18/19 Results: IMMUNOHISTOCHEMICAL AND MORPHOMETRIC ANALYSIS PERFORMED MANUALLY The tumor cells are POSITIVE for Her2 (3+). Of note, the tumor shows heterogeneity in regards to Her2 expression. Estrogen Receptor: 95%, POSITIVE, STRONG STAINING INTENSITY Progesterone Receptor: 0%, NEGATIVE Proliferation Marker Ki67: 50%   07/24/2019 Initial Diagnosis   Malignant neoplasm of upper-outer quadrant of right breast in female, estrogen receptor positive ([place])   08/01/2019 Breast MRI   IMPRESSION: 1. Known invasive ductal carcinoma measures 1.2 centimeters in the 12 o'clock location. 2. Non mass enhancement measures 2.3 centimeters in the LATERAL portion of the RIGHT breast corresponding to DCIS on biopsy. 3. An additional mass in the UPPER OUTER QUADRANT of the RIGHT breast is 1.5 centimeters and warrants tissue diagnosis. This area correlates with distortion seen mammographically. 4. No axillary adenopathy or ancillary findings. LEFT breast is negative.     08/04/2019 Echocardiogram   IMPRESSIONS     1. Left ventricular ejection fraction, by estimation, is 65 to 70%. The  left ventricle has normal function. The left ventricle has no regional  wall motion abnormalities. Left ventricular diastolic parameters were  normal. The average left ventricular  global longitudinal strain is -20.4 %.   2. Right ventricular systolic function is normal. The right ventricular  size is normal.   3. The mitral valve is normal in structure. No evidence of mitral valve  regurgitation. No evidence of mitral stenosis.   4. The aortic valve is normal in structure. Aortic valve regurgitation is  not visualized. No aortic  stenosis is present.    08/10/2019 Genetic Testing   Negative genetic testing:  No pathogenic variants detected on the [facility] Breast Cancer STAT panel + Common Hereditary Cancers panel. A variant of uncertain significance (VUS) was detected in the ATM gene called c.3834C>A. The report date is 08/10/2019.  The Breast Cancer STAT Panel offered by [facility] includes sequencing and deletion/duplication analysis for the following 9 genes:  ATM, BRCA1, BRCA2, CDH1, CHEK2, PALB2, PTEN, STK11 and TP53. The Common Hereditary Cancers Panel offered by [facility] includes sequencing and/or deletion duplication testing of the following 48 genes: APC, ATM, AXIN2, BARD1, BMPR1A, BRCA1, BRCA2, BRIP1, CDH1, CDK4, CDKN2A (p14ARF), CDKN2A (p16INK4a), CHEK2, CTNNA1, DICER1, EPCAM (Deletion/duplication testing only), GREM1 (promoter region deletion/duplication testing only), KIT, MEN1, MLH1, MSH2, MSH3, MSH6, MUTYH, NBN, NF1, NHTL1, PALB2, PDGFRA, PMS2, POLD1, POLE, PTEN, RAD50, RAD51C, RAD51D, RNF43, SDHB, SDHC, SDHD, SMAD4, SMARCA4. STK11, TP53, TSC1, TSC2, and VHL.  The following genes were evaluated for sequence changes only: SDHA and HOXB13 c.251G>A variant only.    09/05/2019 Surgery   BILATERAL MASTECTOMY WITH RIGHT SENTINEL LYMPH NODE BIOPSY and PAC placement by Dr Barry Dienes    09/05/2019 Pathology Results   FINAL MICROSCOPIC DIAGNOSIS:   A. BREAST, LEFT, MASTECTOMY:  - Benign breast tissue.   B. BREAST, RIGHT, MASTECTOMY:  - Invasive ductal carcinoma, multifocal, 1.4 cm in greatest dimension,  [place] grade 3 of 3.  - Ductal carcinoma in situ, high nuclear grade with central necrosis and  calcifications.  - Margins of resection are not involved.  - Biopsy sites.  - See oncology table.   C. SENTINEL LYMPH NODE, RIGHT AXILLARY #1, BIOPSY:  - One lymph node, negative for carcinoma (0/1).   D. SENTINEL LYMPH NODE, RIGHT AXILLARY #2, BIOPSY:  - One lymph node, negative for carcinoma (0/1).   E. SENTINEL LYMPH  NODE, RIGHT AXILLARY #3, BIOPSY:  - One lymph node, negative for carcinoma (0/1).     Estrogen Receptor: 95%, positive, strong             Progesterone Receptor: 0%, negative             HER2: Positive (3+)             Ki-67: 50%    09/05/2019 Cancer Staging   Staging form: Breast, AJCC 8th Edition - Pathologic stage from 09/05/2019: Stage IA (pT1c, pN0, cM0, G3, ER+, PR-, HER2+) - Signed by Gardenia Phlegm, NP on 09/17/2019   10/22/2019 Surgery   Excision of right breast wound and APPLICATION OF A-CELL, INTEGRA BWM OR CELLERATE by Dr Marla Roe on 10/22/19   10/27/2019 -  Chemotherapy   Weekly Taxol and Herceptin for 12 weeks starting 10/27/19 then maintenance Herceptin q3weeks to complete 1 year treatment. Given recent right breast infection and debridement, will start Herceptin on 10/27/19 with loading dose (q2weeks) and add Taxol with week 3 on 11/10/19 then continuing with both weekly.  -Due to tinnitus, her Kanjinti (Herceptin) was changed to weekly Trastuzumab (Herceptin) on 11/24/19.       CURRENT THERAPY:  Weekly Taxol and Herceptinfor12 weeksstarting 8/2/21then maintenance Herceptinq3weeksto complete 1 yeartreatment.Taxol started on 11/10/19.   INTERVAL HISTORY:  Stephanie Chase is here for a follow up and treatment. She presents to the clinic alone. She notes her hair is falling out more. She declined wig prescription. She denies any current neuropathy at this time and no issues with hand function or ambulation. She continues to use ice bags during infusion. She  plans to consult with ENT in 2 days. She notes her ear ringing is mostly stable with occasional flares. She notes she is doing PT for her surgery. She is on PPI for acid reflux which is helping.     REVIEW OF SYSTEMS:   Constitutional: Denies fevers, chills or abnormal weight loss Eyes: Denies blurriness of vision Ears, nose, mouth, throat, and face: Denies mucositis or sore throat (+) Ear ringing with  occasional flares  Respiratory: Denies cough, dyspnea or wheezes Cardiovascular: Denies palpitation, chest discomfort or lower extremity swelling Gastrointestinal:  Denies nausea, heartburn or change in bowel habits Skin: Denies abnormal skin rashes Lymphatics: Denies new lymphadenopathy or easy bruising Neurological:Denies numbness, tingling or new weaknesses Behavioral/Psych: Mood is stable, no new changes  All other systems were reviewed with the patient and are negative.  MEDICAL HISTORY:  Past Medical History:  Diagnosis Date  . Anemia   . Anxiety   . Asthma   . Breast cancer ([place])   . COPD (chronic obstructive pulmonary disease) ([place])   . Discoid lupus   . Family history of bladder cancer   . Family history of BRCA gene mutation   . Family history of breast cancer   . Family history of prostate cancer   . GERD (gastroesophageal reflux disease)   . Hypothyroidism   . Pneumonia   . Thyroid disease     SURGICAL HISTORY: Past Surgical History:  Procedure Laterality Date  . ABLATION ON ENDOMETRIOSIS    . APPLICATION OF A-CELL OF EXTREMITY Right 10/22/2019   Procedure: EXCISION OF RIGHT BREAST WOUND WITH PRIMARY CLOSURE;  Surgeon: [name] Going, DO;  Location: [facility];  Service: Plastics;  Laterality: Right;  . BREAST LUMPECTOMY    . DEBRIDEMENT AND CLOSURE WOUND Right 10/22/2019   Procedure: Excision of right breast wound;  Surgeon: [name] Going, DO;  Location: [facility];  Service: Plastics;  Laterality: Right;  . MASTECTOMY W/ SENTINEL NODE BIOPSY Bilateral 09/05/2019   Procedure: BILATERAL MASTECTOMY WITH RIGHT SENTINEL LYMPH NODE BIOPSY;  Surgeon: Stark Klein, MD;  Location: [facility];  Service: General;  Laterality: Bilateral;  COMBINED WITH REGIONAL FOR POST OP PAIN  . PORTACATH PLACEMENT Left 09/05/2019   Procedure: INSERTION PORT-A-CATH WITH ULTRASOUND GUIDANCE;  Surgeon: Stark Klein, MD;  Location: [facility];  Service: General;  Laterality: Left;  . TONSILLECTOMY     . TUBAL LIGATION    . WISDOM TOOTH EXTRACTION      I have reviewed the social history and family history with the patient and they are unchanged from previous note.  ALLERGIES:  is allergic to bactrim [sulfamethoxazole-trimethoprim].  MEDICATIONS:  Current Outpatient Medications  Medication Sig Dispense Refill  . acetaminophen (TYLENOL) 500 MG tablet Take 500-1,000 mg by mouth every 6 (six) hours as needed for moderate pain.     . Albuterol Sulfate (PROAIR RESPICLICK) 301 (90 Base) MCG/ACT AEPB Inhale 2 puffs into the lungs every 6 (six) hours as needed (SOB). 1 each 0  . ALPRAZolam (XANAX) 0.25 MG tablet Take 1 tablet (0.25 mg total) by mouth daily as needed for anxiety. 30 tablet 0  . Ascorbic Acid (VITAMIN C) 1000 MG tablet Take 1,000 mg by mouth daily.    . cetirizine (ZYRTEC) 10 MG tablet Take 10 mg by mouth daily as needed for allergies.     . Cholecalciferol (VITAMIN D) 50 MCG (2000 UT) tablet Take 2,000 Units by mouth daily.    Marland Kitchen dextromethorphan (DELSYM) 30 MG/5ML liquid Take 60 mg  by mouth 2 (two) times daily as needed for cough.     . fluticasone (FLONASE) 50 MCG/ACT nasal spray Place 1 spray into both nostrils daily as needed for allergies.     . fluticasone (FLONASE) 50 MCG/ACT nasal spray Place 2 sprays into both nostrils in the morning and at bedtime. 16 g 5  . levothyroxine (SYNTHROID) 75 MCG tablet Take 75 mcg by mouth daily before breakfast.    . lidocaine-prilocaine (EMLA) cream Apply 1 application topically as needed. (Patient taking differently: Apply 1 application topically daily as needed (port access). ) 30 g 1  . liothyronine (CYTOMEL) 5 MCG tablet TAKE 2 TABLETS (=10MCG     TOTAL)     DAILY (Patient taking differently: Take 10 mcg by mouth daily. ) 180 tablet 1  . methocarbamol (ROBAXIN) 500 MG tablet Take 1 tablet (500 mg total) by mouth every 6 (six) hours as needed (use for muscle cramps/pain). 20 tablet 1  . Multiple Vitamin (MULTIVITAMIN WITH MINERALS) TABS  tablet Take 1 tablet by mouth 2 (two) times daily.     . nicotine (NICODERM CQ - DOSED IN MG/24 HOURS) 21 mg/24hr patch Place 21 mg onto the skin daily.    Marland Kitchen nystatin (MYCOSTATIN) 100000 UNIT/ML suspension Take 5 mLs (500,000 Units total) by mouth 4 (four) times daily. 473 mL 1  . ondansetron (ZOFRAN) 8 MG tablet Take 1 tablet (8 mg total) by mouth 2 (two) times daily as needed (Nausea or vomiting). 30 tablet 1  . OVER THE COUNTER MEDICATION Take 1 each by mouth 2 (two) times daily as needed (anxiety / sleep). CBD Gummies    . oxyCODONE (OXY IR/ROXICODONE) 5 MG immediate release tablet Take 1 tablet (5 mg total) by mouth every 6 (six) hours as needed for breakthrough pain. 20 tablet 0  . pantoprazole (PROTONIX) 40 MG tablet Take 1 tablet (40 mg total) by mouth daily. 30 tablet 2  . prochlorperazine (COMPAZINE) 10 MG tablet Take 1 tablet (10 mg total) by mouth every 6 (six) hours as needed (Nausea or vomiting). 30 tablet 1  . Zinc Sulfate 220 (50 Zn) MG TABS TAKE 1 TABLET (220 MG TOTAL) BY MOUTH IN THE MORNING AND AT BEDTIME. 60 tablet 0   No current facility-administered medications for this visit.   Facility-Administered Medications Ordered in Other Visits  Medication Dose Route Frequency Provider Last Rate Last Admin  . alum & mag hydroxide-simeth (MAALOX/MYLANTA) 200-200-20 MG/5ML suspension 30 mL  30 mL Oral Once Harle Stanford., PA-C      . heparin lock flush 100 unit/mL  500 Units Intracatheter Once PRN Truitt Merle, MD      . PACLitaxel (TAXOL) 144 mg in sodium chloride 0.9 % 250 mL chemo infusion (</= 91m/m2)  80 mg/m2 (Treatment Plan Recorded) Intravenous Once FTruitt Merle MD      . sodium chloride flush (NS) 0.9 % injection 10 mL  10 mL Intracatheter PRN FTruitt Merle MD      . tTheotis Burrow([facility] 150 mg in sodium chloride 0.9 % 250 mL chemo infusion  150 mg Intravenous Once FTruitt Merle MD        PHYSICAL EXAMINATION: ECOG PERFORMANCE STATUS: 1 - Symptomatic but completely ambulatory   Vitals:   12/15/19 0919  BP: 122/83  Pulse: 84  Resp: 20  Temp: 97.8 F (36.6 C)  SpO2: 98%   Filed Weights   12/15/19 0919  Weight: 166 lb 4.8 oz (75.4 kg)    GENERAL:alert, no distress and comfortable  SKIN: skin color, texture, turgor are normal, no rashes or significant lesions EYES: normal, Conjunctiva are pink and non-injected, sclera clear  NECK: supple, thyroid normal size, non-tender, without nodularity LYMPH:  no palpable lymphadenopathy in the cervical, axillary  LUNGS: clear to auscultation and percussion with normal breathing effort HEART: regular rate & rhythm and no murmurs and no lower extremity edema ABDOMEN:abdomen soft, non-tender and normal bowel sounds Musculoskeletal:no cyanosis of digits and no clubbing  NEURO: alert & oriented x 3 with fluent speech, no focal motor/sensory deficits BREAST: S/p b/l mastectomy: Surgical incision healed well with mild soft tissue. No palpable mass, nodules or adenopathy bilaterally. Breast exam benign.   LABORATORY DATA:  I have reviewed the data as listed CBC Latest Ref Rng & Units 12/15/2019 12/08/2019 12/02/2019  WBC 4.0 - 10.5 K/uL 7.7 9.5 7.5  Hemoglobin 12.0 - 15.0 g/dL 12.5 13.3 12.8  Hematocrit 36 - 46 % 37.5 39.6 38.7  Platelets 150 - 400 K/uL 281 312 323     CMP Latest Ref Rng & Units 12/15/2019 12/08/2019 12/02/2019  Glucose 70 - 99 mg/dL 83 79 86  BUN 6 - 20 mg/dL '7 13 6  ' Creatinine 0.44 - 1.00 mg/dL 0.75 0.64 0.67  Sodium 135 - 145 mmol/L 137 135 137  Potassium 3.5 - 5.1 mmol/L 4.3 4.2 4.1  Chloride 98 - 111 mmol/L 103 102 106  CO2 22 - 32 mmol/L '27 25 24  ' Calcium 8.9 - 10.3 mg/dL 10.1 10.2 9.9  Total Protein 6.5 - 8.1 g/dL 7.0 7.7 7.3  Total Bilirubin 0.3 - 1.2 mg/dL 0.3 0.3 0.3  Alkaline Phos 38 - 126 U/L 123 132(H) 121  AST 15 - 41 U/L 41 54(H) 46(H)  ALT 0 - 44 U/L 68(H) 84(H) 57(H)      RADIOGRAPHIC STUDIES: I have personally reviewed the radiological images as listed and agreed with the findings  in the report. No results found.   ASSESSMENT & PLAN:  Marlea Gambill is a 59 y.o. female with    1.Malignant neoplasm of upper-outer quadrant of right breast,invasive ductal carcinoma and DCIS,StageIA, pT1cN0M0,ER+/HER2+, PR-,Grade III -She was diagnosed in 06/2019.Given multifocal disease, she underwent right mastectomy and SLNB by Dr Barry Dienes on 09/05/19.Path showedmultifocal disease in right breast, largest 1.4cm, which were found to be high grade invasive ductal carcinoma with components of DCIS. -Given her HER2 positive high grade disease, I started her on adjuvant chemo with weekly paclitaxel and Herceptinfor 12 weeks starting 10/27/19 (taxol started on 11/10/19), followed by maintenance Herceptin q3weeks to complete 1 year treatment. The goal of chemo is curative.  -Due to tinnitus, her Kanjinti (Herceptin) was changed to [place] on 11/24/19.  -S/p week 5 she is overall stable. She has stable tinnitus, no neuropathy so far and has further hair loss. Labs reviewed and adequate to proceed with week 6 Paclitaxel and Trastuzumab today.   -For pain from surgery I recommend she continue PT and exercise. I also recommend continue cryotherapy with infusions.  -F/u in 2 weeks with NP Lacie.   2. Genetic Testingnegative for pathogenetic mutations -Testing results did showA variant of uncertain significance (VUS) was detected in the ATM gene called c.3834C>A. The report date is 08/10/2019.She can repeat testing in 5-10 years.  3. Smoking Cessation, COPD -She has been smoking since the 2nd grade. She has been able to cut downto 5-15 cigarettes a day. Smoking cessation has been reviewed with her.  -Continue albuterol inhaler and Singulair and f/u with her PCP.   4.  Comorbidities:DiscoidLupus, Vitiligo, Thyroid disease.  -She was diagnosed with Lupus 10 years ago. She uses steroid cream as needed for her lupus rashes, some skin lesions of which have been removed. -She sees her  dermatologist Dr Nevada Crane and their [place].   5. Social Support, Anxiety, Insomnia -She lives alone but has good support from her 2 daughters but they live out of town.  -Her daughter notes the patient has anxiety or panic attacks when she gets overwhelmed or disappointed. -On low doseXanaxPRN.  -Ipreviouslyoffered her the chance to speak with out SW, she declined.  6. Mild Transminitis  -Her 11/14/19 labs show mild Transaminitis. I discussed this could be from chemo. -I recommend she avoid alcohol while on chemo and not use too much Tylenol. -improved. Continue monitoring  7. Tinnitus -Onset after 1-2 cycles of Kanjinti and was switched to Trastuzumab. She continues to have pain in mouth along with sensation of fluid behind right ear.  -She will consult with ENT Dr Lucia Gaskins on 9/22  PLAN: -I refilled her albuterol inhaler today (future refill per PCP) -Labs reviewed and adequate to proceed with Taxol and trastuzumab today (our pharmacy does not have Trazimera today and will give Kanjini) -Lab, flush, and Taxol andtrastuzumabweekly  -F/u in 2, 4, 6 weeks -ENT consult on 9/22   No problem-specific Assessment & Plan notes found for this encounter.   No orders of the defined types were placed in this encounter.  All questions were answered. The patient knows to call the clinic with any problems, questions or concerns. No barriers to learning was detected. The total time spent in the appointment was 25 minutes.     Truitt Merle, MD 12/15/2019   I, Joslyn Devon, am acting as scribe for Truitt Merle, MD.   I have reviewed the above documentation for accuracy and completeness, and I agree with the above.

## 2019-12-15 ENCOUNTER — Other Ambulatory Visit: Payer: Self-pay

## 2019-12-15 ENCOUNTER — Encounter: Payer: Self-pay | Admitting: Hematology

## 2019-12-15 ENCOUNTER — Encounter: Payer: Self-pay | Admitting: *Deleted

## 2019-12-15 ENCOUNTER — Inpatient Hospital Stay: Payer: BC Managed Care – PPO

## 2019-12-15 ENCOUNTER — Encounter: Payer: Self-pay | Admitting: Radiology

## 2019-12-15 ENCOUNTER — Inpatient Hospital Stay (HOSPITAL_BASED_OUTPATIENT_CLINIC_OR_DEPARTMENT_OTHER): Payer: BC Managed Care – PPO | Admitting: Hematology

## 2019-12-15 VITALS — BP 122/83 | HR 84 | Temp 97.8°F | Resp 20 | Ht 67.0 in | Wt 166.3 lb

## 2019-12-15 DIAGNOSIS — C50411 Malignant neoplasm of upper-outer quadrant of right female breast: Secondary | ICD-10-CM

## 2019-12-15 DIAGNOSIS — J709 Respiratory conditions due to unspecified external agent: Secondary | ICD-10-CM

## 2019-12-15 DIAGNOSIS — Z170 Estrogen receptor positive status [ER+]: Secondary | ICD-10-CM

## 2019-12-15 DIAGNOSIS — Z5112 Encounter for antineoplastic immunotherapy: Secondary | ICD-10-CM | POA: Diagnosis not present

## 2019-12-15 LAB — CMP (CANCER CENTER ONLY)
ALT: 68 U/L — ABNORMAL HIGH (ref 0–44)
AST: 41 U/L (ref 15–41)
Albumin: 3.8 g/dL (ref 3.5–5.0)
Alkaline Phosphatase: 123 U/L (ref 38–126)
Anion gap: 7 (ref 5–15)
BUN: 7 mg/dL (ref 6–20)
CO2: 27 mmol/L (ref 22–32)
Calcium: 10.1 mg/dL (ref 8.9–10.3)
Chloride: 103 mmol/L (ref 98–111)
Creatinine: 0.75 mg/dL (ref 0.44–1.00)
GFR, Est AFR Am: >60 mL/min (ref >60)
GFR, Estimated: >60 mL/min (ref >60)
Glucose, Bld: 83 mg/dL (ref 70–99)
Potassium: 4.3 mmol/L (ref 3.5–5.1)
Sodium: 137 mmol/L (ref 135–145)
Total Bilirubin: 0.3 mg/dL (ref 0.3–1.2)
Total Protein: 7 g/dL (ref 6.5–8.1)

## 2019-12-15 LAB — CBC WITH DIFFERENTIAL (CANCER CENTER ONLY)
Abs Immature Granulocytes: 0.08 10*3/uL — ABNORMAL HIGH (ref 0.00–0.07)
Basophils Absolute: 0.1 10*3/uL (ref 0.0–0.1)
Basophils Relative: 1 %
Eosinophils Absolute: 0.1 10*3/uL (ref 0.0–0.5)
Eosinophils Relative: 1 %
HCT: 37.5 % (ref 36.0–46.0)
Hemoglobin: 12.5 g/dL (ref 12.0–15.0)
Immature Granulocytes: 1 %
Lymphocytes Relative: 29 %
Lymphs Abs: 2.2 10*3/uL (ref 0.7–4.0)
MCH: 28.5 pg (ref 26.0–34.0)
MCHC: 33.3 g/dL (ref 30.0–36.0)
MCV: 85.4 fL (ref 80.0–100.0)
Monocytes Absolute: 0.6 10*3/uL (ref 0.1–1.0)
Monocytes Relative: 7 %
Neutro Abs: 4.7 10*3/uL (ref 1.7–7.7)
Neutrophils Relative %: 61 %
Platelet Count: 281 10*3/uL (ref 150–400)
RBC: 4.39 MIL/uL (ref 3.87–5.11)
RDW: 13.9 % (ref 11.5–15.5)
WBC Count: 7.7 10*3/uL (ref 4.0–10.5)
nRBC: 0 % (ref 0.0–0.2)

## 2019-12-15 MED ORDER — SODIUM CHLORIDE 0.9 % IV SOLN
80.0000 mg/m2 | Freq: Once | INTRAVENOUS | Status: AC
  Administered 2019-12-15: 144 mg via INTRAVENOUS
  Filled 2019-12-15: qty 24

## 2019-12-15 MED ORDER — ACETAMINOPHEN 325 MG PO TABS
650.0000 mg | ORAL_TABLET | Freq: Once | ORAL | Status: AC
  Administered 2019-12-15: 650 mg via ORAL

## 2019-12-15 MED ORDER — DIPHENHYDRAMINE HCL 50 MG/ML IJ SOLN
25.0000 mg | Freq: Once | INTRAMUSCULAR | Status: AC
  Administered 2019-12-15: 25 mg via INTRAVENOUS

## 2019-12-15 MED ORDER — SODIUM CHLORIDE 0.9% FLUSH
10.0000 mL | INTRAVENOUS | Status: DC | PRN
  Administered 2019-12-15: 10 mL
  Filled 2019-12-15: qty 10

## 2019-12-15 MED ORDER — ACETAMINOPHEN 325 MG PO TABS
ORAL_TABLET | ORAL | Status: AC
  Filled 2019-12-15: qty 2

## 2019-12-15 MED ORDER — FAMOTIDINE IN NACL 20-0.9 MG/50ML-% IV SOLN
INTRAVENOUS | Status: AC
  Filled 2019-12-15: qty 50

## 2019-12-15 MED ORDER — PROAIR RESPICLICK 108 (90 BASE) MCG/ACT IN AEPB: 2.0000 | INHALATION_SPRAY | Freq: Four times a day (QID) | RESPIRATORY_TRACT | 0 refills | Status: DC | PRN

## 2019-12-15 MED ORDER — HEPARIN SOD (PORK) LOCK FLUSH 100 UNIT/ML IV SOLN
500.0000 [IU] | Freq: Once | INTRAVENOUS | Status: AC | PRN
  Administered 2019-12-15: 500 [IU]
  Filled 2019-12-15: qty 5

## 2019-12-15 MED ORDER — DIPHENHYDRAMINE HCL 50 MG/ML IJ SOLN
INTRAMUSCULAR | Status: AC
  Filled 2019-12-15: qty 1

## 2019-12-15 MED ORDER — SODIUM CHLORIDE 0.9 % IV SOLN
10.0000 mg | Freq: Once | INTRAVENOUS | Status: AC
  Administered 2019-12-15: 10 mg via INTRAVENOUS
  Filled 2019-12-15: qty 10

## 2019-12-15 MED ORDER — SODIUM CHLORIDE 0.9 % IV SOLN
40.0000 mg | Freq: Once | INTRAVENOUS | Status: AC
  Administered 2019-12-15: 40 mg via INTRAVENOUS
  Filled 2019-12-15: qty 4

## 2019-12-15 MED ORDER — TRASTUZUMAB-ANNS CHEMO 150 MG IV SOLR
150.0000 mg | Freq: Once | INTRAVENOUS | Status: AC
  Administered 2019-12-15: 150 mg via INTRAVENOUS
  Filled 2019-12-15: qty 7.14

## 2019-12-15 MED ORDER — ALTEPLASE 2 MG IJ SOLR
2.0000 mg | Freq: Once | INTRAMUSCULAR | Status: DC | PRN
  Filled 2019-12-15: qty 2

## 2019-12-15 MED ORDER — SODIUM CHLORIDE 0.9 % IV SOLN
Freq: Once | INTRAVENOUS | Status: AC
  Filled 2019-12-15: qty 250

## 2019-12-15 MED ORDER — SODIUM CHLORIDE 0.9 % IV SOLN
40.0000 mg | Freq: Once | INTRAVENOUS | Status: DC
  Filled 2019-12-15: qty 4

## 2019-12-15 NOTE — Patient Instructions (Signed)
[facility] Discharge Instructions for Patients Receiving Chemotherapy  Today you received the following chemotherapy agents trastuzumab, paclitaxel  To help prevent nausea and vomiting after your treatment, we encourage you to take your nausea medication as directed.   If you develop nausea and vomiting that is not controlled by your nausea medication, call the clinic.   BELOW ARE SYMPTOMS THAT SHOULD BE REPORTED IMMEDIATELY:  *FEVER GREATER THAN 100.5 F  *CHILLS WITH OR WITHOUT FEVER  NAUSEA AND VOMITING THAT IS NOT CONTROLLED WITH YOUR NAUSEA MEDICATION  *UNUSUAL SHORTNESS OF BREATH  *UNUSUAL BRUISING OR BLEEDING  TENDERNESS IN MOUTH AND THROAT WITH OR WITHOUT PRESENCE OF ULCERS  *URINARY PROBLEMS  *BOWEL PROBLEMS  UNUSUAL RASH Items with * indicate a potential emergency and should be followed up as soon as possible.  Feel free to call the clinic should you have any questions or concerns. The clinic phone number is [phone].  Please show the CHEMO ALERT CARD at check-in to the Emergency Department and triage nurse.   

## 2019-12-15 NOTE — Research (Signed)
R1165, A PROSPECTIVE OBSERVATIONAL COHORT STUDY TO DEVELOP A PREDICTIVE MODEL OFTAXANE-INDUCED PERIPHERAL NEUROPATHY IN CANCER PATIENTS.  12/15/19   9:45AM  4 WEEK ASSESSMENT:  Supplements, Topical Agents and Other Treatments: Reviewed with patient and CRFs completed.Patient is taking acetaminophen but not for neuropathy symptoms.She is not using menthol or any other topical creams, but is using cooling gloves and ice on her feet during infusion. Patient stated is no longer taking her multivitamin. She stated she stopped about 2 weeks ago due to lab work findings.  Neuropen Assessment: Completed per protocol by Wilber Bihari, certified research RNwith assistance recording by myself.   [place] Assessment: Completed per protocol by Wilber Bihari, certified research RNwith assistance timing and recording by myself.   PROs: All PROs were completed by patient and responses were entered into RAVE.  Physician Assessments: CTCAE and Treatment Burden forms completed and signed by Dr. Burr Medico.Patient denies any neuropathy symptoms with no history of neuropathy.  Plan: Informed patient of next study assessmentsduein 4 weeks(+/- 2 weeks)and will be done on same day as treatment. Plan for this visit on 01/19/20 since she is seeing provider on that day.Thanked patient for participating in this study today. Encouraged her to call clinic if any questions or concerns prior to next appointment. She verbalized understanding.  Carol Ada, RT(R)(T) Clinical Research Coordinator

## 2019-12-15 NOTE — Patient Instructions (Signed)

## 2019-12-15 NOTE — Progress Notes (Addendum)
Patient previously switched from [place] to [place] due to tinnitus. Tinnitus has remained basically unchanged while on Trazimera. She will ENT on 9/22.  Pharmacy does not have any Trazimera in stock today. Patient would like to go back to [place] today only rather than rescheduling for Trazimera or holding treatment. Kanjinti and [facility] are preferred agents for her insurance.  MD aware of change for today.   Hardie Pulley, PharmD, BCPS, BCOP

## 2019-12-16 ENCOUNTER — Ambulatory Visit: Payer: BC Managed Care – PPO

## 2019-12-16 DIAGNOSIS — Z170 Estrogen receptor positive status [ER+]: Secondary | ICD-10-CM

## 2019-12-16 DIAGNOSIS — M25511 Pain in right shoulder: Secondary | ICD-10-CM

## 2019-12-16 DIAGNOSIS — C50411 Malignant neoplasm of upper-outer quadrant of right female breast: Secondary | ICD-10-CM | POA: Diagnosis not present

## 2019-12-16 DIAGNOSIS — R293 Abnormal posture: Secondary | ICD-10-CM

## 2019-12-16 DIAGNOSIS — M25512 Pain in left shoulder: Secondary | ICD-10-CM

## 2019-12-16 DIAGNOSIS — M25612 Stiffness of left shoulder, not elsewhere classified: Secondary | ICD-10-CM

## 2019-12-16 DIAGNOSIS — M25611 Stiffness of right shoulder, not elsewhere classified: Secondary | ICD-10-CM

## 2019-12-16 NOTE — Therapy (Addendum)
[place], [place], [address] Phone: [phone]   Fax:  [phone]  Physical Therapy Treatment  Patient Details  Name: Stephanie Chase MRN: [id number] Date of Birth: 18-Feb-1961 Referring Provider (PT): Dr. Stark Klein   Encounter Date: 12/16/2019   PT End of Session - 12/16/19 0814    Visit Number 11    Number of Visits 22    Date for PT Re-Evaluation 01/13/20    PT Start Time 0804    PT Stop Time 0902    PT Time Calculation (min) 58 min    Activity Tolerance Patient tolerated treatment well    Behavior During Therapy [facility] for tasks assessed/performed           Past Medical History:  Diagnosis Date  . Anemia   . Anxiety   . Asthma   . Breast cancer ([place])   . COPD (chronic obstructive pulmonary disease) ([place])   . Discoid lupus   . Family history of bladder cancer   . Family history of BRCA gene mutation   . Family history of breast cancer   . Family history of prostate cancer   . GERD (gastroesophageal reflux disease)   . Hypothyroidism   . Pneumonia   . Thyroid disease     Past Surgical History:  Procedure Laterality Date  . ABLATION ON ENDOMETRIOSIS    . APPLICATION OF A-CELL OF EXTREMITY Right 10/22/2019   Procedure: EXCISION OF RIGHT BREAST WOUND WITH PRIMARY CLOSURE;  Surgeon: [name] Going, DO;  Location: [place];  Service: Plastics;  Laterality: Right;  . BREAST LUMPECTOMY    . DEBRIDEMENT AND CLOSURE WOUND Right 10/22/2019   Procedure: Excision of right breast wound;  Surgeon: [name] Going, DO;  Location: [place];  Service: Plastics;  Laterality: Right;  . MASTECTOMY W/ SENTINEL NODE BIOPSY Bilateral 09/05/2019   Procedure: BILATERAL MASTECTOMY WITH RIGHT SENTINEL LYMPH NODE BIOPSY;  Surgeon: Stark Klein, MD;  Location: [place];  Service: General;  Laterality: Bilateral;  COMBINED WITH REGIONAL FOR POST OP PAIN  . PORTACATH PLACEMENT Left 09/05/2019   Procedure:  INSERTION PORT-A-CATH WITH ULTRASOUND GUIDANCE;  Surgeon: Stark Klein, MD;  Location: [place];  Service: General;  Laterality: Left;  . TONSILLECTOMY    . TUBAL LIGATION    . WISDOM TOOTH EXTRACTION      There were no vitals filed for this visit.   Subjective Assessment - 12/16/19 0807    Subjective I've tried the new band exercises once or so but it caused me some pain at my chest and I just kept doing the exercises without the band. It feels like alot of pulling at the Rt>Lt incision. I ended up flying to [place] last week to visit my mom before her surgery, which is today. So that went really good.    Pertinent History Bil masectomy 09/05/19 with 3 lymph nodes removed and negative on the R and benign tisuse on the L. Patient was diagnosed on 06/09/2019 with right grade III invasive ductal carcinoma breast cancer. It mesures 1.3 cm in the upper outer quadrant with 1.6 cm of calcifications and DCIS. It is ER positive, PR negative, and HER2 positive with a Ki67 of 50%. She smokes about 1 pack/day.    Patient Stated Goals I want to get back to kayaking.    Currently in Pain? No/denies              [facility] PT Assessment - 12/16/19 0001  AROM   Right Shoulder Flexion 149 Degrees    Right Shoulder ABduction 152 Degrees    Right Shoulder Internal Rotation 59 Degrees    Left Shoulder Flexion 154 Degrees    Left Shoulder ABduction 170 Degrees    Left Shoulder Internal Rotation 61 Degrees                         [facility] Adult PT Treatment/Exercise - 12/16/19 0001      Shoulder Exercises: Pulleys   Flexion 2 minutes    ABduction 2 minutes      Shoulder Exercises: Therapy Ball   Flexion Both;10 reps   forward lean into end of stretch   ABduction Both;10 reps   same side lean into end of stretch   ABduction Limitations Some pain in top of shoulder with Rt abd      Shoulder Exercises: Isometric Strengthening   Flexion 5X5"   Rt UE in doorway   Extension 5X5"   Rt UE in doorway     External Rotation 5X5"   Rt UE in doorway    ABduction 5X5"   Rt UE in doorway     Manual Therapy   Soft tissue mobilization With coconut oil to Rt pectoralis insertion during P/ROM    Scapular Mobilization --    Passive ROM In Supine to Rt shoulder into flexion, abduction and D2 to pts available end ROM; scapular depression thorugout for all motions which allowed less discomfort where she has a "catch" and she was able to achieve full P/ROM today, then same to Lt side but less time spent due to better motion                  PT Education - 12/16/19 0825    Education Details Isometrics for shoulder strengthening    Person(s) Educated Patient    Methods Explanation;Demonstration;Handout    Comprehension Verbalized understanding;Returned demonstration            PT Short Term Goals - 12/16/19 1005      PT SHORT TERM GOAL #1   Title Pt will be independent with HEP and Modified MLD for the anterior trunk within 2 weeks in order to demonstrate autonomy of care.    Baseline pt provided with HEP today; pt independent with this now-12/16/19    Status Achieved             PT Long Term Goals - 12/16/19 0855      PT LONG TERM GOAL #1   Title Pt will demonstrate 150 degrees bil shoulder flexion and abduction within 4 weeks to demonstrate return to pre-operative AROM.    Baseline R shoulder flexion: 110, abduction: 69 L shoulder flexion: 118, abduction:  77; approximate AA/ROM with pulleys Rt 120 and abduction 140 degrees, Lt 150 degrees flexion and abduction (forgot to measure but this is visibly much improved since last measured)-11/13/19; Rt flex149, abd 152 with discomfort and pulling reported at top of shoulder and Lt flex 154, abd 170 - 12/16/19    Status Partially Met      PT LONG TERM GOAL #2   Title Pt will report 75% improvement in functional mobility and pain in order to demonstrate an improvement in subjective quality of life.    Baseline 2/10 pain, decresaed ROM below  shoulder height. MOdifications with bathing/dressing; pt reports 50% improvement at this time as she is still limited with vaccuuming, lifting and reaching above head-11/13/19; pt reports  75-80% improvement at this time-12/16/19    Status Achieved      PT LONG TERM GOAL #3   Title Pt to be independent with HEP for improved A/ROM for bil shoulders and bil UE and postural strength to improve ease with ADLs.    Baseline No current HEP though began this today as she now has clearance for ROM from surgeon-11/13/19; pt is independenet with HEP thus far and progressed this today-12/16/19    Status On-going                 Plan - 12/16/19 0904    Clinical Impression Statement Pt overall is progressing well with improved A/ROM partially meeting that goal. She is making good progress towards independence with HEP, though is still benefitting from progressing this due to discomfort noted at top of shoulder. Has also met reduction of pain goal. Renewal done today reducing frequency to 1x/wk to finalize HEP and work towards increasing end ROM without repotr of discomfort at top of Rt shoulder.    Personal Factors and Comorbidities Comorbidity 1    Comorbidities bil masectomy with 3 lymph node removal on the R    Stability/Clinical Decision Making Stable/Uncomplicated    Rehab Potential Good    PT Frequency 2x / week    PT Duration 4 weeks    PT Treatment/Interventions Therapeutic activities;Therapeutic exercise;Neuromuscular re-education;Manual techniques;Cryotherapy;Moist Heat    PT Next Visit Plan Renewal done this week for 1x/wk, x4 more weeks. Reassess new HEP and progress as tolerated. Able to tolerate supine scapular series again? Cont manual therapy working to improve end bil shoulder P/ROM.    PT Home Exercise Plan Standing dowel exercises, supine scapular series; bil shoulder isometrics    Consulted and Agree with Plan of Care Patient           Patient will benefit from skilled therapeutic  intervention in order to improve the following deficits and impairments:  Decreased knowledge of precautions, Pain, Decreased range of motion, Increased edema  Visit Diagnosis: Malignant neoplasm of upper-outer quadrant of right breast in female, estrogen receptor positive ([place]) - Plan: PT plan of care cert/re-cert  Stiffness of left shoulder, not elsewhere classified - Plan: PT plan of care cert/re-cert  Stiffness of right shoulder, not elsewhere classified - Plan: PT plan of care cert/re-cert  Acute pain of right shoulder - Plan: PT plan of care cert/re-cert  Acute pain of left shoulder - Plan: PT plan of care cert/re-cert  Abnormal posture - Plan: PT plan of care cert/re-cert     Problem List Patient Active Problem List   Diagnosis Date Noted  . Acquired absence of breast and absent nipple, bilateral 10/07/2019  . Breast wound, right, initial encounter 10/07/2019  . Breast cancer of upper-outer quadrant of right female breast ([place]) 09/05/2019  . Genetic testing 08/12/2019  . Family history of BRCA gene mutation 07/31/2019  . Family history of breast cancer   . Family history of prostate cancer   . Family history of bladder cancer   . Malignant neoplasm of upper-outer quadrant of right breast in female, estrogen receptor positive ([place]) 07/24/2019  . Annual physical exam 03/31/2016  . Occupational bronchitis ([place]) 03/03/2015  . Hyperlipidemia 03/30/2014  . 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 Generalized osteoarthritis 02/23/2012  . Plantar fasciitis, bilateral 02/23/2012  . Vitiligo 02/23/2012  . Difficulty hearing 02/13/2012  . History of lupus 12/21/2011  . Vitamin D deficiency 10/11/2010  . Hypothyroidism 06/21/2010  . Systemic lupus erythematosus ([place]) 06/14/2010  Otelia Limes, PTA 12/16/2019, 10:06 AM  [place], [place], [address] Phone: [phone]   Fax:  [phone]  Name:  Debra Colon MRN: [id number] Date of Birth: [birth year]

## 2019-12-16 NOTE — Patient Instructions (Signed)

## 2019-12-17 ENCOUNTER — Encounter (INDEPENDENT_AMBULATORY_CARE_PROVIDER_SITE_OTHER): Payer: Self-pay | Admitting: Otolaryngology

## 2019-12-17 ENCOUNTER — Ambulatory Visit (INDEPENDENT_AMBULATORY_CARE_PROVIDER_SITE_OTHER): Payer: BC Managed Care – PPO | Admitting: Otolaryngology

## 2019-12-17 ENCOUNTER — Telehealth: Payer: Self-pay | Admitting: Hematology

## 2019-12-17 ENCOUNTER — Other Ambulatory Visit: Payer: Self-pay

## 2019-12-17 VITALS — Temp 97.2°F

## 2019-12-17 DIAGNOSIS — H903 Sensorineural hearing loss, bilateral: Secondary | ICD-10-CM

## 2019-12-17 DIAGNOSIS — H9313 Tinnitus, bilateral: Secondary | ICD-10-CM | POA: Diagnosis not present

## 2019-12-17 NOTE — Progress Notes (Addendum)
HPI: Stephanie Chase is a 59 y.o. female who presents for evaluation of tinnitus.  Patient states that she was started on chemotherapy in August for breast cancer and ever since she started chemotherapy she has noticed high-pitched ringing in her ears.  She denies loud noise exposure although she does shoot guns but uses ear protection whenever she shoots guns.  She stated that she had a hearing test over 10 years ago that showed a little worse hearing in the left ear compared to the right but no substantial hearing loss.  She has not noted any significant change in her hearing but does complain of the chronic ringing in the ears.  Past Medical History:  Diagnosis Date  . Anemia   . Anxiety   . Asthma   . Breast cancer ([place])   . COPD (chronic obstructive pulmonary disease) ([place])   . Discoid lupus   . Family history of bladder cancer   . Family history of BRCA gene mutation   . Family history of breast cancer   . Family history of prostate cancer   . GERD (gastroesophageal reflux disease)   . Hypothyroidism   . Pneumonia   . Thyroid disease    Past Surgical History:  Procedure Laterality Date  . ABLATION ON ENDOMETRIOSIS    . APPLICATION OF A-CELL OF EXTREMITY Right 10/22/2019   Procedure: EXCISION OF RIGHT BREAST WOUND WITH PRIMARY CLOSURE;  Surgeon: [name] Going, DO;  Location: [place];  Service: Plastics;  Laterality: Right;  . BREAST LUMPECTOMY    . DEBRIDEMENT AND CLOSURE WOUND Right 10/22/2019   Procedure: Excision of right breast wound;  Surgeon: [name] Going, DO;  Location: [facility];  Service: Plastics;  Laterality: Right;  . MASTECTOMY W/ SENTINEL NODE BIOPSY Bilateral 09/05/2019   Procedure: BILATERAL MASTECTOMY WITH RIGHT SENTINEL LYMPH NODE BIOPSY;  Surgeon: Stark Klein, MD;  Location: [facility];  Service: General;  Laterality: Bilateral;  COMBINED WITH REGIONAL FOR POST OP PAIN  . PORTACATH PLACEMENT Left 09/05/2019   Procedure: INSERTION PORT-A-CATH WITH ULTRASOUND  GUIDANCE;  Surgeon: Stark Klein, MD;  Location: [place];  Service: General;  Laterality: Left;  . TONSILLECTOMY    . TUBAL LIGATION    . WISDOM TOOTH EXTRACTION     Social History   Socioeconomic History  . Marital status: Divorced    Spouse name: Not on file  . Number of children: 2  . Years of education: Not on file  . Highest education level: Not on file  Occupational History  . Not on file  Tobacco Use  . Smoking status: Current Every Day Smoker    Packs/day: 1.00    Years: 50.00    Pack years: 50.00    Types: Cigarettes    Start date: 33  . Smokeless tobacco: Never Used  Vaping Use  . Vaping Use: Never used  Substance and Sexual Activity  . Alcohol use: Yes    Comment: once a month  . Drug use: No  . Sexual activity: Yes    Partners: Male    Birth control/protection: Surgical    Comment: BTL and ablation  Other Topics Concern  . Not on file  Social History Narrative  . Not on file   Social Determinants of Health   Financial Resource Strain:   . Difficulty of Paying Living Expenses: Not on file  Food Insecurity:   . Worried About Charity fundraiser in the Last Year: Not on file  . Ran Out of  Food in the Last Year: Not on file  Transportation Needs:   . Lack of Transportation (Medical): Not on file  . Lack of Transportation (Non-Medical): Not on file  Physical Activity:   . Days of Exercise per Week: Not on file  . Minutes of Exercise per Session: Not on file  Stress:   . Feeling of Stress : Not on file  Social Connections:   . Frequency of Communication with Friends and Family: Not on file  . Frequency of Social Gatherings with Friends and Family: Not on file  . Attends Religious Services: Not on file  . Active Member of Clubs or Organizations: Not on file  . Attends Archivist Meetings: Not on file  . Marital Status: Not on file   Family History  Problem Relation Age of Onset  . Breast cancer Mother 62  . Diabetes Mother   . Bladder  Cancer Mother 52       ureteral cancer  . Cancer Mother        blood cancer  . Heart attack Father   . Hyperlipidemia Father   . Hypertension Father   . Alzheimer's disease Father   . Prostate cancer Father        dx. >50  . Alzheimer's disease Other   . Cancer Maternal Uncle        prostate or colon  . BRCA 1/2 Daughter   . Breast cancer Other 45       maternal great-aunt  . Cancer Cousin        unknown type; dx. in her 60s (maternal cousin)   Allergies  Allergen Reactions  . Bactrim [Sulfamethoxazole-Trimethoprim] Itching and Rash   Prior to Admission medications   Medication Sig Start Date End Date Taking? Authorizing Provider  acetaminophen (TYLENOL) 500 MG tablet Take 500-1,000 mg by mouth every 6 (six) hours as needed for moderate pain.    Yes [provider]  Albuterol Sulfate (PROAIR RESPICLICK) 803 (90 Base) MCG/ACT AEPB Inhale 2 puffs into the lungs every 6 (six) hours as needed (SOB). 12/15/19  Yes Truitt Merle, MD  ALPRAZolam Duanne Moron) 0.25 MG tablet Take 1 tablet (0.25 mg total) by mouth daily as needed for anxiety. 11/17/19  Yes Truitt Merle, MD  Ascorbic Acid (VITAMIN C) 1000 MG tablet Take 1,000 mg by mouth daily.   Yes [provider]  cetirizine (ZYRTEC) 10 MG tablet Take 10 mg by mouth daily as needed for allergies.  04/29/19  Yes [provider]  Cholecalciferol (VITAMIN D) 50 MCG (2000 UT) tablet Take 2,000 Units by mouth daily.   Yes [provider]  dextromethorphan (DELSYM) 30 MG/5ML liquid Take 60 mg by mouth 2 (two) times daily as needed for cough.    Yes [provider]  fluticasone (FLONASE) 50 MCG/ACT nasal spray Place 1 spray into both nostrils daily as needed for allergies.    Yes [provider]  fluticasone (FLONASE) 50 MCG/ACT nasal spray Place 2 sprays into both nostrils in the morning and at bedtime. 11/26/19  Yes Tanner, Lyndon Code., PA-C  levothyroxine (SYNTHROID) 75 MCG tablet Take 75 mcg by mouth daily before  breakfast.   Yes [provider]  lidocaine-prilocaine (EMLA) cream Apply 1 application topically as needed. Patient taking differently: Apply 1 application topically daily as needed (port access).  09/24/19  Yes Truitt Merle, MD  liothyronine (CYTOMEL) 5 MCG tablet TAKE 2 TABLETS (=10MCG     TOTAL)     DAILY Patient taking  differently: Take 10 mcg by mouth daily.  03/06/18  Yes Silverio Decamp, MD  methocarbamol (ROBAXIN) 500 MG tablet Take 1 tablet (500 mg total) by mouth every 6 (six) hours as needed (use for muscle cramps/pain). 09/07/19  Yes Maczis, Barth Kirks, PA-C  Multiple Vitamin (MULTIVITAMIN WITH MINERALS) TABS tablet Take 1 tablet by mouth 2 (two) times daily.    Yes [provider]  nicotine (NICODERM CQ - DOSED IN MG/24 HOURS) 21 mg/24hr patch Place 21 mg onto the skin daily.   Yes [provider]  nystatin (MYCOSTATIN) 100000 UNIT/ML suspension Take 5 mLs (500,000 Units total) by mouth 4 (four) times daily. 12/08/19  Yes Truitt Merle, MD  ondansetron (ZOFRAN) 8 MG tablet Take 1 tablet (8 mg total) by mouth 2 (two) times daily as needed (Nausea or vomiting). 09/26/19  Yes Truitt Merle, MD  OVER THE COUNTER MEDICATION Take 1 each by mouth 2 (two) times daily as needed (anxiety / sleep). CBD Gummies   Yes [provider]  oxyCODONE (OXY IR/ROXICODONE) 5 MG immediate release tablet Take 1 tablet (5 mg total) by mouth every 6 (six) hours as needed for breakthrough pain. 09/07/19  Yes Maczis, Barth Kirks, PA-C  pantoprazole (PROTONIX) 40 MG tablet Take 1 tablet (40 mg total) by mouth daily. 12/08/19  Yes Truitt Merle, MD  prochlorperazine (COMPAZINE) 10 MG tablet Take 1 tablet (10 mg total) by mouth every 6 (six) hours as needed (Nausea or vomiting). 09/26/19  Yes Truitt Merle, MD  Zinc Sulfate 220 (50 Zn) MG TABS TAKE 1 TABLET (220 MG TOTAL) BY MOUTH IN THE MORNING AND AT BEDTIME. 12/02/19 01/01/20 Yes Dillingham, Loel Lofty, DO     Positive ROS: Otherwise negative  All other  systems have been reviewed and were otherwise negative with the exception of those mentioned in the HPI and as above.  Physical Exam: Constitutional: Alert, well-appearing, no acute distress Ears: External ears without lesions or tenderness. Ear canals are clear bilaterally with intact, clear TMs bilaterally.  On tuning fork testing with the 512 tuning fork Weber was midline and AC > BC bilaterally. Nasal: External nose without lesions.. Clear nasal passages Oral: Lips and gums without lesions. Tongue and palate mucosa without lesions. Posterior oropharynx clear. Neck: No palpable adenopathy or masses Respiratory: Breathing comfortably  Skin: No facial/neck lesions or rash noted.  Audiogram demonstrated a mild to moderate downsloping sensorineural hearing loss in both ears slightly worse on the left side.  SRT's were 30 dB on the right and 40 dB on the left.  She had type A tympanograms bilaterally.  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  Plan: Reviewed with patient concerning her hearing test.  Discussed with her that she would be a candidate for hearing aids. Also discussed with her concerning minimal treatment options for tinnitus.  Reviewed with her concerning using masking noise when the tinnitus is bothersome. Gave her a copy of the hearing test so that she can be evaluated for obtaining hearing aids by the audiologist of her choosing.  Radene Journey, MD

## 2019-12-17 NOTE — Telephone Encounter (Signed)
No 9/20 los.  

## 2019-12-18 ENCOUNTER — Telehealth: Payer: Self-pay | Admitting: *Deleted

## 2019-12-18 ENCOUNTER — Telehealth (INDEPENDENT_AMBULATORY_CARE_PROVIDER_SITE_OTHER): Payer: Self-pay

## 2019-12-18 NOTE — Telephone Encounter (Signed)
Pt called informing ADA forms dropped off to be filled out. Informed pt that [facility] RN has received her ADA forms. Pt relate saw Dr. Lucia Gaskins- ENT for sore throat and ears and was informed that she did have hearing loss and hearing aides were recommended. Pt request Dr. Burr Medico thoughts on symptoms of ear ache and sore throat since starting treatment. Msg sent to Dr. Burr Medico. No further needs voiced at this time.

## 2019-12-18 NOTE — Telephone Encounter (Signed)
Call placed to Dr. Pollie Friar office per Dr. Burr Medico. Dr. Burr Medico request call from Dr. Lucia Gaskins. Number provided for Dr .Lucia Gaskins to call

## 2019-12-19 ENCOUNTER — Encounter (INDEPENDENT_AMBULATORY_CARE_PROVIDER_SITE_OTHER): Payer: Self-pay | Admitting: Otolaryngology

## 2019-12-22 ENCOUNTER — Inpatient Hospital Stay: Payer: BC Managed Care – PPO

## 2019-12-22 ENCOUNTER — Other Ambulatory Visit: Payer: Self-pay

## 2019-12-22 VITALS — BP 133/81 | HR 71 | Temp 98.3°F | Resp 16

## 2019-12-22 DIAGNOSIS — C50411 Malignant neoplasm of upper-outer quadrant of right female breast: Secondary | ICD-10-CM

## 2019-12-22 DIAGNOSIS — Z170 Estrogen receptor positive status [ER+]: Secondary | ICD-10-CM

## 2019-12-22 DIAGNOSIS — Z5112 Encounter for antineoplastic immunotherapy: Secondary | ICD-10-CM | POA: Diagnosis not present

## 2019-12-22 LAB — CBC WITH DIFFERENTIAL (CANCER CENTER ONLY)
Abs Immature Granulocytes: 0.09 10*3/uL — ABNORMAL HIGH (ref 0.00–0.07)
Basophils Absolute: 0.1 10*3/uL (ref 0.0–0.1)
Basophils Relative: 1 %
Eosinophils Absolute: 0.1 10*3/uL (ref 0.0–0.5)
Eosinophils Relative: 1 %
HCT: 37.1 % (ref 36.0–46.0)
Hemoglobin: 12.6 g/dL (ref 12.0–15.0)
Immature Granulocytes: 1 %
Lymphocytes Relative: 18 %
Lymphs Abs: 1.9 10*3/uL (ref 0.7–4.0)
MCH: 29.2 pg (ref 26.0–34.0)
MCHC: 34 g/dL (ref 30.0–36.0)
MCV: 86.1 fL (ref 80.0–100.0)
Monocytes Absolute: 0.8 10*3/uL (ref 0.1–1.0)
Monocytes Relative: 7 %
Neutro Abs: 7.6 10*3/uL (ref 1.7–7.7)
Neutrophils Relative %: 72 %
Platelet Count: 294 10*3/uL (ref 150–400)
RBC: 4.31 MIL/uL (ref 3.87–5.11)
RDW: 14.2 % (ref 11.5–15.5)
WBC Count: 10.5 10*3/uL (ref 4.0–10.5)
nRBC: 0 % (ref 0.0–0.2)

## 2019-12-22 LAB — CMP (CANCER CENTER ONLY)
ALT: 37 U/L (ref 0–44)
AST: 30 U/L (ref 15–41)
Albumin: 3.8 g/dL (ref 3.5–5.0)
Alkaline Phosphatase: 97 U/L (ref 38–126)
Anion gap: 9 (ref 5–15)
BUN: 11 mg/dL (ref 6–20)
CO2: 23 mmol/L (ref 22–32)
Calcium: 10 mg/dL (ref 8.9–10.3)
Chloride: 105 mmol/L (ref 98–111)
Creatinine: 0.69 mg/dL (ref 0.44–1.00)
GFR, Est AFR Am: >60 mL/min (ref >60)
GFR, Estimated: >60 mL/min (ref >60)
Glucose, Bld: 82 mg/dL (ref 70–99)
Potassium: 4.2 mmol/L (ref 3.5–5.1)
Sodium: 137 mmol/L (ref 135–145)
Total Bilirubin: 0.4 mg/dL (ref 0.3–1.2)
Total Protein: 7.2 g/dL (ref 6.5–8.1)

## 2019-12-22 MED ORDER — SODIUM CHLORIDE 0.9 % IV SOLN
Freq: Once | INTRAVENOUS | Status: AC
  Filled 2019-12-22: qty 250

## 2019-12-22 MED ORDER — SODIUM CHLORIDE 0.9 % IV SOLN
10.0000 mg | Freq: Once | INTRAVENOUS | Status: AC
  Administered 2019-12-22: 10 mg via INTRAVENOUS
  Filled 2019-12-22: qty 10

## 2019-12-22 MED ORDER — SODIUM CHLORIDE 0.9% FLUSH
10.0000 mL | INTRAVENOUS | Status: DC | PRN
  Administered 2019-12-22: 10 mL
  Filled 2019-12-22: qty 10

## 2019-12-22 MED ORDER — HEPARIN SOD (PORK) LOCK FLUSH 100 UNIT/ML IV SOLN
500.0000 [IU] | Freq: Once | INTRAVENOUS | Status: AC | PRN
  Administered 2019-12-22: 500 [IU]
  Filled 2019-12-22: qty 5

## 2019-12-22 MED ORDER — DIPHENHYDRAMINE HCL 50 MG/ML IJ SOLN
INTRAMUSCULAR | Status: AC
  Filled 2019-12-22: qty 1

## 2019-12-22 MED ORDER — SODIUM CHLORIDE 0.9 % IV SOLN
40.0000 mg | Freq: Once | INTRAVENOUS | Status: AC
  Administered 2019-12-22: 40 mg via INTRAVENOUS
  Filled 2019-12-22: qty 4

## 2019-12-22 MED ORDER — SODIUM CHLORIDE 0.9 % IV SOLN
80.0000 mg/m2 | Freq: Once | INTRAVENOUS | Status: AC
  Administered 2019-12-22: 144 mg via INTRAVENOUS
  Filled 2019-12-22: qty 24

## 2019-12-22 MED ORDER — FAMOTIDINE IN NACL 20-0.9 MG/50ML-% IV SOLN
INTRAVENOUS | Status: AC
  Filled 2019-12-22: qty 50

## 2019-12-22 MED ORDER — TRASTUZUMAB-QYYP CHEMO 420 MG IV SOLR
150.0000 mg | Freq: Once | INTRAVENOUS | Status: AC
  Administered 2019-12-22: 150 mg via INTRAVENOUS
  Filled 2019-12-22: qty 7.14

## 2019-12-22 MED ORDER — ACETAMINOPHEN 325 MG PO TABS
650.0000 mg | ORAL_TABLET | Freq: Once | ORAL | Status: AC
  Administered 2019-12-22: 650 mg via ORAL

## 2019-12-22 MED ORDER — DIPHENHYDRAMINE HCL 50 MG/ML IJ SOLN
25.0000 mg | Freq: Once | INTRAMUSCULAR | Status: AC
  Administered 2019-12-22: 25 mg via INTRAVENOUS

## 2019-12-22 MED ORDER — ACETAMINOPHEN 325 MG PO TABS
ORAL_TABLET | ORAL | Status: AC
  Filled 2019-12-22: qty 2

## 2019-12-22 NOTE — Patient Instructions (Signed)

## 2019-12-22 NOTE — Patient Instructions (Signed)
[facility] Discharge Instructions for Patients Receiving Chemotherapy  Today you received the following chemotherapy agents trastuzumab, paclitaxel  To help prevent nausea and vomiting after your treatment, we encourage you to take your nausea medication as directed.   If you develop nausea and vomiting that is not controlled by your nausea medication, call the clinic.   BELOW ARE SYMPTOMS THAT SHOULD BE REPORTED IMMEDIATELY:  *FEVER GREATER THAN 100.5 F  *CHILLS WITH OR WITHOUT FEVER  NAUSEA AND VOMITING THAT IS NOT CONTROLLED WITH YOUR NAUSEA MEDICATION  *UNUSUAL SHORTNESS OF BREATH  *UNUSUAL BRUISING OR BLEEDING  TENDERNESS IN MOUTH AND THROAT WITH OR WITHOUT PRESENCE OF ULCERS  *URINARY PROBLEMS  *BOWEL PROBLEMS  UNUSUAL RASH Items with * indicate a potential emergency and should be followed up as soon as possible.  Feel free to call the clinic should you have any questions or concerns. The clinic phone number is [phone].  Please show the CHEMO ALERT CARD at check-in to the Emergency Department and triage nurse.   

## 2019-12-23 ENCOUNTER — Ambulatory Visit: Payer: BC Managed Care – PPO

## 2019-12-23 DIAGNOSIS — M25611 Stiffness of right shoulder, not elsewhere classified: Secondary | ICD-10-CM

## 2019-12-23 DIAGNOSIS — Z170 Estrogen receptor positive status [ER+]: Secondary | ICD-10-CM

## 2019-12-23 DIAGNOSIS — M25511 Pain in right shoulder: Secondary | ICD-10-CM

## 2019-12-23 DIAGNOSIS — C50411 Malignant neoplasm of upper-outer quadrant of right female breast: Secondary | ICD-10-CM | POA: Diagnosis not present

## 2019-12-23 DIAGNOSIS — M25512 Pain in left shoulder: Secondary | ICD-10-CM

## 2019-12-23 DIAGNOSIS — M25612 Stiffness of left shoulder, not elsewhere classified: Secondary | ICD-10-CM

## 2019-12-23 DIAGNOSIS — R293 Abnormal posture: Secondary | ICD-10-CM

## 2019-12-23 NOTE — Therapy (Signed)
[place], [place], [address] Phone: [phone]   Fax:  [phone]  Physical Therapy Treatment  Patient Details  Name: Stephanie Chase MRN: [id number] Date of Birth: May 23, 1960 Referring Provider (PT): Dr. Stark Klein   Encounter Date: 12/23/2019   PT End of Session - 12/23/19 1001    Visit Number 12    Number of Visits 22    Date for PT Re-Evaluation 01/13/20    PT Start Time 0908    PT Stop Time 1004    PT Time Calculation (min) 56 min    Equipment Utilized During Treatment Other (comment)    Activity Tolerance Patient tolerated treatment well    Behavior During Therapy [facility] for tasks assessed/performed           Past Medical History:  Diagnosis Date  . Anemia   . Anxiety   . Asthma   . Breast cancer ([place])   . COPD (chronic obstructive pulmonary disease) ([place])   . Discoid lupus   . Family history of bladder cancer   . Family history of BRCA gene mutation   . Family history of breast cancer   . Family history of prostate cancer   . GERD (gastroesophageal reflux disease)   . Hypothyroidism   . Pneumonia   . Thyroid disease     Past Surgical History:  Procedure Laterality Date  . ABLATION ON ENDOMETRIOSIS    . APPLICATION OF A-CELL OF EXTREMITY Right 10/22/2019   Procedure: EXCISION OF RIGHT BREAST WOUND WITH PRIMARY CLOSURE;  Surgeon: [name] Going, DO;  Location: [place];  Service: Plastics;  Laterality: Right;  . BREAST LUMPECTOMY    . DEBRIDEMENT AND CLOSURE WOUND Right 10/22/2019   Procedure: Excision of right breast wound;  Surgeon: [name] Going, DO;  Location: [place];  Service: Plastics;  Laterality: Right;  . MASTECTOMY W/ SENTINEL NODE BIOPSY Bilateral 09/05/2019   Procedure: BILATERAL MASTECTOMY WITH RIGHT SENTINEL LYMPH NODE BIOPSY;  Surgeon: Stark Klein, MD;  Location: [facility];  Service: General;  Laterality: Bilateral;  COMBINED WITH REGIONAL FOR POST OP PAIN    . PORTACATH PLACEMENT Left 09/05/2019   Procedure: INSERTION PORT-A-CATH WITH ULTRASOUND GUIDANCE;  Surgeon: Stark Klein, MD;  Location: [facility];  Service: General;  Laterality: Left;  . TONSILLECTOMY    . TUBAL LIGATION    . WISDOM TOOTH EXTRACTION      There were no vitals filed for this visit.   Subjective Assessment - 12/23/19 0915    Subjective Pt. was able to rake twice over the weekend.  Also able to vacuume over the weekend without feeling limited. Sometimes feels a catch in the shoulder with reaching.  Did not do isometrics because she had already been so active.    Pertinent History Bil masectomy 09/05/19 with 3 lymph nodes removed and negative on the R and benign tisuse on the L. Patient was diagnosed on 06/09/2019 with right grade III invasive ductal carcinoma breast cancer. It mesures 1.3 cm in the upper outer quadrant with 1.6 cm of calcifications and DCIS. It is ER positive, PR negative, and HER2 positive with a Ki67 of 50%. She smokes about 1 pack/day.    Patient Stated Goals I want to get back to kayaking.    Currently in Pain? Yes    Pain Score 5     Pain Location Shoulder    Pain Orientation Right    Pain Descriptors / Indicators Other (Comment)   pinching  with reaching   Pain Type Chronic pain    Pain Onset More than a month ago    Pain Frequency Intermittent    Aggravating Factors  reaching    Pain Relieving Factors rest    Effect of Pain on Daily Activities No effect on daily activities;pushes through it.                             [place] Adult PT Treatment/Exercise - 12/23/19 0001      Manual Therapy   Soft tissue mobilization With coconut oil to Rt pectoralis insertion during P/ROM; left sidelying for Rt. lateal trunk work with triger point release.    Passive ROM In Supine to Rt shoulder into flexion, abduction and D2 to pts available end ROM; scapular depression thorugout for all motions which allowed less discomfort where she has a "catch"  and she was able to achieve full P/ROM today,                     PT Short Term Goals - 12/16/19 1005      PT SHORT TERM GOAL #1   Title Pt will be independent with HEP and Modified MLD for the anterior trunk within 2 weeks in order to demonstrate autonomy of care.    Baseline pt provided with HEP today; pt independent with this now-12/16/19    Status Achieved             PT Long Term Goals - 12/16/19 0855      PT LONG TERM GOAL #1   Title Pt will demonstrate 150 degrees bil shoulder flexion and abduction within 4 weeks to demonstrate return to pre-operative AROM.    Baseline R shoulder flexion: 110, abduction: 69 L shoulder flexion: 118, abduction:  77; approximate AA/ROM with pulleys Rt 120 and abduction 140 degrees, Lt 150 degrees flexion and abduction (forgot to measure but this is visibly much improved since last measured)-11/13/19; Rt flex149, abd 152 with discomfort and pulling reported at top of shoulder and Lt flex 154, abd 170 - 12/16/19    Status Partially Met      PT LONG TERM GOAL #2   Title Pt will report 75% improvement in functional mobility and pain in order to demonstrate an improvement in subjective quality of life.    Baseline 2/10 pain, decresaed ROM below shoulder height. MOdifications with bathing/dressing; pt reports 50% improvement at this time as she is still limited with vaccuuming, lifting and reaching above head-11/13/19; pt reports 75-80% improvement at this time-12/16/19    Status Achieved      PT LONG TERM GOAL #3   Title Pt to be independent with HEP for improved A/ROM for bil shoulders and bil UE and postural strength to improve ease with ADLs.    Baseline No current HEP though began this today as she now has clearance for ROM from surgeon-11/13/19; pt is independenet with HEP thus far and progressed this today-12/16/19    Status On-going                 Plan - 12/23/19 1003    Clinical Impression Statement Continued with manual therapy  and after trigger point release was able to attain full PROM and had decrease in pinching sensation at Rt superior shoulder/GH joint. Pt will benefit from continuing PT to work towards attaing full painfree, end AROM. Pt reports having done supine scapular series but without theraband for  now as she increased her activity over the weekend with raking and vaccuuming, so didn't want to overdo with increased resistance as she has been instructed. She reports tolerated the exercises well without resistance, but will add this before returning next week.    Personal Factors and Comorbidities Comorbidity 1    Comorbidities bil masectomy with 3 lymph node removal on the R    Stability/Clinical Decision Making Stable/Uncomplicated    Rehab Potential Good    PT Frequency 1x / week    PT Duration 4 weeks    PT Treatment/Interventions Therapeutic activities;Therapeutic exercise;Neuromuscular re-education;Manual techniques;Cryotherapy;Moist Heat    PT Next Visit Plan Reassess new HEP and progress as tolerated. Review supine scapular series/did she get to try with theraband? Cont manual therapy working to improve end Rt>Lt shoulder ROM and decrease Rt pectoralis tightness.    PT Home Exercise Plan Standing dowel exercises, supine scapular series; bil shoulder isometrics    Consulted and Agree with Plan of Care Patient           Patient will benefit from skilled therapeutic intervention in order to improve the following deficits and impairments:  Decreased knowledge of precautions, Pain, Decreased range of motion, Increased edema  Visit Diagnosis: Malignant neoplasm of upper-outer quadrant of right breast in female, estrogen receptor positive (HCC)  Stiffness of left shoulder, not elsewhere classified  Stiffness of right shoulder, not elsewhere classified  Acute pain of right shoulder  Acute pain of left shoulder  Abnormal posture     Problem List Patient Active Problem List   Diagnosis Date  Noted  . Acquired absence of breast and absent nipple, bilateral 10/07/2019  . Breast wound, right, initial encounter 10/07/2019  . Breast cancer of upper-outer quadrant of right female breast ([place]) 09/05/2019  . Genetic testing 08/12/2019  . Family history of BRCA gene mutation 07/31/2019  . Family history of breast cancer   . Family history of prostate cancer   . Family history of bladder cancer   . Malignant neoplasm of upper-outer quadrant of right breast in female, estrogen receptor positive ([place]) 07/24/2019  . Annual physical exam 03/31/2016  . Occupational bronchitis ([place]) 03/03/2015  . Hyperlipidemia 03/30/2014  . 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 Generalized osteoarthritis 02/23/2012  . Plantar fasciitis, bilateral 02/23/2012  . Vitiligo 02/23/2012  . Difficulty hearing 02/13/2012  . History of lupus 12/21/2011  . Vitamin D deficiency 10/11/2010  . Hypothyroidism 06/21/2010  . Systemic lupus erythematosus ([place]) 06/14/2010    Otelia Limes, PTA 12/23/2019, 10:53 AM  [place], [place], [address] Phone: [phone]   Fax:  [phone]  Name: Stephanie Chase MRN: [id number] Date of Birth: 07-Sep-1960

## 2019-12-26 ENCOUNTER — Other Ambulatory Visit: Payer: Self-pay | Admitting: Hematology

## 2019-12-26 ENCOUNTER — Other Ambulatory Visit: Payer: Self-pay | Admitting: Plastic Surgery

## 2019-12-26 DIAGNOSIS — C50411 Malignant neoplasm of upper-outer quadrant of right female breast: Secondary | ICD-10-CM

## 2019-12-29 ENCOUNTER — Encounter: Payer: Self-pay | Admitting: Radiology

## 2019-12-29 ENCOUNTER — Inpatient Hospital Stay: Payer: BC Managed Care – PPO

## 2019-12-29 ENCOUNTER — Encounter: Payer: Self-pay | Admitting: Hematology

## 2019-12-29 ENCOUNTER — Inpatient Hospital Stay: Payer: BC Managed Care – PPO | Attending: Hematology | Admitting: Hematology

## 2019-12-29 ENCOUNTER — Other Ambulatory Visit: Payer: Self-pay

## 2019-12-29 VITALS — BP 117/81 | HR 69 | Temp 98.1°F | Resp 16

## 2019-12-29 DIAGNOSIS — Z5112 Encounter for antineoplastic immunotherapy: Secondary | ICD-10-CM | POA: Insufficient documentation

## 2019-12-29 DIAGNOSIS — C50411 Malignant neoplasm of upper-outer quadrant of right female breast: Secondary | ICD-10-CM | POA: Insufficient documentation

## 2019-12-29 DIAGNOSIS — H9319 Tinnitus, unspecified ear: Secondary | ICD-10-CM | POA: Insufficient documentation

## 2019-12-29 DIAGNOSIS — G620 Drug-induced polyneuropathy: Secondary | ICD-10-CM | POA: Insufficient documentation

## 2019-12-29 DIAGNOSIS — Z170 Estrogen receptor positive status [ER+]: Secondary | ICD-10-CM

## 2019-12-29 DIAGNOSIS — Z5111 Encounter for antineoplastic chemotherapy: Secondary | ICD-10-CM | POA: Diagnosis present

## 2019-12-29 LAB — CMP (CANCER CENTER ONLY)
ALT: 29 U/L (ref 0–44)
AST: 29 U/L (ref 15–41)
Albumin: 3.8 g/dL (ref 3.5–5.0)
Alkaline Phosphatase: 95 U/L (ref 38–126)
Anion gap: 5 (ref 5–15)
BUN: 9 mg/dL (ref 6–20)
CO2: 27 mmol/L (ref 22–32)
Calcium: 10 mg/dL (ref 8.9–10.3)
Chloride: 107 mmol/L (ref 98–111)
Creatinine: 0.86 mg/dL (ref 0.44–1.00)
GFR, Est AFR Am: >60 mL/min (ref >60)
GFR, Estimated: >60 mL/min (ref >60)
Glucose, Bld: 83 mg/dL (ref 70–99)
Potassium: 4.1 mmol/L (ref 3.5–5.1)
Sodium: 139 mmol/L (ref 135–145)
Total Bilirubin: 0.4 mg/dL (ref 0.3–1.2)
Total Protein: 7.1 g/dL (ref 6.5–8.1)

## 2019-12-29 LAB — CBC WITH DIFFERENTIAL (CANCER CENTER ONLY)
Abs Immature Granulocytes: 0.07 10*3/uL (ref 0.00–0.07)
Basophils Absolute: 0.1 10*3/uL (ref 0.0–0.1)
Basophils Relative: 1 %
Eosinophils Absolute: 0.1 10*3/uL (ref 0.0–0.5)
Eosinophils Relative: 1 %
HCT: 36.4 % (ref 36.0–46.0)
Hemoglobin: 12.2 g/dL (ref 12.0–15.0)
Immature Granulocytes: 1 %
Lymphocytes Relative: 25 %
Lymphs Abs: 1.9 10*3/uL (ref 0.7–4.0)
MCH: 29 pg (ref 26.0–34.0)
MCHC: 33.5 g/dL (ref 30.0–36.0)
MCV: 86.7 fL (ref 80.0–100.0)
Monocytes Absolute: 0.6 10*3/uL (ref 0.1–1.0)
Monocytes Relative: 8 %
Neutro Abs: 5.1 10*3/uL (ref 1.7–7.7)
Neutrophils Relative %: 64 %
Platelet Count: 297 10*3/uL (ref 150–400)
RBC: 4.2 MIL/uL (ref 3.87–5.11)
RDW: 14.9 % (ref 11.5–15.5)
WBC Count: 7.9 10*3/uL (ref 4.0–10.5)
nRBC: 0 % (ref 0.0–0.2)

## 2019-12-29 MED ORDER — SODIUM CHLORIDE 0.9% FLUSH
10.0000 mL | INTRAVENOUS | Status: DC | PRN
  Administered 2019-12-29: 10 mL
  Filled 2019-12-29: qty 10

## 2019-12-29 MED ORDER — TRASTUZUMAB-QYYP CHEMO 420 MG IV SOLR
150.0000 mg | Freq: Once | INTRAVENOUS | Status: AC
  Administered 2019-12-29: 150 mg via INTRAVENOUS
  Filled 2019-12-29: qty 7.14

## 2019-12-29 MED ORDER — HEPARIN SOD (PORK) LOCK FLUSH 100 UNIT/ML IV SOLN
500.0000 [IU] | Freq: Once | INTRAVENOUS | Status: AC | PRN
  Administered 2019-12-29: 500 [IU]
  Filled 2019-12-29: qty 5

## 2019-12-29 MED ORDER — SODIUM CHLORIDE 0.9 % IV SOLN
80.0000 mg/m2 | Freq: Once | INTRAVENOUS | Status: AC
  Administered 2019-12-29: 144 mg via INTRAVENOUS
  Filled 2019-12-29: qty 24

## 2019-12-29 MED ORDER — FAMOTIDINE IN NACL 20-0.9 MG/50ML-% IV SOLN
INTRAVENOUS | Status: AC
  Filled 2019-12-29: qty 50

## 2019-12-29 MED ORDER — ACETAMINOPHEN 325 MG PO TABS
650.0000 mg | ORAL_TABLET | Freq: Once | ORAL | Status: AC
  Administered 2019-12-29: 650 mg via ORAL

## 2019-12-29 MED ORDER — SODIUM CHLORIDE 0.9 % IV SOLN
40.0000 mg | Freq: Once | INTRAVENOUS | Status: AC
  Administered 2019-12-29: 40 mg via INTRAVENOUS
  Filled 2019-12-29: qty 4

## 2019-12-29 MED ORDER — SODIUM CHLORIDE 0.9 % IV SOLN
10.0000 mg | Freq: Once | INTRAVENOUS | Status: AC
  Administered 2019-12-29: 10 mg via INTRAVENOUS
  Filled 2019-12-29: qty 10

## 2019-12-29 MED ORDER — SODIUM CHLORIDE 0.9 % IV SOLN
Freq: Once | INTRAVENOUS | Status: AC
  Filled 2019-12-29: qty 250

## 2019-12-29 MED ORDER — DIPHENHYDRAMINE HCL 50 MG/ML IJ SOLN
25.0000 mg | Freq: Once | INTRAMUSCULAR | Status: AC
  Administered 2019-12-29: 25 mg via INTRAVENOUS

## 2019-12-29 MED ORDER — ACETAMINOPHEN 325 MG PO TABS
ORAL_TABLET | ORAL | Status: AC
  Filled 2019-12-29: qty 2

## 2019-12-29 MED ORDER — SODIUM CHLORIDE 0.9 % IV SOLN
40.0000 mg | Freq: Once | INTRAVENOUS | Status: DC
  Filled 2019-12-29: qty 4

## 2019-12-29 MED ORDER — DIPHENHYDRAMINE HCL 50 MG/ML IJ SOLN
INTRAMUSCULAR | Status: AC
  Filled 2019-12-29: qty 1

## 2019-12-29 NOTE — Research (Signed)
[id number], A PROSPECTIVE OBSERVATIONAL COHORT STUDY TO DEVELOP A PREDICTIVE MODEL OFTAXANE-INDUCED PERIPHERAL NEUROPATHY IN CANCER PATIENTS.  12/29/2019      1:00PM  TAXANE BLOOD COLLECTION: Blood was collected via peripheral stick of contralateral arm at 1305 by Rodney Langton, RN. Taxane ended at 1307. There were no adverse events and blood was placed directly on ice just after collection. I walked specimen to the lab for processing. Stephanie Chase was thanked for her time and I look forward to seeing her in a couple of weeks.   Carol Ada, RT(R)(T) Clinical Research Coordinator

## 2019-12-29 NOTE — Patient Instructions (Signed)

## 2019-12-29 NOTE — Telephone Encounter (Signed)
Rx was denied per Dr. Marla Roe.  Patient will need to contact PCP if she is wanting to continue on the medication.    Called and informed the patient that Rx was denied.  Asked the patient if she's is still needing to continue on this medication.  Patient stated that yes she is still taking this medication.    Informed her that per Dr. Marla Roe there is something about the dose of the medication that she feels the patient will need to get the prescription from her PCP.  Patient verbalized understanding and agreed.//AB/CMA

## 2019-12-29 NOTE — Progress Notes (Signed)
Stephanie Chase   [phone] [phone]   Clinic Follow up Note   Patient Care Team: Delsa Bern, MD as PCP - General (Obstetrics and Gynecology) Richrd Prime as Consulting Physician (Obstetrics and Gynecology) Mauro Kaufmann, RN as Oncology Nurse Navigator Rockwell Germany, RN as Oncology Nurse Navigator Stark Klein, MD as Consulting Physician (General Surgery) Truitt Merle, MD as Consulting Physician (Hematology) Kyung Rudd, MD as Consulting Physician (Radiation Oncology) Register, Luetta Nutting, PA-C as Physician Assistant (Dermatology) [name] Going, DO as Attending Physician (Plastic Surgery)  Date of Service:  12/29/2019  CHIEF COMPLAINT: F/u of right breast cancer  SUMMARY OF ONCOLOGIC HISTORY: Oncology History Overview Note  Cancer Staging Malignant neoplasm of upper-outer quadrant of right breast in female, estrogen receptor positive ([place]) Staging form: Breast, AJCC 8th Edition - Clinical stage from 07/30/2019: Stage IA (cT1c, cN0, cM0, G3, ER+, PR-, HER2+) - Unsigned    Malignant neoplasm of upper-outer quadrant of right breast in female, estrogen receptor positive ([place])  07/03/2019 Mammogram   Diagnostic Mammogram  IMPRESSION There is a 1.3x1.1cm focal asymmetry in the retroareolar anterior to middle depth right breast 2.2cm from the nipple.  There is a 2 cm focal asymmetry with appearance in the upper outer quadrant posterior depth of right breast 3 cm from nipple -Both suspicious and biopsy recommended.     07/18/2019 Initial Biopsy   Diagnosis 07/18/19 1. Breast, right, needle core biopsy, 12 o'clock - INVASIVE DUCTAL CARCINOMA. 2. Breast, right, needle core biopsy, upper outer quadrant - DUCTAL CARCINOMA IN SITU. Microscopic Comment 1. The invasive carcinoma is nuclear grade 3. The greatest linear extent of tumor in any one core is 11 mm. Ancillary studies will be reported separately. CKAE1AE3, GATA-3, ER and E-cadherin are  positive. PAX 8 is negative. CK7 is noncontributory. 2. The in situ carcinoma is high nuclear grade with central necrosis and calcifications. E-cadherin is positive. P63, Calponin and SMM-1 demonstrate the present of myoepithelium.   07/18/2019 Receptors her2   1. PROGNOSTIC INDICATORS 07/18/19 Results: IMMUNOHISTOCHEMICAL AND MORPHOMETRIC ANALYSIS PERFORMED MANUALLY The tumor cells are POSITIVE for Her2 (3+). Of note, the tumor shows heterogeneity in regards to Her2 expression. Estrogen Receptor: 95%, POSITIVE, STRONG STAINING INTENSITY Progesterone Receptor: 0%, NEGATIVE Proliferation Marker Ki67: 50%   07/24/2019 Initial Diagnosis   Malignant neoplasm of upper-outer quadrant of right breast in female, estrogen receptor positive ([place])   08/01/2019 Breast MRI   IMPRESSION: 1. Known invasive ductal carcinoma measures 1.2 centimeters in the 12 o'clock location. 2. Non mass enhancement measures 2.3 centimeters in the LATERAL portion of the RIGHT breast corresponding to DCIS on biopsy. 3. An additional mass in the UPPER OUTER QUADRANT of the RIGHT breast is 1.5 centimeters and warrants tissue diagnosis. This area correlates with distortion seen mammographically. 4. No axillary adenopathy or ancillary findings. LEFT breast is negative.     08/04/2019 Echocardiogram   IMPRESSIONS     1. Left ventricular ejection fraction, by estimation, is 65 to 70%. The  left ventricle has normal function. The left ventricle has no regional  wall motion abnormalities. Left ventricular diastolic parameters were  normal. The average left ventricular  global longitudinal strain is -20.4 %.   2. Right ventricular systolic function is normal. The right ventricular  size is normal.   3. The mitral valve is normal in structure. No evidence of mitral valve  regurgitation. No evidence of mitral stenosis.   4. The aortic valve is normal in structure. Aortic valve regurgitation is  not visualized. No aortic  stenosis is present.    08/10/2019 Genetic Testing   Negative genetic testing:  No pathogenic variants detected on the [facility] Breast Cancer STAT panel + Common Hereditary Cancers panel. A variant of uncertain significance (VUS) was detected in the ATM gene called c.3834C>A. The report date is 08/10/2019.  The Breast Cancer STAT Panel offered by [facility] includes sequencing and deletion/duplication analysis for the following 9 genes:  ATM, BRCA1, BRCA2, CDH1, CHEK2, PALB2, PTEN, STK11 and TP53. The Common Hereditary Cancers Panel offered by [facility] includes sequencing and/or deletion duplication testing of the following 48 genes: APC, ATM, AXIN2, BARD1, BMPR1A, BRCA1, BRCA2, BRIP1, CDH1, CDK4, CDKN2A (p14ARF), CDKN2A (p16INK4a), CHEK2, CTNNA1, DICER1, EPCAM (Deletion/duplication testing only), GREM1 (promoter region deletion/duplication testing only), KIT, MEN1, MLH1, MSH2, MSH3, MSH6, MUTYH, NBN, NF1, NHTL1, PALB2, PDGFRA, PMS2, POLD1, POLE, PTEN, RAD50, RAD51C, RAD51D, RNF43, SDHB, SDHC, SDHD, SMAD4, SMARCA4. STK11, TP53, TSC1, TSC2, and VHL.  The following genes were evaluated for sequence changes only: SDHA and HOXB13 c.251G>A variant only.    09/05/2019 Surgery   BILATERAL MASTECTOMY WITH RIGHT SENTINEL LYMPH NODE BIOPSY and PAC placement by Dr Barry Dienes    09/05/2019 Pathology Results   FINAL MICROSCOPIC DIAGNOSIS:   A. BREAST, LEFT, MASTECTOMY:  - Benign breast tissue.   B. BREAST, RIGHT, MASTECTOMY:  - Invasive ductal carcinoma, multifocal, 1.4 cm in greatest dimension,  [place] grade 3 of 3.  - Ductal carcinoma in situ, high nuclear grade with central necrosis and  calcifications.  - Margins of resection are not involved.  - Biopsy sites.  - See oncology table.   C. SENTINEL LYMPH NODE, RIGHT AXILLARY #1, BIOPSY:  - One lymph node, negative for carcinoma (0/1).   D. SENTINEL LYMPH NODE, RIGHT AXILLARY #2, BIOPSY:  - One lymph node, negative for carcinoma (0/1).   E. SENTINEL LYMPH  NODE, RIGHT AXILLARY #3, BIOPSY:  - One lymph node, negative for carcinoma (0/1).     Estrogen Receptor: 95%, positive, strong             Progesterone Receptor: 0%, negative             HER2: Positive (3+)             Ki-67: 50%    09/05/2019 Cancer Staging   Staging form: Breast, AJCC 8th Edition - Pathologic stage from 09/05/2019: Stage IA (pT1c, pN0, cM0, G3, ER+, PR-, HER2+) - Signed by Gardenia Phlegm, NP on 09/17/2019   10/22/2019 Surgery   Excision of right breast wound and APPLICATION OF A-CELL, INTEGRA BWM OR CELLERATE by Dr Marla Roe on 10/22/19   10/27/2019 -  Chemotherapy   Weekly Taxol and Herceptin for 12 weeks starting 10/27/19 then maintenance Herceptin q3weeks to complete 1 year treatment. Given recent right breast infection and debridement, will start Herceptin on 10/27/19 with loading dose (q2weeks) and add Taxol with week 3 on 11/10/19 then continuing with both weekly.  -Due to tinnitus, her Kanjinti (Herceptin) was changed to weekly Trazimera on 11/24/19.       CURRENT THERAPY:  Weekly Taxol and Trazimerafor12 weeksstarting 8/2/21then maintenance Trazimeraq3weeksto complete 1 yeartreatment.Taxol started on 11/10/19.   INTERVAL HISTORY:  Stephanie Chase is here for a follow up and treatment. He presents to the clinic alone. She notes she has been having a bad day. She was seen by ENT recently. She still has tinnitus and the pain in the right ear the ENT did not address. ENT did say she needed a hearing  aid. She notes she has trouble remembering things lately including her ENT visit. She is also still having dry blood in her left nostril even with nasal saline and humidifier. She is having a lot of joint pain. She notes having joint pain and not sure if it is her stressing herself out. She has not been taking naps. She is working from home mostly. She has been using Tylenol for pain. She notes she still has blood with BM occasionally. She notes she has jury duty on  01/27/20 and needs letter to be released from it. She notes still having right axillary pain. She has started PT. She notes prickly feeling of her scalp which improves after hair sheds more. She denies neuropathy in her hands and feet overall.    REVIEW OF SYSTEMS:   Constitutional: Denies fevers, chills or abnormal weight loss Eyes: Denies blurriness of vision Ears, nose, mouth, throat, and face: Denies mucositis or sore throat (+) Right ear pain (+) Dry blood in nose Respiratory: Denies cough, dyspnea or wheezes Cardiovascular: Denies palpitation, chest discomfort or lower extremity swelling Gastrointestinal:  Denies nausea, heartburn or change in bowel habits (+) Blood with BM occasionally.  Skin: Denies abnormal skin rashes MSK: (+) joint pain  Lymphatics: Denies new lymphadenopathy or easy bruising Neurological:Denies numbness, tingling or new weaknesses (+) Decreased memory (+) tingling of scalp Behavioral/Psych: Mood is stable, no new changes  All other systems were reviewed with the patient and are negative.  MEDICAL HISTORY:  Past Medical History:  Diagnosis Date  . Anemia   . Anxiety   . Asthma   . Breast cancer ([place])   . COPD (chronic obstructive pulmonary disease) ([place])   . Discoid lupus   . Family history of bladder cancer   . Family history of BRCA gene mutation   . Family history of breast cancer   . Family history of prostate cancer   . GERD (gastroesophageal reflux disease)   . Hypothyroidism   . Pneumonia   . Thyroid disease     SURGICAL HISTORY: Past Surgical History:  Procedure Laterality Date  . ABLATION ON ENDOMETRIOSIS    . APPLICATION OF A-CELL OF EXTREMITY Right 10/22/2019   Procedure: EXCISION OF RIGHT BREAST WOUND WITH PRIMARY CLOSURE;  Surgeon: [name] Going, DO;  Location: [facility];  Service: Plastics;  Laterality: Right;  . BREAST LUMPECTOMY    . DEBRIDEMENT AND CLOSURE WOUND Right 10/22/2019   Procedure: Excision of right breast wound;   Surgeon: [name] Going, DO;  Location: [facility];  Service: Plastics;  Laterality: Right;  . MASTECTOMY W/ SENTINEL NODE BIOPSY Bilateral 09/05/2019   Procedure: BILATERAL MASTECTOMY WITH RIGHT SENTINEL LYMPH NODE BIOPSY;  Surgeon: Stark Klein, MD;  Location: [facility];  Service: General;  Laterality: Bilateral;  COMBINED WITH REGIONAL FOR POST OP PAIN  . PORTACATH PLACEMENT Left 09/05/2019   Procedure: INSERTION PORT-A-CATH WITH ULTRASOUND GUIDANCE;  Surgeon: Stark Klein, MD;  Location: [facility];  Service: General;  Laterality: Left;  . TONSILLECTOMY    . TUBAL LIGATION    . WISDOM TOOTH EXTRACTION      I have reviewed the social history and family history with the patient and they are unchanged from previous note.  ALLERGIES:  is allergic to bactrim [sulfamethoxazole-trimethoprim].  MEDICATIONS:  Current Outpatient Medications  Medication Sig Dispense Refill  . acetaminophen (TYLENOL) 500 MG tablet Take 500-1,000 mg by mouth every 6 (six) hours as needed for moderate pain.     . Albuterol Sulfate (  PROAIR RESPICLICK) 185 (90 Base) MCG/ACT AEPB Inhale 2 puffs into the lungs every 6 (six) hours as needed (SOB). 1 each 0  . ALPRAZolam (XANAX) 0.25 MG tablet Take 1 tablet (0.25 mg total) by mouth daily as needed for anxiety. 30 tablet 0  . Ascorbic Acid (VITAMIN C) 1000 MG tablet Take 1,000 mg by mouth daily.    . cetirizine (ZYRTEC) 10 MG tablet Take 10 mg by mouth daily as needed for allergies.     . Cholecalciferol (VITAMIN D) 50 MCG (2000 UT) tablet Take 2,000 Units by mouth daily.    Marland Kitchen dextromethorphan (DELSYM) 30 MG/5ML liquid Take 60 mg by mouth 2 (two) times daily as needed for cough.     . fluticasone (FLONASE) 50 MCG/ACT nasal spray Place 1 spray into both nostrils daily as needed for allergies.     . fluticasone (FLONASE) 50 MCG/ACT nasal spray Place 2 sprays into both nostrils in the morning and at bedtime. 16 g 5  . levothyroxine (SYNTHROID) 75 MCG tablet Take 75 mcg by mouth daily  before breakfast.    . lidocaine-prilocaine (EMLA) cream Apply 1 application topically as needed. (Patient taking differently: Apply 1 application topically daily as needed (port access). ) 30 g 1  . liothyronine (CYTOMEL) 5 MCG tablet TAKE 2 TABLETS (=10MCG     TOTAL)     DAILY (Patient taking differently: Take 10 mcg by mouth daily. ) 180 tablet 1  . methocarbamol (ROBAXIN) 500 MG tablet Take 1 tablet (500 mg total) by mouth every 6 (six) hours as needed (use for muscle cramps/pain). 20 tablet 1  . Multiple Vitamin (MULTIVITAMIN WITH MINERALS) TABS tablet Take 1 tablet by mouth 2 (two) times daily.     . nicotine (NICODERM CQ - DOSED IN MG/24 HOURS) 21 mg/24hr patch Place 21 mg onto the skin daily.    Marland Kitchen nystatin (MYCOSTATIN) 100000 UNIT/ML suspension Take 5 mLs (500,000 Units total) by mouth 4 (four) times daily. 473 mL 1  . ondansetron (ZOFRAN) 8 MG tablet Take 1 tablet (8 mg total) by mouth 2 (two) times daily as needed (Nausea or vomiting). 30 tablet 1  . OVER THE COUNTER MEDICATION Take 1 each by mouth 2 (two) times daily as needed (anxiety / sleep). CBD Gummies    . oxyCODONE (OXY IR/ROXICODONE) 5 MG immediate release tablet Take 1 tablet (5 mg total) by mouth every 6 (six) hours as needed for breakthrough pain. (Patient not taking: Reported on 12/23/2019) 20 tablet 0  . pantoprazole (PROTONIX) 40 MG tablet Take 1 tablet (40 mg total) by mouth daily. 30 tablet 2  . prochlorperazine (COMPAZINE) 10 MG tablet Take 1 tablet (10 mg total) by mouth every 6 (six) hours as needed (Nausea or vomiting). 30 tablet 1  . Zinc Sulfate 220 (50 Zn) MG TABS TAKE 1 TABLET (220 MG TOTAL) BY MOUTH IN THE MORNING AND AT BEDTIME. 60 tablet 0   No current facility-administered medications for this visit.   Facility-Administered Medications Ordered in Other Visits  Medication Dose Route Frequency Provider Last Rate Last Admin  . alum & mag hydroxide-simeth (MAALOX/MYLANTA) 200-200-20 MG/5ML suspension 30 mL  30 mL  Oral Once Harle Stanford., PA-C      . heparin lock flush 100 unit/mL  500 Units Intracatheter Once PRN Truitt Merle, MD      . PACLitaxel (TAXOL) 144 mg in sodium chloride 0.9 % 250 mL chemo infusion (</= 57m/m2)  80 mg/m2 (Treatment Plan Recorded) Intravenous Once FTruitt Merle  MD 274 mL/hr at 12/29/19 1209 144 mg at 12/29/19 1209  . sodium chloride flush (NS) 0.9 % injection 10 mL  10 mL Intracatheter PRN Truitt Merle, MD        PHYSICAL EXAMINATION: ECOG PERFORMANCE STATUS: 2 - Symptomatic, <50% confined to bed Blood pressure 117/81, heart rate 69 temperature 36.7, pulse ox 98% on room air GENERAL:alert, no distress and comfortable SKIN: skin color, texture, turgor are normal, no rashes or significant lesions except rashes on her face  EYES: normal, Conjunctiva are pink and non-injected, sclera clear  Musculoskeletal:no cyanosis of digits and no clubbing  NEURO: alert & oriented x 3 with fluent speech, no focal motor/sensory deficits BREAST: S/p bilateral mastectomy: Surgical incision healed well. No palpable nodules   LABORATORY DATA:  I have reviewed the data as listed CBC Latest Ref Rng & Units 12/29/2019 12/22/2019 12/15/2019  WBC 4.0 - 10.5 K/uL 7.9 10.5 7.7  Hemoglobin 12.0 - 15.0 g/dL 12.2 12.6 12.5  Hematocrit 36 - 46 % 36.4 37.1 37.5  Platelets 150 - 400 K/uL 297 294 281     CMP Latest Ref Rng & Units 12/29/2019 12/22/2019 12/15/2019  Glucose 70 - 99 mg/dL 83 82 83  BUN 6 - 20 mg/dL _0 Creatinine 0.44 - 1.00 mg/dL 0.86 0.69 0.75  Sodium 135 - 145 mmol/L 139 137 137  Potassium 3.5 - 5.1 mmol/L 4.1 4.2 4.3  Chloride 98 - 111 mmol/L 107 105 103  CO2 22 - 32 mmol/L _1 Calcium 8.9 - 10.3 mg/dL 10.0 10.0 10.1  Total Protein 6.5 - 8.1 g/dL 7.1 7.2 7.0  Total Bilirubin 0.3 - 1.2 mg/dL 0.4 0.4 0.3  Alkaline Phos 38 - 126 U/L 95 97 123  AST 15 - 41 U/L 29 30 41  ALT 0 - 44 U/L 29 37 68(H)      RADIOGRAPHIC STUDIES: I have personally reviewed the radiological images as  listed and agreed with the findings in the report. No results found.   ASSESSMENT & PLAN:  Roisin Mones is a 59 y.o. female with    1.Malignant neoplasm of upper-outer quadrant of right breast,invasive ductal carcinoma and DCIS,StageIA, pT1cN0M0,ER+/HER2+, PR-,Grade III -She was diagnosed in 06/2019.Given multifocal disease, she underwent right mastectomy and SLNB by Dr Barry Dienes on 09/05/19.Path showedmultifocal disease in right breast, largest 1.4cm, which were found to be high grade invasive ductal carcinoma with components of DCIS. -Given her HER2 positive high grade disease,I started her on adjuvant chemo with weekly paclitaxel and Herceptinfor 12 weeksstarting 10/27/19 (taxol started on 11/10/19), followed by maintenance Herceptin q3weeks to complete 1 year treatment. The goal of chemo is curative. -Due to tinnitus, her Kanjinti (Herceptin) was changed to [place] on 11/24/19.  -S/p week 6 she is having joint pain and fatigue and mild tingling of scalp. She also notes decreased memory lately which has increased her stress. Will monitor.  -Labs reviewed and adequate to proceed with week 7 taxol and Trazimera today.  -She will continue PT for surgery related pain.  -no need postmastectomy radiation  -She can proceed with Colonoscopy screening 1-2 months after she completes chemo  -F/u in 2 weeks   2. Genetic Testingnegative for pathogenetic mutations -Testing results did showA variant of uncertain significance (VUS) was detected in the ATM gene called c.3834C>A. The report date is 08/10/2019.She can repeat testing in 5-10 years.  3. Smoking Cessation, COPD -She has been smoking since the 2nd grade. She has been able to cut downto5-15  cigarettes a day. Smoking cessation has been reviewed with her.  -Continue albuterol inhaler and Singulair and f/u with her PCP.   4. Comorbidities:DiscoidLupus, Vitiligo, Thyroid disease.  -She was diagnosed with Lupus 10 years ago. She  uses steroid cream as needed for her lupus rashes, some skin lesions of which have been removed. -She sees her dermatologist Dr Nevada Crane and their [place].   5. Social Support, Anxiety, Insomnia -She lives alone but has good support from her 2 daughters but they live out of town.  -Her daughter notes the patient has anxiety or panic attacks when she gets overwhelmed or disappointed. -On low doseXanaxPRN.  -Ipreviouslyoffered her the chance to speak with out SW, she declined.  6. Mild Transminitis  -Improved. Continue monitoring  7. Tinnitus and hearing loss  -Onset after 1-2 cycles of Kanjinti and was switched to [place]. This persists.  -Her 11/2019 ENT consult with Dr Lucia Gaskins explained she has hearing loss which is probably not related to chemo treatment and her tinnitus is related to her hear loss. He recommended hearing aid.  -Pt feels he did not address her right ear pain and does not return to him. I recommend she f/u with different doctor in the same clinic.  -She will continue humidifier and nasal spray for dried blood in nose.    PLAN: -Give letter to release her form 11/2 jury duty.  -Labs reviewed and adequate to proceed with week 7 Taxoland Trazimeratoday -Lab, flush, and Taxol andTrazimeraweeklyfor 4 more weeks  -F/u every 2 weeks    No problem-specific Assessment & Plan notes found for this encounter.   No orders of the defined types were placed in this encounter.  All questions were answered. The patient knows to call the clinic with any problems, questions or concerns. No barriers to learning was detected. The total time spent in the appointment was 30 minutes.     Truitt Merle, MD 12/29/2019   I, Joslyn Devon, am acting as scribe for Truitt Merle, MD.   I have reviewed the above documentation for accuracy and completeness, and I agree with the above.

## 2019-12-30 ENCOUNTER — Ambulatory Visit: Payer: BC Managed Care – PPO | Attending: General Surgery

## 2019-12-30 DIAGNOSIS — M25511 Pain in right shoulder: Secondary | ICD-10-CM | POA: Diagnosis present

## 2019-12-30 DIAGNOSIS — C50411 Malignant neoplasm of upper-outer quadrant of right female breast: Secondary | ICD-10-CM | POA: Insufficient documentation

## 2019-12-30 DIAGNOSIS — Z170 Estrogen receptor positive status [ER+]: Secondary | ICD-10-CM | POA: Insufficient documentation

## 2019-12-30 DIAGNOSIS — R293 Abnormal posture: Secondary | ICD-10-CM | POA: Diagnosis present

## 2019-12-30 DIAGNOSIS — M25611 Stiffness of right shoulder, not elsewhere classified: Secondary | ICD-10-CM | POA: Insufficient documentation

## 2019-12-30 DIAGNOSIS — M25612 Stiffness of left shoulder, not elsewhere classified: Secondary | ICD-10-CM | POA: Diagnosis present

## 2019-12-30 DIAGNOSIS — M25512 Pain in left shoulder: Secondary | ICD-10-CM | POA: Insufficient documentation

## 2019-12-30 NOTE — Therapy (Signed)
[place], [place], [address] Phone: [phone]   Fax:  [phone]  Physical Therapy Treatment  Patient Details  Name: Stephanie Chase MRN: [id number] Date of Birth: Feb 23, 1961 Referring Provider (PT): Dr. Stark Klein   Encounter Date: 12/30/2019   PT End of Session - 12/30/19 0924    Visit Number 13    Number of Visits 22    Date for PT Re-Evaluation 01/13/20    PT Start Time 0907    PT Stop Time 1004    PT Time Calculation (min) 57 min    Activity Tolerance Patient tolerated treatment well    Behavior During Therapy [facility] for tasks assessed/performed           Past Medical History:  Diagnosis Date  . Anemia   . Anxiety   . Asthma   . Breast cancer ([place])   . COPD (chronic obstructive pulmonary disease) ([place])   . Discoid lupus   . Family history of bladder cancer   . Family history of BRCA gene mutation   . Family history of breast cancer   . Family history of prostate cancer   . GERD (gastroesophageal reflux disease)   . Hypothyroidism   . Pneumonia   . Thyroid disease     Past Surgical History:  Procedure Laterality Date  . ABLATION ON ENDOMETRIOSIS    . APPLICATION OF A-CELL OF EXTREMITY Right 10/22/2019   Procedure: EXCISION OF RIGHT BREAST WOUND WITH PRIMARY CLOSURE;  Surgeon: [name] Going, DO;  Location: [place];  Service: Plastics;  Laterality: Right;  . BREAST LUMPECTOMY    . DEBRIDEMENT AND CLOSURE WOUND Right 10/22/2019   Procedure: Excision of right breast wound;  Surgeon: [name] Going, DO;  Location: [place];  Service: Plastics;  Laterality: Right;  . MASTECTOMY W/ SENTINEL NODE BIOPSY Bilateral 09/05/2019   Procedure: BILATERAL MASTECTOMY WITH RIGHT SENTINEL LYMPH NODE BIOPSY;  Surgeon: Stark Klein, MD;  Location: [place];  Service: General;  Laterality: Bilateral;  COMBINED WITH REGIONAL FOR POST OP PAIN  . PORTACATH PLACEMENT Left 09/05/2019   Procedure:  INSERTION PORT-A-CATH WITH ULTRASOUND GUIDANCE;  Surgeon: Stark Klein, MD;  Location: [place];  Service: General;  Laterality: Left;  . TONSILLECTOMY    . TUBAL LIGATION    . WISDOM TOOTH EXTRACTION      There were no vitals filed for this visit.   Subjective Assessment - 12/30/19 0910    Subjective My tightness is much improved. Just still have the same tightness that comes and goes at my Rt lateral trunk. I vaccuumed again this morning and that felt a ltite better than last time, I just feel a slight pull in my incisions.    Pertinent History Bil masectomy 09/05/19 with 3 lymph nodes removed and negative on the R and benign tisuse on the L. Patient was diagnosed on 06/09/2019 with right grade III invasive ductal carcinoma breast cancer. It mesures 1.3 cm in the upper outer quadrant with 1.6 cm of calcifications and DCIS. It is ER positive, PR negative, and HER2 positive with a Ki67 of 50%. She smokes about 1 pack/day.    Patient Stated Goals I want to get back to kayaking.    Currently in Pain? No/denies                             [facility] Adult PT Treatment/Exercise - 12/30/19 0001  Shoulder Exercises: Pulleys   Flexion 2 minutes    Flexion Limitations VCs for slow, controlled motion and to hold stretches at end motions    ABduction 2 minutes      Shoulder Exercises: Therapy Ball   Flexion Both;10 reps   forward lean into end of stretch   ABduction Right;Left;10 reps   same side lean into end fo stretch     Shoulder Exercises: Stretch   Corner Stretch 3 reps;20 seconds   in doorway   Other Shoulder Stretches Modified downward dog on wall 5x, 5 sec holds       Manual Therapy   Soft tissue mobilization With coconut oil to Rt pectoralis insertion during P/ROM; left sidelying for Rt. lateal trunk work with trigger point release, also to medial Rt scapular border for trigger point release    Passive ROM In Supine to Rt shoulder into flexion and abduction ; pt had no  c/o "catch" today                    PT Short Term Goals - 12/16/19 1005      PT SHORT TERM GOAL #1   Title Pt will be independent with HEP and Modified MLD for the anterior trunk within 2 weeks in order to demonstrate autonomy of care.    Baseline pt provided with HEP today; pt independent with this now-12/16/19    Status Achieved             PT Long Term Goals - 12/16/19 0855      PT LONG TERM GOAL #1   Title Pt will demonstrate 150 degrees bil shoulder flexion and abduction within 4 weeks to demonstrate return to pre-operative AROM.    Baseline R shoulder flexion: 110, abduction: 69 L shoulder flexion: 118, abduction:  77; approximate AA/ROM with pulleys Rt 120 and abduction 140 degrees, Lt 150 degrees flexion and abduction (forgot to measure but this is visibly much improved since last measured)-11/13/19; Rt flex149, abd 152 with discomfort and pulling reported at top of shoulder and Lt flex 154, abd 170 - 12/16/19    Status Partially Met      PT LONG TERM GOAL #2   Title Pt will report 75% improvement in functional mobility and pain in order to demonstrate an improvement in subjective quality of life.    Baseline 2/10 pain, decresaed ROM below shoulder height. MOdifications with bathing/dressing; pt reports 50% improvement at this time as she is still limited with vaccuuming, lifting and reaching above head-11/13/19; pt reports 75-80% improvement at this time-12/16/19    Status Achieved      PT LONG TERM GOAL #3   Title Pt to be independent with HEP for improved A/ROM for bil shoulders and bil UE and postural strength to improve ease with ADLs.    Baseline No current HEP though began this today as she now has clearance for ROM from surgeon-11/13/19; pt is independenet with HEP thus far and progressed this today-12/16/19    Status On-going                 Plan - 12/30/19 0924    Clinical Impression Statement Continued with AA/ROM stretching for bil pectoralis and  shoulders. Held off on reviewing supine scapular series as pt reports increased discomfort in chest wall. Then focused on manual therapy to Rt lateral trunk where pt c/o tightness/tenderness, and at bil medial scapular borders where pt c/o tightness. Also discussed her looking into getting a  TENS unit for home use and pt is interested in this.    Personal Factors and Comorbidities Comorbidity 1    Comorbidities bil masectomy with 3 lymph node removal on the R    Stability/Clinical Decision Making Stable/Uncomplicated    Rehab Potential Good    PT Frequency 1x / week    PT Duration 4 weeks    PT Treatment/Interventions Therapeutic activities;Therapeutic exercise;Neuromuscular re-education;Manual techniques;Cryotherapy;Moist Heat    PT Next Visit Plan Reassess new HEP and progress as tolerated. Did she get TENS unit? questions? Review supine scapular series/did she get to try with theraband? Cont manual therapy working to improve end Rt>Lt shoulder ROM and decrease Rt pectoralis tightness.    PT Home Exercise Plan Standing dowel exercises, supine scapular series; bil shoulder isometrics    Consulted and Agree with Plan of Care Patient           Patient will benefit from skilled therapeutic intervention in order to improve the following deficits and impairments:  Decreased knowledge of precautions, Pain, Decreased range of motion, Increased edema  Visit Diagnosis: Malignant neoplasm of upper-outer quadrant of right breast in female, estrogen receptor positive (HCC)  Stiffness of left shoulder, not elsewhere classified  Stiffness of right shoulder, not elsewhere classified  Acute pain of right shoulder  Acute pain of left shoulder  Abnormal posture     Problem List Patient Active Problem List   Diagnosis Date Noted  . Acquired absence of breast and absent nipple, bilateral 10/07/2019  . Breast wound, right, initial encounter 10/07/2019  . Breast cancer of upper-outer quadrant of  right female breast ([place]) 09/05/2019  . Genetic testing 08/12/2019  . Family history of BRCA gene mutation 07/31/2019  . Family history of breast cancer   . Family history of prostate cancer   . Family history of bladder cancer   . Malignant neoplasm of upper-outer quadrant of right breast in female, estrogen receptor positive ([place]) 07/24/2019  . Annual physical exam 03/31/2016  . Occupational bronchitis ([place]) 03/03/2015  . Hyperlipidemia 03/30/2014  . 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 Generalized osteoarthritis 02/23/2012  . Plantar fasciitis, bilateral 02/23/2012  . Vitiligo 02/23/2012  . Difficulty hearing 02/13/2012  . History of lupus 12/21/2011  . Vitamin D deficiency 10/11/2010  . Hypothyroidism 06/21/2010  . Systemic lupus erythematosus ([place]) 06/14/2010    Otelia Limes, PTA 12/30/2019, 10:07 AM  [place], [place], [address] Phone: [phone]   Fax:  [phone]  Name: Stephanie Chase MRN: [id number] Date of Birth: 05-17-1960

## 2019-12-31 ENCOUNTER — Telehealth: Payer: Self-pay | Admitting: Hematology

## 2019-12-31 NOTE — Telephone Encounter (Signed)
No 10/4 los °

## 2020-01-05 ENCOUNTER — Other Ambulatory Visit: Payer: BC Managed Care – PPO

## 2020-01-05 ENCOUNTER — Ambulatory Visit: Payer: BC Managed Care – PPO

## 2020-01-06 ENCOUNTER — Inpatient Hospital Stay: Payer: BC Managed Care – PPO

## 2020-01-06 ENCOUNTER — Other Ambulatory Visit: Payer: Self-pay

## 2020-01-06 ENCOUNTER — Other Ambulatory Visit: Payer: Self-pay | Admitting: Hematology

## 2020-01-06 VITALS — BP 108/84 | HR 69 | Temp 97.8°F | Resp 16 | Wt 168.5 lb

## 2020-01-06 DIAGNOSIS — Z170 Estrogen receptor positive status [ER+]: Secondary | ICD-10-CM

## 2020-01-06 DIAGNOSIS — C50411 Malignant neoplasm of upper-outer quadrant of right female breast: Secondary | ICD-10-CM

## 2020-01-06 DIAGNOSIS — Z5112 Encounter for antineoplastic immunotherapy: Secondary | ICD-10-CM | POA: Diagnosis not present

## 2020-01-06 LAB — CMP (CANCER CENTER ONLY)
ALT: 24 U/L (ref 0–44)
AST: 24 U/L (ref 15–41)
Albumin: 3.8 g/dL (ref 3.5–5.0)
Alkaline Phosphatase: 98 U/L (ref 38–126)
Anion gap: 6 (ref 5–15)
BUN: 10 mg/dL (ref 6–20)
CO2: 27 mmol/L (ref 22–32)
Calcium: 10 mg/dL (ref 8.9–10.3)
Chloride: 106 mmol/L (ref 98–111)
Creatinine: 0.67 mg/dL (ref 0.44–1.00)
GFR, Estimated: >60 mL/min (ref >60)
Glucose, Bld: 87 mg/dL (ref 70–99)
Potassium: 4.2 mmol/L (ref 3.5–5.1)
Sodium: 139 mmol/L (ref 135–145)
Total Bilirubin: 0.2 mg/dL — ABNORMAL LOW (ref 0.3–1.2)
Total Protein: 7.1 g/dL (ref 6.5–8.1)

## 2020-01-06 LAB — CBC WITH DIFFERENTIAL (CANCER CENTER ONLY)
Abs Immature Granulocytes: 0.05 10*3/uL (ref 0.00–0.07)
Basophils Absolute: 0.1 10*3/uL (ref 0.0–0.1)
Basophils Relative: 1 %
Eosinophils Absolute: 0.1 10*3/uL (ref 0.0–0.5)
Eosinophils Relative: 1 %
HCT: 37.9 % (ref 36.0–46.0)
Hemoglobin: 12.6 g/dL (ref 12.0–15.0)
Immature Granulocytes: 1 %
Lymphocytes Relative: 25 %
Lymphs Abs: 1.9 10*3/uL (ref 0.7–4.0)
MCH: 29.2 pg (ref 26.0–34.0)
MCHC: 33.2 g/dL (ref 30.0–36.0)
MCV: 87.7 fL (ref 80.0–100.0)
Monocytes Absolute: 0.7 10*3/uL (ref 0.1–1.0)
Monocytes Relative: 9 %
Neutro Abs: 4.8 10*3/uL (ref 1.7–7.7)
Neutrophils Relative %: 63 %
Platelet Count: 299 10*3/uL (ref 150–400)
RBC: 4.32 MIL/uL (ref 3.87–5.11)
RDW: 15.2 % (ref 11.5–15.5)
WBC Count: 7.5 10*3/uL (ref 4.0–10.5)
nRBC: 0 % (ref 0.0–0.2)

## 2020-01-06 MED ORDER — HEPARIN SOD (PORK) LOCK FLUSH 100 UNIT/ML IV SOLN
500.0000 [IU] | Freq: Once | INTRAVENOUS | Status: AC | PRN
  Administered 2020-01-06: 500 [IU]
  Filled 2020-01-06: qty 5

## 2020-01-06 MED ORDER — SODIUM CHLORIDE 0.9 % IV SOLN
40.0000 mg | Freq: Once | INTRAVENOUS | Status: DC
  Filled 2020-01-06: qty 4

## 2020-01-06 MED ORDER — SODIUM CHLORIDE 0.9% FLUSH
10.0000 mL | INTRAVENOUS | Status: DC | PRN
  Administered 2020-01-06: 10 mL
  Filled 2020-01-06: qty 10

## 2020-01-06 MED ORDER — ACETAMINOPHEN 325 MG PO TABS
ORAL_TABLET | ORAL | Status: AC
  Filled 2020-01-06: qty 2

## 2020-01-06 MED ORDER — SODIUM CHLORIDE 0.9 % IV SOLN
Freq: Once | INTRAVENOUS | Status: AC
  Filled 2020-01-06: qty 250

## 2020-01-06 MED ORDER — ACETAMINOPHEN 325 MG PO TABS
650.0000 mg | ORAL_TABLET | Freq: Once | ORAL | Status: AC
  Administered 2020-01-06: 650 mg via ORAL

## 2020-01-06 MED ORDER — DIPHENHYDRAMINE HCL 50 MG/ML IJ SOLN
25.0000 mg | Freq: Once | INTRAMUSCULAR | Status: AC
  Administered 2020-01-06: 25 mg via INTRAVENOUS

## 2020-01-06 MED ORDER — TRASTUZUMAB-QYYP CHEMO 420 MG IV SOLR
150.0000 mg | Freq: Once | INTRAVENOUS | Status: AC
  Administered 2020-01-06: 150 mg via INTRAVENOUS
  Filled 2020-01-06: qty 7.14

## 2020-01-06 MED ORDER — DIPHENHYDRAMINE HCL 50 MG/ML IJ SOLN
INTRAMUSCULAR | Status: AC
  Filled 2020-01-06: qty 1

## 2020-01-06 MED ORDER — SODIUM CHLORIDE 0.9 % IV SOLN
80.0000 mg/m2 | Freq: Once | INTRAVENOUS | Status: AC
  Administered 2020-01-06: 144 mg via INTRAVENOUS
  Filled 2020-01-06: qty 24

## 2020-01-06 MED ORDER — SODIUM CHLORIDE 0.9 % IV SOLN
40.0000 mg | Freq: Once | INTRAVENOUS | Status: AC
  Administered 2020-01-06: 40 mg via INTRAVENOUS
  Filled 2020-01-06: qty 4

## 2020-01-06 MED ORDER — FAMOTIDINE IN NACL 20-0.9 MG/50ML-% IV SOLN
INTRAVENOUS | Status: AC
  Filled 2020-01-06: qty 50

## 2020-01-06 MED ORDER — SODIUM CHLORIDE 0.9 % IV SOLN
10.0000 mg | Freq: Once | INTRAVENOUS | Status: AC
  Administered 2020-01-06: 10 mg via INTRAVENOUS
  Filled 2020-01-06: qty 10

## 2020-01-06 MED ORDER — DEXILANT 30 MG PO CPDR: 30.0000 mg | DELAYED_RELEASE_CAPSULE | Freq: Every day | ORAL | 1 refills | Status: DC

## 2020-01-06 NOTE — Patient Instructions (Signed)
[facility] Discharge Instructions for Patients Receiving Chemotherapy  Today you received the following chemotherapy agents: Transtuzumab and Paclitaxel (Taxol)  To help prevent nausea and vomiting after your treatment, we encourage you to take your nausea medication as directed by your MD.   If you develop nausea and vomiting that is not controlled by your nausea medication, call the clinic.   BELOW ARE SYMPTOMS THAT SHOULD BE REPORTED IMMEDIATELY:  *FEVER GREATER THAN 100.5 F  *CHILLS WITH OR WITHOUT FEVER  NAUSEA AND VOMITING THAT IS NOT CONTROLLED WITH YOUR NAUSEA MEDICATION  *UNUSUAL SHORTNESS OF BREATH  *UNUSUAL BRUISING OR BLEEDING  TENDERNESS IN MOUTH AND THROAT WITH OR WITHOUT PRESENCE OF ULCERS  *URINARY PROBLEMS  *BOWEL PROBLEMS  UNUSUAL RASH Items with * indicate a potential emergency and should be followed up as soon as possible.  Feel free to call the clinic should you have any questions or concerns. The clinic phone number is [phone].  Please show the CHEMO ALERT CARD at check-in to the Emergency Department and triage nurse.   

## 2020-01-07 ENCOUNTER — Other Ambulatory Visit: Payer: Self-pay

## 2020-01-07 ENCOUNTER — Ambulatory Visit: Payer: BC Managed Care – PPO

## 2020-01-07 DIAGNOSIS — R293 Abnormal posture: Secondary | ICD-10-CM

## 2020-01-07 DIAGNOSIS — R0600 Dyspnea, unspecified: Secondary | ICD-10-CM

## 2020-01-07 DIAGNOSIS — M25611 Stiffness of right shoulder, not elsewhere classified: Secondary | ICD-10-CM

## 2020-01-07 DIAGNOSIS — C50411 Malignant neoplasm of upper-outer quadrant of right female breast: Secondary | ICD-10-CM | POA: Diagnosis not present

## 2020-01-07 DIAGNOSIS — M25511 Pain in right shoulder: Secondary | ICD-10-CM

## 2020-01-07 DIAGNOSIS — M25612 Stiffness of left shoulder, not elsewhere classified: Secondary | ICD-10-CM

## 2020-01-07 DIAGNOSIS — Z170 Estrogen receptor positive status [ER+]: Secondary | ICD-10-CM

## 2020-01-07 DIAGNOSIS — M25512 Pain in left shoulder: Secondary | ICD-10-CM

## 2020-01-07 LAB — CANCER ANTIGEN 27.29: CA 27.29: 15.1 U/mL (ref 0.0–38.6)

## 2020-01-07 MED ORDER — ALBUTEROL SULFATE HFA 108 (90 BASE) MCG/ACT IN AERS: 2.0000 | INHALATION_SPRAY | Freq: Four times a day (QID) | RESPIRATORY_TRACT | 2 refills | Status: DC | PRN

## 2020-01-07 NOTE — Progress Notes (Signed)
Received notification from [facility] pharmacy that insurance does not cover the proair respiclick and requested generic  Albuterol.  Pt notified and agrees with change.

## 2020-01-07 NOTE — Therapy (Signed)
[place], [place], [address] Phone: [phone]   Fax:  [phone]  Physical Therapy Treatment  Patient Details  Name: Stephanie Chase MRN: [id number] Date of Birth: 08-08-1960 Referring Provider (PT): Dr. Stark Klein   Encounter Date: 01/07/2020   PT End of Session - 01/07/20 0908    Visit Number 14    Number of Visits 22    Date for PT Re-Evaluation 01/13/20    PT Start Time 0808    PT Stop Time 0907    PT Time Calculation (min) 59 min    Activity Tolerance Patient tolerated treatment well    Behavior During Therapy [facility] for tasks assessed/performed           Past Medical History:  Diagnosis Date  . Anemia   . Anxiety   . Asthma   . Breast cancer ([place])   . COPD (chronic obstructive pulmonary disease) ([place])   . Discoid lupus   . Family history of bladder cancer   . Family history of BRCA gene mutation   . Family history of breast cancer   . Family history of prostate cancer   . GERD (gastroesophageal reflux disease)   . Hypothyroidism   . Pneumonia   . Thyroid disease     Past Surgical History:  Procedure Laterality Date  . ABLATION ON ENDOMETRIOSIS    . APPLICATION OF A-CELL OF EXTREMITY Right 10/22/2019   Procedure: EXCISION OF RIGHT BREAST WOUND WITH PRIMARY CLOSURE;  Surgeon: [name] Going, DO;  Location: [place];  Service: Plastics;  Laterality: Right;  . BREAST LUMPECTOMY    . DEBRIDEMENT AND CLOSURE WOUND Right 10/22/2019   Procedure: Excision of right breast wound;  Surgeon: [name] Going, DO;  Location: [place];  Service: Plastics;  Laterality: Right;  . MASTECTOMY W/ SENTINEL NODE BIOPSY Bilateral 09/05/2019   Procedure: BILATERAL MASTECTOMY WITH RIGHT SENTINEL LYMPH NODE BIOPSY;  Surgeon: Stark Klein, MD;  Location: [place];  Service: General;  Laterality: Bilateral;  COMBINED WITH REGIONAL FOR POST OP PAIN  . PORTACATH PLACEMENT Left 09/05/2019   Procedure:  INSERTION PORT-A-CATH WITH ULTRASOUND GUIDANCE;  Surgeon: Stark Klein, MD;  Location: [place];  Service: General;  Laterality: Left;  . TONSILLECTOMY    . TUBAL LIGATION    . WISDOM TOOTH EXTRACTION      There were no vitals filed for this visit.   Subjective Assessment - 01/07/20 0811    Subjective I've started having more joint achiness since starting chemo so I've been taking more pain meds than I was. I can't even wear my prosthesis right now because my chest and underarms are so achy right now. I have a spot in particular in my mid-back that bothers me most when I sleep at night making me feel restless.    Pertinent History Bil masectomy 09/05/19 with 3 lymph nodes removed and negative on the R and benign tisuse on the L. Patient was diagnosed on 06/09/2019 with right grade III invasive ductal carcinoma breast cancer. It mesures 1.3 cm in the upper outer quadrant with 1.6 cm of calcifications and DCIS. It is ER positive, PR negative, and HER2 positive with a Ki67 of 50%. She smokes about 1 pack/day.    Patient Stated Goals I want to get back to kayaking.    Currently in Pain? No/denies   just feel tight all over from chemo             [facility] PT  Assessment - 01/07/20 0001      AROM   Right Shoulder Flexion 154 Degrees    Right Shoulder ABduction 170 Degrees    Right Shoulder Internal Rotation 70 Degrees    Left Shoulder Flexion 156 Degrees    Left Shoulder ABduction 173 Degrees    Left Shoulder Internal Rotation 70 Degrees                         [facility] Adult PT Treatment/Exercise - 01/07/20 0001      Shoulder Exercises: Pulleys   Flexion 2 minutes    Flexion Limitations VCs to relax shoulders    ABduction 2 minutes      Shoulder Exercises: Therapy Ball   Flexion Both;10 reps   forward lean into end of stretch   ABduction Right;Left;10 reps   same side lean into end of stretch     Shoulder Exercises: Stretch   Other Shoulder Stretches Modified downward dog on  wall 5x, 5 sec holds       Manual Therapy   Soft tissue mobilization With coconut oil to Rt pectoralis insertion during P/ROM; left sidelying for Rt. lateal trunk work with trigger point release, also to medial Rt and Lt scapular borders for trigger point release    Myofascial Release To Lt axilla at end P/ROM stretching                    PT Short Term Goals - 12/16/19 1005      PT SHORT TERM GOAL #1   Title Pt will be independent with HEP and Modified MLD for the anterior trunk within 2 weeks in order to demonstrate autonomy of care.    Baseline pt provided with HEP today; pt independent with this now-12/16/19    Status Achieved             PT Long Term Goals - 12/16/19 0855      PT LONG TERM GOAL #1   Title Pt will demonstrate 150 degrees bil shoulder flexion and abduction within 4 weeks to demonstrate return to pre-operative AROM.    Baseline R shoulder flexion: 110, abduction: 69 L shoulder flexion: 118, abduction:  77; approximate AA/ROM with pulleys Rt 120 and abduction 140 degrees, Lt 150 degrees flexion and abduction (forgot to measure but this is visibly much improved since last measured)-11/13/19; Rt flex149, abd 152 with discomfort and pulling reported at top of shoulder and Lt flex 154, abd 170 - 12/16/19    Status Partially Met      PT LONG TERM GOAL #2   Title Pt will report 75% improvement in functional mobility and pain in order to demonstrate an improvement in subjective quality of life.    Baseline 2/10 pain, decresaed ROM below shoulder height. MOdifications with bathing/dressing; pt reports 50% improvement at this time as she is still limited with vaccuuming, lifting and reaching above head-11/13/19; pt reports 75-80% improvement at this time-12/16/19    Status Achieved      PT LONG TERM GOAL #3   Title Pt to be independent with HEP for improved A/ROM for bil shoulders and bil UE and postural strength to improve ease with ADLs.    Baseline No current HEP  though began this today as she now has clearance for ROM from surgeon-11/13/19; pt is independenet with HEP thus far and progressed this today-12/16/19    Status On-going                   Plan - 01/07/20 1239    Clinical Impression Statement Continued with focus on manual therapy, including bil medial borders of scapulae where trigger points were palpable and then released. Also continued with STM to Rt lateral trunk where pt still palpably tight and very tender. She reports feeling frustrated that her A/ROM has improved but overall tightness continues to return after stretching. Educated her that for now that is probably a side effect from chemo as she reports overall she has been feeling increased joint achiness and tightness.  Pt verbalized understanding and encouraged her to continue being consistent with stretching to help counteract this. Pt was pleased by the fact that all her bil shoulder A/ROM has improved since start of care.    Personal Factors and Comorbidities Comorbidity 1    Comorbidities bil masectomy with 3 lymph node removal on the R    Stability/Clinical Decision Making Stable/Uncomplicated    Rehab Potential Good    PT Frequency 1x / week    PT Duration 4 weeks    PT Treatment/Interventions Therapeutic activities;Therapeutic exercise;Neuromuscular re-education;Manual techniques;Cryotherapy;Moist Heat    PT Next Visit Plan Try bil UE 3 way raises and add to HEP if these tolerated without increase pain; Did she get TENS unit? questions? Review supine scapular series/did she get to try with theraband? Cont manual therapy working to improve end Rt>Lt shoulder ROM and decrease Rt pectoralis tightness.    PT Home Exercise Plan Standing dowel exercises, supine scapular series; bil shoulder isometrics    Consulted and Agree with Plan of Care Patient           Patient will benefit from skilled therapeutic intervention in order to improve the following deficits and impairments:   Decreased knowledge of precautions, Pain, Decreased range of motion, Increased edema  Visit Diagnosis: Malignant neoplasm of upper-outer quadrant of right breast in female, estrogen receptor positive (HCC)  Stiffness of left shoulder, not elsewhere classified  Stiffness of right shoulder, not elsewhere classified  Acute pain of right shoulder  Acute pain of left shoulder  Abnormal posture     Problem List Patient Active Problem List   Diagnosis Date Noted  . Acquired absence of breast and absent nipple, bilateral 10/07/2019  . Breast wound, right, initial encounter 10/07/2019  . Breast cancer of upper-outer quadrant of right female breast ([facility]CC) 09/05/2019  . Genetic testing 08/12/2019  . Family history of BRCA gene mutation 07/31/2019  . Family history of breast cancer   . Family history of prostate cancer   . Family history of bladder cancer   . Malignant neoplasm of upper-outer quadrant of right breast in female, estrogen receptor positive ([facility]CC) 07/24/2019  . Annual physical exam 03/31/2016  . Occupational bronchitis ([facility]) 03/03/2015  . Hyperlipidemia 03/30/2014  . 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 Generalized osteoarthritis 02/23/2012  . Plantar fasciitis, bilateral 02/23/2012  . Vitiligo 02/23/2012  . Difficulty hearing 02/13/2012  . History of lupus 12/21/2011  . Vitamin D deficiency 10/11/2010  . Hypothyroidism 06/21/2010  . Systemic lupus erythematosus (HCC) 06/14/2010    Rosenberger, Valerie Ann, PTA 01/07/2020, 12:46 PM  [facility] [address] [place], [place], [address] Phone: [phone]   Fax:  [phone]  Name: Stephanie Chase MRN: [id number] Date of Birth: 04/04/1960   

## 2020-01-11 NOTE — Progress Notes (Signed)
[place]   [phone] Fax:([phone]   Clinic Follow up Note   Patient Care Team: Delsa Bern, MD as PCP - General (Obstetrics and Gynecology) Richrd Prime as Consulting Physician (Obstetrics and Gynecology) Mauro Kaufmann, RN as Oncology Nurse Navigator Rockwell Germany, RN as Oncology Nurse Navigator Stark Klein, MD as Consulting Physician (General Surgery) Truitt Merle, MD as Consulting Physician (Hematology) Kyung Rudd, MD as Consulting Physician (Radiation Oncology) Register, Luetta Nutting, PA-C as Physician Assistant (Dermatology) [name] Going, DO as Attending Physician (Plastic Surgery) 01/12/2020  CHIEF COMPLAINT: F/u right breast cancer   SUMMARY OF ONCOLOGIC HISTORY: Oncology History Overview Note  Cancer Staging Malignant neoplasm of upper-outer quadrant of right breast in female, estrogen receptor positive ([place]) Staging form: Breast, AJCC 8th Edition - Clinical stage from 07/30/2019: Stage IA (cT1c, cN0, cM0, G3, ER+, PR-, HER2+) - Unsigned    Malignant neoplasm of upper-outer quadrant of right breast in female, estrogen receptor positive ([place])  07/03/2019 Mammogram   Diagnostic Mammogram  IMPRESSION There is a 1.3x1.1cm focal asymmetry in the retroareolar anterior to middle depth right breast 2.2cm from the nipple.  There is a 2 cm focal asymmetry with appearance in the upper outer quadrant posterior depth of right breast 3 cm from nipple -Both suspicious and biopsy recommended.     07/18/2019 Initial Biopsy   Diagnosis 07/18/19 1. Breast, right, needle core biopsy, 12 o'clock - INVASIVE DUCTAL CARCINOMA. 2. Breast, right, needle core biopsy, upper outer quadrant - DUCTAL CARCINOMA IN SITU. Microscopic Comment 1. The invasive carcinoma is nuclear grade 3. The greatest linear extent of tumor in any one core is 11 mm. Ancillary studies will be reported separately. CKAE1AE3, GATA-3, ER and E-cadherin are positive. PAX 8 is  negative. CK7 is noncontributory. 2. The in situ carcinoma is high nuclear grade with central necrosis and calcifications. E-cadherin is positive. P63, Calponin and SMM-1 demonstrate the present of myoepithelium.   07/18/2019 Receptors her2   1. PROGNOSTIC INDICATORS 07/18/19 Results: IMMUNOHISTOCHEMICAL AND MORPHOMETRIC ANALYSIS PERFORMED MANUALLY The tumor cells are POSITIVE for Her2 (3+). Of note, the tumor shows heterogeneity in regards to Her2 expression. Estrogen Receptor: 95%, POSITIVE, STRONG STAINING INTENSITY Progesterone Receptor: 0%, NEGATIVE Proliferation Marker Ki67: 50%   07/24/2019 Initial Diagnosis   Malignant neoplasm of upper-outer quadrant of right breast in female, estrogen receptor positive ([place])   08/01/2019 Breast MRI   IMPRESSION: 1. Known invasive ductal carcinoma measures 1.2 centimeters in the 12 o'clock location. 2. Non mass enhancement measures 2.3 centimeters in the LATERAL portion of the RIGHT breast corresponding to DCIS on biopsy. 3. An additional mass in the UPPER OUTER QUADRANT of the RIGHT breast is 1.5 centimeters and warrants tissue diagnosis. This area correlates with distortion seen mammographically. 4. No axillary adenopathy or ancillary findings. LEFT breast is negative.     08/04/2019 Echocardiogram   IMPRESSIONS     1. Left ventricular ejection fraction, by estimation, is 65 to 70%. The  left ventricle has normal function. The left ventricle has no regional  wall motion abnormalities. Left ventricular diastolic parameters were  normal. The average left ventricular  global longitudinal strain is -20.4 %.   2. Right ventricular systolic function is normal. The right ventricular  size is normal.   3. The mitral valve is normal in structure. No evidence of mitral valve  regurgitation. No evidence of mitral stenosis.   4. The aortic valve is normal in structure. Aortic valve regurgitation is  not visualized. No aortic  stenosis is present.      08/10/2019 Genetic Testing   Negative genetic testing:  No pathogenic variants detected on the [facility] Breast Cancer STAT panel + Common Hereditary Cancers panel. A variant of uncertain significance (VUS) was detected in the ATM gene called c.3834C>A. The report date is 08/10/2019.  The Breast Cancer STAT Panel offered by [facility] includes sequencing and deletion/duplication analysis for the following 9 genes:  ATM, BRCA1, BRCA2, CDH1, CHEK2, PALB2, PTEN, STK11 and TP53. The Common Hereditary Cancers Panel offered by [facility] includes sequencing and/or deletion duplication testing of the following 48 genes: APC, ATM, AXIN2, BARD1, BMPR1A, BRCA1, BRCA2, BRIP1, CDH1, CDK4, CDKN2A (p14ARF), CDKN2A (p16INK4a), CHEK2, CTNNA1, DICER1, EPCAM (Deletion/duplication testing only), GREM1 (promoter region deletion/duplication testing only), KIT, MEN1, MLH1, MSH2, MSH3, MSH6, MUTYH, NBN, NF1, NHTL1, PALB2, PDGFRA, PMS2, POLD1, POLE, PTEN, RAD50, RAD51C, RAD51D, RNF43, SDHB, SDHC, SDHD, SMAD4, SMARCA4. STK11, TP53, TSC1, TSC2, and VHL.  The following genes were evaluated for sequence changes only: SDHA and HOXB13 c.251G>A variant only.    09/05/2019 Surgery   BILATERAL MASTECTOMY WITH RIGHT SENTINEL LYMPH NODE BIOPSY and PAC placement by Dr Barry Dienes    09/05/2019 Pathology Results   FINAL MICROSCOPIC DIAGNOSIS:   A. BREAST, LEFT, MASTECTOMY:  - Benign breast tissue.   B. BREAST, RIGHT, MASTECTOMY:  - Invasive ductal carcinoma, multifocal, 1.4 cm in greatest dimension,  [place] grade 3 of 3.  - Ductal carcinoma in situ, high nuclear grade with central necrosis and  calcifications.  - Margins of resection are not involved.  - Biopsy sites.  - See oncology table.   C. SENTINEL LYMPH NODE, RIGHT AXILLARY #1, BIOPSY:  - One lymph node, negative for carcinoma (0/1).   D. SENTINEL LYMPH NODE, RIGHT AXILLARY #2, BIOPSY:  - One lymph node, negative for carcinoma (0/1).   E. SENTINEL LYMPH NODE, RIGHT AXILLARY  #3, BIOPSY:  - One lymph node, negative for carcinoma (0/1).     Estrogen Receptor: 95%, positive, strong             Progesterone Receptor: 0%, negative             HER2: Positive (3+)             Ki-67: 50%    09/05/2019 Cancer Staging   Staging form: Breast, AJCC 8th Edition - Pathologic stage from 09/05/2019: Stage IA (pT1c, pN0, cM0, G3, ER+, PR-, HER2+) - Signed by Gardenia Phlegm, NP on 09/17/2019   10/22/2019 Surgery   Excision of right breast wound and APPLICATION OF A-CELL, INTEGRA BWM OR CELLERATE by Dr Marla Roe on 10/22/19   10/27/2019 -  Chemotherapy   Weekly Taxol and Herceptin for 12 weeks starting 10/27/19 then maintenance Herceptin q3weeks to complete 1 year treatment. Given recent right breast infection and debridement, will start Herceptin on 10/27/19 with loading dose (q2weeks) and add Taxol with week 3 on 11/10/19 then continuing with both weekly.  -Due to tinnitus, her Kanjinti (Herceptin) was changed to weekly Trazimera on 11/24/19.      CURRENT THERAPY: Weekly Taxol and Trazimerafor12 weeksstarting 8/2/21then maintenance Trazimeraq3weeksto complete 1 yeartreatment.Taxol started on 11/10/19, dose reduced with week 10 due to neuropathy  INTERVAL HISTORY: Ms. Sharpless returns for f/u and treatment as scheduled. She received week 10 taxol/Trazimera on 01/06/20.  She is doing okay.  She continues to have ringing in her ears and pain, ENT eval noted moderate to severe hearing loss, and the symptoms could be side effect from that.  Denies any improvement  or change with different Herceptin formulations.  She has dry itching eyes, no redness or pain.  Denies vision changes.  Her arms feel "limp" in in the last 1-2 weeks she has tingling in her fingertips and tingling with burning in her feet.  Denies fall, difficulty with ambulation, any functional deficits, or dropping things.  She has intermittent shortness of breath, not always associated with activity.  Denies  significant cough, fever, chills.  Her heartburn is stable.  She has 1-3 loose stools and gas pain daily for 1-3 weeks, previously had constipation.  She also notes blood in her stool.  She plans to schedule colonoscopy after she completes chemo.  No nausea or vomiting.   MEDICAL HISTORY:  Past Medical History:  Diagnosis Date  . Anemia   . Anxiety   . Asthma   . Breast cancer ([place])   . COPD (chronic obstructive pulmonary disease) ([place])   . Discoid lupus   . Family history of bladder cancer   . Family history of BRCA gene mutation   . Family history of breast cancer   . Family history of prostate cancer   . GERD (gastroesophageal reflux disease)   . Hypothyroidism   . Pneumonia   . Thyroid disease     SURGICAL HISTORY: Past Surgical History:  Procedure Laterality Date  . ABLATION ON ENDOMETRIOSIS    . APPLICATION OF A-CELL OF EXTREMITY Right 10/22/2019   Procedure: EXCISION OF RIGHT BREAST WOUND WITH PRIMARY CLOSURE;  Surgeon: [name] Going, DO;  Location: [facility];  Service: Plastics;  Laterality: Right;  . BREAST LUMPECTOMY    . DEBRIDEMENT AND CLOSURE WOUND Right 10/22/2019   Procedure: Excision of right breast wound;  Surgeon: [name] Going, DO;  Location: [facility];  Service: Plastics;  Laterality: Right;  . MASTECTOMY W/ SENTINEL NODE BIOPSY Bilateral 09/05/2019   Procedure: BILATERAL MASTECTOMY WITH RIGHT SENTINEL LYMPH NODE BIOPSY;  Surgeon: Stark Klein, MD;  Location: [facility];  Service: General;  Laterality: Bilateral;  COMBINED WITH REGIONAL FOR POST OP PAIN  . PORTACATH PLACEMENT Left 09/05/2019   Procedure: INSERTION PORT-A-CATH WITH ULTRASOUND GUIDANCE;  Surgeon: Stark Klein, MD;  Location: [facility];  Service: General;  Laterality: Left;  . TONSILLECTOMY    . TUBAL LIGATION    . WISDOM TOOTH EXTRACTION      I have reviewed the social history and family history with the patient and they are unchanged from previous note.  ALLERGIES:  is allergic to bactrim  [sulfamethoxazole-trimethoprim].  MEDICATIONS:  Current Outpatient Medications  Medication Sig Dispense Refill  . acetaminophen (TYLENOL) 500 MG tablet Take 500-1,000 mg by mouth every 6 (six) hours as needed for moderate pain.     Marland Kitchen albuterol (VENTOLIN HFA) 108 (90 Base) MCG/ACT inhaler Inhale 2 puffs into the lungs every 6 (six) hours as needed for wheezing or shortness of breath. 8 g 2  . ALPRAZolam (XANAX) 0.25 MG tablet Take 1 tablet (0.25 mg total) by mouth daily as needed for anxiety. 30 tablet 0  . Ascorbic Acid (VITAMIN C) 1000 MG tablet Take 1,000 mg by mouth daily.    . cetirizine (ZYRTEC) 10 MG tablet Take 10 mg by mouth daily as needed for allergies.     . Cholecalciferol (VITAMIN D) 50 MCG (2000 UT) tablet Take 2,000 Units by mouth daily.    Marland Kitchen Dexlansoprazole (DEXILANT) 30 MG capsule Take 1 capsule (30 mg total) by mouth daily. 30 capsule 1  . dextromethorphan (DELSYM) 30 MG/5ML liquid Take  60 mg by mouth 2 (two) times daily as needed for cough.     . fluticasone (FLONASE) 50 MCG/ACT nasal spray Place 1 spray into both nostrils daily as needed for allergies.     . fluticasone (FLONASE) 50 MCG/ACT nasal spray Place 2 sprays into both nostrils in the morning and at bedtime. 16 g 5  . levothyroxine (SYNTHROID) 75 MCG tablet Take 75 mcg by mouth daily before breakfast.    . lidocaine-prilocaine (EMLA) cream Apply 1 application topically as needed. (Patient taking differently: Apply 1 application topically daily as needed (port access). ) 30 g 1  . liothyronine (CYTOMEL) 5 MCG tablet TAKE 2 TABLETS (=10MCG     TOTAL)     DAILY (Patient taking differently: Take 10 mcg by mouth daily. ) 180 tablet 1  . methocarbamol (ROBAXIN) 500 MG tablet Take 1 tablet (500 mg total) by mouth every 6 (six) hours as needed (use for muscle cramps/pain). 20 tablet 1  . Multiple Vitamin (MULTIVITAMIN WITH MINERALS) TABS tablet Take 1 tablet by mouth 2 (two) times daily.     . nicotine (NICODERM CQ - DOSED IN  MG/24 HOURS) 21 mg/24hr patch Place 21 mg onto the skin daily.    Marland Kitchen nystatin (MYCOSTATIN) 100000 UNIT/ML suspension Take 5 mLs (500,000 Units total) by mouth 4 (four) times daily. 473 mL 1  . ondansetron (ZOFRAN) 8 MG tablet Take 1 tablet (8 mg total) by mouth 2 (two) times daily as needed (Nausea or vomiting). 30 tablet 1  . OVER THE COUNTER MEDICATION Take 1 each by mouth 2 (two) times daily as needed (anxiety / sleep). CBD Gummies    . oxyCODONE (OXY IR/ROXICODONE) 5 MG immediate release tablet Take 1 tablet (5 mg total) by mouth every 6 (six) hours as needed for breakthrough pain. 20 tablet 0  . pantoprazole (PROTONIX) 40 MG tablet Take 1 tablet (40 mg total) by mouth daily. 30 tablet 2  . prochlorperazine (COMPAZINE) 10 MG tablet Take 1 tablet (10 mg total) by mouth every 6 (six) hours as needed (Nausea or vomiting). 30 tablet 1   No current facility-administered medications for this visit.   Facility-Administered Medications Ordered in Other Visits  Medication Dose Route Frequency Provider Last Rate Last Admin  . alum & mag hydroxide-simeth (MAALOX/MYLANTA) 200-200-20 MG/5ML suspension 30 mL  30 mL Oral Once Sandi Mealy E., PA-C      . dexamethasone (DECADRON) 5 mg in sodium chloride 0.9 % 50 mL IVPB  5 mg Intravenous Once Harle Stanford., PA-C      . dexamethasone (DECADRON) injection 5 mg  5 mg Intravenous Once Cira Rue K, NP      . heparin lock flush 100 unit/mL  500 Units Intracatheter Once PRN Truitt Merle, MD      . sodium chloride flush (NS) 0.9 % injection 10 mL  10 mL Intracatheter PRN Truitt Merle, MD        PHYSICAL EXAMINATION: ECOG PERFORMANCE STATUS: 1 - Symptomatic but completely ambulatory  Vitals:   01/12/20 1310  BP: 118/82  Pulse: 72  Resp: 20  Temp: (!) 97 F (36.1 C)  SpO2: 100%   Filed Weights   01/12/20 1310  Weight: 170 lb 1.6 oz (77.2 kg)    GENERAL:alert, no distress and comfortable SKIN: No rash to exposed skin EYES: sclera clear OROPHARYNX: No  thrush or ulcers NECK: Without mass LUNGS: Distant but clear, normal breathing effort.  No wheezes or adventitious sounds HEART: regular rate & rhythm,  no lower extremity edema NEURO: alert & oriented x 3 with fluent speech.  Intact peripheral vibratory sense over the fingertips bilaterally per tuning fork exam Breast exam s/p bilateral mastectomy, incisions well-healed, no open wound PAC without erythema  LABORATORY DATA:  I have reviewed the data as listed CBC Latest Ref Rng & Units 01/12/2020 01/06/2020 12/29/2019  WBC 4.0 - 10.5 K/uL 7.3 7.5 7.9  Hemoglobin 12.0 - 15.0 g/dL 11.7(L) 12.6 12.2  Hematocrit 36 - 46 % 34.8(L) 37.9 36.4  Platelets 150 - 400 K/uL 282 299 297     CMP Latest Ref Rng & Units 01/12/2020 01/06/2020 12/29/2019  Glucose 70 - 99 mg/dL 95 87 83  BUN 6 - 20 mg/dL '9 10 9  ' Creatinine 0.44 - 1.00 mg/dL 0.66 0.67 0.86  Sodium 135 - 145 mmol/L 138 139 139  Potassium 3.5 - 5.1 mmol/L 3.9 4.2 4.1  Chloride 98 - 111 mmol/L 107 106 107  CO2 22 - 32 mmol/L '28 27 27  ' Calcium 8.9 - 10.3 mg/dL 9.9 10.0 10.0  Total Protein 6.5 - 8.1 g/dL 6.8 7.1 7.1  Total Bilirubin 0.3 - 1.2 mg/dL 0.2(L) 0.2(L) 0.4  Alkaline Phos 38 - 126 U/L 91 98 95  AST 15 - 41 U/L '24 24 29  ' ALT 0 - 44 U/L '23 24 29      ' RADIOGRAPHIC STUDIES: I have personally reviewed the radiological images as listed and agreed with the findings in the report. No results found.   ASSESSMENT & PLAN: Suri Tafolla a 59 y.o.femalewith   1.Malignant neoplasm of upper-outer quadrant of right breast,invasive ductal carcinoma and DCIS,StageIA,pT1cN0M0,ER+/HER2+, PR-,Grade III -She was diagnosed in 06/2019.Given multifocal disease, she underwent right mastectomy and SLNBandprophylactic left mastectomyby Dr Barry Dienes on 09/05/19.Left chest port placed during surgery. -She developed right mastectomy wound and necrosis, s/p wound debridement by Dr. Audelia Hives on 10/22/19, she healed completely -Due to  node-negative, postmastectomy radiation is not recommended -Given her HER2 positive high grade disease, adjuvant chemotherapy is recommended to reduce her high risk of recurrence. Given her small tumorand negative node,Dr. Burr Medico recommendedless intensiveadjuvantchemotherapy with weekly Taxol and anti-HER2 Herceptin for 12 weeks and continue maintenance Herceptinq3weeksalone to complete 1 year treatment.She has the option of Herceptin injections after chemo.She consented to treatment. Goal is curative. -plan to begin adjuvant anti-estrogen therapy after chemo -due to right mastectomy wound necrosis s/p debridement, chemo was postponed; she started herceptin with loading dose on 8/2, then skipped a week and added taxol with week 3 -She developed early neuropathy after week 9 Taxol, no functional difficulties or sensory deficits.  We will continue chemo with dose reduced Taxol  2. Genetic Testing  -She has family history of breast, bladder, prostate cancer. -She does have negative genetic testing: No pathogenic variants detected on the [facility] Breast Cancer STAT panel + Common Hereditary Cancers panel. A variant of uncertain significance (VUS) was detected in the ATM gene called c.3834C>A. The report date is 08/10/2019.She can repeat testing in 5-10 years.  3. Smoking Cessation, COPD -She has been smoking since the 2nd grade. She has been able to cut down recentlyto half pack per day.  -Per pt at some point shewas diagnosed with COPD or Emphysema. She is on albuterol inhaler and Singulair. -Her wound necrosis ispossiblyrelated to smoking -she has cut back, using nicotine patch. She was down to 5-6 cigarettes per day but went back to just under a pack  4. Comorbidities:DiscoidLupus, Vitiligo, Thyroid disease.  -She was diagnosed with Lupus 10 years  ago. She uses steroid cream as needed for her lupus rashes, some skin lesions of which have been removed. -She sees her  dermatologist Dr Nevada Crane and their [place].  -She is on 2 thyroid medications.  5. Social Support, Anxiety  -Ongoing -Startedonlow doseXanaxPRN5/07/2019, okay to take before chemo. -She previously declined social work referral  6.  Hearing loss and tinnitus -Tinnitus began after starting adjuvant chemo, ENT feels this is more a side effect of her hearing loss rather than chemo and recommends a hearing aid  Disposition: Ms. Bost appears stable.  She has completed 9 cycles of weekly Taxol and trastuzumab bio similar.  She tolerates treatment moderately well.  She has developed signs of early neuropathy, no functional difficulties or sensory loss. I recommend to continue cryotherapy with chemo and add B complex vitamin.  For changes in her bowel habits I recommend Imodium as needed.  She is otherwise able to recover and function well.  We reviewed the CBC and CMP from today.  Labs adequate to proceed with cycle 10 weekly taxol/trazimera. I am recommending to dose reduce Taxol to 60 mg/m2 due to neuropathy.  The plan was discussed with Dr. Burr Medico.  She will return for follow-up prior to next cycle to monitor her neuropathy and other treatment side effects.  All questions were answered. The patient knows to call the clinic with any problems, questions or concerns. No barriers to learning were detected.     Stephanie Feeling, NP 01/12/20

## 2020-01-12 ENCOUNTER — Encounter: Payer: Self-pay | Admitting: *Deleted

## 2020-01-12 ENCOUNTER — Inpatient Hospital Stay: Payer: BC Managed Care – PPO

## 2020-01-12 ENCOUNTER — Encounter: Payer: Self-pay | Admitting: Nurse Practitioner

## 2020-01-12 ENCOUNTER — Other Ambulatory Visit: Payer: Self-pay | Admitting: Medical

## 2020-01-12 ENCOUNTER — Ambulatory Visit (HOSPITAL_BASED_OUTPATIENT_CLINIC_OR_DEPARTMENT_OTHER): Payer: BC Managed Care – PPO | Admitting: Medical

## 2020-01-12 ENCOUNTER — Inpatient Hospital Stay (HOSPITAL_BASED_OUTPATIENT_CLINIC_OR_DEPARTMENT_OTHER): Payer: BC Managed Care – PPO | Admitting: Nurse Practitioner

## 2020-01-12 ENCOUNTER — Other Ambulatory Visit: Payer: Self-pay

## 2020-01-12 VITALS — BP 118/82 | HR 72 | Temp 97.0°F | Resp 20 | Ht 67.0 in | Wt 170.1 lb

## 2020-01-12 VITALS — BP 147/84 | HR 57 | Temp 97.9°F | Resp 18

## 2020-01-12 DIAGNOSIS — Z170 Estrogen receptor positive status [ER+]: Secondary | ICD-10-CM

## 2020-01-12 DIAGNOSIS — C50411 Malignant neoplasm of upper-outer quadrant of right female breast: Secondary | ICD-10-CM | POA: Diagnosis not present

## 2020-01-12 DIAGNOSIS — R0602 Shortness of breath: Secondary | ICD-10-CM

## 2020-01-12 DIAGNOSIS — T8090XA Unspecified complication following infusion and therapeutic injection, initial encounter: Secondary | ICD-10-CM

## 2020-01-12 DIAGNOSIS — J4530 Mild persistent asthma, uncomplicated: Secondary | ICD-10-CM

## 2020-01-12 DIAGNOSIS — Z5112 Encounter for antineoplastic immunotherapy: Secondary | ICD-10-CM | POA: Diagnosis not present

## 2020-01-12 LAB — CBC WITH DIFFERENTIAL (CANCER CENTER ONLY)
Abs Immature Granulocytes: 0.03 10*3/uL (ref 0.00–0.07)
Basophils Absolute: 0.1 10*3/uL (ref 0.0–0.1)
Basophils Relative: 1 %
Eosinophils Absolute: 0.1 10*3/uL (ref 0.0–0.5)
Eosinophils Relative: 1 %
HCT: 34.8 % — ABNORMAL LOW (ref 36.0–46.0)
Hemoglobin: 11.7 g/dL — ABNORMAL LOW (ref 12.0–15.0)
Immature Granulocytes: 0 %
Lymphocytes Relative: 29 %
Lymphs Abs: 2.1 10*3/uL (ref 0.7–4.0)
MCH: 29 pg (ref 26.0–34.0)
MCHC: 33.6 g/dL (ref 30.0–36.0)
MCV: 86.1 fL (ref 80.0–100.0)
Monocytes Absolute: 0.6 10*3/uL (ref 0.1–1.0)
Monocytes Relative: 8 %
Neutro Abs: 4.4 10*3/uL (ref 1.7–7.7)
Neutrophils Relative %: 61 %
Platelet Count: 282 10*3/uL (ref 150–400)
RBC: 4.04 MIL/uL (ref 3.87–5.11)
RDW: 15.3 % (ref 11.5–15.5)
WBC Count: 7.3 10*3/uL (ref 4.0–10.5)
nRBC: 0 % (ref 0.0–0.2)

## 2020-01-12 LAB — CMP (CANCER CENTER ONLY)
ALT: 23 U/L (ref 0–44)
AST: 24 U/L (ref 15–41)
Albumin: 3.7 g/dL (ref 3.5–5.0)
Alkaline Phosphatase: 91 U/L (ref 38–126)
Anion gap: 3 — ABNORMAL LOW (ref 5–15)
BUN: 9 mg/dL (ref 6–20)
CO2: 28 mmol/L (ref 22–32)
Calcium: 9.9 mg/dL (ref 8.9–10.3)
Chloride: 107 mmol/L (ref 98–111)
Creatinine: 0.66 mg/dL (ref 0.44–1.00)
GFR, Estimated: >60 mL/min (ref >60)
Glucose, Bld: 95 mg/dL (ref 70–99)
Potassium: 3.9 mmol/L (ref 3.5–5.1)
Sodium: 138 mmol/L (ref 135–145)
Total Bilirubin: 0.2 mg/dL — ABNORMAL LOW (ref 0.3–1.2)
Total Protein: 6.8 g/dL (ref 6.5–8.1)

## 2020-01-12 MED ORDER — DEXAMETHASONE SODIUM PHOSPHATE 10 MG/ML IJ SOLN
5.0000 mg | Freq: Once | INTRAMUSCULAR | Status: AC
  Administered 2020-01-12: 5 mg via INTRAVENOUS

## 2020-01-12 MED ORDER — HEPARIN SOD (PORK) LOCK FLUSH 100 UNIT/ML IV SOLN
500.0000 [IU] | Freq: Once | INTRAVENOUS | Status: AC | PRN
  Administered 2020-01-12: 500 [IU]
  Filled 2020-01-12: qty 5

## 2020-01-12 MED ORDER — SODIUM CHLORIDE 0.9 % IV SOLN
40.0000 mg | Freq: Once | INTRAVENOUS | Status: AC
  Administered 2020-01-12: 40 mg via INTRAVENOUS
  Filled 2020-01-12: qty 4

## 2020-01-12 MED ORDER — SODIUM CHLORIDE 0.9% FLUSH
10.0000 mL | INTRAVENOUS | Status: DC | PRN
  Administered 2020-01-12: 10 mL
  Filled 2020-01-12: qty 10

## 2020-01-12 MED ORDER — SODIUM CHLORIDE 0.9 % IV SOLN: 5.0000 mg | Freq: Once | INTRAVENOUS | Status: DC

## 2020-01-12 MED ORDER — ALBUTEROL SULFATE HFA 108 (90 BASE) MCG/ACT IN AERS
2.0000 | INHALATION_SPRAY | Freq: Once | RESPIRATORY_TRACT | Status: AC
  Administered 2020-01-12: 2 via RESPIRATORY_TRACT

## 2020-01-12 MED ORDER — SODIUM CHLORIDE 0.9 % IV SOLN
Freq: Once | INTRAVENOUS | Status: AC
  Filled 2020-01-12: qty 250

## 2020-01-12 MED ORDER — ALBUTEROL SULFATE HFA 108 (90 BASE) MCG/ACT IN AERS
INHALATION_SPRAY | RESPIRATORY_TRACT | Status: AC
  Filled 2020-01-12: qty 6.7

## 2020-01-12 MED ORDER — TRASTUZUMAB-QYYP CHEMO 420 MG IV SOLR
150.0000 mg | Freq: Once | INTRAVENOUS | Status: AC
  Administered 2020-01-12: 150 mg via INTRAVENOUS
  Filled 2020-01-12: qty 7.14

## 2020-01-12 MED ORDER — DEXAMETHASONE SODIUM PHOSPHATE 10 MG/ML IJ SOLN
INTRAMUSCULAR | Status: AC
  Filled 2020-01-12: qty 1

## 2020-01-12 MED ORDER — FLUTICASONE PROPIONATE HFA 110 MCG/ACT IN AERO: 2.0000 | INHALATION_SPRAY | Freq: Two times a day (BID) | RESPIRATORY_TRACT | 12 refills | Status: DC

## 2020-01-12 MED ORDER — SODIUM CHLORIDE 0.9 % IV SOLN
60.0000 mg/m2 | Freq: Once | INTRAVENOUS | Status: AC
  Administered 2020-01-12: 108 mg via INTRAVENOUS
  Filled 2020-01-12: qty 18

## 2020-01-12 MED ORDER — ACETAMINOPHEN 325 MG PO TABS
ORAL_TABLET | ORAL | Status: AC
  Filled 2020-01-12: qty 2

## 2020-01-12 MED ORDER — DIPHENHYDRAMINE HCL 50 MG/ML IJ SOLN
INTRAMUSCULAR | Status: AC
  Filled 2020-01-12: qty 1

## 2020-01-12 MED ORDER — ACETAMINOPHEN 325 MG PO TABS
650.0000 mg | ORAL_TABLET | Freq: Once | ORAL | Status: AC
  Administered 2020-01-12: 650 mg via ORAL

## 2020-01-12 MED ORDER — SODIUM CHLORIDE 0.9 % IV SOLN
10.0000 mg | Freq: Once | INTRAVENOUS | Status: AC
  Administered 2020-01-12: 10 mg via INTRAVENOUS
  Filled 2020-01-12: qty 10

## 2020-01-12 MED ORDER — FAMOTIDINE IN NACL 20-0.9 MG/50ML-% IV SOLN
INTRAVENOUS | Status: AC
  Filled 2020-01-12: qty 50

## 2020-01-12 MED ORDER — DIPHENHYDRAMINE HCL 50 MG/ML IJ SOLN
25.0000 mg | Freq: Once | INTRAMUSCULAR | Status: AC
  Administered 2020-01-12: 25 mg via INTRAVENOUS

## 2020-01-12 NOTE — Patient Instructions (Signed)
[facility] Discharge Instructions for Patients Receiving Chemotherapy  Today you received the following chemotherapy agents: Transtuzumab and Paclitaxel (Taxol)  To help prevent nausea and vomiting after your treatment, we encourage you to take your nausea medication as directed by your MD.   If you develop nausea and vomiting that is not controlled by your nausea medication, call the clinic.   BELOW ARE SYMPTOMS THAT SHOULD BE REPORTED IMMEDIATELY:  *FEVER GREATER THAN 100.5 F  *CHILLS WITH OR WITHOUT FEVER  NAUSEA AND VOMITING THAT IS NOT CONTROLLED WITH YOUR NAUSEA MEDICATION  *UNUSUAL SHORTNESS OF BREATH  *UNUSUAL BRUISING OR BLEEDING  TENDERNESS IN MOUTH AND THROAT WITH OR WITHOUT PRESENCE OF ULCERS  *URINARY PROBLEMS  *BOWEL PROBLEMS  UNUSUAL RASH Items with * indicate a potential emergency and should be followed up as soon as possible.  Feel free to call the clinic should you have any questions or concerns. The clinic phone number is [phone].  Please show the [facility] at check-in to the Emergency Department and triage nurse.

## 2020-01-12 NOTE — Progress Notes (Signed)
At 1645, during taxol infusion patient began to complain of SOB. Infusion was paused, and fluids were ran wide open.  Lucianne Lei tanner called to chair, see MAR.  Patients symptoms resolved. Vital signs remained stable. Per Lucianne Lei, OK to proceed with infusion. Will continue to monitor.

## 2020-01-13 ENCOUNTER — Encounter: Payer: Self-pay | Admitting: Nurse Practitioner

## 2020-01-13 ENCOUNTER — Ambulatory Visit: Payer: BC Managed Care – PPO

## 2020-01-13 DIAGNOSIS — M25511 Pain in right shoulder: Secondary | ICD-10-CM

## 2020-01-13 DIAGNOSIS — M25512 Pain in left shoulder: Secondary | ICD-10-CM

## 2020-01-13 DIAGNOSIS — C50411 Malignant neoplasm of upper-outer quadrant of right female breast: Secondary | ICD-10-CM | POA: Diagnosis not present

## 2020-01-13 DIAGNOSIS — Z170 Estrogen receptor positive status [ER+]: Secondary | ICD-10-CM

## 2020-01-13 DIAGNOSIS — R293 Abnormal posture: Secondary | ICD-10-CM

## 2020-01-13 DIAGNOSIS — M25611 Stiffness of right shoulder, not elsewhere classified: Secondary | ICD-10-CM

## 2020-01-13 DIAGNOSIS — M25612 Stiffness of left shoulder, not elsewhere classified: Secondary | ICD-10-CM

## 2020-01-13 NOTE — Therapy (Signed)
[place], [place], [address] Phone: [phone]   Fax:  [phone]  Physical Therapy Treatment  Patient Details  Name: Stephanie Chase MRN: [id number] Date of Birth: 04/26/1960 Referring Provider (PT): Dr. Stark Klein   Encounter Date: 01/13/2020   PT End of Session - 01/13/20 0906    Visit Number 15    Number of Visits 22    Date for PT Re-Evaluation 01/13/20    PT Start Time 0811    PT Stop Time 0906    PT Time Calculation (min) 55 min    Activity Tolerance Patient tolerated treatment well    Behavior During Therapy [facility] for tasks assessed/performed           Past Medical History:  Diagnosis Date  . Anemia   . Anxiety   . Asthma   . Breast cancer ([place])   . COPD (chronic obstructive pulmonary disease) ([facility])   . Discoid lupus   . Family history of bladder cancer   . Family history of BRCA gene mutation   . Family history of breast cancer   . Family history of prostate cancer   . GERD (gastroesophageal reflux disease)   . Hypothyroidism   . Pneumonia   . Thyroid disease     Past Surgical History:  Procedure Laterality Date  . ABLATION ON ENDOMETRIOSIS    . APPLICATION OF A-CELL OF EXTREMITY Right 10/22/2019   Procedure: EXCISION OF RIGHT BREAST WOUND WITH PRIMARY CLOSURE;  Surgeon: [name] Going, DO;  Location: [facility];  Service: Plastics;  Laterality: Right;  . BREAST LUMPECTOMY    . DEBRIDEMENT AND CLOSURE WOUND Right 10/22/2019   Procedure: Excision of right breast wound;  Surgeon: [name] Going, DO;  Location: [facility];  Service: Plastics;  Laterality: Right;  . MASTECTOMY W/ SENTINEL NODE BIOPSY Bilateral 09/05/2019   Procedure: BILATERAL MASTECTOMY WITH RIGHT SENTINEL LYMPH NODE BIOPSY;  Surgeon: Stark Klein, MD;  Location: [facility];  Service: General;  Laterality: Bilateral;  COMBINED WITH REGIONAL FOR POST OP PAIN  . PORTACATH PLACEMENT Left 09/05/2019   Procedure:  INSERTION PORT-A-CATH WITH ULTRASOUND GUIDANCE;  Surgeon: Stark Klein, MD;  Location: [facility];  Service: General;  Laterality: Left;  . TONSILLECTOMY    . TUBAL LIGATION    . WISDOM TOOTH EXTRACTION      There were no vitals filed for this visit.   Subjective Assessment - 01/13/20 0817    Subjective I had another infusion yesterday and so only got about 3.5 hours sleep last night. I've noticed when sitting at times I've started having SOB intermittently in the past 2 weeks. I've mentioned it to the doctor but they say it's not from my chemo but I don't know how it can't be since it's so new.    Pertinent History Bil masectomy 09/05/19 with 3 lymph nodes removed and negative on the R and benign tisuse on the L. Patient was diagnosed on 06/09/2019 with right grade III invasive ductal carcinoma breast cancer. It mesures 1.3 cm in the upper outer quadrant with 1.6 cm of calcifications and DCIS. It is ER positive, PR negative, and HER2 positive with a Ki67 of 50%. She smokes about 1 pack/day.    Patient Stated Goals I want to get back to kayaking.    Currently in Pain? No/denies  [facility] Adult PT Treatment/Exercise - 01/13/20 0001      Exercises   Exercises Other Exercises    Other Exercises  Started instructing pt in Strength ABC Program working through all stretches.      Shoulder Exercises: Pulleys   Flexion 2 minutes    Flexion Limitations Pt with good technique today    ABduction 2 minutes    ABduction Limitations Good stretch reported      Shoulder Exercises: Therapy Ball   Flexion Both;10 reps   forward lean into end of stretch   Flexion Limitations VCa to remind pt to relax shoulders    ABduction Right;Left;10 reps   same side lean into end of stretch     Manual Therapy   Myofascial Release To bil axillae at end P/ROM and at chest walls    Passive ROM In Supine to bil shoulders into flexion and abduction                     PT Short Term Goals - 12/16/19 1005      PT SHORT TERM GOAL #1   Title Pt will be independent with HEP and Modified MLD for the anterior trunk within 2 weeks in order to demonstrate autonomy of care.    Baseline pt provided with HEP today; pt independent with this now-12/16/19    Status Achieved             PT Long Term Goals - 12/16/19 0855      PT LONG TERM GOAL #1   Title Pt will demonstrate 150 degrees bil shoulder flexion and abduction within 4 weeks to demonstrate return to pre-operative AROM.    Baseline R shoulder flexion: 110, abduction: 69 L shoulder flexion: 118, abduction:  77; approximate AA/ROM with pulleys Rt 120 and abduction 140 degrees, Lt 150 degrees flexion and abduction (forgot to measure but this is visibly much improved since last measured)-11/13/19; Rt flex149, abd 152 with discomfort and pulling reported at top of shoulder and Lt flex 154, abd 170 - 12/16/19    Status Partially Met      PT LONG TERM GOAL #2   Title Pt will report 75% improvement in functional mobility and pain in order to demonstrate an improvement in subjective quality of life.    Baseline 2/10 pain, decresaed ROM below shoulder height. MOdifications with bathing/dressing; pt reports 50% improvement at this time as she is still limited with vaccuuming, lifting and reaching above head-11/13/19; pt reports 75-80% improvement at this time-12/16/19    Status Achieved      PT LONG TERM GOAL #3   Title Pt to be independent with HEP for improved A/ROM for bil shoulders and bil UE and postural strength to improve ease with ADLs.    Baseline No current HEP though began this today as she now has clearance for ROM from surgeon-11/13/19; pt is independenet with HEP thus far and progressed this today-12/16/19    Status On-going                 Plan - 01/13/20 0907    Clinical Impression Statement Progressed pt today by beginning instruction of Strength ABC Program. She was able to return good demo of  these anbd reported feeling good stretches. She will benefit from last session to complete instruction of this. Pt would then like to be placed on hold for a few weeks vs D/C in case Rt chest wall tightness that isn't completely resolved gets worse, she'll  be able to return. Did encourage her today to be consistent with stretches she has been instructed in as this will contiue to decrease residual tightness from surgery. She verbalized understanding.    Personal Factors and Comorbidities Comorbidity 1    Comorbidities bil masectomy with 3 lymph node removal on the R    Stability/Clinical Decision Making Stable/Uncomplicated    Rehab Potential Good    PT Frequency 1x / week    PT Duration 4 weeks    PT Treatment/Interventions Therapeutic activities;Therapeutic exercise;Neuromuscular re-education;Manual techniques;Cryotherapy;Moist Heat    PT Next Visit Plan Complete instruction of Strength ABC Program starting with core exercises and plan to place pt on hold after next session for a few weeks, then D/C if pt managing symptoms well/independently at home.    PT Home Exercise Plan Standing dowel exercises, supine scapular series; bil shoulder isometrics           Patient will benefit from skilled therapeutic intervention in order to improve the following deficits and impairments:  Decreased knowledge of precautions, Pain, Decreased range of motion, Increased edema  Visit Diagnosis: Malignant neoplasm of upper-outer quadrant of right breast in female, estrogen receptor positive (HCC)  Stiffness of left shoulder, not elsewhere classified  Stiffness of right shoulder, not elsewhere classified  Acute pain of right shoulder  Acute pain of left shoulder  Abnormal posture     Problem List Patient Active Problem List   Diagnosis Date Noted  . Acquired absence of breast and absent nipple, bilateral 10/07/2019  . Breast wound, right, initial encounter 10/07/2019  . Breast cancer of  upper-outer quadrant of right female breast ([facility]) 09/05/2019  . Genetic testing 08/12/2019  . Family history of BRCA gene mutation 07/31/2019  . Family history of breast cancer   . Family history of prostate cancer   . Family history of bladder cancer   . Malignant neoplasm of upper-outer quadrant of right breast in female, estrogen receptor positive ([facility]) 07/24/2019  . Annual physical exam 03/31/2016  . Occupational bronchitis ([facility]) 03/03/2015  . Hyperlipidemia 03/30/2014  . 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 Generalized osteoarthritis 02/23/2012  . Plantar fasciitis, bilateral 02/23/2012  . Vitiligo 02/23/2012  . Difficulty hearing 02/13/2012  . History of lupus 12/21/2011  . Vitamin D deficiency 10/11/2010  . Hypothyroidism 06/21/2010  . Systemic lupus erythematosus ([facility]) 06/14/2010    Otelia Limes, PTA 01/13/2020, 10:51 AM  [place], [place], [address] Phone: [phone]   Fax:  [phone]  Name: Stephanie Chase MRN: [id number] Date of Birth: April 08, 1960

## 2020-01-15 ENCOUNTER — Encounter: Payer: Self-pay | Admitting: General Practice

## 2020-01-15 NOTE — Progress Notes (Signed)
[facility] CSW Progress Notes  Called patient at request of nurse navigator.  Patient admits to significant anxiety which has increased as she has progressed through treatment, rather than decreased.  She struggles w insomnia - particularly on the night after chemotherapy.  Gets approx 3 -4 hours/sleep.  Other nights she is awakening multiple times/night. This is a significant change for her.  She is also experiencing worry about intermittent shortness of breath - is not sure whether this is related to medications used to treat her cancer or whether it needs to addressed w her PCP as it is unrelated to cancer.  Overall, she is wondering whether her life will return to her previous state of health when she finished treatment - she was a fit and active individual who liked to kayak, paddleboard and hike.  SOB is a significant barrier to being physically active.  She is also concerned about her mother who recently developed a "blood cancer" which might be related to previous treatment for breast cancer in years past.  Patient worries that drugs used to treat her cancer might lead to development of a blood cancer in later life.  She has limited local support - daughters and grandchildren live in [place], other family members live in [place] or [place].  She does have a significant other in [place] and is continuing to try to work from home.  She does find that her concentration and cognition have been affected by cancer and cancer treatment - she feels she is not as "sharp" as she used to be which can impact her job which requires concentration and focus.    CSW discussed the need to treat the symptom of anxiety and also to determine the etiology of the shortness of breath, if possible. She will discuss her concerns w her oncologist and CSW will also brief oncologist on these concerns. CSW will sent her information on program available through the [facility] - encouraged her to consider options like yoga  or tai chi, both of which promote mindfulness.  She will also be referred to the [facility] Counseling intern for work on Sports administrator.  CSW will call her next week to assess progress.  Edwyna Shell, LCSW Clinical Social Worker Phone:  [phone]

## 2020-01-15 NOTE — Progress Notes (Signed)
[facility] CSW Progress Notes  Call to patient to assess for needs/provide support at request of breast nurse navigator.  Unable to reach, left VM w my contact information and request to call back.    Edwyna Shell, LCSW Clinical Social Worker Phone:  [phone]

## 2020-01-15 NOTE — Progress Notes (Signed)
DATE:  01/12/2020                                          X  CHEMO/IMMUNOTHERAPY REACTION           MD:  Dr. Truitt Merle   AGENT/BLOOD PRODUCT RECEIVING TODAY:                  Taxol and Trazimera   AGENT/BLOOD PRODUCT RECEIVING IMMEDIATELY PRIOR TO REACTION:    Taxol   VS: BP:     140/91   P:       69       SPO2:       100 % on room air  T: 97.7                  REACTION(S):           Shortness of breath, chest tightness    PREMEDS:      Tylenol, Benadryl 25 mg IV, Decadron 10 mg IV and Pepcid 40 mg IV   INTERVENTION: Taxol was paused and the patient was given Decadron 5 mg IV x 1 and albuterol inhaler 2 puffs x 1   Review of Systems  Review of Systems  Constitutional: Negative for chills, diaphoresis and fever.  HENT: Negative for trouble swallowing and voice change.   Respiratory: Positive for chest tightness and shortness of breath. Negative for cough, choking, wheezing and stridor.   Cardiovascular: Negative for chest pain and palpitations.  Gastrointestinal: Negative for abdominal pain, constipation, diarrhea, nausea and vomiting.  Musculoskeletal: Negative for back pain and myalgias.  Neurological: Negative for dizziness, light-headedness and headaches.     Physical Exam  Physical Exam Constitutional:      General: She is not in acute distress.    Appearance: She is not diaphoretic.  HENT:     Head: Normocephalic and atraumatic.  Cardiovascular:     Rate and Rhythm: Normal rate and regular rhythm.     Heart sounds: Normal heart sounds. No murmur heard.  No friction rub. No gallop.   Pulmonary:     Effort: Pulmonary effort is normal. No respiratory distress.     Breath sounds: No stridor. Examination of the right-upper field reveals decreased breath sounds. Examination of the left-upper field reveals decreased breath sounds. Examination of the right-middle field reveals decreased breath sounds. Examination of the left-middle field reveals decreased breath sounds.  Examination of the right-lower field reveals decreased breath sounds. Examination of the left-lower field reveals decreased breath sounds. Decreased breath sounds present. No wheezing or rales.  Skin:    General: Skin is warm and dry.     Coloration: Skin is not pale.     Findings: No erythema.  Neurological:     Mental Status: She is alert.  Psychiatric:        Behavior: Behavior normal.        Thought Content: Thought content normal.        Judgment: Judgment normal.     OUTCOME:                 The patient has a history of asthma by her report and has an albuterol inhaler at home that she uses intermittently.  It is possible that her reaction today was not truly a reaction to chemotherapy and could possibly simply have been her asthma.  Her symptoms abated after the above intervention and she was able to complete the remainder of her Taxol without any further issues of concern.  A prescription for a Flovent inhaler was sent to her pharmacy.  She was told to use this regularly and to use her albuterol inhaler as needed.   Sandi Mealy, MHS, PA-C

## 2020-01-16 ENCOUNTER — Telehealth: Payer: Self-pay

## 2020-01-16 ENCOUNTER — Telehealth: Payer: Self-pay | Admitting: Radiology

## 2020-01-16 NOTE — Progress Notes (Signed)
[facility]   [phone] [phone]   Clinic Follow up Note   Patient Care Team: Delsa Bern, MD as PCP - General (Obstetrics and Gynecology) Richrd Prime as Consulting Physician (Obstetrics and Gynecology) Mauro Kaufmann, RN as Oncology Nurse Navigator Rockwell Germany, RN as Oncology Nurse Navigator Stark Klein, MD as Consulting Physician (General Surgery) Truitt Merle, MD as Consulting Physician (Hematology) Kyung Rudd, MD as Consulting Physician (Radiation Oncology) Register, Luetta Nutting, PA-C as Physician Assistant (Dermatology) [name] Going, DO as Attending Physician (Plastic Surgery)  Date of Service:  01/19/2020  CHIEF COMPLAINT: F/u of right breast cancer  SUMMARY OF ONCOLOGIC HISTORY: Oncology History Overview Note  Cancer Staging Malignant neoplasm of upper-outer quadrant of right breast in female, estrogen receptor positive ([place]) Staging form: Breast, AJCC 8th Edition - Clinical stage from 07/30/2019: Stage IA (cT1c, cN0, cM0, G3, ER+, PR-, HER2+) - Unsigned    Malignant neoplasm of upper-outer quadrant of right breast in female, estrogen receptor positive ([place])  07/03/2019 Mammogram   Diagnostic Mammogram  IMPRESSION There is a 1.3x1.1cm focal asymmetry in the retroareolar anterior to middle depth right breast 2.2cm from the nipple.  There is a 2 cm focal asymmetry with appearance in the upper outer quadrant posterior depth of right breast 3 cm from nipple -Both suspicious and biopsy recommended.     07/18/2019 Initial Biopsy   Diagnosis 07/18/19 1. Breast, right, needle core biopsy, 12 o'clock - INVASIVE DUCTAL CARCINOMA. 2. Breast, right, needle core biopsy, upper outer quadrant - DUCTAL CARCINOMA IN SITU. Microscopic Comment 1. The invasive carcinoma is nuclear grade 3. The greatest linear extent of tumor in any one core is 11 mm. Ancillary studies will be reported separately. CKAE1AE3, GATA-3, ER and E-cadherin are  positive. PAX 8 is negative. CK7 is noncontributory. 2. The in situ carcinoma is high nuclear grade with central necrosis and calcifications. E-cadherin is positive. P63, Calponin and SMM-1 demonstrate the present of myoepithelium.   07/18/2019 Receptors her2   1. PROGNOSTIC INDICATORS 07/18/19 Results: IMMUNOHISTOCHEMICAL AND MORPHOMETRIC ANALYSIS PERFORMED MANUALLY The tumor cells are POSITIVE for Her2 (3+). Of note, the tumor shows heterogeneity in regards to Her2 expression. Estrogen Receptor: 95%, POSITIVE, STRONG STAINING INTENSITY Progesterone Receptor: 0%, NEGATIVE Proliferation Marker Ki67: 50%   07/24/2019 Initial Diagnosis   Malignant neoplasm of upper-outer quadrant of right breast in female, estrogen receptor positive ([place])   08/01/2019 Breast MRI   IMPRESSION: 1. Known invasive ductal carcinoma measures 1.2 centimeters in the 12 o'clock location. 2. Non mass enhancement measures 2.3 centimeters in the LATERAL portion of the RIGHT breast corresponding to DCIS on biopsy. 3. An additional mass in the UPPER OUTER QUADRANT of the RIGHT breast is 1.5 centimeters and warrants tissue diagnosis. This area correlates with distortion seen mammographically. 4. No axillary adenopathy or ancillary findings. LEFT breast is negative.     08/04/2019 Echocardiogram   IMPRESSIONS     1. Left ventricular ejection fraction, by estimation, is 65 to 70%. The  left ventricle has normal function. The left ventricle has no regional  wall motion abnormalities. Left ventricular diastolic parameters were  normal. The average left ventricular  global longitudinal strain is -20.4 %.   2. Right ventricular systolic function is normal. The right ventricular  size is normal.   3. The mitral valve is normal in structure. No evidence of mitral valve  regurgitation. No evidence of mitral stenosis.   4. The aortic valve is normal in structure. Aortic valve regurgitation is  not visualized. No aortic  stenosis is present.    08/10/2019 Genetic Testing   Negative genetic testing:  No pathogenic variants detected on the [facility] Breast Cancer STAT panel + Common Hereditary Cancers panel. A variant of uncertain significance (VUS) was detected in the ATM gene called c.3834C>A. The report date is 08/10/2019.  The Breast Cancer STAT Panel offered by [facility] includes sequencing and deletion/duplication analysis for the following 9 genes:  ATM, BRCA1, BRCA2, CDH1, CHEK2, PALB2, PTEN, STK11 and TP53. The Common Hereditary Cancers Panel offered by [facility] includes sequencing and/or deletion duplication testing of the following 48 genes: APC, ATM, AXIN2, BARD1, BMPR1A, BRCA1, BRCA2, BRIP1, CDH1, CDK4, CDKN2A (p14ARF), CDKN2A (p16INK4a), CHEK2, CTNNA1, DICER1, EPCAM (Deletion/duplication testing only), GREM1 (promoter region deletion/duplication testing only), KIT, MEN1, MLH1, MSH2, MSH3, MSH6, MUTYH, NBN, NF1, NHTL1, PALB2, PDGFRA, PMS2, POLD1, POLE, PTEN, RAD50, RAD51C, RAD51D, RNF43, SDHB, SDHC, SDHD, SMAD4, SMARCA4. STK11, TP53, TSC1, TSC2, and VHL.  The following genes were evaluated for sequence changes only: SDHA and HOXB13 c.251G>A variant only.    09/05/2019 Surgery   BILATERAL MASTECTOMY WITH RIGHT SENTINEL LYMPH NODE BIOPSY and PAC placement by Dr Barry Dienes    09/05/2019 Pathology Results   FINAL MICROSCOPIC DIAGNOSIS:   A. BREAST, LEFT, MASTECTOMY:  - Benign breast tissue.   B. BREAST, RIGHT, MASTECTOMY:  - Invasive ductal carcinoma, multifocal, 1.4 cm in greatest dimension,  [place] grade 3 of 3.  - Ductal carcinoma in situ, high nuclear grade with central necrosis and  calcifications.  - Margins of resection are not involved.  - Biopsy sites.  - See oncology table.   C. SENTINEL LYMPH NODE, RIGHT AXILLARY #1, BIOPSY:  - One lymph node, negative for carcinoma (0/1).   D. SENTINEL LYMPH NODE, RIGHT AXILLARY #2, BIOPSY:  - One lymph node, negative for carcinoma (0/1).   E. SENTINEL LYMPH  NODE, RIGHT AXILLARY #3, BIOPSY:  - One lymph node, negative for carcinoma (0/1).     Estrogen Receptor: 95%, positive, strong             Progesterone Receptor: 0%, negative             HER2: Positive (3+)             Ki-67: 50%    09/05/2019 Cancer Staging   Staging form: Breast, AJCC 8th Edition - Pathologic stage from 09/05/2019: Stage IA (pT1c, pN0, cM0, G3, ER+, PR-, HER2+) - Signed by Gardenia Phlegm, NP on 09/17/2019   10/22/2019 Surgery   Excision of right breast wound and APPLICATION OF A-CELL, INTEGRA BWM OR CELLERATE by Dr Marla Roe on 10/22/19   10/27/2019 -  Chemotherapy   Weekly Taxol and Herceptin for 12 weeks starting 10/27/19 then maintenance Herceptin q3weeks to complete 1 year treatment. Given recent right breast infection and debridement, will start Herceptin on 10/27/19 with loading dose (q2weeks) and add Taxol with week 3 on 11/10/19 then continuing with both weekly.  -Due to tinnitus, her Kanjinti (Herceptin) was changed to weekly Trazimera on 11/24/19.       CURRENT THERAPY:  Weekly Taxol and Trazimerafor12 weeksstarting 8/2/21then maintenance Trazimeraq3weeksto complete 1 yeartreatment.Taxol started on 11/10/19.   INTERVAL HISTORY:  Stephanie Chase is here for a follow up. She presents to the clinic alone. She notes having occasional SOB and last cycle was the first time it happened during infusion. She has required inhaler. She has SOB episodes 2-3 times a week. She notes before chemo she was diagnosed with asthma and may use  her inhaler 1 every 2-3 weeks. Now since chemo she is not as active and has more flares of SOB. She also notes increasing back pain which is new since chemo. The 5/10 nearly constant pain in her in her upper back and lower back. She notes her Robaxin and Tylenol has not helped. She has left over oxycodone from surgery has not helped her pain. She had a endometrial ablation 15 years ago and has not had a period since she note she did  experience some hot flashes and her Gyn did hormonal testing that shows she is postmenopausal. She notes she started having Neuropathy, mainly in her feet but also present in her hands. She denies decreased function and she is still able to ambulate well. She is using ice bags during treatment. She notes having dry eye more in left eye than right. She notes her vision is still adequate until     REVIEW OF SYSTEMS:   Constitutional: Denies fevers, chills or abnormal weight loss Eyes: Denies blurriness of vision (+) Dry eyes  Ears, nose, mouth, throat, and face: Denies mucositis or sore throat Respiratory: Denies cough or wheezes (+) Intermittent SOB  Cardiovascular: Denies palpitation, chest discomfort or lower extremity swelling Gastrointestinal:  Denies nausea, heartburn or change in bowel habits Skin: Denies abnormal skin rashes MSK: (+) upper and lower back pain  Lymphatics: Denies new lymphadenopathy or easy bruising Neurological: (+) Neuropathy in feet> hands  Behavioral/Psych: Mood is stable, no new changes  All other systems were reviewed with the patient and are negative.  MEDICAL HISTORY:  Past Medical History:  Diagnosis Date  . Anemia   . Anxiety   . Asthma   . Breast cancer ([place])   . COPD (chronic obstructive pulmonary disease) ([place])   . Discoid lupus   . Family history of bladder cancer   . Family history of BRCA gene mutation   . Family history of breast cancer   . Family history of prostate cancer   . GERD (gastroesophageal reflux disease)   . Hypothyroidism   . Pneumonia   . Thyroid disease     SURGICAL HISTORY: Past Surgical History:  Procedure Laterality Date  . ABLATION ON ENDOMETRIOSIS    . APPLICATION OF A-CELL OF EXTREMITY Right 10/22/2019   Procedure: EXCISION OF RIGHT BREAST WOUND WITH PRIMARY CLOSURE;  Surgeon: [name] Going, DO;  Location: [facility];  Service: Plastics;  Laterality: Right;  . BREAST LUMPECTOMY    . DEBRIDEMENT AND CLOSURE WOUND  Right 10/22/2019   Procedure: Excision of right breast wound;  Surgeon: [name] Going, DO;  Location: [facility];  Service: Plastics;  Laterality: Right;  . MASTECTOMY W/ SENTINEL NODE BIOPSY Bilateral 09/05/2019   Procedure: BILATERAL MASTECTOMY WITH RIGHT SENTINEL LYMPH NODE BIOPSY;  Surgeon: Stark Klein, MD;  Location: [facility];  Service: General;  Laterality: Bilateral;  COMBINED WITH REGIONAL FOR POST OP PAIN  . PORTACATH PLACEMENT Left 09/05/2019   Procedure: INSERTION PORT-A-CATH WITH ULTRASOUND GUIDANCE;  Surgeon: Stark Klein, MD;  Location: [facility];  Service: General;  Laterality: Left;  . TONSILLECTOMY    . TUBAL LIGATION    . WISDOM TOOTH EXTRACTION      I have reviewed the social history and family history with the patient and they are unchanged from previous note.  ALLERGIES:  is allergic to bactrim [sulfamethoxazole-trimethoprim].  MEDICATIONS:  Current Outpatient Medications  Medication Sig Dispense Refill  . acetaminophen (TYLENOL) 500 MG tablet Take 500-1,000 mg by mouth every 6 (  six) hours as needed for moderate pain.     Marland Kitchen albuterol (VENTOLIN HFA) 108 (90 Base) MCG/ACT inhaler Inhale 2 puffs into the lungs every 6 (six) hours as needed for wheezing or shortness of breath. 8 g 2  . ALPRAZolam (XANAX) 0.25 MG tablet Take 1 tablet (0.25 mg total) by mouth daily as needed for anxiety. 30 tablet 0  . Ascorbic Acid (VITAMIN C) 1000 MG tablet Take 1,000 mg by mouth daily.    . cetirizine (ZYRTEC) 10 MG tablet Take 10 mg by mouth daily as needed for allergies.     . Cholecalciferol (VITAMIN D) 50 MCG (2000 UT) tablet Take 2,000 Units by mouth daily.    Marland Kitchen Dexlansoprazole (DEXILANT) 30 MG capsule Take 1 capsule (30 mg total) by mouth daily. 30 capsule 1  . dextromethorphan (DELSYM) 30 MG/5ML liquid Take 60 mg by mouth 2 (two) times daily as needed for cough.     . fluticasone (FLONASE) 50 MCG/ACT nasal spray Place 1 spray into both nostrils daily as needed for allergies.     .  fluticasone (FLONASE) 50 MCG/ACT nasal spray Place 2 sprays into both nostrils in the morning and at bedtime. 16 g 5  . fluticasone (FLOVENT HFA) 110 MCG/ACT inhaler Inhale 2 puffs into the lungs in the morning and at bedtime. 1 each 12  . levothyroxine (SYNTHROID) 75 MCG tablet Take 75 mcg by mouth daily before breakfast.    . lidocaine-prilocaine (EMLA) cream Apply 1 application topically as needed. (Patient taking differently: Apply 1 application topically daily as needed (port access). ) 30 g 1  . liothyronine (CYTOMEL) 5 MCG tablet TAKE 2 TABLETS (=10MCG     TOTAL)     DAILY (Patient taking differently: Take 10 mcg by mouth daily. ) 180 tablet 1  . methocarbamol (ROBAXIN) 500 MG tablet Take 1 tablet (500 mg total) by mouth every 6 (six) hours as needed (use for muscle cramps/pain). 20 tablet 1  . Multiple Vitamin (MULTIVITAMIN WITH MINERALS) TABS tablet Take 1 tablet by mouth 2 (two) times daily.     . nicotine (NICODERM CQ - DOSED IN MG/24 HOURS) 21 mg/24hr patch Place 21 mg onto the skin daily.    Marland Kitchen nystatin (MYCOSTATIN) 100000 UNIT/ML suspension Take 5 mLs (500,000 Units total) by mouth 4 (four) times daily. 473 mL 1  . ondansetron (ZOFRAN) 8 MG tablet Take 1 tablet (8 mg total) by mouth 2 (two) times daily as needed (Nausea or vomiting). 30 tablet 1  . OVER THE COUNTER MEDICATION Take 1 each by mouth 2 (two) times daily as needed (anxiety / sleep). CBD Gummies    . oxyCODONE (OXY IR/ROXICODONE) 5 MG immediate release tablet Take 1 tablet (5 mg total) by mouth every 6 (six) hours as needed for breakthrough pain. 20 tablet 0  . pantoprazole (PROTONIX) 40 MG tablet Take 1 tablet (40 mg total) by mouth daily. 30 tablet 2  . prochlorperazine (COMPAZINE) 10 MG tablet Take 1 tablet (10 mg total) by mouth every 6 (six) hours as needed (Nausea or vomiting). 30 tablet 1   No current facility-administered medications for this visit.   Facility-Administered Medications Ordered in Other Visits   Medication Dose Route Frequency Provider Last Rate Last Admin  . alum & mag hydroxide-simeth (MAALOX/MYLANTA) 200-200-20 MG/5ML suspension 30 mL  30 mL Oral Once Harle Stanford., PA-C        PHYSICAL EXAMINATION: ECOG PERFORMANCE STATUS: 2 - Symptomatic, <50% confined to bed  Vitals:  01/19/20 0838  BP: (!) 133/97  Pulse: 87  Resp: 18  Temp: (!) 97.3 F (36.3 C)  SpO2: 97%   Filed Weights   01/19/20 0838  Weight: 168 lb 8 oz (76.4 kg)    GENERAL:alert, no distress and comfortable SKIN: skin color, texture, turgor are normal, no rashes or significant lesions EYES: normal, Conjunctiva are pink and non-injected, sclera clear  NECK: supple, thyroid normal size, non-tender, without nodularity LYMPH:  no palpable lymphadenopathy in the cervical, axillary  LUNGS: clear to auscultation and percussion (+) Decreased breath sounds  HEART: regular rate & rhythm and no murmurs and no lower extremity edema ABDOMEN:abdomen soft, non-tender and normal bowel sounds Musculoskeletal:no cyanosis of digits and no clubbing  NEURO: alert & oriented x 3 with fluent speech, Minimal sensory deficits  LABORATORY DATA:  I have reviewed the data as listed CBC Latest Ref Rng & Units 01/19/2020 01/12/2020 01/06/2020  WBC 4.0 - 10.5 K/uL 10.3 7.3 7.5  Hemoglobin 12.0 - 15.0 g/dL 12.5 11.7(L) 12.6  Hematocrit 36 - 46 % 37.1 34.8(L) 37.9  Platelets 150 - 400 K/uL 339 282 299     CMP Latest Ref Rng & Units 01/19/2020 01/12/2020 01/06/2020  Glucose 70 - 99 mg/dL 89 95 87  BUN 6 - 20 mg/dL '12 9 10  ' Creatinine 0.44 - 1.00 mg/dL 0.71 0.66 0.67  Sodium 135 - 145 mmol/L 136 138 139  Potassium 3.5 - 5.1 mmol/L 4.1 3.9 4.2  Chloride 98 - 111 mmol/L 105 107 106  CO2 22 - 32 mmol/L '25 28 27  ' Calcium 8.9 - 10.3 mg/dL 10.0 9.9 10.0  Total Protein 6.5 - 8.1 g/dL 6.9 6.8 7.1  Total Bilirubin 0.3 - 1.2 mg/dL 0.3 0.2(L) 0.2(L)  Alkaline Phos 38 - 126 U/L 90 91 98  AST 15 - 41 U/L '19 24 24  ' ALT 0 - 44 U/L '17 23  24      ' RADIOGRAPHIC STUDIES: I have personally reviewed the radiological images as listed and agreed with the findings in the report. DG Chest 2 View  Result Date: 01/19/2020 CLINICAL DATA:  Dyspnea, breast cancer EXAM: CHEST - 2 VIEW COMPARISON:  Portable exam of 09/05/2019 FINDINGS: LEFT subclavian Port-A-Cath with tip projecting over SVC. Normal heart size, mediastinal contours, and pulmonary vascularity. Emphysematous bronchitic changes consistent with COPD. No pulmonary infiltrate, pleural effusion, or pneumothorax. Post BILATERAL mastectomy. Osseous structures unremarkable. IMPRESSION: COPD changes without acute abnormalities. Electronically Signed   By: Lavonia Dana M.D.   On: 01/19/2020 13:47     ASSESSMENT & PLAN:  Gagandeep Kossman is a 59 y.o. female with   1.Malignant neoplasm of upper-outer quadrant of right breast,invasive ductal carcinoma and DCIS,StageIA, pT1cN0M0,ER+/HER2+, PR-,Grade III -She was diagnosed in 06/2019.Given multifocal disease, she underwent right mastectomy and SLNB by Dr Barry Dienes on 09/05/19.Path showedmultifocal disease in right breast, largest 1.4cm, which were found to be high grade invasive ductal carcinoma with components of DCIS. -Given her HER2 positive high grade disease,I started her on adjuvant chemo with weekly paclitaxel andHerceptinfor 12 weeksstarting 10/27/19(taxol started on 11/10/19), followedby maintenance Herceptinq3weeks to complete 1 year treatment. The goal of chemo is curative. -Due to tinnitus, her Kanjinti (Herceptin) was changed toTrazimeraon 11/24/19. -Since being on chemo she has new onset back pain which is increasing and increasing SOB. She is fine to use oxycodone for severe pain only. She did have onset of neuropathy after C8 but has adequate function. I discussed her side effects from chemo will improve after completing.  -  Labs reviewed and adequate to proceed with C11 taxol and Trazimera today at same dose.  -no need  postmastectomy radiation. Will recommend AI after she completes chemo given her post-menopausal status.  -She can proceed with Colonoscopy screening 1-2 months after she completes chemo  -F/u in 2 weeks   2. Smoking Cessation, COPD, Asthma, SOB  -She has been smoking since the 2nd grade. She has been able to cut downto5-15 cigarettes a day.Smoking cessation has been reviewed with her. -Continuealbuterol inhaler and Singulairand f/u with her PCP. -She has had SOB increase since chemo. Her baseline asthma required inhaler use every 2-3 weeks. Now she has SOB episodes every few days. I discussed possible etiology could be from fatigue, her asthma, smoking, Anemia or possibly from chemo. She has decreased breath sounds on exam today (01/19/20), I recommend Chest Xray today to rule out active pulmonary disease especially interstitial disease from Taxol, she is agreeable.  -Continue flonase inhaler and Albuterol as needed.   3. Genetic Testingnegative for pathogenetic mutations -Testing results did showA variant of uncertain significance (VUS) was detected in the ATM gene called c.3834C>A. The report date is 08/10/2019.She can repeat testing in 5-10 years.  4. Comorbidities:DiscoidLupus, Vitiligo, Thyroid disease.  -She was diagnosed with Lupus 10 years ago. She uses steroid cream as needed for her lupus rashes, some skin lesions of which have been removed. -She sees her dermatologist Dr [place] Crane and their [place].   5. Social Support, Anxiety, Insomnia -She lives alone but has good support from her 2 daughters but they live out of town.  -Her daughter notes the patient has anxiety or panic attacks when she gets overwhelmed or disappointed. -Onlow doseXanaxPRN.  -Ipreviouslyoffered her the chance to speak with out SW, she declined.  6. Mild Transminitis  -Improved. Continue monitoring  7. Tinnitus and hearing loss  -Onset after 1-2 cycles of Kanjinti and was  switched to [place]. This persists.  -Her 11/2019 ENT consult with Dr Lucia Gaskins explained she has hearing loss which is probably not related to chemo treatment and her tinnitus is related to her hear loss. He recommended hearing aid.  -Pt feels he did not address her right ear pain and does not return to him. I recommend she f/u with different doctor in the same clinic.  -She will continue humidifier and nasal spray for dried blood in nose.   8. 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  -She has mild decreased sensory on her hands, but mostly feet on exam today (01/19/20). She denies any decrease in function at this time Will monitor.    PLAN: -Labs reviewed and adequate to proceed with week 11 Taxoland Trazimeratodayat same dose  -Lab, flush, and Taxol andTrazimera in 1 week then lab, flush, Trazimera every 3 weeks  -F/u with NP Lacie next week  -CT Chest Xray today for dyspnea  -G1 peripheral neuropathy, she is on the neuropathy study.   No problem-specific Assessment & Plan notes found for this encounter.   Orders Placed This Encounter  Procedures  . DG Chest 2 View    Standing Status:   Future    Number of Occurrences:   1    Standing Expiration Date:   01/18/2021    Order Specific Question:   Reason for Exam (SYMPTOM  OR DIAGNOSIS REQUIRED)    Answer:   dyspnea    Order Specific Question:   Is patient pregnant?    Answer:   No  Order Specific Question:   Preferred imaging location?    Answer:   [facility]   All questions were answered. The patient knows to call the clinic with any problems, questions or concerns. No barriers to learning was detected. The total time spent in the appointment was 30 minutes.     Truitt Merle, MD 01/19/2020   I, Joslyn Devon, am acting as scribe for Truitt Merle, MD.   I have reviewed the above documentation for accuracy and completeness, and I agree with the above.

## 2020-01-16 NOTE — Telephone Encounter (Signed)
Left message to see if patient is interested in counseling.   Gaylyn Rong Counseling Intern

## 2020-01-16 NOTE — Telephone Encounter (Signed)
[id number] Neuropathy  01/16/20    11:12AM  PHONE CALL: Confirmed I was speaking with Stephanie Chase. Informed her I would be seeing her on Monday for questionnaires and neuropathy assessment. I look forward to seeing her on Monday. Thanked her for her time.   Carol Ada, RT(R)(T) Clinical Research Coordinator

## 2020-01-19 ENCOUNTER — Inpatient Hospital Stay: Payer: BC Managed Care – PPO

## 2020-01-19 ENCOUNTER — Ambulatory Visit (HOSPITAL_COMMUNITY)
Admission: RE | Admit: 2020-01-19 | Discharge: 2020-01-19 | Disposition: A | Discharge status: Home / Self Care | Payer: BC Managed Care – PPO | Source: Ambulatory Visit | Attending: Hematology | Admitting: Hematology

## 2020-01-19 ENCOUNTER — Encounter: Payer: Self-pay | Admitting: Hematology

## 2020-01-19 ENCOUNTER — Inpatient Hospital Stay (HOSPITAL_BASED_OUTPATIENT_CLINIC_OR_DEPARTMENT_OTHER): Payer: BC Managed Care – PPO | Admitting: Hematology

## 2020-01-19 ENCOUNTER — Encounter: Payer: Self-pay | Admitting: *Deleted

## 2020-01-19 ENCOUNTER — Encounter: Payer: Self-pay | Admitting: Radiology

## 2020-01-19 ENCOUNTER — Other Ambulatory Visit: Payer: Self-pay

## 2020-01-19 VITALS — BP 133/97 | HR 87 | Temp 97.3°F | Resp 18 | Ht 67.0 in | Wt 168.5 lb

## 2020-01-19 DIAGNOSIS — Z170 Estrogen receptor positive status [ER+]: Secondary | ICD-10-CM

## 2020-01-19 DIAGNOSIS — Z5112 Encounter for antineoplastic immunotherapy: Secondary | ICD-10-CM | POA: Diagnosis not present

## 2020-01-19 DIAGNOSIS — C50411 Malignant neoplasm of upper-outer quadrant of right female breast: Secondary | ICD-10-CM | POA: Insufficient documentation

## 2020-01-19 LAB — CBC WITH DIFFERENTIAL (CANCER CENTER ONLY)
Abs Immature Granulocytes: 0.11 10*3/uL — ABNORMAL HIGH (ref 0.00–0.07)
Basophils Absolute: 0.1 10*3/uL (ref 0.0–0.1)
Basophils Relative: 1 %
Eosinophils Absolute: 0.1 10*3/uL (ref 0.0–0.5)
Eosinophils Relative: 1 %
HCT: 37.1 % (ref 36.0–46.0)
Hemoglobin: 12.5 g/dL (ref 12.0–15.0)
Immature Granulocytes: 1 %
Lymphocytes Relative: 19 %
Lymphs Abs: 2 10*3/uL (ref 0.7–4.0)
MCH: 29.1 pg (ref 26.0–34.0)
MCHC: 33.7 g/dL (ref 30.0–36.0)
MCV: 86.3 fL (ref 80.0–100.0)
Monocytes Absolute: 0.7 10*3/uL (ref 0.1–1.0)
Monocytes Relative: 7 %
Neutro Abs: 7.3 10*3/uL (ref 1.7–7.7)
Neutrophils Relative %: 71 %
Platelet Count: 339 10*3/uL (ref 150–400)
RBC: 4.3 MIL/uL (ref 3.87–5.11)
RDW: 15.9 % — ABNORMAL HIGH (ref 11.5–15.5)
WBC Count: 10.3 10*3/uL (ref 4.0–10.5)
nRBC: 0 % (ref 0.0–0.2)

## 2020-01-19 LAB — CMP (CANCER CENTER ONLY)
ALT: 17 U/L (ref 0–44)
AST: 19 U/L (ref 15–41)
Albumin: 3.8 g/dL (ref 3.5–5.0)
Alkaline Phosphatase: 90 U/L (ref 38–126)
Anion gap: 6 (ref 5–15)
BUN: 12 mg/dL (ref 6–20)
CO2: 25 mmol/L (ref 22–32)
Calcium: 10 mg/dL (ref 8.9–10.3)
Chloride: 105 mmol/L (ref 98–111)
Creatinine: 0.71 mg/dL (ref 0.44–1.00)
GFR, Estimated: >60 mL/min (ref >60)
Glucose, Bld: 89 mg/dL (ref 70–99)
Potassium: 4.1 mmol/L (ref 3.5–5.1)
Sodium: 136 mmol/L (ref 135–145)
Total Bilirubin: 0.3 mg/dL (ref 0.3–1.2)
Total Protein: 6.9 g/dL (ref 6.5–8.1)

## 2020-01-19 IMAGING — CR DG CHEST 2V
2 series · 2 of 2 positions shown · non-contrast
Comparison: Portable exam of [DATE]

CLINICAL DATA: Dyspnea, breast cancer

EXAM:
CHEST - 2 VIEW

[w chest pa]
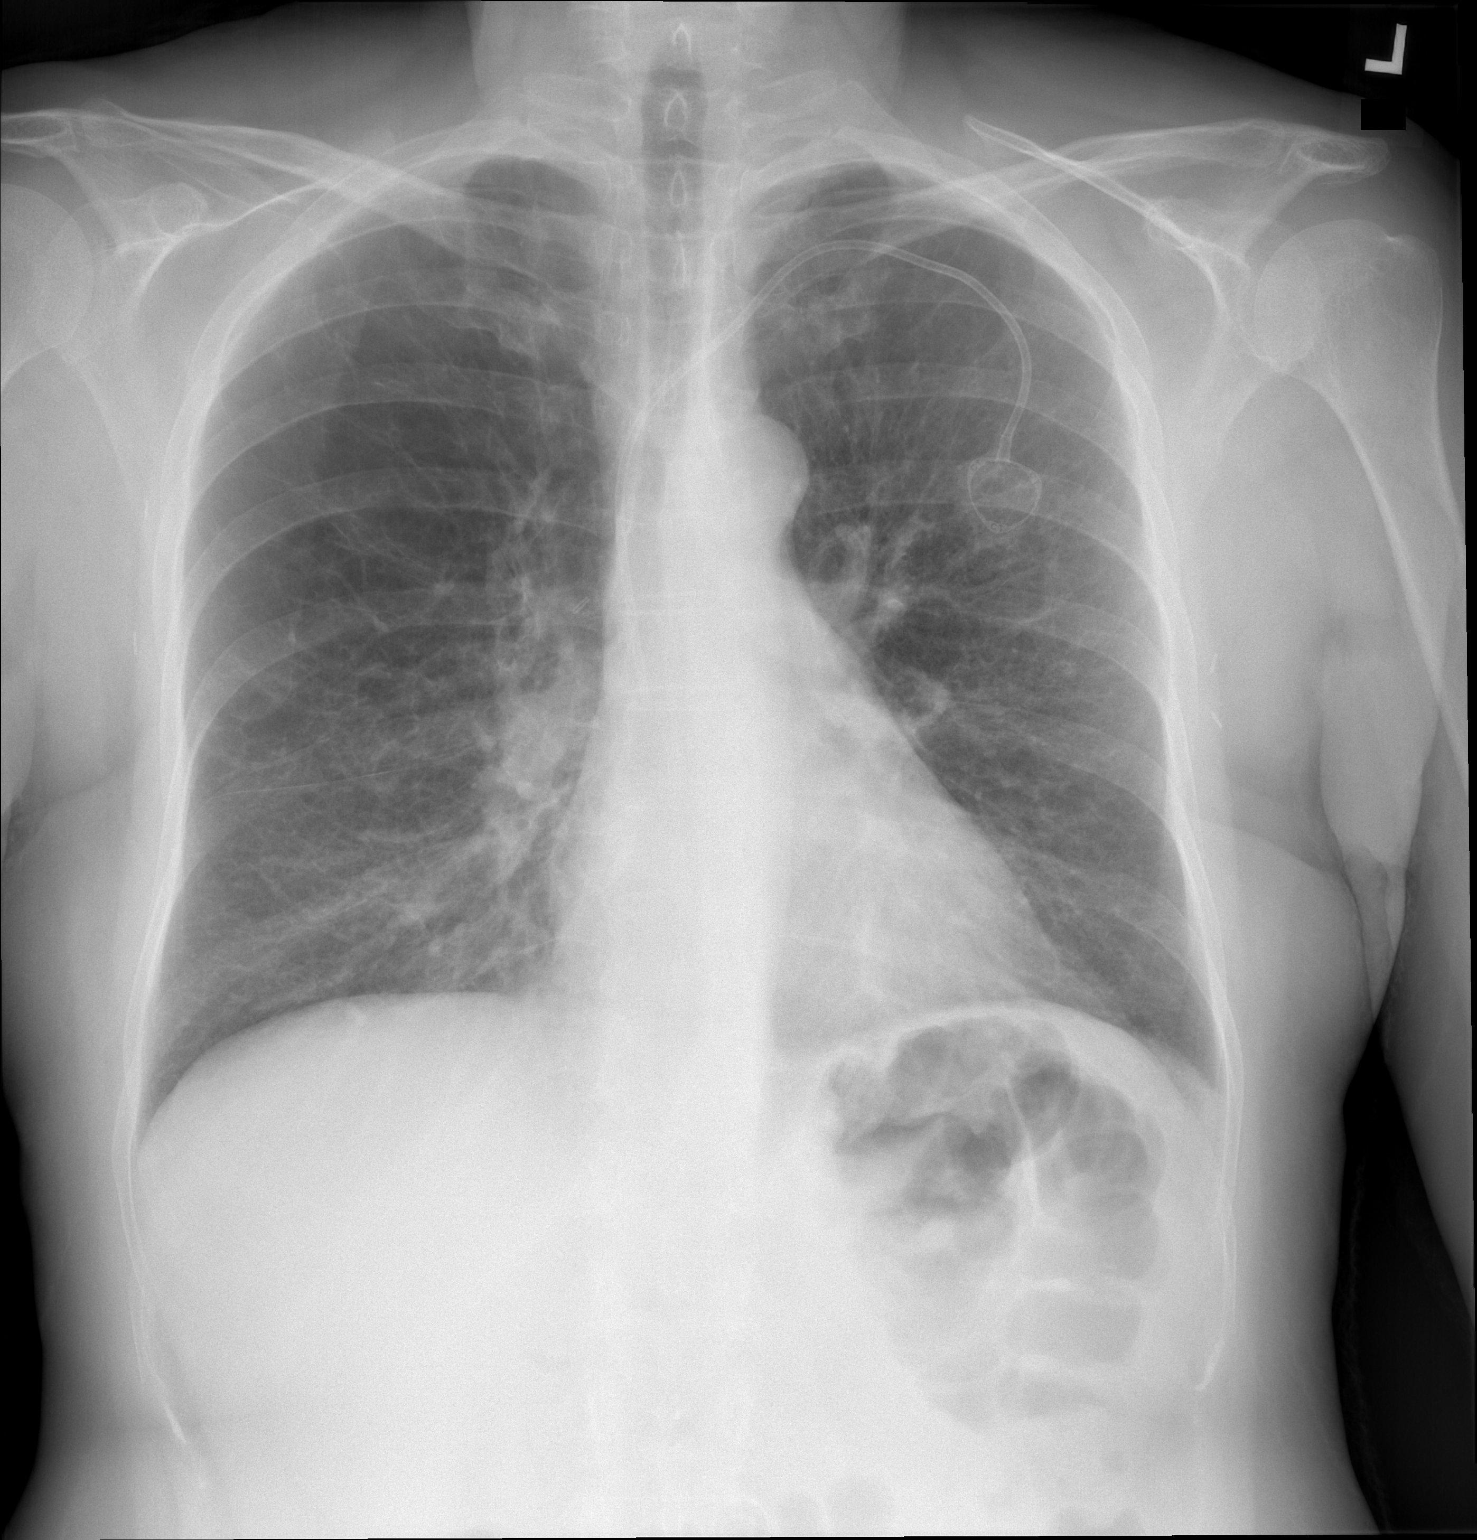

[w chest lat]
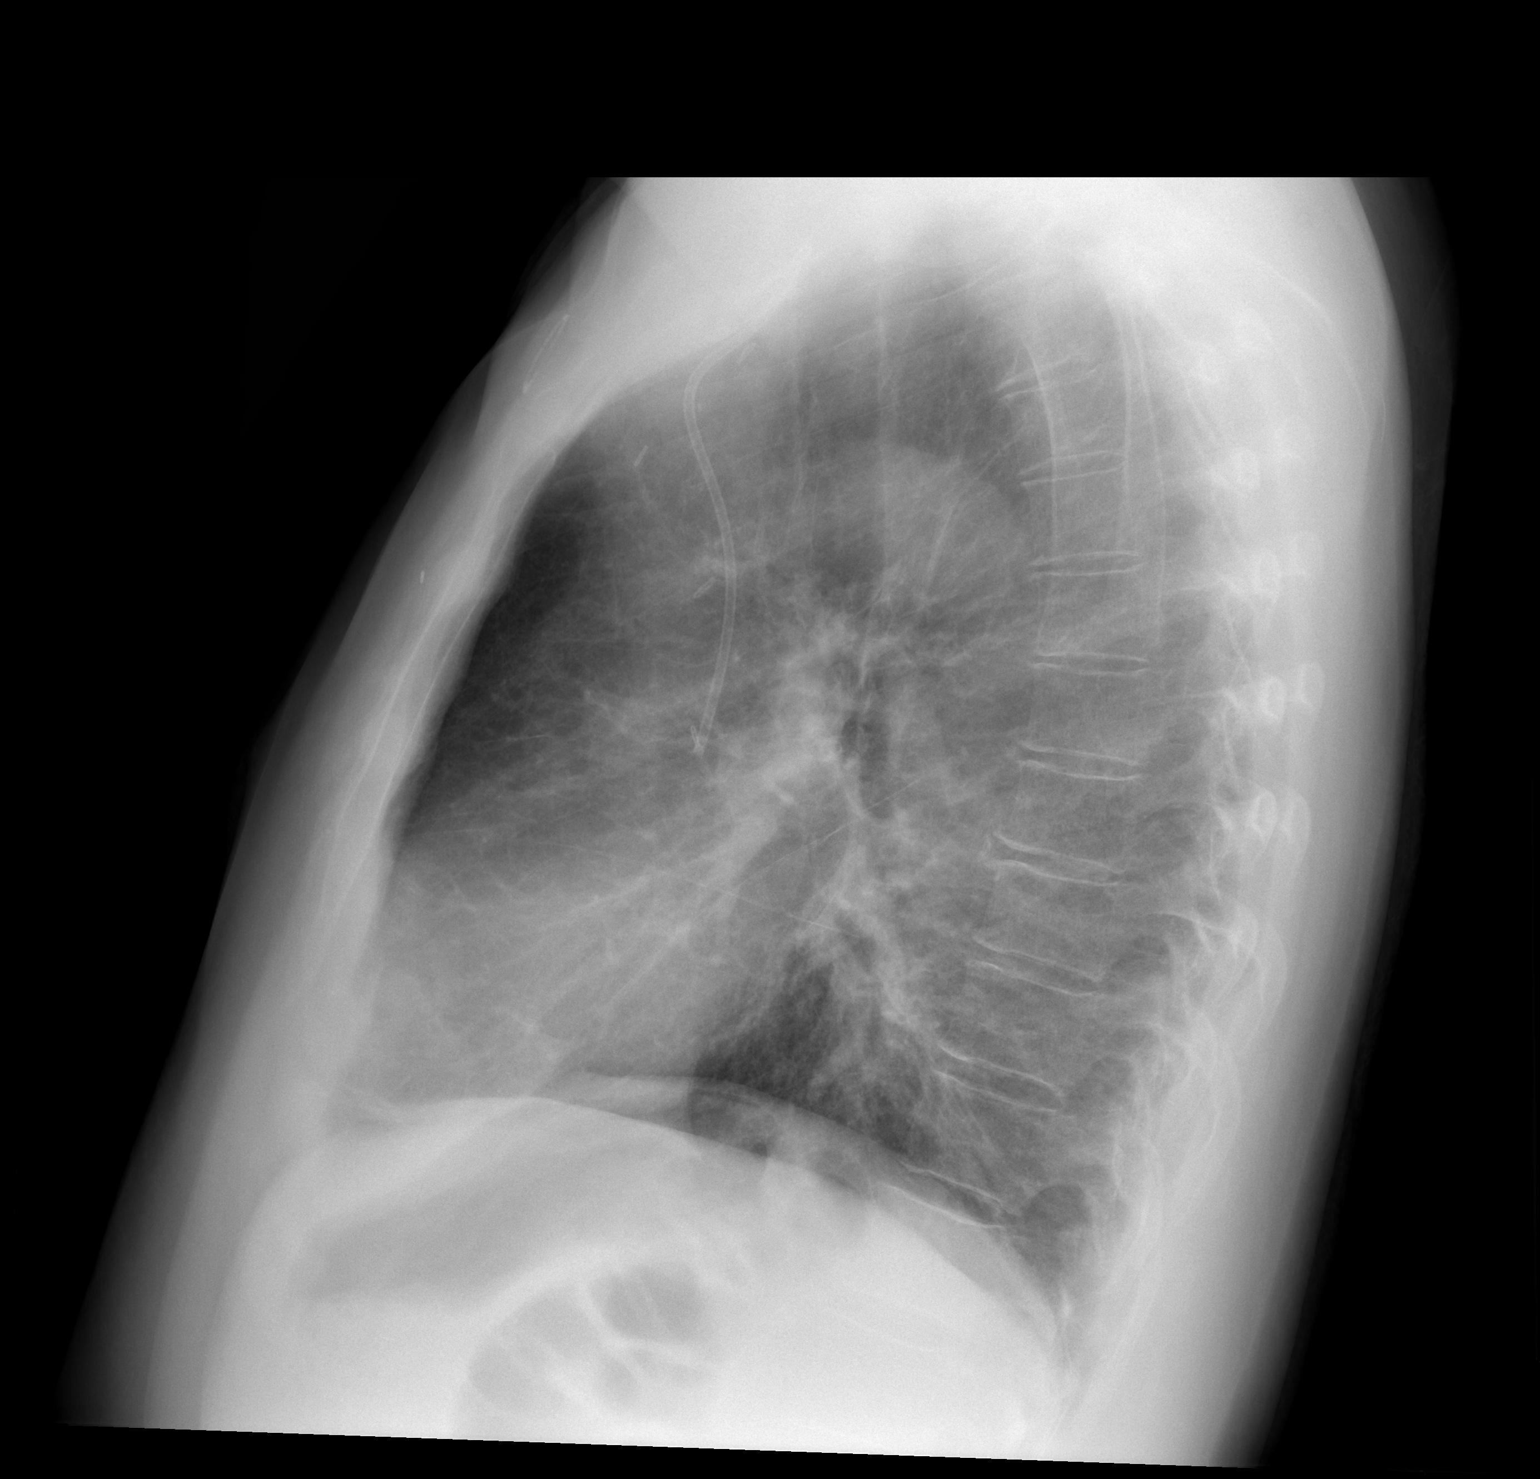

[2 of 2 positions shown; findings below may reference images not displayed]

FINDINGS: LEFT subclavian Port-A-Cath with tip projecting over SVC.

Normal heart size, mediastinal contours, and pulmonary vascularity.

Emphysematous bronchitic changes consistent with COPD.

No pulmonary infiltrate, pleural effusion, or pneumothorax.

Post BILATERAL mastectomy.

Osseous structures unremarkable.
IMPRESSION: COPD changes without acute abnormalities.

## 2020-01-19 MED ORDER — SODIUM CHLORIDE 0.9 % IV SOLN: 40.0000 mg | Freq: Once | INTRAVENOUS | Status: DC

## 2020-01-19 MED ORDER — SODIUM CHLORIDE 0.9 % IV SOLN
20.0000 mg | Freq: Once | INTRAVENOUS | Status: AC
  Administered 2020-01-19: 20 mg via INTRAVENOUS
  Filled 2020-01-19: qty 20

## 2020-01-19 MED ORDER — SODIUM CHLORIDE 0.9 % IV SOLN
Freq: Once | INTRAVENOUS | Status: AC
  Filled 2020-01-19: qty 250

## 2020-01-19 MED ORDER — DIPHENHYDRAMINE HCL 50 MG/ML IJ SOLN
INTRAMUSCULAR | Status: AC
  Filled 2020-01-19: qty 1

## 2020-01-19 MED ORDER — SODIUM CHLORIDE 0.9% FLUSH
10.0000 mL | INTRAVENOUS | Status: DC | PRN
  Administered 2020-01-19: 10 mL
  Filled 2020-01-19: qty 10

## 2020-01-19 MED ORDER — SODIUM CHLORIDE 0.9 % IV SOLN
40.0000 mg | Freq: Once | INTRAVENOUS | Status: AC
  Administered 2020-01-19: 40 mg via INTRAVENOUS
  Filled 2020-01-19: qty 4

## 2020-01-19 MED ORDER — ACETAMINOPHEN 325 MG PO TABS
ORAL_TABLET | ORAL | Status: AC
  Filled 2020-01-19: qty 2

## 2020-01-19 MED ORDER — TRASTUZUMAB-QYYP CHEMO 420 MG IV SOLR
150.0000 mg | Freq: Once | INTRAVENOUS | Status: AC
  Administered 2020-01-19: 150 mg via INTRAVENOUS
  Filled 2020-01-19: qty 7.14

## 2020-01-19 MED ORDER — HEPARIN SOD (PORK) LOCK FLUSH 100 UNIT/ML IV SOLN
500.0000 [IU] | Freq: Once | INTRAVENOUS | Status: AC | PRN
  Administered 2020-01-19: 500 [IU]
  Filled 2020-01-19: qty 5

## 2020-01-19 MED ORDER — SODIUM CHLORIDE 0.9 % IV SOLN
60.0000 mg/m2 | Freq: Once | INTRAVENOUS | Status: AC
  Administered 2020-01-19: 108 mg via INTRAVENOUS
  Filled 2020-01-19: qty 18

## 2020-01-19 MED ORDER — ACETAMINOPHEN 325 MG PO TABS
650.0000 mg | ORAL_TABLET | Freq: Once | ORAL | Status: AC
  Administered 2020-01-19: 650 mg via ORAL

## 2020-01-19 MED ORDER — DIPHENHYDRAMINE HCL 50 MG/ML IJ SOLN
25.0000 mg | Freq: Once | INTRAMUSCULAR | Status: AC
  Administered 2020-01-19: 25 mg via INTRAVENOUS

## 2020-01-19 NOTE — Research (Signed)
[id number], A PROSPECTIVE OBSERVATIONAL COHORT STUDY TO DEVELOP A PREDICTIVE MODEL OFTAXANE-INDUCED PERIPHERAL NEUROPATHY IN CANCER PATIENTS.  01/19/20   8:45AM  8 WEEK ASSESSMENT:  Supplements, Topical Agents and Other Treatments:Reviewed with patient and CRFs completed.Patient is takingacetaminophen and Ibuprofen but not for neuropathy symptoms.She recently just began a Vitamin B complex per recommendation from MD.She is not using menthol or any other topical creams, but is using cooling gloves and ice on her feet during infusion.   Treatment Regimen: Taxol on 01/12/20 was reduced to 60 mg/m^2 due to neuropathy symptoms. Still going at the same frequency.   Neuropen Assessment:Completed per protocol byMira Leonetti,certified research RNwith assistance recording bymyself.   [place] Assessment:Completed per protocol byMira Leonetti,certified research RNwith assistance timing and recording bymyself.  PROs: All PROs were completed by patient and responses were entered into RAVE.  Physician Assessments:CTCAE and Treatment Burden forms completed and signed byDr. Burr Medico.Patient stated she has begun having neuropathy primarily in her left foot. She described this neuropathy as burning at time and numbness at times. She also has experienced some in her right foot as well and a very small amount in her hands.  Plan:Informed patient of next study assessmentsduein 4 weeks(+/- 2 weeks)and will be done on same day as treatment.Thanked patient for participating in this study today. Encouraged her to call clinic if any questions or concerns prior to next appointment. She verbalized understanding.  Carol Ada, RT(R)(T) Clinical Research Coordinator

## 2020-01-19 NOTE — Patient Instructions (Signed)
[facility] Discharge Instructions for Patients Receiving Chemotherapy  Today you received the following chemotherapy agents: trastuzumab/paclitaxel.  To help prevent nausea and vomiting after your treatment, we encourage you to take your nausea medication as directed.   If you develop nausea and vomiting that is not controlled by your nausea medication, call the clinic.   BELOW ARE SYMPTOMS THAT SHOULD BE REPORTED IMMEDIATELY:  *FEVER GREATER THAN 100.5 F  *CHILLS WITH OR WITHOUT FEVER  NAUSEA AND VOMITING THAT IS NOT CONTROLLED WITH YOUR NAUSEA MEDICATION  *UNUSUAL SHORTNESS OF BREATH  *UNUSUAL BRUISING OR BLEEDING  TENDERNESS IN MOUTH AND THROAT WITH OR WITHOUT PRESENCE OF ULCERS  *URINARY PROBLEMS  *BOWEL PROBLEMS  UNUSUAL RASH Items with * indicate a potential emergency and should be followed up as soon as possible.  Feel free to call the clinic should you have any questions or concerns. The clinic phone number is [phone].  Please show the CHEMO ALERT CARD at check-in to the Emergency Department and triage nurse.   

## 2020-01-19 NOTE — Progress Notes (Signed)
Patient will receive trastuzumab 2 mg/kg today and next week, then will begin 6 mg/kg maintenance dose on 11/8 per Dr. Burr Medico. Orders changed per her instructions.

## 2020-01-20 ENCOUNTER — Ambulatory Visit: Payer: BC Managed Care – PPO

## 2020-01-20 ENCOUNTER — Telehealth: Payer: Self-pay | Admitting: *Deleted

## 2020-01-20 DIAGNOSIS — C50411 Malignant neoplasm of upper-outer quadrant of right female breast: Secondary | ICD-10-CM | POA: Diagnosis not present

## 2020-01-20 DIAGNOSIS — M25612 Stiffness of left shoulder, not elsewhere classified: Secondary | ICD-10-CM

## 2020-01-20 DIAGNOSIS — M25611 Stiffness of right shoulder, not elsewhere classified: Secondary | ICD-10-CM

## 2020-01-20 DIAGNOSIS — M25512 Pain in left shoulder: Secondary | ICD-10-CM

## 2020-01-20 DIAGNOSIS — M25511 Pain in right shoulder: Secondary | ICD-10-CM

## 2020-01-20 DIAGNOSIS — R293 Abnormal posture: Secondary | ICD-10-CM

## 2020-01-20 DIAGNOSIS — Z170 Estrogen receptor positive status [ER+]: Secondary | ICD-10-CM

## 2020-01-20 NOTE — Therapy (Signed)
[place], [place], [address] Phone: [phone]   Fax:  [phone]  Physical Therapy Treatment  Patient Details  Name: Stephanie Chase MRN: [id number] Date of Birth: 1961-02-23 Referring Provider (PT): Dr. Stark Klein   Encounter Date: 01/20/2020   PT End of Session - 01/20/20 0901    Visit Number 16    Number of Visits 26    Date for PT Re-Evaluation 02/17/20    PT Start Time 0808    PT Stop Time 0848   pt reports needed to leave early to get to work   PT Time Calculation (min) 40 min    Activity Tolerance Patient tolerated treatment well    Behavior During Therapy [facility] for tasks assessed/performed           Past Medical History:  Diagnosis Date  . Anemia   . Anxiety   . Asthma   . Breast cancer ([place])   . COPD (chronic obstructive pulmonary disease) ([place])   . Discoid lupus   . Family history of bladder cancer   . Family history of BRCA gene mutation   . Family history of breast cancer   . Family history of prostate cancer   . GERD (gastroesophageal reflux disease)   . Hypothyroidism   . Pneumonia   . Thyroid disease     Past Surgical History:  Procedure Laterality Date  . ABLATION ON ENDOMETRIOSIS    . APPLICATION OF A-CELL OF EXTREMITY Right 10/22/2019   Procedure: EXCISION OF RIGHT BREAST WOUND WITH PRIMARY CLOSURE;  Surgeon: [name] Going, DO;  Location: [place];  Service: Plastics;  Laterality: Right;  . BREAST LUMPECTOMY    . DEBRIDEMENT AND CLOSURE WOUND Right 10/22/2019   Procedure: Excision of right breast wound;  Surgeon: [name] Going, DO;  Location: [place];  Service: Plastics;  Laterality: Right;  . MASTECTOMY W/ SENTINEL NODE BIOPSY Bilateral 09/05/2019   Procedure: BILATERAL MASTECTOMY WITH RIGHT SENTINEL LYMPH NODE BIOPSY;  Surgeon: Stark Klein, MD;  Location: [place];  Service: General;  Laterality: Bilateral;  COMBINED WITH REGIONAL FOR POST OP PAIN  .  PORTACATH PLACEMENT Left 09/05/2019   Procedure: INSERTION PORT-A-CATH WITH ULTRASOUND GUIDANCE;  Surgeon: Stark Klein, MD;  Location: [place];  Service: General;  Laterality: Left;  . TONSILLECTOMY    . TUBAL LIGATION    . WISDOM TOOTH EXTRACTION      There were no vitals filed for this visit.       [facility] PT Assessment - 01/20/20 0001      AROM   Right Shoulder Flexion 160 Degrees    Left Shoulder Flexion 160 Degrees                         [facility] Adult PT Treatment/Exercise - 01/20/20 0001      Exercises   Other Exercises  Completed instruction of Strength ABC with pt returning therapist demo of each exercise                    PT Short Term Goals - 12/16/19 1005      PT SHORT TERM GOAL #1   Title Pt will be independent with HEP and Modified MLD for the anterior trunk within 2 weeks in order to demonstrate autonomy of care.    Baseline pt provided with HEP today; pt independent with this now-12/16/19    Status Achieved  PT Long Term Goals - 01/20/20 1016      PT LONG TERM GOAL #1   Title Pt will demonstrate 150 degrees bil shoulder flexion and abduction within 4 weeks to demonstrate return to pre-operative AROM.    Baseline R shoulder flexion: 110, abduction: 69 L shoulder flexion: 118, abduction:  77; approximate AA/ROM with pulleys Rt 120 and abduction 140 degrees, Lt 150 degrees flexion and abduction (forgot to measure but this is visibly much improved since last measured)-11/13/19; Rt flex149, abd 152 with discomfort and pulling reported at top of shoulder and Lt flex 154, abd 170 - 12/16/19; Rt flex 160 and abd 170, Lt flex 160 and abd 173 - 01/20/20    Status Achieved      PT LONG TERM GOAL #2   Title Pt will report 75% improvement in functional mobility and pain in order to demonstrate an improvement in subjective quality of life.    Baseline 2/10 pain, decresaed ROM below shoulder height. MOdifications with bathing/dressing; pt  reports 50% improvement at this time as she is still limited with vaccuuming, lifting and reaching above head-11/13/19; pt reports 75-80% improvement at this time-12/16/19; some increase joint soreness with Taxol but overall not limitng to ADLs - 01/20/20    Status Achieved      PT LONG TERM GOAL #3   Title Pt to be independent with HEP for improved A/ROM for bil shoulders and bil UE and postural strength to improve ease with ADLs.    Baseline No current HEP though began this today as she now has clearance for ROM from surgeon-11/13/19; pt is independenet with HEP thus far and progressed this today-12/16/19; Instructed in Strength ABC program today, pt may return for final assess of this in a few weeks-01/20/20    Status Partially Met                 Plan - 01/20/20 1610    Clinical Impression Statement Comoleted instruction of Strength ABC program which pt olerated very well returning good demo of each exercise. She reports would like to be placed on hold for a few weeks as she transitions from completing Taxol, and going from weekly chemo to every 3 weeks. May want to return 1x in a few weeks for final reassess and check on technique of Strength ABC. Renewal done this session for this.    Personal Factors and Comorbidities Comorbidity 1    Comorbidities bil masectomy with 3 lymph node removal on the R    Stability/Clinical Decision Making Stable/Uncomplicated    Rehab Potential Good    PT Frequency 1x / week    PT Duration 4 weeks    PT Treatment/Interventions Therapeutic activities;Therapeutic exercise;Neuromuscular re-education;Manual techniques;Cryotherapy;Moist Heat    PT Next Visit Plan Pt on hold and may return for 1-2 more visits in a few weeks for final reassess and after transitioning from weekly to every 3 weeks chemo. Renewal done today for 1x/wk for 4 more weeks but probably won't use all visits.    PT Home Exercise Plan Standing dowel exercises, supine scapular series; bil  shoulder isometrics; Strength ABC    Consulted and Agree with Plan of Care Patient           Patient will benefit from skilled therapeutic intervention in order to improve the following deficits and impairments:  Decreased knowledge of precautions, Pain, Decreased range of motion, Increased edema  Visit Diagnosis: Malignant neoplasm of upper-outer quadrant of right breast in female, estrogen receptor positive (  [place])  Stiffness of left shoulder, not elsewhere classified  Stiffness of right shoulder, not elsewhere classified  Acute pain of right shoulder  Acute pain of left shoulder  Abnormal posture     Problem List Patient Active Problem List   Diagnosis Date Noted  . Acquired absence of breast and absent nipple, bilateral 10/07/2019  . Breast wound, right, initial encounter 10/07/2019  . Breast cancer of upper-outer quadrant of right female breast ([place]) 09/05/2019  . Genetic testing 08/12/2019  . Family history of BRCA gene mutation 07/31/2019  . Family history of breast cancer   . Family history of prostate cancer   . Family history of bladder cancer   . Malignant neoplasm of upper-outer quadrant of right breast in female, estrogen receptor positive ([place]) 07/24/2019  . Annual physical exam 03/31/2016  . Occupational bronchitis ([place]) 03/03/2015  . Hyperlipidemia 03/30/2014  . 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 Generalized osteoarthritis 02/23/2012  . Plantar fasciitis, bilateral 02/23/2012  . Vitiligo 02/23/2012  . Difficulty hearing 02/13/2012  . History of lupus 12/21/2011  . Vitamin D deficiency 10/11/2010  . Hypothyroidism 06/21/2010  . Systemic lupus erythematosus ([place]) 06/14/2010    Otelia Limes, PTA 01/20/2020, 10:20 AM  [place], [place], [address] Phone: [phone]   Fax:  [phone]  Name: Stephanie Chase MRN: [id number] Date of Birth: Jan 13, 1961 Cheral Almas,  PT 01/20/20 10:57 AM

## 2020-01-20 NOTE — Telephone Encounter (Signed)
Second Dexilant denial for appeal received.   Reason "STEP Therapy" for "Dexilant. Submitted 01/13/2020 after denial of 01/09/2020 order.  "RXBenefit Plan:    PROTON PUMP INHIBITORS esomeprazole lansoprazole omeprazole pantoprazole DEXILANT

## 2020-01-21 ENCOUNTER — Telehealth: Payer: Self-pay

## 2020-01-21 NOTE — Telephone Encounter (Signed)
-----   Message from Truitt Merle, MD sent at 01/20/2020  9:21 AM EDT ----- Please let pt know her CXR results, no concern for any acute changes, especially Taxol related pneumonitis. She does have COPD, her dyspnea is probably related to her asthma and fatigue from chemo. Thanks   Truitt Merle  01/20/2020

## 2020-01-21 NOTE — Telephone Encounter (Signed)
I spoke with Stephanie Chase and relayed Dr Ernestina Penna comments and recommendations.  She verbalized understanding.

## 2020-01-22 ENCOUNTER — Telehealth: Payer: Self-pay | Admitting: *Deleted

## 2020-01-22 ENCOUNTER — Inpatient Hospital Stay: Payer: BC Managed Care – PPO | Admitting: General Practice

## 2020-01-22 ENCOUNTER — Telehealth: Payer: Self-pay | Admitting: Hematology

## 2020-01-22 DIAGNOSIS — C50411 Malignant neoplasm of upper-outer quadrant of right female breast: Secondary | ICD-10-CM

## 2020-01-22 NOTE — Telephone Encounter (Signed)
No 10/25 los

## 2020-01-22 NOTE — Telephone Encounter (Signed)
Pt called with concerns regarding going back to work vs disability. Informed pt that once completes chemo and on maintenance herceptin. May go back work as herceptin doesn't effect WBC. Discussed proper techniques of handwashing and sanitizing as mask wearing to help prevent illness. Received verbal understanding.

## 2020-01-22 NOTE — Progress Notes (Signed)
[facility] CSW Progress Notes  Met w patient by phone to review progress towards goals.  She has reviewed her symptom concerns w oncologist, is partially reassured but still experiencing worry about how side effects will impact her life.  She has been told she needs to return to the office two days/week - this is a concern due to potential dizziness which impacts her confidence in driving to/from work.  She has used up her FMLA days, so has been told she needs to go on long term disability if she decides not to return to the office.  This will impact her financial situation.  She is also worried about her elderly mother who lives out of state and she has been diagnosed w cancer also.  Discussed role of mindfulness techniques in managing normal anxiety during cancer treatment.  Have made referral to [facility] Counseling intern - will ask intern to reach out again as patient is interested in ongoing work on anxiety management.  CSW willr each out to patient in two weeks.   Edwyna Shell, LCSW Clinical Social Worker Phone:  [phone]

## 2020-01-23 ENCOUNTER — Telehealth: Payer: Self-pay

## 2020-01-23 NOTE — Telephone Encounter (Signed)
Called again to reach out after Gillie Manners said patient accidentally deleted my message.  Gaylyn Rong Counseling Intern

## 2020-01-23 NOTE — Telephone Encounter (Signed)
Patient called back and is unable to schedule counseling right now; will call back in about a week to see if she's ready.  Gaylyn Rong Counseling Intern

## 2020-01-26 ENCOUNTER — Inpatient Hospital Stay: Payer: BC Managed Care – PPO

## 2020-01-26 ENCOUNTER — Other Ambulatory Visit: Payer: Self-pay

## 2020-01-26 ENCOUNTER — Encounter: Payer: Self-pay | Admitting: *Deleted

## 2020-01-26 ENCOUNTER — Other Ambulatory Visit: Payer: Self-pay | Admitting: *Deleted

## 2020-01-26 ENCOUNTER — Inpatient Hospital Stay: Payer: BC Managed Care – PPO | Attending: Hematology

## 2020-01-26 VITALS — BP 131/83 | HR 80 | Temp 98.5°F | Resp 18

## 2020-01-26 DIAGNOSIS — C50411 Malignant neoplasm of upper-outer quadrant of right female breast: Secondary | ICD-10-CM | POA: Insufficient documentation

## 2020-01-26 DIAGNOSIS — Z23 Encounter for immunization: Secondary | ICD-10-CM | POA: Insufficient documentation

## 2020-01-26 DIAGNOSIS — Z5112 Encounter for antineoplastic immunotherapy: Secondary | ICD-10-CM | POA: Diagnosis not present

## 2020-01-26 DIAGNOSIS — L80 Vitiligo: Secondary | ICD-10-CM | POA: Diagnosis not present

## 2020-01-26 DIAGNOSIS — Z5111 Encounter for antineoplastic chemotherapy: Secondary | ICD-10-CM | POA: Diagnosis present

## 2020-01-26 DIAGNOSIS — L659 Nonscarring hair loss, unspecified: Secondary | ICD-10-CM | POA: Diagnosis not present

## 2020-01-26 DIAGNOSIS — M549 Dorsalgia, unspecified: Secondary | ICD-10-CM | POA: Diagnosis not present

## 2020-01-26 DIAGNOSIS — Z170 Estrogen receptor positive status [ER+]: Secondary | ICD-10-CM | POA: Diagnosis not present

## 2020-01-26 LAB — CMP (CANCER CENTER ONLY)
ALT: 22 U/L (ref 0–44)
AST: 25 U/L (ref 15–41)
Albumin: 3.8 g/dL (ref 3.5–5.0)
Alkaline Phosphatase: 91 U/L (ref 38–126)
Anion gap: 7 (ref 5–15)
BUN: 12 mg/dL (ref 6–20)
CO2: 27 mmol/L (ref 22–32)
Calcium: 10.2 mg/dL (ref 8.9–10.3)
Chloride: 105 mmol/L (ref 98–111)
Creatinine: 0.78 mg/dL (ref 0.44–1.00)
GFR, Estimated: >60 mL/min (ref >60)
Glucose, Bld: 83 mg/dL (ref 70–99)
Potassium: 4 mmol/L (ref 3.5–5.1)
Sodium: 139 mmol/L (ref 135–145)
Total Bilirubin: 0.4 mg/dL (ref 0.3–1.2)
Total Protein: 6.8 g/dL (ref 6.5–8.1)

## 2020-01-26 LAB — CBC WITH DIFFERENTIAL (CANCER CENTER ONLY)
Abs Immature Granulocytes: 0.08 10*3/uL — ABNORMAL HIGH (ref 0.00–0.07)
Basophils Absolute: 0.1 10*3/uL (ref 0.0–0.1)
Basophils Relative: 1 %
Eosinophils Absolute: 0.1 10*3/uL (ref 0.0–0.5)
Eosinophils Relative: 1 %
HCT: 37.1 % (ref 36.0–46.0)
Hemoglobin: 12.6 g/dL (ref 12.0–15.0)
Immature Granulocytes: 1 %
Lymphocytes Relative: 17 %
Lymphs Abs: 1.8 10*3/uL (ref 0.7–4.0)
MCH: 29.9 pg (ref 26.0–34.0)
MCHC: 34 g/dL (ref 30.0–36.0)
MCV: 87.9 fL (ref 80.0–100.0)
Monocytes Absolute: 0.6 10*3/uL (ref 0.1–1.0)
Monocytes Relative: 6 %
Neutro Abs: 8.1 10*3/uL — ABNORMAL HIGH (ref 1.7–7.7)
Neutrophils Relative %: 74 %
Platelet Count: 312 10*3/uL (ref 150–400)
RBC: 4.22 MIL/uL (ref 3.87–5.11)
RDW: 16 % — ABNORMAL HIGH (ref 11.5–15.5)
WBC Count: 10.8 10*3/uL — ABNORMAL HIGH (ref 4.0–10.5)
nRBC: 0 % (ref 0.0–0.2)

## 2020-01-26 MED ORDER — FAMOTIDINE IN NACL 20-0.9 MG/50ML-% IV SOLN
INTRAVENOUS | Status: AC
  Filled 2020-01-26: qty 50

## 2020-01-26 MED ORDER — SODIUM CHLORIDE 0.9% FLUSH
10.0000 mL | INTRAVENOUS | Status: DC | PRN
  Administered 2020-01-26: 10 mL
  Filled 2020-01-26: qty 10

## 2020-01-26 MED ORDER — SODIUM CHLORIDE 0.9% FLUSH
10.0000 mL | INTRAVENOUS | Status: DC | PRN
  Filled 2020-01-26: qty 10

## 2020-01-26 MED ORDER — DIPHENHYDRAMINE HCL 50 MG/ML IJ SOLN
INTRAMUSCULAR | Status: AC
  Filled 2020-01-26: qty 1

## 2020-01-26 MED ORDER — HEPARIN SOD (PORK) LOCK FLUSH 100 UNIT/ML IV SOLN
500.0000 [IU] | Freq: Once | INTRAVENOUS | Status: AC | PRN
  Administered 2020-01-26: 500 [IU]
  Filled 2020-01-26: qty 5

## 2020-01-26 MED ORDER — SODIUM CHLORIDE 0.9 % IV SOLN
20.0000 mg | Freq: Once | INTRAVENOUS | Status: AC
  Administered 2020-01-26: 20 mg via INTRAVENOUS
  Filled 2020-01-26: qty 20

## 2020-01-26 MED ORDER — SODIUM CHLORIDE 0.9 % IV SOLN
60.0000 mg/m2 | Freq: Once | INTRAVENOUS | Status: AC
  Administered 2020-01-26: 114 mg via INTRAVENOUS
  Filled 2020-01-26: qty 19

## 2020-01-26 MED ORDER — TRASTUZUMAB-QYYP CHEMO 420 MG IV SOLR
150.0000 mg | Freq: Once | INTRAVENOUS | Status: AC
  Administered 2020-01-26: 150 mg via INTRAVENOUS
  Filled 2020-01-26: qty 7.14

## 2020-01-26 MED ORDER — INFLUENZA VAC SPLIT QUAD 0.5 ML IM SUSY
PREFILLED_SYRINGE | INTRAMUSCULAR | Status: AC
  Filled 2020-01-26: qty 0.5

## 2020-01-26 MED ORDER — DIPHENHYDRAMINE HCL 50 MG/ML IJ SOLN
25.0000 mg | Freq: Once | INTRAMUSCULAR | Status: AC
  Administered 2020-01-26: 25 mg via INTRAVENOUS

## 2020-01-26 MED ORDER — INFLUENZA VAC SPLIT QUAD 0.5 ML IM SUSY
0.5000 mL | PREFILLED_SYRINGE | Freq: Once | INTRAMUSCULAR | Status: AC
  Administered 2020-01-26: 0.5 mL via INTRAMUSCULAR

## 2020-01-26 MED ORDER — SODIUM CHLORIDE 0.9 % IV SOLN
Freq: Once | INTRAVENOUS | Status: AC
  Filled 2020-01-26: qty 250

## 2020-01-26 MED ORDER — ACETAMINOPHEN 325 MG PO TABS
650.0000 mg | ORAL_TABLET | Freq: Once | ORAL | Status: AC
  Administered 2020-01-26: 650 mg via ORAL

## 2020-01-26 MED ORDER — SODIUM CHLORIDE 0.9 % IV SOLN
40.0000 mg | Freq: Once | INTRAVENOUS | Status: AC
  Administered 2020-01-26: 40 mg via INTRAVENOUS
  Filled 2020-01-26: qty 4

## 2020-01-26 MED ORDER — ACETAMINOPHEN 325 MG PO TABS
ORAL_TABLET | ORAL | Status: AC
  Filled 2020-01-26: qty 2

## 2020-01-26 NOTE — Patient Instructions (Signed)
[facility] Discharge Instructions for Patients Receiving Chemotherapy  Today you received the following chemotherapy agents Trastuzumab and Taxol  To help prevent nausea and vomiting after your treatment, we encourage you to take your nausea medication as directed.    If you develop nausea and vomiting that is not controlled by your nausea medication, call the clinic.   BELOW ARE SYMPTOMS THAT SHOULD BE REPORTED IMMEDIATELY:  *FEVER GREATER THAN 100.5 F  *CHILLS WITH OR WITHOUT FEVER  NAUSEA AND VOMITING THAT IS NOT CONTROLLED WITH YOUR NAUSEA MEDICATION  *UNUSUAL SHORTNESS OF BREATH  *UNUSUAL BRUISING OR BLEEDING  TENDERNESS IN MOUTH AND THROAT WITH OR WITHOUT PRESENCE OF ULCERS  *URINARY PROBLEMS  *BOWEL PROBLEMS  UNUSUAL RASH Items with * indicate a potential emergency and should be followed up as soon as possible.  Feel free to call the clinic should you have any questions or concerns. The clinic phone number is [phone].  Please show the CHEMO ALERT CARD at check-in to the Emergency Department and triage nurse.   

## 2020-01-26 NOTE — Patient Instructions (Signed)

## 2020-01-30 ENCOUNTER — Other Ambulatory Visit: Payer: Self-pay | Admitting: *Deleted

## 2020-01-30 DIAGNOSIS — Z006 Encounter for examination for normal comparison and control in clinical research program: Secondary | ICD-10-CM

## 2020-02-02 ENCOUNTER — Inpatient Hospital Stay: Payer: BC Managed Care – PPO | Admitting: Nurse Practitioner

## 2020-02-02 ENCOUNTER — Telehealth: Payer: Self-pay

## 2020-02-02 ENCOUNTER — Inpatient Hospital Stay: Payer: BC Managed Care – PPO

## 2020-02-02 NOTE — Telephone Encounter (Signed)
Scheduled first counseling session for tomorrow at 1 pm.   Gaylyn Rong Counseling Intern

## 2020-02-03 ENCOUNTER — Encounter: Payer: Self-pay | Admitting: Hematology

## 2020-02-03 ENCOUNTER — Other Ambulatory Visit: Payer: Self-pay | Admitting: Hematology

## 2020-02-03 NOTE — Progress Notes (Signed)
[facility] Patient and [facility] Counseling Note   This was an intake session, so it largely focused on discussing the PDS and intake forms. The patient had no questions on the PDS and provided informed consent. Counselor reviewed intake form with patient and asked for clarifying information. Patient described her experience as having to "be my own advocate," as she feels dismissed and not treated like an individual by her doctors. Patient described her familial support and disclosed her trauma history. Patient is considering quitting cancer treatment and is unsure about where to go from here. Patient identified wanting to learn to cope with her stressors.Patient was oriented times three and showed no SI/HI/NSSI. Her mood was neutral and her affect was somewhat flat, although she laughed sometimes at hard questions. This patient is extremely overwhelmed with her treatment and is frustrated that she cannot get the answers and information she wants from doctors. A fear of the unknown seems to be a thread connecting her concerns, so future assessment will examine that. Next session will focus on continuing to untangle the web and introduce coping strategies to the patient.    Gaylyn Rong Counseling Intern

## 2020-02-04 ENCOUNTER — Encounter: Payer: Self-pay | Admitting: *Deleted

## 2020-02-04 NOTE — Progress Notes (Signed)
Received a referral for second opinion. She currently is seen at the main [facility] campus.   Reached out to Bobbye Morton to introduce myself as the office RN Navigator and explain our new patient process. Reviewed their new patient appointment date. Provided address and directions to the office including call back phone number. Reviewed with patient any concerns they may have or any possible barriers to attending their appointment.   Notified breast navigators of patient's request for second opinion.

## 2020-02-05 ENCOUNTER — Telehealth: Payer: Self-pay | Admitting: Radiology

## 2020-02-06 ENCOUNTER — Telehealth: Payer: Self-pay | Admitting: Radiology

## 2020-02-06 NOTE — Telephone Encounter (Signed)
[id number]: Neuropathy  02/06/20     11:15 AM  PHONE: Called and left message for Charlsie Merles, RN, Nurse Navigator to ask if she had any further details about patient's plan to continue treatment at [facility] or transfer to [facility].   Roselyn Reef returned my call and stated she is not sure of what patient's decision will ultimately be, but she did encourage patient to keep appt on 02/10/20. I thanked Roselyn Reef for her return call and for the information.   Carol Ada, RT(R)(T) Clinical Research Coordinator

## 2020-02-06 NOTE — Telephone Encounter (Signed)
[id number]: Neuropathy  02/05/20    4:45 PM 02/06/20 10:00 AM  PHONE: Attempted to reach patient to try and see if she would be able to come in and complete her visit on 02/06/20 in order to stay in window for [id number]. Patient did not answer--LVM on 11/11 and attempted to call on 11/12.   PLAN: Plan is to see patient and do neuropathy on her originally scheduled date of 02/10/20.   Carol Ada, RT(R)(T) Clinical Research Coordinator

## 2020-02-09 NOTE — Progress Notes (Signed)
[facility]   [phone] [phone]   Clinic Follow up Note   Patient Care Team: Delsa Bern, MD as PCP - General (Obstetrics and Gynecology) Richrd Prime as Consulting Physician (Obstetrics and Gynecology) Mauro Kaufmann, RN as Oncology Nurse Navigator Rockwell Germany, RN as Oncology Nurse Navigator Stark Klein, MD as Consulting Physician (General Surgery) Truitt Merle, MD as Consulting Physician (Hematology) Kyung Rudd, MD as Consulting Physician (Radiation Oncology) Register, Luetta Nutting, PA-C as Physician Assistant (Dermatology) [name] Going, DO as Attending Physician (Plastic Surgery) 02/10/2020  CHIEF COMPLAINT: Follow-up right breast cancer  SUMMARY OF ONCOLOGIC HISTORY: Oncology History Overview Note  Cancer Staging Malignant neoplasm of upper-outer quadrant of right breast in female, estrogen receptor positive (Stephanie Chase) Staging form: Breast, AJCC 8th Edition - Clinical stage from 07/30/2019: Stage IA (cT1c, cN0, cM0, G3, ER+, PR-, HER2+) - Unsigned    Malignant neoplasm of upper-outer quadrant of right breast in female, estrogen receptor positive (Stephanie Chase)  07/03/2019 Mammogram   Diagnostic Mammogram  IMPRESSION There is a 1.3x1.1cm focal asymmetry in the retroareolar anterior to middle depth right breast 2.2cm from the nipple.  There is a 2 cm focal asymmetry with appearance in the upper outer quadrant posterior depth of right breast 3 cm from nipple -Both suspicious and biopsy recommended.     07/18/2019 Initial Biopsy   Diagnosis 07/18/19 1. Breast, right, needle core biopsy, 12 o'clock - INVASIVE DUCTAL CARCINOMA. 2. Breast, right, needle core biopsy, upper outer quadrant - DUCTAL CARCINOMA IN SITU. Microscopic Comment 1. The invasive carcinoma is nuclear grade 3. The greatest linear extent of tumor in any one core is 11 mm. Ancillary studies will be reported separately. CKAE1AE3, GATA-3, ER and E-cadherin are positive. PAX 8 is  negative. CK7 is noncontributory. 2. The in situ carcinoma is high nuclear grade with central necrosis and calcifications. E-cadherin is positive. P63, Calponin and SMM-1 demonstrate the present of myoepithelium.   07/18/2019 Receptors her2   1. PROGNOSTIC INDICATORS 07/18/19 Results: IMMUNOHISTOCHEMICAL AND MORPHOMETRIC ANALYSIS PERFORMED MANUALLY The tumor cells are POSITIVE for Her2 (3+). Of note, the tumor shows heterogeneity in regards to Her2 expression. Estrogen Receptor: 95%, POSITIVE, STRONG STAINING INTENSITY Progesterone Receptor: 0%, NEGATIVE Proliferation Marker Ki67: 50%   07/24/2019 Initial Diagnosis   Malignant neoplasm of upper-outer quadrant of right breast in female, estrogen receptor positive (Stephanie Chase)   08/01/2019 Breast MRI   IMPRESSION: 1. Known invasive ductal carcinoma measures 1.2 centimeters in the 12 o'clock location. 2. Non mass enhancement measures 2.3 centimeters in the LATERAL portion of the RIGHT breast corresponding to DCIS on biopsy. 3. An additional mass in the UPPER OUTER QUADRANT of the RIGHT breast is 1.5 centimeters and warrants tissue diagnosis. This area correlates with distortion seen mammographically. 4. No axillary adenopathy or ancillary findings. LEFT breast is negative.     08/04/2019 Echocardiogram   IMPRESSIONS     1. Left ventricular ejection fraction, by estimation, is 65 to 70%. The  left ventricle has normal function. The left ventricle has no regional  wall motion abnormalities. Left ventricular diastolic parameters were  normal. The average left ventricular  global longitudinal strain is -20.4 %.   2. Right ventricular systolic function is normal. The right ventricular  size is normal.   3. The mitral valve is normal in structure. No evidence of mitral valve  regurgitation. No evidence of mitral stenosis.   4. The aortic valve is normal in structure. Aortic valve regurgitation is  not visualized. No aortic stenosis  is present.     08/10/2019 Genetic Testing   Negative genetic testing:  No pathogenic variants detected on the [facility] Breast Cancer STAT panel + Common Hereditary Cancers panel. A variant of uncertain significance (VUS) was detected in the ATM gene called c.3834C>A. The report date is 08/10/2019.  The Breast Cancer STAT Panel offered by [facility] includes sequencing and deletion/duplication analysis for the following 9 genes:  ATM, BRCA1, BRCA2, CDH1, CHEK2, PALB2, PTEN, STK11 and TP53. The Common Hereditary Cancers Panel offered by [facility] includes sequencing and/or deletion duplication testing of the following 48 genes: APC, ATM, AXIN2, BARD1, BMPR1A, BRCA1, BRCA2, BRIP1, CDH1, CDK4, CDKN2A (p14ARF), CDKN2A (p16INK4a), CHEK2, CTNNA1, DICER1, EPCAM (Deletion/duplication testing only), GREM1 (promoter region deletion/duplication testing only), KIT, MEN1, MLH1, MSH2, MSH3, MSH6, MUTYH, NBN, NF1, NHTL1, PALB2, PDGFRA, PMS2, POLD1, POLE, PTEN, RAD50, RAD51C, RAD51D, RNF43, SDHB, SDHC, SDHD, SMAD4, SMARCA4. STK11, TP53, TSC1, TSC2, and VHL.  The following genes were evaluated for sequence changes only: SDHA and HOXB13 c.251G>A variant only.    09/05/2019 Surgery   BILATERAL MASTECTOMY WITH RIGHT SENTINEL LYMPH NODE BIOPSY and PAC placement by Dr Barry Dienes    09/05/2019 Pathology Results   FINAL MICROSCOPIC DIAGNOSIS:   A. BREAST, LEFT, MASTECTOMY:  - Benign breast tissue.   B. BREAST, RIGHT, MASTECTOMY:  - Invasive ductal carcinoma, multifocal, 1.4 cm in greatest dimension,  Nottingham grade 3 of 3.  - Ductal carcinoma in situ, high nuclear grade with central necrosis and  calcifications.  - Margins of resection are not involved.  - Biopsy sites.  - See oncology table.   C. SENTINEL LYMPH NODE, RIGHT AXILLARY #1, BIOPSY:  - One lymph node, negative for carcinoma (0/1).   D. SENTINEL LYMPH NODE, RIGHT AXILLARY #2, BIOPSY:  - One lymph node, negative for carcinoma (0/1).   E. SENTINEL LYMPH NODE, RIGHT AXILLARY  #3, BIOPSY:  - One lymph node, negative for carcinoma (0/1).     Estrogen Receptor: 95%, positive, strong             Progesterone Receptor: 0%, negative             HER2: Positive (3+)             Ki-67: 50%    09/05/2019 Cancer Staging   Staging form: Breast, AJCC 8th Edition - Pathologic stage from 09/05/2019: Stage IA (pT1c, pN0, cM0, G3, ER+, PR-, HER2+) - Signed by Gardenia Phlegm, NP on 09/17/2019   10/22/2019 Surgery   Excision of right breast wound and APPLICATION OF A-CELL, INTEGRA BWM OR CELLERATE by Dr Marla Roe on 10/22/19   10/27/2019 -  Chemotherapy   Weekly Taxol and Herceptin for 12 weeks starting 10/27/19 then maintenance Herceptin q3weeks to complete 1 year treatment. Given recent right breast infection and debridement, will start Herceptin on 10/27/19 with loading dose (q2weeks) and add Taxol with week 3 on 11/10/19 then continuing with both weekly.  -Due to tinnitus, her Kanjinti (Herceptin) was changed to weekly Trazimera on 11/24/19.      CURRENT THERAPY:  Weekly Taxol andTrazimerafor12 weeks 10/27/19-01/26/20 followed by maintenance Trazimera q3 weeks to complete 1 year. Taxol started on 11/10/19 due to right mastectomy wound necrosis s/p debridement   INTERVAL HISTORY: Ms. Landgren returns for follow-up and treatment as scheduled.  She completed her last cycle of Taxol/Trazimera on 11/1.  She postponed today's treatment to be out of town with her mother.  She continues to have back aches and "charley horses all over" if she sits or  rides in a car for more than an hour.  Robaxin is not helpful, she tries to avoid taking oxycodone.  Back pain has been present but stable since starting chemo.  She also has GERD, Protonix is not helpful.  Denies nausea or vomiting.  Stools are soft to watery up to 5/day while on chemo, she has avoided Imodium due to being prone to constipation before.  She is eating and drinking well.  Thrush resolved.  Her dry eyes and inflammation are  slightly improved on eyedrops, she was told not to take longer than 2 weeks.  Her baseline cough has become more frequent, dry, no chest pain, fever, chills.  She has mild dyspnea at rest and on exertion.  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  She has no functional difficulties.  She is having FMLA/insurance issues and needs dental clearance letter.   MEDICAL HISTORY:  Past Medical History:  Diagnosis Date  . Anemia   . Anxiety   . Asthma   . Breast cancer ([place])   . COPD (chronic obstructive pulmonary disease) ([place])   . Discoid lupus   . Family history of bladder cancer   . Family history of BRCA gene mutation   . Family history of breast cancer   . Family history of prostate cancer   . GERD (gastroesophageal reflux disease)   . Hypothyroidism   . Pneumonia   . Thyroid disease     SURGICAL HISTORY: Past Surgical History:  Procedure Laterality Date  . ABLATION ON ENDOMETRIOSIS    . APPLICATION OF A-CELL OF EXTREMITY Right 10/22/2019   Procedure: EXCISION OF RIGHT BREAST WOUND WITH PRIMARY CLOSURE;  Surgeon: [name] Going, DO;  Location: [facility];  Service: Plastics;  Laterality: Right;  . BREAST LUMPECTOMY    . DEBRIDEMENT AND CLOSURE WOUND Right 10/22/2019   Procedure: Excision of right breast wound;  Surgeon: [name] Going, DO;  Location: [facility];  Service: Plastics;  Laterality: Right;  . MASTECTOMY W/ SENTINEL NODE BIOPSY Bilateral 09/05/2019   Procedure: BILATERAL MASTECTOMY WITH RIGHT SENTINEL LYMPH NODE BIOPSY;  Surgeon: Stark Klein, MD;  Location: [facility];  Service: General;  Laterality: Bilateral;  COMBINED WITH REGIONAL FOR POST OP PAIN  . PORTACATH PLACEMENT Left 09/05/2019   Procedure: INSERTION PORT-A-CATH WITH ULTRASOUND GUIDANCE;  Surgeon: Stark Klein, MD;  Location: [facility];  Service: General;  Laterality: Left;  . TONSILLECTOMY    . TUBAL LIGATION    . WISDOM TOOTH EXTRACTION      I have  reviewed the social history and family history with the patient and they are unchanged from previous note.  ALLERGIES:  is allergic to bactrim [sulfamethoxazole-trimethoprim].  MEDICATIONS:  Current Outpatient Medications  Medication Sig Dispense Refill  . acetaminophen (TYLENOL) 500 MG tablet Take 500-1,000 mg by mouth every 6 (six) hours as needed for moderate pain.     Marland Kitchen albuterol (VENTOLIN HFA) 108 (90 Base) MCG/ACT inhaler Inhale 2 puffs into the lungs every 6 (six) hours as needed for wheezing or shortness of breath. 8 g 2  . ALPRAZolam (XANAX) 0.25 MG tablet Take 1 tablet (0.25 mg total) by mouth daily as needed for anxiety. 30 tablet 0  . Ascorbic Acid (VITAMIN C) 1000 MG tablet Take 1,000 mg by mouth daily.    . cetirizine (ZYRTEC) 10 MG tablet Take 10 mg by mouth daily as needed for allergies.     . Cholecalciferol (  VITAMIN D) 50 MCG (2000 UT) tablet Take 2,000 Units by mouth daily.    Marland Kitchen Dexlansoprazole (DEXILANT) 30 MG capsule Take 1 capsule (30 mg total) by mouth daily. 30 capsule 1  . dextromethorphan (DELSYM) 30 MG/5ML liquid Take 60 mg by mouth 2 (two) times daily as needed for cough.     . fluticasone (FLONASE) 50 MCG/ACT nasal spray Place 1 spray into both nostrils daily as needed for allergies.     . fluticasone (FLONASE) 50 MCG/ACT nasal spray Place 2 sprays into both nostrils in the morning and at bedtime. 16 g 5  . fluticasone (FLOVENT HFA) 110 MCG/ACT inhaler Inhale 2 puffs into the lungs in the morning and at bedtime. 1 each 12  . levothyroxine (SYNTHROID) 75 MCG tablet Take 75 mcg by mouth daily before breakfast.    . lidocaine-prilocaine (EMLA) cream Apply 1 application topically as needed. (Patient taking differently: Apply 1 application topically daily as needed (port access). ) 30 g 1  . liothyronine (CYTOMEL) 5 MCG tablet TAKE 2 TABLETS (=10MCG     TOTAL)     DAILY (Patient taking differently: Take 10 mcg by mouth daily. ) 180 tablet 1  . Multiple Vitamin  (MULTIVITAMIN WITH MINERALS) TABS tablet Take 1 tablet by mouth 2 (two) times daily.     . nicotine (NICODERM CQ - DOSED IN MG/24 HOURS) 21 mg/24hr patch Place 21 mg onto the skin daily.    Marland Kitchen nystatin (MYCOSTATIN) 100000 UNIT/ML suspension Take 5 mLs (500,000 Units total) by mouth 4 (four) times daily. 473 mL 1  . OVER THE COUNTER MEDICATION Take 1 each by mouth 2 (two) times daily as needed (anxiety / sleep). CBD Gummies    . oxyCODONE (OXY IR/ROXICODONE) 5 MG immediate release tablet Take 1 tablet (5 mg total) by mouth every 6 (six) hours as needed for breakthrough pain. 20 tablet 0  . baclofen (LIORESAL) 10 MG tablet Take 1 tablet (10 mg total) by mouth 2 (two) times daily as needed for muscle spasms. 30 each 1  . ondansetron (ZOFRAN) 8 MG tablet Take 1 tablet (8 mg total) by mouth 2 (two) times daily as needed (Nausea or vomiting). (Patient not taking: Reported on 02/10/2020) 30 tablet 1  . prochlorperazine (COMPAZINE) 10 MG tablet Take 1 tablet (10 mg total) by mouth every 6 (six) hours as needed (Nausea or vomiting). (Patient not taking: Reported on 02/10/2020) 30 tablet 1   No current facility-administered medications for this visit.   Facility-Administered Medications Ordered in Other Visits  Medication Dose Route Frequency Provider Last Rate Last Admin  . alum & mag hydroxide-simeth (MAALOX/MYLANTA) 200-200-20 MG/5ML suspension 30 mL  30 mL Oral Once Sandi Mealy E., PA-C      . sodium chloride flush (NS) 0.9 % injection 10 mL  10 mL Intracatheter PRN Truitt Merle, MD   10 mL at 02/10/20 1041    PHYSICAL EXAMINATION: ECOG PERFORMANCE STATUS: 1 - Symptomatic but completely ambulatory  Vitals:   02/10/20 0818  BP: 123/87  Pulse: 75  Resp: 18  Temp: 98.1 F (36.7 C)  SpO2: 99%   Filed Weights   02/10/20 0818  Weight: 170 lb 11.2 oz (77.4 kg)    GENERAL:alert, no distress and comfortable SKIN: No rash EYES: sclera clear OROPHARYNX: Thin white film at the base of tongue, no  thrush or ulcers LUNGS: clear with normal breathing effort HEART: regular rate & rhythm, no lower extremity edema NEURO: alert & oriented x 3 with fluent speech,  no focal motor/sensory deficits.  Intact peripheral vibratory sense over the left foot per tuning fork exam PAC without erythema S/p bilateral mastectomy, incisions completely healed with normal-appearing scars  LABORATORY DATA:  I have reviewed the data as listed CBC Latest Ref Rng & Units 02/10/2020 01/26/2020 01/19/2020  WBC 4.0 - 10.5 K/uL 10.5 10.8(H) 10.3  Hemoglobin 12.0 - 15.0 g/dL 12.6 12.6 12.5  Hematocrit 36 - 46 % 37.5 37.1 37.1  Platelets 150 - 400 K/uL 296 312 339     CMP Latest Ref Rng & Units 02/10/2020 01/26/2020 01/19/2020  Glucose 70 - 99 mg/dL 89 83 89  BUN 6 - 20 mg/dL '11 12 12  ' Creatinine 0.44 - 1.00 mg/dL 0.69 0.78 0.71  Sodium 135 - 145 mmol/L 140 139 136  Potassium 3.5 - 5.1 mmol/L 4.0 4.0 4.1  Chloride 98 - 111 mmol/L 108 105 105  CO2 22 - 32 mmol/L '26 27 25  ' Calcium 8.9 - 10.3 mg/dL 9.9 10.2 10.0  Total Protein 6.5 - 8.1 g/dL 6.9 6.8 6.9  Total Bilirubin 0.3 - 1.2 mg/dL 0.5 0.4 0.3  Alkaline Phos 38 - 126 U/L 93 91 90  AST 15 - 41 U/L '26 25 19  ' ALT 0 - 44 U/L '22 22 17      ' RADIOGRAPHIC STUDIES: I have personally reviewed the radiological images as listed and agreed with the findings in the report. No results found.   ASSESSMENT & PLAN: Stephanie Chase a 60 y.o.femalewith   1.Malignant neoplasm of upper-outer quadrant of right breast,invasive ductal carcinoma and DCIS,StageIA,pT1cN0M0,ER+/HER2+, PR-,Grade III -She was diagnosed in 06/2019.Given multifocal disease, she underwent right mastectomy and SLNBandprophylactic left mastectomyby Dr Barry Dienes on 09/05/19.Left chest port placed during surgery. -She developed right mastectomy wound and necrosis, s/p wound debridement by Dr. Audelia Hives on 10/22/19, she healed completely -Due to node-negative, postmastectomy radiation is  not recommended -Given her HER2 positive high grade disease, adjuvant chemotherapy is recommended to reduce her high risk of recurrence. Given her small tumorand negative node,Dr. Burr Medico recommendedless intensiveadjuvantchemotherapy with weekly Taxol and anti-HER2 Herceptin for 12 weeks and continue maintenance Herceptinq3weeksalone to complete 1 year treatment.She has the option of Herceptin injections after chemo.She consented to treatment. Goal is curative. -plan to begin adjuvant anti-estrogen therapy after chemo -due to right mastectomy wound necrosis s/p debridement, chemo was postponed; she started herceptin with loading dose on 8/2, then skipped a week and added taxol with week 3, she completed 10/27/19 - 01/26/20 -She developed early neuropathy after week 9 Taxol, no functional difficulties or sensory deficits. Taxol was dose-reduced. G1 neuropathy remained stable. On neuropathy study  -she began maintenance herceptin q3 weeks starting 11/16  -pending antiestrogens   2. Genetic Testing  -She has family history of breast, bladder, prostate cancer. -She does have negative genetic testing: No pathogenic variants detected on the [facility] Breast Cancer STAT panel + Common Hereditary Cancers panel. A variant of uncertain significance (VUS) was detected in the ATM gene called c.3834C>A. The report date is 08/10/2019.She can repeat testing in 5-10 years.  3. Smoking Cessation, COPD -She has been smoking since the 2nd grade. She has been able to cut down recentlyto half pack per day.  -Per pt at some point shewas diagnosed with COPD or Emphysema. She is on albuterol inhaler and Singulair. -Her right mastectomy wound necrosis ispossiblyrelated to smoking -she has cut back, using nicotine patch.  4. Comorbidities:DiscoidLupus, Vitiligo, Thyroid disease.  -She was diagnosed with Lupus 10 years ago. She uses steroid cream  as needed for her lupus rashes, some skin lesions of which  have been removed. -She sees her dermatologist Dr Nevada Crane and their [place].  -She is on 2 thyroid medications.  5. Social Support, Anxiety  -Ongoing -Startedonlow doseXanaxPRN5/07/2019, okay to take before chemo. -She previously declined social work referral -She is having difficulties with FMLA, insurance, and declining request to extend time off for treatment-related side effects. I wrote her insurance a letter  6.  HEENT: Hearing loss and tinnitus, dry eyes and inflammation -Tinnitus began after starting adjuvant chemo, ENT feels this is more a side effect of her hearing loss rather than chemo and recommends a hearing aid -on eye drops per ophthalmology, may be related to taxol ?, improving   7. Health maintenance  -she has dental cleaning and colonoscopy scheduled in 02/2020 -I wrote a letter for dental clearance  Disposition:  Ms. Novakovich appears stable. She completed weekly taxol/trazimera on 11/1. She tolerated moderately well with tinnitus, dry eye/inflammation, GERD, back pain and muscle cramps, G1 neuropathy, and mild diarrhea. She is recovering well.   We reviewed symptom management in depth. She will continue HEENT f/u and eye drops, change protonix to dexilant and robaxin to baclofen, and use imodium PRN. She agrees to ween oxycodone.   G1 neuropathy remained stable on dose-reduced Taxol. On neuropathy study, seen by Carol Ada today and forms completed.   The CBC and CMP are stable, adeuqate to proceed with maintenance Trazimera only today, continue q3 weeks. Next echo and cardiology f/u 12/2.   Given the strong ER positivity, I do recommend adjuvant anti-estrogen to reduce her risk of cancer recurrence.  The potential benefit and side effects, which include but not limited to hot flash, skin and vaginal dryness, metabolic changes ( increased blood glucose, cholesterol, weight, etc.), slightly in increased risk of cardiovascular disease, cataracts,  muscular and joint discomfort, osteopenia and osteoporosis, etc, were discussed with her in great detail. She is interested but not ready to make a decision. Will obtain baseline DEXA in the meantime. If she has severe osteopenia or osteoporosis, will recommend tamoxifen.   Dental clearance and insurance/FMLA letters sent to patient via MyChart. Continue with routine health maintenance and other age appropriate cancer screenings. Colonoscopy scheduled 03/10/20 with Dr. Collene Mares.   She has second opinion consult with Dr. Marin Olp on 11/17. She will let us know if she plans to transfer her care.    Orders Placed This Encounter  Procedures  . DG Bone Density    Standing Status:   Future    Standing Expiration Date:   02/09/2021    Order Specific Question:   Reason for Exam (SYMPTOM  OR DIAGNOSIS REQUIRED)    Answer:   baseline, r/o osteopenia/porosis before AI    Order Specific Question:   Is the patient pregnant?    Answer:   No    Order Specific Question:   Preferred imaging location?    Answer:   [facility]   All questions were answered. The patient knows to call the clinic with any problems, questions or concerns. No barriers to learning were detected. Total encounter time was 40 minutes.      Alla Feeling, NP 02/10/20

## 2020-02-10 ENCOUNTER — Inpatient Hospital Stay: Payer: BC Managed Care – PPO

## 2020-02-10 ENCOUNTER — Encounter: Payer: Self-pay | Admitting: Nurse Practitioner

## 2020-02-10 ENCOUNTER — Encounter: Payer: Self-pay | Admitting: *Deleted

## 2020-02-10 ENCOUNTER — Telehealth: Payer: Self-pay | Admitting: Nurse Practitioner

## 2020-02-10 ENCOUNTER — Inpatient Hospital Stay (HOSPITAL_BASED_OUTPATIENT_CLINIC_OR_DEPARTMENT_OTHER): Payer: BC Managed Care – PPO | Admitting: Nurse Practitioner

## 2020-02-10 ENCOUNTER — Encounter: Payer: Self-pay | Admitting: Radiology

## 2020-02-10 ENCOUNTER — Other Ambulatory Visit: Payer: Self-pay

## 2020-02-10 VITALS — BP 123/87 | HR 75 | Temp 98.1°F | Resp 18 | Ht 67.0 in | Wt 170.7 lb

## 2020-02-10 DIAGNOSIS — Z170 Estrogen receptor positive status [ER+]: Secondary | ICD-10-CM

## 2020-02-10 DIAGNOSIS — C50411 Malignant neoplasm of upper-outer quadrant of right female breast: Secondary | ICD-10-CM | POA: Diagnosis not present

## 2020-02-10 DIAGNOSIS — Z006 Encounter for examination for normal comparison and control in clinical research program: Secondary | ICD-10-CM

## 2020-02-10 DIAGNOSIS — Z5112 Encounter for antineoplastic immunotherapy: Secondary | ICD-10-CM | POA: Diagnosis not present

## 2020-02-10 LAB — CBC WITH DIFFERENTIAL (CANCER CENTER ONLY)
Abs Immature Granulocytes: 0.03 10*3/uL (ref 0.00–0.07)
Basophils Absolute: 0.1 10*3/uL (ref 0.0–0.1)
Basophils Relative: 1 %
Eosinophils Absolute: 0.1 10*3/uL (ref 0.0–0.5)
Eosinophils Relative: 1 %
HCT: 37.5 % (ref 36.0–46.0)
Hemoglobin: 12.6 g/dL (ref 12.0–15.0)
Immature Granulocytes: 0 %
Lymphocytes Relative: 15 %
Lymphs Abs: 1.5 10*3/uL (ref 0.7–4.0)
MCH: 29.9 pg (ref 26.0–34.0)
MCHC: 33.6 g/dL (ref 30.0–36.0)
MCV: 89.1 fL (ref 80.0–100.0)
Monocytes Absolute: 0.8 10*3/uL (ref 0.1–1.0)
Monocytes Relative: 7 %
Neutro Abs: 8 10*3/uL — ABNORMAL HIGH (ref 1.7–7.7)
Neutrophils Relative %: 76 %
Platelet Count: 296 10*3/uL (ref 150–400)
RBC: 4.21 MIL/uL (ref 3.87–5.11)
RDW: 15.3 % (ref 11.5–15.5)
WBC Count: 10.5 10*3/uL (ref 4.0–10.5)
nRBC: 0 % (ref 0.0–0.2)

## 2020-02-10 LAB — CMP (CANCER CENTER ONLY)
ALT: 22 U/L (ref 0–44)
AST: 26 U/L (ref 15–41)
Albumin: 3.6 g/dL (ref 3.5–5.0)
Alkaline Phosphatase: 93 U/L (ref 38–126)
Anion gap: 6 (ref 5–15)
BUN: 11 mg/dL (ref 6–20)
CO2: 26 mmol/L (ref 22–32)
Calcium: 9.9 mg/dL (ref 8.9–10.3)
Chloride: 108 mmol/L (ref 98–111)
Creatinine: 0.69 mg/dL (ref 0.44–1.00)
GFR, Estimated: >60 mL/min (ref >60)
Glucose, Bld: 89 mg/dL (ref 70–99)
Potassium: 4 mmol/L (ref 3.5–5.1)
Sodium: 140 mmol/L (ref 135–145)
Total Bilirubin: 0.5 mg/dL (ref 0.3–1.2)
Total Protein: 6.9 g/dL (ref 6.5–8.1)

## 2020-02-10 MED ORDER — SODIUM CHLORIDE 0.9 % IV SOLN
Freq: Once | INTRAVENOUS | Status: AC
  Filled 2020-02-10: qty 250

## 2020-02-10 MED ORDER — SODIUM CHLORIDE 0.9% FLUSH
10.0000 mL | INTRAVENOUS | Status: DC | PRN
  Administered 2020-02-10: 10 mL
  Filled 2020-02-10: qty 10

## 2020-02-10 MED ORDER — DEXILANT 30 MG PO CPDR
30.0000 mg | DELAYED_RELEASE_CAPSULE | Freq: Every day | ORAL | 1 refills | Status: DC
Start: 2020-02-10 — End: 2020-12-16

## 2020-02-10 MED ORDER — BACLOFEN 10 MG PO TABS: 10.0000 mg | ORAL_TABLET | Freq: Two times a day (BID) | ORAL | 1 refills | Status: DC | PRN

## 2020-02-10 MED ORDER — DIPHENHYDRAMINE HCL 25 MG PO CAPS
25.0000 mg | ORAL_CAPSULE | Freq: Once | ORAL | Status: AC
  Administered 2020-02-10: 25 mg via ORAL

## 2020-02-10 MED ORDER — HEPARIN SOD (PORK) LOCK FLUSH 100 UNIT/ML IV SOLN
500.0000 [IU] | Freq: Once | INTRAVENOUS | Status: AC | PRN
  Administered 2020-02-10: 500 [IU]
  Filled 2020-02-10: qty 5

## 2020-02-10 MED ORDER — TRASTUZUMAB-QYYP CHEMO 150 MG IV SOLR: 450.0000 mg | Freq: Once | INTRAVENOUS | Status: DC

## 2020-02-10 MED ORDER — DIPHENHYDRAMINE HCL 25 MG PO CAPS
ORAL_CAPSULE | ORAL | Status: AC
  Filled 2020-02-10: qty 1

## 2020-02-10 MED ORDER — ACETAMINOPHEN 325 MG PO TABS
ORAL_TABLET | ORAL | Status: AC
  Filled 2020-02-10: qty 2

## 2020-02-10 MED ORDER — ACETAMINOPHEN 325 MG PO TABS
650.0000 mg | ORAL_TABLET | Freq: Once | ORAL | Status: AC
  Administered 2020-02-10: 650 mg via ORAL

## 2020-02-10 MED ORDER — TRASTUZUMAB-QYYP CHEMO 150 MG IV SOLR
450.0000 mg | Freq: Once | INTRAVENOUS | Status: AC
  Administered 2020-02-10: 450 mg via INTRAVENOUS
  Filled 2020-02-10: qty 21.43

## 2020-02-10 NOTE — Patient Instructions (Signed)

## 2020-02-10 NOTE — Patient Instructions (Signed)
[facility] Discharge Instructions for Patients Receiving Chemotherapy  Today you received the following agents: Trastuzumab  To help prevent nausea and vomiting after your treatment, we encourage you to take your nausea medication as directed   If you develop nausea and vomiting that is not controlled by your nausea medication, call the clinic.   BELOW ARE SYMPTOMS THAT SHOULD BE REPORTED IMMEDIATELY:  *FEVER GREATER THAN 100.5 F  *CHILLS WITH OR WITHOUT FEVER  NAUSEA AND VOMITING THAT IS NOT CONTROLLED WITH YOUR NAUSEA MEDICATION  *UNUSUAL SHORTNESS OF BREATH  *UNUSUAL BRUISING OR BLEEDING  TENDERNESS IN MOUTH AND THROAT WITH OR WITHOUT PRESENCE OF ULCERS  *URINARY PROBLEMS  *BOWEL PROBLEMS  UNUSUAL RASH Items with * indicate a potential emergency and should be followed up as soon as possible.  Feel free to call the clinic should you have any questions or concerns. The clinic phone number is [phone].  Please show the [place] at check-in to the Emergency Department and triage nurse.

## 2020-02-10 NOTE — Research (Signed)
C0221, A PROSPECTIVE OBSERVATIONAL COHORT STUDY TO DEVELOP A PREDICTIVE MODEL OFTAXANE-INDUCED PERIPHERAL NEUROPATHY IN CANCER PATIENTS.  02/10/2020 8:50 AM  12 WEEKASSESSMENT:  Supplements, Topical Agents and Other Treatments:Reviewed with patient and CRFs completed.Patient is takingacetaminophen and Ibuprofen but not for neuropathy symptoms.She recently just began a Vitamin B complex per recommendation from MD.She is not using menthol or any other topical creams, compression gloves/socks, cooling gloves, or socks or acupuncture.  Treatment Regimen: 12 cycles of Taxol was completed on 01/26/20 with a cumulative dose of 1626 mg. Patient received maintenance Trazimera and will receive this every 3 weeks for 1 year.   Neuropen Assessment:Completed per protocol byCameo Windham,certified research RNwith assistance recording bymyself.   Tuning Fork Assessment:Completed per protocol by [employer] RNwith assistance timing and recording bymyself.  PROs: All PROs were given to patient to complete by patient in infusion. Collected PROs once patient was complete and checked for completeness.   Physician Assessments:CTCAE and Treatment [place] forms completed and signed Angie Fava, NP and cosigned by Dr. Burr Medico.Patient stated she has begun having neuropathy primarily in her left foot. She described this neuropathy as burning at time and numbness at times.   Plan:Patient plans on seeking second opinion in at [facility] tomorrow (02/11/2020). I will touch base with patient after this visit to see if she would like to continue on the neuropathy study. I thanked patient for her time and I appreciate her taking part in this research study.   Carol Ada, RT(R)(T) Clinical Research Coordinator

## 2020-02-10 NOTE — Progress Notes (Signed)
[place] [facility] and [facility] Counseling Note   Patient informed counselor that she was seeking a second opinion from another oncologist and was still frustrated about how her treatment was being handled. She had talked to her NP and felt more understood, but is still seeking for someone to be her "advocate" in making sure she gets the best treatment. Patient noted the excessive stress and the unknown stemming from her situations. Patient described feeling lost and not knowing how to put herself first. Patient and counselor had a discussion on coping, where counselor introduced a scheduled cry, the unwanted party guest metaphor, and journaling. Patient showed no SI/HI/NSSI and was oriented times three in the session. Her mood was sad and her affect was flat. Additionally, this session was right after a treatment, so her brain was "not working" well. Patient is overwhelmed and has a lot of current stressors. Thoughts of "I can't put myself first or fall apart" have caused distress, shame and other unhelpful feelings, which result in the patient ignoring her own emotional needs. Next session will explore self-care and planning for that.     Gaylyn Rong Counseling Intern

## 2020-02-10 NOTE — Telephone Encounter (Signed)
Scheduled per 11/16 los. Pt is aware of appt times and date.

## 2020-02-11 ENCOUNTER — Encounter: Payer: Self-pay | Admitting: Hematology & Oncology

## 2020-02-11 ENCOUNTER — Telehealth: Payer: Self-pay

## 2020-02-11 ENCOUNTER — Encounter: Payer: Self-pay | Admitting: *Deleted

## 2020-02-11 ENCOUNTER — Other Ambulatory Visit: Payer: Self-pay

## 2020-02-11 ENCOUNTER — Inpatient Hospital Stay (HOSPITAL_BASED_OUTPATIENT_CLINIC_OR_DEPARTMENT_OTHER): Payer: BC Managed Care – PPO | Admitting: Hematology & Oncology

## 2020-02-11 VITALS — BP 135/96 | HR 81 | Temp 97.6°F | Resp 20 | Wt 170.8 lb

## 2020-02-11 DIAGNOSIS — Z5112 Encounter for antineoplastic immunotherapy: Secondary | ICD-10-CM | POA: Diagnosis not present

## 2020-02-11 DIAGNOSIS — C50411 Malignant neoplasm of upper-outer quadrant of right female breast: Secondary | ICD-10-CM

## 2020-02-11 DIAGNOSIS — Z170 Estrogen receptor positive status [ER+]: Secondary | ICD-10-CM

## 2020-02-11 DIAGNOSIS — E559 Vitamin D deficiency, unspecified: Secondary | ICD-10-CM | POA: Diagnosis not present

## 2020-02-11 DIAGNOSIS — E538 Deficiency of other specified B group vitamins: Secondary | ICD-10-CM

## 2020-02-11 NOTE — Progress Notes (Signed)
Hematology and Oncology Follow Up Visit  Stephanie Chase [id number] [birth year] 59 y.o. 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  She would like to change facilities.  She felt that the [facility] was just too busy.  She likes to have more privacy.  She was followed very expertly by Dr. Burr Medico.  She is found to have breast cancer in the right breast on a mammogram.  She ultimately underwent bilateral mastectomies.  She is negative for the BRCA gene.  I think her mother had breast cancer but she was postmenopausal.  She underwent adjuvant therapy with weekly Taxol and Herceptin.  She did okay with this.  She still has some alopecia.  She says she has tinnitus.  She also has worsening vitiligo.  She has been having some pain in the bones.  She has seen ENT.  Dr. Lucia Gaskins did not think that the tinnitus were from the chemotherapy.  She also has a litany of other medical issues.  She currently completed the chemotherapy with Herceptin in early November.  She is now on maintenance Herceptin.  She apparently needed a second surgery over on the right mastectomy area because of I think infection.  She is getting her strength back a little bit.  I told her probably take a year before she will get her strength totally back.  She has had no problems with bowels or bladder.  She has had no leg swelling.  I do think she has had any neuropathy.  She does have some cracking in the fingers.  Again I have not heard of this with Taxol.  She is using some moisturizer for this.  She works for a [employer]ch[employer], retail.  She works for [employer].  I would have to  say currently that her performance status is probably ECOG 1.  Medications:  Current Outpatient Medications:  .  acetaminophen (TYLENOL) 500 MG tablet, Take 500-1,000 mg by mouth every 6 (six) hours as needed for moderate pain. , Disp: , Rfl:  .  albuterol (VENTOLIN HFA) 108 (90 Base) MCG/ACT inhaler, Inhale 2 puffs into the lungs every 6 (six) hours as needed for wheezing or shortness of breath., Disp: 8 g, Rfl: 2 .  ALPRAZolam (XANAX) 0.25 MG tablet, Take 1 tablet (0.25 mg total) by mouth daily as needed for anxiety., Disp: 30 tablet, Rfl: 0 .  baclofen (LIORESAL) 10 MG tablet, Take 1 tablet (10 mg total) by mouth 2 (two) times daily as needed for muscle spasms., Disp: 30 each, Rfl: 1 .  Cholecalciferol (VITAMIN D) 50 MCG (2000 UT) tablet, Take 2,000 Units by mouth daily., Disp: , Rfl:  .  fluticasone (FLONASE) 50 MCG/ACT nasal spray, Place 2 sprays into both nostrils in the morning and at bedtime., Disp: 16 g, Rfl: 5 .  fluticasone (FLOVENT HFA) 110 MCG/ACT inhaler, Inhale 2 puffs into the lungs in the morning and at bedtime., Disp: 1 each, Rfl: 12 .  levothyroxine (SYNTHROID) 75 MCG tablet, Take 75 mcg by mouth daily before breakfast., Disp: , Rfl:  .  lidocaine-prilocaine (EMLA) cream, Apply 1 application topically as needed. (Patient taking differently: Apply 1 application topically  daily as needed (port access). ), Disp: 30 g, Rfl: 1 .  liothyronine (CYTOMEL) 5 MCG tablet, TAKE 2 TABLETS (=10MCG     TOTAL)     DAILY (Patient taking differently: Take 10 mcg by mouth daily. ), Disp: 180 tablet, Rfl: 1 .  MAXITROL 3.5-10000-0.1 OINT, Apply 1 a thin layer on eyelid twice a day, Disp: , Rfl:  .  Ascorbic Acid (VITAMIN C) 1000 MG tablet, Take 1,000 mg by mouth daily. (Patient not taking: Reported on 02/11/2020), Disp: , Rfl:  .  cetirizine (ZYRTEC) 10 MG tablet, Take 10 mg by mouth daily as needed for allergies.  (Patient not taking: Reported on 02/11/2020), Disp: , Rfl:  .  Dexlansoprazole  (DEXILANT) 30 MG capsule, Take 1 capsule (30 mg total) by mouth daily. (Patient not taking: Reported on 02/11/2020), Disp: 30 capsule, Rfl: 1 .  dextromethorphan (DELSYM) 30 MG/5ML liquid, Take 60 mg by mouth 2 (two) times daily as needed for cough.  (Patient not taking: Reported on 02/11/2020), Disp: , Rfl:  .  erythromycin ophthalmic ointment, PLACE 1 APPILCATION INTO LEFT EYE 3 TIMES A DAY FOR 5 DAYS, Disp: , Rfl:  .  EYSUVIS 0.25 % SUSP, Instill 1 drop into both eyes   qid x 1 week, bid x 1 week, Disp: , Rfl:  .  fluticasone (CUTIVATE) 0.05 % cream, , Disp: , Rfl:  .  Multiple Vitamin (MULTIVITAMIN WITH MINERALS) TABS tablet, Take 1 tablet by mouth 2 (two) times daily.  (Patient not taking: Reported on 02/11/2020), Disp: , Rfl:  .  nicotine (NICODERM CQ - DOSED IN MG/24 HOURS) 21 mg/24hr patch, Place 21 mg onto the skin daily. (Patient not taking: Reported on 02/11/2020), Disp: , Rfl:  .  nystatin (MYCOSTATIN) 100000 UNIT/ML suspension, Take 5 mLs (500,000 Units total) by mouth 4 (four) times daily. (Patient not taking: Reported on 02/11/2020), Disp: 473 mL, Rfl: 1 .  ondansetron (ZOFRAN) 8 MG tablet, Take 1 tablet (8 mg total) by mouth 2 (two) times daily as needed (Nausea or vomiting). (Patient not taking: Reported on 02/10/2020), Disp: 30 tablet, Rfl: 1 .  PRED FORTE 1 % ophthalmic suspension, SMARTSIG:In Eye(s), Disp: , Rfl:  .  prochlorperazine (COMPAZINE) 10 MG tablet, Take 1 tablet (10 mg total) by mouth every 6 (six) hours as needed (Nausea or vomiting). (Patient not taking: Reported on 02/10/2020), Disp: 30 tablet, Rfl: 1 No current facility-administered medications for this visit.  Facility-Administered Medications Ordered in Other Visits:  .  alum & mag hydroxide-simeth (MAALOX/MYLANTA) 200-200-20 MG/5ML suspension 30 mL, 30 mL, Oral, Once, Tanner, Lyndon Code., PA-C  Allergies:  Allergies  Allergen Reactions  . Bactrim [Sulfamethoxazole-Trimethoprim] Itching and Rash    Past Medical  History, Surgical history, Social history, and Family History were reviewed and updated.  Review of Systems: Review of Systems  Constitutional: Positive for fatigue.  HENT:   Positive for hearing loss and tinnitus.   Eyes: Positive for eye problems.  Respiratory: Positive for cough.   Cardiovascular: Negative.   Gastrointestinal: Positive for abdominal pain and nausea.  Endocrine: Negative.   Genitourinary: Negative.    Musculoskeletal: Positive for arthralgias and myalgias.  Skin: Negative.   Neurological: Positive for light-headedness.  Hematological: Negative.   Psychiatric/Behavioral: Negative.     Physical Exam:  weight is 170 lb 12.8 oz (77.5 kg). Her oral temperature is 97.6 F (36.4 C). Her blood pressure is 135/96 (abnormal) and her pulse is 81. Her respiration is 20 and oxygen saturation is 98%.  Wt Readings from Last 3 Encounters:  02/11/20 170 lb 12.8 oz (77.5 kg)  02/10/20 170 lb 11.2 oz (77.4 kg)  01/19/20 168 lb 8 oz (76.4 kg)    Physical Exam Vitals reviewed.  Constitutional:      Comments: She has had bilateral mastectomies.  Mastectomy scars are well-healed.  There is no erythema or warmth or swelling associated with these.  I cannot palpate any bilateral axillary lymph nodes.  HENT:     Head: Normocephalic and atraumatic.  Eyes:     Pupils: Pupils are equal, round, and reactive to light.  Cardiovascular:     Rate and Rhythm: Normal rate and regular rhythm.     Heart sounds: Normal heart sounds.  Pulmonary:     Effort: Pulmonary effort is normal.     Breath sounds: Normal breath sounds.  Abdominal:     General: Bowel sounds are normal.     Palpations: Abdomen is soft.  Musculoskeletal:        General: No tenderness or deformity. Normal range of motion.     Cervical back: Normal range of motion.  Lymphadenopathy:     Cervical: No cervical adenopathy.  Skin:    General: Skin is warm and dry.     Findings: No erythema or rash.  Neurological:      Mental Status: She is alert and oriented to person, place, and time.  Psychiatric:        Behavior: Behavior normal.        Thought Content: Thought content normal.        Judgment: Judgment normal.      Lab Results  Component Value Date   WBC 10.5 02/10/2020   HGB 12.6 02/10/2020   HCT 37.5 02/10/2020   MCV 89.1 02/10/2020   PLT 296 02/10/2020     Chemistry      Component Value Date/Time   NA 140 02/10/2020 0800   K 4.0 02/10/2020 0800   CL 108 02/10/2020 0800   CO2 26 02/10/2020 0800   BUN 11 02/10/2020 0800   CREATININE 0.69 02/10/2020 0800   CREATININE 0.66 09/12/2017 0747      Component Value Date/Time   CALCIUM 9.9 02/10/2020 0800   ALKPHOS 93 02/10/2020 0800   AST 26 02/10/2020 0800   ALT 22 02/10/2020 0800   BILITOT 0.5 02/10/2020 0800      Impression and Plan: Ms. Birden is is a very nice 59 year old postmenopausal female.  She is originally from [place].  She moved down to [facility].  She has 2 grandchildren already.  Her children live on the [place].  She has early stage breast cancer.  Unfortunately, it is HER-2 positive.  Because this, she required chemotherapy with Herceptin.  I forgot to mention that she had a echocardiogram done back in August.  Her ejection fraction was 60-65%.  I think she sees the cardiologist next month.  I think the one issue is that she really needs to get on aromatase inhibitor therapy.  However, she is quite worried about this.  She is worried about it causing problems for her with respect to hot flashes.  I told her that I would not think that this would be a problem.  However, she is just very reluctant to start anything right now.  Seems like she just had a very tough time with the Taxol.  I told her that it probably would be a year before she would start  feeling better.  I probably  would check a vitamin B12 level on her.  She has the vitiligo.  She has the tinnitus.  Maybe she has low B12 levels.  I am  not sure she actually needs a year of Herceptin.  Given that she has early stage breast cancer, we might be able to get away with 9 months of Herceptin.  We will plan to see her back when she has her next cycle of Herceptin in December.  We will go ahead and get a bone scan on her.  She is complaining of back pain.  Again I think this is more inflammatory than anything else.  She is scheduled for a bone density test already.   [name], MD 11/17/20215:20 PM

## 2020-02-11 NOTE — Progress Notes (Signed)
Received a phone call from patient. She wishes to transfer care to Dr Marin Olp. She is currently schedule at [facility] on 12/8. I told her we would take care of her transition to [facility].  Notified Dr Marin Olp. He wants a bone scan and bone density done. He will place the orders today. He will also send message to scheduler to have her come back at the start of her next cycle around 03/03/2020.  Notified Bary Castilla, [facility] Nurse Navigator of patient wishes. Also contacted Candice in research [phone]) and notified her of patient decision.   Will follow up with appointment review at the end of the week to ensure that appointments and follow up have been scheduled as needed.   Oncology Nurse Navigator Documentation  Oncology Nurse Navigator Flowsheets 02/11/2020  Abnormal Finding Date -  Confirmed Diagnosis Date -  Diagnosis Status -  Planned Course of Treatment -  Phase of Treatment -  Chemotherapy Actual Start Date: -  Chemotherapy Actual End Date: -  Targeted Therapy Actual Start Date: -  Surgery Actual Start Date: -  Navigator Follow Up Date: 02/13/2020  Navigator Follow Up Reason: Appointment Review  Navigator Location [facility]  Referral Date to RadOnc/MedOnc -  Navigator Encounter Type Telephone  Telephone Incoming Call;Patient Update  Multidisiplinary Clinic Date -  Multidisiplinary Clinic Type -  Treatment Initiated Date -  Patient Visit Type MedOnc  Treatment Phase Active Tx  Barriers/Navigation Needs Coordination of Care;Education  Education Other  Interventions Coordination of Care;Education;Psycho-Social Support  Acuity Level 3-Moderate Needs (3-4 Barriers Identified)  Referrals -  Coordination of Care Appts;Other  Education Method Verbal  Support Groups/Services Friends and Family  Time Spent with Patient 24  Genetic Counseling Type -  Genetic Counseling Date -  Plastic Surgery Consult Date -

## 2020-02-11 NOTE — Progress Notes (Signed)
Initial RN Navigator Patient Visit  Name: Stephanie Chase Date of Referral : 02/04/20 Self Referral Diagnosis: Malignant neoplasm of upper-outer quadrant of right breast in female, estrogen receptor positive   Met with patient prior to their visit with MD. Introduced myself and provided patient with all my contact information. Gave her a tour of the office. Answered questions regarding this office to her satisfaction. She is aware that to continue on her research trial she will need to continue follow up at [facility]. She will let me know after seeing Dr Marin Olp whether or not she wants to transfer care to this location.   Patient completed visit with Dr. Marin Olp  Patient understands all follow up procedures and expectations. They have my number to reach out for any further clarification or additional needs.

## 2020-02-11 NOTE — Telephone Encounter (Signed)
No 02/11/20 LOS.... AOM

## 2020-02-12 ENCOUNTER — Encounter: Payer: Self-pay | Admitting: *Deleted

## 2020-02-12 ENCOUNTER — Telehealth: Payer: Self-pay

## 2020-02-12 NOTE — Telephone Encounter (Signed)
Pt aware of her 03/24/20 appts per 02/12/20 inbasket message   AOM

## 2020-02-12 NOTE — Progress Notes (Signed)
Cancelled patient's appointments at [facility]. Rescheduled her next treatment with [place] for 03/02/20. Per Dr Marin Olp he doesn't need to see her at her next cycle. Message sent to schedulers to schedule treatment and MD visit for cycle three on 12/29. Called and left message on patient's voicemail.  Oncology Nurse Navigator Documentation  Oncology Nurse Navigator Flowsheets 02/12/2020  Abnormal Finding Date -  Confirmed Diagnosis Date -  Diagnosis Status -  Planned Course of Treatment -  Phase of Treatment -  Chemotherapy Actual Start Date: -  Chemotherapy Actual End Date: -  Targeted Therapy Actual Start Date: -  Surgery Actual Start Date: -  Navigator Follow Up Date: 02/18/2020  Navigator Follow Up Reason: Appointment Review  Navigator Location [facility]  Referral Date to RadOnc/MedOnc -  Navigator Encounter Type Appt/Treatment Plan Review;Telephone  Telephone Outgoing Call  [facility] Date -  Multidisiplinary Clinic Type -  Treatment Initiated Date -  Patient Visit Type MedOnc  Treatment Phase Active Tx  Barriers/Navigation Needs -  Education Other  Interventions Coordination of Care;Education;Psycho-Social Support  Acuity Level 3-Moderate Needs (3-4 Barriers Identified)  Referrals -  Coordination of Care Appts  Education Method Verbal  Support Groups/Services Friends and Family  Time Spent with Patient 30  Genetic Counseling Type -  Genetic Counseling Date -  Plastic Surgery Consult Date -

## 2020-02-13 ENCOUNTER — Telehealth: Payer: Self-pay | Admitting: Radiology

## 2020-02-13 ENCOUNTER — Encounter: Payer: Self-pay | Admitting: *Deleted

## 2020-02-13 NOTE — Telephone Encounter (Signed)
[id number]: NEUROPATHY  02/13/2020    11:30AM  PHONE CALL: Confirmed I was speaking with Stephanie Chase. Informed patient of options for the S1714 study---she would be able to continue with assessments occurring here while receiving questionnaires via e-mail, do only questionnaires via e-mail, or withdraw from study. [name] expressed interest in receiving questionnaires via e-mail and this clinical research coordinator will touch base with patient early next year to see if she would be interested in coming in person for the neuropathy assessments. I thanked patient for her time and I appreciate her willingness to continue participating in this research study. Look forward to speaking with her in the near future.  Carol Ada, RT(R)(T) Clinical Research Coordinator

## 2020-02-16 ENCOUNTER — Telehealth: Payer: Self-pay

## 2020-02-16 NOTE — Telephone Encounter (Signed)
Called pt and she is aware of her bone scan at [facility].... AOM

## 2020-02-17 ENCOUNTER — Other Ambulatory Visit: Payer: Self-pay | Admitting: Nurse Practitioner

## 2020-02-18 NOTE — Telephone Encounter (Signed)
Lacie, For your approval or refusal. Leopoldo Mazzie M Daundre Biel, RN  

## 2020-02-22 NOTE — Progress Notes (Signed)
CARDIO-ONCOLOGY CLINIC NOTE  Referring Physician: Dr. Burr Medico Primary Care: Delsa Bern, MD  Primary Cardiologist: New  HPI:  Stephanie Chase is a 58 y.o. female with COPD with ongoing tobacco use, discoid lupus, hypothyroidism, hyperlipidemia, right breast cancer referred by Dr. Burr Medico for enrollment into the Cardio-Oncology program.  She was diagnosed with R breast CA in 06/2019. StageIA, pT1cN0M0,ER+/HER2+, PR-,Grade III. 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 Her margins were clear and she was node negative. Her left breast tissue was benign. Given her HER2 positive high grade disease she was started on weeklypaclitaxelandtrastuzumabfor 12 weeks on 10/28/19, followed bymaintenancetrastuzumabq3weeks   She has been struggling with R breast wound post-op and saw Dr. Marla Roe and is almost completely healed.   Denies any history of known heart disease. Had a stress test years ago (unsure why) which was OK. Has been strugglingwith left arm pain and numbness. Still smoking 1/2 ppd. Was sob recently but now better.    Echo today 02/26/20: EF 60-65% Grade 1DD Personally reviewed   Echo 8/21 EF 60-65% Personally reviewed  Echo: 5/21 EF 65-70% GLS -20.4%   Reviewed personally.    Current Outpatient Medications  Medication Sig Dispense Refill  . acetaminophen (TYLENOL) 500 MG tablet Take 500-1,000 mg by mouth every 6 (six) hours as needed for moderate pain.     Marland Kitchen albuterol (VENTOLIN HFA) 108 (90 Base) MCG/ACT inhaler Inhale 2 puffs into the lungs every 6 (six) hours as needed for wheezing or shortness of breath. 8 g 2  . ALPRAZolam (XANAX) 0.25 MG tablet Take 1 tablet (0.25 mg total) by mouth daily as needed for anxiety. 30 tablet 0  . Ascorbic Acid (VITAMIN C) 1000 MG tablet Take 1,000 mg by  mouth daily. (Patient not taking: Reported on 02/11/2020)    . baclofen (LIORESAL) 10 MG tablet Take 1 tablet (10 mg total) by mouth 2 (two) times daily as needed for muscle spasms. 30 each 1  . cetirizine (ZYRTEC) 10 MG tablet Take 10 mg by mouth daily as needed for allergies.  (Patient not taking: Reported on 02/11/2020)    . Cholecalciferol (VITAMIN D) 50 MCG (2000 UT) tablet Take 2,000 Units by mouth daily.    Marland Kitchen Dexlansoprazole (DEXILANT) 30 MG capsule Take 1 capsule (30 mg total) by mouth daily. (Patient not taking: Reported on 02/11/2020) 30 capsule 1  . dextromethorphan (DELSYM) 30 MG/5ML liquid Take 60 mg by mouth 2 (two) times daily as needed for cough.  (Patient not taking: Reported on 02/11/2020)    . erythromycin ophthalmic ointment PLACE 1 APPILCATION INTO LEFT EYE 3 TIMES A DAY FOR 5 DAYS    . EYSUVIS 0.25 % SUSP Instill 1 drop into both eyes   qid x 1 week, bid x 1 week    . fluticasone (CUTIVATE) 0.05 % cream     . fluticasone (FLONASE) 50 MCG/ACT nasal spray Place 2 sprays into both nostrils in the morning and at bedtime. 16 g 5  . fluticasone (FLOVENT HFA) 110 MCG/ACT inhaler Inhale 2 puffs into the lungs in the morning and at bedtime. 1 each 12  . levothyroxine (SYNTHROID) 75 MCG tablet Take 75 mcg by mouth daily before breakfast.    . lidocaine-prilocaine (EMLA) cream Apply 1 application topically as needed. (Patient taking differently: Apply 1 application topically daily as needed (port access). )  30 g 1  . liothyronine (CYTOMEL) 5 MCG tablet TAKE 2 TABLETS (=10MCG     TOTAL)     DAILY (Patient taking differently: Take 10 mcg by mouth daily. ) 180 tablet 1  . MAXITROL 3.5-10000-0.1 OINT Apply 1 a thin layer on eyelid twice a day    . Multiple Vitamin (MULTIVITAMIN WITH MINERALS) TABS tablet Take 1 tablet by mouth 2 (two) times daily.  (Patient not taking: Reported on 02/11/2020)    . nicotine (NICODERM CQ - DOSED IN MG/24 HOURS) 21 mg/24hr patch Place 21 mg onto the skin daily.  (Patient not taking: Reported on 02/11/2020)    . nystatin (MYCOSTATIN) 100000 UNIT/ML suspension Take 5 mLs (500,000 Units total) by mouth 4 (four) times daily. (Patient not taking: Reported on 02/11/2020) 473 mL 1  . ondansetron (ZOFRAN) 8 MG tablet Take 1 tablet (8 mg total) by mouth 2 (two) times daily as needed (Nausea or vomiting). (Patient not taking: Reported on 02/10/2020) 30 tablet 1  . PRED FORTE 1 % ophthalmic suspension SMARTSIG:In Eye(s)    . prochlorperazine (COMPAZINE) 10 MG tablet Take 1 tablet (10 mg total) by mouth every 6 (six) hours as needed (Nausea or vomiting). (Patient not taking: Reported on 02/10/2020) 30 tablet 1   No current facility-administered medications for this encounter.   Facility-Administered Medications Ordered in Other Encounters  Medication Dose Route Frequency Provider Last Rate Last Admin  . alum & mag hydroxide-simeth (MAALOX/MYLANTA) 200-200-20 MG/5ML suspension 30 mL  30 mL Oral Once Harle Stanford., PA-C        Allergies  Allergen Reactions  . Bactrim [Sulfamethoxazole-Trimethoprim] Itching and Rash      Vitals:   02/26/20 1024  BP: 138/80  Pulse: 82  SpO2: 96%  Weight: 77.5 kg (170 lb 12.8 oz)    PHYSICAL EXAM: General:  Well appearing. No resp difficulty HEENT: normal Neck: supple. no JVD. Carotids 2+ bilat; no bruits. No lymphadenopathy or thryomegaly appreciated. Cor: PMI nondisplaced. Regular rate & rhythm. No rubs, gallops or murmurs. Lungs: clear Abdomen: soft, nontender, nondistended. No hepatosplenomegaly. No bruits or masses. Good bowel sounds. Extremities: no cyanosis, clubbing, rash, edema Neuro: alert & orientedx3, cranial nerves grossly intact. moves all 4 extremities w/o difficulty. Affect pleasant   ASSESSMENT & PLAN:  1. Right Breast Cancer - She was diagnosed with R breast CA in 06/2019. StageIA, pT1cN0M0,ER+/HER2+, PR-,Grade III.  - Echo 5/21 EF 65-70% GLS -20.4% - Echo 11/24/19 EF 60-65% - Echo today  02/26/20: EF 60-65% Grade 1DD Personally reviewed -I reviewed echos personally. EF and Doppler parameters stable. No HF on exam. Continue Herceptin. Repeat echo 3 months   2. Tobacco use - smokes 0.5 ppd - Encouraged to quit.But not ready to quit yet with all the stress  3. Hyperlipidemia - LDL 168 in 6/19. - followed by PCP - We wil recheck today  - Will need further CV risk stratification after she gets through the first part of her chemo  Glori Bickers, MD  12:26 AM

## 2020-02-23 ENCOUNTER — Encounter (HOSPITAL_COMMUNITY)
Admission: RE | Admit: 2020-02-23 | Discharge: 2020-02-23 | Disposition: A | Discharge status: Home / Self Care | Payer: BC Managed Care – PPO | Source: Ambulatory Visit | Attending: Hematology & Oncology | Admitting: Hematology & Oncology

## 2020-02-23 ENCOUNTER — Other Ambulatory Visit: Payer: Self-pay

## 2020-02-23 ENCOUNTER — Ambulatory Visit (HOSPITAL_COMMUNITY)
Admission: RE | Admit: 2020-02-23 | Discharge: 2020-02-23 | Disposition: A | Discharge status: Home / Self Care | Payer: BC Managed Care – PPO | Source: Ambulatory Visit | Attending: Hematology & Oncology | Admitting: Hematology & Oncology

## 2020-02-23 DIAGNOSIS — Z170 Estrogen receptor positive status [ER+]: Secondary | ICD-10-CM | POA: Diagnosis present

## 2020-02-23 DIAGNOSIS — C50411 Malignant neoplasm of upper-outer quadrant of right female breast: Secondary | ICD-10-CM | POA: Diagnosis not present

## 2020-02-23 IMAGING — NM NM BONE WHOLE BODY
4 series · 4 of 4 positions shown · non-contrast
Comparison: None

Radiographic correlation: Chest radiograph [DATE]

CLINICAL DATA: Breast cancer, staging, mid to lower back pain

EXAM:
NUCLEAR MEDICINE WHOLE BODY BONE SCAN
TECHNIQUE: Whole body anterior and posterior images were obtained approximately
3 hours after intravenous injection of radiopharmaceutical.
RADIOPHARMACEUTICALS:  20.2 mCi [ZW] MDP IV

[Series 1: whole body · 2.66mm/px · 1 of 1 slices shown (1 of 2)]
[im 1/1]
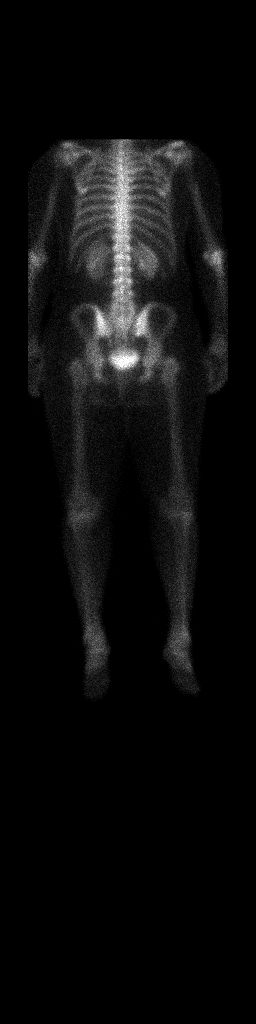

[Series 1: whole body · 2.66mm/px · 1 of 1 slices shown (2 of 2)]
[im 1/1]
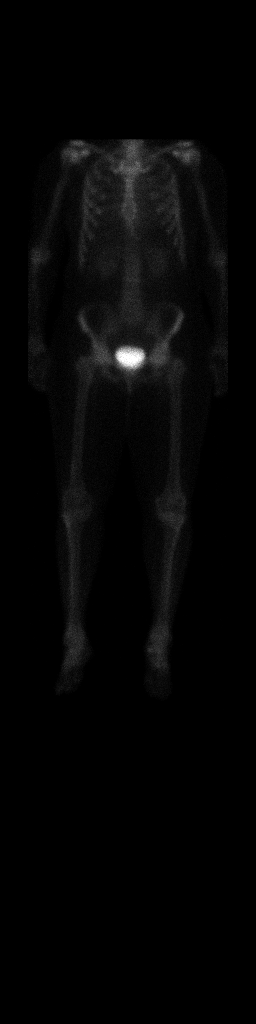

[Series 2: bone statics · 2.07mm/px · 1 of 1 slices shown (1 of 2)]
[im 1/1  full-range]
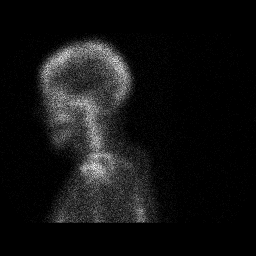

[Series 2: bone statics · 2.07mm/px · 1 of 1 slices shown (2 of 2)]
[im 1/1  full-range]
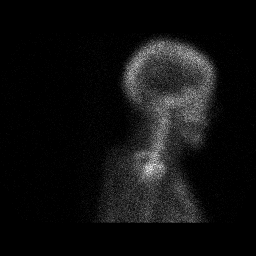

[4 of 4 positions shown; findings below may reference images not displayed]

FINDINGS: Patient unable tolerate head under camera for anterior and posterior
calvarial imaging.

Lateral views of the head and cervical spine were obtained.

Questionable abnormal increased tracer accumulation at the posterior
RIGHT fourth rib.

Uptake at the hips, elbows, shoulders, and sternoclavicular joints,
typically degenerative.

No additional sites of abnormal tracer uptake are identified.
IMPRESSION: Questionable abnormal tracer uptake at the posterior RIGHT fourth
rib, cannot exclude osseous metastasis; no definite abnormality is
seen at this site on a prior radiograph of [DATE].

## 2020-02-23 MED ORDER — TECHNETIUM TC 99M MEDRONATE IV KIT
20.2000 | PACK | Freq: Once | INTRAVENOUS | Status: AC | PRN
  Administered 2020-02-23: 20.2 via INTRAVENOUS

## 2020-02-24 ENCOUNTER — Encounter: Payer: Self-pay | Admitting: Hematology & Oncology

## 2020-02-24 NOTE — Progress Notes (Signed)
[place] [facility] Counseling Note   Patient started session saying she wasn't doing so well, mainly because both she and her mother had scans with scary results. Her mother's cancer has spread, while her scan she is unsure how to interpret. Patient said she has not 'had time to deal with her emotions.' Counselor elicited coping strategies and identified what patient could do today to help her. Patient described her switch in oncologists and how that has impacted her. Patient said she had a loan closing to get to and ended call at 9:16. Patient was in a sad mood, with a congruent affect. She was oriented times three and showed no HI/SI/NSSI. Patient seems uncomfortable talking about deeper topics, such as emotions, and tends to veer toward typical 'complaining,' or avoid emotions in general. Thinking she would be unable to regain her composure brings up fear, which results in the patient not discussing emotions or emotional topics. Next session will check in and potentially challenge patient.     Gaylyn Rong Counseling Intern

## 2020-02-24 NOTE — Telephone Encounter (Signed)
Returned call to pt's my chart message regarding recent bone scan. Discussed with pt per MD, " there is an area of concern on the ribs which will be monitored. Any new symptoms to call our office." Pt had concern about "a pinch in her neck numbness at times in her arm. this comes and goes." Pt advised she is under a great deal of stress with " everything that's going on" Asked pt if she has muscle tension regularly, possible slept wrong, try stretching muscles applying heat to area. Pt verbalized understanding, no further concerns at this time.

## 2020-02-26 ENCOUNTER — Other Ambulatory Visit: Payer: Self-pay

## 2020-02-26 ENCOUNTER — Ambulatory Visit (HOSPITAL_COMMUNITY)
Admission: RE | Admit: 2020-02-26 | Discharge: 2020-02-26 | Disposition: A | Discharge status: Home / Self Care | Payer: BC Managed Care – PPO | Source: Ambulatory Visit | Attending: Internal Medicine | Admitting: Internal Medicine

## 2020-02-26 ENCOUNTER — Encounter (HOSPITAL_COMMUNITY): Payer: Self-pay | Admitting: Internal Medicine

## 2020-02-26 ENCOUNTER — Ambulatory Visit (HOSPITAL_BASED_OUTPATIENT_CLINIC_OR_DEPARTMENT_OTHER)
Admission: RE | Admit: 2020-02-26 | Discharge: 2020-02-26 | Disposition: A | Discharge status: Home / Self Care | Payer: BC Managed Care – PPO | Source: Ambulatory Visit | Attending: Internal Medicine | Admitting: Internal Medicine

## 2020-02-26 VITALS — BP 138/80 | HR 82 | Wt 170.8 lb

## 2020-02-26 DIAGNOSIS — F17200 Nicotine dependence, unspecified, uncomplicated: Secondary | ICD-10-CM | POA: Insufficient documentation

## 2020-02-26 DIAGNOSIS — E785 Hyperlipidemia, unspecified: Secondary | ICD-10-CM | POA: Insufficient documentation

## 2020-02-26 DIAGNOSIS — C50411 Malignant neoplasm of upper-outer quadrant of right female breast: Secondary | ICD-10-CM | POA: Diagnosis not present

## 2020-02-26 DIAGNOSIS — Z0189 Encounter for other specified special examinations: Secondary | ICD-10-CM

## 2020-02-26 DIAGNOSIS — E782 Mixed hyperlipidemia: Secondary | ICD-10-CM

## 2020-02-26 DIAGNOSIS — J449 Chronic obstructive pulmonary disease, unspecified: Secondary | ICD-10-CM | POA: Diagnosis not present

## 2020-02-26 DIAGNOSIS — Z170 Estrogen receptor positive status [ER+]: Secondary | ICD-10-CM | POA: Diagnosis not present

## 2020-02-26 DIAGNOSIS — Z720 Tobacco use: Secondary | ICD-10-CM

## 2020-02-26 LAB — COMPREHENSIVE METABOLIC PANEL
ALT: 22 U/L (ref 0–44)
AST: 26 U/L (ref 15–41)
Albumin: 4 g/dL (ref 3.5–5.0)
Alkaline Phosphatase: 89 U/L (ref 38–126)
Anion gap: 9 (ref 5–15)
BUN: 9 mg/dL (ref 6–20)
CO2: 25 mmol/L (ref 22–32)
Calcium: 10.3 mg/dL (ref 8.9–10.3)
Chloride: 104 mmol/L (ref 98–111)
Creatinine, Ser: 0.69 mg/dL (ref 0.44–1.00)
GFR, Estimated: >60 mL/min (ref >60)
Glucose, Bld: 82 mg/dL (ref 70–99)
Potassium: 4.1 mmol/L (ref 3.5–5.1)
Sodium: 138 mmol/L (ref 135–145)
Total Bilirubin: 0.6 mg/dL (ref 0.3–1.2)
Total Protein: 7.2 g/dL (ref 6.5–8.1)

## 2020-02-26 LAB — LIPID PANEL
Cholesterol: 231 mg/dL — ABNORMAL HIGH (ref 0–200)
HDL: 54 mg/dL (ref >40)
LDL Cholesterol: 142 mg/dL — ABNORMAL HIGH (ref 0–99)
Total CHOL/HDL Ratio: 4.3 RATIO
Triglycerides: 175 mg/dL — ABNORMAL HIGH (ref <150)
VLDL: 35 mg/dL (ref 0–40)

## 2020-02-26 LAB — ECHOCARDIOGRAM COMPLETE
Area-P 1/2: 2.76 cm2
S' Lateral: 2.6 cm

## 2020-02-26 NOTE — Addendum Note (Signed)
Encounter addended by: Scarlette Calico, RN on: 02/26/2020 11:06 AM  Actions taken: Order list changed, Diagnosis association updated, Clinical Note Signed, Charge Capture section accepted

## 2020-02-26 NOTE — Patient Instructions (Signed)
Labs done today, your results will be available in MyChart, we will contact you for abnormal readings.  Your physician recommends that you schedule a follow-up appointment in:  3 months  If you have any questions or concerns before your next appointment please send us a message through mychart or call our office at [phone].    TO LEAVE A MESSAGE FOR THE NURSE SELECT OPTION 2, PLEASE LEAVE A MESSAGE INCLUDING: . YOUR NAME . DATE OF BIRTH . CALL BACK NUMBER . REASON FOR CALL**this is important as we prioritize the call backs  YOU WILL RECEIVE A CALL BACK THE SAME DAY AS LONG AS YOU CALL BEFORE 4:00 PM  At the [facility], you and your health needs are our priority. As part of our continuing mission to provide you with exceptional heart care, we have created designated Provider Care Teams. These Care Teams include your primary Cardiologist (physician) and Advanced Practice Providers (APPs- Physician Assistants and Nurse Practitioners) who all work together to provide you with the care you need, when you need it.   You may see any of the following providers on your designated Care Team at your next follow up: . Dr Daniel Bensimhon . Dr Dalton McLean . Amy Clegg, NP . Brittainy Simmons, PA . Lauren Kemp, PharmD   Please be sure to bring in all your medications bottles to every appointment.     

## 2020-03-02 ENCOUNTER — Inpatient Hospital Stay: Payer: BC Managed Care – PPO | Attending: Hematology

## 2020-03-02 ENCOUNTER — Inpatient Hospital Stay: Payer: BC Managed Care – PPO

## 2020-03-02 ENCOUNTER — Other Ambulatory Visit: Payer: Self-pay

## 2020-03-02 ENCOUNTER — Ambulatory Visit (HOSPITAL_BASED_OUTPATIENT_CLINIC_OR_DEPARTMENT_OTHER)
Admission: RE | Admit: 2020-03-02 | Discharge: 2020-03-02 | Disposition: A | Discharge status: Home / Self Care | Payer: BC Managed Care – PPO | Source: Ambulatory Visit | Attending: Nurse Practitioner | Admitting: Nurse Practitioner

## 2020-03-02 DIAGNOSIS — M810 Age-related osteoporosis without current pathological fracture: Secondary | ICD-10-CM | POA: Diagnosis not present

## 2020-03-02 DIAGNOSIS — E538 Deficiency of other specified B group vitamins: Secondary | ICD-10-CM

## 2020-03-02 DIAGNOSIS — Z5112 Encounter for antineoplastic immunotherapy: Secondary | ICD-10-CM | POA: Insufficient documentation

## 2020-03-02 DIAGNOSIS — Z170 Estrogen receptor positive status [ER+]: Secondary | ICD-10-CM | POA: Insufficient documentation

## 2020-03-02 DIAGNOSIS — C50411 Malignant neoplasm of upper-outer quadrant of right female breast: Secondary | ICD-10-CM | POA: Diagnosis present

## 2020-03-02 DIAGNOSIS — E559 Vitamin D deficiency, unspecified: Secondary | ICD-10-CM

## 2020-03-02 LAB — CMP (CANCER CENTER ONLY)
ALT: 15 U/L (ref 0–44)
AST: 20 U/L (ref 15–41)
Albumin: 4.3 g/dL (ref 3.5–5.0)
Alkaline Phosphatase: 87 U/L (ref 38–126)
Anion gap: 7 (ref 5–15)
BUN: 12 mg/dL (ref 6–20)
CO2: 26 mmol/L (ref 22–32)
Calcium: 10.5 mg/dL — ABNORMAL HIGH (ref 8.9–10.3)
Chloride: 104 mmol/L (ref 98–111)
Creatinine: 0.68 mg/dL (ref 0.44–1.00)
GFR, Estimated: >60 mL/min (ref >60)
Glucose, Bld: 88 mg/dL (ref 70–99)
Potassium: 3.9 mmol/L (ref 3.5–5.1)
Sodium: 137 mmol/L (ref 135–145)
Total Bilirubin: 0.3 mg/dL (ref 0.3–1.2)
Total Protein: 7.1 g/dL (ref 6.5–8.1)

## 2020-03-02 LAB — CBC WITH DIFFERENTIAL (CANCER CENTER ONLY)
Abs Immature Granulocytes: 0.02 10*3/uL (ref 0.00–0.07)
Basophils Absolute: 0.1 10*3/uL (ref 0.0–0.1)
Basophils Relative: 1 %
Eosinophils Absolute: 0 10*3/uL (ref 0.0–0.5)
Eosinophils Relative: 1 %
HCT: 39.8 % (ref 36.0–46.0)
Hemoglobin: 13.3 g/dL (ref 12.0–15.0)
Immature Granulocytes: 0 %
Lymphocytes Relative: 24 %
Lymphs Abs: 2.1 10*3/uL (ref 0.7–4.0)
MCH: 29.8 pg (ref 26.0–34.0)
MCHC: 33.4 g/dL (ref 30.0–36.0)
MCV: 89.2 fL (ref 80.0–100.0)
Monocytes Absolute: 0.7 10*3/uL (ref 0.1–1.0)
Monocytes Relative: 7 %
Neutro Abs: 5.9 10*3/uL (ref 1.7–7.7)
Neutrophils Relative %: 67 %
Platelet Count: 284 10*3/uL (ref 150–400)
RBC: 4.46 MIL/uL (ref 3.87–5.11)
RDW: 13.2 % (ref 11.5–15.5)
WBC Count: 8.7 10*3/uL (ref 4.0–10.5)
nRBC: 0 % (ref 0.0–0.2)

## 2020-03-02 LAB — VITAMIN D 25 HYDROXY (VIT D DEFICIENCY, FRACTURES): Vit D, 25-Hydroxy: 26.14 ng/mL — ABNORMAL LOW (ref 30–100)

## 2020-03-02 LAB — LACTATE DEHYDROGENASE: LDH: 176 U/L (ref 98–192)

## 2020-03-02 LAB — VITAMIN B12: Vitamin B-12: 410 pg/mL (ref 180–914)

## 2020-03-02 MED ORDER — SODIUM CHLORIDE 0.9 % IV SOLN
Freq: Once | INTRAVENOUS | Status: AC
  Filled 2020-03-02: qty 250

## 2020-03-02 MED ORDER — ACETAMINOPHEN 325 MG PO TABS
ORAL_TABLET | ORAL | Status: AC
  Filled 2020-03-02: qty 2

## 2020-03-02 MED ORDER — DIPHENHYDRAMINE HCL 25 MG PO CAPS
25.0000 mg | ORAL_CAPSULE | Freq: Once | ORAL | Status: AC
  Administered 2020-03-02: 25 mg via ORAL

## 2020-03-02 MED ORDER — TRASTUZUMAB-QYYP CHEMO 150 MG IV SOLR
450.0000 mg | Freq: Once | INTRAVENOUS | Status: AC
  Administered 2020-03-02: 450 mg via INTRAVENOUS
  Filled 2020-03-02: qty 21.43

## 2020-03-02 MED ORDER — DIPHENHYDRAMINE HCL 25 MG PO CAPS
ORAL_CAPSULE | ORAL | Status: AC
  Filled 2020-03-02: qty 1

## 2020-03-02 MED ORDER — HEPARIN SOD (PORK) LOCK FLUSH 100 UNIT/ML IV SOLN
500.0000 [IU] | Freq: Once | INTRAVENOUS | Status: AC | PRN
  Administered 2020-03-02: 500 [IU]
  Filled 2020-03-02: qty 5

## 2020-03-02 MED ORDER — ACETAMINOPHEN 325 MG PO TABS
650.0000 mg | ORAL_TABLET | Freq: Once | ORAL | Status: AC
  Administered 2020-03-02: 650 mg via ORAL

## 2020-03-02 MED ORDER — SODIUM CHLORIDE 0.9% FLUSH
10.0000 mL | INTRAVENOUS | Status: DC | PRN
  Administered 2020-03-02: 10 mL
  Filled 2020-03-02: qty 10

## 2020-03-02 NOTE — Patient Instructions (Signed)

## 2020-03-02 NOTE — Patient Instructions (Signed)
Pt discharged in no apparent distress. Pt left ambulatory without assistance. Pt aware of discharge instructions and verbalized understanding and had no further questions.  

## 2020-03-03 ENCOUNTER — Other Ambulatory Visit: Payer: BC Managed Care – PPO

## 2020-03-03 ENCOUNTER — Ambulatory Visit: Payer: BC Managed Care – PPO | Admitting: Hematology

## 2020-03-03 ENCOUNTER — Ambulatory Visit: Payer: BC Managed Care – PPO

## 2020-03-03 LAB — CANCER ANTIGEN 27.29: CA 27.29: 17 U/mL (ref 0.0–38.6)

## 2020-03-12 ENCOUNTER — Other Ambulatory Visit: Payer: Self-pay | Admitting: Pharmacist

## 2020-03-12 ENCOUNTER — Encounter: Payer: Self-pay | Admitting: *Deleted

## 2020-03-12 DIAGNOSIS — Z299 Encounter for prophylactic measures, unspecified: Secondary | ICD-10-CM

## 2020-03-12 DIAGNOSIS — C50411 Malignant neoplasm of upper-outer quadrant of right female breast: Secondary | ICD-10-CM

## 2020-03-12 DIAGNOSIS — Z170 Estrogen receptor positive status [ER+]: Secondary | ICD-10-CM

## 2020-03-12 DIAGNOSIS — M810 Age-related osteoporosis without current pathological fracture: Secondary | ICD-10-CM | POA: Insufficient documentation

## 2020-03-12 DIAGNOSIS — M818 Other osteoporosis without current pathological fracture: Secondary | ICD-10-CM

## 2020-03-12 MED ORDER — AMOXICILLIN 500 MG PO TABS: 2000.0000 mg | ORAL_TABLET | Freq: Once | ORAL | 0 refills | Status: AC

## 2020-03-12 NOTE — Progress Notes (Signed)
Patient called stating he needs to have a tooth removed due to bone loss in her jaw. She wants to know if she needs to do anything additional prior to the extraction. She also asks if the bone loss, also seen in her recent scans, can be attributed to her treatment.   Spoke with Dr Marin Olp.  He would like patient to take Amoxicillin 2 grams one hour prior to extraction. He would also like to start the patient on Prolia once she recovers from the tooth extraction. He also states that the bone loss may be due to her treatment.   Reviewed Dr Antonieta Pert response with the patient. She is aware of new prescription, pharmacy confirmed.   Oncology Nurse Navigator Documentation  Oncology Nurse Navigator Flowsheets 03/12/2020  Abnormal Finding Date -  Confirmed Diagnosis Date -  Diagnosis Status -  Planned Course of Treatment -  Phase of Treatment -  Chemotherapy Actual Start Date: -  Chemotherapy Actual End Date: -  Targeted Therapy Actual Start Date: -  Surgery Actual Start Date: -  Navigator Follow Up Date: -  Navigator Follow Up Reason: -  Navigation Complete Date: -  Post Navigation: Continue to Follow Patient? 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  Referral Date to RadOnc/MedOnc -  Navigator Encounter Type Telephone  Telephone Incoming Call  [facility] Date -  Multidisiplinary Clinic Type -  Treatment Initiated Date -  Patient Visit Type MedOnc  Treatment Phase Active Tx  Barriers/Navigation Needs Coordination of Care;Education  Education Other  Interventions Coordination of Care;Education;Medication Assistance;Psycho-Social Support  Acuity Level 3-Moderate Needs (3-4 Barriers Identified)  Referrals -  Coordination of Care Other  Education Method Verbal  Support Groups/Services Friends and Family  Time Spent with Patient 57  Genetic Counseling Type -  Genetic Counseling Date -  Plastic Surgery Consult Date -

## 2020-03-15 ENCOUNTER — Encounter: Payer: Self-pay | Admitting: General Practice

## 2020-03-15 NOTE — Progress Notes (Signed)
[facility] Spiritual Care Note  Referred by Counseling Intern Gaylyn Rong for emotional support during Emily's extended winter break. Reached Ms Najarian by phone, providing empathic listening, emotional support, affirmation of strengths, and brainstorming about meaning-making opportunities in the midst of limitations (back pain, covid, cold weather, etc). Will add Ms Metsker to [facility] mailing list per her request and email her January's offerings to help bridge isolation and increase meaning-making activity. She has my direct dial number, and I also plan to follow up by phone in ca 1 week.  [place], [place], [facility] Pager [phone] Voicemail [phone]

## 2020-03-16 ENCOUNTER — Other Ambulatory Visit: Payer: Self-pay | Admitting: Hematology

## 2020-03-16 DIAGNOSIS — R0600 Dyspnea, unspecified: Secondary | ICD-10-CM

## 2020-03-22 ENCOUNTER — Encounter: Payer: Self-pay | Admitting: General Practice

## 2020-03-22 NOTE — Progress Notes (Signed)
[facility] Note  Reached Zianne by phone at an inconvenient time. We plan to try again this afternoon.   [place], [place], [facility] Pager [phone] Voicemail [phone]

## 2020-03-22 NOTE — Progress Notes (Signed)
[facility] Spiritual Care Note  Left voicemail, encouraging return call.   Chaplain Shyah Cadmus, MDiv, [facility] Pager [phone] Voicemail [phone]  

## 2020-03-24 ENCOUNTER — Encounter: Payer: Self-pay | Admitting: Hematology & Oncology

## 2020-03-24 ENCOUNTER — Other Ambulatory Visit: Payer: Self-pay

## 2020-03-24 ENCOUNTER — Inpatient Hospital Stay (HOSPITAL_BASED_OUTPATIENT_CLINIC_OR_DEPARTMENT_OTHER): Payer: BC Managed Care – PPO | Admitting: Hematology & Oncology

## 2020-03-24 ENCOUNTER — Inpatient Hospital Stay: Payer: BC Managed Care – PPO

## 2020-03-24 VITALS — BP 128/82 | HR 84 | Temp 98.0°F | Resp 20 | Wt 169.0 lb

## 2020-03-24 DIAGNOSIS — Z170 Estrogen receptor positive status [ER+]: Secondary | ICD-10-CM

## 2020-03-24 DIAGNOSIS — C50411 Malignant neoplasm of upper-outer quadrant of right female breast: Secondary | ICD-10-CM | POA: Diagnosis not present

## 2020-03-24 DIAGNOSIS — Z5112 Encounter for antineoplastic immunotherapy: Secondary | ICD-10-CM | POA: Diagnosis not present

## 2020-03-24 LAB — CBC WITH DIFFERENTIAL (CANCER CENTER ONLY)
Abs Immature Granulocytes: 0.02 10*3/uL (ref 0.00–0.07)
Basophils Absolute: 0 10*3/uL (ref 0.0–0.1)
Basophils Relative: 0 %
Eosinophils Absolute: 0 10*3/uL (ref 0.0–0.5)
Eosinophils Relative: 0 %
HCT: 40.2 % (ref 36.0–46.0)
Hemoglobin: 13.5 g/dL (ref 12.0–15.0)
Immature Granulocytes: 0 %
Lymphocytes Relative: 18 %
Lymphs Abs: 1.7 10*3/uL (ref 0.7–4.0)
MCH: 29.5 pg (ref 26.0–34.0)
MCHC: 33.6 g/dL (ref 30.0–36.0)
MCV: 88 fL (ref 80.0–100.0)
Monocytes Absolute: 0.7 10*3/uL (ref 0.1–1.0)
Monocytes Relative: 8 %
Neutro Abs: 6.7 10*3/uL (ref 1.7–7.7)
Neutrophils Relative %: 74 %
Platelet Count: 273 10*3/uL (ref 150–400)
RBC: 4.57 MIL/uL (ref 3.87–5.11)
RDW: 12.2 % (ref 11.5–15.5)
WBC Count: 9.1 10*3/uL (ref 4.0–10.5)
nRBC: 0 % (ref 0.0–0.2)

## 2020-03-24 LAB — CMP (CANCER CENTER ONLY)
ALT: 18 U/L (ref 0–44)
AST: 21 U/L (ref 15–41)
Albumin: 4.1 g/dL (ref 3.5–5.0)
Alkaline Phosphatase: 82 U/L (ref 38–126)
Anion gap: 7 (ref 5–15)
BUN: 13 mg/dL (ref 6–20)
CO2: 28 mmol/L (ref 22–32)
Calcium: 10.7 mg/dL — ABNORMAL HIGH (ref 8.9–10.3)
Chloride: 103 mmol/L (ref 98–111)
Creatinine: 0.65 mg/dL (ref 0.44–1.00)
GFR, Estimated: >60 mL/min (ref >60)
Glucose, Bld: 90 mg/dL (ref 70–99)
Potassium: 4 mmol/L (ref 3.5–5.1)
Sodium: 138 mmol/L (ref 135–145)
Total Bilirubin: 0.3 mg/dL (ref 0.3–1.2)
Total Protein: 7 g/dL (ref 6.5–8.1)

## 2020-03-24 LAB — LACTATE DEHYDROGENASE: LDH: 176 U/L (ref 98–192)

## 2020-03-24 MED ORDER — ACETAMINOPHEN 325 MG PO TABS
ORAL_TABLET | ORAL | Status: AC
  Filled 2020-03-24: qty 2

## 2020-03-24 MED ORDER — DIPHENHYDRAMINE HCL 25 MG PO CAPS
ORAL_CAPSULE | ORAL | Status: AC
  Filled 2020-03-24: qty 1

## 2020-03-24 MED ORDER — DIPHENHYDRAMINE HCL 25 MG PO CAPS
25.0000 mg | ORAL_CAPSULE | Freq: Once | ORAL | Status: AC
  Administered 2020-03-24: 25 mg via ORAL

## 2020-03-24 MED ORDER — SODIUM CHLORIDE 0.9% FLUSH
10.0000 mL | INTRAVENOUS | Status: DC | PRN
  Administered 2020-03-24: 10 mL
  Filled 2020-03-24: qty 10

## 2020-03-24 MED ORDER — TRASTUZUMAB-QYYP CHEMO 150 MG IV SOLR
450.0000 mg | Freq: Once | INTRAVENOUS | Status: AC
  Administered 2020-03-24: 450 mg via INTRAVENOUS
  Filled 2020-03-24: qty 21.43

## 2020-03-24 MED ORDER — HEPARIN SOD (PORK) LOCK FLUSH 100 UNIT/ML IV SOLN
500.0000 [IU] | Freq: Once | INTRAVENOUS | Status: AC | PRN
  Administered 2020-03-24: 500 [IU]
  Filled 2020-03-24: qty 5

## 2020-03-24 MED ORDER — ACETAMINOPHEN 325 MG PO TABS
650.0000 mg | ORAL_TABLET | Freq: Once | ORAL | Status: AC
  Administered 2020-03-24: 650 mg via ORAL

## 2020-03-24 MED ORDER — SODIUM CHLORIDE 0.9% FLUSH
3.0000 mL | INTRAVENOUS | Status: DC | PRN
  Filled 2020-03-24: qty 10

## 2020-03-24 MED ORDER — SODIUM CHLORIDE 0.9 % IV SOLN
Freq: Once | INTRAVENOUS | Status: AC
  Filled 2020-03-24: qty 250

## 2020-03-24 NOTE — Patient Instructions (Signed)
Trastuzumab injection for infusion What is this medicine? TRASTUZUMAB (tras TOO zoo mab) is a monoclonal antibody. It is used to treat breast cancer and stomach cancer. This medicine may be used for other purposes; ask your health care provider or pharmacist if you have questions. COMMON BRAND NAME(S): Herceptin, Herzuma, KANJINTI, Ogivri, Ontruzant, Trazimera What should I tell my health care provider before I take this medicine? They need to know if you have any of these conditions:  heart disease  heart failure  lung or breathing disease, like asthma  an unusual or allergic reaction to trastuzumab, benzyl alcohol, or other medications, foods, dyes, or preservatives  pregnant or trying to get pregnant  breast-feeding How should I use this medicine? This drug is given as an infusion into a vein. It is administered in a hospital or clinic by a specially trained health care professional. Talk to your pediatrician regarding the use of this medicine in children. This medicine is not approved for use in children. Overdosage: If you think you have taken too much of this medicine contact a poison control center or emergency room at once. NOTE: This medicine is only for you. Do not share this medicine with others. What if I miss a dose? It is important not to miss a dose. Call your doctor or health care professional if you are unable to keep an appointment. What may interact with this medicine? This medicine may interact with the following medications:  certain types of chemotherapy, such as daunorubicin, doxorubicin, epirubicin, and idarubicin This list may not describe all possible interactions. Give your health care provider a list of all the medicines, herbs, non-prescription drugs, or dietary supplements you use. Also tell them if you smoke, drink alcohol, or use illegal drugs. Some items may interact with your medicine. What should I watch for while using this medicine? Visit your  doctor for checks on your progress. Report any side effects. Continue your course of treatment even though you feel ill unless your doctor tells you to stop. Call your doctor or health care professional for advice if you get a fever, chills or sore throat, or other symptoms of a cold or flu. Do not treat yourself. Try to avoid being around people who are sick. You may experience fever, chills and shaking during your first infusion. These effects are usually mild and can be treated with other medicines. Report any side effects during the infusion to your health care professional. Fever and chills usually do not happen with later infusions. Do not become pregnant while taking this medicine or for 7 months after stopping it. Women should inform their doctor if they wish to become pregnant or think they might be pregnant. Women of child-bearing potential will need to have a negative pregnancy test before starting this medicine. There is a potential for serious side effects to an unborn child. Talk to your health care professional or pharmacist for more information. Do not breast-feed an infant while taking this medicine or for 7 months after stopping it. Women must use effective birth control with this medicine. What side effects may I notice from receiving this medicine? Side effects that you should report to your doctor or health care professional as soon as possible:  allergic reactions like skin rash, itching or hives, swelling of the face, lips, or tongue  chest pain or palpitations  cough  dizziness  feeling faint or lightheaded, falls  fever  general ill feeling or flu-like symptoms  signs of worsening heart failure like   breathing problems; swelling in your legs and feet  unusually weak or tired Side effects that usually do not require medical attention (report to your doctor or health care professional if they continue or are bothersome):  bone pain  changes in  taste  diarrhea  joint pain  nausea/vomiting  weight loss This list may not describe all possible side effects. Call your doctor for medical advice about side effects. You may report side effects to FDA at 1-800-FDA-1088. Where should I keep my medicine? This drug is given in a hospital or clinic and will not be stored at home. NOTE: This sheet is a summary. It may not cover all possible information. If you have questions about this medicine, talk to your doctor, pharmacist, or health care provider.  2020 Elsevier/Gold Standard (2016-03-07 14:37:52)  

## 2020-03-24 NOTE — Progress Notes (Signed)
Pt discharged in no apparent distress. Pt left ambulatory without assistance. Pt aware of discharge instructions and verbalized understanding and had no further questions. Stable and asymptomatic upon discharge.  

## 2020-03-24 NOTE — Progress Notes (Signed)
Hematology and Oncology Follow Up Visit  Stephanie Chase [id number] 01/05/1961 59 y.o. 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 6 months-first dose on 04/15/2020     Interim History:  Stephanie Chase is in the [place] for follow-up.  She comes in with her friend.  He is a real nice man.  She is still has a lot of her issues.  I does feel that she has some any problems.  Thankfully, her hair is coming back.  This gives her a little bit of relief.  She still has issues with respect to pain.  She has a lot of areas of pain.  We did do a bone scan on her.  There was a questionable area in her right fourth rib.  I told Stephanie Chase that I would just follow this with another bone scan.  I do not think we have to put her through an MRI.  She has the back discomfort.  She has hip discomfort.  She has had a little bit of a cough.  There is occasional shortness of breath.  She is had no chest wall pain.  There is been no rash.  She has had no bleeding.  There has been no obvious issues with bowels or bladder.  We did do a vitamin D level on her last him that we saw her.  It was a little bit on the low side.  I told her to take 2000 units daily.  We did do a bone density test on her.  She does have some osteoporosis.  I think she would benefit from Prolia at 60 mg subcu every 6 months.  When we last saw her, her CA 27.29 was only 17.  Her appetite seems to be doing okay.  She has had no vomiting.  There is some nausea.  Overall, I would say her performance status is ECOG 2.  Medications:  Current Outpatient Medications:  .  acetaminophen (TYLENOL) 500 MG tablet,  Take 500-1,000 mg by mouth every 6 (six) hours as needed for moderate pain. , Disp: , Rfl:  .  albuterol (VENTOLIN HFA) 108 (90 Base) MCG/ACT inhaler, Inhale 2 puffs into the lungs every 6 (six) hours as needed for wheezing or shortness of breath., Disp: 8 g, Rfl: 2 .  ALPRAZolam (XANAX) 0.25 MG tablet, Take 1 tablet (0.25 mg total) by mouth daily as needed for anxiety., Disp: 30 tablet, Rfl: 0 .  Ascorbic Acid (VITAMIN C) 1000 MG tablet, Take 1,000 mg by mouth daily. , Disp: , Rfl:  .  baclofen (LIORESAL) 10 MG tablet, Take 1 tablet (10 mg total) by mouth 2 (two) times daily as needed for muscle spasms., Disp: 30 each, Rfl: 1 .  Cholecalciferol (VITAMIN D) 50 MCG (2000 UT) tablet, Take 2,000 Units by mouth daily., Disp: , Rfl:  .  Dexlansoprazole (DEXILANT) 30 MG capsule, Take 1 capsule (30 mg total) by mouth daily., Disp: 30 capsule, Rfl: 1 .  EYSUVIS 0.25 % SUSP, Instill 1 drop into both eyes   qid x 1 week, bid x 1 week, Disp: , Rfl:  .  fluticasone (CUTIVATE) 0.05 % cream, , Disp: , Rfl:  .  fluticasone (  FLONASE) 50 MCG/ACT nasal spray, Place 2 sprays into both nostrils in the morning and at bedtime., Disp: 16 g, Rfl: 5 .  fluticasone (FLOVENT HFA) 110 MCG/ACT inhaler, Inhale 2 puffs into the lungs in the morning and at bedtime., Disp: 1 each, Rfl: 12 .  levothyroxine (SYNTHROID) 75 MCG tablet, Take 75 mcg by mouth daily before breakfast., Disp: , Rfl:  .  lidocaine-prilocaine (EMLA) cream, Apply 1 application topically as needed., Disp: 30 g, Rfl: 1 .  liothyronine (CYTOMEL) 5 MCG tablet, TAKE 2 TABLETS (=10MCG     TOTAL)     DAILY, Disp: 180 tablet, Rfl: 1 .  MAXITROL 3.5-10000-0.1 OINT, Apply 1 a thin layer on eyelid twice a day, Disp: , Rfl:  .  Multiple Vitamin (MULTIVITAMIN WITH MINERALS) TABS tablet, Take 1 tablet by mouth 2 (two) times daily. , Disp: , Rfl:  .  PRED FORTE 1 % ophthalmic suspension, SMARTSIG:In Eye(s), Disp: , Rfl:  No current facility-administered medications for this  visit.  Facility-Administered Medications Ordered in Other Visits:  .  alum & mag hydroxide-simeth (MAALOX/MYLANTA) 200-200-20 MG/5ML suspension 30 mL, 30 mL, Oral, Once, Tanner, Lyndon Code., PA-C  Allergies:  Allergies  Allergen Reactions  . Bactrim [Sulfamethoxazole-Trimethoprim] Itching and Rash    Past Medical History, Surgical history, Social history, and Family History were reviewed and updated.  Review of Systems: Review of Systems  Constitutional: Positive for fatigue.  HENT:   Positive for hearing loss and tinnitus.   Eyes: Positive for eye problems.  Respiratory: Positive for cough.   Cardiovascular: Negative.   Gastrointestinal: Positive for abdominal pain and nausea.  Endocrine: Negative.   Genitourinary: Negative.    Musculoskeletal: Positive for arthralgias and myalgias.  Skin: Negative.   Neurological: Positive for light-headedness.  Hematological: Negative.   Psychiatric/Behavioral: Negative.     Physical Exam:  vitals were not taken for this visit.   Wt Readings from Last 3 Encounters:  02/26/20 170 lb 12.8 oz (77.5 kg)  02/11/20 170 lb 12.8 oz (77.5 kg)  02/10/20 170 lb 11.2 oz (77.4 kg)    Physical Exam Vitals reviewed.  Constitutional:      Comments: She has had bilateral mastectomies.  Mastectomy scars are well-healed.  There is no erythema or warmth or swelling associated with these.  I cannot palpate any bilateral axillary lymph nodes.  HENT:     Head: Normocephalic and atraumatic.  Eyes:     Pupils: Pupils are equal, round, and reactive to light.  Cardiovascular:     Rate and Rhythm: Normal rate and regular rhythm.     Heart sounds: Normal heart sounds.  Pulmonary:     Effort: Pulmonary effort is normal.     Breath sounds: Normal breath sounds.  Abdominal:     General: Bowel sounds are normal.     Palpations: Abdomen is soft.  Musculoskeletal:        General: No tenderness or deformity. Normal range of motion.     Cervical back: Normal  range of motion.  Lymphadenopathy:     Cervical: No cervical adenopathy.  Skin:    General: Skin is warm and dry.     Findings: No erythema or rash.  Neurological:     Mental Status: She is alert and oriented to person, place, and time.  Psychiatric:        Behavior: Behavior normal.        Thought Content: Thought content normal.        Judgment: Judgment normal.  Lab Results  Component Value Date   WBC 8.7 03/02/2020   HGB 13.3 03/02/2020   HCT 39.8 03/02/2020   MCV 89.2 03/02/2020   PLT 284 03/02/2020     Chemistry      Component Value Date/Time   NA 137 03/02/2020 1044   K 3.9 03/02/2020 1044   CL 104 03/02/2020 1044   CO2 26 03/02/2020 1044   BUN 12 03/02/2020 1044   CREATININE 0.68 03/02/2020 1044   CREATININE 0.66 09/12/2017 0747      Component Value Date/Time   CALCIUM 10.5 (H) 03/02/2020 1044   ALKPHOS 87 03/02/2020 1044   AST 20 03/02/2020 1044   ALT 15 03/02/2020 1044   BILITOT 0.3 03/02/2020 1044      Impression and Plan: Stephanie Chase is is a very nice 36 year old postmenopausal female.  She is originally from [place].  She moved down to [place].  She has 2 grandchildren already.  Her children live on the [place].  She has early stage breast cancer.  Unfortunately, it is HER-2 positive.  Because this, she required chemotherapy with Herceptin.  I forgot to mention that she had a echocardiogram done back in August.  Her ejection fraction was 60-65%.  I think she sees the cardiologist next month.  I think the one issue is that she really needs to get on aromatase inhibitor therapy.  I realize that she does have a lot of issues right now.  I know she has a lot of bony issues.  Is hard to say if the aromatase inhibitor would bother this.  I think we got her on Prolia this may help.  I think her vitamin D level got little bit higher, this also would help.  She is going for her Herceptin today.  I forgot to mention that we did check a  vitamin B12 level on her last time she was here.  It was okay at 410.  I think this probably can take a good year or so for her to get over the side effects from the chemotherapy.  I know some patients just have a lengthy recovery time.  I know that she is doing her best.  She is trying to stay active.  She is lucky that she has a good support system.  We will plan for another follow-up in 3 weeks.    [name], MD 12/29/20218:11 AM

## 2020-03-25 ENCOUNTER — Telehealth: Payer: Self-pay

## 2020-03-25 NOTE — Telephone Encounter (Signed)
S/w pt per 03/24/20 los and she is aware of her appts    aom

## 2020-03-30 ENCOUNTER — Encounter: Payer: Self-pay | Admitting: Hematology & Oncology

## 2020-04-07 NOTE — Progress Notes (Signed)
[place] Patient and [facility] Counseling Note   Session began by checking in and assessing how pt is doing after break. Pt described feeling "overwhelmed" and "withdrawing" a lot. Patient discussed how she "can't do anything" like she used to, which often causes her to break down crying. The chemo-brain has resulted in pt forgetting how to do things and making small tasks into big projects. Counselor asked about previous coping skills and patient described all of her previous attempts and failures. Pt doesn't seem to be able to find joy in things she does. Patient also discussed her family and the struggles of having no family living in the state. Patient was oriented times three, and in a sad, overwhelmed mood. Her affect was flat and there were no signs of SI/HI/NSSI. In addition to cancer affecting her physically, the patient is experiencing effects across life domains. Some of these were unexpected and now patient can't do the things she likes to, as it causes her pain. This aspect is contributing to a loss of identity, creating further distress. Next session will continue to explore ideas for coping, as well as potentially including some values work.   Gaylyn Rong Counseling Intern

## 2020-04-12 ENCOUNTER — Telehealth: Payer: Self-pay | Admitting: Radiology

## 2020-04-12 NOTE — Telephone Encounter (Addendum)
[id number]: Neuropathy Study  04/12/2020      12:50PM  PHONE CALL: Confirmed I was speaking with Stephanie Chase. Informed patient of upcoming 24 week assessment. Patient is still interested in participating in the above mentioned research study. Confirmed date and time for PROs and assessments for Thursday 04/15/20 at 2 PM. Thanked patient for her time and continued participation in this study.    04/14/2020    11:00AM  Left voicemail to confirm appt for 04/15/20.   Carol Ada, RT(R)(T) Clinical Research Coordinator

## 2020-04-14 ENCOUNTER — Encounter: Payer: Self-pay | Admitting: General Practice

## 2020-04-14 NOTE — Progress Notes (Signed)
[facility] Spiritual Care Note  Left voicemail on behalf of Counseling Intern Gaylyn Rong to notify Ms Spink of the need to postpone their 1:00 appointment. Raquel Sarna will phone ASAP to reschedule.   [place], [place], [facility] Pager [phone] Voicemail [phone]

## 2020-04-15 ENCOUNTER — Other Ambulatory Visit: Payer: Self-pay | Admitting: *Deleted

## 2020-04-15 ENCOUNTER — Inpatient Hospital Stay: Payer: BC Managed Care – PPO | Attending: Hematology

## 2020-04-15 ENCOUNTER — Inpatient Hospital Stay: Payer: BC Managed Care – PPO

## 2020-04-15 ENCOUNTER — Encounter: Payer: Self-pay | Admitting: Hematology & Oncology

## 2020-04-15 ENCOUNTER — Other Ambulatory Visit: Payer: Self-pay

## 2020-04-15 ENCOUNTER — Inpatient Hospital Stay (HOSPITAL_BASED_OUTPATIENT_CLINIC_OR_DEPARTMENT_OTHER): Payer: BC Managed Care – PPO | Admitting: Hematology & Oncology

## 2020-04-15 ENCOUNTER — Encounter: Payer: BC Managed Care – PPO | Admitting: Radiology

## 2020-04-15 VITALS — BP 140/94 | HR 73 | Temp 98.4°F | Resp 19 | Wt 171.0 lb

## 2020-04-15 DIAGNOSIS — C50411 Malignant neoplasm of upper-outer quadrant of right female breast: Secondary | ICD-10-CM

## 2020-04-15 DIAGNOSIS — Z9109 Other allergy status, other than to drugs and biological substances: Secondary | ICD-10-CM | POA: Insufficient documentation

## 2020-04-15 DIAGNOSIS — Z5112 Encounter for antineoplastic immunotherapy: Secondary | ICD-10-CM | POA: Diagnosis present

## 2020-04-15 DIAGNOSIS — E7800 Pure hypercholesterolemia, unspecified: Secondary | ICD-10-CM | POA: Insufficient documentation

## 2020-04-15 DIAGNOSIS — M818 Other osteoporosis without current pathological fracture: Secondary | ICD-10-CM

## 2020-04-15 DIAGNOSIS — R252 Cramp and spasm: Secondary | ICD-10-CM | POA: Insufficient documentation

## 2020-04-15 DIAGNOSIS — M810 Age-related osteoporosis without current pathological fracture: Secondary | ICD-10-CM | POA: Insufficient documentation

## 2020-04-15 DIAGNOSIS — J439 Emphysema, unspecified: Secondary | ICD-10-CM

## 2020-04-15 DIAGNOSIS — Z170 Estrogen receptor positive status [ER+]: Secondary | ICD-10-CM | POA: Diagnosis not present

## 2020-04-15 DIAGNOSIS — R209 Unspecified disturbances of skin sensation: Secondary | ICD-10-CM | POA: Insufficient documentation

## 2020-04-15 DIAGNOSIS — Z720 Tobacco use: Secondary | ICD-10-CM

## 2020-04-15 DIAGNOSIS — F17200 Nicotine dependence, unspecified, uncomplicated: Secondary | ICD-10-CM | POA: Insufficient documentation

## 2020-04-15 HISTORY — DX: Emphysema, unspecified: J43.9

## 2020-04-15 HISTORY — DX: Tobacco use: Z72.0

## 2020-04-15 HISTORY — DX: Pure hypercholesterolemia, unspecified: E78.00

## 2020-04-15 LAB — CBC WITH DIFFERENTIAL (CANCER CENTER ONLY)
Abs Immature Granulocytes: 0.02 10*3/uL (ref 0.00–0.07)
Basophils Absolute: 0 10*3/uL (ref 0.0–0.1)
Basophils Relative: 0 %
Eosinophils Absolute: 0 10*3/uL (ref 0.0–0.5)
Eosinophils Relative: 0 %
HCT: 41.2 % (ref 36.0–46.0)
Hemoglobin: 13.8 g/dL (ref 12.0–15.0)
Immature Granulocytes: 0 %
Lymphocytes Relative: 23 %
Lymphs Abs: 1.6 10*3/uL (ref 0.7–4.0)
MCH: 28.9 pg (ref 26.0–34.0)
MCHC: 33.5 g/dL (ref 30.0–36.0)
MCV: 86.4 fL (ref 80.0–100.0)
Monocytes Absolute: 0.6 10*3/uL (ref 0.1–1.0)
Monocytes Relative: 8 %
Neutro Abs: 4.8 10*3/uL (ref 1.7–7.7)
Neutrophils Relative %: 69 %
Platelet Count: 279 10*3/uL (ref 150–400)
RBC: 4.77 MIL/uL (ref 3.87–5.11)
RDW: 11.9 % (ref 11.5–15.5)
WBC Count: 7 10*3/uL (ref 4.0–10.5)
nRBC: 0 % (ref 0.0–0.2)

## 2020-04-15 LAB — CMP (CANCER CENTER ONLY)
ALT: 16 U/L (ref 0–44)
AST: 20 U/L (ref 15–41)
Albumin: 4.2 g/dL (ref 3.5–5.0)
Alkaline Phosphatase: 78 U/L (ref 38–126)
Anion gap: 7 (ref 5–15)
BUN: 9 mg/dL (ref 6–20)
CO2: 28 mmol/L (ref 22–32)
Calcium: 11 mg/dL — ABNORMAL HIGH (ref 8.9–10.3)
Chloride: 101 mmol/L (ref 98–111)
Creatinine: 0.7 mg/dL (ref 0.44–1.00)
GFR, Estimated: >60 mL/min (ref >60)
Glucose, Bld: 93 mg/dL (ref 70–99)
Potassium: 4 mmol/L (ref 3.5–5.1)
Sodium: 136 mmol/L (ref 135–145)
Total Bilirubin: 0.3 mg/dL (ref 0.3–1.2)
Total Protein: 7.2 g/dL (ref 6.5–8.1)

## 2020-04-15 LAB — VITAMIN D 25 HYDROXY (VIT D DEFICIENCY, FRACTURES): Vit D, 25-Hydroxy: 25.56 ng/mL — ABNORMAL LOW (ref 30–100)

## 2020-04-15 MED ORDER — SODIUM CHLORIDE 0.9% FLUSH
10.0000 mL | INTRAVENOUS | Status: DC | PRN
  Administered 2020-04-15: 10 mL
  Filled 2020-04-15: qty 10

## 2020-04-15 MED ORDER — ALPRAZOLAM 0.25 MG PO TABS: 0.2500 mg | ORAL_TABLET | Freq: Every day | ORAL | 0 refills | Status: DC | PRN

## 2020-04-15 MED ORDER — DIPHENHYDRAMINE HCL 25 MG PO CAPS
ORAL_CAPSULE | ORAL | Status: AC
  Filled 2020-04-15: qty 1

## 2020-04-15 MED ORDER — SODIUM CHLORIDE 0.9 % IV SOLN
Freq: Once | INTRAVENOUS | Status: AC
  Filled 2020-04-15: qty 250

## 2020-04-15 MED ORDER — DIPHENHYDRAMINE HCL 25 MG PO CAPS
25.0000 mg | ORAL_CAPSULE | Freq: Once | ORAL | Status: AC
  Administered 2020-04-15: 25 mg via ORAL

## 2020-04-15 MED ORDER — HEPARIN SOD (PORK) LOCK FLUSH 100 UNIT/ML IV SOLN
500.0000 [IU] | Freq: Once | INTRAVENOUS | Status: AC | PRN
  Administered 2020-04-15: 500 [IU]
  Filled 2020-04-15: qty 5

## 2020-04-15 MED ORDER — ACETAMINOPHEN 325 MG PO TABS
ORAL_TABLET | ORAL | Status: AC
  Filled 2020-04-15: qty 2

## 2020-04-15 MED ORDER — TRASTUZUMAB-QYYP CHEMO 150 MG IV SOLR
450.0000 mg | Freq: Once | INTRAVENOUS | Status: AC
Start: 2020-04-15 — End: 2020-04-15
  Administered 2020-04-15: 450 mg via INTRAVENOUS
  Filled 2020-04-15: qty 21.43

## 2020-04-15 MED ORDER — ACETAMINOPHEN 325 MG PO TABS
650.0000 mg | ORAL_TABLET | Freq: Once | ORAL | Status: AC
  Administered 2020-04-15: 650 mg via ORAL

## 2020-04-15 NOTE — Patient Instructions (Signed)

## 2020-04-15 NOTE — Progress Notes (Signed)
Hematology and Oncology Follow Up Visit  Stephanie Chase [id number] [birth year] 60 y.o. 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 6 months-first dose on 04/15/2020     Interim History:  Ms. Rotolo is in the [place] for follow-up.  She comes in with her friend.  He is a real nice man.  She is still has a lot of her issues.  I just feel that she has so many problems.  Thankfully, her hair is coming back.  This gives her a little bit of relief.   She did go to the dentist to have a tooth pulled.  This was not because of osteonecrosis.  She had an abscess.  We are going down to hold off on any Prolia for right now.  She certainly is okay with this.  She does have some issues with constipation.  She her about Linzess is a method of improving this.  I am not sure that this would be a good idea for her.  She made it through the snowstorm that we had.  Thankfully, she did not have any problems.  Her appetite is doing okay.  She had no nausea or vomiting.  She does need to be on a aromatase inhibitor.  I think she worried about this because of her osteoporosis.  We talked about the possibility of tamoxifen.  Personally, I think that she would do okay with a aromatase inhibitor.  She will start think about this.  Overall, I would have to say that her performance status is ECOG 1.   Medications:  Current Outpatient Medications:  .  acetaminophen (TYLENOL) 500 MG tablet, Take 500-1,000 mg by mouth every 6 (six) hours as needed for moderate pain. , Disp: , Rfl:  .  albuterol (VENTOLIN HFA) 108 (90 Base) MCG/ACT inhaler, Inhale 2 puffs into the lungs every 6 (six)  hours as needed for wheezing or shortness of breath., Disp: 8 g, Rfl: 2 .  ALPRAZolam (XANAX) 0.25 MG tablet, Take 1 tablet (0.25 mg total) by mouth daily as needed for anxiety., Disp: 30 tablet, Rfl: 0 .  baclofen (LIORESAL) 10 MG tablet, Take 1 tablet (10 mg total) by mouth 2 (two) times daily as needed for muscle spasms., Disp: 30 each, Rfl: 1 .  Dexlansoprazole (DEXILANT) 30 MG capsule, Take 1 capsule (30 mg total) by mouth daily. (Patient not taking: Reported on 03/24/2020), Disp: 30 capsule, Rfl: 1 .  fluticasone (CUTIVATE) 0.05 % cream, , Disp: , Rfl:  .  fluticasone (FLONASE) 50 MCG/ACT nasal spray, Place 2 sprays into both nostrils in the morning and at bedtime., Disp: 16 g, Rfl: 5 .  fluticasone (FLOVENT HFA) 110 MCG/ACT inhaler, Inhale 2 puffs into the lungs in the morning and at bedtime., Disp: 1 each, Rfl: 12 .  levothyroxine (SYNTHROID) 75 MCG tablet, Take 75 mcg by mouth daily before breakfast., Disp: , Rfl:  .  lidocaine-prilocaine (EMLA) cream, Apply 1 application topically as needed., Disp: 30 g, Rfl: 1 .  liothyronine (CYTOMEL) 5 MCG tablet, TAKE 2 TABLETS (=10MCG     TOTAL)     DAILY, Disp: 180 tablet, Rfl: 1 .  pantoprazole (PROTONIX) 40 MG tablet, Take 40 mg by  mouth daily as needed., Disp: , Rfl:  No current facility-administered medications for this visit.  Facility-Administered Medications Ordered in Other Visits:  .  alum & mag hydroxide-simeth (MAALOX/MYLANTA) 200-200-20 MG/5ML suspension 30 mL, 30 mL, Oral, Once, Tanner, Lyndon Code., PA-C  Allergies:  Allergies  Allergen Reactions  . Bactrim [Sulfamethoxazole-Trimethoprim] Itching and Rash    Past Medical History, Surgical history, Social history, and Family History were reviewed and updated.  Review of Systems: Review of Systems  Constitutional: Positive for fatigue.  HENT:   Positive for hearing loss and tinnitus.   Eyes: Positive for eye problems.  Respiratory: Positive for cough.   Cardiovascular: Negative.    Gastrointestinal: Positive for abdominal pain and nausea.  Endocrine: Negative.   Genitourinary: Negative.    Musculoskeletal: Positive for arthralgias and myalgias.  Skin: Negative.   Neurological: Positive for light-headedness.  Hematological: Negative.   Psychiatric/Behavioral: Negative.     Physical Exam:  weight is 171 lb (77.6 kg). Her oral temperature is 98.4 F (36.9 C). Her blood pressure is 140/94 (abnormal) and her pulse is 73. Her respiration is 19 and oxygen saturation is 99%.   Wt Readings from Last 3 Encounters:  04/15/20 171 lb (77.6 kg)  03/24/20 169 lb (76.7 kg)  02/26/20 170 lb 12.8 oz (77.5 kg)    Physical Exam Vitals reviewed.  Constitutional:      Comments: She has had bilateral mastectomies.  Mastectomy scars are well-healed.  There is no erythema or warmth or swelling associated with these.  I cannot palpate any bilateral axillary lymph nodes.  HENT:     Head: Normocephalic and atraumatic.  Eyes:     Pupils: Pupils are equal, round, and reactive to light.  Cardiovascular:     Rate and Rhythm: Normal rate and regular rhythm.     Heart sounds: Normal heart sounds.  Pulmonary:     Effort: Pulmonary effort is normal.     Breath sounds: Normal breath sounds.  Abdominal:     General: Bowel sounds are normal.     Palpations: Abdomen is soft.  Musculoskeletal:        General: No tenderness or deformity. Normal range of motion.     Cervical back: Normal range of motion.  Lymphadenopathy:     Cervical: No cervical adenopathy.  Skin:    General: Skin is warm and dry.     Findings: No erythema or rash.  Neurological:     Mental Status: She is alert and oriented to person, place, and time.  Psychiatric:        Behavior: Behavior normal.        Thought Content: Thought content normal.        Judgment: Judgment normal.    Lab Results  Component Value Date   WBC 7.0 04/15/2020   HGB 13.8 04/15/2020   HCT 41.2 04/15/2020   MCV 86.4 04/15/2020   PLT  279 04/15/2020     Chemistry      Component Value Date/Time   NA 136 04/15/2020 0927   K 4.0 04/15/2020 0927   CL 101 04/15/2020 0927   CO2 28 04/15/2020 0927   BUN 9 04/15/2020 0927   CREATININE 0.70 04/15/2020 0927   CREATININE 0.66 09/12/2017 0747      Component Value Date/Time   CALCIUM 11.0 (H) 04/15/2020 0927   ALKPHOS 78 04/15/2020 0927   AST 20 04/15/2020 0927   ALT 16 04/15/2020 0927   BILITOT 0.3 04/15/2020 5284  Impression and Plan: Ms. Silos is is a very nice 60 year old postmenopausal female.  She is originally from [place].  She moved down to [facility].  She has 2 grandchildren already.  Her children live on the [place].  She has early stage breast cancer.  Unfortunately, it is HER-2 positive.  Because this, she required chemotherapy with Herceptin.  I forgot to mention that she had a echocardiogram done back in August.  Her ejection fraction was 60-65%.  I think she sees the cardiologist next month.  I think the one issue is that she really needs to get on aromatase inhibitor therapy.  I we will have to talk to her some more about this when we see her back.    We will plan for another follow-up in 3 weeks.    [name], MD 1/20/202210:08 AM

## 2020-04-16 ENCOUNTER — Telehealth: Payer: Self-pay | Admitting: Hematology & Oncology

## 2020-04-16 ENCOUNTER — Encounter: Payer: Self-pay | Admitting: *Deleted

## 2020-04-16 LAB — CANCER ANTIGEN 27.29: CA 27.29: 19.4 U/mL (ref 0.0–38.6)

## 2020-04-16 NOTE — Telephone Encounter (Signed)
No los 1/20 °

## 2020-04-21 ENCOUNTER — Telehealth: Payer: Self-pay

## 2020-04-21 NOTE — Telephone Encounter (Signed)
Pt called to chk on next appts/ tx, there orig was no los placed from last office visit, appts now placed and pt aware   Kohen Reither

## 2020-04-29 NOTE — Progress Notes (Signed)
Stephanie Chase Patient and Stephanie Chase Counseling Note  Patient identified the past few weeks as being full of overwhelm and pain, saying she doesn't "know if it'll ever get better." Patient processed emotions in a safe space with counselor and identified how cancer has changed her thinking patterns and her personality. The patient described feeling "paralyzed," having a hard time taking chances, and wondering why she cannot "power through" this. Counselor provided validation, empathy, and reflective listening. Counselor utilized silence and metaphors in session. Patient was oriented times three in session. Patient's mood was defeated and sad, with a depressed, flat affect. There were no indications of SI/HI/NSSI. Patient identified thoughts such as "I don't know if it'll ever get better" and "I'm not invincible," which contributed to feelings of defeat and fear, which has lead to her acting "paralyzed" and not doing anything/taking risks. Next session will assess her activities and potentially use CBT interventions.     Gaylyn Rong Counseling Intern

## 2020-05-05 NOTE — Progress Notes (Signed)
Counselor had a brief check-in with patient. Patient feels better emotionally but felt very tired and not up to talking. Patient updated counselor on major happenings in the past week and then rescheduled for next week.   Gaylyn Rong Counseling Intern

## 2020-05-07 ENCOUNTER — Telehealth: Payer: Self-pay | Admitting: Radiology

## 2020-05-07 NOTE — Telephone Encounter (Signed)
[id number]: Neuropathy Study  05/07/2020     8:30AM  PHONE CALL: Confirmed I was speaking with Stephanie Chase. Called to schedule 24 week assessments for neuropathy. After discussion, it would be difficult for patient to find a time to travel to our site for the neuropathy assessments. Patient was agreeable to continuing to complete questionnaires. Plan is to e-mail patient questionnaires to confirmed e-mail address and send a blank copy via mail to patient. Thanked patient for her time and continued support of the above mentioned study.   Carol Ada, RT(R)(T) Clinical Research Coordinator

## 2020-05-10 ENCOUNTER — Inpatient Hospital Stay: Payer: BC Managed Care – PPO

## 2020-05-10 ENCOUNTER — Encounter: Payer: Self-pay | Admitting: Hematology & Oncology

## 2020-05-10 ENCOUNTER — Inpatient Hospital Stay: Payer: BC Managed Care – PPO | Attending: Hematology

## 2020-05-10 ENCOUNTER — Other Ambulatory Visit: Payer: Self-pay | Admitting: Hematology & Oncology

## 2020-05-10 ENCOUNTER — Other Ambulatory Visit: Payer: Self-pay

## 2020-05-10 ENCOUNTER — Inpatient Hospital Stay (HOSPITAL_BASED_OUTPATIENT_CLINIC_OR_DEPARTMENT_OTHER): Payer: BC Managed Care – PPO | Admitting: Hematology & Oncology

## 2020-05-10 VITALS — BP 108/73 | HR 86 | Temp 98.5°F | Resp 18 | Wt 171.0 lb

## 2020-05-10 DIAGNOSIS — E038 Other specified hypothyroidism: Secondary | ICD-10-CM | POA: Diagnosis not present

## 2020-05-10 DIAGNOSIS — C50411 Malignant neoplasm of upper-outer quadrant of right female breast: Secondary | ICD-10-CM

## 2020-05-10 DIAGNOSIS — Z5112 Encounter for antineoplastic immunotherapy: Secondary | ICD-10-CM | POA: Diagnosis present

## 2020-05-10 DIAGNOSIS — Z170 Estrogen receptor positive status [ER+]: Secondary | ICD-10-CM

## 2020-05-10 DIAGNOSIS — M818 Other osteoporosis without current pathological fracture: Secondary | ICD-10-CM

## 2020-05-10 DIAGNOSIS — M810 Age-related osteoporosis without current pathological fracture: Secondary | ICD-10-CM | POA: Insufficient documentation

## 2020-05-10 LAB — CMP (CANCER CENTER ONLY)
ALT: 23 U/L (ref 0–44)
AST: 25 U/L (ref 15–41)
Albumin: 4.3 g/dL (ref 3.5–5.0)
Alkaline Phosphatase: 87 U/L (ref 38–126)
Anion gap: 9 (ref 5–15)
BUN: 11 mg/dL (ref 6–20)
CO2: 25 mmol/L (ref 22–32)
Calcium: 10.8 mg/dL — ABNORMAL HIGH (ref 8.9–10.3)
Chloride: 103 mmol/L (ref 98–111)
Creatinine: 0.74 mg/dL (ref 0.44–1.00)
GFR, Estimated: >60 mL/min (ref >60)
Glucose, Bld: 110 mg/dL — ABNORMAL HIGH (ref 70–99)
Potassium: 3.8 mmol/L (ref 3.5–5.1)
Sodium: 137 mmol/L (ref 135–145)
Total Bilirubin: 0.3 mg/dL (ref 0.3–1.2)
Total Protein: 6.9 g/dL (ref 6.5–8.1)

## 2020-05-10 LAB — CBC WITH DIFFERENTIAL (CANCER CENTER ONLY)
Abs Immature Granulocytes: 0.01 10*3/uL (ref 0.00–0.07)
Basophils Absolute: 0.1 10*3/uL (ref 0.0–0.1)
Basophils Relative: 1 %
Eosinophils Absolute: 0 10*3/uL (ref 0.0–0.5)
Eosinophils Relative: 0 %
HCT: 42 % (ref 36.0–46.0)
Hemoglobin: 14.1 g/dL (ref 12.0–15.0)
Immature Granulocytes: 0 %
Lymphocytes Relative: 22 %
Lymphs Abs: 1.5 10*3/uL (ref 0.7–4.0)
MCH: 28.5 pg (ref 26.0–34.0)
MCHC: 33.6 g/dL (ref 30.0–36.0)
MCV: 84.8 fL (ref 80.0–100.0)
Monocytes Absolute: 0.6 10*3/uL (ref 0.1–1.0)
Monocytes Relative: 9 %
Neutro Abs: 4.6 10*3/uL (ref 1.7–7.7)
Neutrophils Relative %: 68 %
Platelet Count: 280 10*3/uL (ref 150–400)
RBC: 4.95 MIL/uL (ref 3.87–5.11)
RDW: 12.3 % (ref 11.5–15.5)
WBC Count: 6.7 10*3/uL (ref 4.0–10.5)
nRBC: 0 % (ref 0.0–0.2)

## 2020-05-10 LAB — LACTATE DEHYDROGENASE: LDH: 182 U/L (ref 98–192)

## 2020-05-10 MED ORDER — SODIUM CHLORIDE 0.9% FLUSH
10.0000 mL | INTRAVENOUS | Status: DC | PRN
  Administered 2020-05-10: 10 mL
  Filled 2020-05-10: qty 10

## 2020-05-10 MED ORDER — SODIUM CHLORIDE 0.9 % IV SOLN
Freq: Once | INTRAVENOUS | Status: AC
  Filled 2020-05-10: qty 250

## 2020-05-10 MED ORDER — ACETAMINOPHEN 325 MG PO TABS
650.0000 mg | ORAL_TABLET | Freq: Once | ORAL | Status: AC
  Administered 2020-05-10: 650 mg via ORAL

## 2020-05-10 MED ORDER — SODIUM CHLORIDE 0.9 % IV SOLN
450.0000 mg | Freq: Once | INTRAVENOUS | Status: AC
  Administered 2020-05-10: 450 mg via INTRAVENOUS
  Filled 2020-05-10: qty 21.43

## 2020-05-10 MED ORDER — ACETAMINOPHEN 325 MG PO TABS
ORAL_TABLET | ORAL | Status: AC
  Filled 2020-05-10: qty 2

## 2020-05-10 MED ORDER — HEPARIN SOD (PORK) LOCK FLUSH 100 UNIT/ML IV SOLN
500.0000 [IU] | Freq: Once | INTRAVENOUS | Status: AC | PRN
  Administered 2020-05-10: 500 [IU]
  Filled 2020-05-10: qty 5

## 2020-05-10 MED ORDER — DIPHENHYDRAMINE HCL 25 MG PO CAPS
ORAL_CAPSULE | ORAL | Status: AC
  Filled 2020-05-10: qty 1

## 2020-05-10 MED ORDER — DIPHENHYDRAMINE HCL 25 MG PO CAPS
25.0000 mg | ORAL_CAPSULE | Freq: Once | ORAL | Status: AC
  Administered 2020-05-10: 25 mg via ORAL

## 2020-05-10 MED ORDER — LINACLOTIDE 72 MCG PO CAPS: 72.0000 ug | ORAL_CAPSULE | Freq: Every day | ORAL | 3 refills | Status: DC

## 2020-05-10 NOTE — Progress Notes (Signed)
Hematology and Oncology Follow Up Visit  Stephanie Chase [id number] December 12, 1960 60 y.o. 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 6 months-first dose on 04/15/2020     Interim History:  Stephanie Chase is at the [facility] for follow-up.  Unfortunately, she still has a lot of issues that she is trying to manage.  I just feel bad for her.  She really needs to see somebody to try to help work through a lot of the emotional issues.  Unfortunately, I am not sure who we have at the cancer center to be able to help her out.  We will have to look into this.  She complained of headache and some fogginess.  She has some dizziness.  She has pain with the left ear.  I looked at both eardrums, and did not see anything with fluid or erythema or swelling.  We probably need to get an MRI of the brain.  She has very early stage breast cancer.  I just cannot imagine that she would all of a sudden have metastatic disease.  I told her that it may take a good year or more before she starts to get over some of the side effects of treatment.  She only had Taxol.,  I am not sure she even completed all the Taxol.  However, she certainly may just be experiencing some of the effects that could be long-term.  I am glad that her hair has come back.  I just do not think she is ready to talk about antiestrogen therapy.  She already has hot flashes.  She still has abdominal issues with diarrhea and constipation.  I called in some Linzess for her.  She just is having a challenging time trying to manage all of the issues that have happened since chemotherapy.  I really do hope and pray that these  issues will slowly resolve over time.  Currently, I would say her performance status is ECOG 2.     Medications:  Current Outpatient Medications:  .  acetaminophen (TYLENOL) 500 MG tablet, Take 500-1,000 mg by mouth every 6 (six) hours as needed for moderate pain. , Disp: , Rfl:  .  albuterol (VENTOLIN HFA) 108 (90 Base) MCG/ACT inhaler, Inhale 2 puffs into the lungs every 6 (six) hours as needed for wheezing or shortness of breath., Disp: 8 g, Rfl: 2 .  ALPRAZolam (XANAX) 0.25 MG tablet, Take 1 tablet (0.25 mg total) by mouth daily as needed for anxiety., Disp: 30 tablet, Rfl: 0 .  baclofen (LIORESAL) 10 MG tablet, Take 1 tablet (10 mg total) by mouth 2 (two) times daily as needed for muscle spasms., Disp: 30 each, Rfl: 1 .  Dexlansoprazole (DEXILANT) 30 MG capsule, Take 1 capsule (30 mg total) by mouth daily. (Patient not taking: Reported on 03/24/2020), Disp: 30 capsule, Rfl: 1 .  fluticasone (CUTIVATE) 0.05 % cream, , Disp: , Rfl:  .  fluticasone (FLONASE) 50 MCG/ACT nasal spray, Place 2 sprays into both nostrils in the morning and at bedtime., Disp: 16 g, Rfl: 5 .  fluticasone (FLOVENT HFA) 110 MCG/ACT inhaler, Inhale 2 puffs into the lungs in the morning and at bedtime., Disp: 1 each, Rfl: 12 .  levothyroxine (SYNTHROID) 75 MCG tablet, Take 75 mcg by mouth daily before breakfast., Disp: , Rfl:  .  lidocaine-prilocaine (EMLA) cream, Apply 1 application topically as needed., Disp: 30 g, Rfl: 1 .  liothyronine (CYTOMEL) 5 MCG tablet, TAKE 2 TABLETS (=10MCG     TOTAL)     DAILY, Disp: 180 tablet, Rfl: 1 .  pantoprazole (PROTONIX) 40 MG tablet, Take 40 mg by mouth daily as needed., Disp: , Rfl:  No current facility-administered medications for this visit.  Facility-Administered Medications Ordered in Other Visits:  .  alum & mag hydroxide-simeth (MAALOX/MYLANTA) 200-200-20 MG/5ML suspension 30 mL, 30 mL, Oral, Once, Tanner, Van E., PA-C .  sodium chloride flush (NS) 0.9 % injection 10 mL, 10  mL, Intracatheter, PRN, Truitt Merle, MD, 10 mL at 05/10/20 9233  Allergies:  Allergies  Allergen Reactions  . Bactrim [Sulfamethoxazole-Trimethoprim] Itching and Rash    Past Medical History, Surgical history, Social history, and Family History were reviewed and updated.  Review of Systems: Review of Systems  Constitutional: Positive for fatigue.  HENT:   Positive for hearing loss and tinnitus.   Eyes: Positive for eye problems.  Respiratory: Positive for cough.   Cardiovascular: Negative.   Gastrointestinal: Positive for abdominal pain and nausea.  Endocrine: Negative.   Genitourinary: Negative.    Musculoskeletal: Positive for arthralgias and myalgias.  Skin: Negative.   Neurological: Positive for light-headedness.  Hematological: Negative.   Psychiatric/Behavioral: Negative.     Physical Exam:  vitals were not taken for this visit.   Wt Readings from Last 3 Encounters:  04/15/20 171 lb (77.6 kg)  03/24/20 169 lb (76.7 kg)  02/26/20 170 lb 12.8 oz (77.5 kg)    Physical Exam Vitals reviewed.  Constitutional:      Comments: She has had bilateral mastectomies.  Mastectomy scars are well-healed.  There is no erythema or warmth or swelling associated with these.  I cannot palpate any bilateral axillary lymph nodes.  HENT:     Head: Normocephalic and atraumatic.  Eyes:     Pupils: Pupils are equal, round, and reactive to light.  Cardiovascular:     Rate and Rhythm: Normal rate and regular rhythm.     Heart sounds: Normal heart sounds.  Pulmonary:     Effort: Pulmonary effort is normal.     Breath sounds: Normal breath sounds.  Abdominal:     General: Bowel sounds are normal.     Palpations: Abdomen is soft.  Musculoskeletal:        General: No tenderness or deformity. Normal range of motion.     Cervical back: Normal range of motion.  Lymphadenopathy:     Cervical: No cervical adenopathy.  Skin:    General: Skin is warm and dry.     Findings: No erythema or rash.   Neurological:     Mental Status: She is alert and oriented to person, place, and time.  Psychiatric:        Behavior: Behavior normal.        Thought Content: Thought content normal.        Judgment: Judgment normal.    Lab Results  Component Value Date   WBC 6.7 05/10/2020   HGB 14.1 05/10/2020   HCT 42.0 05/10/2020   MCV 84.8 05/10/2020   PLT 280 05/10/2020     Chemistry      Component Value Date/Time   NA 137 05/10/2020 0854   K 3.8 05/10/2020 0854   CL 103 05/10/2020 0854  CO2 25 05/10/2020 0854   BUN 11 05/10/2020 0854   CREATININE 0.74 05/10/2020 0854   CREATININE 0.66 09/12/2017 0747      Component Value Date/Time   CALCIUM 10.8 (H) 05/10/2020 0854   ALKPHOS 87 05/10/2020 0854   AST 25 05/10/2020 0854   ALT 23 05/10/2020 0854   BILITOT 0.3 05/10/2020 0854      Impression and Plan: Stephanie Chase is is a very nice 81 year old postmenopausal female.  She is originally from [place].  She moved down to [facility].  She has 2 grandchildren already.  Her children live on the [place].  She has early stage breast cancer.  Unfortunately, it is HER-2 positive.  Because this, she required chemotherapy with Herceptin.  I we will see what an MRI of the brain shows.  I would like to think that this will be negative.  Again, this just may take a lot of time before she recovers from the chemotherapy.  I cannot imagine that Herceptin really is causing problems for her.  [place] would know this is to stop the Herceptin and I just do not think we have to do that.  We will still get her back monthly.  Hopefully, it should be feeling a little bit better.    [name], MD 2/14/20229:49 AM

## 2020-05-10 NOTE — Patient Instructions (Signed)
Trastuzumab injection for infusion What is this medicine? TRASTUZUMAB (tras TOO zoo mab) is a monoclonal antibody. It is used to treat breast cancer and stomach cancer. This medicine may be used for other purposes; ask your health care provider or pharmacist if you have questions. COMMON BRAND NAME(S): Herceptin, Herzuma, KANJINTI, Ogivri, Ontruzant, Trazimera What should I tell my health care provider before I take this medicine? They need to know if you have any of these conditions:  heart disease  heart failure  lung or breathing disease, like asthma  an unusual or allergic reaction to trastuzumab, benzyl alcohol, or other medications, foods, dyes, or preservatives  pregnant or trying to get pregnant  breast-feeding How should I use this medicine? This drug is given as an infusion into a vein. It is administered in a hospital or clinic by a specially trained health care professional. Talk to your pediatrician regarding the use of this medicine in children. This medicine is not approved for use in children. Overdosage: If you think you have taken too much of this medicine contact a poison control center or emergency room at once. NOTE: This medicine is only for you. Do not share this medicine with others. What if I miss a dose? It is important not to miss a dose. Call your doctor or health care professional if you are unable to keep an appointment. What may interact with this medicine? This medicine may interact with the following medications:  certain types of chemotherapy, such as daunorubicin, doxorubicin, epirubicin, and idarubicin This list may not describe all possible interactions. Give your health care provider a list of all the medicines, herbs, non-prescription drugs, or dietary supplements you use. Also tell them if you smoke, drink alcohol, or use illegal drugs. Some items may interact with your medicine. What should I watch for while using this medicine? Visit your  doctor for checks on your progress. Report any side effects. Continue your course of treatment even though you feel ill unless your doctor tells you to stop. Call your doctor or health care professional for advice if you get a fever, chills or sore throat, or other symptoms of a cold or flu. Do not treat yourself. Try to avoid being around people who are sick. You may experience fever, chills and shaking during your first infusion. These effects are usually mild and can be treated with other medicines. Report any side effects during the infusion to your health care professional. Fever and chills usually do not happen with later infusions. Do not become pregnant while taking this medicine or for 7 months after stopping it. Women should inform their doctor if they wish to become pregnant or think they might be pregnant. Women of child-bearing potential will need to have a negative pregnancy test before starting this medicine. There is a potential for serious side effects to an unborn child. Talk to your health care professional or pharmacist for more information. Do not breast-feed an infant while taking this medicine or for 7 months after stopping it. Women must use effective birth control with this medicine. What side effects may I notice from receiving this medicine? Side effects that you should report to your doctor or health care professional as soon as possible:  allergic reactions like skin rash, itching or hives, swelling of the face, lips, or tongue  chest pain or palpitations  cough  dizziness  feeling faint or lightheaded, falls  fever  general ill feeling or flu-like symptoms  signs of worsening heart failure like   breathing problems; swelling in your legs and feet  unusually weak or tired Side effects that usually do not require medical attention (report to your doctor or health care professional if they continue or are bothersome):  bone pain  changes in  taste  diarrhea  joint pain  nausea/vomiting  weight loss This list may not describe all possible side effects. Call your doctor for medical advice about side effects. You may report side effects to FDA at 1-800-FDA-1088. Where should I keep my medicine? This drug is given in a hospital or clinic and will not be stored at home. NOTE: This sheet is a summary. It may not cover all possible information. If you have questions about this medicine, talk to your doctor, pharmacist, or health care provider.  2020 Elsevier/Gold Standard (2016-03-07 14:37:52)  

## 2020-05-10 NOTE — Patient Instructions (Signed)

## 2020-05-11 ENCOUNTER — Encounter: Payer: Self-pay | Admitting: *Deleted

## 2020-05-11 ENCOUNTER — Other Ambulatory Visit: Payer: Self-pay | Admitting: Hematology & Oncology

## 2020-05-12 ENCOUNTER — Other Ambulatory Visit: Payer: Self-pay | Admitting: Gastroenterology

## 2020-05-13 ENCOUNTER — Encounter: Payer: Self-pay | Admitting: Neurology

## 2020-05-13 ENCOUNTER — Ambulatory Visit (INDEPENDENT_AMBULATORY_CARE_PROVIDER_SITE_OTHER): Payer: BC Managed Care – PPO | Admitting: Neurology

## 2020-05-13 VITALS — BP 133/94 | HR 79 | Ht 65.0 in | Wt 170.4 lb

## 2020-05-13 DIAGNOSIS — R202 Paresthesia of skin: Secondary | ICD-10-CM | POA: Diagnosis not present

## 2020-05-13 DIAGNOSIS — C50911 Malignant neoplasm of unspecified site of right female breast: Secondary | ICD-10-CM | POA: Diagnosis not present

## 2020-05-13 HISTORY — DX: Paresthesia of skin: R20.2

## 2020-05-13 NOTE — Progress Notes (Signed)
Chief Complaint  Patient presents with  . Numbness and Tingling    New Pt  "left arm and foot, unsure if chemotherapy related"    HISTORICAL  Stephanie Chase is a 60 year old right-handed female, seen in request by her primary care physician Dr. Theda Sers, [place], for evaluation of left arm and foot paresthesia, initial evaluation was on May 13, 2020  I reviewed and summarized the referring note.PMHx. Hypothyroidism, on supplemnt Right breast cancer October 05 2019, her-2 positive, oncologist is Dr.Ennever, consider start her on chemotherapy with herceptin. 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, only received cream as needed  He began to notice gradual onset of left hand symptoms in September 2021, gradual onset involving all 5 fingers on her left hand, she also noticed tension at left shoulder blade, left elbow discomfort, also noticed intermittent left foot paresthesia, left low back pain, lasting for few days, sometimes radiating to hip  In addition, she complains a lot of stress, complains frequent headaches, holoacranial headache with light noise sensitivity, dizziness, lasting for few days, she has to hold on to keep her balance sometimes, also complains of brain fog,  MRI of brain with without contrast is ordered by her oncologist   REVIEW OF SYSTEMS: Full 14 system review of systems performed and notable only for as above All other review of systems were negative.  ALLERGIES: Allergies  Allergen Reactions  . Bactrim [Sulfamethoxazole-Trimethoprim] Itching and Rash    HOME MEDICATIONS: Current Outpatient Medications  Medication Sig Dispense Refill  . acetaminophen (TYLENOL) 500 MG tablet Take 500-1,000 mg by mouth every 6 (six) hours as needed for moderate pain.     Marland Kitchen albuterol (VENTOLIN HFA) 108 (90 Base) MCG/ACT inhaler Inhale 2 puffs into the lungs every 6 (six) hours as needed for wheezing or shortness of breath. 8 g 2  . ALPRAZolam (XANAX) 0.25 MG tablet  Take 1 tablet (0.25 mg total) by mouth daily as needed for anxiety. 30 tablet 0  . baclofen (LIORESAL) 10 MG tablet Take 1 tablet (10 mg total) by mouth 2 (two) times daily as needed for muscle spasms. 30 each 1  . Dexlansoprazole (DEXILANT) 30 MG capsule Take 1 capsule (30 mg total) by mouth daily. 30 capsule 1  . fluticasone (FLONASE) 50 MCG/ACT nasal spray Place 2 sprays into both nostrils in the morning and at bedtime. 16 g 5  . levothyroxine (SYNTHROID) 75 MCG tablet Take 75 mcg by mouth daily before breakfast.    . lidocaine-prilocaine (EMLA) cream Apply 1 application topically as needed. 30 g 1  . LINZESS 72 MCG capsule TAKE 1 CAPSULE BY MOUTH DAILY BEFORE BREAKFAST. 30 capsule 3  . liothyronine (CYTOMEL) 5 MCG tablet TAKE 2 TABLETS (=10MCG     TOTAL)     DAILY 180 tablet 1  . pantoprazole (PROTONIX) 40 MG tablet Take 40 mg by mouth daily as needed.    . fluticasone (CUTIVATE) 0.05 % cream     . fluticasone (FLOVENT HFA) 110 MCG/ACT inhaler Inhale 2 puffs into the lungs in the morning and at bedtime. 1 each 12   No current facility-administered medications for this visit.   Facility-Administered Medications Ordered in Other Visits  Medication Dose Route Frequency Provider Last Rate Last Admin  . alum & mag hydroxide-simeth (MAALOX/MYLANTA) 200-200-20 MG/5ML suspension 30 mL  30 mL Oral Once Harle Stanford., PA-C        PAST MEDICAL HISTORY: Past Medical History:  Diagnosis Date  . Anemia   . Anxiety   .  Asthma   . Breast cancer ([facility])    right, 05/13/20 completed Taxol, on Herceptin  . Cervical paraspinous muscle spasm   . COPD (chronic obstructive pulmonary disease) ([facility])    emphysema  . Discoid lupus   . Discoid lupus   . Family history of bladder cancer   . Family history of BRCA gene mutation   . Family history of breast cancer   . Family history of prostate cancer   . GERD (gastroesophageal reflux disease)   . Hypercholesteremia   . Hypothyroidism   . Neuropathy   .  Pneumonia   . Thyroid disease     PAST SURGICAL HISTORY: Past Surgical History:  Procedure Laterality Date  . ABLATION ON ENDOMETRIOSIS  2016  . APPLICATION OF A-CELL OF EXTREMITY Right 10/22/2019   Procedure: EXCISION OF RIGHT BREAST WOUND WITH PRIMARY CLOSURE;  Surgeon: [name] Going, DO;  Location: [facility];  Service: Plastics;  Laterality: Right;  . BREAST LUMPECTOMY    . DEBRIDEMENT AND CLOSURE WOUND Right 10/22/2019   Procedure: Excision of right breast wound;  Surgeon: [name] Going, DO;  Location: [facility];  Service: Plastics;  Laterality: Right;  . MASTECTOMY W/ SENTINEL NODE BIOPSY Bilateral 09/05/2019   Procedure: BILATERAL MASTECTOMY WITH RIGHT SENTINEL LYMPH NODE BIOPSY;  Surgeon: Stark Klein, MD;  Location: [facility];  Service: General;  Laterality: Bilateral;  COMBINED WITH REGIONAL FOR POST OP PAIN  . PORTACATH PLACEMENT Left 09/05/2019   Procedure: INSERTION PORT-A-CATH WITH ULTRASOUND GUIDANCE;  Surgeon: Stark Klein, MD;  Location: [facility];  Service: General;  Laterality: Left;  . TONSILLECTOMY    . TUBAL LIGATION    . WISDOM TOOTH EXTRACTION      FAMILY HISTORY: Family History  Problem Relation Age of Onset  . Breast cancer Mother 64  . Diabetes Mother   . Bladder Cancer Mother 75       ureteral cancer  . Cancer Mother        blood cancer  . Heart attack Father   . Hyperlipidemia Father   . Hypertension Father   . Alzheimer's disease Father   . Prostate cancer Father        dx. >50  . Alzheimer's disease Other   . Cancer Maternal Uncle        prostate or colon  . BRCA 1/2 Daughter   . Breast cancer Other 45       maternal great-aunt  . Cancer Cousin        unknown type; dx. in her 51s (maternal cousin)    SOCIAL HISTORY: Social History   Socioeconomic History  . Marital status: Divorced    Spouse name: Not on file  . Number of children: 2  . Years of education: Not on file  . Highest education level: Some college, no degree  Occupational  History  . Not on file  Tobacco Use  . Smoking status: Current Every Day Smoker    Packs/day: 0.50    Years: 50.00    Pack years: 25.00    Types: Cigarettes    Start date: 108  . Smokeless tobacco: Never Used  Vaping Use  . Vaping Use: Never used  Substance and Sexual Activity  . Alcohol use: Yes    Comment: once a month  . Drug use: No  . Sexual activity: Yes    Partners: Male    Birth control/protection: Surgical    Comment: BTL and ablation  Other Topics Concern  . Not on  file  Social History Narrative  . Not on file   Social Determinants of Health   Financial Resource Strain: Not on file  Food Insecurity: Not on file  Transportation Needs: Not on file  Physical Activity: Not on file  Stress: Not on file  Social Connections: Not on file  Intimate Partner Violence: Not on file     PHYSICAL EXAM   Vitals:   05/13/20 0905  BP: (!) 133/94  Pulse: 79  Weight: 170 lb 6.4 oz (77.3 kg)  Height: '5\' 5"'  (1.651 m)   Not recorded     Body mass index is 28.36 kg/m.  PHYSICAL EXAMNIATION:  Gen: NAD, conversant, well nourised, well groomed                     Cardiovascular: Regular rate rhythm, no peripheral edema, warm, nontender. Eyes: Conjunctivae clear without exudates or hemorrhage Neck: Supple, no carotid bruits. Pulmonary: Clear to auscultation bilaterally   NEUROLOGICAL EXAM:  MENTAL STATUS: Speech:    Speech is normal; fluent and spontaneous with normal comprehension.  Cognition:     Orientation to time, place and person     Normal recent and remote memory     Normal Attention span and concentration     Normal Language, naming, repeating,spontaneous speech     Fund of knowledge   CRANIAL NERVES: CN II: Visual fields are full to confrontation. Pupils are round equal and briskly reactive to light. CN III, IV, VI: extraocular movement are normal. No ptosis. CN V: Facial sensation is intact to light touch CN VII: Face is symmetric with normal eye  closure  CN VIII: Hearing is normal to causal conversation. CN IX, X: Phonation is normal. CN XI: Head turning and shoulder shrug are intact  MOTOR: There is no pronator drift of out-stretched arms. Muscle bulk and tone are normal. Muscle strength is normal.  REFLEXES: Reflexes are 2+ and symmetric at the biceps, triceps, knees, and ankles. Plantar responses are flexor.  SENSORY: Intact to light touch, pinprick and vibratory sensation are intact in fingers and toes.  COORDINATION: There is no trunk or limb dysmetria noted.  GAIT/STANCE: Posture is normal. Gait is steady with normal steps, base, arm swing, and turning. Heel and toe walking are normal. Tandem gait is normal.  Romberg is absent.   DIAGNOSTIC DATA (LABS, IMAGING, TESTING) - I reviewed patient records, labs, notes, testing and imaging myself where available.   ASSESSMENT AND PLAN  Dawson Hollman is a 60 y.o. female    Left hand paresthesia History of right breast cancer, status post right lobectomy  Differentiation diagnosis include left focal neuropathy, such as carpal tunnel or ulnar neuropathy, or cervical radiculopathy,  MRI of the brain with without contrast is pending  EMG nerve conduction study  Marcial Pacas, M.D. Ph.D.  [facility] [address], [place], [place] [address] Ph: [phone] Fax: [phone]  CC:  Janie Morning, [place],  [place] [address]  Delsa Bern, MD

## 2020-05-14 ENCOUNTER — Telehealth: Payer: Self-pay | Admitting: Radiology

## 2020-05-14 ENCOUNTER — Encounter: Payer: Self-pay | Admitting: Neurology

## 2020-05-14 NOTE — Telephone Encounter (Signed)
U6333: Neuropathy Study   05/14/20     1:40PM  PHONE CALL: Confirmed I was speaking with Stephanie Chase. Informed patient this call was to inquire if patient had a chance to complete questionnaires sent via e-mail and mail. Patient stated she did receive packet, but has not had an opportunity to complete them due to working, how she is feeling, and multiple other appts. I expressed my understanding and informed patient if she had any questions or concerns, she could reach out to me anytime. Patient voiced understanding. She stated she would attempt to complete questionnaires this evening and return to me shortly after. Patient was thanked for her time and continued support of the above mentioned clinical trial.  Carol Ada, RT(R)(T) Clinical Research Coordinator

## 2020-05-19 ENCOUNTER — Encounter: Payer: Self-pay | Admitting: *Deleted

## 2020-05-19 NOTE — Progress Notes (Signed)
Patient is worried as her neurological symptoms are starting to worsen, but her MRI is not scheduled until 05/30/20. She wants to know if she can go somewhere else to be seen sooner.  Since patient requires an open MRI she needs to be seen at GI. I called that office and they did have an appointment open up for tomorrow. Patient's MRI scheduled for that time. Called patient and informed her of new appointment time, and that they would like her to arrive by 10:00am. She understood.   Oncology Nurse Navigator Documentation  Oncology Nurse Navigator Flowsheets 05/19/2020  Abnormal Finding Date -  Confirmed Diagnosis Date -  Diagnosis Status -  Planned Course of Treatment -  Phase of Treatment -  Chemotherapy Actual Start Date: -  Chemotherapy Actual End Date: -  Targeted Therapy Actual Start Date: -  Surgery Actual Start Date: -  Navigator Follow Up Date: -  Navigator Follow Up Reason: -  Navigation Complete Date: -  Post Navigation: Continue to Follow Patient? 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  Referral Date to RadOnc/MedOnc -  Navigator Encounter Type Telephone  Telephone Incoming Call  [facility] Date -  Multidisiplinary Clinic Type -  Treatment Initiated Date -  Patient Visit Type MedOnc  Treatment Phase Active Tx  Barriers/Navigation Needs Coordination of Care;Education  Education Other  Interventions Coordination of Care;Education;Medication Assistance;Psycho-Social Support  Acuity Level 3-Moderate Needs (3-4 Barriers Identified)  Referrals -  Coordination of Care Appts;Radiology  Education Method Verbal  Support Groups/Services Friends and Family  Time Spent with Patient 4  Genetic Counseling Type -  Genetic Counseling Date -  Plastic Surgery Consult Date -

## 2020-05-20 ENCOUNTER — Other Ambulatory Visit: Payer: Self-pay | Admitting: Hematology

## 2020-05-20 ENCOUNTER — Ambulatory Visit
Admission: RE | Admit: 2020-05-20 | Discharge: 2020-05-20 | Disposition: A | Discharge status: Home / Self Care | Payer: BC Managed Care – PPO | Source: Ambulatory Visit | Attending: Hematology & Oncology | Admitting: Hematology & Oncology

## 2020-05-20 ENCOUNTER — Encounter: Payer: Self-pay | Admitting: *Deleted

## 2020-05-20 DIAGNOSIS — Z170 Estrogen receptor positive status [ER+]: Secondary | ICD-10-CM

## 2020-05-20 DIAGNOSIS — C50411 Malignant neoplasm of upper-outer quadrant of right female breast: Secondary | ICD-10-CM

## 2020-05-20 IMAGING — MR MR HEAD WO/W CM
12 series · 48 of 48 positions shown · IV contrast (multihance)
Comparison: None.

CLINICAL DATA: Breast cancer surveillance. Dizziness. Left ear
pain.

EXAM:
MRI HEAD WITHOUT AND WITH CONTRAST
TECHNIQUE: Multiplanar, multiecho pulse sequences of the brain and surrounding
structures were obtained without and with intravenous contrast.
CONTRAST:  15mL MULTIHANCE GADOBENATE DIMEGLUMINE 529 MG/ML IV SOLN

[Series 2: t1_se_sag · sagittal · 5.0mm · 0.45mm/px · 1 of 21 slices shown]
[im 1/21]
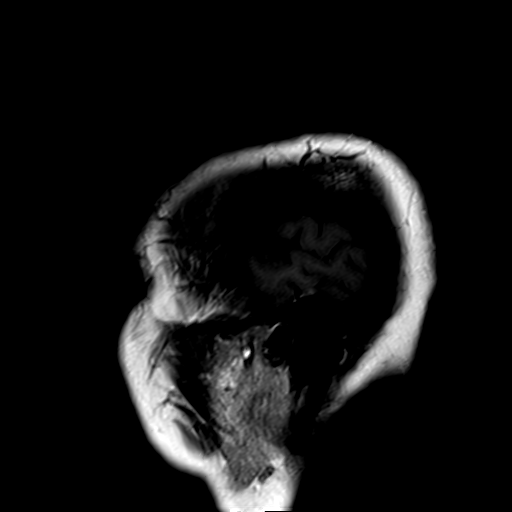

[Series 3: ep2d_diff_3 · axial · 3.0mm · 1.80mm/px · z∈[-29,+112]mm · 5 of 96 slices shown]
[im 1/96]
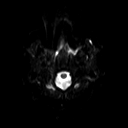
[im 24/96]
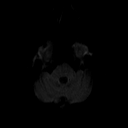
[im 48/96]
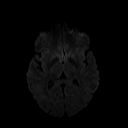
[im 72/96]
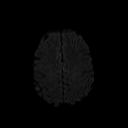
[im 96/96]
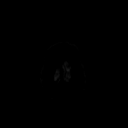

[Series 5: ep2d_diff_cor · coronal · 5.0mm · 1.77mm/px · 3 of 47 slices shown]
[im 1/47]
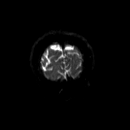
[im 24/47]
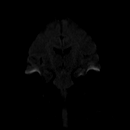
[im 47/47]
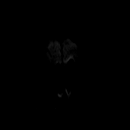

[Series 6: ep2d_diff_cor_adc · coronal · 5.0mm · 1.77mm/px · 2 of 24 slices shown]
[im 1/24]
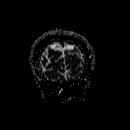
[im 24/24]
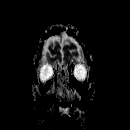

[Series 7: mip_images(sw) · axial · 16.0mm · 0.90mm/px · z∈[-29,+114]mm · 5 of 73 slices shown]
[im 1/73]
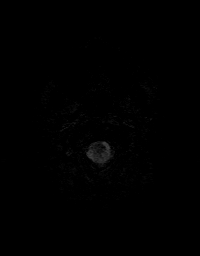
[im 19/73]
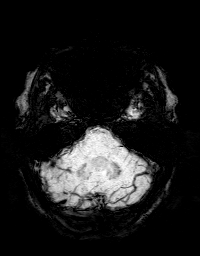
[im 37/73]
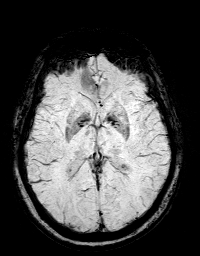
[im 55/73]
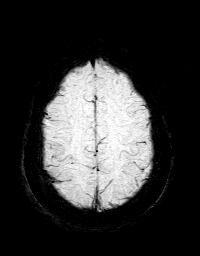
[im 73/73]
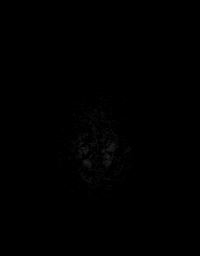

[Series 8: swi_images · axial · 2.0mm · 0.90mm/px · z∈[-36,+121]mm · 5 of 80 slices shown]
[im 1/80]
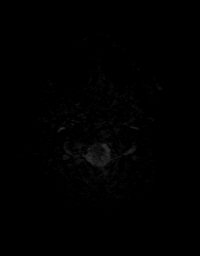
[im 20/80]
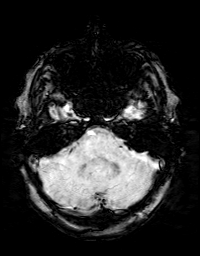
[im 40/80]
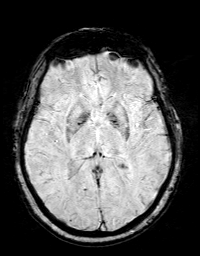
[im 60/80]
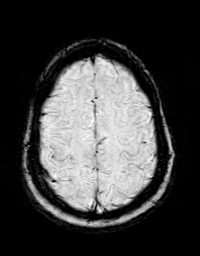
[im 80/80]
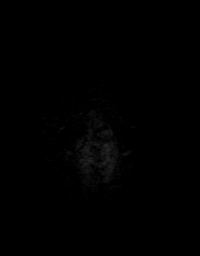

[Series 9: FLAIR · axial · 3.0mm · 0.43mm/px · z∈[-36,+120]mm · 2 of 27 slices shown]
[im 1/27]
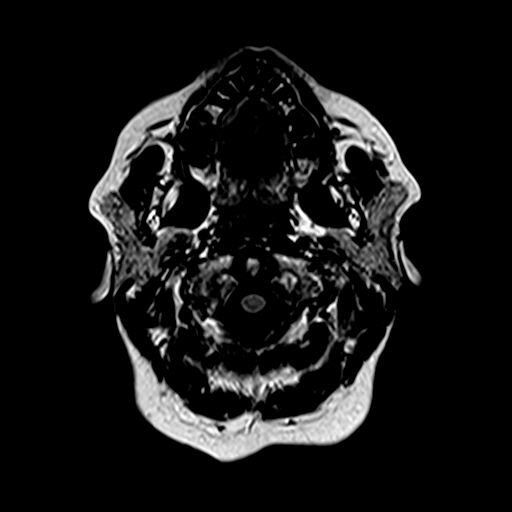
[im 27/27]
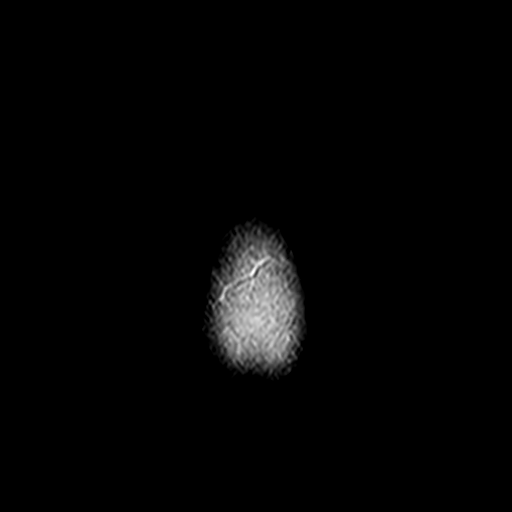

[Series 10: t2_tse_tra_512 · axial · 5.0mm · 0.60mm/px · z∈[-26,+111]mm · 2 of 24 slices shown]
[im 1/24]
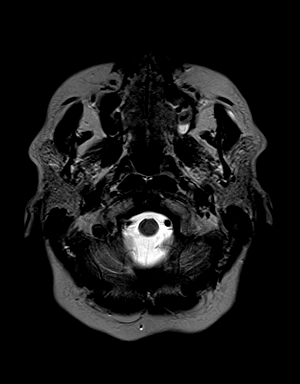
[im 24/24]
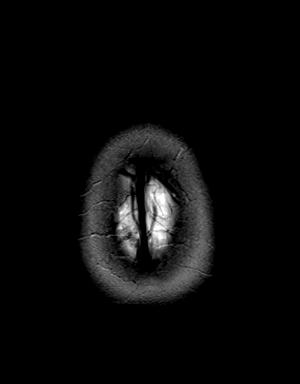

[Series 11: t1_mpr_tra · axial · 1.0mm · 0.72mm/px · z∈[-37,+122]mm · 10 of 160 slices shown]
[im 1/160]
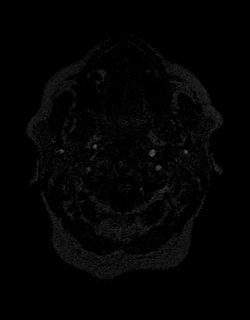
[im 18/160]
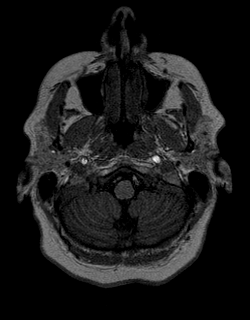
[im 36/160]
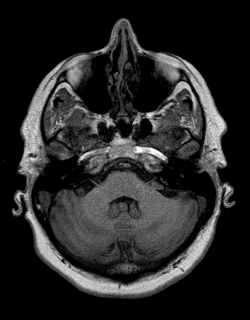
[im 54/160]
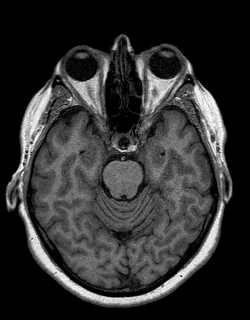
[im 71/160]
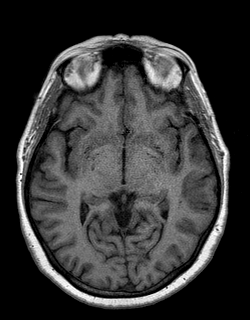
[im 89/160]
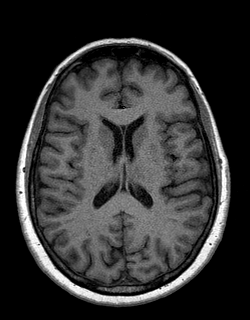
[im 107/160]
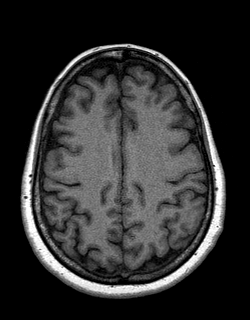
[im 124/160]
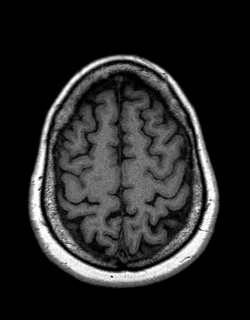
[im 142/160]
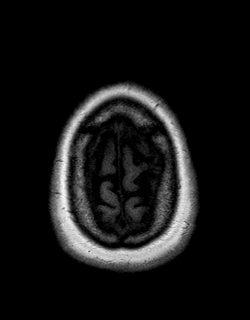
[im 160/160]
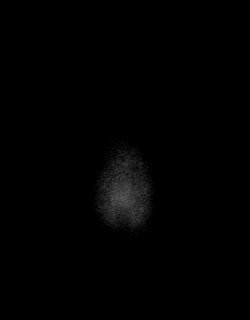

[Series 13: T1 post-contrast · coronal · 5.0mm · 0.72mm/px · 2 of 26 slices shown (1 of 2)]
[im 1/26]
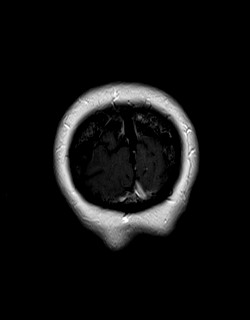
[im 26/26]
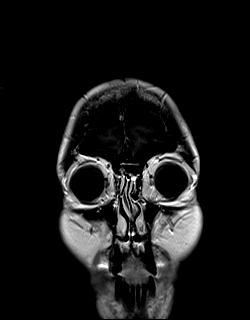

[Series 14: post t1_mpr_tra · axial · 1.0mm · 0.72mm/px · z∈[-37,+122]mm · 10 of 160 slices shown]
[im 1/160]
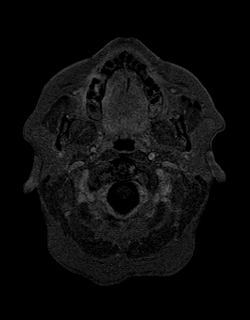
[im 18/160]
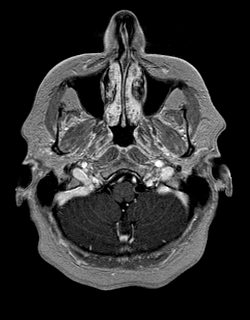
[im 36/160]
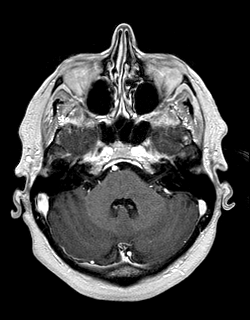
[im 54/160]
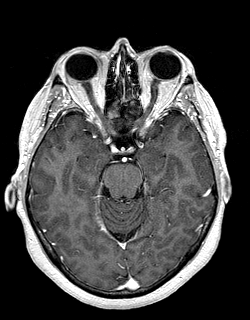
[im 71/160]
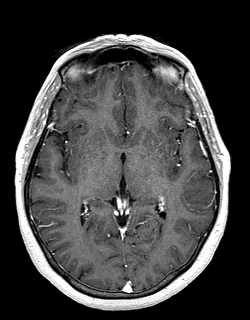
[im 89/160]
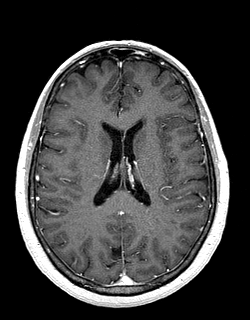
[im 107/160]
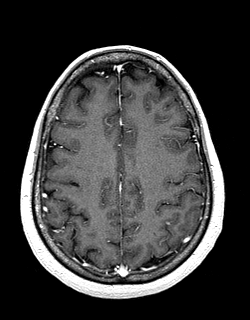
[im 124/160]
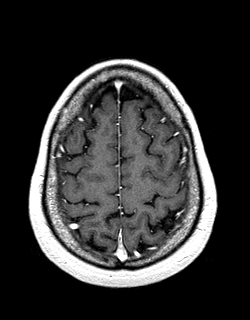
[im 142/160]
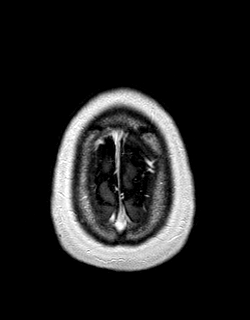
[im 160/160]
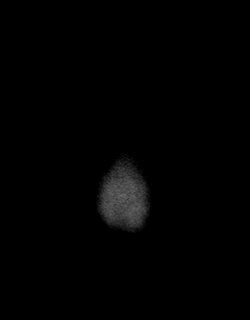

[Series 15: T1 post-contrast · sagittal · 5.0mm · 0.45mm/px · 1 of 21 slices shown (2 of 2)]
[im 1/21]
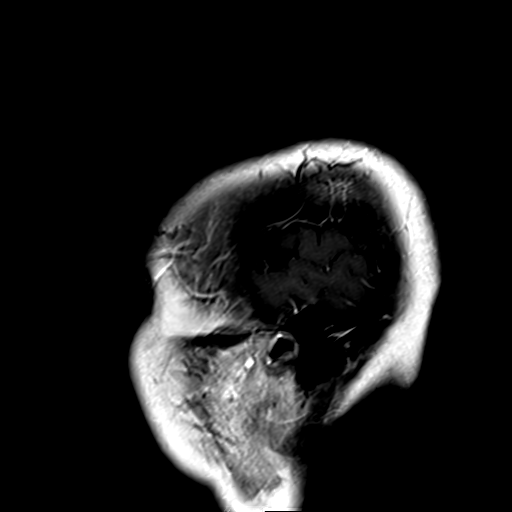

[48 of 48 positions shown; findings below may reference images not displayed]

FINDINGS: Brain: No acute infarction, hemorrhage, hydrocephalus, extra-axial
collection or mass lesion. There are a few scattered punctate
T2/FLAIR hyperintensities within the white matter, most likely
related to chronic microvascular ischemic disease and within normal
limits for age. No abnormal enhancement.

Vascular: Major arterial flow voids are maintained at the skull
base.

Skull and upper cervical spine: Normal marrow signal.

Sinuses/Orbits: Mild paranasal sinus mucosal thickening without
air-fluid levels. Unremarkable orbits.

Other: No sizable mastoid effusions.
IMPRESSION: No evidence of acute intracranial abnormality or metastatic disease.

## 2020-05-20 MED ORDER — GADOBENATE DIMEGLUMINE 529 MG/ML IV SOLN
15.0000 mL | Freq: Once | INTRAVENOUS | Status: AC | PRN
  Administered 2020-05-20: 15 mL via INTRAVENOUS

## 2020-05-23 NOTE — Progress Notes (Addendum)
CARDIO-ONCOLOGY CLINIC NOTE  Referring Physician: Dr. Burr Medico Primary Care: Delsa Bern, MD   HPI:  [name] is a 60 y.o. with COPD with ongoing tobacco use, discoid lupus, hypothyroidism, hyperlipidemia, right breast cancer referred by Dr. Burr Medico for enrollment into the Cardio-Oncology program.  She was diagnosed with R breast CA in 4/21. StageIA, pT1cN0M0,ER+/HER2+, PR-,Grade III. 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 Her margins were clear and she was node negative. Her left breast tissue was benign. Given her HER2 positive high grade disease she was started on weeklypaclitaxelandtrastuzumabfor 12 weeks on 10/28/19, followed bymaintenancetrastuzumabq3weeks   Denies any history of known heart disease. Had a stress test years ago (unsure why) which was OK. Has been strugglingwith left arm pain and numbness. Still smoking 1/2 ppd. .   She has been struggling with R breast wound post-op and saw Dr. Marla Roe. Wound has healed bus scar is still tender.    Here for f/u. Struggling wither her regimen a bit. Struggling with HAs and dizziness. BP ok. No palpitations, No edema. Still smoking some. No CP or undue sob. + night sweats  Echo today 05/26/20: EF 60-65% Grade 2 DD Personally reviewed  Echo 02/26/20: EF 60-65% Grade 1DD Personally reviewed Echo 8/21 EF 60-65% Personally reviewed Echo: 5/21 EF 65-70% GLS -20.4%   Reviewed personally.    Current Outpatient Medications  Medication Sig Dispense Refill  . acetaminophen (TYLENOL) 500 MG tablet Take 500-1,000 mg by mouth every 6 (six) hours as needed for moderate pain.     Marland Kitchen albuterol (VENTOLIN HFA) 108 (90 Base) MCG/ACT inhaler Inhale 2 puffs into the lungs every 6 (six) hours as needed for wheezing or shortness of breath. 8 g 2  .  ALPRAZolam (XANAX) 0.25 MG tablet Take 1 tablet (0.25 mg total) by mouth daily as needed for anxiety. 30 tablet 0  . baclofen (LIORESAL) 10 MG tablet Take 1 tablet (10 mg total) by mouth 2 (two) times daily as needed for muscle spasms. 30 each 1  . Dexlansoprazole (DEXILANT) 30 MG capsule Take 1 capsule (30 mg total) by mouth daily. 30 capsule 1  . fluticasone (CUTIVATE) 0.05 % cream     . fluticasone (FLONASE) 50 MCG/ACT nasal spray Place 2 sprays into both nostrils in the morning and at bedtime. 16 g 5  . fluticasone (FLOVENT HFA) 110 MCG/ACT inhaler Inhale 2 puffs into the lungs in the morning and at bedtime. 1 each 12  . levothyroxine (SYNTHROID) 75 MCG tablet Take 75 mcg by mouth daily before breakfast.    . lidocaine-prilocaine (EMLA) cream Apply 1 application topically as needed. 30 g 1  . LINZESS 72 MCG capsule TAKE 1 CAPSULE BY MOUTH DAILY BEFORE BREAKFAST. 30 capsule 3  . liothyronine (CYTOMEL) 5 MCG tablet TAKE 2 TABLETS (=10MCG     TOTAL)     DAILY 180 tablet 1  . pantoprazole (PROTONIX) 40 MG tablet Take 40 mg by mouth daily as needed.     No current facility-administered medications for this encounter.   Facility-Administered Medications Ordered in Other Encounters  Medication Dose Route Frequency Provider Last Rate Last Admin  . alum & mag hydroxide-simeth (MAALOX/MYLANTA) 200-200-20 MG/5ML suspension 30 mL  30 mL Oral Once Harle Stanford., PA-C        Allergies  Allergen Reactions  . Bactrim [Sulfamethoxazole-Trimethoprim] Itching  and Rash      Vitals:   05/26/20 1115  BP: 120/70  Pulse: 68  SpO2: 98%  Weight: 78.5 kg (173 lb)    PHYSICAL EXAM: General:  Well appearing. No resp difficulty HEENT: normal Neck: supple. no JVD. Carotids 2+ bilat; no bruits. No lymphadenopathy or thryomegaly appreciated. Cor: PMI nondisplaced. Regular rate & rhythm. No rubs, gallops or murmurs. Lungs: clear Abdomen: soft, nontender, nondistended. No hepatosplenomegaly. No bruits or  masses. Good bowel sounds. Extremities: no cyanosis, clubbing, rash, edema Neuro: alert & orientedx3, cranial nerves grossly intact. moves all 4 extremities w/o difficulty. Affect pleasant   ASSESSMENT & PLAN:  1. Right Breast Cancer - She was diagnosed with R breast CA in 06/2019. StageIA, pT1cN0M0,ER+/HER2+, PR-,Grade III.  - Echo 5/21 EF 65-70% GLS -20.4% - Echo 11/24/19 EF 60-65% - Echo 02/26/20: EF 60-65% Grade 1DD - Echo 05/26/20: EF 60-65% Grade 2 DD Personally reviewed - I reviewed echos personally. EF and Doppler parameters stable. No HF on exam. Continue Herceptin. Repeat echo in 3-4 months  2. Tobacco use - smokes ~0.5 ppd - Discussed cessation   3. Hyperlipidemia - LDL 142 in 12/21 - followed by PCP - Will need further CV risk stratification. Wild co coronary calcium scan   4. Dizziness - does not sound overtly cardiac but if persists can place Zio monitor for 14 days.   Glori Bickers, MD  11:12 PM

## 2020-05-26 ENCOUNTER — Ambulatory Visit (HOSPITAL_COMMUNITY)
Admission: RE | Admit: 2020-05-26 | Discharge: 2020-05-26 | Disposition: A | Discharge status: Home / Self Care | Payer: BC Managed Care – PPO | Source: Ambulatory Visit | Attending: Internal Medicine | Admitting: Internal Medicine

## 2020-05-26 ENCOUNTER — Other Ambulatory Visit: Payer: Self-pay

## 2020-05-26 ENCOUNTER — Encounter (HOSPITAL_COMMUNITY): Payer: Self-pay | Admitting: Internal Medicine

## 2020-05-26 ENCOUNTER — Ambulatory Visit (HOSPITAL_BASED_OUTPATIENT_CLINIC_OR_DEPARTMENT_OTHER)
Admission: RE | Admit: 2020-05-26 | Discharge: 2020-05-26 | Disposition: A | Discharge status: Home / Self Care | Payer: BC Managed Care – PPO | Source: Ambulatory Visit | Attending: Internal Medicine | Admitting: Internal Medicine

## 2020-05-26 VITALS — BP 120/70 | HR 68 | Wt 173.0 lb

## 2020-05-26 DIAGNOSIS — R42 Dizziness and giddiness: Secondary | ICD-10-CM | POA: Diagnosis not present

## 2020-05-26 DIAGNOSIS — Z170 Estrogen receptor positive status [ER+]: Secondary | ICD-10-CM

## 2020-05-26 DIAGNOSIS — C50411 Malignant neoplasm of upper-outer quadrant of right female breast: Secondary | ICD-10-CM | POA: Insufficient documentation

## 2020-05-26 DIAGNOSIS — E782 Mixed hyperlipidemia: Secondary | ICD-10-CM | POA: Diagnosis not present

## 2020-05-26 DIAGNOSIS — F17210 Nicotine dependence, cigarettes, uncomplicated: Secondary | ICD-10-CM | POA: Insufficient documentation

## 2020-05-26 DIAGNOSIS — Z720 Tobacco use: Secondary | ICD-10-CM

## 2020-05-26 LAB — ECHOCARDIOGRAM COMPLETE
Area-P 1/2: 3.99 cm2
S' Lateral: 3.1 cm

## 2020-05-26 NOTE — Patient Instructions (Addendum)
Labs done today. We will contact you only if your labs are abnormal.  No medication changes were made. Please continue all current medications as prescribed.  Your physician recommends that you schedule a follow-up appointment in: 3 months with an echo prior to your exam.  Your physician has requested that you have an echocardiogram. Echocardiography is a painless test that uses sound waves to create images of your heart. It provides your doctor with information about the size and shape of your heart and how well your heart's chambers and valves are working. This procedure takes approximately one hour. There are no restrictions for this procedure.  .inst  If you have any questions or concerns before your next appointment please send [place] a message through [place] or call our office at [phone].    TO LEAVE A MESSAGE FOR THE NURSE SELECT OPTION 2, PLEASE LEAVE A MESSAGE INCLUDING: . YOUR NAME . DATE OF BIRTH . CALL BACK NUMBER . REASON FOR CALL**this is important as we prioritize the call backs  YOU WILL RECEIVE A CALL BACK THE SAME DAY AS LONG AS YOU CALL BEFORE 4:00 PM   Do the following things EVERYDAY: 1) Weigh yourself in the morning before breakfast. Write it down and keep it in a log. 2) Take your medicines as prescribed 3) Eat low salt foods--Limit salt (sodium) to 2000 mg per day.  4) Stay as active as you can everyday 5) Limit all fluids for the day to less than 2 liters   At the [facility], you and your health needs are our priority. As part of our continuing mission to provide you with exceptional heart care, we have created designated Provider Care Teams. These Care Teams include your primary Cardiologist (physician) and Advanced Practice Providers (APPs- Physician Assistants and Nurse Practitioners) who all work together to provide you with the care you need, when you need it.   You may see any of the following providers on your designated Care Team at  your next follow up: Marland Kitchen Dr Glori Bickers . Dr Loralie Champagne . Darrick Grinder, NP . Lyda Jester, PA . Audry Riles, PharmD   Please be sure to bring in all your medications bottles to every appointment.

## 2020-05-26 NOTE — Addendum Note (Signed)
Encounter addended by: Shonna Chock, CMA on: 04/30/4008 12:32 PM  Actions taken: Order list changed

## 2020-05-26 NOTE — Progress Notes (Signed)
  Echocardiogram 2D Echocardiogram with 3D has been performed.  Stephanie Chase M 05/26/2020, 10:34 AM

## 2020-05-28 ENCOUNTER — Other Ambulatory Visit (HOSPITAL_COMMUNITY): Payer: Self-pay

## 2020-05-28 ENCOUNTER — Telehealth: Payer: Self-pay | Admitting: Radiology

## 2020-05-28 NOTE — Progress Notes (Signed)
error 

## 2020-05-28 NOTE — Telephone Encounter (Signed)
[id number]: Neuropathy Study  05/28/20     10:45AM  PHONE CALL: Confirmed I was speaking to [employer]. Informed patient call was pertaining to [id number] questionnaires. Patient stated she has not had an opportunity complete questionnaires, but would make herself a note to complete this weekend. This clinical research coordinator expressed understanding and thanked patient for her continued participation in the above mentioned study. Gave the option to patient to complete questionnaires this weekend and give them to the provider in [facility] on Monday if that would be easier on patient.   Carol Ada, RT(R)(T) Clinical Research Coordinator

## 2020-05-28 NOTE — Addendum Note (Signed)
Encounter addended by: Shonna Chock, CMA on: 10/28/2101 9:25 AM  Actions taken: Visit diagnoses modified, Order list changed, Diagnosis association updated, Pharmacy for encounter modified

## 2020-05-30 ENCOUNTER — Other Ambulatory Visit: Payer: BC Managed Care – PPO

## 2020-05-31 ENCOUNTER — Encounter: Payer: Self-pay | Admitting: Radiology

## 2020-05-31 ENCOUNTER — Inpatient Hospital Stay: Payer: BC Managed Care – PPO | Attending: Hematology

## 2020-05-31 ENCOUNTER — Inpatient Hospital Stay (HOSPITAL_BASED_OUTPATIENT_CLINIC_OR_DEPARTMENT_OTHER): Payer: BC Managed Care – PPO | Admitting: Hematology & Oncology

## 2020-05-31 ENCOUNTER — Other Ambulatory Visit: Payer: Self-pay

## 2020-05-31 ENCOUNTER — Inpatient Hospital Stay: Payer: BC Managed Care – PPO

## 2020-05-31 ENCOUNTER — Encounter: Payer: Self-pay | Admitting: Hematology & Oncology

## 2020-05-31 VITALS — BP 139/80 | HR 72 | Temp 98.6°F | Resp 20 | Wt 177.4 lb

## 2020-05-31 DIAGNOSIS — C50411 Malignant neoplasm of upper-outer quadrant of right female breast: Secondary | ICD-10-CM | POA: Diagnosis not present

## 2020-05-31 DIAGNOSIS — M810 Age-related osteoporosis without current pathological fracture: Secondary | ICD-10-CM | POA: Insufficient documentation

## 2020-05-31 DIAGNOSIS — Z170 Estrogen receptor positive status [ER+]: Secondary | ICD-10-CM

## 2020-05-31 DIAGNOSIS — Z79899 Other long term (current) drug therapy: Secondary | ICD-10-CM | POA: Diagnosis not present

## 2020-05-31 DIAGNOSIS — R339 Retention of urine, unspecified: Secondary | ICD-10-CM | POA: Insufficient documentation

## 2020-05-31 DIAGNOSIS — Z5112 Encounter for antineoplastic immunotherapy: Secondary | ICD-10-CM | POA: Insufficient documentation

## 2020-05-31 DIAGNOSIS — E038 Other specified hypothyroidism: Secondary | ICD-10-CM

## 2020-05-31 LAB — CMP (CANCER CENTER ONLY)
ALT: 15 U/L (ref 0–44)
AST: 22 U/L (ref 15–41)
Albumin: 4.4 g/dL (ref 3.5–5.0)
Alkaline Phosphatase: 89 U/L (ref 38–126)
Anion gap: 7 (ref 5–15)
BUN: 7 mg/dL (ref 6–20)
CO2: 26 mmol/L (ref 22–32)
Calcium: 10.5 mg/dL — ABNORMAL HIGH (ref 8.9–10.3)
Chloride: 101 mmol/L (ref 98–111)
Creatinine: 0.68 mg/dL (ref 0.44–1.00)
GFR, Estimated: >60 mL/min (ref >60)
Glucose, Bld: 95 mg/dL (ref 70–99)
Potassium: 3.9 mmol/L (ref 3.5–5.1)
Sodium: 134 mmol/L — ABNORMAL LOW (ref 135–145)
Total Bilirubin: 0.4 mg/dL (ref 0.3–1.2)
Total Protein: 7 g/dL (ref 6.5–8.1)

## 2020-05-31 LAB — CBC WITH DIFFERENTIAL (CANCER CENTER ONLY)
Abs Immature Granulocytes: 0.03 10*3/uL (ref 0.00–0.07)
Basophils Absolute: 0 10*3/uL (ref 0.0–0.1)
Basophils Relative: 0 %
Eosinophils Absolute: 0 10*3/uL (ref 0.0–0.5)
Eosinophils Relative: 0 %
HCT: 40.4 % (ref 36.0–46.0)
Hemoglobin: 13.5 g/dL (ref 12.0–15.0)
Immature Granulocytes: 0 %
Lymphocytes Relative: 18 %
Lymphs Abs: 1.6 10*3/uL (ref 0.7–4.0)
MCH: 27.8 pg (ref 26.0–34.0)
MCHC: 33.4 g/dL (ref 30.0–36.0)
MCV: 83.3 fL (ref 80.0–100.0)
Monocytes Absolute: 0.6 10*3/uL (ref 0.1–1.0)
Monocytes Relative: 7 %
Neutro Abs: 6.3 10*3/uL (ref 1.7–7.7)
Neutrophils Relative %: 75 %
Platelet Count: 261 10*3/uL (ref 150–400)
RBC: 4.85 MIL/uL (ref 3.87–5.11)
RDW: 13 % (ref 11.5–15.5)
WBC Count: 8.5 10*3/uL (ref 4.0–10.5)
nRBC: 0 % (ref 0.0–0.2)

## 2020-05-31 LAB — IRON AND TIBC
Iron: 72 ug/dL (ref 41–142)
Saturation Ratios: 22 % (ref 21–57)
TIBC: 321 ug/dL (ref 236–444)
UIBC: 250 ug/dL (ref 120–384)

## 2020-05-31 LAB — TSH: TSH: 0.328 u[IU]/mL (ref 0.308–3.960)

## 2020-05-31 LAB — FERRITIN: Ferritin: 67 ng/mL (ref 11–307)

## 2020-05-31 MED ORDER — HEPARIN SOD (PORK) LOCK FLUSH 100 UNIT/ML IV SOLN
500.0000 [IU] | Freq: Once | INTRAVENOUS | Status: AC | PRN
  Administered 2020-05-31: 500 [IU]
  Filled 2020-05-31: qty 5

## 2020-05-31 MED ORDER — SODIUM CHLORIDE 0.9% FLUSH
10.0000 mL | INTRAVENOUS | Status: DC | PRN
  Administered 2020-05-31: 10 mL
  Filled 2020-05-31: qty 10

## 2020-05-31 MED ORDER — DIPHENHYDRAMINE HCL 25 MG PO CAPS
25.0000 mg | ORAL_CAPSULE | Freq: Once | ORAL | Status: AC
  Administered 2020-05-31: 25 mg via ORAL

## 2020-05-31 MED ORDER — SODIUM CHLORIDE 0.9 % IV SOLN
450.0000 mg | Freq: Once | INTRAVENOUS | Status: AC
  Administered 2020-05-31: 450 mg via INTRAVENOUS
  Filled 2020-05-31: qty 21.43

## 2020-05-31 MED ORDER — ACETAMINOPHEN 325 MG PO TABS
650.0000 mg | ORAL_TABLET | Freq: Once | ORAL | Status: AC
  Administered 2020-05-31: 650 mg via ORAL

## 2020-05-31 MED ORDER — SODIUM CHLORIDE 0.9 % IV SOLN
Freq: Once | INTRAVENOUS | Status: AC
  Filled 2020-05-31: qty 250

## 2020-05-31 NOTE — Progress Notes (Signed)
Hematology and Oncology Follow Up Visit  Stephanie Chase [id number] 08-03-1960 60 y.o. 05/31/2020   Principle Diagnosis:   Stage IA (T1cN0M0) infiltrating ductal carcinoma of the RIGHT breast -- ER+/PR-/HER2+  Osteoporosis-secondary to chemotherapy  Current Therapy:    S/P bilateral mastectomy -- June 2021  S/p Taxol/Herceptin -- completed 12 weeks on 01/26/2020  Herceptin -- maintenance - start 02/10/2020   Prolia 60 mg subcu q. 6 months-first dose on 04/15/2020     Interim History:  Ms. Sawin is back for follow-up.  She still has problems with the fogginess.  We did do an MRI of the brain.  This really did not show anything that looked suspicious.  She still is worried about this lesion that was last seen on her bone scan.  I told her that the risk of cancer recurrence is probably can be less than 10%.  She had very early stage disease.  She does not have a lot of energy.  She fatigues quite easily.  She has had no problems with bowels or bladder.  She is having no problems with her mastectomy scar on the right anterior chest wall.  I think what she is feeling on the medial aspect of the scar is a suture under the skin.  She wants to have this out.  I told her that I think it would come out.  She will see Dr. Elisabeth Cara of Reconstructive Surgery for this.  She has had no fever.  There has been no bleeding.  She has had no nausea or vomiting.  We still do not have her on any antiestrogen.  She still is not ready to try to handle this.  Hopefully, we will be able to talk to her about this and try to get her on antiestrogen therapy which I think is helpful.  Overall, I would say performance status is ECOG 1.     Medications:  Current Outpatient Medications:  .  acetaminophen (TYLENOL) 500 MG tablet, Take 500-1,000 mg by mouth every 6 (six) hours as needed for moderate pain. , Disp: , Rfl:  .  albuterol (VENTOLIN HFA) 108 (90 Base) MCG/ACT inhaler, Inhale 2 puffs into the  lungs every 6 (six) hours as needed for wheezing or shortness of breath., Disp: 8 g, Rfl: 2 .  ALPRAZolam (XANAX) 0.25 MG tablet, Take 1 tablet (0.25 mg total) by mouth daily as needed for anxiety., Disp: 30 tablet, Rfl: 0 .  baclofen (LIORESAL) 10 MG tablet, Take 1 tablet (10 mg total) by mouth 2 (two) times daily as needed for muscle spasms., Disp: 30 each, Rfl: 1 .  Dexlansoprazole (DEXILANT) 30 MG capsule, Take 1 capsule (30 mg total) by mouth daily. (Patient taking differently: Take 30 mg by mouth daily as needed.), Disp: 30 capsule, Rfl: 1 .  fluticasone (FLONASE) 50 MCG/ACT nasal spray, Place 2 sprays into both nostrils in the morning and at bedtime., Disp: 16 g, Rfl: 5 .  levothyroxine (SYNTHROID) 75 MCG tablet, Take 75 mcg by mouth daily before breakfast., Disp: , Rfl:  .  lidocaine-prilocaine (EMLA) cream, Apply 1 application topically as needed., Disp: 30 g, Rfl: 1 .  liothyronine (CYTOMEL) 5 MCG tablet, TAKE 2 TABLETS (=10MCG     TOTAL)     DAILY, Disp: 180 tablet, Rfl: 1 .  fluticasone (CUTIVATE) 0.05 % cream, , Disp: , Rfl:  .  fluticasone (FLOVENT HFA) 110 MCG/ACT inhaler, Inhale 2 puffs into the lungs in the morning and at bedtime., Disp: 1 each, Rfl: 12 .  LINZESS 72 MCG capsule, TAKE 1 CAPSULE BY MOUTH DAILY BEFORE BREAKFAST. (Patient not taking: Reported on 05/31/2020), Disp: 30 capsule, Rfl: 3 .  pantoprazole (PROTONIX) 40 MG tablet, Take 40 mg by mouth daily as needed., Disp: , Rfl:  No current facility-administered medications for this visit.  Facility-Administered Medications Ordered in Other Visits:  .  alum & mag hydroxide-simeth (MAALOX/MYLANTA) 200-200-20 MG/5ML suspension 30 mL, 30 mL, Oral, Once, Tanner, Lyndon Code., PA-C  Allergies:  Allergies  Allergen Reactions  . Bactrim [Sulfamethoxazole-Trimethoprim] Itching and Rash    Past Medical History, Surgical history, Social history, and Family History were reviewed and updated.  Review of Systems: Review of Systems   Constitutional: Positive for fatigue.  HENT:   Positive for hearing loss and tinnitus.   Eyes: Positive for eye problems.  Respiratory: Positive for cough.   Cardiovascular: Negative.   Gastrointestinal: Positive for abdominal pain and nausea.  Endocrine: Negative.   Genitourinary: Negative.    Musculoskeletal: Positive for arthralgias and myalgias.  Skin: Negative.   Neurological: Positive for light-headedness.  Hematological: Negative.   Psychiatric/Behavioral: Negative.     Physical Exam:  weight is 177 lb 6.4 oz (80.5 kg). Her oral temperature is 98.6 F (37 C). Her blood pressure is 139/80 and her pulse is 72. Her respiration is 20 and oxygen saturation is 96%.   Wt Readings from Last 3 Encounters:  05/31/20 177 lb 6.4 oz (80.5 kg)  05/26/20 173 lb (78.5 kg)  05/13/20 170 lb 6.4 oz (77.3 kg)    Physical Exam Vitals reviewed.  Constitutional:      Comments: She has had bilateral mastectomies.  Mastectomy scars are well-healed.  There is no erythema or warmth or swelling associated with these.  I cannot palpate any bilateral axillary lymph nodes.  HENT:     Head: Normocephalic and atraumatic.  Eyes:     Pupils: Pupils are equal, round, and reactive to light.  Cardiovascular:     Rate and Rhythm: Normal rate and regular rhythm.     Heart sounds: Normal heart sounds.  Pulmonary:     Effort: Pulmonary effort is normal.     Breath sounds: Normal breath sounds.  Abdominal:     General: Bowel sounds are normal.     Palpations: Abdomen is soft.  Musculoskeletal:        General: No tenderness or deformity. Normal range of motion.     Cervical back: Normal range of motion.  Lymphadenopathy:     Cervical: No cervical adenopathy.  Skin:    General: Skin is warm and dry.     Findings: No erythema or rash.  Neurological:     Mental Status: She is alert and oriented to person, place, and time.  Psychiatric:        Behavior: Behavior normal.        Thought Content: Thought  content normal.        Judgment: Judgment normal.    Lab Results  Component Value Date   WBC 8.5 05/31/2020   HGB 13.5 05/31/2020   HCT 40.4 05/31/2020   MCV 83.3 05/31/2020   PLT 261 05/31/2020     Chemistry      Component Value Date/Time   NA 134 (L) 05/31/2020 0953   K 3.9 05/31/2020 0953   CL 101 05/31/2020 0953   CO2 26 05/31/2020 0953   BUN 7 05/31/2020 0953   CREATININE 0.68 05/31/2020 0953   CREATININE 0.66 09/12/2017 0747      Component  Value Date/Time   CALCIUM 10.5 (H) 05/31/2020 0953   ALKPHOS 89 05/31/2020 0953   AST 22 05/31/2020 0953   ALT 15 05/31/2020 0953   BILITOT 0.4 05/31/2020 0953      Impression and Plan: Ms. Radu is is a very nice 60 year old postmenopausal female.  She is originally from [place].  She moved down to [facility].  She has 2 grandchildren already.  Her children live on the [place].  She has early stage breast cancer.  Unfortunately, it is HER-2 positive.  Because this, she required chemotherapy with Herceptin.  We will go ahead with the Herceptin today.  We will go ahead and order another bone scan on her.  Hopefully this will give her some peace of mind regarding the last lesion that she has seen.  Again I told her that I just did not think this was cancerous.  I really do not think it has to be biopsied.  However, this ultimately may need to happen.  Hopefully, she will eventually improve.  I just like to see her performance status and quality of life get better.  We will plan for another follow-up in 3 weeks.    [name], MD 3/7/202211:20 AM

## 2020-05-31 NOTE — Patient Instructions (Signed)

## 2020-05-31 NOTE — Patient Instructions (Signed)
Trastuzumab injection for infusion What is this medicine? TRASTUZUMAB (tras TOO zoo mab) is a monoclonal antibody. It is used to treat breast cancer and stomach cancer. This medicine may be used for other purposes; ask your health care provider or pharmacist if you have questions. COMMON BRAND NAME(S): Herceptin, Herzuma, KANJINTI, Ogivri, Ontruzant, Trazimera What should I tell my health care provider before I take this medicine? They need to know if you have any of these conditions:  heart disease  heart failure  lung or breathing disease, like asthma  an unusual or allergic reaction to trastuzumab, benzyl alcohol, or other medications, foods, dyes, or preservatives  pregnant or trying to get pregnant  breast-feeding How should I use this medicine? This drug is given as an infusion into a vein. It is administered in a hospital or clinic by a specially trained health care professional. Talk to your pediatrician regarding the use of this medicine in children. This medicine is not approved for use in children. Overdosage: If you think you have taken too much of this medicine contact a poison control center or emergency room at once. NOTE: This medicine is only for you. Do not share this medicine with others. What if I miss a dose? It is important not to miss a dose. Call your doctor or health care professional if you are unable to keep an appointment. What may interact with this medicine? This medicine may interact with the following medications:  certain types of chemotherapy, such as daunorubicin, doxorubicin, epirubicin, and idarubicin This list may not describe all possible interactions. Give your health care provider a list of all the medicines, herbs, non-prescription drugs, or dietary supplements you use. Also tell them if you smoke, drink alcohol, or use illegal drugs. Some items may interact with your medicine. What should I watch for while using this medicine? Visit your  doctor for checks on your progress. Report any side effects. Continue your course of treatment even though you feel ill unless your doctor tells you to stop. Call your doctor or health care professional for advice if you get a fever, chills or sore throat, or other symptoms of a cold or flu. Do not treat yourself. Try to avoid being around people who are sick. You may experience fever, chills and shaking during your first infusion. These effects are usually mild and can be treated with other medicines. Report any side effects during the infusion to your health care professional. Fever and chills usually do not happen with later infusions. Do not become pregnant while taking this medicine or for 7 months after stopping it. Women should inform their doctor if they wish to become pregnant or think they might be pregnant. Women of child-bearing potential will need to have a negative pregnancy test before starting this medicine. There is a potential for serious side effects to an unborn child. Talk to your health care professional or pharmacist for more information. Do not breast-feed an infant while taking this medicine or for 7 months after stopping it. Women must use effective birth control with this medicine. What side effects may I notice from receiving this medicine? Side effects that you should report to your doctor or health care professional as soon as possible:  allergic reactions like skin rash, itching or hives, swelling of the face, lips, or tongue  chest pain or palpitations  cough  dizziness  feeling faint or lightheaded, falls  fever  general ill feeling or flu-like symptoms  signs of worsening heart failure like   breathing problems; swelling in your legs and feet  unusually weak or tired Side effects that usually do not require medical attention (report to your doctor or health care professional if they continue or are bothersome):  bone pain  changes in  taste  diarrhea  joint pain  nausea/vomiting  weight loss This list may not describe all possible side effects. Call your doctor for medical advice about side effects. You may report side effects to FDA at 1-800-FDA-1088. Where should I keep my medicine? This drug is given in a hospital or clinic and will not be stored at home. NOTE: This sheet is a summary. It may not cover all possible information. If you have questions about this medicine, talk to your doctor, pharmacist, or health care provider.  2020 Elsevier/Gold Standard (2016-03-07 14:37:52)  

## 2020-05-31 NOTE — Progress Notes (Signed)
X8333: Neuropathy Study  05/31/20     11:30AM  OFF-STUDY: Reached out to Ewing Schlein, RN at [facility] to check with Bobbye Morton about questionnaires for the above mentioned study. Per this conversation, patient prefers to no longer participate. Off-protocol documentation was completed in [facility].   Carol Ada, RT(R)(T) Clinical Research Coordinator   06/02/20   9:05AM  PHONE CALL: Left v/m for patient to confirm she is no longer interested in participating. Expressed my understanding and thanked patient for her time.

## 2020-06-01 ENCOUNTER — Telehealth: Payer: Self-pay | Admitting: Plastic Surgery

## 2020-06-01 ENCOUNTER — Encounter: Payer: Self-pay | Admitting: Hematology & Oncology

## 2020-06-01 ENCOUNTER — Telehealth: Payer: Self-pay | Admitting: Hematology & Oncology

## 2020-06-01 NOTE — Telephone Encounter (Addendum)
Returned patients call. She had excision of right breast wound with Dr. Marla Roe on 10/22/19. She removed sutures on 11/18/19 and advise patient, the rest will dissolve on their own. Her right breast is currently still tender from her previous surgery. Patient denied any fevers, nausea, vomiting, nor diarrhea. I indicated it could possibly be scar tissue, but will need to set up an appointment for further observation. Front desk will give her a call to set up an appointment with Catalina Antigua first available. Patient understood and agreed.

## 2020-06-01 NOTE — Telephone Encounter (Signed)
Appointments scheduled per 3/7 los letter/calendar mailed  

## 2020-06-01 NOTE — Telephone Encounter (Signed)
Patient said that she has one area around the incision of her right breast that is still tender and it's getting worse. Her oncologist said that it feels like it may still be a stitch underneath the skin. Patient isn't sure if she should come in or if anything can be done. Please call to advise. [phone]

## 2020-06-02 ENCOUNTER — Ambulatory Visit (INDEPENDENT_AMBULATORY_CARE_PROVIDER_SITE_OTHER): Payer: BC Managed Care – PPO | Admitting: Neurology

## 2020-06-02 DIAGNOSIS — R202 Paresthesia of skin: Secondary | ICD-10-CM

## 2020-06-02 DIAGNOSIS — G8929 Other chronic pain: Secondary | ICD-10-CM

## 2020-06-02 DIAGNOSIS — M5442 Lumbago with sciatica, left side: Secondary | ICD-10-CM | POA: Insufficient documentation

## 2020-06-02 DIAGNOSIS — M542 Cervicalgia: Secondary | ICD-10-CM | POA: Diagnosis not present

## 2020-06-02 DIAGNOSIS — C50911 Malignant neoplasm of unspecified site of right female breast: Secondary | ICD-10-CM | POA: Diagnosis not present

## 2020-06-02 HISTORY — DX: Cervicalgia: M54.2

## 2020-06-02 HISTORY — DX: Other chronic pain: G89.29

## 2020-06-02 MED ORDER — DULOXETINE HCL 20 MG PO CPEP: 20.0000 mg | ORAL_CAPSULE | Freq: Every day | ORAL | 6 refills | Status: DC

## 2020-06-02 NOTE — Progress Notes (Signed)
No chief complaint on file.   HISTORICAL  Stephanie Chase is a 60 year old right-handed female, seen in request by her primary care physician Dr. Theda Sers, [place], for evaluation of left arm and foot paresthesia, initial evaluation was on May 13, 2020  I reviewed and summarized the referring note.PMHx. Hypothyroidism, on supplemnt Right breast cancer October 05 2019, her-2 positive, oncologist is Dr.Ennever, consider start her on chemotherapy with herceptin. 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, only received cream as needed  She began to notice gradual onset of left hand symptoms in September 2021, gradual onset involving all 5 fingers on her left hand, she also noticed tension at left shoulder blade, left elbow discomfort, also noticed intermittent left foot paresthesia, left low back pain, lasting for few days, sometimes radiating to hip  In addition, she complains a lot of stress, complains frequent headaches, holoacranial headache with light noise sensitivity, dizziness, lasting for few days, she has to hold on to keep her balance sometimes, also complains of brain fog,  MRI of brain with without contrast is ordered by her oncologist  UPDATE June 02 2020: She complains of intermittent neck pain, low back pain, more to the left shoulder, occasionally radiating pain to left leg, Tylenol as needed was helpful sometimes, muscle relaxant usually put her into sleep  She also complains of anxiety, taking Xanax as needed for panic attack, complains of stress,  Personally reviewed MRI of the brain with without contrast on May 20, 2020: No significant abnormality  EMG nerve conduction study today is normal REVIEW OF SYSTEMS: Full 14 system review of systems performed and notable only for as above All other review of systems were negative.  ALLERGIES: Allergies  Allergen Reactions  . Bactrim [Sulfamethoxazole-Trimethoprim] Itching and Rash    HOME MEDICATIONS: Current  Outpatient Medications  Medication Sig Dispense Refill  . acetaminophen (TYLENOL) 500 MG tablet Take 500-1,000 mg by mouth every 6 (six) hours as needed for moderate pain.     Marland Kitchen albuterol (VENTOLIN HFA) 108 (90 Base) MCG/ACT inhaler Inhale 2 puffs into the lungs every 6 (six) hours as needed for wheezing or shortness of breath. 8 g 2  . ALPRAZolam (XANAX) 0.25 MG tablet Take 1 tablet (0.25 mg total) by mouth daily as needed for anxiety. 30 tablet 0  . baclofen (LIORESAL) 10 MG tablet Take 1 tablet (10 mg total) by mouth 2 (two) times daily as needed for muscle spasms. 30 each 1  . Dexlansoprazole (DEXILANT) 30 MG capsule Take 1 capsule (30 mg total) by mouth daily. (Patient taking differently: Take 30 mg by mouth daily as needed.) 30 capsule 1  . fluticasone (CUTIVATE) 0.05 % cream     . fluticasone (FLONASE) 50 MCG/ACT nasal spray Place 2 sprays into both nostrils in the morning and at bedtime. 16 g 5  . fluticasone (FLOVENT HFA) 110 MCG/ACT inhaler Inhale 2 puffs into the lungs in the morning and at bedtime. 1 each 12  . levothyroxine (SYNTHROID) 75 MCG tablet Take 75 mcg by mouth daily before breakfast.    . lidocaine-prilocaine (EMLA) cream Apply 1 application topically as needed. 30 g 1  . LINZESS 72 MCG capsule TAKE 1 CAPSULE BY MOUTH DAILY BEFORE BREAKFAST. (Patient not taking: Reported on 05/31/2020) 30 capsule 3  . liothyronine (CYTOMEL) 5 MCG tablet TAKE 2 TABLETS (=10MCG     TOTAL)     DAILY 180 tablet 1  . pantoprazole (PROTONIX) 40 MG tablet Take 40 mg by mouth daily as needed.  No current facility-administered medications for this visit.   Facility-Administered Medications Ordered in Other Visits  Medication Dose Route Frequency Provider Last Rate Last Admin  . alum & mag hydroxide-simeth (MAALOX/MYLANTA) 200-200-20 MG/5ML suspension 30 mL  30 mL Oral Once Harle Stanford., PA-C        PAST MEDICAL HISTORY: Past Medical History:  Diagnosis Date  . Anemia   . Anxiety   .  Asthma   . Breast cancer ([place])    right, 05/13/20 completed Taxol, on Herceptin  . Cervical paraspinous muscle spasm   . COPD (chronic obstructive pulmonary disease) (H[facility])    emphysema  . Discoid lupus   . Discoid lupus   . Family history of bladder cancer   . Family history of BRCA gene mutation   . Family history of breast cancer   . Family history of prostate cancer   . GERD (gastroesophageal reflux disease)   . Hypercholesteremia   . Hypothyroidism   . Neuropathy   . Pneumonia   . Thyroid disease     PAST SURGICAL HISTORY: Past Surgical History:  Procedure Laterality Date  . ABLATION ON ENDOMETRIOSIS  2016  . APPLICATION OF A-CELL OF EXTREMITY Right 10/22/2019   Procedure: EXCISION OF RIGHT BREAST WOUND WITH PRIMARY CLOSURE;  Surgeon: [name] Going, DO;  Location: [facility];  Service: Plastics;  Laterality: Right;  . BREAST LUMPECTOMY    . DEBRIDEMENT AND CLOSURE WOUND Right 10/22/2019   Procedure: Excision of right breast wound;  Surgeon: [name] Going, DO;  Location: [facility];  Service: Plastics;  Laterality: Right;  . MASTECTOMY W/ SENTINEL NODE BIOPSY Bilateral 09/05/2019   Procedure: BILATERAL MASTECTOMY WITH RIGHT SENTINEL LYMPH NODE BIOPSY;  Surgeon: Stark Klein, MD;  Location: [facility];  Service: General;  Laterality: Bilateral;  COMBINED WITH REGIONAL FOR POST OP PAIN  . PORTACATH PLACEMENT Left 09/05/2019   Procedure: INSERTION PORT-A-CATH WITH ULTRASOUND GUIDANCE;  Surgeon: Stark Klein, MD;  Location: [facility];  Service: General;  Laterality: Left;  . TONSILLECTOMY    . TUBAL LIGATION    . WISDOM TOOTH EXTRACTION      FAMILY HISTORY: Family History  Problem Relation Age of Onset  . Breast cancer Mother 47  . Diabetes Mother   . Bladder Cancer Mother 61       ureteral cancer  . Cancer Mother        blood cancer  . Heart attack Father   . Hyperlipidemia Father   . Hypertension Father   . Alzheimer's disease Father   . Prostate cancer Father        dx.  >50  . Alzheimer's disease Other   . Cancer Maternal Uncle        prostate or colon  . BRCA 1/2 Daughter   . Breast cancer Other 45       maternal great-aunt  . Cancer Cousin        unknown type; dx. in her 51s (maternal cousin)    SOCIAL HISTORY: Social History   Socioeconomic History  . Marital status: Divorced    Spouse name: Not on file  . Number of children: 2  . Years of education: Not on file  . Highest education level: Some college, no degree  Occupational History  . Not on file  Tobacco Use  . Smoking status: Current Every Day Smoker    Packs/day: 0.50    Years: 50.00    Pack years: 25.00    Types: Cigarettes  Start date: 75  . Smokeless tobacco: Never Used  Vaping Use  . Vaping Use: Never used  Substance and Sexual Activity  . Alcohol use: Yes    Comment: once a month  . Drug use: No  . Sexual activity: Yes    Partners: Male    Birth control/protection: Surgical    Comment: BTL and ablation  Other Topics Concern  . Not on file  Social History Narrative  . Not on file   Social Determinants of Health   Financial Resource Strain: Not on file  Food Insecurity: Not on file  Transportation Needs: Not on file  Physical Activity: Not on file  Stress: Not on file  Social Connections: Not on file  Intimate Partner Violence: Not on file     PHYSICAL EXAM   There were no vitals filed for this visit. Not recorded     There is no height or weight on file to calculate BMI.  PHYSICAL EXAMNIATION:  Gen: NAD, conversant, well nourised, well groomed          Muscular skeleton: Tenderness upon deep palpation along bilateral nuchal line, left lower lumbar paraspinal upon deep palpitation,  NEUROLOGICAL EXAM:  MENTAL STATUS: Speech/cognition: Awake, alert, oriented to history taking and casual conversation  CRANIAL NERVES: CN II: Visual fields are full to confrontation. Pupils are round equal and briskly reactive to light. CN III, IV, VI:  extraocular movement are normal. No ptosis. CN V: Facial sensation is intact to light touch CN VII: Face is symmetric with normal eye closure  CN VIII: Hearing is normal to causal conversation. CN IX, X: Phonation is normal. CN XI: Head turning and shoulder shrug are intact  MOTOR: There is no pronator drift of out-stretched arms. Muscle bulk and tone are normal. Muscle strength is normal.  REFLEXES: Reflexes are 2+ and symmetric at the biceps, triceps, knees, and ankles. Plantar responses are flexor.  SENSORY: Intact to light touch, pinprick and vibratory sensation are intact in fingers and toes.  COORDINATION: There is no trunk or limb dysmetria noted.  GAIT/STANCE: Normal gait   DIAGNOSTIC DATA (LABS, IMAGING, TESTING) - I reviewed patient records, labs, notes, testing and imaging myself where available.   ASSESSMENT AND PLAN  Stephanie Chase is a 60 y.o. female   History of right breast cancer, status post right lobectomy Left hand paresthesia, neck pain, low back pain Anxiety  Normal EMG nerve conduction study, no evidence of left upper or lower extremity neuropathy, left cervical/lumbosacral radiculopathy  MRI of the brain with without contrast was normal  Most consistent with her anxiety, musculoskeletal etiology  Will try low-dose Cymbalta 20 mg daily  Also encouraged her warm compression, neck/low back stretching exercise, as needed NSAIDs, Tylenol    Marcial Pacas, M.D. Ph.D.  [facility] [address] [place], [place] [address] Ph: [phone] Fax: [phone]  CC:  Janie Morning, [facility] [place],  [place] [address]  Delsa Bern, MD

## 2020-06-02 NOTE — Procedures (Signed)
Full Name: Stephanie Chase Gender: Female MRN #: [id number] Date of Birth: Mar 03, 1961    Visit Date: 06/02/2020 09:44 Age: 60 Years Examining Physician: Marcial Pacas, MD  Referring Physician: Marcial Pacas, MD History: 60 year old female, complains of left shoulder, low back pain, paresthesia  Summary of the test: Nerve conduction study: Left sural, superficial peroneal, median, ulnar, and mixed responses were normal  Left tibial, peroneal to EDB, median and ulnar motor responses were normal.  Electromyography: Selected needle examinations of left upper, lower extremity muscles, left cervical and lumbar paraspinal muscles were normal.  Conclusion: This is a normal study.  There is no electrodiagnostic evidence of left upper or lower extremity neuropathy, there is no evidence of left cervical or lumbosacral radiculopathy.    ------------------------------- Marcial Pacas M.D. PhD  [facility] [address] [place], [place] [address] Tel: [phone] Fax: [phone]  Verbal informed consent was obtained from the patient, patient was informed of potential risk of procedure, including bruising, bleeding, hematoma formation, infection, muscle weakness, muscle pain, numbness, among others.        [place]    Nerve / Sites Muscle Latency Ref. Amplitude Ref. Rel Amp Segments Distance Velocity Ref. Area    ms ms mV mV %  cm m/s m/s mVms  L Median - APB     Wrist APB 3.5 ?4.4 8.6 ?4.0 100 Wrist - APB 7   36.1     Upper arm APB 7.2  8.8  102 Upper arm - Wrist 20 54 ?49 35.9  L Ulnar - ADM     Wrist ADM 3.1 ?3.3 12.4 ?6.0 100 Wrist - ADM 7   53.3     B.Elbow ADM 6.3  11.4  91.3 B.Elbow - Wrist 18 56 ?49 51.7     A.Elbow ADM 8.1  11.4  100 A.Elbow - B.Elbow 10 55 ?49 52.7  L Peroneal - EDB     Ankle EDB 3.9 ?6.5 6.1 ?2.0 100 Ankle - EDB 9   20.0     Fib head EDB 9.4  5.1  83.4 Fib head - Ankle 26 47 ?44 17.2     Pop fossa EDB 11.6  5.1  100 Pop fossa - Fib  head 10 46 ?44 17.9         Pop fossa - Ankle      L Tibial - AH     Ankle AH 3.5 ?5.8 5.6 ?4.0 100 Ankle - AH 9   10.3     Pop fossa AH 11.9  3.7  66.7 Pop fossa - Ankle 35 42 ?41 11.3             SNC    Nerve / Sites Rec. Site Peak Lat Ref.  Amp Ref. Segments Distance Peak Diff Ref.    ms ms V V  cm ms ms  L Sural - Ankle (Calf)     Calf Ankle 3.9 ?4.4 14 ?6 Calf - Ankle 14    L Superficial peroneal - Ankle     Lat leg Ankle 3.6 ?4.4 7 ?6 Lat leg - Ankle 14    L Median, Ulnar - Transcarpal comparison     Median Palm Wrist 2.0 ?2.2 77 ?35 Median Palm - Wrist 8       Ulnar Palm Wrist 1.9 ?2.2 28 ?12 Ulnar Palm - Wrist 8          Median Palm - Ulnar Palm  0.1 ?0.4  L  Median - Orthodromic (Dig II, Mid palm)     Dig II Wrist 3.0 ?3.4 14 ?10 Dig II - Wrist 13    L Ulnar - Orthodromic, (Dig V, Mid palm)     Dig V Wrist 2.4 ?3.1 11 ?5 Dig V - Wrist 58                 F  Wave    Nerve F Lat Ref.   ms ms  L Tibial - AH 48.8 ?56.0  L Ulnar - ADM 26.5 ?32.0         EMG Summary Table    Spontaneous MUAP Recruitment  Muscle IA Fib PSW Fasc Other Amp Dur. Poly Pattern  L. First dorsal interosseous Normal None None None _______ Normal Normal Normal Normal  L. Pronator teres Normal None None None _______ Normal Normal Normal Normal  L. Biceps brachii Normal None None None _______ Normal Normal Normal Normal  L. Deltoid Normal None None None _______ Normal Normal Normal Normal  L. Triceps brachii Normal None None None _______ Normal Normal Normal Normal  L. Brachioradialis Normal None None None _______ Normal Normal Normal Normal  L. Tibialis anterior Normal None None None _______ Normal Normal Normal Normal  L. Tibialis posterior Normal None None None _______ Normal Normal Normal Normal  L. Peroneus longus Normal None None None _______ Normal Normal Normal Normal  L. Gastrocnemius (Medial head) Normal None None None _______ Normal Normal Normal Normal  L. Vastus lateralis Normal None  None None _______ Normal Normal Normal Normal  L. Cervical paraspinals (low) Normal None None None _______ Normal Normal Normal Normal  L. Cervical paraspinals (mid) Normal None None None _______ Normal Normal Normal Normal

## 2020-06-05 LAB — ESTRADIOL, ULTRA SENS: Estradiol, Sensitive: 3.8 pg/mL

## 2020-06-11 ENCOUNTER — Telehealth: Payer: Self-pay | Admitting: *Deleted

## 2020-06-11 NOTE — Telephone Encounter (Signed)
Called [facility] and while on hold the CSR advised Trazimera 652mg  has been denied due to " Dose is more than the recommendations and indications."  Message forward to MD for Review.

## 2020-06-14 ENCOUNTER — Other Ambulatory Visit: Payer: Self-pay

## 2020-06-14 ENCOUNTER — Ambulatory Visit (HOSPITAL_COMMUNITY)
Admission: RE | Admit: 2020-06-14 | Discharge: 2020-06-14 | Disposition: A | Discharge status: Home / Self Care | Payer: BC Managed Care – PPO | Source: Ambulatory Visit | Attending: Hematology & Oncology | Admitting: Hematology & Oncology

## 2020-06-14 ENCOUNTER — Encounter (HOSPITAL_COMMUNITY)
Admission: RE | Admit: 2020-06-14 | Discharge: 2020-06-14 | Disposition: A | Discharge status: Home / Self Care | Payer: BC Managed Care – PPO | Source: Ambulatory Visit | Attending: Hematology & Oncology | Admitting: Hematology & Oncology

## 2020-06-14 DIAGNOSIS — Z170 Estrogen receptor positive status [ER+]: Secondary | ICD-10-CM | POA: Insufficient documentation

## 2020-06-14 DIAGNOSIS — C50411 Malignant neoplasm of upper-outer quadrant of right female breast: Secondary | ICD-10-CM

## 2020-06-14 IMAGING — NM NM BONE WHOLE BODY
2 series · 2 of 2 positions shown · non-contrast
Comparison: [DATE]

CLINICAL DATA: Breast cancer surveillance. History of right breast
cancer. Patient complains of lumbar, left hip, and bilateral knee
pain.

EXAM:
NUCLEAR MEDICINE WHOLE BODY BONE SCAN
TECHNIQUE: Whole body anterior and posterior images were obtained approximately
3 hours after intravenous injection of radiopharmaceutical.
RADIOPHARMACEUTICALS:  20.4 mCi [SY] MDP IV

[Series 1: whole body · 2.66mm/px · 1 of 1 slices shown (1 of 2)]
[im 1/1]
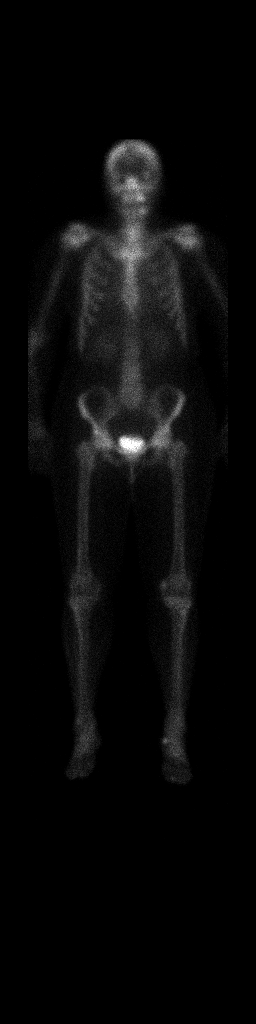

[Series 1: whole body · 2.66mm/px · 1 of 1 slices shown (2 of 2)]
[im 1/1]
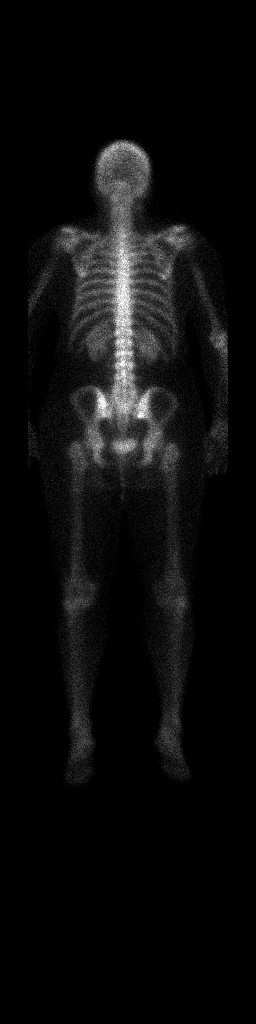

[2 of 2 positions shown; findings below may reference images not displayed]

FINDINGS: There is a single focus of mildly increased radiotracer uptake
localizing to the distal portion of the right sacral wing. The
appearance is similar to [DATE].

The previously noted questionable area of abnormal uptake localizing
to the posterior right fourth rib is less conspicuous on today's
study.

No signs of multifocal abnormal areas of increased uptake to suggest
diffuse metastatic disease.

Physiologic tracer activity is seen within both kidneys an urinary
bladder.
IMPRESSION: No highly suspicious findings to suggest osseous metastatic disease.

Stable, equivocal focus of increased uptake within the distal aspect
of the right sacral wing, which is likely degenerative in etiology.
Metastatic disease is considered less favored. Suggest correlation
with plain film radiographs of the sacrum.

## 2020-06-14 MED ORDER — TECHNETIUM TC 99M MEDRONATE IV KIT
20.4000 | PACK | Freq: Once | INTRAVENOUS | Status: AC | PRN
  Administered 2020-06-14: 20.4 via INTRAVENOUS

## 2020-06-16 ENCOUNTER — Encounter: Payer: Self-pay | Admitting: Surgical

## 2020-06-16 ENCOUNTER — Other Ambulatory Visit: Payer: Self-pay

## 2020-06-16 ENCOUNTER — Ambulatory Visit (INDEPENDENT_AMBULATORY_CARE_PROVIDER_SITE_OTHER): Payer: BC Managed Care – PPO | Admitting: Surgical

## 2020-06-16 VITALS — BP 123/87 | HR 81 | Ht 65.0 in | Wt 170.0 lb

## 2020-06-16 DIAGNOSIS — C50411 Malignant neoplasm of upper-outer quadrant of right female breast: Secondary | ICD-10-CM | POA: Diagnosis not present

## 2020-06-16 DIAGNOSIS — Z9013 Acquired absence of bilateral breasts and nipples: Secondary | ICD-10-CM | POA: Diagnosis not present

## 2020-06-16 MED ORDER — DICLOFENAC SODIUM 1 % EX GEL: 2.0000 g | Freq: Four times a day (QID) | CUTANEOUS | 0 refills | Status: DC

## 2020-06-16 NOTE — Progress Notes (Signed)
Referring Provider Delsa Bern, MD [address] [place],  [place] [address]   CC:  Chief Complaint  Patient presents with  . Follow-up      Stephanie Chase is an 60 y.o. female.  HPI: Patient is a 60 year old female here for evaluation of her breasts.  She initially underwent bilateral mastectomies on 09/05/2019 with general surgery at [facility].  She subsequently developed a right breast wound and return to the operating room on 10/22/2019 with Dr. Marla Roe for excision of the right breast wound.  She has healed very nicely.  She does report that she has had persistent pain, she reports that she specifically notes pain along the right medial breast.  But she does report that she is having full body pain and reports that she has discussed this with her PCP and oncologic team.  She reports that she is also seeing a holistic medicine doctor for this.  She reports that she has previously completed physical therapy at the breast center.  She is not having any infectious symptoms.  She reports that she is having difficulty wearing her prosthesis due to the pressure causing pain along her right medial breast.  She is very bothered by this as it is affecting her daily life and she is unable to wear her prosthesis.  She also reports that her muscles feel tight every morning, it improves throughout the day with stretching, but reports every morning she has having the tightness.  Review of Systems General: No fevers, chills  Physical Exam Vitals with BMI 06/16/2020 05/31/2020 05/26/2020  Height 5\' 5"  - -  Weight 170 lbs 177 lbs 6 oz 173 lbs  BMI 19.14 78.29 -  Systolic 562 130 865  Diastolic 87 80 70  Pulse 81 72 68  Chaperone present on Exam General:  No acute distress,  Alert and oriented, Non-Toxic, Normal speech and affect Bilateral mastectomy incisions are well-healed, no areas of erythema or cellulitic changes are noted.  No areas of significant scarring noted.  No thickened  or firm areas palpated.  In the area of specific tenderness I do not feel any abnormally thickened tissue.  I do not palpate any fluctuance.  There is no overlying skin changes noted.  No crepitus noted.  No fluid wave or subcutaneous fluid collections are noted.  Bilateral breast incisions with some excess tissue noted medially.  Assessment/Plan  Patient is a 60 year old female status post bilateral mastectomies by general surgery 9 months ago in June 2021.  She returned to the operating room on 10/22/2019 with Dr. Marla Roe for excision of her right breast wound and primary closure.  This is healed very nicely.  She subsequently completed chemotherapy.  She reports that she has been having full body aches for quite some time now and specifically notes to me that she is here for evaluation of her right medial breast pain.  She reports constant aching and it is worse with compression or pressure when wearing her breast prosthesis.  On exam I do not feel any areas of concern, I do not feel any subcutaneous fluid collection or thickened fibrous scar tissue.  The area is not adherent to the chest wall.  I discussed with Ms. Delaine that it is possible there is some scar tissue causing the tenderness.  She questioned if it could be a suture that was still present.  I discussed with her that we do not use any sutures that are not absorbable so it would be unlikely, however  some individuals may have prolonged absorption time for dissolvable sutures and it could be possible.  I did also discuss with her that she may benefit from physical therapy for improvement in her muscle tightness.  I recommend she discuss her full body pain with her PCP or oncologic team.  In regards to her right breast tenderness I would recommend massaging the area of concern.  If it is scar tissue or a suture that is still present this will help break up any adhesions.  I also recommend that she try over-the-counter pain medications like  Tylenol and ibuprofen.  I discussed with her that we could also try a topical medication to help decrease pain.  She is willing to try the topical medication, but would like to hold off on any physical therapy at this point.  I discussed with her that if she does not notice an improvement over the next month or so we can potentially do an ultrasound to further evaluate, however I do not think that this is necessary at this time.  I recommend she call us with any questions or concerns, I did offer to schedule a virtual follow-up or an in person follow-up in approximately 1 month for further discussion, however she would like to wait and see how things go and she reported that she would call us if she would like to schedule.  Pictures were obtained of the patient and placed in the chart with the patient's or guardian's permission.   Carola Rhine Boniface Goffe 06/16/2020, 3:38 PM

## 2020-06-17 ENCOUNTER — Encounter: Payer: Self-pay | Admitting: *Deleted

## 2020-06-21 ENCOUNTER — Other Ambulatory Visit: Payer: Self-pay

## 2020-06-21 ENCOUNTER — Encounter: Payer: Self-pay | Admitting: Hematology & Oncology

## 2020-06-21 ENCOUNTER — Inpatient Hospital Stay: Payer: BC Managed Care – PPO

## 2020-06-21 ENCOUNTER — Other Ambulatory Visit: Payer: Self-pay | Admitting: Nurse Practitioner

## 2020-06-21 ENCOUNTER — Inpatient Hospital Stay (HOSPITAL_BASED_OUTPATIENT_CLINIC_OR_DEPARTMENT_OTHER): Payer: BC Managed Care – PPO | Admitting: Hematology & Oncology

## 2020-06-21 VITALS — BP 120/74 | HR 75 | Temp 98.1°F | Resp 17

## 2020-06-21 DIAGNOSIS — Z170 Estrogen receptor positive status [ER+]: Secondary | ICD-10-CM

## 2020-06-21 DIAGNOSIS — C50411 Malignant neoplasm of upper-outer quadrant of right female breast: Secondary | ICD-10-CM | POA: Diagnosis not present

## 2020-06-21 DIAGNOSIS — Z5112 Encounter for antineoplastic immunotherapy: Secondary | ICD-10-CM | POA: Diagnosis not present

## 2020-06-21 LAB — CMP (CANCER CENTER ONLY)
ALT: 19 U/L (ref 0–44)
AST: 21 U/L (ref 15–41)
Albumin: 4.2 g/dL (ref 3.5–5.0)
Alkaline Phosphatase: 85 U/L (ref 38–126)
Anion gap: 7 (ref 5–15)
BUN: 11 mg/dL (ref 6–20)
CO2: 27 mmol/L (ref 22–32)
Calcium: 10.4 mg/dL — ABNORMAL HIGH (ref 8.9–10.3)
Chloride: 104 mmol/L (ref 98–111)
Creatinine: 0.69 mg/dL (ref 0.44–1.00)
GFR, Estimated: >60 mL/min (ref >60)
Glucose, Bld: 85 mg/dL (ref 70–99)
Potassium: 4.2 mmol/L (ref 3.5–5.1)
Sodium: 138 mmol/L (ref 135–145)
Total Bilirubin: 0.3 mg/dL (ref 0.3–1.2)
Total Protein: 7 g/dL (ref 6.5–8.1)

## 2020-06-21 LAB — CBC WITH DIFFERENTIAL (CANCER CENTER ONLY)
Abs Immature Granulocytes: 0.05 10*3/uL (ref 0.00–0.07)
Basophils Absolute: 0.1 10*3/uL (ref 0.0–0.1)
Basophils Relative: 1 %
Eosinophils Absolute: 0 10*3/uL (ref 0.0–0.5)
Eosinophils Relative: 0 %
HCT: 40.6 % (ref 36.0–46.0)
Hemoglobin: 13.6 g/dL (ref 12.0–15.0)
Immature Granulocytes: 1 %
Lymphocytes Relative: 20 %
Lymphs Abs: 2 10*3/uL (ref 0.7–4.0)
MCH: 27.9 pg (ref 26.0–34.0)
MCHC: 33.5 g/dL (ref 30.0–36.0)
MCV: 83.2 fL (ref 80.0–100.0)
Monocytes Absolute: 0.7 10*3/uL (ref 0.1–1.0)
Monocytes Relative: 7 %
Neutro Abs: 7.2 10*3/uL (ref 1.7–7.7)
Neutrophils Relative %: 71 %
Platelet Count: 276 10*3/uL (ref 150–400)
RBC: 4.88 MIL/uL (ref 3.87–5.11)
RDW: 13.3 % (ref 11.5–15.5)
WBC Count: 10 10*3/uL (ref 4.0–10.5)
nRBC: 0 % (ref 0.0–0.2)

## 2020-06-21 MED ORDER — TRASTUZUMAB-QYYP CHEMO 150 MG IV SOLR
450.0000 mg | Freq: Once | INTRAVENOUS | Status: AC
  Administered 2020-06-21: 450 mg via INTRAVENOUS
  Filled 2020-06-21: qty 21.43

## 2020-06-21 MED ORDER — SODIUM CHLORIDE 0.9% FLUSH
10.0000 mL | INTRAVENOUS | Status: DC | PRN
  Administered 2020-06-21: 10 mL
  Filled 2020-06-21: qty 10

## 2020-06-21 MED ORDER — DIPHENHYDRAMINE HCL 25 MG PO CAPS
25.0000 mg | ORAL_CAPSULE | Freq: Once | ORAL | Status: AC
  Administered 2020-06-21: 25 mg via ORAL

## 2020-06-21 MED ORDER — ACETAMINOPHEN 325 MG PO TABS
ORAL_TABLET | ORAL | Status: AC
  Filled 2020-06-21: qty 2

## 2020-06-21 MED ORDER — ACETAMINOPHEN 325 MG PO TABS
650.0000 mg | ORAL_TABLET | Freq: Once | ORAL | Status: AC
  Administered 2020-06-21: 650 mg via ORAL

## 2020-06-21 MED ORDER — DIPHENHYDRAMINE HCL 25 MG PO CAPS
ORAL_CAPSULE | ORAL | Status: AC
  Filled 2020-06-21: qty 1

## 2020-06-21 MED ORDER — HEPARIN SOD (PORK) LOCK FLUSH 100 UNIT/ML IV SOLN
500.0000 [IU] | Freq: Once | INTRAVENOUS | Status: AC | PRN
  Administered 2020-06-21: 500 [IU]
  Filled 2020-06-21: qty 5

## 2020-06-21 MED ORDER — SODIUM CHLORIDE 0.9 % IV SOLN
Freq: Once | INTRAVENOUS | Status: AC
  Filled 2020-06-21: qty 250

## 2020-06-21 NOTE — Patient Instructions (Signed)
Trastuzumab injection for infusion What is this medicine? TRASTUZUMAB (tras TOO zoo mab) is a monoclonal antibody. It is used to treat breast cancer and stomach cancer. This medicine may be used for other purposes; ask your health care provider or pharmacist if you have questions. COMMON BRAND NAME(S): Herceptin, Herzuma, KANJINTI, Ogivri, Ontruzant, Trazimera What should I tell my health care provider before I take this medicine? They need to know if you have any of these conditions:  heart disease  heart failure  lung or breathing disease, like asthma  an unusual or allergic reaction to trastuzumab, benzyl alcohol, or other medications, foods, dyes, or preservatives  pregnant or trying to get pregnant  breast-feeding How should I use this medicine? This drug is given as an infusion into a vein. It is administered in a hospital or clinic by a specially trained health care professional. Talk to your pediatrician regarding the use of this medicine in children. This medicine is not approved for use in children. Overdosage: If you think you have taken too much of this medicine contact a poison control center or emergency room at once. NOTE: This medicine is only for you. Do not share this medicine with others. What if I miss a dose? It is important not to miss a dose. Call your doctor or health care professional if you are unable to keep an appointment. What may interact with this medicine? This medicine may interact with the following medications:  certain types of chemotherapy, such as daunorubicin, doxorubicin, epirubicin, and idarubicin This list may not describe all possible interactions. Give your health care provider a list of all the medicines, herbs, non-prescription drugs, or dietary supplements you use. Also tell them if you smoke, drink alcohol, or use illegal drugs. Some items may interact with your medicine. What should I watch for while using this medicine? Visit your  doctor for checks on your progress. Report any side effects. Continue your course of treatment even though you feel ill unless your doctor tells you to stop. Call your doctor or health care professional for advice if you get a fever, chills or sore throat, or other symptoms of a cold or flu. Do not treat yourself. Try to avoid being around people who are sick. You may experience fever, chills and shaking during your first infusion. These effects are usually mild and can be treated with other medicines. Report any side effects during the infusion to your health care professional. Fever and chills usually do not happen with later infusions. Do not become pregnant while taking this medicine or for 7 months after stopping it. Women should inform their doctor if they wish to become pregnant or think they might be pregnant. Women of child-bearing potential will need to have a negative pregnancy test before starting this medicine. There is a potential for serious side effects to an unborn child. Talk to your health care professional or pharmacist for more information. Do not breast-feed an infant while taking this medicine or for 7 months after stopping it. Women must use effective birth control with this medicine. What side effects may I notice from receiving this medicine? Side effects that you should report to your doctor or health care professional as soon as possible:  allergic reactions like skin rash, itching or hives, swelling of the face, lips, or tongue  chest pain or palpitations  cough  dizziness  feeling faint or lightheaded, falls  fever  general ill feeling or flu-like symptoms  signs of worsening heart failure like   breathing problems; swelling in your legs and feet  unusually weak or tired Side effects that usually do not require medical attention (report to your doctor or health care professional if they continue or are bothersome):  bone pain  changes in  taste  diarrhea  joint pain  nausea/vomiting  weight loss This list may not describe all possible side effects. Call your doctor for medical advice about side effects. You may report side effects to FDA at 1-800-FDA-1088. Where should I keep my medicine? This drug is given in a hospital or clinic and will not be stored at home. NOTE: This sheet is a summary. It may not cover all possible information. If you have questions about this medicine, talk to your doctor, pharmacist, or health care provider.  2021 Elsevier/Gold Standard (2016-03-07 14:37:52)  

## 2020-06-21 NOTE — Progress Notes (Signed)
Hematology and Oncology Follow Up Visit  Stephanie Chase [id number] 02/21/1961 60 y.o. 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 6 months-first dose on 04/15/2020     Interim History:  Stephanie Chase is back for follow-up.  She still has problems with the fogginess.  He still having quite a bit of fatigue.  She is having quite a bit of pain.  Again a lot of this is all from the chemotherapy that she had taken.  I suspect that this probably will last for a good 2 years.  I know she is having a hard time with working.  I just feel bad that she is having a hard time.  I hate that she is not able to work the way that she would like.  She did see a Psychiatric nurse about this painful area on the medial aspect of her right anterior chest wall where she had the mastectomy.  Nothing was really done.  I am not sure what else could be done.  We will see maybe if an ultrasound might be able to give [place] a little bit of information.  I am not sure we would find anything.  However, an ultrasound there will not exposure to any type of radiation.    We did do a bone scan on her.  The bone scan really did not show anything that was suspicious for bony metastasis.  I tried to reassure her that I just did not think that there is anything with her bones that was malignant.  She is now having some problems with urinary retention.  She says she can urinate but sometimes does not feel like she empties her bladder.  I told her that this can be evaluated by urology.  I know that they have studies to evaluate bladder function.  She like to hold off on this for right now.  She just wants to try to feel a bit better.   Again I just feel bad for her that she is having these late side effects from chemotherapy that she just may not get over anytime soon.  Overall, her performance status is ECOG 1-2.      Medications:  Current Outpatient Medications:  .  acetaminophen (TYLENOL) 500 MG tablet, Take 500-1,000 mg by mouth every 6 (six) hours as needed for moderate pain. , Disp: , Rfl:  .  albuterol (VENTOLIN HFA) 108 (90 Base) MCG/ACT inhaler, Inhale 2 puffs into the lungs every 6 (six) hours as needed for wheezing or shortness of breath., Disp: 8 g, Rfl: 2 .  ALPRAZolam (XANAX) 0.25 MG tablet, Take 1 tablet (0.25 mg total) by mouth daily as needed for anxiety., Disp: 30 tablet, Rfl: 0 .  baclofen (LIORESAL) 10 MG tablet, Take 1 tablet (10 mg total) by mouth 2 (two) times daily as needed for muscle spasms., Disp: 30 each, Rfl: 1 .  diclofenac Sodium (VOLTAREN) 1 % GEL, Apply 2 g topically 4 (four) times daily., Disp: 100 g, Rfl: 0 .  fluticasone (FLONASE) 50 MCG/ACT nasal spray, Place 2 sprays into both nostrils in the morning and at bedtime., Disp: 16 g, Rfl: 5 .  levothyroxine (SYNTHROID) 75 MCG tablet, Take 75 mcg by mouth  daily before breakfast., Disp: , Rfl:  .  lidocaine-prilocaine (EMLA) cream, Apply 1 application topically as needed., Disp: 30 g, Rfl: 1 .  liothyronine (CYTOMEL) 5 MCG tablet, TAKE 2 TABLETS (=10MCG     TOTAL)     DAILY, Disp: 180 tablet, Rfl: 1 .  Dexlansoprazole (DEXILANT) 30 MG capsule, Take 1 capsule (30 mg total) by mouth daily. (Patient taking differently: Take 30 mg by mouth daily as needed.), Disp: 30 capsule, Rfl: 1 .  DULoxetine (CYMBALTA) 20 MG capsule, Take 1 capsule (20 mg total) by mouth daily. (Patient not taking: Reported on 06/21/2020), Disp: 30 capsule, Rfl: 6 .  fluticasone (CUTIVATE) 0.05 % cream, , Disp: , Rfl:  .  fluticasone (FLOVENT HFA) 110 MCG/ACT inhaler, Inhale 2 puffs into the lungs in the morning and at bedtime., Disp: 1 each, Rfl: 12 .  LINZESS 72 MCG capsule,  TAKE 1 CAPSULE BY MOUTH DAILY BEFORE BREAKFAST. (Patient not taking: Reported on 06/21/2020), Disp: 30 capsule, Rfl: 3 .  pantoprazole (PROTONIX) 40 MG tablet, Take 40 mg by mouth daily as needed. (Patient not taking: Reported on 06/21/2020), Disp: , Rfl:  No current facility-administered medications for this visit.  Facility-Administered Medications Ordered in Other Visits:  .  alum & mag hydroxide-simeth (MAALOX/MYLANTA) 200-200-20 MG/5ML suspension 30 mL, 30 mL, Oral, Once, Tanner, Van E., PA-C .  sodium chloride flush (NS) 0.9 % injection 10 mL, 10 mL, Intracatheter, PRN, [name], MD, 10 mL at 06/21/20 1309  Allergies:  Allergies  Allergen Reactions  . Bactrim [Sulfamethoxazole-Trimethoprim] Itching and Rash    Past Medical History, Surgical history, Social history, and Family History were reviewed and updated.  Review of Systems: Review of Systems  Constitutional: Positive for fatigue.  HENT:   Positive for hearing loss and tinnitus.   Eyes: Positive for eye problems.  Respiratory: Positive for cough.   Cardiovascular: Negative.   Gastrointestinal: Positive for abdominal pain and nausea.  Endocrine: Negative.   Genitourinary: Negative.    Musculoskeletal: Positive for arthralgias and myalgias.  Skin: Negative.   Neurological: Positive for light-headedness.  Hematological: Negative.   Psychiatric/Behavioral: Negative.     Physical Exam:  vitals were not taken for this visit.   Wt Readings from Last 3 Encounters:  06/16/20 170 lb (77.1 kg)  05/31/20 177 lb 6.4 oz (80.5 kg)  05/26/20 173 lb (78.5 kg)    Physical Exam Vitals reviewed.  Constitutional:      Comments: She has had bilateral mastectomies.  Mastectomy scars are well-healed.  There is no erythema or warmth or swelling associated with these.  I cannot palpate any bilateral axillary lymph nodes.  HENT:     Head: Normocephalic and atraumatic.  Eyes:     Pupils: Pupils are equal, round, and reactive to  light.  Cardiovascular:     Rate and Rhythm: Normal rate and regular rhythm.     Heart sounds: Normal heart sounds.  Pulmonary:     Effort: Pulmonary effort is normal.     Breath sounds: Normal breath sounds.  Abdominal:     General: Bowel sounds are normal.     Palpations: Abdomen is soft.  Musculoskeletal:        General: No tenderness or deformity. Normal range of motion.     Cervical back: Normal range of motion.  Lymphadenopathy:     Cervical: No cervical adenopathy.  Skin:    General: Skin is warm and dry.     Findings: No erythema or rash.  Neurological:     Mental Status: She is alert and oriented to person, place, and time.  Psychiatric:        Behavior: Behavior normal.        Thought Content: Thought content normal.        Judgment: Judgment normal.    Lab Results  Component Value Date   WBC 10.0 06/21/2020   HGB 13.6 06/21/2020   HCT 40.6 06/21/2020   MCV 83.2 06/21/2020   PLT 276 06/21/2020     Chemistry      Component Value Date/Time   NA 138 06/21/2020 1019   K 4.2 06/21/2020 1019   CL 104 06/21/2020 1019   CO2 27 06/21/2020 1019   BUN 11 06/21/2020 1019   CREATININE 0.69 06/21/2020 1019   CREATININE 0.66 09/12/2017 0747      Component Value Date/Time   CALCIUM 10.4 (H) 06/21/2020 1019   ALKPHOS 85 06/21/2020 1019   AST 21 06/21/2020 1019   ALT 19 06/21/2020 1019   BILITOT 0.3 06/21/2020 1019      Impression and Plan: Ms. Horseman is is a very nice 60 year old postmenopausal female.  She is originally from [place].  She moved down to [facility].  She has 2 grandchildren already.  Her children live on the [place].  She has early stage breast cancer.  Unfortunately, it is HER-2 positive.  Because this, she required chemotherapy with Herceptin.  Because of the chemotherapy, and she has had side effects.  This really has affected her quality of life.  She just is having hard time working.  I told her that if she thought that she  needed disability, I am sure that she would qualify given the chemotherapy side effects.  We will go ahead with her Herceptin today.  She still is not ready for aromatase inhibitor therapy.  I just am not sure how she would tolerate this.    [name], MD 3/28/20221:13 PM

## 2020-06-21 NOTE — Patient Instructions (Signed)

## 2020-06-22 ENCOUNTER — Telehealth: Payer: Self-pay

## 2020-06-22 LAB — FOLLICLE STIMULATING HORMONE: FSH: 43.5 m[IU]/mL

## 2020-06-22 LAB — CANCER ANTIGEN 27.29: CA 27.29: 23.5 U/mL (ref 0.0–38.6)

## 2020-06-22 LAB — LUTEINIZING HORMONE: LH: 34.7 m[IU]/mL

## 2020-06-22 NOTE — Telephone Encounter (Signed)
No 06/21/20 LOS    Stephanie Chase

## 2020-06-25 LAB — ESTRADIOL, ULTRA SENS: Estradiol, Sensitive: <2.5 pg/mL

## 2020-07-01 ENCOUNTER — Other Ambulatory Visit: Payer: Self-pay | Admitting: Neurology

## 2020-07-05 ENCOUNTER — Ambulatory Visit (HOSPITAL_BASED_OUTPATIENT_CLINIC_OR_DEPARTMENT_OTHER)
Admission: RE | Admit: 2020-07-05 | Discharge: 2020-07-05 | Disposition: A | Discharge status: Home / Self Care | Payer: BC Managed Care – PPO | Source: Ambulatory Visit | Attending: Hematology & Oncology | Admitting: Hematology & Oncology

## 2020-07-05 ENCOUNTER — Ambulatory Visit: Payer: BC Managed Care – PPO | Attending: Internal Medicine

## 2020-07-05 ENCOUNTER — Other Ambulatory Visit: Payer: Self-pay

## 2020-07-05 DIAGNOSIS — Z23 Encounter for immunization: Secondary | ICD-10-CM

## 2020-07-05 DIAGNOSIS — C50411 Malignant neoplasm of upper-outer quadrant of right female breast: Secondary | ICD-10-CM | POA: Diagnosis present

## 2020-07-05 DIAGNOSIS — Z170 Estrogen receptor positive status [ER+]: Secondary | ICD-10-CM | POA: Diagnosis present

## 2020-07-05 IMAGING — US US SOFT TISSUE
1 series · 13 of 13 positions shown · non-contrast
Comparison: None.

CLINICAL DATA: Status post right mastectomy with chest pain,
initial encounter

EXAM:
ULTRASOUND OF CHEST SOFT TISSUES
TECHNIQUE: Ultrasound examination was performed in the area of clinical
concern.

[Series 1: us soft tissue · 13 acquisitions, 13 frames shown]
[im 1/13]
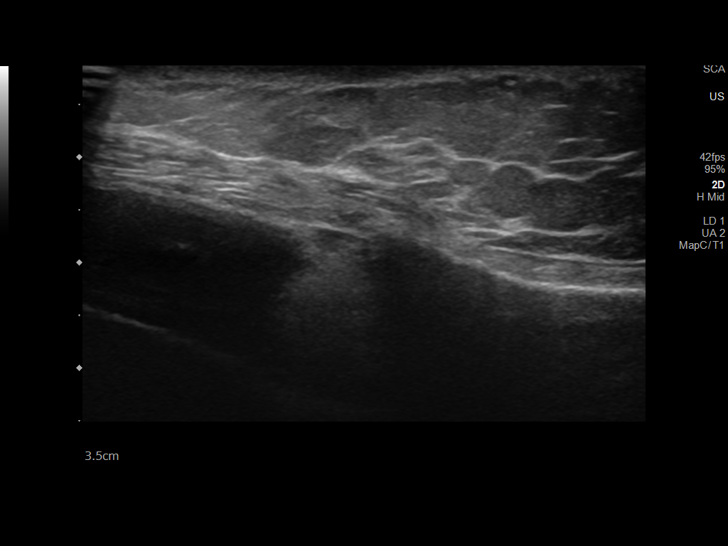
[im 2/13]
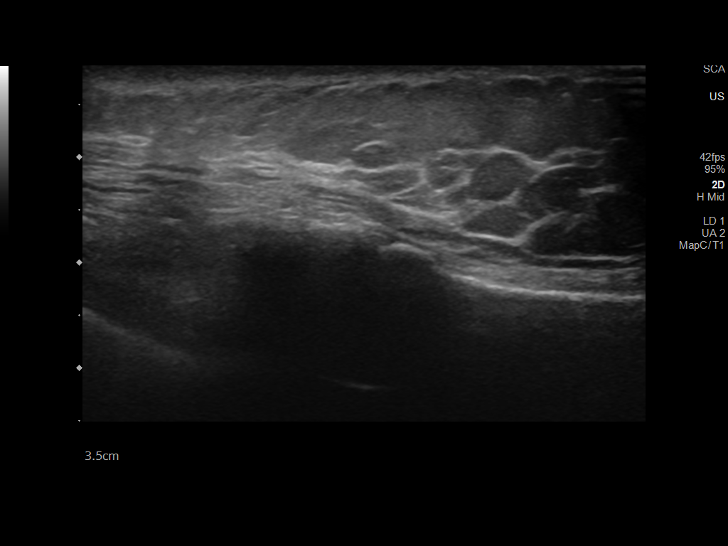
[im 3/13]
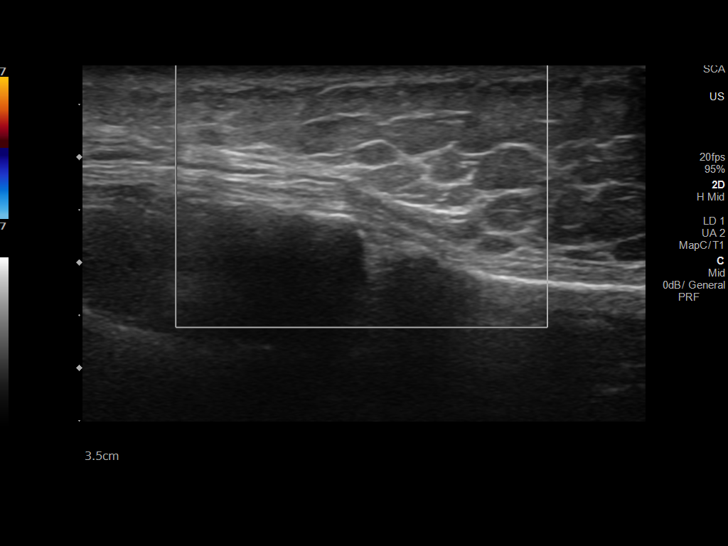
[im 4/13]
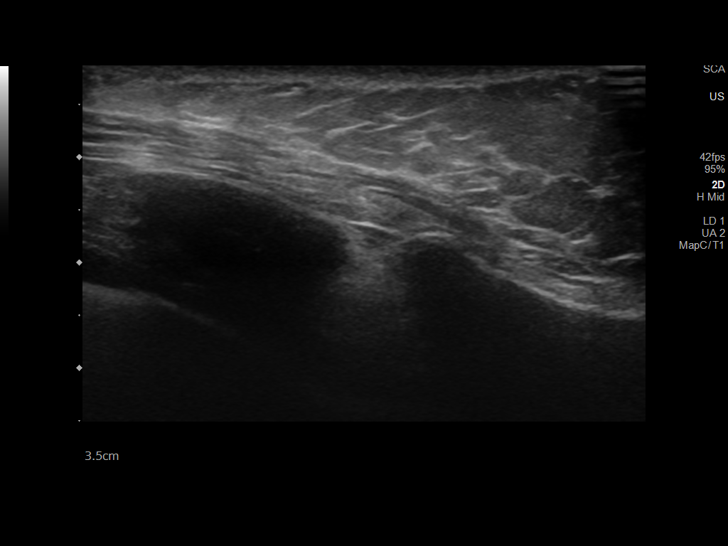
[im 5/13]
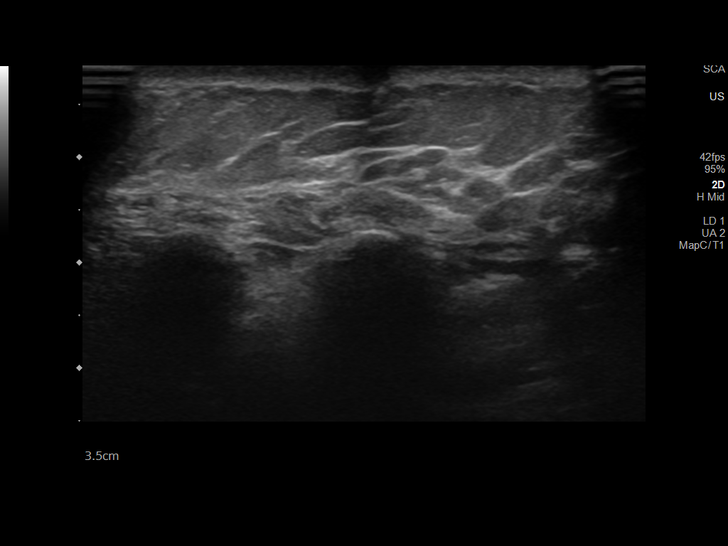
[im 6/13]
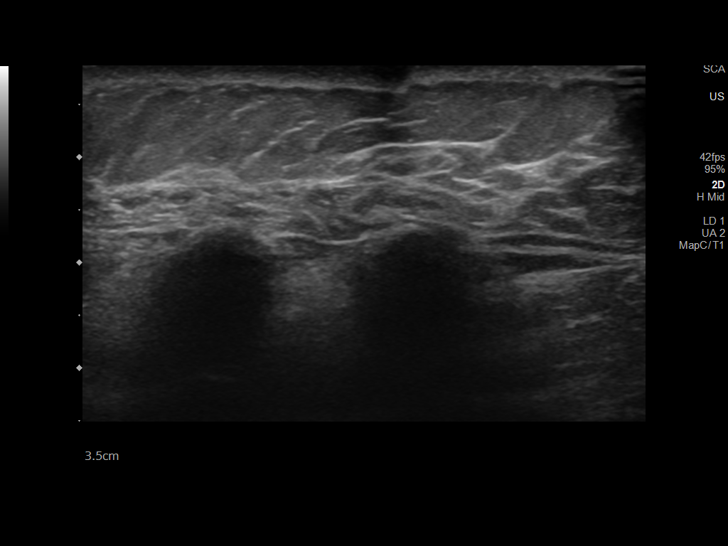
[im 7/13]
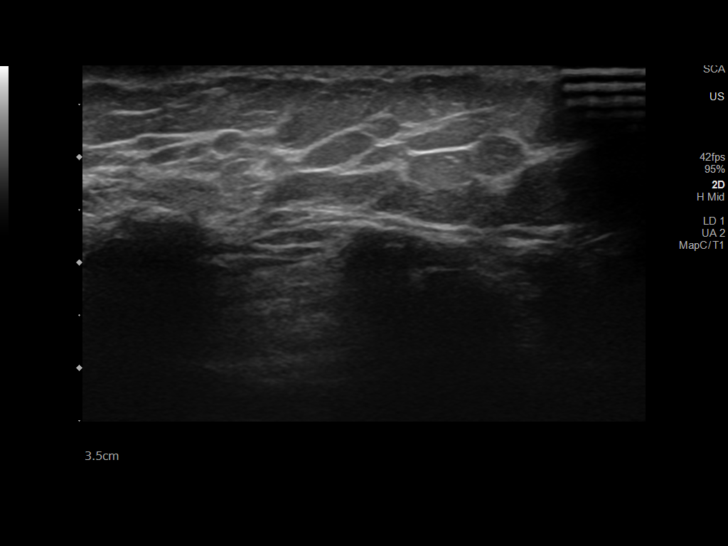
[im 8/13]
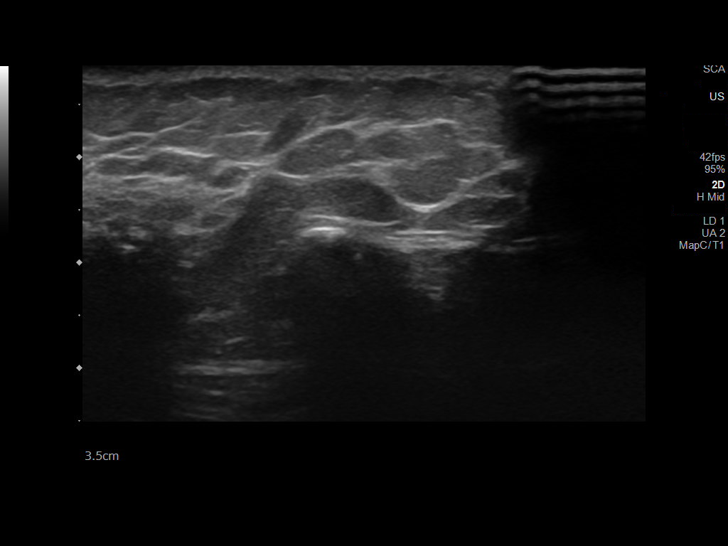
[im 9/13]
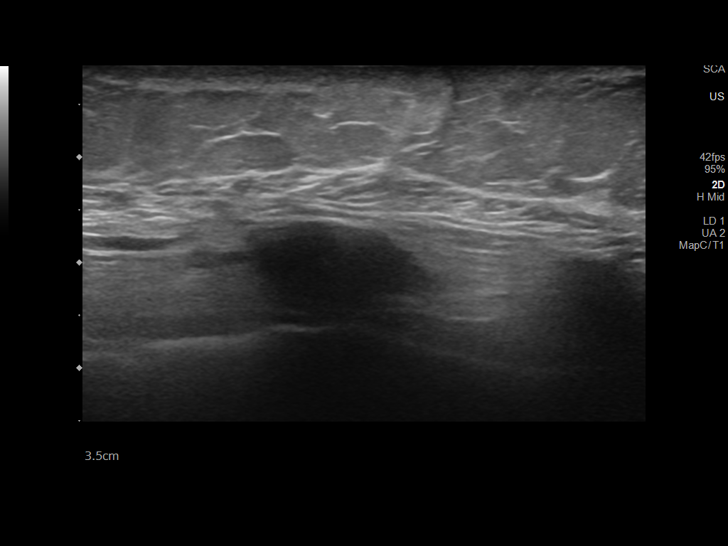
[im 10/13]
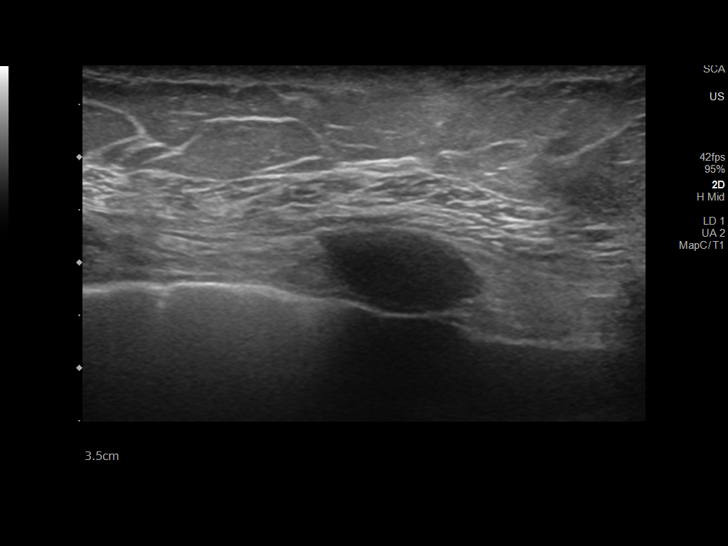
[im 11/13]
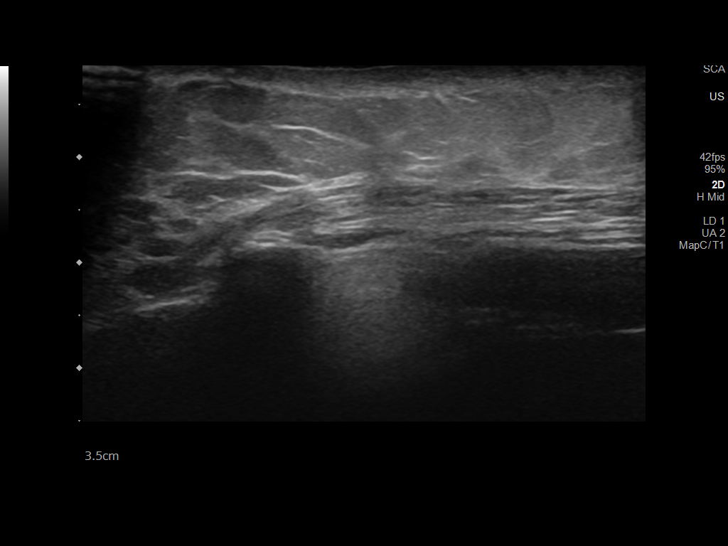
[im 12/13]
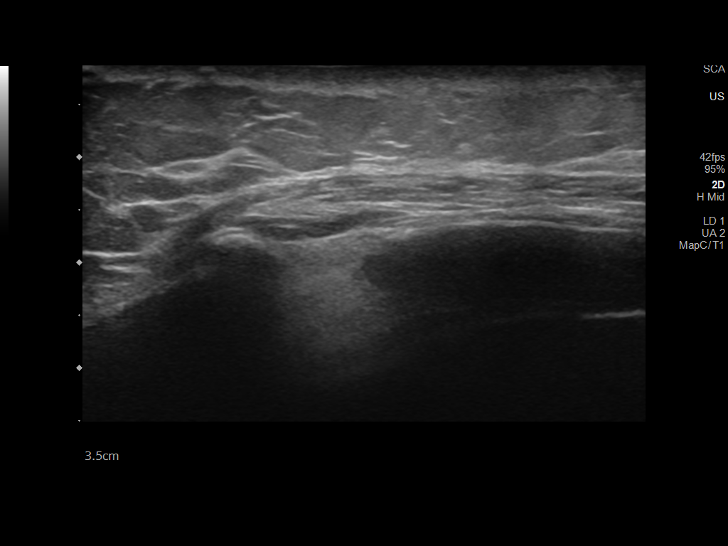
[im 13/13]
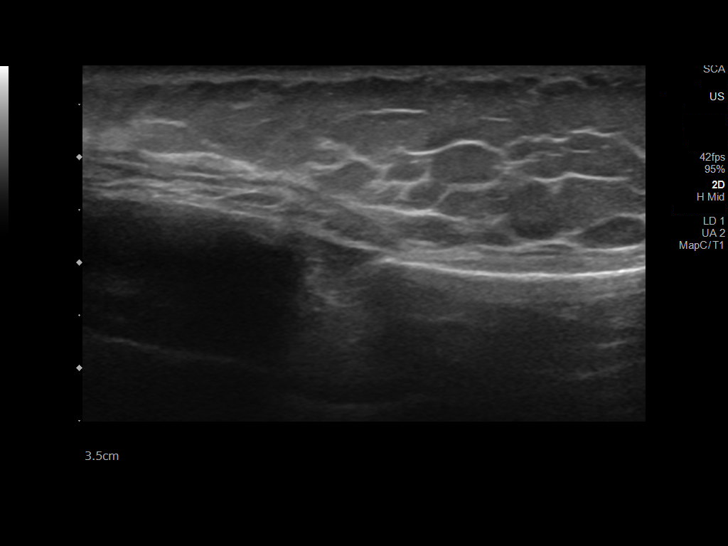

[13 of 13 positions shown; findings below may reference images not displayed]

FINDINGS: Scanning in the area of clinical concern shows no focal mass lesion
or subcutaneous fluid collection.
IMPRESSION: No acute abnormality in the area of clinical concern.

## 2020-07-05 NOTE — Progress Notes (Signed)
   Covid-19 Vaccination Clinic  Name:  [name]    MRN: [id number] DOB: [birth year]  07/05/2020  [name] was observed post Covid-19 immunization for 15 minutes without incident. She was provided with Vaccine Information Sheet and instruction to access the V-Safe system.   [name] was instructed to call 911 with any severe reactions post vaccine: Marland Kitchen Difficulty breathing  . Swelling of face and throat  . A fast heartbeat  . A bad rash all over body  . Dizziness and weakness   Immunizations Administered    Name Date Dose VIS Date Route   PFIZER Comrnaty(Gray TOP) Covid-19 Vaccine 07/05/2020 11:15 AM 0.3 mL 03/04/2020 Intramuscular   Manufacturer: [place]   Lot: [id number]   [place]: [phone]

## 2020-07-06 ENCOUNTER — Telehealth: Payer: Self-pay | Admitting: *Deleted

## 2020-07-06 NOTE — Telephone Encounter (Signed)
As noted below by Dr. Marin Olp, I informed the patient that the Ultrasound is normal. She verbalized understanding.

## 2020-07-06 NOTE — Telephone Encounter (Signed)
-----   Message from [name], MD sent at 07/06/2020  1:35 PM EDT ----- Call - the U/S is normal!!  Stephanie Chase

## 2020-07-06 NOTE — Telephone Encounter (Signed)
Patient notified per order of Dr. Marin Olp that :the U/S is normal!! Stephanie Chase" Pt appreciative of call and has no questions or concerns at this time.

## 2020-07-09 ENCOUNTER — Other Ambulatory Visit (HOSPITAL_BASED_OUTPATIENT_CLINIC_OR_DEPARTMENT_OTHER): Payer: Self-pay

## 2020-07-09 ENCOUNTER — Other Ambulatory Visit: Payer: Self-pay | Admitting: *Deleted

## 2020-07-09 DIAGNOSIS — Z170 Estrogen receptor positive status [ER+]: Secondary | ICD-10-CM

## 2020-07-09 DIAGNOSIS — C50411 Malignant neoplasm of upper-outer quadrant of right female breast: Secondary | ICD-10-CM

## 2020-07-09 MED ORDER — PFIZER-BIONT COVID-19 VAC-TRIS 30 MCG/0.3ML IM SUSP
INTRAMUSCULAR | 0 refills | Status: DC
  Filled 2020-07-09: qty 0.3, 1d supply, fill #0

## 2020-07-12 ENCOUNTER — Inpatient Hospital Stay: Payer: BC Managed Care – PPO | Attending: Hematology

## 2020-07-12 ENCOUNTER — Inpatient Hospital Stay: Payer: BC Managed Care – PPO

## 2020-07-12 ENCOUNTER — Other Ambulatory Visit (HOSPITAL_BASED_OUTPATIENT_CLINIC_OR_DEPARTMENT_OTHER): Payer: Self-pay

## 2020-07-12 ENCOUNTER — Encounter: Payer: Self-pay | Admitting: Hematology & Oncology

## 2020-07-12 ENCOUNTER — Inpatient Hospital Stay (HOSPITAL_BASED_OUTPATIENT_CLINIC_OR_DEPARTMENT_OTHER): Payer: BC Managed Care – PPO | Admitting: Hematology & Oncology

## 2020-07-12 ENCOUNTER — Other Ambulatory Visit: Payer: Self-pay

## 2020-07-12 VITALS — BP 125/81 | HR 72 | Temp 98.5°F | Resp 17 | Wt 173.0 lb

## 2020-07-12 DIAGNOSIS — Z5112 Encounter for antineoplastic immunotherapy: Secondary | ICD-10-CM | POA: Insufficient documentation

## 2020-07-12 DIAGNOSIS — C50411 Malignant neoplasm of upper-outer quadrant of right female breast: Secondary | ICD-10-CM | POA: Diagnosis not present

## 2020-07-12 DIAGNOSIS — Z9013 Acquired absence of bilateral breasts and nipples: Secondary | ICD-10-CM

## 2020-07-12 DIAGNOSIS — Z170 Estrogen receptor positive status [ER+]: Secondary | ICD-10-CM

## 2020-07-12 LAB — CBC WITH DIFFERENTIAL (CANCER CENTER ONLY)
Abs Immature Granulocytes: 0.01 10*3/uL (ref 0.00–0.07)
Basophils Absolute: 0.1 10*3/uL (ref 0.0–0.1)
Basophils Relative: 1 %
Eosinophils Absolute: 0.1 10*3/uL (ref 0.0–0.5)
Eosinophils Relative: 1 %
HCT: 40.4 % (ref 36.0–46.0)
Hemoglobin: 13.6 g/dL (ref 12.0–15.0)
Immature Granulocytes: 0 %
Lymphocytes Relative: 29 %
Lymphs Abs: 2.1 10*3/uL (ref 0.7–4.0)
MCH: 28.1 pg (ref 26.0–34.0)
MCHC: 33.7 g/dL (ref 30.0–36.0)
MCV: 83.5 fL (ref 80.0–100.0)
Monocytes Absolute: 0.7 10*3/uL (ref 0.1–1.0)
Monocytes Relative: 10 %
Neutro Abs: 4.4 10*3/uL (ref 1.7–7.7)
Neutrophils Relative %: 59 %
Platelet Count: 283 10*3/uL (ref 150–400)
RBC: 4.84 MIL/uL (ref 3.87–5.11)
RDW: 13.3 % (ref 11.5–15.5)
WBC Count: 7.2 10*3/uL (ref 4.0–10.5)
nRBC: 0 % (ref 0.0–0.2)

## 2020-07-12 LAB — COMPREHENSIVE METABOLIC PANEL
ALT: 16 U/L (ref 0–44)
AST: 18 U/L (ref 15–41)
Albumin: 4 g/dL (ref 3.5–5.0)
Alkaline Phosphatase: 100 U/L (ref 38–126)
Anion gap: 7 (ref 5–15)
BUN: 15 mg/dL (ref 6–20)
CO2: 27 mmol/L (ref 22–32)
Calcium: 10.5 mg/dL — ABNORMAL HIGH (ref 8.9–10.3)
Chloride: 103 mmol/L (ref 98–111)
Creatinine, Ser: 0.71 mg/dL (ref 0.44–1.00)
GFR, Estimated: >60 mL/min (ref >60)
Glucose, Bld: 91 mg/dL (ref 70–99)
Potassium: 4 mmol/L (ref 3.5–5.1)
Sodium: 137 mmol/L (ref 135–145)
Total Bilirubin: 0.4 mg/dL (ref 0.3–1.2)
Total Protein: 7.1 g/dL (ref 6.5–8.1)

## 2020-07-12 MED ORDER — SODIUM CHLORIDE 0.9% FLUSH
10.0000 mL | INTRAVENOUS | Status: DC | PRN
  Administered 2020-07-12: 10 mL
  Filled 2020-07-12: qty 10

## 2020-07-12 MED ORDER — ACETAMINOPHEN 325 MG PO TABS
ORAL_TABLET | ORAL | Status: AC
  Filled 2020-07-12: qty 2

## 2020-07-12 MED ORDER — DIPHENHYDRAMINE HCL 25 MG PO CAPS
ORAL_CAPSULE | ORAL | Status: AC
  Filled 2020-07-12: qty 1

## 2020-07-12 MED ORDER — HEPARIN SOD (PORK) LOCK FLUSH 100 UNIT/ML IV SOLN
500.0000 [IU] | Freq: Once | INTRAVENOUS | Status: AC | PRN
  Administered 2020-07-12: 500 [IU]
  Filled 2020-07-12: qty 5

## 2020-07-12 MED ORDER — ACETAMINOPHEN 325 MG PO TABS
650.0000 mg | ORAL_TABLET | Freq: Once | ORAL | Status: AC
  Administered 2020-07-12: 650 mg via ORAL

## 2020-07-12 MED ORDER — SODIUM CHLORIDE 0.9 % IV SOLN
Freq: Once | INTRAVENOUS | Status: AC
Start: 2020-07-12 — End: 2020-07-12
  Filled 2020-07-12: qty 250

## 2020-07-12 MED ORDER — DIPHENHYDRAMINE HCL 25 MG PO CAPS
25.0000 mg | ORAL_CAPSULE | Freq: Once | ORAL | Status: AC
  Administered 2020-07-12: 25 mg via ORAL

## 2020-07-12 MED ORDER — TRASTUZUMAB-QYYP CHEMO 150 MG IV SOLR
450.0000 mg | Freq: Once | INTRAVENOUS | Status: AC
  Administered 2020-07-12: 450 mg via INTRAVENOUS
  Filled 2020-07-12: qty 21.43

## 2020-07-12 NOTE — Progress Notes (Signed)
Hematology and Oncology Follow Up Visit  Stephanie Chase [id number] 22-Feb-1961 60 y.o. 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 6 months-first dose on 04/15/2020     Interim History:  Ms. Gary is back for follow-up.  Unfortunately, her mother is not doing all that well.  She lives up in [place].  She apparently has metastatic cancer.  She had recent findings of brain metastasis.  She also has a hematologic issue.  I know that Ms. Mangrum is doing most of the work try to help her out.  I gave her credit for doing this.  She still has her problems with fatigue.  I just wish there was what we do to try to help this out.  She does have the chronic pain issues.  She has had no problems with bleeding.  She has some constipation alternating with frequent bowel movements.  There is still some pain in the medial right anterior chest wall.  Again she has been evaluated and would not found anything that has been obvious.  She may have some scar tissue in this area.  She has had no cough.  Is been no rashes.  She does get little bit of rash after the Herceptin.  Overall, I would say performance status is ECOG 1-2.    Medications:  Current Outpatient Medications:  .  acetaminophen (TYLENOL) 500 MG tablet, Take 500-1,000 mg by mouth every 6 (six) hours as needed for moderate pain. , Disp: , Rfl:  .  albuterol (VENTOLIN HFA) 108 (90 Base) MCG/ACT inhaler, Inhale 2 puffs into the lungs every 6 (six) hours as needed for wheezing or shortness of breath., Disp: 8 g, Rfl: 2 .  ALPRAZolam (XANAX) 0.25 MG tablet, Take 1 tablet (0.25 mg total) by mouth daily as needed for anxiety., Disp: 30  tablet, Rfl: 0 .  baclofen (LIORESAL) 10 MG tablet, Take 1 tablet (10 mg total) by mouth 2 (two) times daily as needed for muscle spasms., Disp: 30 each, Rfl: 1 .  COVID-19 mRNA Vac-TriS, Pfizer, (PFIZER-BIONT COVID-19 VAC-TRIS) SUSP injection, Inject into the muscle., Disp: 0.3 mL, Rfl: 0 .  Dexlansoprazole (DEXILANT) 30 MG capsule, Take 1 capsule (30 mg total) by mouth daily. (Patient taking differently: Take 30 mg by mouth daily as needed.), Disp: 30 capsule, Rfl: 1 .  diclofenac Sodium (VOLTAREN) 1 % GEL, Apply 2 g topically 4 (four) times daily., Disp: 100 g, Rfl: 0 .  DULoxetine (CYMBALTA) 20 MG capsule, TAKE 1 CAPSULE BY MOUTH EVERY DAY, Disp: 90 capsule, Rfl: 1 .  fluticasone (FLONASE) 50 MCG/ACT nasal spray, Place 2 sprays into both nostrils in the morning and at bedtime., Disp: 16 g, Rfl: 5 .  fluticasone (FLOVENT HFA) 110 MCG/ACT inhaler, Inhale 2 puffs into the lungs in the morning and at bedtime., Disp: 1 each, Rfl: 12 .  levothyroxine (SYNTHROID) 75 MCG tablet, Take 75 mcg by mouth daily before breakfast., Disp: , Rfl:  .  lidocaine-prilocaine (EMLA) cream, Apply 1 application topically as needed., Disp: 30 g, Rfl: 1 .  LINZESS 72 MCG capsule, TAKE 1 CAPSULE BY MOUTH DAILY BEFORE BREAKFAST. (Patient not taking: Reported on 06/21/2020), Disp: 30 capsule, Rfl: 3 .  liothyronine (CYTOMEL) 5  MCG tablet, TAKE 2 TABLETS (=10MCG     TOTAL)     DAILY, Disp: 180 tablet, Rfl: 1 .  pantoprazole (PROTONIX) 40 MG tablet, Take 40 mg by mouth daily as needed. (Patient not taking: Reported on 06/21/2020), Disp: , Rfl:  No current facility-administered medications for this visit.  Facility-Administered Medications Ordered in Other Visits:  .  alum & mag hydroxide-simeth (MAALOX/MYLANTA) 200-200-20 MG/5ML suspension 30 mL, 30 mL, Oral, Once, Tanner, Lyndon Code., PA-C  Allergies:  Allergies  Allergen Reactions  . Bactrim [Sulfamethoxazole-Trimethoprim] Itching and Rash    Past Medical History, Surgical  history, Social history, and Family History were reviewed and updated.  Review of Systems: Review of Systems  Constitutional: Positive for fatigue.  HENT:   Positive for hearing loss and tinnitus.   Eyes: Positive for eye problems.  Respiratory: Positive for cough.   Cardiovascular: Negative.   Gastrointestinal: Positive for abdominal pain and nausea.  Endocrine: Negative.   Genitourinary: Negative.    Musculoskeletal: Positive for arthralgias and myalgias.  Skin: Negative.   Neurological: Positive for light-headedness.  Hematological: Negative.   Psychiatric/Behavioral: Negative.     Physical Exam:  weight is 173 lb (78.5 kg). Her oral temperature is 98.5 F (36.9 C). Her blood pressure is 125/81 and her pulse is 72. Her respiration is 17 and oxygen saturation is 100%.   Wt Readings from Last 3 Encounters:  07/12/20 173 lb (78.5 kg)  06/16/20 170 lb (77.1 kg)  05/31/20 177 lb 6.4 oz (80.5 kg)    Physical Exam Vitals reviewed.  Constitutional:      Comments: She has had bilateral mastectomies.  Mastectomy scars are well-healed.  There is no erythema or warmth or swelling associated with these.  I cannot palpate any bilateral axillary lymph nodes.  HENT:     Head: Normocephalic and atraumatic.  Eyes:     Pupils: Pupils are equal, round, and reactive to light.  Cardiovascular:     Rate and Rhythm: Normal rate and regular rhythm.     Heart sounds: Normal heart sounds.  Pulmonary:     Effort: Pulmonary effort is normal.     Breath sounds: Normal breath sounds.  Abdominal:     General: Bowel sounds are normal.     Palpations: Abdomen is soft.  Musculoskeletal:        General: No tenderness or deformity. Normal range of motion.     Cervical back: Normal range of motion.  Lymphadenopathy:     Cervical: No cervical adenopathy.  Skin:    General: Skin is warm and dry.     Findings: No erythema or rash.  Neurological:     Mental Status: She is alert and oriented to  person, place, and time.  Psychiatric:        Behavior: Behavior normal.        Thought Content: Thought content normal.        Judgment: Judgment normal.    Lab Results  Component Value Date   WBC 7.2 07/12/2020   HGB 13.6 07/12/2020   HCT 40.4 07/12/2020   MCV 83.5 07/12/2020   PLT 283 07/12/2020     Chemistry      Component Value Date/Time   NA 137 07/12/2020 0814   K 4.0 07/12/2020 0814   CL 103 07/12/2020 0814   CO2 27 07/12/2020 0814   BUN 15 07/12/2020 0814   CREATININE 0.71 07/12/2020 0814   CREATININE 0.69 06/21/2020 1019   CREATININE 0.66 09/12/2017 0747  Component Value Date/Time   CALCIUM 10.5 (H) 07/12/2020 0814   ALKPHOS 100 07/12/2020 0814   AST 18 07/12/2020 0814   AST 21 06/21/2020 1019   ALT 16 07/12/2020 0814   ALT 19 06/21/2020 1019   BILITOT 0.4 07/12/2020 0814   BILITOT 0.3 06/21/2020 1019      Impression and Plan: Ms. Halabi is is a very nice 60 year old postmenopausal female.  She is originally from [place].  She moved down to [facility].  She has 2 grandchildren already.  Her children live on the [place].  She has early stage breast cancer.  Unfortunately, it is HER-2 positive.  Because this, she required chemotherapy with Herceptin.  Because of the chemotherapy, and she has had side effects.  This really has affected her quality of life.  She just is having hard time working.  Having to help her mom up in [place] certainly is adding to her stress.  I know that she is trying her best to help and try to manage herself.  I am just not sure how we can try to help with the fatigue.  Of note, we did an echocardiogram on her back in early March.  This showed an ejection fraction of 60-65%.  I just wish we could somehow make her life better.  I think part of the problem is that she has no estrogen.  This has to be the case given that she has a estrogen positive tumor.  We will continue to follow her along every 3 weeks for  right now.    [name], MD 4/18/20228:47 AM

## 2020-07-12 NOTE — Patient Instructions (Signed)

## 2020-07-16 ENCOUNTER — Ambulatory Visit: Payer: BC Managed Care – PPO | Attending: Surgical | Admitting: Physical Therapy

## 2020-07-16 ENCOUNTER — Other Ambulatory Visit: Payer: Self-pay

## 2020-07-16 DIAGNOSIS — M6281 Muscle weakness (generalized): Secondary | ICD-10-CM

## 2020-07-16 DIAGNOSIS — R293 Abnormal posture: Secondary | ICD-10-CM | POA: Insufficient documentation

## 2020-07-16 DIAGNOSIS — M25511 Pain in right shoulder: Secondary | ICD-10-CM | POA: Diagnosis present

## 2020-07-16 NOTE — Therapy (Signed)
[place], [place], [address] Phone: [phone]   Fax:  [phone]  Physical Therapy Evaluation  Patient Details  Name: Stephanie Chase MRN: [id number] Date of Birth: 13-May-1960 Referring Provider (PT): Matthw Scheeler   Encounter Date: 07/16/2020   PT End of Session - 07/16/20 0857    Visit Number 1    Number of Visits 13    Date for PT Re-Evaluation 09/24/20    PT Start Time 0800    PT Stop Time 0845    PT Time Calculation (min) 45 min    Activity Tolerance Patient tolerated treatment well    Behavior During Therapy [facility] for tasks assessed/performed           Past Medical History:  Diagnosis Date  . Anemia   . Anxiety   . Asthma   . Breast cancer ([place])    right, 05/13/20 completed Taxol, on Herceptin  . Cervical paraspinous muscle spasm   . COPD (chronic obstructive pulmonary disease) ([facility])    emphysema  . Discoid lupus   . Discoid lupus   . Family history of bladder cancer   . Family history of BRCA gene mutation   . Family history of breast cancer   . Family history of prostate cancer   . GERD (gastroesophageal reflux disease)   . Hypercholesteremia   . Hypothyroidism   . Neuropathy   . Pneumonia   . Thyroid disease     Past Surgical History:  Procedure Laterality Date  . ABLATION ON ENDOMETRIOSIS  2016  . APPLICATION OF A-CELL OF EXTREMITY Right 10/22/2019   Procedure: EXCISION OF RIGHT BREAST WOUND WITH PRIMARY CLOSURE;  Surgeon: [name] Going, DO;  Location: [facility];  Service: Plastics;  Laterality: Right;  . BREAST LUMPECTOMY    . DEBRIDEMENT AND CLOSURE WOUND Right 10/22/2019   Procedure: Excision of right breast wound;  Surgeon: [name] Going, DO;  Location: [facility];  Service: Plastics;  Laterality: Right;  . MASTECTOMY W/ SENTINEL NODE BIOPSY Bilateral 09/05/2019   Procedure: BILATERAL MASTECTOMY WITH RIGHT SENTINEL LYMPH NODE BIOPSY;  Surgeon: Stark Klein,  MD;  Location: [facility];  Service: General;  Laterality: Bilateral;  COMBINED WITH REGIONAL FOR POST OP PAIN  . PORTACATH PLACEMENT Left 09/05/2019   Procedure: INSERTION PORT-A-CATH WITH ULTRASOUND GUIDANCE;  Surgeon: Stark Klein, MD;  Location: [facility];  Service: General;  Laterality: Left;  . TONSILLECTOMY    . TUBAL LIGATION    . WISDOM TOOTH EXTRACTION      There were no vitals filed for this visit.    Subjective Assessment - 07/16/20 0804    Subjective Pt is here to get help for the tightness in her chest and down her right arm. She also back pain, headaches and left hip pain daily with numbness on right arm, generalized muscle pain. Fatigue every day that limits her ability to work full time and limits her outdoor activities that she loves    Pertinent History Bil masectomy 09/05/19 with 3 lymph nodes removed and negative on the R and benign tisuse on the L. Pt had to ago for another debridement on August 28 by Dr. Marla Roe due to delayed healing and necrosis. Pt did not have radiation  Patient was diagnosed on 06/09/2019 with right grade III invasive ductal carcinoma breast cancer. It mesures 1.3 cm in the upper outer quadrant with 1.6 cm of calcifications and DCIS. It is ER positive, PR negative, and HER2 positive with a  Ki67 of 50%. She smokes about 1 pack/day.She did undergo chemotherapy and is still getting infusions    Patient Stated Goals Pt is going paddleboarding in June    Currently in Pain? Yes    Pain Score 2     Pain Location Axilla    Pain Orientation Right    Pain Descriptors / Indicators Tightness    Pain Type Chronic pain    Pain Radiating Towards down right side of chest    Pain Onset More than a month ago              [facility] PT Assessment - 07/16/20 0001      Assessment   Medical Diagnosis Right breast cancer    Referring Provider (PT) Matthw Scheeler    Onset Date/Surgical Date 09/05/19    Hand Dominance Right    Prior Therapy none      Precautions    Precautions Other (comment)    Precaution Comments breast cancer, bil masectomy       Restrictions   Weight Bearing Restrictions No      Balance Screen   Has the patient fallen in the past 6 months Yes    How many times? 1   pt unsure why   Has the patient had a decrease in activity level because of a fear of falling?  Yes   wants to have have someone with her in case she falls   Is the patient reluctant to leave their home because of a fear of falling?  No      Home Ecologist residence    Living Arrangements Spouse/significant other    Available Help at Discharge Family      Prior Function   Level of Independence Independent with basic ADLs    Vocation Part time employment   pt unable to work full time   [employer] work    Leisure Kayaking, paddle boarding hunting hiking, not currently exercising but wants to get back to it      Cognition   Overall Cognitive Status Within Functional Limits for tasks assessed      Observation/Other Assessments   Observations Pt with well healed bilateral mastectomy scars with slight fullness at medial aspects that is tender. She has a visible increased outward agulation at top area of sternum. Diffuse muscle atropy and generalized dryness of skin    Other Surveys  Quick Dash    Quick DASH  40.91      Sensation   Additional Comments pt reports she has occasional numbness in her left arm that may be due to neck pain      Functional Tests   Functional tests Sit to Stand      Sit to Stand   Comments 11 sit to stands in 30 seconds with no arms. RPE: 9/10   felt short of breath and pain in thighs     Posture/Postural Control   Posture/Postural Control Postural limitations    Postural Limitations Forward head;Rounded Shoulders;Decreased thoracic kyphosis      ROM / Strength   AROM / PROM / Strength AROM;Strength      AROM   Right Shoulder Extension --    Right Shoulder Flexion 160 Degrees     Right Shoulder ABduction 158 Degrees   pain and pulling down upper arm   Right Shoulder Internal Rotation --    Right Shoulder External Rotation 95 Degrees    Left Shoulder Extension --  Left Shoulder Flexion 160 Degrees    Left Shoulder ABduction 173 Degrees    Left Shoulder Internal Rotation --    Left Shoulder External Rotation 90 Degrees      Strength   Overall Strength Within functional limits for tasks performed    Strength Assessment Site Hand    Right/Left hand Right;Left    Right Hand Grip (lbs) 35/35/35    Left Hand Grip (lbs) 38/45/40      Flexibility   Soft Tissue Assessment /Muscle Length --      Palpation   Palpation comment tenderness at sterncostal junction of right chest at at fullness at medial incisions bilaterally  tender trigger points and muscle tightness at lateral aspect of right scapula and posteior axilla and shoulder      Ambulation/Gait   Gait velocity 1.3 meters per secomd      Standardized Balance Assessment   Standardized Balance Assessment Timed Up and Go Test      Timed Up and Go Test   Normal TUG (seconds) 7.82             LYMPHEDEMA/ONCOLOGY QUESTIONNAIRE - 07/16/20 0001      Type   Cancer Type Right breast cancer      Surgeries   Mastectomy Date 09/05/19    Sentinel Lymph Node Biopsy Date 09/05/19    Number Lymph Nodes Removed 3      Treatment   Active Chemotherapy Treatment No   starting chemotherapy in 2 weeks.    Past Chemotherapy Treatment No    Active Radiation Treatment No    Past Radiation Treatment No    Current Hormone Treatment Yes    Past Hormone Therapy No      What other symptoms do you have   Are you Having Heaviness or Tightness Yes    Are you having Pain Yes    Are you having pitting edema No    Is it Hard or Difficult finding clothes that fit No    Do you have infections No    Is there Decreased scar mobility No      Lymphedema Assessments   Lymphedema Assessments Upper extremities      Right Upper  Extremity Lymphedema   10 cm Proximal to Olecranon Process 30.2 cm    Olecranon Process 25.5 cm    10 cm Proximal to Ulnar Styloid Process 23.5 cm    Just Proximal to Ulnar Styloid Process 16.2 cm    At Base of 2nd Digit 6.5 cm      Left Upper Extremity Lymphedema   10 cm Proximal to Olecranon Process 30.2 cm    Olecranon Process 26 cm    10 cm Proximal to Ulnar Styloid Process 22.5 cm    Just Proximal to Ulnar Styloid Process 16.5 cm    Across Hand at [employer] 19 cm    At [place] of 2nd Digit 6.2 cm                 Quick Dash - 07/16/20 0001    Open a tight or new jar Moderate difficulty    Do heavy household chores (wash walls, wash floors) Moderate difficulty    Carry a shopping bag or briefcase Mild difficulty    Wash your back Mild difficulty    Use a knife to cut food No difficulty    Recreational activities in which you take some force or impact through your arm, shoulder, or hand (golf, hammering, tennis) Severe difficulty  During the past week, to what extent has your arm, shoulder or hand problem interfered with your normal social activities with family, friends, neighbors, or groups? Modererately    During the past week, to what extent has your arm, shoulder or hand problem limited your work or other regular daily activities Modererately    Arm, shoulder, or hand pain. Moderate    Tingling (pins and needles) in your arm, shoulder, or hand Mild    Difficulty Sleeping Moderate difficulty    DASH Score 40.91 %            Objective measurements completed on examination: See above findings.                 PT Short Term Goals - 12/16/19 1005      PT SHORT TERM GOAL #1   Title Pt will be independent with HEP and Modified MLD for the anterior trunk within 2 weeks in order to demonstrate autonomy of care.    Baseline pt provided with HEP today; pt independent with this now-12/16/19    Status Achieved             PT Long Term Goals -  07/16/20 1248      PT LONG TERM GOAL #1   Title Pt will decrease Quick DASH score to < 20 indicating an improvment in shoudler function    Baseline 40.91 on 07/16/220    Time 6    Period Weeks    Status New      PT LONG TERM GOAL #2   Title Pt will increase grip strength to > 40 pounds in right hand indicating an improvment in UE functional strength    Baseline 35 pounds on 07/16/2020    Time 6    Period Weeks    Status New      PT LONG TERM GOAL #3   Title Pt will increase # of reps of sit to stand in 30 seconds to 13 with RPE of 5/10 indicating an improvment in general functional strength    Baseline 11 in 30 sec with RPE of 9/10 on 07/16/2020    Time 6    Period Weeks    Status New      PT LONG TERM GOAL #4   Title Pt will decrease TUG score to < 7 sec demonstrating a decreased risk for fall    Baseline 7.92 on 07/16/2020    Time 6    Period Weeks    Status New                  Plan - 07/16/20 3825    Clinical Impression Statement Pt comes to PT with multiple complaints of pain and fatigue but especailly tightness in her right chest and axillary area.  She has good shoulder ROM and no signs of lymphedema but does have palpable tightness and tenderness with trigger points in this area.  She also has history of fall and generalized weakness and wants to get stronger so that she can return to paddle boarding and hiking like she did before breast cancer and generally feel better    Personal Factors and Comorbidities Comorbidity 3+    Comorbidities bil masectomy with 3 lymph node removal on the R, chemotherapy, ongoing infusions    Examination-Activity Limitations Squat;Lift;Caring for Others;Reach Overhead    Examination-Participation Restrictions Occupation;Community Activity    Stability/Clinical Decision Making Evolving/Moderate complexity    Clinical Decision Making Moderate    Rehab Potential Good  PT Frequency 2x / week    PT Duration 6 weeks    PT  Treatment/Interventions Therapeutic activities;Therapeutic exercise;Neuromuscular re-education;Manual techniques;Cryotherapy;Moist Heat;ADLs/Self Care Home Management;Manual lymph drainage;Passive range of motion;Dry needling;Taping;Patient/family education;Functional mobility training    PT Next Visit Plan Manual work to painful areas at chest and rigth scapula with gentle rib mobilization, consider taping to full area on chest?  Being progressive load resistive exercise program includeing circut training    Consulted and Agree with Plan of Care Patient           Patient will benefit from skilled therapeutic intervention in order to improve the following deficits and impairments:  Decreased knowledge of precautions,Pain,Decreased range of motion,Increased edema  Visit Diagnosis: Abnormal posture - Plan: PT plan of care cert/re-cert  Muscle weakness (generalized) - Plan: PT plan of care cert/re-cert  Right shoulder pain, unspecified chronicity - Plan: PT plan of care cert/re-cert     Problem List Patient Active Problem List   Diagnosis Date Noted  . Neck pain on left side 06/02/2020  . Chronic left-sided low back pain with left-sided sciatica 06/02/2020  . Paresthesia 05/13/2020  . Malignant neoplasm of right female breast ([facility]) 05/13/2020  . Pulmonary emphysema ([facility]) 04/15/2020  . Current smoker 04/15/2020  . Other allergy status, other than to drugs and biological substances 04/15/2020  . Pure hypercholesterolemia 04/15/2020  . Skin sensation disturbance 04/15/2020  . Spasm 04/15/2020  . Osteoporosis 03/12/2020  . Acquired absence of breast and absent nipple, bilateral 10/07/2019  . Breast wound, right, initial encounter 10/07/2019  . Breast cancer of upper-outer quadrant of right female breast ([facility]) 09/05/2019  . Genetic testing 08/12/2019  . Family history of BRCA gene mutation 07/31/2019  . Family history of breast cancer   . Family history of prostate cancer   . Family  history of bladder cancer   . Malignant neoplasm of upper-outer quadrant of right breast in female, estrogen receptor positive ([facility]) 07/24/2019  . Breast cancer ([place]) 07/18/2019  . Annual physical exam 03/31/2016  . Occupational bronchitis ([facility]) 03/03/2015  . Hyperlipidemia 03/30/2014  . 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 Generalized osteoarthritis 02/23/2012  . Plantar fasciitis, bilateral 02/23/2012  . Vitiligo 02/23/2012  . Difficulty hearing 02/13/2012  . History of lupus 12/21/2011  . Vitamin D deficiency 10/11/2010  . Hypothyroidism, unspecified 06/21/2010  . Lupus erythematosus 06/14/2010   Donato Heinz. Owens Shark PT  Norwood Levo 07/16/2020, 12:59 PM  [place], [place], [address] Phone: [phone]   Fax:  [phone]  Name: Malley Hauter MRN: [id number] Date of Birth: 1961-01-22

## 2020-07-22 ENCOUNTER — Other Ambulatory Visit: Payer: Self-pay | Admitting: Obstetrics and Gynecology

## 2020-07-28 ENCOUNTER — Ambulatory Visit: Payer: BC Managed Care – PPO | Attending: Surgical

## 2020-07-28 ENCOUNTER — Other Ambulatory Visit: Payer: Self-pay

## 2020-07-28 DIAGNOSIS — M25511 Pain in right shoulder: Secondary | ICD-10-CM

## 2020-07-28 DIAGNOSIS — M25612 Stiffness of left shoulder, not elsewhere classified: Secondary | ICD-10-CM | POA: Insufficient documentation

## 2020-07-28 DIAGNOSIS — R293 Abnormal posture: Secondary | ICD-10-CM | POA: Insufficient documentation

## 2020-07-28 DIAGNOSIS — M25611 Stiffness of right shoulder, not elsewhere classified: Secondary | ICD-10-CM | POA: Diagnosis present

## 2020-07-28 DIAGNOSIS — M6281 Muscle weakness (generalized): Secondary | ICD-10-CM

## 2020-07-28 NOTE — Patient Instructions (Signed)
PELVIC TILT: Posterior    Tighten abdominals, flatten low back. _10__ reps per set, hold for __5__ seconds; _2__ sets per day.  Then holding pelvic tilt: 1. Open and close knees like a clam 2. Alternate marching 3. Alternate heel slides keeping foot near bed  Do each 10 reps, 1-2 sets.     Cancer Rehab [phone]

## 2020-07-28 NOTE — Therapy (Signed)
[place], [place], [address] Phone: [phone]   Fax:  [phone]  Physical Therapy Treatment  Patient Details  Name: Stephanie Chase MRN: [id number] Date of Birth: 01-26-1961 Referring Provider (PT): Matthw Scheeler   Encounter Date: 07/28/2020   PT End of Session - 07/28/20 1106    Visit Number 2    Number of Visits 13    Date for PT Re-Evaluation 09/24/20    PT Start Time 1006    PT Stop Time 1102    PT Time Calculation (min) 56 min    Activity Tolerance Patient tolerated treatment well    Behavior During Therapy [facility] for tasks assessed/performed           Past Medical History:  Diagnosis Date  . Anemia   . Anxiety   . Asthma   . Breast cancer ([place])    right, 05/13/20 completed Taxol, on Herceptin  . Cervical paraspinous muscle spasm   . COPD (chronic obstructive pulmonary disease) (HCC)    emphysema  . Discoid lupus   . Discoid lupus   . Family history of bladder cancer   . Family history of BRCA gene mutation   . Family history of breast cancer   . Family history of prostate cancer   . GERD (gastroesophageal reflux disease)   . Hypercholesteremia   . Hypothyroidism   . Neuropathy   . Pneumonia   . Thyroid disease     Past Surgical History:  Procedure Laterality Date  . ABLATION ON ENDOMETRIOSIS  2016  . APPLICATION OF A-CELL OF EXTREMITY Right 10/22/2019   Procedure: EXCISION OF RIGHT BREAST WOUND WITH PRIMARY CLOSURE;  Surgeon: [name] Going, DO;  Location: [place];  Service: Plastics;  Laterality: Right;  . BREAST LUMPECTOMY    . DEBRIDEMENT AND CLOSURE WOUND Right 10/22/2019   Procedure: Excision of right breast wound;  Surgeon: [name] Going, DO;  Location: [place];  Service: Plastics;  Laterality: Right;  . MASTECTOMY W/ SENTINEL NODE BIOPSY Bilateral 09/05/2019   Procedure: BILATERAL MASTECTOMY WITH RIGHT SENTINEL LYMPH NODE BIOPSY;  Surgeon: Stark Klein, MD;   Location: [place];  Service: General;  Laterality: Bilateral;  COMBINED WITH REGIONAL FOR POST OP PAIN  . PORTACATH PLACEMENT Left 09/05/2019   Procedure: INSERTION PORT-A-CATH WITH ULTRASOUND GUIDANCE;  Surgeon: Stark Klein, MD;  Location: [place];  Service: General;  Laterality: Left;  . TONSILLECTOMY    . TUBAL LIGATION    . WISDOM TOOTH EXTRACTION      There were no vitals filed for this visit.   Subjective Assessment - 07/28/20 1012    Subjective My chest near my sternum is just so tender, I can hardly stand a shirt sometimes. My Lt hip and back are still bothering me, and now this week my Rt knee has started bothering me as well. I see Dr. Marin Olp Monday when I have another infusion and plan to talk to him about it.    Pertinent History Bil masectomy 09/05/19 with 3 lymph nodes removed and negative on the R and benign tisuse on the L. Pt had to ago for another debridement on August 28 by Dr. Marla Roe due to delayed healing and necrosis. Pt did not have radiation  Patient was diagnosed on 06/09/2019 with right grade III invasive ductal carcinoma breast cancer. It mesures 1.3 cm in the upper outer quadrant with 1.6 cm of calcifications and DCIS. It is ER positive, PR negative, and HER2 positive with  a Ki67 of 50%. She smokes about 1 pack/day.She did undergo chemotherapy and is still getting infusions    Patient Stated Goals Pt is going paddleboarding in June    Currently in Pain? Yes    Pain Score 4     Pain Location Back    Pain Orientation Mid;Lower    Pain Descriptors / Indicators Dull    Pain Type Chronic pain    Pain Radiating Towards to Lt hip    Pain Onset More than a month ago    Pain Frequency Intermittent   always feel pain, but it does reduce at time   Aggravating Factors  bed mobility like rolling over wrong; prolonged standing or sitting    Pain Relieving Factors Tylenol can take the edge off, also have muscle relaxers    Multiple Pain Sites Yes    Pain Score 3    Pain  Location Knee    Pain Orientation Right    Pain Descriptors / Indicators Sharp    Pain Type Acute pain    Pain Onset In the past 7 days    Pain Frequency Intermittent    Aggravating Factors  not sure, sharp pains just come and go    Pain Relieving Factors keeping the weight off of it                             [facility] Adult PT Treatment/Exercise - 07/28/20 0001      Lumbar Exercises: Supine   Pelvic Tilt 10 reps;5 seconds    Clam 15 reps   holding posterior pelvic tilt   Heel Slides 10 reps   holding posterior pelvic tilt   Bent Knee Raise 10 reps   holding posterior pelvic tilt     Manual Therapy   Manual Therapy Myofascial release;Passive ROM;Scapular mobilization;Soft tissue mobilization    Soft tissue mobilization In Lt S/L to Rt medial scapular border wtih cocoa butter, also to Rt pectoralis origin at sternum where pt c/o most tenderness; also breifly to bil low back erector sipnae muscles when in S/L    Myofascial Release To Rt>Lt chest wall and Rt axilla during P/ROM    Scapular Mobilization In Lt S/L to Rt scapula into protraction and retraction    Passive ROM To Rt shoulder into flexion, abduction and D2 with scpaular depression throughout                  PT Education - 07/28/20 1103    Education Details Supine core stabs    Person(s) Educated Patient    Methods Explanation;Demonstration;Handout    Comprehension Verbalized understanding;Returned demonstration               PT Long Term Goals - 07/16/20 1248      PT LONG TERM GOAL #1   Title Pt will decrease Quick DASH score to < 20 indicating an improvment in shoudler function    Baseline 40.91 on 07/16/220    Time 6    Period Weeks    Status New      PT LONG TERM GOAL #2   Title Pt will increase grip strength to > 40 pounds in right hand indicating an improvment in UE functional strength    Baseline 35 pounds on 07/16/2020    Time 6    Period Weeks    Status New      PT LONG  TERM GOAL #3   Title Pt will  increase # of reps of sit to stand in 30 seconds to 13 with RPE of 5/10 indicating an improvment in general functional strength    Baseline 11 in 30 sec with RPE of 9/10 on 07/16/2020    Time 6    Period Weeks    Status New      PT LONG TERM GOAL #4   Title Pt will decrease TUG score to < 7 sec demonstrating a decreased risk for fall    Baseline 7.92 on 07/16/2020    Time 6    Period Weeks    Status New                 Plan - 07/28/20 1106    Clinical Impression Statement Today focused on manual therapy working to decrease pts hypersensitivity at Rt medial chest wall and decrease Rt scapular tightness. Also some mild cording palpated at Rt axilla and 1 pop felt during stretching that pt reported feeling a release with. The began core stablilzations with posterior pelvic tilts and progressed to include bil LE exs with pelvic tilt as well. Pt did well with these without any increased in LBP.    Personal Factors and Comorbidities Comorbidity 3+    Comorbidities bil masectomy with 3 lymph node removal on the R, chemotherapy, ongoing infusions    Examination-Activity Limitations Squat;Lift;Caring for Others;Reach Overhead    Examination-Participation Restrictions Occupation;Community Activity    Stability/Clinical Decision Making Evolving/Moderate complexity    Rehab Potential Good    PT Frequency 2x / week    PT Duration 6 weeks    PT Treatment/Interventions Therapeutic activities;Therapeutic exercise;Neuromuscular re-education;Manual techniques;Cryotherapy;Moist Heat;ADLs/Self Care Home Management;Manual lymph drainage;Passive range of motion;Dry needling;Taping;Patient/family education;Functional mobility training    PT Next Visit Plan Cont manual work to painful areas at Rt>Lt chest and right scapula with gentle rib mobilization, consider taping to full area on chest?  Being progressive load resistive exercise program including circut training    PT Home  Exercise Plan Supine core stabs    Consulted and Agree with Plan of Care Patient           Patient will benefit from skilled therapeutic intervention in order to improve the following deficits and impairments:  Decreased knowledge of precautions,Pain,Decreased range of motion,Increased edema  Visit Diagnosis: Abnormal posture  Muscle weakness (generalized)  Right shoulder pain, unspecified chronicity     Problem List Patient Active Problem List   Diagnosis Date Noted  . Neck pain on left side 06/02/2020  . Chronic left-sided low back pain with left-sided sciatica 06/02/2020  . Paresthesia 05/13/2020  . Malignant neoplasm of right female breast ([place]) 05/13/2020  . Pulmonary emphysema ([place]) 04/15/2020  . Current smoker 04/15/2020  . Other allergy status, other than to drugs and biological substances 04/15/2020  . Pure hypercholesterolemia 04/15/2020  . Skin sensation disturbance 04/15/2020  . Spasm 04/15/2020  . Osteoporosis 03/12/2020  . Acquired absence of breast and absent nipple, bilateral 10/07/2019  . Breast wound, right, initial encounter 10/07/2019  . Breast cancer of upper-outer quadrant of right female breast ([place]) 09/05/2019  . Genetic testing 08/12/2019  . Family history of BRCA gene mutation 07/31/2019  . Family history of breast cancer   . Family history of prostate cancer   . Family history of bladder cancer   . Malignant neoplasm of upper-outer quadrant of right breast in female, estrogen receptor positive ([place]) 07/24/2019  . Breast cancer ([place]) 07/18/2019  . Annual physical exam 03/31/2016  . Occupational bronchitis (  [place]) 03/03/2015  . Hyperlipidemia 03/30/2014  . 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 Generalized osteoarthritis 02/23/2012  . Plantar fasciitis, bilateral 02/23/2012  . Vitiligo 02/23/2012  . Difficulty hearing 02/13/2012  . History of lupus 12/21/2011  . Vitamin D deficiency 10/11/2010  . Hypothyroidism, unspecified 06/21/2010  . Lupus  erythematosus 06/14/2010    Otelia Limes, PTA 07/28/2020, 11:20 AM  [facility] [place], [place], [address] Phone: [phone]   Fax:  [phone]  Name: Tsion Inghram MRN: [id number] Date of Birth: [birth year]

## 2020-07-30 ENCOUNTER — Other Ambulatory Visit: Payer: Self-pay

## 2020-07-30 ENCOUNTER — Other Ambulatory Visit: Payer: Self-pay | Admitting: Nurse Practitioner

## 2020-07-30 ENCOUNTER — Ambulatory Visit: Payer: BC Managed Care – PPO | Admitting: Physical Therapy

## 2020-07-30 ENCOUNTER — Encounter: Payer: Self-pay | Admitting: Physical Therapy

## 2020-07-30 DIAGNOSIS — M25611 Stiffness of right shoulder, not elsewhere classified: Secondary | ICD-10-CM

## 2020-07-30 DIAGNOSIS — R293 Abnormal posture: Secondary | ICD-10-CM

## 2020-07-30 DIAGNOSIS — M6281 Muscle weakness (generalized): Secondary | ICD-10-CM

## 2020-07-30 DIAGNOSIS — M25511 Pain in right shoulder: Secondary | ICD-10-CM

## 2020-07-30 DIAGNOSIS — M25612 Stiffness of left shoulder, not elsewhere classified: Secondary | ICD-10-CM

## 2020-07-30 NOTE — Therapy (Addendum)
[place], [place], [address] Phone: [phone]   Fax:  [phone]  Physical Therapy Treatment  Patient Details  Name: Stephanie Chase MRN: [id number] Date of Birth: [birth year] Referring Provider (PT): Matthw Scheeler   Encounter Date: 07/30/2020   PT End of Session - 07/30/20 1050    Visit Number 3    Number of Visits 13    Date for PT Re-Evaluation 09/24/20    PT Start Time 0805    PT Stop Time 0900    PT Time Calculation (min) 55 min    Activity Tolerance Patient tolerated treatment well    Behavior During Therapy [facility] for tasks assessed/performed           Past Medical History:  Diagnosis Date  . Anemia   . Anxiety   . Asthma   . Breast cancer ([place])    right, 05/13/20 completed Taxol, on Herceptin  . Cervical paraspinous muscle spasm   . COPD (chronic obstructive pulmonary disease) (HCC)    emphysema  . Discoid lupus   . Discoid lupus   . Family history of bladder cancer   . Family history of BRCA gene mutation   . Family history of breast cancer   . Family history of prostate cancer   . GERD (gastroesophageal reflux disease)   . Hypercholesteremia   . Hypothyroidism   . Neuropathy   . Pneumonia   . Thyroid disease     Past Surgical History:  Procedure Laterality Date  . ABLATION ON ENDOMETRIOSIS  2016  . APPLICATION OF A-CELL OF EXTREMITY Right 10/22/2019   Procedure: EXCISION OF RIGHT BREAST WOUND WITH PRIMARY CLOSURE;  Surgeon: [name] Going, DO;  Location: [place];  Service: Plastics;  Laterality: Right;  . BREAST LUMPECTOMY    . DEBRIDEMENT AND CLOSURE WOUND Right 10/22/2019   Procedure: Excision of right breast wound;  Surgeon: [name] Going, DO;  Location: [place];  Service: Plastics;  Laterality: Right;  . MASTECTOMY W/ SENTINEL NODE BIOPSY Bilateral 09/05/2019   Procedure: BILATERAL MASTECTOMY WITH RIGHT SENTINEL LYMPH NODE BIOPSY;  Surgeon: Stark Klein, MD;   Location: [place];  Service: General;  Laterality: Bilateral;  COMBINED WITH REGIONAL FOR POST OP PAIN  . PORTACATH PLACEMENT Left 09/05/2019   Procedure: INSERTION PORT-A-CATH WITH ULTRASOUND GUIDANCE;  Surgeon: Stark Klein, MD;  Location: [place];  Service: General;  Laterality: Left;  . TONSILLECTOMY    . TUBAL LIGATION    . WISDOM TOOTH EXTRACTION      There were no vitals filed for this visit.   Subjective Assessment - 07/30/20 0812    Subjective Pt reports her general pain is at about a 3. She continues to be very fatigued and has not been able to start her walking program because there is "too much going on" She says she was sore after last treatment and does not know if it helped her or not    Pertinent History Bil masectomy 09/05/19 with 3 lymph nodes removed and negative on the R and benign tisuse on the L. Pt had to ago for another debridement on August 28 by Dr. Marla Roe due to delayed healing and necrosis. Pt did not have radiation  Patient was diagnosed on 06/09/2019 with right grade III invasive ductal carcinoma breast cancer. It mesures 1.3 cm in the upper outer quadrant with 1.6 cm of calcifications and DCIS. It is ER positive, PR negative, and HER2 positive with a Ki67 of 50%. She  smokes about 1 pack/day.She did undergo chemotherapy and is still getting infusions    Patient Stated Goals Pt is going paddleboarding in June    Currently in Pain? Yes    Pain Score 3     Pain Location Back    Pain Orientation Mid;Lower;Medial    Pain Descriptors / Indicators Dull    Pain Type Chronic pain    Pain Radiating Towards left hip always bothers her    Pain Onset More than a month ago                             [facility] Adult PT Treatment/Exercise - 07/30/20 0001      Exercises   Exercises Other Exercises;Knee/Hip    Other Exercises  warm up with deep breathing and one rep of neck flexion, extension, rotation and lateral side bending, sitting cat cow, sitting marching x  5 reps and heel raises x 5 reps      Knee/Hip Exercises: Standing   Functional Squat Limitations 5 reps of sit to stand wiht glute squeeze at the top      Manual Therapy   Manual Therapy Myofascial release;Passive ROM;Soft tissue mobilization;Manual Lymphatic Drainage (MLD);Edema management    Edema Management pt given 2 white and small dotted peach foam patches to wear under compression bra/bandeau at anterior chest and also at full area at lateral chest to see if it will help with discomfort    Soft tissue mobilization In Lt S/L to Rt medial scapular border and pec major    Myofascial Release to medial chest incision    Manual Lymphatic Drainage (MLD) stationary circles at anterior chest then to left sidelying for posterior interaxillay anastaosis lateral chest, extra time spet on congested aread at lateral chest incsion.    Passive ROM to Right shoulder end range                       PT Long Term Goals - 07/16/20 1248      PT LONG TERM GOAL #1   Title Pt will decrease Quick DASH score to < 20 indicating an improvment in shoudler function    Baseline 40.91 on 07/16/220    Time 6    Period Weeks    Status New      PT LONG TERM GOAL #2   Title Pt will increase grip strength to > 40 pounds in right hand indicating an improvment in UE functional strength    Baseline 35 pounds on 07/16/2020    Time 6    Period Weeks    Status New      PT LONG TERM GOAL #3   Title Pt will increase # of reps of sit to stand in 30 seconds to 13 with RPE of 5/10 indicating an improvment in general functional strength    Baseline 11 in 30 sec with RPE of 9/10 on 07/16/2020    Time 6    Period Weeks    Status New      PT LONG TERM GOAL #4   Title Pt will decrease TUG score to < 7 sec demonstrating a decreased risk for fall    Baseline 7.92 on 07/16/2020    Time 6    Period Weeks    Status New                 Plan - 07/30/20 1050    Clinical Impression Statement  Pt continues to  have multiple complaints of pain, Talked with her about prioritizing her walking and stretching with scheduled rest periods for recovery from the stressors of her life. Manual techniques for PROM and decongestion especially at lateral chest and made some foam patches for her to try.  She wants to go paddle boarding in a month so suggested she try the positions she will be in ( kneeling, kneel to stand ) and simulation of the activity of paddling to see if she will be able to tolerate it. Aslo reminded her that walking and stretching wil help her prepare for that too    Personal Factors and Comorbidities Comorbidity 3+    Comorbidities bil masectomy with 3 lymph node removal on the R, chemotherapy, ongoing infusions    Examination-Activity Limitations Squat;Lift;Caring for Others;Reach Overhead    Examination-Participation Restrictions Occupation;Community Activity    Stability/Clinical Decision Making Evolving/Moderate complexity    Rehab Potential Good    PT Frequency 2x / week    PT Duration 6 weeks    PT Treatment/Interventions Therapeutic activities;Therapeutic exercise;Neuromuscular re-education;Manual techniques;Cryotherapy;Moist Heat;ADLs/Self Care Home Management;Manual lymph drainage;Passive range of motion;Dry needling;Taping;Patient/family education;Functional mobility training    PT Next Visit Plan how did foam patches work? any soreness after session? Cont manual work to painful areas at Rt>Lt chest and right scapula with gentle rib mobilization, consider taping to full area on chest?  Being progressive load resistive exercise program including circut training    Consulted and Agree with Plan of Care Patient           Patient will benefit from skilled therapeutic intervention in order to improve the following deficits and impairments:  Decreased knowledge of precautions,Pain,Decreased range of motion,Increased edema  Visit Diagnosis: Abnormal posture  Muscle weakness  (generalized)  Right shoulder pain, unspecified chronicity  Stiffness of left shoulder, not elsewhere classified  Stiffness of right shoulder, not elsewhere classified     Problem List Patient Active Problem List   Diagnosis Date Noted  . Neck pain on left side 06/02/2020  . Chronic left-sided low back pain with left-sided sciatica 06/02/2020  . Paresthesia 05/13/2020  . Malignant neoplasm of right female breast ([place]) 05/13/2020  . Pulmonary emphysema ([place]) 04/15/2020  . Current smoker 04/15/2020  . Other allergy status, other than to drugs and biological substances 04/15/2020  . Pure hypercholesterolemia 04/15/2020  . Skin sensation disturbance 04/15/2020  . Spasm 04/15/2020  . Osteoporosis 03/12/2020  . Acquired absence of breast and absent nipple, bilateral 10/07/2019  . Breast wound, right, initial encounter 10/07/2019  . Breast cancer of upper-outer quadrant of right female breast ([place]) 09/05/2019  . Genetic testing 08/12/2019  . Family history of BRCA gene mutation 07/31/2019  . Family history of breast cancer   . Family history of prostate cancer   . Family history of bladder cancer   . Malignant neoplasm of upper-outer quadrant of right breast in female, estrogen receptor positive ([place]) 07/24/2019  . Breast cancer ([place]) 07/18/2019  . Annual physical exam 03/31/2016  . Occupational bronchitis ([place]) 03/03/2015  . Hyperlipidemia 03/30/2014  . 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 Generalized osteoarthritis 02/23/2012  . Plantar fasciitis, bilateral 02/23/2012  . Vitiligo 02/23/2012  . Difficulty hearing 02/13/2012  . History of lupus 12/21/2011  . Vitamin D deficiency 10/11/2010  . Hypothyroidism, unspecified 06/21/2010  . Lupus erythematosus 06/14/2010   Donato Heinz. Owens Shark PT  Norwood Levo 07/30/2020, 10:57 AM  [place], [place],  [address] Phone: [phone]   Fax:   [phone]  Name: Sashay Felling MRN: [id number] Date of Birth: 18-Apr-1960

## 2020-07-30 NOTE — Telephone Encounter (Signed)
Please see refill.

## 2020-08-02 ENCOUNTER — Other Ambulatory Visit: Payer: Self-pay

## 2020-08-02 ENCOUNTER — Ambulatory Visit: Payer: BC Managed Care – PPO

## 2020-08-02 DIAGNOSIS — R293 Abnormal posture: Secondary | ICD-10-CM

## 2020-08-02 DIAGNOSIS — M6281 Muscle weakness (generalized): Secondary | ICD-10-CM

## 2020-08-02 DIAGNOSIS — M25511 Pain in right shoulder: Secondary | ICD-10-CM

## 2020-08-02 NOTE — Therapy (Signed)
[place], [place], [address] Phone: [phone]   Fax:  [phone]  Physical Therapy Treatment  Patient Details  Name: Stephanie Chase MRN: [id number] Date of Birth: [birth year] Referring Provider (PT): Matthw Scheeler   Encounter Date: 08/02/2020   PT End of Session - 08/02/20 1426    Visit Number 4    Number of Visits 13    Date for PT Re-Evaluation 09/24/20    PT Start Time 0177    PT Stop Time 1401    PT Time Calculation (min) 56 min    Activity Tolerance Patient tolerated treatment well    Behavior During Therapy [facility] for tasks assessed/performed           Past Medical History:  Diagnosis Date  . Anemia   . Anxiety   . Asthma   . Breast cancer ([facility])    right, 05/13/20 completed Taxol, on Herceptin  . Cervical paraspinous muscle spasm   . COPD (chronic obstructive pulmonary disease) (H[facility])    emphysema  . Discoid lupus   . Discoid lupus   . Family history of bladder cancer   . Family history of BRCA gene mutation   . Family history of breast cancer   . Family history of prostate cancer   . GERD (gastroesophageal reflux disease)   . Hypercholesteremia   . Hypothyroidism   . Neuropathy   . Pneumonia   . Thyroid disease     Past Surgical History:  Procedure Laterality Date  . ABLATION ON ENDOMETRIOSIS  2016  . APPLICATION OF A-CELL OF EXTREMITY Right 10/22/2019   Procedure: EXCISION OF RIGHT BREAST WOUND WITH PRIMARY CLOSURE;  Surgeon: [name] Going, DO;  Location: [facility];  Service: Plastics;  Laterality: Right;  . BREAST LUMPECTOMY    . DEBRIDEMENT AND CLOSURE WOUND Right 10/22/2019   Procedure: Excision of right breast wound;  Surgeon: [name] Going, DO;  Location: [facility];  Service: Plastics;  Laterality: Right;  . MASTECTOMY W/ SENTINEL NODE BIOPSY Bilateral 09/05/2019   Procedure: BILATERAL MASTECTOMY WITH RIGHT SENTINEL LYMPH NODE BIOPSY;  Surgeon: Stark Klein, MD;   Location: [facility];  Service: General;  Laterality: Bilateral;  COMBINED WITH REGIONAL FOR POST OP PAIN  . PORTACATH PLACEMENT Left 09/05/2019   Procedure: INSERTION PORT-A-CATH WITH ULTRASOUND GUIDANCE;  Surgeon: Stark Klein, MD;  Location: [facility];  Service: General;  Laterality: Left;  . TONSILLECTOMY    . TUBAL LIGATION    . WISDOM TOOTH EXTRACTION      There were no vitals filed for this visit.   Subjective Assessment - 08/02/20 1311    Subjective The tenderness along my Rt trunk and shoulder is a lot better. I've been using the foam but I can't tell if it's helping yet or not, but for now it's not making anything worse so I'll keep wearing it. My Lt shoulder and neck are really bothering today. I started walking more this weekend and was able to get in 5 miles. Also trying to be more aware of my posture again like [name] talked to me about last time.    Pertinent History Bil masectomy 09/05/19 with 3 lymph nodes removed and negative on the R and benign tisuse on the L. Pt had to ago for another debridement on August 28 by Dr. Marla Roe due to delayed healing and necrosis. Pt did not have radiation  Patient was diagnosed on 06/09/2019 with right grade III invasive ductal carcinoma breast cancer. It  mesures 1.3 cm in the upper outer quadrant with 1.6 cm of calcifications and DCIS. It is ER positive, PR negative, and HER2 positive with a Ki67 of 50%. She smokes about 1 pack/day.She did undergo chemotherapy and is still getting infusions    Patient Stated Goals Pt is going paddleboarding in June    Currently in Pain? No/denies   4/10 moving Lt shoulder/neck                            [facility] Adult PT Treatment/Exercise - 08/02/20 0001      Manual Therapy   Soft tissue mobilization In Rt S/L to Lt medial scapular border and upper trap being mindful to stay posterior to port    Myofascial Release to medial chest incisions with cross hands technique horizontally    Manual Lymphatic  Drainage (MLD) Rt inguinal nodes, Rt axillo-inguinal anastomosis, stationary circles at anterior chest briefly, then did same to Lt side working to decrease edema at Lt chest wall    Passive ROM In Supine to Rt shoulder into flexion, abduction and D2 then same to Lt shoulder and focused more time here today                       PT Long Term Goals - 07/16/20 1248      PT LONG TERM GOAL #1   Title Pt will decrease Quick DASH score to < 20 indicating an improvment in shoudler function    Baseline 40.91 on 07/16/220    Time 6    Period Weeks    Status New      PT LONG TERM GOAL #2   Title Pt will increase grip strength to > 40 pounds in right hand indicating an improvment in UE functional strength    Baseline 35 pounds on 07/16/2020    Time 6    Period Weeks    Status New      PT LONG TERM GOAL #3   Title Pt will increase # of reps of sit to stand in 30 seconds to 13 with RPE of 5/10 indicating an improvment in general functional strength    Baseline 11 in 30 sec with RPE of 9/10 on 07/16/2020    Time 6    Period Weeks    Status New      PT LONG TERM GOAL #4   Title Pt will decrease TUG score to < 7 sec demonstrating a decreased risk for fall    Baseline 7.92 on 07/16/2020    Time 6    Period Weeks    Status New                 Plan - 08/02/20 1428    Clinical Impression Statement Pt with much improvement noted today from last week. The anterior medial chest edema is improved and pts Rt shoulder is much iporved with less pain and less end range tightness. So focused manual therapy more on Lt upper quadrant today. Good trigger poitn release noted at Lt cervical and upper trap musculature with imporved end P/ROM of Lt shoulder after. Encouraged pt to cont getting back into her daily walking routine and working on improving her posture with sitting and standig. Pt verbalized understanding and reports feeling looser at end of session today.    Personal Factors and  Comorbidities Comorbidity 3+    Comorbidities bil masectomy with 3 lymph node removal on the  R, chemotherapy, ongoing infusions    Examination-Activity Limitations Squat;Lift;Caring for Others;Reach Overhead    Examination-Participation Restrictions Occupation;Community Activity    Stability/Clinical Decision Making Evolving/Moderate complexity    Rehab Potential Good    PT Frequency 2x / week    PT Duration 6 weeks    PT Treatment/Interventions Therapeutic activities;Therapeutic exercise;Neuromuscular re-education;Manual techniques;Cryotherapy;Moist Heat;ADLs/Self Care Home Management;Manual lymph drainage;Passive range of motion;Dry needling;Taping;Patient/family education;Functional mobility training    PT Next Visit Plan Cont manual work to painful areas at Rt>Lt chest and right scapula with gentle rib mobilization, consider taping to full area on chest?  Being progressive load resistive exercise program including circut training    PT Home Exercise Plan Supine core stabs    Consulted and Agree with Plan of Care Patient           Patient will benefit from skilled therapeutic intervention in order to improve the following deficits and impairments:  Decreased knowledge of precautions,Pain,Decreased range of motion,Increased edema  Visit Diagnosis: Abnormal posture  Muscle weakness (generalized)  Right shoulder pain, unspecified chronicity     Problem List Patient Active Problem List   Diagnosis Date Noted  . Neck pain on left side 06/02/2020  . Chronic left-sided low back pain with left-sided sciatica 06/02/2020  . Paresthesia 05/13/2020  . Malignant neoplasm of right female breast ([facility]) 05/13/2020  . Pulmonary emphysema ([facility]) 04/15/2020  . Current smoker 04/15/2020  . Other allergy status, other than to drugs and biological substances 04/15/2020  . Pure hypercholesterolemia 04/15/2020  . Skin sensation disturbance 04/15/2020  . Spasm 04/15/2020  . Osteoporosis 03/12/2020   . Acquired absence of breast and absent nipple, bilateral 10/07/2019  . Breast wound, right, initial encounter 10/07/2019  . Breast cancer of upper-outer quadrant of right female breast ([facility]) 09/05/2019  . Genetic testing 08/12/2019  . Family history of BRCA gene mutation 07/31/2019  . Family history of breast cancer   . Family history of prostate cancer   . Family history of bladder cancer   . Malignant neoplasm of upper-outer quadrant of right breast in female, estrogen receptor positive ([facility]) 07/24/2019  . Breast cancer ([facility]) 07/18/2019  . Annual physical exam 03/31/2016  . Occupational bronchitis ([facility]) 03/03/2015  . Hyperlipidemia 03/30/2014  . 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 Generalized osteoarthritis 02/23/2012  . Plantar fasciitis, bilateral 02/23/2012  . Vitiligo 02/23/2012  . Difficulty hearing 02/13/2012  . History of lupus 12/21/2011  . Vitamin D deficiency 10/11/2010  . Hypothyroidism, unspecified 06/21/2010  . Lupus erythematosus 06/14/2010    Otelia Limes, PTA 08/02/2020, 2:47 PM  [place], [place], [address] Phone: [phone]   Fax:  [phone]  Name: Stephanie Chase MRN: [id number] Date of Birth: 1960/09/28

## 2020-08-03 ENCOUNTER — Inpatient Hospital Stay: Payer: BC Managed Care – PPO

## 2020-08-03 ENCOUNTER — Other Ambulatory Visit: Payer: BC Managed Care – PPO

## 2020-08-03 ENCOUNTER — Encounter: Payer: Self-pay | Admitting: Family

## 2020-08-03 ENCOUNTER — Inpatient Hospital Stay (HOSPITAL_BASED_OUTPATIENT_CLINIC_OR_DEPARTMENT_OTHER): Payer: BC Managed Care – PPO | Admitting: Family

## 2020-08-03 ENCOUNTER — Ambulatory Visit: Payer: BC Managed Care – PPO | Admitting: Hematology & Oncology

## 2020-08-03 ENCOUNTER — Inpatient Hospital Stay: Payer: BC Managed Care – PPO | Attending: Hematology

## 2020-08-03 VITALS — Ht 67.0 in | Wt 175.0 lb

## 2020-08-03 DIAGNOSIS — M810 Age-related osteoporosis without current pathological fracture: Secondary | ICD-10-CM | POA: Diagnosis not present

## 2020-08-03 DIAGNOSIS — Z170 Estrogen receptor positive status [ER+]: Secondary | ICD-10-CM

## 2020-08-03 DIAGNOSIS — Z79899 Other long term (current) drug therapy: Secondary | ICD-10-CM | POA: Diagnosis not present

## 2020-08-03 DIAGNOSIS — M818 Other osteoporosis without current pathological fracture: Secondary | ICD-10-CM

## 2020-08-03 DIAGNOSIS — C50411 Malignant neoplasm of upper-outer quadrant of right female breast: Secondary | ICD-10-CM

## 2020-08-03 DIAGNOSIS — R5383 Other fatigue: Secondary | ICD-10-CM | POA: Diagnosis not present

## 2020-08-03 DIAGNOSIS — Z5112 Encounter for antineoplastic immunotherapy: Secondary | ICD-10-CM | POA: Diagnosis not present

## 2020-08-03 LAB — CBC WITH DIFFERENTIAL (CANCER CENTER ONLY)
Abs Immature Granulocytes: 0.03 10*3/uL (ref 0.00–0.07)
Basophils Absolute: 0.1 10*3/uL (ref 0.0–0.1)
Basophils Relative: 1 %
Eosinophils Absolute: 0 10*3/uL (ref 0.0–0.5)
Eosinophils Relative: 0 %
HCT: 41 % (ref 36.0–46.0)
Hemoglobin: 14 g/dL (ref 12.0–15.0)
Immature Granulocytes: 0 %
Lymphocytes Relative: 27 %
Lymphs Abs: 2.1 10*3/uL (ref 0.7–4.0)
MCH: 28.6 pg (ref 26.0–34.0)
MCHC: 34.1 g/dL (ref 30.0–36.0)
MCV: 83.7 fL (ref 80.0–100.0)
Monocytes Absolute: 0.6 10*3/uL (ref 0.1–1.0)
Monocytes Relative: 7 %
Neutro Abs: 5.1 10*3/uL (ref 1.7–7.7)
Neutrophils Relative %: 65 %
Platelet Count: 266 10*3/uL (ref 150–400)
RBC: 4.9 MIL/uL (ref 3.87–5.11)
RDW: 13.5 % (ref 11.5–15.5)
WBC Count: 7.8 10*3/uL (ref 4.0–10.5)
nRBC: 0 % (ref 0.0–0.2)

## 2020-08-03 LAB — CMP (CANCER CENTER ONLY)
ALT: 15 U/L (ref 0–44)
AST: 18 U/L (ref 15–41)
Albumin: 4.3 g/dL (ref 3.5–5.0)
Alkaline Phosphatase: 98 U/L (ref 38–126)
Anion gap: 6 (ref 5–15)
BUN: 13 mg/dL (ref 6–20)
CO2: 27 mmol/L (ref 22–32)
Calcium: 10.5 mg/dL — ABNORMAL HIGH (ref 8.9–10.3)
Chloride: 104 mmol/L (ref 98–111)
Creatinine: 0.7 mg/dL (ref 0.44–1.00)
GFR, Estimated: >60 mL/min (ref >60)
Glucose, Bld: 90 mg/dL (ref 70–99)
Potassium: 3.9 mmol/L (ref 3.5–5.1)
Sodium: 137 mmol/L (ref 135–145)
Total Bilirubin: 0.3 mg/dL (ref 0.3–1.2)
Total Protein: 7 g/dL (ref 6.5–8.1)

## 2020-08-03 LAB — LACTATE DEHYDROGENASE: LDH: 171 U/L (ref 98–192)

## 2020-08-03 MED ORDER — DENOSUMAB 120 MG/1.7ML ~~LOC~~ SOLN
SUBCUTANEOUS | Status: AC
  Filled 2020-08-03: qty 1.7

## 2020-08-03 MED ORDER — ACETAMINOPHEN 325 MG PO TABS
ORAL_TABLET | ORAL | Status: AC
  Filled 2020-08-03: qty 2

## 2020-08-03 MED ORDER — SODIUM CHLORIDE 0.9 % IV SOLN
Freq: Once | INTRAVENOUS | Status: AC
Start: 2020-08-03 — End: 2020-08-03
  Filled 2020-08-03: qty 250

## 2020-08-03 MED ORDER — DENOSUMAB 60 MG/ML ~~LOC~~ SOSY
60.0000 mg | PREFILLED_SYRINGE | Freq: Once | SUBCUTANEOUS | Status: AC
  Administered 2020-08-03: 60 mg via SUBCUTANEOUS

## 2020-08-03 MED ORDER — DIPHENHYDRAMINE HCL 25 MG PO CAPS
ORAL_CAPSULE | ORAL | Status: AC
  Filled 2020-08-03: qty 1

## 2020-08-03 MED ORDER — HEPARIN SOD (PORK) LOCK FLUSH 100 UNIT/ML IV SOLN
500.0000 [IU] | Freq: Once | INTRAVENOUS | Status: AC | PRN
  Administered 2020-08-03: 500 [IU]
  Filled 2020-08-03: qty 5

## 2020-08-03 MED ORDER — SODIUM CHLORIDE 0.9% FLUSH
10.0000 mL | INTRAVENOUS | Status: DC | PRN
  Administered 2020-08-03: 10 mL
  Filled 2020-08-03: qty 10

## 2020-08-03 MED ORDER — ACETAMINOPHEN 325 MG PO TABS
650.0000 mg | ORAL_TABLET | Freq: Once | ORAL | Status: AC
  Administered 2020-08-03: 650 mg via ORAL

## 2020-08-03 MED ORDER — TRASTUZUMAB-QYYP CHEMO 150 MG IV SOLR
450.0000 mg | Freq: Once | INTRAVENOUS | Status: AC
  Administered 2020-08-03: 450 mg via INTRAVENOUS
  Filled 2020-08-03: qty 21.43

## 2020-08-03 MED ORDER — DENOSUMAB 60 MG/ML ~~LOC~~ SOSY
PREFILLED_SYRINGE | SUBCUTANEOUS | Status: AC
  Filled 2020-08-03: qty 1

## 2020-08-03 MED ORDER — DIPHENHYDRAMINE HCL 25 MG PO CAPS
25.0000 mg | ORAL_CAPSULE | Freq: Once | ORAL | Status: AC
Start: 2020-08-03 — End: 2020-08-03
  Administered 2020-08-03: 25 mg via ORAL

## 2020-08-03 NOTE — Patient Instructions (Signed)
Implanted Port Insertion, Care After This sheet gives you information about how to care for yourself after your procedure. Your health care provider may also give you more specific instructions. If you have problems or questions, contact your health care provider. What can I expect after the procedure? After the procedure, it is common to have:  Discomfort at the port insertion site.  Bruising on the skin over the port. This should improve over 3-4 days. Follow these instructions at home: Port care  After your port is placed, you will get a manufacturer's information card. The card has information about your port. Keep this card with you at all times.  Take care of the port as told by your health care provider. Ask your health care provider if you or a family member can get training for taking care of the port at home. A home health care nurse may also take care of the port.  Make sure to remember what type of port you have. Incision care  Follow instructions from your health care provider about how to take care of your port insertion site. Make sure you: ? Wash your hands with soap and water before and after you change your bandage (dressing). If soap and water are not available, use hand sanitizer. ? Change your dressing as told by your health care provider. ? Leave stitches (sutures), skin glue, or adhesive strips in place. These skin closures may need to stay in place for 2 weeks or longer. If adhesive strip edges start to loosen and curl up, you may trim the loose edges. Do not remove adhesive strips completely unless your health care provider tells you to do that.  Check your port insertion site every day for signs of infection. Check for: ? Redness, swelling, or pain. ? Fluid or blood. ? Warmth. ? Pus or a bad smell.      Activity  Return to your normal activities as told by your health care provider. Ask your health care provider what activities are safe for you.  Do not  lift anything that is heavier than 10 lb (4.5 kg), or the limit that you are told, until your health care provider says that it is safe. General instructions  Take over-the-counter and prescription medicines only as told by your health care provider.  Do not take baths, swim, or use a hot tub until your health care provider approves. Ask your health care provider if you may take showers. You may only be allowed to take sponge baths.  Do not drive for 24 hours if you were given a sedative during your procedure.  Wear a medical alert bracelet in case of an emergency. This will tell any health care providers that you have a port.  Keep all follow-up visits as told by your health care provider. This is important. Contact a health care provider if:  You cannot flush your port with saline as directed, or you cannot draw blood from the port.  You have a fever or chills.  You have redness, swelling, or pain around your port insertion site.  You have fluid or blood coming from your port insertion site.  Your port insertion site feels warm to the touch.  You have pus or a bad smell coming from the port insertion site. Get help right away if:  You have chest pain or shortness of breath.  You have bleeding from your port that you cannot control. Summary  Take care of the port as told by your   health care provider. Keep the manufacturer's information card with you at all times.  Change your dressing as told by your health care provider.  Contact a health care provider if you have a fever or chills or if you have redness, swelling, or pain around your port insertion site.  Keep all follow-up visits as told by your health care provider. This information is not intended to replace advice given to you by your health care provider. Make sure you discuss any questions you have with your health care provider. Document Revised: 10/09/2017 Document Reviewed: 10/09/2017 Elsevier Patient Education   2021 Elsevier Inc.  

## 2020-08-03 NOTE — Patient Instructions (Signed)
[facility]  Discharge Instructions: Thank you for choosing [facility] to provide your oncology and hematology care.   If you have a lab appointment with the [facility], please go directly to the [facility] and check in at the registration area.  Wear comfortable clothing and clothing appropriate for easy access to any Portacath or PICC line.   We strive to give you quality time with your provider. You may need to reschedule your appointment if you arrive late (15 or more minutes).  Arriving late affects you and other patients whose appointments are after yours.  Also, if you miss three or more appointments without notifying the office, you may be dismissed from the clinic at the provider's discretion.      For prescription refill requests, have your pharmacy contact our office and allow 72 hours for refills to be completed.    Today you received the following chemotherapy and/or immunotherapy agents herceptin prolia    To help prevent nausea and vomiting after your treatment, we encourage you to take your nausea medication as directed.  BELOW ARE SYMPTOMS THAT SHOULD BE REPORTED IMMEDIATELY: . *FEVER GREATER THAN 100.4 F (38 C) OR HIGHER . *CHILLS OR SWEATING . *NAUSEA AND VOMITING THAT IS NOT CONTROLLED WITH YOUR NAUSEA MEDICATION . *UNUSUAL SHORTNESS OF BREATH . *UNUSUAL BRUISING OR BLEEDING . *URINARY PROBLEMS (pain or burning when urinating, or frequent urination) . *BOWEL PROBLEMS (unusual diarrhea, constipation, pain near the anus) . TENDERNESS IN MOUTH AND THROAT WITH OR WITHOUT PRESENCE OF ULCERS (sore throat, sores in mouth, or a toothache) . UNUSUAL RASH, SWELLING OR PAIN  . UNUSUAL VAGINAL DISCHARGE OR ITCHING   Items with * indicate a potential emergency and should be followed up as soon as possible or go to the Emergency Department if any problems should occur.  Please show the CHEMOTHERAPY ALERT CARD or IMMUNOTHERAPY ALERT  CARD at check-in to the Emergency Department and triage nurse. Should you have questions after your visit or need to cancel or reschedule your appointment, please contact [facility]  [phone] and follow the prompts.  Office hours are 8:00 a.m. to 4:30 p.m. Monday - Friday. Please note that voicemails left after 4:00 p.m. may not be returned until the following business day.  We are closed weekends and major holidays. You have access to a nurse at all times for urgent questions. Please call the main number to the clinic [phone] and follow the prompts.  For any non-urgent questions, you may also contact your provider using MyChart. We now offer e-Visits for anyone 49 and older to request care online for non-urgent symptoms. For details visit [email or web address].   Also download the MyChart app! Go to the app store, search "MyChart", open the app, select [facility], and log in with your MyChart username and password.  Due to Covid, a mask is required upon entering the hospital/clinic. If you do not have a mask, one will be given to you upon arrival. For doctor visits, patients may have 1 support person aged 59 or older with them. For treatment visits, patients cannot have anyone with them due to current Covid guidelines and our immunocompromised population.

## 2020-08-03 NOTE — Progress Notes (Signed)
Hematology and Oncology Follow Up Visit  Stephanie Chase [id number] 1960-07-07 60 y.o. 08/03/2020   Principle Diagnosis:  Stage IA (T1cN0M0) infiltrating ductal carcinoma of the RIGHT breast -- ER+/PR-/HER2+ Osteoporosis-secondary to chemotherapy  Past Therapy: S/P bilateral mastectomy -- June 2021 S/p Taxol/Herceptin -- completed 12 weeks on 01/26/2020  Current Therapy:        Herceptin -- maintenance - start 02/10/2020  Prolia 60 mg subcu q. 6 months - first dose on 04/15/2020  Interim History:  [name] is here today for follow-up and treatment. She is doing fairly well but is still having generalized aches and pains and fatigue.  She is working but is finding it hard to do her job. They will not let her work from home.  She is also staying busy helping her mother.  She states that she had an abnormal pap smear recently and will be having the LEEP procedure on 10/01/2020.  No fever, chills, n/v, cough, rash, dizziness, SOB, chest pain, palpitations, abdominal pain or changes in bowel or bladder habits at this time.  She is smoking 1 ppd.  No swelling in her extremities.  She uses a special cream on her hands for dry skin and she notes that this seems to help and her skin is no longer cracking.  No falls or syncope to report.  Her appetite comes and goes. She is doing her best to stay well hydrated. Her weight is stable at 175 lbs.   ECOG Performance Status: 1 - Symptomatic but completely ambulatory  Medications:  Allergies as of 08/03/2020      Reactions   Bactrim [sulfamethoxazole-trimethoprim] Itching, Rash      Medication List       Accurate as of Aug 03, 2020 12:03 PM. If you have any questions, ask your nurse or doctor.        acetaminophen 500 MG tablet Commonly known as: TYLENOL Take 500-1,000 mg by mouth every 6 (six) hours as needed for moderate pain.   albuterol 108 (90 Base) MCG/ACT inhaler Commonly known as: VENTOLIN HFA Inhale 2 puffs into the lungs  every 6 (six) hours as needed for wheezing or shortness of breath.   ALPRAZolam 0.25 MG tablet Commonly known as: XANAX Take 1 tablet (0.25 mg total) by mouth daily as needed for anxiety.   baclofen 10 MG tablet Commonly known as: LIORESAL TAKE 1 TABLET BY MOUTH TWICE A DAY AS NEEDED FOR MUSCLE SPASMS   Dexilant 30 MG capsule Generic drug: Dexlansoprazole Take 1 capsule (30 mg total) by mouth daily. What changed:   when to take this  reasons to take this   diclofenac Sodium 1 % Gel Commonly known as: Voltaren Apply 2 g topically 4 (four) times daily.   DULoxetine 20 MG capsule Commonly known as: CYMBALTA TAKE 1 CAPSULE BY MOUTH EVERY DAY   fluticasone 110 MCG/ACT inhaler Commonly known as: FLOVENT HFA Inhale 2 puffs into the lungs in the morning and at bedtime.   fluticasone 50 MCG/ACT nasal spray Commonly known as: FLONASE Place 2 sprays into both nostrils in the morning and at bedtime.   levothyroxine 75 MCG tablet Commonly known as: SYNTHROID Take 75 mcg by mouth daily before breakfast.   lidocaine-prilocaine cream Commonly known as: EMLA Apply 1 application topically as needed.   Linzess 72 MCG capsule Generic drug: linaclotide TAKE 1 CAPSULE BY MOUTH DAILY BEFORE BREAKFAST.   liothyronine 5 MCG tablet Commonly known as: CYTOMEL TAKE 2 TABLETS (=10MCG     TOTAL)  DAILY   pantoprazole 40 MG tablet Commonly known as: PROTONIX Take 40 mg by mouth daily as needed.   Pfizer-BioNT COVID-19 Vac-TriS Susp injection Generic drug: COVID-19 mRNA Vac-TriS (Pfizer) Inject into the muscle.       Allergies:  Allergies  Allergen Reactions  . Bactrim [Sulfamethoxazole-Trimethoprim] Itching and Rash    Past Medical History, Surgical history, Social history, and Family History were reviewed and updated.  Review of Systems: All other 10 point review of systems is negative.   Physical Exam:  vitals were not taken for this visit.   Wt Readings from Last 3  Encounters:  08/03/20 175 lb (79.4 kg)  07/12/20 173 lb (78.5 kg)  06/16/20 170 lb (77.1 kg)    Ocular: Sclerae unicteric, pupils equal, round and reactive to light Ear-nose-throat: Oropharynx clear, dentition fair Lymphatic: No cervical, supraclavicular or axillary adenopathy Lungs no rales or rhonchi, good excursion bilaterally Heart regular rate and rhythm, no murmur appreciated Abd soft, nontender, positive bowel sounds MSK no focal spinal tenderness, no joint edema Neuro: non-focal, well-oriented, appropriate affect Breasts: No changes. Bilateral mastectomy, no mass, lesion or rash noted.   Lab Results  Component Value Date   WBC 7.8 08/03/2020   HGB 14.0 08/03/2020   HCT 41.0 08/03/2020   MCV 83.7 08/03/2020   PLT 266 08/03/2020   Lab Results  Component Value Date   FERRITIN 67 05/31/2020   IRON 72 05/31/2020   TIBC 321 05/31/2020   UIBC 250 05/31/2020   IRONPCTSAT 22 05/31/2020   Lab Results  Component Value Date   RBC 4.90 08/03/2020   No results found for: KPAFRELGTCHN, LAMBDASER, KAPLAMBRATIO No results found for: IGGSERUM, IGA, IGMSERUM No results found for: Odetta Pink, SPEI   Chemistry      Component Value Date/Time   NA 137 07/12/2020 0814   K 4.0 07/12/2020 0814   CL 103 07/12/2020 0814   CO2 27 07/12/2020 0814   BUN 15 07/12/2020 0814   CREATININE 0.71 07/12/2020 0814   CREATININE 0.69 06/21/2020 1019   CREATININE 0.66 09/12/2017 0747      Component Value Date/Time   CALCIUM 10.5 (H) 07/12/2020 0814   ALKPHOS 100 07/12/2020 0814   AST 18 07/12/2020 0814   AST 21 06/21/2020 1019   ALT 16 07/12/2020 0814   ALT 19 06/21/2020 1019   BILITOT 0.4 07/12/2020 0814   BILITOT 0.3 06/21/2020 1019       Impression and Plan: [name] is a very pleasant 60 yo post menopausal female with stage IA (T1cN0M0) infiltrating ductal carcinoma of the RIGHT breast -- ER+/PR-/HER2+ and osteoporosis  secondary to chemotherapy.  We will proceed with treatment today as planned. She will complete maintenance therapy in September 2022.  She also received her first dose of Prolia today as well.  She will let us know if we need to fill out any new forms for her work. She states she has to discuss with HR.  Follow-up in 3 weeks.  She was encouraged to contact our office with any questions or concerns.   Laverna Peace, NP 5/10/202212:03 PM

## 2020-08-04 ENCOUNTER — Ambulatory Visit: Payer: BC Managed Care – PPO

## 2020-08-04 ENCOUNTER — Other Ambulatory Visit: Payer: Self-pay

## 2020-08-04 DIAGNOSIS — R293 Abnormal posture: Secondary | ICD-10-CM | POA: Diagnosis not present

## 2020-08-04 DIAGNOSIS — M25511 Pain in right shoulder: Secondary | ICD-10-CM

## 2020-08-04 DIAGNOSIS — M6281 Muscle weakness (generalized): Secondary | ICD-10-CM

## 2020-08-04 LAB — IRON AND TIBC
Iron: 86 ug/dL (ref 41–142)
Saturation Ratios: 25 % (ref 21–57)
TIBC: 345 ug/dL (ref 236–444)
UIBC: 259 ug/dL (ref 120–384)

## 2020-08-04 LAB — FERRITIN: Ferritin: 52 ng/mL (ref 11–307)

## 2020-08-04 LAB — TSH: TSH: 0.75 u[IU]/mL (ref 0.308–3.960)

## 2020-08-04 NOTE — Therapy (Signed)
[place], [place], [address] Phone: [phone]   Fax:  [phone]  Physical Therapy Treatment  Patient Details  Name: Stephanie Chase MRN: [id number] Date of Birth: 02/19/1961 Referring Provider (PT): Matthw Scheeler   Encounter Date: 08/04/2020   PT End of Session - 08/04/20 1217    Visit Number 5    Number of Visits 13    Date for PT Re-Evaluation 09/24/20    PT Start Time 1107    PT Stop Time 1204    PT Time Calculation (min) 57 min    Activity Tolerance Patient tolerated treatment well    Behavior During Therapy [facility] for tasks assessed/performed           Past Medical History:  Diagnosis Date  . Anemia   . Anxiety   . Asthma   . Breast cancer ([place])    right, 05/13/20 completed Taxol, on Herceptin  . Cervical paraspinous muscle spasm   . COPD (chronic obstructive pulmonary disease) (HCC)    emphysema  . Discoid lupus   . Discoid lupus   . Family history of bladder cancer   . Family history of BRCA gene mutation   . Family history of breast cancer   . Family history of prostate cancer   . GERD (gastroesophageal reflux disease)   . Hypercholesteremia   . Hypothyroidism   . Neuropathy   . Pneumonia   . Thyroid disease     Past Surgical History:  Procedure Laterality Date  . ABLATION ON ENDOMETRIOSIS  2016  . APPLICATION OF A-CELL OF EXTREMITY Right 10/22/2019   Procedure: EXCISION OF RIGHT BREAST WOUND WITH PRIMARY CLOSURE;  Surgeon: [name] Going, DO;  Location: [place];  Service: Plastics;  Laterality: Right;  . BREAST LUMPECTOMY    . DEBRIDEMENT AND CLOSURE WOUND Right 10/22/2019   Procedure: Excision of right breast wound;  Surgeon: [name] Going, DO;  Location: [place];  Service: Plastics;  Laterality: Right;  . MASTECTOMY W/ SENTINEL NODE BIOPSY Bilateral 09/05/2019   Procedure: BILATERAL MASTECTOMY WITH RIGHT SENTINEL LYMPH NODE BIOPSY;  Surgeon: Stark Klein, MD;   Location: [place];  Service: General;  Laterality: Bilateral;  COMBINED WITH REGIONAL FOR POST OP PAIN  . PORTACATH PLACEMENT Left 09/05/2019   Procedure: INSERTION PORT-A-CATH WITH ULTRASOUND GUIDANCE;  Surgeon: Stark Klein, MD;  Location: [place];  Service: General;  Laterality: Left;  . TONSILLECTOMY    . TUBAL LIGATION    . WISDOM TOOTH EXTRACTION      There were no vitals filed for this visit.   Subjective Assessment - 08/04/20 1111    Subjective I had an infusion yesterday so I'm really tired today. Continuing to wear the compression foam at my chest. Had my first Prolia injection yesterday which is for osteoporosis so I'm hoping this will help some of the bone pain I've been having. I have noticed some improvement where reaching across my body with my Lt arm would hurt at my chest, that doesn't bother me anymore.    Pertinent History Bil masectomy 09/05/19 with 3 lymph nodes removed and negative on the R and benign tisuse on the L. Pt had to ago for another debridement on August 28 by Dr. Marla Roe due to delayed healing and necrosis. Pt did not have radiation  Patient was diagnosed on 06/09/2019 with right grade III invasive ductal carcinoma breast cancer. It mesures 1.3 cm in the upper outer quadrant with 1.6 cm of calcifications and  DCIS. It is ER positive, PR negative, and HER2 positive with a Ki67 of 50%. She smokes about 1 pack/day.She did undergo chemotherapy and is still getting infusions    Patient Stated Goals Pt is going paddleboarding in June    Currently in Pain? Yes    Pain Score 3     Pain Location Neck    Pain Orientation Left    Pain Descriptors / Indicators Other (Comment)   just feels irritable   Pain Type Chronic pain    Pain Radiating Towards scapula and shoulder    Pain Onset More than a month ago    Pain Frequency Intermittent    Aggravating Factors  laying Lt side sometimes but overall just comes and goes    Pain Relieving Factors Tylenol helps mildly                              [facility] Adult PT Treatment/Exercise - 08/04/20 0001      Shoulder Exercises: Supine   Other Supine Exercises Towel roll along thoracic spine for bil UE horz abd x10, then scaption into a "V" x5 with VCs to decrease scapular compensation on Lt as she was having pain here but this resolved once technqiue was corrected      Manual Therapy   Soft tissue mobilization In Supine to Rt pectoralis origin with cocoa butter working at each intercostal space to alleviate tightness and tendnerness pt is experiencing here    Myofascial Release across medial chest incisions with cross hands technique horizontally , and then vertically and diagonolly at Rt chest    Manual Lymphatic Drainage (MLD) Lt inguinal nodes, Lt axillo-inguinal anastomosis, stationary circles at anterior chest, then same to Rt side    Passive ROM In Supine to Rt shoulder into flexion, abduction and D2 then same to Lt shoulder                       PT Long Term Goals - 07/16/20 1248      PT LONG TERM GOAL #1   Title Pt will decrease Quick DASH score to < 20 indicating an improvment in shoudler function    Baseline 40.91 on 07/16/220    Time 6    Period Weeks    Status New      PT LONG TERM GOAL #2   Title Pt will increase grip strength to > 40 pounds in right hand indicating an improvment in UE functional strength    Baseline 35 pounds on 07/16/2020    Time 6    Period Weeks    Status New      PT LONG TERM GOAL #3   Title Pt will increase # of reps of sit to stand in 30 seconds to 13 with RPE of 5/10 indicating an improvment in general functional strength    Baseline 11 in 30 sec with RPE of 9/10 on 07/16/2020    Time 6    Period Weeks    Status New      PT LONG TERM GOAL #4   Title Pt will decrease TUG score to < 7 sec demonstrating a decreased risk for fall    Baseline 7.92 on 07/16/2020    Time 6    Period Weeks    Status New                 Plan - 08/04/20  1217  Clinical Impression Statement Pt is starting to see some mild improvement at chest as far as edema is less now at medial aspects around sternum, and reaching across body (adduction) was painful but pt reports able to do this now. Continued with manual therapy and focused more today at origin of Rt pectoralis where pt c/o most tightness and tenderness. Also progressed pt to include A/ROM of bil shoulders over towel roll along thoracic spine which she reported feeling good stretches with though did require VCs to decrease scapular compensations, especially with Lt as pt was reporting pain initially which resolved when she corrected her technique. She is going to cont these at home.    Personal Factors and Comorbidities Comorbidity 3+    Comorbidities bil masectomy with 3 lymph node removal on the R, chemotherapy, ongoing infusions    Examination-Activity Limitations Squat;Lift;Caring for Others;Reach Overhead    Examination-Participation Restrictions Occupation;Community Activity    Rehab Potential Good    PT Frequency 2x / week    PT Duration 6 weeks    PT Treatment/Interventions Therapeutic activities;Therapeutic exercise;Neuromuscular re-education;Manual techniques;Cryotherapy;Moist Heat;ADLs/Self Care Home Management;Manual lymph drainage;Passive range of motion;Dry needling;Taping;Patient/family education;Functional mobility training    PT Next Visit Plan Cont manual work to painful areas at Rt>Lt chest and right scapula with gentle rib mobilization, consider taping to full area on chest?  Being progressive load resistive exercise program including circut training    PT Home Exercise Plan Supine core stabs    Consulted and Agree with Plan of Care Patient           Patient will benefit from skilled therapeutic intervention in order to improve the following deficits and impairments:  Decreased knowledge of precautions,Pain,Decreased range of motion,Increased edema  Visit  Diagnosis: Abnormal posture  Muscle weakness (generalized)  Right shoulder pain, unspecified chronicity     Problem List Patient Active Problem List   Diagnosis Date Noted  . Neck pain on left side 06/02/2020  . Chronic left-sided low back pain with left-sided sciatica 06/02/2020  . Paresthesia 05/13/2020  . Malignant neoplasm of right female breast ([place]) 05/13/2020  . Pulmonary emphysema ([place]) 04/15/2020  . Current smoker 04/15/2020  . Other allergy status, other than to drugs and biological substances 04/15/2020  . Pure hypercholesterolemia 04/15/2020  . Skin sensation disturbance 04/15/2020  . Spasm 04/15/2020  . Osteoporosis 03/12/2020  . Acquired absence of breast and absent nipple, bilateral 10/07/2019  . Breast wound, right, initial encounter 10/07/2019  . Breast cancer of upper-outer quadrant of right female breast ([place]) 09/05/2019  . Genetic testing 08/12/2019  . Family history of BRCA gene mutation 07/31/2019  . Family history of breast cancer   . Family history of prostate cancer   . Family history of bladder cancer   . Malignant neoplasm of upper-outer quadrant of right breast in female, estrogen receptor positive ([place]) 07/24/2019  . Breast cancer ([place]) 07/18/2019  . Annual physical exam 03/31/2016  . Occupational bronchitis ([place]) 03/03/2015  . Hyperlipidemia 03/30/2014  . 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 Generalized osteoarthritis 02/23/2012  . Plantar fasciitis, bilateral 02/23/2012  . Vitiligo 02/23/2012  . Difficulty hearing 02/13/2012  . History of lupus 12/21/2011  . Vitamin D deficiency 10/11/2010  . Hypothyroidism, unspecified 06/21/2010  . Lupus erythematosus 06/14/2010    Otelia Limes, PTA 08/04/2020, 12:30 PM  [place], [place], [address] Phone: [phone]   Fax:  [phone]  Name: Stephanie Chase MRN: [id number] Date of Birth: 07-Aug-1960

## 2020-08-05 ENCOUNTER — Encounter: Payer: Self-pay | Admitting: Hematology & Oncology

## 2020-08-09 ENCOUNTER — Ambulatory Visit: Payer: BC Managed Care – PPO

## 2020-08-09 ENCOUNTER — Other Ambulatory Visit: Payer: Self-pay

## 2020-08-09 DIAGNOSIS — R293 Abnormal posture: Secondary | ICD-10-CM

## 2020-08-09 DIAGNOSIS — M25511 Pain in right shoulder: Secondary | ICD-10-CM

## 2020-08-09 DIAGNOSIS — M6281 Muscle weakness (generalized): Secondary | ICD-10-CM

## 2020-08-09 NOTE — Therapy (Signed)
[place], [place], [address] Phone: [phone]   Fax:  [phone]  Physical Therapy Treatment  Patient Details  Name: Stephanie Chase MRN: [id number] Date of Birth: March 10, 1961 Referring Provider (PT): Matthw Scheeler   Encounter Date: 08/09/2020   PT End of Session - 08/09/20 1405    Visit Number 6    Number of Visits 13    Date for PT Re-Evaluation 09/24/20    PT Start Time 8338    PT Stop Time 1405    PT Time Calculation (min) 59 min    Activity Tolerance Patient tolerated treatment well    Behavior During Therapy [facility] for tasks assessed/performed           Past Medical History:  Diagnosis Date  . Anemia   . Anxiety   . Asthma   . Breast cancer ([place])    right, 05/13/20 completed Taxol, on Herceptin  . Cervical paraspinous muscle spasm   . COPD (chronic obstructive pulmonary disease) (HCC)    emphysema  . Discoid lupus   . Discoid lupus   . Family history of bladder cancer   . Family history of BRCA gene mutation   . Family history of breast cancer   . Family history of prostate cancer   . GERD (gastroesophageal reflux disease)   . Hypercholesteremia   . Hypothyroidism   . Neuropathy   . Pneumonia   . Thyroid disease     Past Surgical History:  Procedure Laterality Date  . ABLATION ON ENDOMETRIOSIS  2016  . APPLICATION OF A-CELL OF EXTREMITY Right 10/22/2019   Procedure: EXCISION OF RIGHT BREAST WOUND WITH PRIMARY CLOSURE;  Surgeon: [name] Going, DO;  Location: [place];  Service: Plastics;  Laterality: Right;  . BREAST LUMPECTOMY    . DEBRIDEMENT AND CLOSURE WOUND Right 10/22/2019   Procedure: Excision of right breast wound;  Surgeon: [name] Going, DO;  Location: [place];  Service: Plastics;  Laterality: Right;  . MASTECTOMY W/ SENTINEL NODE BIOPSY Bilateral 09/05/2019   Procedure: BILATERAL MASTECTOMY WITH RIGHT SENTINEL LYMPH NODE BIOPSY;  Surgeon: Stark Klein, MD;   Location: [place];  Service: General;  Laterality: Bilateral;  COMBINED WITH REGIONAL FOR POST OP PAIN  . PORTACATH PLACEMENT Left 09/05/2019   Procedure: INSERTION PORT-A-CATH WITH ULTRASOUND GUIDANCE;  Surgeon: Stark Klein, MD;  Location: [place];  Service: General;  Laterality: Left;  . TONSILLECTOMY    . TUBAL LIGATION    . WISDOM TOOTH EXTRACTION      There were no vitals filed for this visit.   Subjective Assessment - 08/09/20 1310    Subjective I sold my paddleboard this weekend becuase I don't know if I 'm going to get to use it anytime soon and it was just taking up space. My neck feels really tight today with turning my head side to side. I do think we are making progress with my Rt chest tightness, it's just not consistent. I can reach down or away from my body and I'll get this pain that shoots and feels like my muscle is locking up.    Pertinent History Bil masectomy 09/05/19 with 3 lymph nodes removed and negative on the R and benign tisuse on the L. Pt had to ago for another debridement on August 28 by Dr. Marla Roe due to delayed healing and necrosis. Pt did not have radiation  Patient was diagnosed on 06/09/2019 with right grade III invasive ductal carcinoma breast cancer. It  mesures 1.3 cm in the upper outer quadrant with 1.6 cm of calcifications and DCIS. It is ER positive, PR negative, and HER2 positive with a Ki67 of 50%. She smokes about 1 pack/day.She did undergo chemotherapy and is still getting infusions    Patient Stated Goals Pt is going paddleboarding in June    Currently in Pain? No/denies                             [facility] Adult PT Treatment/Exercise - 08/09/20 0001      Manual Therapy   Soft tissue mobilization In Supine to Rt pectoralis inferior to incision with cocoa butter, then along Rt lats in Lt SL and then focused on medial scapular border where pt trigger points that were released. Pt was reporting "clicking" with backward shoulder rolls and  reports this gone after STM here    Myofascial Release across medial chest incisions with cross hands technique horizontally , and then vertically and diagonolly at Rt chest    Scapular Mobilization In Lt S/L for protraction    Manual Lymphatic Drainage (MLD) Rt inguinal nodes, Rt axillo-inguinal anastomosis    Passive ROM In Supine to Rt shoulder into flexion, abduction and D2; then briefly at end into Lt cervical side bending and rotation with STM to Rt upper trap                       PT Long Term Goals - 07/16/20 1248      PT LONG TERM GOAL #1   Title Pt will decrease Quick DASH score to < 20 indicating an improvment in shoudler function    Baseline 40.91 on 07/16/220    Time 6    Period Weeks    Status New      PT LONG TERM GOAL #2   Title Pt will increase grip strength to > 40 pounds in right hand indicating an improvment in UE functional strength    Baseline 35 pounds on 07/16/2020    Time 6    Period Weeks    Status New      PT LONG TERM GOAL #3   Title Pt will increase # of reps of sit to stand in 30 seconds to 13 with RPE of 5/10 indicating an improvment in general functional strength    Baseline 11 in 30 sec with RPE of 9/10 on 07/16/2020    Time 6    Period Weeks    Status New      PT LONG TERM GOAL #4   Title Pt will decrease TUG score to < 7 sec demonstrating a decreased risk for fall    Baseline 7.92 on 07/16/2020    Time 6    Period Weeks    Status New                 Plan - 08/09/20 1408    Clinical Impression Statement Continued with focus on manual therapy to Rt anterior and posterior upper quadrant. Pt had noticed "clicking" all weekend with backward shoulder rolls and repotrs tried stretching in doorway all weekend. This was gone after STM and trigger point relase at medial scapular border near top corner angle of scapula. Also brief STM to Rt upper trap with cervical stretching. {t repots good relief of Rt side tightness at end of  session.    Personal Factors and Comorbidities Comorbidity 3+    Comorbidities bil masectomy  with 3 lymph node removal on the R, chemotherapy, ongoing infusions    Examination-Activity Limitations Squat;Lift;Caring for Others;Reach Overhead    Examination-Participation Restrictions Occupation;Community Activity    Stability/Clinical Decision Making Evolving/Moderate complexity    Rehab Potential Good    PT Frequency 2x / week    PT Duration 6 weeks    PT Treatment/Interventions Therapeutic activities;Therapeutic exercise;Neuromuscular re-education;Manual techniques;Cryotherapy;Moist Heat;ADLs/Self Care Home Management;Manual lymph drainage;Passive range of motion;Dry needling;Taping;Patient/family education;Functional mobility training    PT Next Visit Plan Consider taping for full area on Rt chest? Cont manual work to painful areas at Rt>Lt  chest and focus on trigger point release to bil medial scpaular borders; gentle rib mobilization, Begin progressive load resistive exercise program including circut training    PT Home Exercise Plan Supine core stabs    Consulted and Agree with Plan of Care Patient           Patient will benefit from skilled therapeutic intervention in order to improve the following deficits and impairments:  Decreased knowledge of precautions,Pain,Decreased range of motion,Increased edema  Visit Diagnosis: Abnormal posture  Muscle weakness (generalized)  Right shoulder pain, unspecified chronicity     Problem List Patient Active Problem List   Diagnosis Date Noted  . Neck pain on left side 06/02/2020  . Chronic left-sided low back pain with left-sided sciatica 06/02/2020  . Paresthesia 05/13/2020  . Malignant neoplasm of right female breast ([place]) 05/13/2020  . Pulmonary emphysema ([place]) 04/15/2020  . Current smoker 04/15/2020  . Other allergy status, other than to drugs and biological substances 04/15/2020  . Pure hypercholesterolemia 04/15/2020  . Skin  sensation disturbance 04/15/2020  . Spasm 04/15/2020  . Osteoporosis 03/12/2020  . Acquired absence of breast and absent nipple, bilateral 10/07/2019  . Breast wound, right, initial encounter 10/07/2019  . Breast cancer of upper-outer quadrant of right female breast ([employer]) 09/05/2019  . Genetic testing 08/12/2019  . Family history of BRCA gene mutation 07/31/2019  . Family history of breast cancer   . Family history of prostate cancer   . Family history of bladder cancer   . Malignant neoplasm of upper-outer quadrant of right breast in female, estrogen receptor positive ([place]) 07/24/2019  . Breast cancer ([place]) 07/18/2019  . Annual physical exam 03/31/2016  . Occupational bronchitis ([place]) 03/03/2015  . Hyperlipidemia 03/30/2014  . 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 Generalized osteoarthritis 02/23/2012  . Plantar fasciitis, bilateral 02/23/2012  . Vitiligo 02/23/2012  . Difficulty hearing 02/13/2012  . History of lupus 12/21/2011  . Vitamin D deficiency 10/11/2010  . Hypothyroidism, unspecified 06/21/2010  . Lupus erythematosus 06/14/2010    Otelia Limes, PTA 08/09/2020, 2:12 PM  [place], [place], [address] Phone: [phone]   Fax:  [phone]  Name: Stephanie Chase MRN: [id number] Date of Birth: February 14, 1961

## 2020-08-12 ENCOUNTER — Ambulatory Visit: Payer: BC Managed Care – PPO

## 2020-08-12 ENCOUNTER — Other Ambulatory Visit: Payer: Self-pay

## 2020-08-12 DIAGNOSIS — R293 Abnormal posture: Secondary | ICD-10-CM

## 2020-08-12 DIAGNOSIS — M25511 Pain in right shoulder: Secondary | ICD-10-CM

## 2020-08-12 DIAGNOSIS — M6281 Muscle weakness (generalized): Secondary | ICD-10-CM

## 2020-08-12 NOTE — Therapy (Signed)
[place], [place], [address] Phone: [phone]   Fax:  [phone]  Physical Therapy Treatment  Patient Details  Name: Stephanie Chase MRN: [id number] Date of Birth: October 10, 1960 Referring Provider (PT): Matthw Scheeler   Encounter Date: 08/12/2020   PT End of Session - 08/12/20 1210    Visit Number 7    Number of Visits 13    Date for PT Re-Evaluation 09/24/20    PT Start Time 1106    PT Stop Time 1208    PT Time Calculation (min) 62 min    Activity Tolerance Patient tolerated treatment well    Behavior During Therapy [facility] for tasks assessed/performed           Past Medical History:  Diagnosis Date  . Anemia   . Anxiety   . Asthma   . Breast cancer ([facility])    right, 05/13/20 completed Taxol, on Herceptin  . Cervical paraspinous muscle spasm   . COPD (chronic obstructive pulmonary disease) (H[facility])    emphysema  . Discoid lupus   . Discoid lupus   . Family history of bladder cancer   . Family history of BRCA gene mutation   . Family history of breast cancer   . Family history of prostate cancer   . GERD (gastroesophageal reflux disease)   . Hypercholesteremia   . Hypothyroidism   . Neuropathy   . Pneumonia   . Thyroid disease     Past Surgical History:  Procedure Laterality Date  . ABLATION ON ENDOMETRIOSIS  2016  . APPLICATION OF A-CELL OF EXTREMITY Right 10/22/2019   Procedure: EXCISION OF RIGHT BREAST WOUND WITH PRIMARY CLOSURE;  Surgeon: [name] Going, DO;  Location: [facility];  Service: Plastics;  Laterality: Right;  . BREAST LUMPECTOMY    . DEBRIDEMENT AND CLOSURE WOUND Right 10/22/2019   Procedure: Excision of right breast wound;  Surgeon: [name] Going, DO;  Location: [facility];  Service: Plastics;  Laterality: Right;  . MASTECTOMY W/ SENTINEL NODE BIOPSY Bilateral 09/05/2019   Procedure: BILATERAL MASTECTOMY WITH RIGHT SENTINEL LYMPH NODE BIOPSY;  Surgeon: Stark Klein, MD;   Location: [facility];  Service: General;  Laterality: Bilateral;  COMBINED WITH REGIONAL FOR POST OP PAIN  . PORTACATH PLACEMENT Left 09/05/2019   Procedure: INSERTION PORT-A-CATH WITH ULTRASOUND GUIDANCE;  Surgeon: Stark Klein, MD;  Location: [facility];  Service: General;  Laterality: Left;  . TONSILLECTOMY    . TUBAL LIGATION    . WISDOM TOOTH EXTRACTION      There were no vitals filed for this visit.   Subjective Assessment - 08/12/20 1113    Subjective I tried the tennis ball in a sock over my shoulder against the wall like you showed me and I brought it today so you can tell me if I'm doing it right. My back has ben spasming lately and I'm not sure why, I even had to take a muscle relaxer the other night to get to sleep.    Pertinent History Bil masectomy 09/05/19 with 3 lymph nodes removed and negative on the R and benign tisuse on the L. Pt had to ago for another debridement on August 28 by Dr. Marla Roe due to delayed healing and necrosis. Pt did not have radiation  Patient was diagnosed on 06/09/2019 with right grade III invasive ductal carcinoma breast cancer. It mesures 1.3 cm in the upper outer quadrant with 1.6 cm of calcifications and DCIS. It is ER positive, PR negative, and  HER2 positive with a Ki67 of 50%. She smokes about 1 pack/day.She did undergo chemotherapy and is still getting infusions    Patient Stated Goals Pt is going paddleboarding in June    Currently in Pain? Yes    Pain Score 4     Pain Location Back    Pain Orientation Mid    Pain Descriptors / Indicators Dull;Aching    Pain Type Chronic pain    Pain Radiating Towards inferior angles of scapula to low-mid back    Pain Onset More than a month ago    Pain Frequency Intermittent    Aggravating Factors  not sure, back pain/spasm has just been coming more frequently than it was; posture when sitting at computer all day for work though is probably not helping    Pain Relieving Factors muscle relaxers                              [facility] Adult PT Treatment/Exercise - 08/12/20 0001      Shoulder Exercises: Supine   Other Supine Exercises All supine scapular series with red therabnd done 5x each over half bolster roll and cuing for core engaged throughout for stability      Shoulder Exercises: Standing   Horizontal ABduction Strengthening;Both;5 reps;Theraband    Theraband Level (Shoulder Horizontal ABduction) Level 2 (Red)    Horizontal ABduction Limitations all standing exs done with back against wall/core engaged and head/shoulders against wall and pt returned therapist demo of each    External Rotation Strengthening;Both;5 reps;Theraband    Theraband Level (Shoulder External Rotation) Level 2 (Red)    Flexion Strengthening;Both;5 reps;Theraband   Narrow and Wide Grip, 5x each   Theraband Level (Shoulder Flexion) Level 2 (Red)    Flexion Limitations only done to shoulder height for both to decr pts c/o LBP/discomfort    Diagonals Strengthening;Right;Left;5 reps;Theraband    Theraband Level (Shoulder Diagonals) Level 2 (Red)      Shoulder Exercises: Pulleys   Flexion 2 minutes    Flexion Limitations VCs to remind pt of proper technique    ABduction 2 minutes      Shoulder Exercises: Therapy Ball   Flexion Both;10 reps   forward lean into end of stretch   ABduction Right;Left;5 reps   same side lean into end of stretch     Shoulder Exercises: Music therapist Stretch 5 reps;20 seconds   in Administrator, arts Limitations returned therapist demo    Other Shoulder Stretches Modified downward dog on wall      Manual Therapy   Soft tissue mobilization In Supine to Rt pectoralis inferior to incision, then along Rt medial scapular border, much less tightness and no trigger points palpable here today; also briefly along medial upper arm where pt repotrs she's has tightness for 3 months    Myofascial Release across medial chest incisions with cross hands technique  horizontally , and then vertically and diagonolly at Rt chest    Scapular Mobilization In Lt S/L for Rt protraction and retraction, mobility here much improved today    Manual Lymphatic Drainage (MLD) Rt inguinal nodes, Rt axillo-inguinal anastomosis during STM    Passive ROM In Supine to Rt shoulder into flexion and abduction                       PT Long Term Goals - 07/16/20 1248      PT  LONG TERM GOAL #1   Title Pt will decrease Quick DASH score to < 20 indicating an improvment in shoudler function    Baseline 40.91 on 07/16/220    Time 6    Period Weeks    Status New      PT LONG TERM GOAL #2   Title Pt will increase grip strength to > 40 pounds in right hand indicating an improvment in UE functional strength    Baseline 35 pounds on 07/16/2020    Time 6    Period Weeks    Status New      PT LONG TERM GOAL #3   Title Pt will increase # of reps of sit to stand in 30 seconds to 13 with RPE of 5/10 indicating an improvment in general functional strength    Baseline 11 in 30 sec with RPE of 9/10 on 07/16/2020    Time 6    Period Weeks    Status New      PT LONG TERM GOAL #4   Title Pt will decrease TUG score to < 7 sec demonstrating a decreased risk for fall    Baseline 7.92 on 07/16/2020    Time 6    Period Weeks    Status New                 Plan - 08/12/20 1216    Clinical Impression Statement Progressed pt to include AA/ROM and postural strengthening. She required cuing for correct technique and to keep core engaged with therbaand activities to lessen discomfort she was feeling at her low back. Reviewed how she was using her tennis ball in sock against wall for self STM at medial scapular borders. Educated her to adjust her pressure and try to lean less and try only 1-2x/day a few days to see if this lessened increased soreness she reported feeling. Then continued manual therapy as done before. Improved Rt scapular mobility noted along with improved end  bil shoulder P/ROMs. Pt still c/o pain at Rt medial upper arm so will cont to focus on this.    Personal Factors and Comorbidities Comorbidity 3+    Comorbidities bil masectomy with 3 lymph node removal on the R, chemotherapy, ongoing infusions    Examination-Activity Limitations Squat;Lift;Caring for Others;Reach Overhead    Examination-Participation Restrictions Occupation;Community Activity    Stability/Clinical Decision Making Evolving/Moderate complexity    Rehab Potential Good    PT Frequency 2x / week    PT Duration 6 weeks    PT Treatment/Interventions Therapeutic activities;Therapeutic exercise;Neuromuscular re-education;Manual techniques;Cryotherapy;Moist Heat;ADLs/Self Care Home Management;Manual lymph drainage;Passive range of motion;Dry needling;Taping;Patient/family education;Functional mobility training    PT Next Visit Plan Consider taping for full area on Rt chest? Cont manual work to painful areas at Rt>Lt  chest and focus on trigger point release to bil medial scpaular borders; gentle rib mobilization, Begin progressive load resistive exercise program including circut training    PT Home Exercise Plan Supine core stabs, resume supine (or standing against wall) scapular series    Consulted and Agree with Plan of Care Patient           Patient will benefit from skilled therapeutic intervention in order to improve the following deficits and impairments:  Decreased knowledge of precautions,Pain,Decreased range of motion,Increased edema  Visit Diagnosis: Abnormal posture  Muscle weakness (generalized)  Right shoulder pain, unspecified chronicity     Problem List Patient Active Problem List   Diagnosis Date Noted  . Neck pain on left  side 06/02/2020  . Chronic left-sided low back pain with left-sided sciatica 06/02/2020  . Paresthesia 05/13/2020  . Malignant neoplasm of right female breast ([facility]) 05/13/2020  . Pulmonary emphysema ([facility]) 04/15/2020  . Current smoker  04/15/2020  . Other allergy status, other than to drugs and biological substances 04/15/2020  . Pure hypercholesterolemia 04/15/2020  . Skin sensation disturbance 04/15/2020  . Spasm 04/15/2020  . Osteoporosis 03/12/2020  . Acquired absence of breast and absent nipple, bilateral 10/07/2019  . Breast wound, right, initial encounter 10/07/2019  . Breast cancer of upper-outer quadrant of right female breast ([facility]) 09/05/2019  . Genetic testing 08/12/2019  . Family history of BRCA gene mutation 07/31/2019  . Family history of breast cancer   . Family history of prostate cancer   . Family history of bladder cancer   . Malignant neoplasm of upper-outer quadrant of right breast in female, estrogen receptor positive ([facility]) 07/24/2019  . Breast cancer ([facility]) 07/18/2019  . Annual physical exam 03/31/2016  . Occupational bronchitis ([facility]) 03/03/2015  . Hyperlipidemia 03/30/2014  . 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 Generalized osteoarthritis 02/23/2012  . Plantar fasciitis, bilateral 02/23/2012  . Vitiligo 02/23/2012  . Difficulty hearing 02/13/2012  . History of lupus 12/21/2011  . Vitamin D deficiency 10/11/2010  . Hypothyroidism, unspecified 06/21/2010  . Lupus erythematosus 06/14/2010    Otelia Limes, PTA 08/12/2020, 12:22 PM  [place], [place], [address] Phone: [phone]   Fax:  [phone]  Name: Stephanie Chase MRN: [id number] Date of Birth: Aug 18, 1960

## 2020-08-17 ENCOUNTER — Other Ambulatory Visit: Payer: Self-pay | Admitting: Hematology

## 2020-08-17 ENCOUNTER — Other Ambulatory Visit: Payer: Self-pay | Admitting: Hematology & Oncology

## 2020-08-18 ENCOUNTER — Encounter: Payer: BC Managed Care – PPO | Admitting: Physical Therapy

## 2020-08-19 ENCOUNTER — Encounter: Payer: Self-pay | Admitting: Hematology

## 2020-08-19 ENCOUNTER — Encounter: Payer: Self-pay | Admitting: Hematology & Oncology

## 2020-08-20 ENCOUNTER — Encounter: Payer: BC Managed Care – PPO | Admitting: Physical Therapy

## 2020-08-24 ENCOUNTER — Inpatient Hospital Stay: Payer: BC Managed Care – PPO

## 2020-08-24 ENCOUNTER — Encounter: Payer: Self-pay | Admitting: Hematology & Oncology

## 2020-08-24 ENCOUNTER — Inpatient Hospital Stay (HOSPITAL_BASED_OUTPATIENT_CLINIC_OR_DEPARTMENT_OTHER): Payer: BC Managed Care – PPO | Admitting: Hematology & Oncology

## 2020-08-24 ENCOUNTER — Other Ambulatory Visit: Payer: Self-pay

## 2020-08-24 DIAGNOSIS — Z170 Estrogen receptor positive status [ER+]: Secondary | ICD-10-CM

## 2020-08-24 DIAGNOSIS — C50411 Malignant neoplasm of upper-outer quadrant of right female breast: Secondary | ICD-10-CM

## 2020-08-24 DIAGNOSIS — Z5112 Encounter for antineoplastic immunotherapy: Secondary | ICD-10-CM | POA: Diagnosis not present

## 2020-08-24 DIAGNOSIS — M818 Other osteoporosis without current pathological fracture: Secondary | ICD-10-CM

## 2020-08-24 LAB — CMP (CANCER CENTER ONLY)
ALT: 20 U/L (ref 0–44)
AST: 22 U/L (ref 15–41)
Albumin: 4.4 g/dL (ref 3.5–5.0)
Alkaline Phosphatase: 111 U/L (ref 38–126)
Anion gap: 7 (ref 5–15)
BUN: 12 mg/dL (ref 6–20)
CO2: 26 mmol/L (ref 22–32)
Calcium: 10.8 mg/dL — ABNORMAL HIGH (ref 8.9–10.3)
Chloride: 104 mmol/L (ref 98–111)
Creatinine: 0.64 mg/dL (ref 0.44–1.00)
GFR, Estimated: >60 mL/min (ref >60)
Glucose, Bld: 81 mg/dL (ref 70–99)
Potassium: 4.1 mmol/L (ref 3.5–5.1)
Sodium: 137 mmol/L (ref 135–145)
Total Bilirubin: 0.3 mg/dL (ref 0.3–1.2)
Total Protein: 7.2 g/dL (ref 6.5–8.1)

## 2020-08-24 LAB — CBC WITH DIFFERENTIAL (CANCER CENTER ONLY)
Abs Immature Granulocytes: 0.03 10*3/uL (ref 0.00–0.07)
Basophils Absolute: 0 10*3/uL (ref 0.0–0.1)
Basophils Relative: 1 %
Eosinophils Absolute: 0 10*3/uL (ref 0.0–0.5)
Eosinophils Relative: 0 %
HCT: 41.3 % (ref 36.0–46.0)
Hemoglobin: 14.1 g/dL (ref 12.0–15.0)
Immature Granulocytes: 0 %
Lymphocytes Relative: 24 %
Lymphs Abs: 2.1 10*3/uL (ref 0.7–4.0)
MCH: 28.8 pg (ref 26.0–34.0)
MCHC: 34.1 g/dL (ref 30.0–36.0)
MCV: 84.5 fL (ref 80.0–100.0)
Monocytes Absolute: 0.6 10*3/uL (ref 0.1–1.0)
Monocytes Relative: 7 %
Neutro Abs: 5.9 10*3/uL (ref 1.7–7.7)
Neutrophils Relative %: 68 %
Platelet Count: 279 10*3/uL (ref 150–400)
RBC: 4.89 MIL/uL (ref 3.87–5.11)
RDW: 13.7 % (ref 11.5–15.5)
WBC Count: 8.7 10*3/uL (ref 4.0–10.5)
nRBC: 0 % (ref 0.0–0.2)

## 2020-08-24 LAB — LACTATE DEHYDROGENASE: LDH: 195 U/L — ABNORMAL HIGH (ref 98–192)

## 2020-08-24 MED ORDER — HEPARIN SOD (PORK) LOCK FLUSH 100 UNIT/ML IV SOLN
500.0000 [IU] | Freq: Once | INTRAVENOUS | Status: DC | PRN
  Filled 2020-08-24: qty 5

## 2020-08-24 MED ORDER — ACETAMINOPHEN 325 MG PO TABS: 650.0000 mg | ORAL_TABLET | Freq: Once | ORAL | Status: DC

## 2020-08-24 MED ORDER — ACETAMINOPHEN 325 MG PO TABS
650.0000 mg | ORAL_TABLET | Freq: Once | ORAL | Status: AC
Start: 2020-08-24 — End: 2020-08-24
  Administered 2020-08-24: 650 mg via ORAL

## 2020-08-24 MED ORDER — SODIUM CHLORIDE 0.9 % IV SOLN
Freq: Once | INTRAVENOUS | Status: AC
  Filled 2020-08-24: qty 250

## 2020-08-24 MED ORDER — SODIUM CHLORIDE 0.9% FLUSH
10.0000 mL | INTRAVENOUS | Status: DC | PRN
  Filled 2020-08-24: qty 10

## 2020-08-24 MED ORDER — TRASTUZUMAB-QYYP CHEMO 150 MG IV SOLR: 450.0000 mg | Freq: Once | INTRAVENOUS | Status: DC

## 2020-08-24 MED ORDER — DIPHENHYDRAMINE HCL 25 MG PO CAPS: 25.0000 mg | ORAL_CAPSULE | Freq: Once | ORAL | Status: DC

## 2020-08-24 MED ORDER — HEPARIN SOD (PORK) LOCK FLUSH 100 UNIT/ML IV SOLN
500.0000 [IU] | Freq: Once | INTRAVENOUS | Status: DC
  Filled 2020-08-24: qty 5

## 2020-08-24 MED ORDER — TRASTUZUMAB-QYYP CHEMO 150 MG IV SOLR
450.0000 mg | Freq: Once | INTRAVENOUS | Status: AC
  Administered 2020-08-24: 450 mg via INTRAVENOUS
  Filled 2020-08-24: qty 21.43

## 2020-08-24 MED ORDER — HEPARIN SOD (PORK) LOCK FLUSH 100 UNIT/ML IV SOLN
500.0000 [IU] | Freq: Once | INTRAVENOUS | Status: AC
  Administered 2020-08-24: 500 [IU] via INTRAVENOUS
  Filled 2020-08-24: qty 5

## 2020-08-24 MED ORDER — ACETAMINOPHEN 325 MG PO TABS
ORAL_TABLET | ORAL | Status: AC
  Filled 2020-08-24: qty 2

## 2020-08-24 MED ORDER — DIPHENHYDRAMINE HCL 25 MG PO CAPS
ORAL_CAPSULE | ORAL | Status: AC
  Filled 2020-08-24: qty 1

## 2020-08-24 MED ORDER — SODIUM CHLORIDE 0.9% FLUSH
10.0000 mL | INTRAVENOUS | Status: DC | PRN
  Administered 2020-08-24: 10 mL via INTRAVENOUS
  Filled 2020-08-24: qty 10

## 2020-08-24 MED ORDER — DIPHENHYDRAMINE HCL 25 MG PO CAPS
25.0000 mg | ORAL_CAPSULE | Freq: Once | ORAL | Status: AC
  Administered 2020-08-24: 25 mg via ORAL

## 2020-08-24 NOTE — Patient Instructions (Signed)
Implanted Port Insertion, Care After This sheet gives you information about how to care for yourself after your procedure. Your health care provider may also give you more specific instructions. If you have problems or questions, contact your health care provider. What can I expect after the procedure? After the procedure, it is common to have:  Discomfort at the port insertion site.  Bruising on the skin over the port. This should improve over 3-4 days. Follow these instructions at home: Port care  After your port is placed, you will get a manufacturer's information card. The card has information about your port. Keep this card with you at all times.  Take care of the port as told by your health care provider. Ask your health care provider if you or a family member can get training for taking care of the port at home. A home health care nurse may also take care of the port.  Make sure to remember what type of port you have. Incision care  Follow instructions from your health care provider about how to take care of your port insertion site. Make sure you: ? Wash your hands with soap and water before and after you change your bandage (dressing). If soap and water are not available, use hand sanitizer. ? Change your dressing as told by your health care provider. ? Leave stitches (sutures), skin glue, or adhesive strips in place. These skin closures may need to stay in place for 2 weeks or longer. If adhesive strip edges start to loosen and curl up, you may trim the loose edges. Do not remove adhesive strips completely unless your health care provider tells you to do that.  Check your port insertion site every day for signs of infection. Check for: ? Redness, swelling, or pain. ? Fluid or blood. ? Warmth. ? Pus or a bad smell.      Activity  Return to your normal activities as told by your health care provider. Ask your health care provider what activities are safe for you.  Do not  lift anything that is heavier than 10 lb (4.5 kg), or the limit that you are told, until your health care provider says that it is safe. General instructions  Take over-the-counter and prescription medicines only as told by your health care provider.  Do not take baths, swim, or use a hot tub until your health care provider approves. Ask your health care provider if you may take showers. You may only be allowed to take sponge baths.  Do not drive for 24 hours if you were given a sedative during your procedure.  Wear a medical alert bracelet in case of an emergency. This will tell any health care providers that you have a port.  Keep all follow-up visits as told by your health care provider. This is important. Contact a health care provider if:  You cannot flush your port with saline as directed, or you cannot draw blood from the port.  You have a fever or chills.  You have redness, swelling, or pain around your port insertion site.  You have fluid or blood coming from your port insertion site.  Your port insertion site feels warm to the touch.  You have pus or a bad smell coming from the port insertion site. Get help right away if:  You have chest pain or shortness of breath.  You have bleeding from your port that you cannot control. Summary  Take care of the port as told by your   health care provider. Keep the manufacturer's information card with you at all times.  Change your dressing as told by your health care provider.  Contact a health care provider if you have a fever or chills or if you have redness, swelling, or pain around your port insertion site.  Keep all follow-up visits as told by your health care provider. This information is not intended to replace advice given to you by your health care provider. Make sure you discuss any questions you have with your health care provider. Document Revised: 10/09/2017 Document Reviewed: 10/09/2017 Elsevier Patient Education   2021 Elsevier Inc.  

## 2020-08-24 NOTE — Addendum Note (Signed)
Addended by: Randolm Idol on: 08/24/2020 12:51 PM   Modules accepted: Orders

## 2020-08-24 NOTE — Progress Notes (Signed)
Hematology and Oncology Follow Up Visit  Stephanie Chase [id number] Jun 21, 1960 60 y.o. 08/24/2020   Principle Diagnosis:   Stage IA ([id number]) infiltrating ductal carcinoma of the RIGHT breast -- ER+/PR-/HER2+  Osteoporosis-secondary to chemotherapy  Current Therapy:    S/P bilateral mastectomy -- June 2021  S/p Taxol/Herceptin -- completed 12 weeks on 01/26/2020  Herceptin -- maintenance - start 02/10/2020   Prolia 60 mg subcu q. 6 months-first dose on 04/15/2020     Interim History:  Stephanie Chase is back for follow-up.  She did have a wonderful time last week.  She and her boyfriend up and went down to [place].  The absolutely I think started in the [place].  I think she has a sister who lives down there.  [place]anded up in [place].  They went fishing down in the [place] area.  They caught black drum.  The aide the fish.  I am so happy that she enjoyed herself.  She says that her mom is not doing well.  Her mother is up in [place].  She has sounds like widespread metastatic cancer.  She is complaining of some pain and nodularity along the mastectomy site on the right chest wall.  She noticed this about a week ago.  I am not sure exactly what this is.  I have to believe this is going to be scar tissue.  However, she is very worried about this.  She would like to have this evaluated.  I think, outside of going in and doing a biopsy, a PET scan would be reasonable.  We can see if there is any activity where she is complaining of changes.  If there is, then I would get the surgeon to biopsy and see what is going on.  She still has her aches and pains.  She has had some back discomfort.  She is fatigued all the time.  I know she is trying her best at work.  She is motivated incredibly well to do well at  Work.  Of note, her last echocardiogram was back in March.  Her last tumor marker-CA 27.29-was in late March and normal.  Currently, I would say her performance status is  ECOG 1.   Medications:  Current Outpatient Medications:  .  acetaminophen (TYLENOL) 500 MG tablet, Take 500-1,000 mg by mouth every 6 (six) hours as needed for moderate pain. , Disp: , Rfl:  .  albuterol (VENTOLIN HFA) 108 (90 Base) MCG/ACT inhaler, Inhale 2 puffs into the lungs every 6 (six) hours as needed for wheezing or shortness of breath., Disp: 8 g, Rfl: 2 .  ALPRAZolam (XANAX) 0.25 MG tablet, Take 1 tablet (0.25 mg total) by mouth daily as needed for anxiety., Disp: 30 tablet, Rfl: 0 .  baclofen (LIORESAL) 10 MG tablet, TAKE 1 TABLET BY MOUTH TWICE A DAY AS NEEDED FOR MUSCLE SPASM, Disp: 30 tablet, Rfl: 3 .  COVID-19 mRNA Vac-TriS, Pfizer, (PFIZER-BIONT COVID-19 VAC-TRIS) SUSP injection, Inject into the muscle., Disp: 0.3 mL, Rfl: 0 .  Dexlansoprazole (DEXILANT) 30 MG capsule, Take 1 capsule (30 mg total) by mouth daily. (Patient taking differently: Take 30 mg by mouth daily as needed.), Disp: 30 capsule, Rfl: 1 .  diclofenac Sodium (VOLTAREN) 1 % GEL, Apply 2 g topically 4 (four) times daily. (Patient not taking: Reported on 08/03/2020), Disp: 100 g, Rfl: 0 .  DULoxetine (CYMBALTA) 20 MG capsule, TAKE 1 CAPSULE BY MOUTH EVERY DAY, Disp: 90 capsule, Rfl: 1 .  fluticasone (FLONASE) 50  MCG/ACT nasal spray, Place 2 sprays into both nostrils in the morning and at bedtime., Disp: 16 g, Rfl: 5 .  fluticasone (FLOVENT HFA) 110 MCG/ACT inhaler, Inhale 2 puffs into the lungs in the morning and at bedtime., Disp: 1 each, Rfl: 12 .  levothyroxine (SYNTHROID) 75 MCG tablet, Take 75 mcg by mouth daily before breakfast., Disp: , Rfl:  .  lidocaine-prilocaine (EMLA) cream, Apply 1 application topically as needed., Disp: 30 g, Rfl: 1 .  LINZESS 72 MCG capsule, TAKE 1 CAPSULE BY MOUTH DAILY BEFORE BREAKFAST., Disp: 30 capsule, Rfl: 3 .  liothyronine (CYTOMEL) 5 MCG tablet, TAKE 2 TABLETS (=10MCG     TOTAL)     DAILY, Disp: 180 tablet, Rfl: 1 .  ondansetron (ZOFRAN) 8 MG tablet, Take 8 mg by mouth every 8  (eight) hours as needed., Disp: , Rfl:  .  pantoprazole (PROTONIX) 40 MG tablet, Take 40 mg by mouth daily as needed., Disp: , Rfl:  .  sodium fluoride (FLUORISHIELD) 1.1 % GEL dental gel, Sodium Fluoride 5000 Dry Mouth 1.1 % dental paste  USE AS DIRECTED, Disp: , Rfl:  No current facility-administered medications for this visit.  Facility-Administered Medications Ordered in Other Visits:  .  alum & mag hydroxide-simeth (MAALOX/MYLANTA) 200-200-20 MG/5ML suspension 30 mL, 30 mL, Oral, Once, Tanner, Lyndon Code., PA-C  Allergies:  Allergies  Allergen Reactions  . Bactrim [Sulfamethoxazole-Trimethoprim] Itching and Rash    Past Medical History, Surgical history, Social history, and Family History were reviewed and updated.  Review of Systems: Review of Systems  Constitutional: Positive for fatigue.  HENT:   Positive for hearing loss and tinnitus.   Eyes: Positive for eye problems.  Respiratory: Positive for cough.   Cardiovascular: Negative.   Gastrointestinal: Positive for abdominal pain and nausea.  Endocrine: Negative.   Genitourinary: Negative.    Musculoskeletal: Positive for arthralgias and myalgias.  Skin: Negative.   Neurological: Positive for light-headedness.  Hematological: Negative.   Psychiatric/Behavioral: Negative.     Physical Exam:  vitals were not taken for this visit.   Wt Readings from Last 3 Encounters:  08/24/20 175 lb (79.4 kg)  08/03/20 175 lb (79.4 kg)  08/03/20 175 lb (79.4 kg)    Physical Exam Vitals reviewed.  Constitutional:      Comments: She has had bilateral mastectomies.  Mastectomy scars are well-healed.  There might be some nodularity along the mastectomy site.  Again I have to believe this is scar tissue.  There is no erythema or warmth or swelling associated with these.  I cannot palpate any bilateral axillary lymph nodes.  HENT:     Head: Normocephalic and atraumatic.  Eyes:     Pupils: Pupils are equal, round, and reactive to light.   Cardiovascular:     Rate and Rhythm: Normal rate and regular rhythm.     Heart sounds: Normal heart sounds.  Pulmonary:     Effort: Pulmonary effort is normal.     Breath sounds: Normal breath sounds.  Abdominal:     General: Bowel sounds are normal.     Palpations: Abdomen is soft.  Musculoskeletal:        General: No tenderness or deformity. Normal range of motion.     Cervical back: Normal range of motion.  Lymphadenopathy:     Cervical: No cervical adenopathy.  Skin:    General: Skin is warm and dry.     Findings: No erythema or rash.  Neurological:     Mental Status: She  is alert and oriented to person, place, and time.  Psychiatric:        Behavior: Behavior normal.        Thought Content: Thought content normal.        Judgment: Judgment normal.    Lab Results  Component Value Date   WBC 8.7 08/24/2020   HGB 14.1 08/24/2020   HCT 41.3 08/24/2020   MCV 84.5 08/24/2020   PLT 279 08/24/2020     Chemistry      Component Value Date/Time   NA 137 08/24/2020 1112   K 4.1 08/24/2020 1112   CL 104 08/24/2020 1112   CO2 26 08/24/2020 1112   BUN 12 08/24/2020 1112   CREATININE 0.64 08/24/2020 1112   CREATININE 0.66 09/12/2017 0747      Component Value Date/Time   CALCIUM 10.8 (H) 08/24/2020 1112   ALKPHOS 111 08/24/2020 1112   AST 22 08/24/2020 1112   ALT 20 08/24/2020 1112   BILITOT 0.3 08/24/2020 1112      Impression and Plan: Ms. Regner is is a very nice 60 year old postmenopausal female.  She is originally from [place].  She moved down to [facility].  She has 2 grandchildren already.  Her children live on the [place].  She has early stage breast cancer.  Unfortunately, it is HER-2 positive.  Because this, she required chemotherapy with Herceptin.  Because of the chemotherapy, and she has had side effects.  This really has affected her quality of life.    We will have to see what a PET scan can help [place] with.  Hopefully, the PET scan will  show [place] something.  If not, then we will just have to watch.  I know sometimes is hard to GIST sit tight.  I know that Ms. Choung has always been motivated to be proactive.  For right now, we will just plan to have her come back in 3 weeks or so.  I am just happy that she was able to go down to [place] and have a good time.      [name], MD 5/31/202212:39 PM

## 2020-08-24 NOTE — Patient Instructions (Signed)
[facility]  Discharge Instructions: Thank you for choosing [facility] to provide your oncology and hematology care.   If you have a lab appointment with the [facility], please go directly to the [facility] and check in at the registration area.  Wear comfortable clothing and clothing appropriate for easy access to any Portacath or PICC line.   We strive to give you quality time with your provider. You may need to reschedule your appointment if you arrive late (15 or more minutes).  Arriving late affects you and other patients whose appointments are after yours.  Also, if you miss three or more appointments without notifying the office, you may be dismissed from the clinic at the provider's discretion.      For prescription refill requests, have your pharmacy contact our office and allow 72 hours for refills to be completed.    Today you received the following chemotherapy and/or immunotherapy agents herceptin    To help prevent nausea and vomiting after your treatment, we encourage you to take your nausea medication as directed.  BELOW ARE SYMPTOMS THAT SHOULD BE REPORTED IMMEDIATELY: . *FEVER GREATER THAN 100.4 F (38 C) OR HIGHER . *CHILLS OR SWEATING . *NAUSEA AND VOMITING THAT IS NOT CONTROLLED WITH YOUR NAUSEA MEDICATION . *UNUSUAL SHORTNESS OF BREATH . *UNUSUAL BRUISING OR BLEEDING . *URINARY PROBLEMS (pain or burning when urinating, or frequent urination) . *BOWEL PROBLEMS (unusual diarrhea, constipation, pain near the anus) . TENDERNESS IN MOUTH AND THROAT WITH OR WITHOUT PRESENCE OF ULCERS (sore throat, sores in mouth, or a toothache) . UNUSUAL RASH, SWELLING OR PAIN  . UNUSUAL VAGINAL DISCHARGE OR ITCHING   Items with * indicate a potential emergency and should be followed up as soon as possible or go to the Emergency Department if any problems should occur.  Please show the CHEMOTHERAPY ALERT CARD or IMMUNOTHERAPY ALERT CARD  at check-in to the Emergency Department and triage nurse. Should you have questions after your visit or need to cancel or reschedule your appointment, please contact [facility]  [phone] and follow the prompts.  Office hours are 8:00 a.m. to 4:30 p.m. Monday - Friday. Please note that voicemails left after 4:00 p.m. may not be returned until the following business day.  We are closed weekends and major holidays. You have access to a nurse at all times for urgent questions. Please call the main number to the clinic [phone] and follow the prompts.  For any non-urgent questions, you may also contact your provider using MyChart. We now offer e-Visits for anyone 58 and older to request care online for non-urgent symptoms. For details visit [email or web address].   Also download the MyChart app! Go to the app store, search "MyChart", open the app, select [facility], and log in with your MyChart username and password.  Due to Covid, a mask is required upon entering the hospital/clinic. If you do not have a mask, one will be given to you upon arrival. For doctor visits, patients may have 1 support person aged 53 or older with them. For treatment visits, patients cannot have anyone with them due to current Covid guidelines and our immunocompromised population.

## 2020-08-25 ENCOUNTER — Other Ambulatory Visit: Payer: Self-pay | Admitting: Family

## 2020-08-25 ENCOUNTER — Ambulatory Visit: Payer: BC Managed Care – PPO | Attending: Surgical | Admitting: Physical Therapy

## 2020-08-25 ENCOUNTER — Encounter: Payer: Self-pay | Admitting: Physical Therapy

## 2020-08-25 DIAGNOSIS — M25612 Stiffness of left shoulder, not elsewhere classified: Secondary | ICD-10-CM

## 2020-08-25 DIAGNOSIS — R0789 Other chest pain: Secondary | ICD-10-CM

## 2020-08-25 DIAGNOSIS — M25511 Pain in right shoulder: Secondary | ICD-10-CM

## 2020-08-25 DIAGNOSIS — M25611 Stiffness of right shoulder, not elsewhere classified: Secondary | ICD-10-CM

## 2020-08-25 DIAGNOSIS — R293 Abnormal posture: Secondary | ICD-10-CM | POA: Diagnosis present

## 2020-08-25 DIAGNOSIS — M25512 Pain in left shoulder: Secondary | ICD-10-CM

## 2020-08-25 DIAGNOSIS — M6281 Muscle weakness (generalized): Secondary | ICD-10-CM

## 2020-08-25 NOTE — Therapy (Signed)
[place], [place], [address] Phone: [phone]   Fax:  [phone]  Physical Therapy Treatment  Patient Details  Name: Stephanie Chase MRN: [id number] Date of Birth: 1960/11/16 Referring Provider (PT): Matthw Scheeler   Encounter Date: 08/25/2020   PT End of Session - 08/25/20 1514    Visit Number 8    Number of Visits 13    Date for PT Re-Evaluation 09/24/20    PT Start Time 1500    PT Stop Time 1545    PT Time Calculation (min) 45 min    Activity Tolerance Patient tolerated treatment well    Behavior During Therapy [facility] for tasks assessed/performed           Past Medical History:  Diagnosis Date  . Anemia   . Anxiety   . Asthma   . Breast cancer ([place])    right, 05/13/20 completed Taxol, on Herceptin  . Cervical paraspinous muscle spasm   . COPD (chronic obstructive pulmonary disease) (HCC)    emphysema  . Discoid lupus   . Discoid lupus   . Family history of bladder cancer   . Family history of BRCA gene mutation   . Family history of breast cancer   . Family history of prostate cancer   . GERD (gastroesophageal reflux disease)   . Hypercholesteremia   . Hypothyroidism   . Neuropathy   . Pneumonia   . Thyroid disease     Past Surgical History:  Procedure Laterality Date  . ABLATION ON ENDOMETRIOSIS  2016  . APPLICATION OF A-CELL OF EXTREMITY Right 10/22/2019   Procedure: EXCISION OF RIGHT BREAST WOUND WITH PRIMARY CLOSURE;  Surgeon: [name] Going, DO;  Location: [facility];  Service: Plastics;  Laterality: Right;  . BREAST LUMPECTOMY    . DEBRIDEMENT AND CLOSURE WOUND Right 10/22/2019   Procedure: Excision of right breast wound;  Surgeon: [name] Going, DO;  Location: [facility];  Service: Plastics;  Laterality: Right;  . MASTECTOMY W/ SENTINEL NODE BIOPSY Bilateral 09/05/2019   Procedure: BILATERAL MASTECTOMY WITH RIGHT SENTINEL LYMPH NODE BIOPSY;  Surgeon: Stark Klein, MD;   Location: [facility];  Service: General;  Laterality: Bilateral;  COMBINED WITH REGIONAL FOR POST OP PAIN  . PORTACATH PLACEMENT Left 09/05/2019   Procedure: INSERTION PORT-A-CATH WITH ULTRASOUND GUIDANCE;  Surgeon: Stark Klein, MD;  Location: [facility];  Service: General;  Laterality: Left;  . TONSILLECTOMY    . TUBAL LIGATION    . WISDOM TOOTH EXTRACTION      There were no vitals filed for this visit.   Subjective Assessment - 08/25/20 1509    Subjective Pt says she still has the pain in her right arm that goes down to her elbow. And no matter what she does it does not seem to get better.  With in the last week or 2 she has develeped some small bumps on her chest and will be having a PET scan to check those out.  "No matter what I try, I can't get through the fatigue or the pain "  Pt states she has been walking and trying not to take long naps  She says she uses a red theraband for 5 reps of exercise a day about every other and has been using the tennis ball the wall " I have been trying everything"    Pertinent History Bil masectomy 09/05/19 with 3 lymph nodes removed and negative on the R and benign tisuse on the L.  Pt had to ago for another debridement on August 28 by Dr. Marla Roe due to delayed healing and necrosis. Pt did not have radiation  Patient was diagnosed on 06/09/2019 with right grade III invasive ductal carcinoma breast cancer. It mesures 1.3 cm in the upper outer quadrant with 1.6 cm of calcifications and DCIS. It is ER positive, PR negative, and HER2 positive with a Ki67 of 50%. She smokes about 1 pack/day.She did undergo chemotherapy and is still getting infusions    Patient Stated Goals Pt is going paddleboarding in June    Currently in Pain? Yes    Pain Score 6     Pain Location Shoulder    Pain Orientation Right   pain is all over including lower back   Pain Descriptors / Indicators Aching    Pain Type Chronic pain    Pain Radiating Towards shoulder and low back    Pain Onset  More than a month ago    Pain Orientation Right    Pain Descriptors / Indicators Aching    Pain Type Chronic pain    Pain Onset More than a month ago    Pain Frequency Intermittent                             [facility] Adult PT Treatment/Exercise - 08/25/20 0001      Exercises   Other Exercises  encouraged pt to do 150 minutes of moderate exercise a week and 2x strength training per week. encouraged pt to increase theraband exercise one per day til she gets to 10 each. Pt asked about using airdyne bike at home      Manual Therapy   Soft tissue mobilization with cocoa butter in sidelying to posterior shoulder with deep pressure at trigger points there and at upper trap and medial scapula    Myofascial Release across medial chest incisions with cross hands technique horizontally , and then vertically and diagonolly at Rt chest    Passive ROM In Supine to Rt shoulder into flexion and abduction                       PT Long Term Goals - 07/16/20 1248      PT LONG TERM GOAL #1   Title Pt will decrease Quick DASH score to < 20 indicating an improvment in shoudler function    Baseline 40.91 on 07/16/220    Time 6    Period Weeks    Status New      PT LONG TERM GOAL #2   Title Pt will increase grip strength to > 40 pounds in right hand indicating an improvment in UE functional strength    Baseline 35 pounds on 07/16/2020    Time 6    Period Weeks    Status New      PT LONG TERM GOAL #3   Title Pt will increase # of reps of sit to stand in 30 seconds to 13 with RPE of 5/10 indicating an improvment in general functional strength    Baseline 11 in 30 sec with RPE of 9/10 on 07/16/2020    Time 6    Period Weeks    Status New      PT LONG TERM GOAL #4   Title Pt will decrease TUG score to < 7 sec demonstrating a decreased risk for fall    Baseline 7.92 on 07/16/2020    Time 6  Period Weeks    Status New                 Plan - 08/25/20 1607     Clinical Impression Statement Pt reports she receivd some symtomatic relieft from soft tissue work today. Talked to her about dry needling, but she is not sure she wants to try that.  Pt is interested in learning more weight resistance exericse next session    Personal Factors and Comorbidities Comorbidity 3+    Comorbidities bil masectomy with 3 lymph node removal on the R, chemotherapy, ongoing infusions    Examination-Activity Limitations Squat;Lift;Caring for Others;Reach Overhead    Examination-Participation Restrictions Occupation;Community Activity    Clinical Decision Making Moderate    Rehab Potential Good    PT Frequency 2x / week    PT Duration 6 weeks    PT Treatment/Interventions Therapeutic activities;Therapeutic exercise;Neuromuscular re-education;Manual techniques;Cryotherapy;Moist Heat;ADLs/Self Care Home Management;Manual lymph drainage;Passive range of motion;Dry needling;Taping;Patient/family education;Functional mobility training    PT Next Visit Plan teach Strength ABC (at least weight resisted training portion as time allows).  Consider scheduling more visits as cert covers til july.Consider taping for full area on Rt chest? Cont manual work to painful areas at Rt>Lt  chest and focus on trigger point release to bil medial scpaular borders; gentle rib mobilization,    PT Home Exercise Plan Supine core stabs, resume supine (or standing against wall) scapular series    Consulted and Agree with Plan of Care Patient           Patient will benefit from skilled therapeutic intervention in order to improve the following deficits and impairments:  Decreased knowledge of precautions,Pain,Decreased range of motion,Increased edema  Visit Diagnosis: Abnormal posture  Muscle weakness (generalized)  Right shoulder pain, unspecified chronicity  Stiffness of left shoulder, not elsewhere classified  Stiffness of right shoulder, not elsewhere classified  Acute pain of right  shoulder  Acute pain of left shoulder  Sternal pain     Problem List Patient Active Problem List   Diagnosis Date Noted  . Neck pain on left side 06/02/2020  . Chronic left-sided low back pain with left-sided sciatica 06/02/2020  . Paresthesia 05/13/2020  . Malignant neoplasm of right female breast ([place]) 05/13/2020  . Pulmonary emphysema ([place]) 04/15/2020  . Current smoker 04/15/2020  . Other allergy status, other than to drugs and biological substances 04/15/2020  . Pure hypercholesterolemia 04/15/2020  . Skin sensation disturbance 04/15/2020  . Spasm 04/15/2020  . Osteoporosis 03/12/2020  . Acquired absence of breast and absent nipple, bilateral 10/07/2019  . Breast wound, right, initial encounter 10/07/2019  . Breast cancer of upper-outer quadrant of right female breast ([place]) 09/05/2019  . Genetic testing 08/12/2019  . Family history of BRCA gene mutation 07/31/2019  . Family history of breast cancer   . Family history of prostate cancer   . Family history of bladder cancer   . Malignant neoplasm of upper-outer quadrant of right breast in female, estrogen receptor positive ([place]) 07/24/2019  . Breast cancer ([place]) 07/18/2019  . Annual physical exam 03/31/2016  . Occupational bronchitis ([place]) 03/03/2015  . Hyperlipidemia 03/30/2014  . 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 Generalized osteoarthritis 02/23/2012  . Plantar fasciitis, bilateral 02/23/2012  . Vitiligo 02/23/2012  . Difficulty hearing 02/13/2012  . History of lupus 12/21/2011  . Vitamin D deficiency 10/11/2010  . Hypothyroidism, unspecified 06/21/2010  . Lupus erythematosus 06/14/2010   Donato Heinz. Owens Shark, PT  Norwood Levo 08/25/2020, 4:11 PM  [facility]  [place], [place], [address] Phone: [phone]   Fax:  [phone]  Name: Stephanie Chase MRN: [id number] Date of Birth: [birth year]

## 2020-08-26 ENCOUNTER — Ambulatory Visit (HOSPITAL_COMMUNITY)
Admission: RE | Admit: 2020-08-26 | Discharge: 2020-08-26 | Disposition: A | Discharge status: Home / Self Care | Payer: BC Managed Care – PPO | Source: Ambulatory Visit | Attending: Internal Medicine | Admitting: Internal Medicine

## 2020-08-26 ENCOUNTER — Telehealth: Payer: Self-pay | Admitting: *Deleted

## 2020-08-26 ENCOUNTER — Ambulatory Visit (HOSPITAL_BASED_OUTPATIENT_CLINIC_OR_DEPARTMENT_OTHER)
Admission: RE | Admit: 2020-08-26 | Discharge: 2020-08-26 | Disposition: A | Discharge status: Home / Self Care | Payer: BC Managed Care – PPO | Source: Ambulatory Visit

## 2020-08-26 ENCOUNTER — Other Ambulatory Visit: Payer: Self-pay

## 2020-08-26 ENCOUNTER — Encounter: Payer: Self-pay | Admitting: Hematology & Oncology

## 2020-08-26 ENCOUNTER — Encounter (HOSPITAL_COMMUNITY): Payer: Self-pay | Admitting: Internal Medicine

## 2020-08-26 VITALS — BP 112/78 | HR 86 | Wt 173.4 lb

## 2020-08-26 DIAGNOSIS — Z9011 Acquired absence of right breast and nipple: Secondary | ICD-10-CM | POA: Insufficient documentation

## 2020-08-26 DIAGNOSIS — F17210 Nicotine dependence, cigarettes, uncomplicated: Secondary | ICD-10-CM | POA: Diagnosis not present

## 2020-08-26 DIAGNOSIS — C50011 Malignant neoplasm of nipple and areola, right female breast: Secondary | ICD-10-CM

## 2020-08-26 DIAGNOSIS — Z79890 Hormone replacement therapy (postmenopausal): Secondary | ICD-10-CM | POA: Diagnosis not present

## 2020-08-26 DIAGNOSIS — Z5181 Encounter for therapeutic drug level monitoring: Secondary | ICD-10-CM | POA: Insufficient documentation

## 2020-08-26 DIAGNOSIS — C50411 Malignant neoplasm of upper-outer quadrant of right female breast: Secondary | ICD-10-CM

## 2020-08-26 DIAGNOSIS — K219 Gastro-esophageal reflux disease without esophagitis: Secondary | ICD-10-CM | POA: Diagnosis not present

## 2020-08-26 DIAGNOSIS — E039 Hypothyroidism, unspecified: Secondary | ICD-10-CM | POA: Diagnosis not present

## 2020-08-26 DIAGNOSIS — Z720 Tobacco use: Secondary | ICD-10-CM | POA: Diagnosis not present

## 2020-08-26 DIAGNOSIS — Z881 Allergy status to other antibiotic agents status: Secondary | ICD-10-CM | POA: Insufficient documentation

## 2020-08-26 DIAGNOSIS — Z170 Estrogen receptor positive status [ER+]: Secondary | ICD-10-CM | POA: Diagnosis not present

## 2020-08-26 DIAGNOSIS — Z79899 Other long term (current) drug therapy: Secondary | ICD-10-CM | POA: Insufficient documentation

## 2020-08-26 DIAGNOSIS — Z0189 Encounter for other specified special examinations: Secondary | ICD-10-CM | POA: Diagnosis not present

## 2020-08-26 DIAGNOSIS — I5022 Chronic systolic (congestive) heart failure: Secondary | ICD-10-CM | POA: Diagnosis not present

## 2020-08-26 DIAGNOSIS — E785 Hyperlipidemia, unspecified: Secondary | ICD-10-CM | POA: Insufficient documentation

## 2020-08-26 DIAGNOSIS — Z7951 Long term (current) use of inhaled steroids: Secondary | ICD-10-CM | POA: Insufficient documentation

## 2020-08-26 DIAGNOSIS — L930 Discoid lupus erythematosus: Secondary | ICD-10-CM | POA: Diagnosis not present

## 2020-08-26 DIAGNOSIS — J449 Chronic obstructive pulmonary disease, unspecified: Secondary | ICD-10-CM | POA: Diagnosis not present

## 2020-08-26 LAB — ECHOCARDIOGRAM COMPLETE
Area-P 1/2: 3.27 cm2
S' Lateral: 1.9 cm

## 2020-08-26 NOTE — Telephone Encounter (Signed)
Faxed  Disability paperwork to [employer].Informed pt a copy of disability paperwork will be mailed or pt can pick up at office,pt requested to pick up at front desk. Envelope taken to front desk for pt. No further concerns

## 2020-08-26 NOTE — Progress Notes (Signed)
  Echocardiogram 2D Echocardiogram has been performed.  Randa Lynn Farah Benish 08/26/2020, 10:48 AM

## 2020-08-26 NOTE — Progress Notes (Signed)
CARDIO-ONCOLOGY CLINIC NOTE  Referring Physician: Dr. Marin Olp Primary Care: Delsa Bern, MD   HPI:  Stephanie Chase is a 60 y.o. with COPD with ongoing tobacco use, discoid lupus, hypothyroidism, hyperlipidemia, right breast cancer referred by Dr. Burr Medico for enrollment into the Cardio-Oncology program.  Now following with Dr. Marin Olp  She was diagnosed with R breast CA in 4/21. StageIA, pT1cN0M0,ER+/HER2+, PR-,Grade III. 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 Her margins were clear and she was node negative. Her left breast tissue was benign. Given her HER2 positive high grade disease she was started on weeklypaclitaxelandtrastuzumabfor 12 weeks on 10/28/19, followed bymaintenancetrastuzumabq3weeks   She had R breast wound post-op and saw Dr. Marla Roe. Wound has healed but area is still tender.    Here for f/u. Feeling ok. Breathing ok. No CP. No edema orthopnea or PND. Back up to smoking 1 ppd Tolerating herceptin well. 3 months to go. No claudication    Echo today 08/26/20: EF 65-70% G1 DD GLS -20.15 Personally reviewed  Echo 05/26/20: EF 60-65% Grade 2 DD Personally reviewed  Echo 02/26/20: EF 60-65% Grade 1DD Personally reviewed Echo 8/21 EF 60-65% Personally reviewed Echo: 5/21 EF 65-70% GLS -20.4%   Reviewed personally.    Current Outpatient Medications  Medication Sig Dispense Refill  . acetaminophen (TYLENOL) 500 MG tablet Take 500-1,000 mg by mouth every 6 (six) hours as needed for moderate pain.     Marland Kitchen albuterol (VENTOLIN HFA) 108 (90 Base) MCG/ACT inhaler Inhale 2 puffs into the lungs every 6 (six) hours as needed for wheezing or shortness of breath. 8 g 2  . ALPRAZolam (XANAX) 0.25 MG tablet Take 1 tablet (0.25 mg total) by mouth daily as needed for anxiety. 30 tablet 0  .  baclofen (LIORESAL) 10 MG tablet TAKE 1 TABLET BY MOUTH TWICE A DAY AS NEEDED FOR MUSCLE SPASM 30 tablet 3  . Dexlansoprazole (DEXILANT) 30 MG capsule Take 1 capsule (30 mg total) by mouth daily. (Patient taking differently: Take 30 mg by mouth daily as needed.) 30 capsule 1  . DULoxetine (CYMBALTA) 20 MG capsule TAKE 1 CAPSULE BY MOUTH EVERY DAY 90 capsule 1  . fluticasone (FLONASE) 50 MCG/ACT nasal spray Place 2 sprays into both nostrils in the morning and at bedtime. 16 g 5  . fluticasone (FLOVENT HFA) 110 MCG/ACT inhaler Inhale 2 puffs into the lungs in the morning and at bedtime. 1 each 12  . levothyroxine (SYNTHROID) 75 MCG tablet Take 75 mcg by mouth daily before breakfast.    . lidocaine-prilocaine (EMLA) cream Apply 1 application topically as needed. 30 g 1  . LINZESS 72 MCG capsule TAKE 1 CAPSULE BY MOUTH DAILY BEFORE BREAKFAST. 30 capsule 3  . liothyronine (CYTOMEL) 5 MCG tablet TAKE 2 TABLETS (=10MCG     TOTAL)     DAILY 180 tablet 1  . ondansetron (ZOFRAN) 8 MG tablet Take 8 mg by mouth every 8 (eight) hours as needed.    . pantoprazole (PROTONIX) 40 MG tablet Take 40 mg by mouth daily as needed.    . sodium fluoride (FLUORISHIELD) 1.1 % GEL dental gel Sodium Fluoride 5000 Dry Mouth 1.1 % dental paste  USE AS DIRECTED     No current facility-administered medications for this encounter.   Facility-Administered Medications Ordered in Other Encounters  Medication Dose Route Frequency Provider Last  Rate Last Admin  . alum & mag hydroxide-simeth (MAALOX/MYLANTA) 200-200-20 MG/5ML suspension 30 mL  30 mL Oral Once Harle Stanford., PA-C        Allergies  Allergen Reactions  . Bactrim [Sulfamethoxazole-Trimethoprim] Itching and Rash      Vitals:   08/26/20 1108  BP: 112/78  Pulse: 86  SpO2: 97%  Weight: 78.7 kg (173 lb 6.4 oz)    PHYSICAL EXAM: General:  Well appearing. No resp difficulty HEENT: normal Neck: supple. no JVD. Carotids 2+ bilat; no bruits. No lymphadenopathy or  thryomegaly appreciated. Cor: PMI nondisplaced. Regular rate & rhythm. No rubs, gallops or murmurs. Lungs: decreased throughout. No wheeze Abdomen: soft, nontender, nondistended. No hepatosplenomegaly. No bruits or masses. Good bowel sounds. Extremities: no cyanosis, clubbing, rash, edema Neuro: alert & orientedx3, cranial nerves grossly intact. moves all 4 extremities w/o difficulty. Affect pleasant   ASSESSMENT & PLAN:  1. Right Breast Cancer - She was diagnosed with R breast CA in 06/2019. StageIA, pT1cN0M0,ER+/HER2+, PR-,Grade III.  - Echo 5/21 EF 65-70% GLS -20.4% - Echo 11/24/19 EF 60-65% - Echo 02/26/20: EF 60-65% Grade 1DD - Echo 05/26/20: EF 60-65% Grade 2 DD Personally reviewed - Echo today 08/26/20: EF 65-70% G1 DD GLS -20.15 Personally reviewed - I reviewed echos personally. EF and Doppler parameters stable. No HF on exam. Continue Herceptin. Has 3 months to go.   2. Tobacco use - smokes ~1.0 ppd - Discussed cutting back  3. Hyperlipidemia - LDL 142 in 12/21 - followed by PCP - Will need further CV risk stratification. Coronary calcium score ordered at last visit. Not completed yet. Will reschedule   Glori Bickers, MD  11:11 AM

## 2020-08-26 NOTE — Addendum Note (Signed)
Encounter addended by: Scarlette Calico, RN on: 08/26/2020 11:38 AM  Actions taken: Visit diagnoses modified, Order list changed, Diagnosis association updated, Clinical Note Signed

## 2020-08-26 NOTE — Patient Instructions (Signed)
Your physician recommends that you schedule a follow-up appointment in: 3 months with echocardiogram  

## 2020-08-31 ENCOUNTER — Other Ambulatory Visit: Payer: Self-pay | Admitting: Hematology & Oncology

## 2020-08-31 ENCOUNTER — Ambulatory Visit: Payer: BC Managed Care – PPO

## 2020-08-31 ENCOUNTER — Other Ambulatory Visit: Payer: Self-pay

## 2020-08-31 DIAGNOSIS — R293 Abnormal posture: Secondary | ICD-10-CM | POA: Diagnosis not present

## 2020-08-31 DIAGNOSIS — M6281 Muscle weakness (generalized): Secondary | ICD-10-CM

## 2020-08-31 DIAGNOSIS — M25511 Pain in right shoulder: Secondary | ICD-10-CM

## 2020-08-31 NOTE — Therapy (Signed)
[place], [place], [address] Phone: [phone]   Fax:  [phone]  Physical Therapy Treatment  Patient Details  Name: Stephanie Chase MRN: [id number] Date of Birth: June 10, 1960 Referring Provider (PT): Matthw Scheeler   Encounter Date: 08/31/2020   PT End of Session - 08/31/20 1259    Visit Number 9    Number of Visits 13    Date for PT Re-Evaluation 09/24/20    PT Start Time 1058    PT Stop Time 1200    PT Time Calculation (min) 62 min    Activity Tolerance Patient tolerated treatment well    Behavior During Therapy [facility] for tasks assessed/performed           Past Medical History:  Diagnosis Date  . Anemia   . Anxiety   . Asthma   . Breast cancer ([place])    right, 05/13/20 completed Taxol, on Herceptin  . Cervical paraspinous muscle spasm   . COPD (chronic obstructive pulmonary disease) (HCC)    emphysema  . Discoid lupus   . Discoid lupus   . Family history of bladder cancer   . Family history of BRCA gene mutation   . Family history of breast cancer   . Family history of prostate cancer   . GERD (gastroesophageal reflux disease)   . Hypercholesteremia   . Hypothyroidism   . Neuropathy   . Pneumonia   . Thyroid disease     Past Surgical History:  Procedure Laterality Date  . ABLATION ON ENDOMETRIOSIS  2016  . APPLICATION OF A-CELL OF EXTREMITY Right 10/22/2019   Procedure: EXCISION OF RIGHT BREAST WOUND WITH PRIMARY CLOSURE;  Surgeon: [name] Going, DO;  Location: [facility];  Service: Plastics;  Laterality: Right;  . BREAST LUMPECTOMY    . DEBRIDEMENT AND CLOSURE WOUND Right 10/22/2019   Procedure: Excision of right breast wound;  Surgeon: [name] Going, DO;  Location: [facility];  Service: Plastics;  Laterality: Right;  . MASTECTOMY W/ SENTINEL NODE BIOPSY Bilateral 09/05/2019   Procedure: BILATERAL MASTECTOMY WITH RIGHT SENTINEL LYMPH NODE BIOPSY;  Surgeon: Stark Klein, MD;   Location: [facility];  Service: General;  Laterality: Bilateral;  COMBINED WITH REGIONAL FOR POST OP PAIN  . PORTACATH PLACEMENT Left 09/05/2019   Procedure: INSERTION PORT-A-CATH WITH ULTRASOUND GUIDANCE;  Surgeon: Stark Klein, MD;  Location: [facility];  Service: General;  Laterality: Left;  . TONSILLECTOMY    . TUBAL LIGATION    . WISDOM TOOTH EXTRACTION      There were no vitals filed for this visit.   Subjective Assessment - 08/31/20 1102    Subjective My boyfriend and I have been walking a few times a week but Dr. Levin Bacon is going to take me out of work so I'll be able to walk more often first thing in the morning. I'm hoping to be out for the rest of the summer until I finish my chemo treatments because I'm just exhausted and fatigued all the time. The tender spot at my chest is still there and really hasn't improved much. My PET scan to check the new bumps under my skin is 09/21/20. I found a compresion tank top on [employer] that I've been wearing and it feels good on the fullness at my Rt chest.    Pertinent History Bil masectomy 09/05/19 with 3 lymph nodes removed and negative on the R and benign tisuse on the L. Pt had to ago for another debridement on August  28 by Dr. Marla Roe due to delayed healing and necrosis. Pt did not have radiation  Patient was diagnosed on 06/09/2019 with right grade III invasive ductal carcinoma breast cancer. It mesures 1.3 cm in the upper outer quadrant with 1.6 cm of calcifications and DCIS. It is ER positive, PR negative, and HER2 positive with a Ki67 of 50%. She smokes about 1 pack/day.She did undergo chemotherapy and is still getting infusions    Patient Stated Goals Pt is going paddleboarding in June    Currently in Pain? No/denies   low back sore                            [facility] Adult PT Treatment/Exercise - 08/31/20 0001      Manual Therapy   Soft tissue mobilization with cocoa butter in sidelying to posterior shoulder with deep pressure at  trigger points there and at upper trap and medial scapula    Myofascial Release across medial chest incisions with cross hands technique horizontally , and then vertically and diagonolly at Rt chest    Passive ROM In Supine to Rt shoulder into flexion and abduction                       PT Long Term Goals - 07/16/20 1248      PT LONG TERM GOAL #1   Title Pt will decrease Quick DASH score to < 20 indicating an improvment in shoudler function    Baseline 40.91 on 07/16/220    Time 6    Period Weeks    Status New      PT LONG TERM GOAL #2   Title Pt will increase grip strength to > 40 pounds in right hand indicating an improvment in UE functional strength    Baseline 35 pounds on 07/16/2020    Time 6    Period Weeks    Status New      PT LONG TERM GOAL #3   Title Pt will increase # of reps of sit to stand in 30 seconds to 13 with RPE of 5/10 indicating an improvment in general functional strength    Baseline 11 in 30 sec with RPE of 9/10 on 07/16/2020    Time 6    Period Weeks    Status New      PT LONG TERM GOAL #4   Title Pt will decrease TUG score to < 7 sec demonstrating a decreased risk for fall    Baseline 7.92 on 07/16/2020    Time 6    Period Weeks    Status New                 Plan - 08/31/20 1306    Clinical Impression Statement Pt continues to report some relief from tightness after manual therapy at Rt chest wall. Progressed HEP to include Strength ABC Program which pt was instructed in today having her briefly return demo of all strengthening exercises and partial return demo of stretches as time allowed. She was able to verbalize good understanding of "low and slow" progression with weights to keep her risk of lymphedema low after educated about this and returned good demo with correct technqiue. Pt scheduled one more visit for end of the month after her PET scan and may consider D/C at that time.    Personal Factors and Comorbidities Comorbidity 3+     Comorbidities bil masectomy with 3 lymph node  removal on the R, chemotherapy, ongoing infusions    Examination-Activity Limitations Squat;Lift;Caring for Others;Reach Overhead    Examination-Participation Restrictions Occupation;Community Activity    Stability/Clinical Decision Making Evolving/Moderate complexity    Rehab Potential Good    PT Frequency 2x / week    PT Duration 6 weeks    PT Treatment/Interventions Therapeutic activities;Therapeutic exercise;Neuromuscular re-education;Manual techniques;Cryotherapy;Moist Heat;ADLs/Self Care Home Management;Manual lymph drainage;Passive range of motion;Dry needling;Taping;Patient/family education;Functional mobility training    PT Next Visit Plan Review Strength ABC Program; PET scan results? Pt may be ready for D/C next.    PT Home Exercise Plan Supine core stabs, resume supine (or standing against wall) scapular series    Consulted and Agree with Plan of Care Patient           Patient will benefit from skilled therapeutic intervention in order to improve the following deficits and impairments:  Decreased knowledge of precautions,Pain,Decreased range of motion,Increased edema  Visit Diagnosis: Abnormal posture  Muscle weakness (generalized)  Right shoulder pain, unspecified chronicity     Problem List Patient Active Problem List   Diagnosis Date Noted  . Neck pain on left side 06/02/2020  . Chronic left-sided low back pain with left-sided sciatica 06/02/2020  . Paresthesia 05/13/2020  . Malignant neoplasm of right female breast ([place]) 05/13/2020  . Pulmonary emphysema ([place]) 04/15/2020  . Current smoker 04/15/2020  . Other allergy status, other than to drugs and biological substances 04/15/2020  . Pure hypercholesterolemia 04/15/2020  . Skin sensation disturbance 04/15/2020  . Spasm 04/15/2020  . Osteoporosis 03/12/2020  . Acquired absence of breast and absent nipple, bilateral 10/07/2019  . Breast wound, right, initial  encounter 10/07/2019  . Breast cancer of upper-outer quadrant of right female breast ([place]) 09/05/2019  . Genetic testing 08/12/2019  . Family history of BRCA gene mutation 07/31/2019  . Family history of breast cancer   . Family history of prostate cancer   . Family history of bladder cancer   . Malignant neoplasm of upper-outer quadrant of right breast in female, estrogen receptor positive ([place]) 07/24/2019  . Breast cancer ([place]) 07/18/2019  . Annual physical exam 03/31/2016  . Occupational bronchitis ([place]) 03/03/2015  . Hyperlipidemia 03/30/2014  . 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 Generalized osteoarthritis 02/23/2012  . Plantar fasciitis, bilateral 02/23/2012  . Vitiligo 02/23/2012  . Difficulty hearing 02/13/2012  . History of lupus 12/21/2011  . Vitamin D deficiency 10/11/2010  . Hypothyroidism, unspecified 06/21/2010  . Lupus erythematosus 06/14/2010    Otelia Limes, PTA 08/31/2020, 1:26 PM  [place], [place], [address] Phone: [phone]   Fax:  [phone]  Name: Stephanie Chase MRN: [id number] Date of Birth: December 20, 1960

## 2020-08-31 NOTE — Patient Instructions (Signed)

## 2020-09-01 ENCOUNTER — Other Ambulatory Visit: Payer: Self-pay | Admitting: *Deleted

## 2020-09-01 ENCOUNTER — Encounter: Payer: Self-pay | Admitting: Hematology & Oncology

## 2020-09-01 MED ORDER — ONDANSETRON HCL 8 MG PO TABS
8.0000 mg | ORAL_TABLET | Freq: Three times a day (TID) | ORAL | 2 refills | Status: DC | PRN
Start: 2020-09-01 — End: 2021-05-30

## 2020-09-03 ENCOUNTER — Other Ambulatory Visit: Payer: Self-pay | Admitting: Family

## 2020-09-09 ENCOUNTER — Other Ambulatory Visit: Payer: Self-pay | Admitting: Hematology & Oncology

## 2020-09-10 ENCOUNTER — Telehealth: Payer: Self-pay | Admitting: *Deleted

## 2020-09-10 NOTE — Telephone Encounter (Signed)
Sent disability papers from [place] to [facility] HIM.

## 2020-09-13 ENCOUNTER — Ambulatory Visit: Payer: BC Managed Care – PPO | Admitting: Hematology & Oncology

## 2020-09-13 ENCOUNTER — Other Ambulatory Visit: Payer: BC Managed Care – PPO

## 2020-09-13 ENCOUNTER — Ambulatory Visit: Payer: BC Managed Care – PPO

## 2020-09-16 ENCOUNTER — Other Ambulatory Visit: Payer: BC Managed Care – PPO

## 2020-09-20 ENCOUNTER — Ambulatory Visit: Payer: BC Managed Care – PPO

## 2020-09-20 ENCOUNTER — Other Ambulatory Visit: Payer: BC Managed Care – PPO

## 2020-09-20 ENCOUNTER — Ambulatory Visit (HOSPITAL_COMMUNITY): Payer: BC Managed Care – PPO

## 2020-09-20 ENCOUNTER — Ambulatory Visit: Payer: BC Managed Care – PPO | Admitting: Hematology & Oncology

## 2020-09-21 ENCOUNTER — Telehealth: Payer: Self-pay | Admitting: *Deleted

## 2020-09-21 ENCOUNTER — Ambulatory Visit (HOSPITAL_COMMUNITY)
Admission: RE | Admit: 2020-09-21 | Discharge: 2020-09-21 | Disposition: A | Discharge status: Home / Self Care | Payer: BC Managed Care – PPO | Source: Ambulatory Visit | Attending: Hematology & Oncology | Admitting: Hematology & Oncology

## 2020-09-21 ENCOUNTER — Other Ambulatory Visit: Payer: Self-pay

## 2020-09-21 DIAGNOSIS — Z170 Estrogen receptor positive status [ER+]: Secondary | ICD-10-CM | POA: Diagnosis present

## 2020-09-21 DIAGNOSIS — C50411 Malignant neoplasm of upper-outer quadrant of right female breast: Secondary | ICD-10-CM | POA: Diagnosis not present

## 2020-09-21 LAB — GLUCOSE, CAPILLARY: Glucose-Capillary: 100 mg/dL — ABNORMAL HIGH (ref 70–99)

## 2020-09-21 IMAGING — PT NM PET TUM IMG INITIAL (PI) SKULL BASE T - THIGH
1 of 7 series · 1 of 25 positions shown · non-contrast
Comparison: Bone scan [DATE]

CLINICAL DATA: Subsequent treatment strategy for breast cancer.
Recent chemotherapy in early [DATE]. Mastectomy in [DATE].

EXAM:
NUCLEAR MEDICINE PET SKULL BASE TO THIGH
TECHNIQUE: 8.7 mCi F-18 FDG was injected intravenously. Full-ring PET imaging
was performed from the skull base to thigh after the radiotracer. CT
data was obtained and used for attenuation correction and anatomic
localization.
Fasting blood glucose: 100 mg/dl

[Series 4: ct sk_thigh 5.0 bf37 · axial · 5.0mm · 0.98mm/px · 1 of 231 slices shown]
[im 231/231  brain]
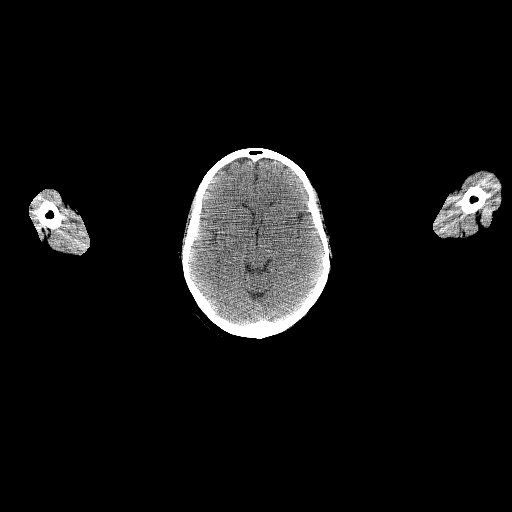

[1 of 25 positions shown; findings below may reference images not displayed]

FINDINGS: Mediastinal blood pool activity: SUV max

Liver activity: SUV max NA

NECK: No significant abnormal hypermetabolic activity in this
region.

Incidental CT findings: None

CHEST: [DATE] by 0.5 cm right upper lobe nodule on image 39 series 8,
stable from [DATE], no significant hypermetabolic activity.

1.0 by 0.8 cm right middle lobe nodule anteriorly on image 47 series
8, previously 1.1 by 0.7 cm on [DATE], maximum SUV 1.2.

0.4 cm sub solid subpleural right middle lobe nodule on image 52
series 8, previously 0.3 cm in diameter on [DATE].

Hazy density dependently in the left upper lobe on image 19 series 8
with maximum SUV 4.5. Low-grade hazy density dependently in both
lower lobes, fairly symmetric with maximum SUV of about 3.0.

Incidental CT findings: Mild thoracic aortic atherosclerotic
calcification. Severe centrilobular emphysema. Bilateral
mastectomies.

ABDOMEN/PELVIS: Perianal activity with maximum SUV 4.9, probably
incidental/physiologic.

Incidental CT findings: Aortoiliac atherosclerotic vascular disease.

SKELETON: No significant abnormal hypermetabolic activity in this
region. No accentuated metabolic activity in the right sacral ala.

Incidental CT findings: none
IMPRESSION: 1. Accentuated metabolic activity in hazy density dependently in
both lower lobes and dependently in the left upper lobe, probably
due to atelectasis and mild inflammation given the overall
morphology and appearance.
2. Small right lung nodules are essentially stable from [JC] and not
hypermetabolic.
3. Aortic Atherosclerosis ([JC]-[JC]) and Emphysema ([JC]-[JC]).

## 2020-09-21 MED ORDER — FLUDEOXYGLUCOSE F - 18 (FDG) INJECTION
8.6700 | Freq: Once | INTRAVENOUS | Status: AC
  Administered 2020-09-21: 8.67 via INTRAVENOUS

## 2020-09-21 NOTE — Telephone Encounter (Signed)
-----   Message from [name], MD sent at 09/21/2020  2:17 PM EDT ----- Please call him and tell her that the PET scan does not show any obvious active breast cancer.  I am not surprised by this.  [name]

## 2020-09-21 NOTE — Telephone Encounter (Signed)
As noted below by Dr. Marin Olp, I informed the patient that the PET scan does not show any obvious active breast cancer. She verbalized understanding.

## 2020-09-22 ENCOUNTER — Inpatient Hospital Stay: Payer: BC Managed Care – PPO | Attending: Hematology

## 2020-09-22 ENCOUNTER — Inpatient Hospital Stay (HOSPITAL_BASED_OUTPATIENT_CLINIC_OR_DEPARTMENT_OTHER): Payer: BC Managed Care – PPO | Admitting: Hematology & Oncology

## 2020-09-22 ENCOUNTER — Inpatient Hospital Stay: Payer: BC Managed Care – PPO

## 2020-09-22 ENCOUNTER — Telehealth: Payer: Self-pay

## 2020-09-22 ENCOUNTER — Encounter: Payer: Self-pay | Admitting: Hematology & Oncology

## 2020-09-22 VITALS — BP 128/79 | HR 86 | Temp 98.4°F | Resp 17 | Wt 173.1 lb

## 2020-09-22 DIAGNOSIS — C50411 Malignant neoplasm of upper-outer quadrant of right female breast: Secondary | ICD-10-CM

## 2020-09-22 DIAGNOSIS — Z5112 Encounter for antineoplastic immunotherapy: Secondary | ICD-10-CM | POA: Diagnosis present

## 2020-09-22 DIAGNOSIS — Z170 Estrogen receptor positive status [ER+]: Secondary | ICD-10-CM | POA: Diagnosis not present

## 2020-09-22 DIAGNOSIS — M818 Other osteoporosis without current pathological fracture: Secondary | ICD-10-CM | POA: Insufficient documentation

## 2020-09-22 LAB — CBC WITH DIFFERENTIAL (CANCER CENTER ONLY)
Abs Immature Granulocytes: 0.03 10*3/uL (ref 0.00–0.07)
Basophils Absolute: 0.1 10*3/uL (ref 0.0–0.1)
Basophils Relative: 1 %
Eosinophils Absolute: 0 10*3/uL (ref 0.0–0.5)
Eosinophils Relative: 1 %
HCT: 40.9 % (ref 36.0–46.0)
Hemoglobin: 14 g/dL (ref 12.0–15.0)
Immature Granulocytes: 0 %
Lymphocytes Relative: 23 %
Lymphs Abs: 2 10*3/uL (ref 0.7–4.0)
MCH: 29 pg (ref 26.0–34.0)
MCHC: 34.2 g/dL (ref 30.0–36.0)
MCV: 84.9 fL (ref 80.0–100.0)
Monocytes Absolute: 0.7 10*3/uL (ref 0.1–1.0)
Monocytes Relative: 8 %
Neutro Abs: 5.8 10*3/uL (ref 1.7–7.7)
Neutrophils Relative %: 67 %
Platelet Count: 278 10*3/uL (ref 150–400)
RBC: 4.82 MIL/uL (ref 3.87–5.11)
RDW: 13.1 % (ref 11.5–15.5)
WBC Count: 8.5 10*3/uL (ref 4.0–10.5)
nRBC: 0 % (ref 0.0–0.2)

## 2020-09-22 LAB — CMP (CANCER CENTER ONLY)
ALT: 10 U/L (ref 0–44)
AST: 16 U/L (ref 15–41)
Albumin: 4.2 g/dL (ref 3.5–5.0)
Alkaline Phosphatase: 78 U/L (ref 38–126)
Anion gap: 7 (ref 5–15)
BUN: 14 mg/dL (ref 6–20)
CO2: 26 mmol/L (ref 22–32)
Calcium: 10.5 mg/dL — ABNORMAL HIGH (ref 8.9–10.3)
Chloride: 104 mmol/L (ref 98–111)
Creatinine: 0.67 mg/dL (ref 0.44–1.00)
GFR, Estimated: >60 mL/min (ref >60)
Glucose, Bld: 88 mg/dL (ref 70–99)
Potassium: 4.1 mmol/L (ref 3.5–5.1)
Sodium: 137 mmol/L (ref 135–145)
Total Bilirubin: 0.3 mg/dL (ref 0.3–1.2)
Total Protein: 7 g/dL (ref 6.5–8.1)

## 2020-09-22 LAB — LACTATE DEHYDROGENASE: LDH: 174 U/L (ref 98–192)

## 2020-09-22 MED ORDER — SODIUM CHLORIDE 0.9 % IV SOLN
Freq: Once | INTRAVENOUS | Status: AC
  Filled 2020-09-22: qty 250

## 2020-09-22 MED ORDER — SODIUM CHLORIDE 0.9% FLUSH
10.0000 mL | INTRAVENOUS | Status: DC | PRN
  Administered 2020-09-22: 10 mL
  Filled 2020-09-22: qty 10

## 2020-09-22 MED ORDER — ACETAMINOPHEN 325 MG PO TABS
ORAL_TABLET | ORAL | Status: AC
  Filled 2020-09-22: qty 2

## 2020-09-22 MED ORDER — ACETAMINOPHEN 325 MG PO TABS
650.0000 mg | ORAL_TABLET | Freq: Once | ORAL | Status: AC
Start: 2020-09-22 — End: 2020-09-22
  Administered 2020-09-22: 650 mg via ORAL

## 2020-09-22 MED ORDER — DIPHENHYDRAMINE HCL 25 MG PO CAPS
25.0000 mg | ORAL_CAPSULE | Freq: Once | ORAL | Status: AC
  Administered 2020-09-22: 25 mg via ORAL

## 2020-09-22 MED ORDER — DIPHENHYDRAMINE HCL 25 MG PO CAPS
ORAL_CAPSULE | ORAL | Status: AC
  Filled 2020-09-22: qty 1

## 2020-09-22 MED ORDER — HEPARIN SOD (PORK) LOCK FLUSH 100 UNIT/ML IV SOLN
500.0000 [IU] | Freq: Once | INTRAVENOUS | Status: AC | PRN
  Administered 2020-09-22: 500 [IU]
  Filled 2020-09-22: qty 5

## 2020-09-22 MED ORDER — TRASTUZUMAB-QYYP CHEMO 150 MG IV SOLR
450.0000 mg | Freq: Once | INTRAVENOUS | Status: AC
  Administered 2020-09-22: 450 mg via INTRAVENOUS
  Filled 2020-09-22: qty 21.43

## 2020-09-22 NOTE — Patient Instructions (Signed)

## 2020-09-22 NOTE — Progress Notes (Signed)
Hematology and Oncology Follow Up Visit  Capri Veals [id number] 02/25/1961 60 y.o. 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 6 months- next dose in 01/2021     Interim History:  Ms. Gauer is back for follow-up.  The big news is that she is going to be going to [facility] on Friday.  She will be meeting her daughter and grandson after.  I know she will have a wonderful time out there.  We did do a PET scan on her.  This was done yesterday.  The PET scan did not show anything that was obvious with respect to recurrent breast cancer.  She had some lung infiltrates that do not appear to be malignant.  Her bones all looked fine.  Where she had had a past positive bone scan and the ribs did not show any uptake.  She is still feeling fatigued.  She does not have a lot of stamina.  She sleeps 4 hours a day.  She has had no problems with bleeding.  There is been some constipation issues.  She says that when she does take the Herceptin, she does have some diarrhea.  She has had no rashes.  There is been no cough.  She does get short of breath with exertion.  Overall, her performance status is probably ECOG 1.    Medications:  Current Outpatient Medications:    acetaminophen (TYLENOL) 500 MG tablet, Take 500-1,000 mg by mouth every 6 (six) hours as needed for moderate pain. , Disp: , Rfl:    albuterol (VENTOLIN HFA) 108 (90 Base) MCG/ACT inhaler, Inhale 2 puffs into the lungs every 6 (six) hours as needed for wheezing or shortness of breath., Disp: 8 g, Rfl: 2   ALPRAZolam (XANAX) 0.25 MG tablet, Take 1 tablet (0.25 mg total) by mouth daily as needed for anxiety., Disp: 30 tablet, Rfl: 0    baclofen (LIORESAL) 10 MG tablet, TAKE 1 TABLET BY MOUTH TWICE A DAY AS NEEDED FOR MUSCLE SPASM, Disp: 30 tablet, Rfl: 3   Dexlansoprazole (DEXILANT) 30 MG capsule, Take 1 capsule (30 mg total) by mouth daily. (Patient taking differently: Take 30 mg by mouth daily as needed.), Disp: 30 capsule, Rfl: 1   fluticasone (FLONASE) 50 MCG/ACT nasal spray, Place 2 sprays into both nostrils in the morning and at bedtime., Disp: 16 g, Rfl: 5   levothyroxine (SYNTHROID) 75 MCG tablet, Take 75 mcg by mouth daily before breakfast., Disp: , Rfl:    lidocaine-prilocaine (EMLA) cream, Apply 1 application topically as needed., Disp: 30 g, Rfl: 1   liothyronine (CYTOMEL) 5 MCG tablet, TAKE 2 TABLETS (=10MCG     TOTAL)     DAILY, Disp: 180 tablet, Rfl: 1   ondansetron (ZOFRAN) 8 MG tablet, Take 1 tablet (8 mg total) by mouth every 8 (eight) hours as needed., Disp: 30 tablet, Rfl: 2   pantoprazole (PROTONIX) 40 MG tablet, Take 40 mg by mouth daily as needed., Disp: , Rfl:    sodium fluoride (FLUORISHIELD) 1.1 % GEL dental gel, Sodium Fluoride 5000 Dry Mouth 1.1 % dental paste  USE AS DIRECTED, Disp: , Rfl:    DULoxetine (CYMBALTA) 20 MG capsule, TAKE 1 CAPSULE BY MOUTH EVERY DAY (Patient not taking: Reported on  09/22/2020), Disp: 90 capsule, Rfl: 1   fluticasone (FLOVENT HFA) 110 MCG/ACT inhaler, Inhale 2 puffs into the lungs in the morning and at bedtime., Disp: 1 each, Rfl: 12   LINZESS 72 MCG capsule, TAKE 1 CAPSULE BY MOUTH DAILY BEFORE BREAKFAST. (Patient not taking: No sig reported), Disp: 30 capsule, Rfl: 3 No current facility-administered medications for this visit.  Facility-Administered Medications Ordered in Other Visits:    alum & mag hydroxide-simeth (MAALOX/MYLANTA) 200-200-20 MG/5ML suspension 30 mL, 30 mL, Oral, Once, Tanner, Lyndon Code., PA-C  Allergies:  Allergies  Allergen Reactions   Bactrim [Sulfamethoxazole-Trimethoprim] Itching and Rash    Past Medical History, Surgical history, Social history,  and Family History were reviewed and updated.  Review of Systems: Review of Systems  Constitutional:  Positive for fatigue.  HENT:   Positive for hearing loss and tinnitus.   Eyes:  Positive for eye problems.  Respiratory:  Positive for cough.   Cardiovascular: Negative.   Gastrointestinal:  Positive for abdominal pain and nausea.  Endocrine: Negative.   Genitourinary: Negative.    Musculoskeletal:  Positive for arthralgias and myalgias.  Skin: Negative.   Neurological:  Positive for light-headedness.  Hematological: Negative.   Psychiatric/Behavioral: Negative.     Physical Exam:  weight is 173 lb 1.9 oz (78.5 kg). Her oral temperature is 98.4 F (36.9 C). Her blood pressure is 128/79 and her pulse is 86. Her respiration is 17 and oxygen saturation is 97%.   Wt Readings from Last 3 Encounters:  09/22/20 173 lb 1.9 oz (78.5 kg)  08/26/20 173 lb 6.4 oz (78.7 kg)  08/24/20 175 lb (79.4 kg)    Physical Exam Vitals reviewed.  Constitutional:      Comments: She has had bilateral mastectomies.  Mastectomy scars are well-healed.  There might be some nodularity along the mastectomy site.  Again I have to believe this is scar tissue.  There is no erythema or warmth or swelling associated with these.  I cannot palpate any bilateral axillary lymph nodes.  HENT:     Head: Normocephalic and atraumatic.  Eyes:     Pupils: Pupils are equal, round, and reactive to light.  Cardiovascular:     Rate and Rhythm: Normal rate and regular rhythm.     Heart sounds: Normal heart sounds.  Pulmonary:     Effort: Pulmonary effort is normal.     Breath sounds: Normal breath sounds.  Abdominal:     General: Bowel sounds are normal.     Palpations: Abdomen is soft.  Musculoskeletal:        General: No tenderness or deformity. Normal range of motion.     Cervical back: Normal range of motion.  Lymphadenopathy:     Cervical: No cervical adenopathy.  Skin:    General: Skin is warm and dry.      Findings: No erythema or rash.  Neurological:     Mental Status: She is alert and oriented to person, place, and time.  Psychiatric:        Behavior: Behavior normal.        Thought Content: Thought content normal.        Judgment: Judgment normal.   Lab Results  Component Value Date   WBC 8.5 09/22/2020   HGB 14.0 09/22/2020   HCT 40.9 09/22/2020   MCV 84.9 09/22/2020   PLT 278 09/22/2020     Chemistry      Component Value Date/Time   NA 137 09/22/2020 1155   K 4.1  09/22/2020 1155   CL 104 09/22/2020 1155   CO2 26 09/22/2020 1155   BUN 14 09/22/2020 1155   CREATININE 0.67 09/22/2020 1155   CREATININE 0.66 09/12/2017 0747      Component Value Date/Time   CALCIUM 10.5 (H) 09/22/2020 1155   ALKPHOS 78 09/22/2020 1155   AST 16 09/22/2020 1155   ALT 10 09/22/2020 1155   BILITOT 0.3 09/22/2020 1155      Impression and Plan: Ms. Cauthon is is a very nice 60 year old postmenopausal female.  She is originally from [place].  She moved down to [facility].  She has 2 grandchildren already.  Her children live on the [place].  She has early stage breast cancer.  Unfortunately, it is HER-2 positive.  Because this, she required chemotherapy with Herceptin.  Because of the chemotherapy, and she has had side effects.  This really has affected her quality of life.    I am whether the PET scan looks fine.  However, I do think we have to follow-up with a another PET scan probably in the fall just to make sure we are doing okay with no respect to recurrence.  I do not see a problem with her going to [facility].  She is can be very diligent and make sure that she wears a mask.  I told her make sure she wears a mask on the flight.  I know the flight will be crowded.  She will stay hydrated while she is out in [place].  We will plan to get her back in another 3-4 weeks.  I still do not think she is ready for an aromatase inhibitor.     [name], MD 6/29/20221:25  PM

## 2020-09-22 NOTE — Patient Instructions (Signed)
[name]  Discharge Instructions: Thank you for choosing [facility] to provide your oncology and hematology care.   If you have a lab appointment with the [facility], please go directly to the [facility] and check in at the registration area.  Wear comfortable clothing and clothing appropriate for easy access to any Portacath or PICC line.   We strive to give you quality time with your provider. You may need to reschedule your appointment if you arrive late (15 or more minutes).  Arriving late affects you and other patients whose appointments are after yours.  Also, if you miss three or more appointments without notifying the office, you may be dismissed from the clinic at the provider's discretion.      For prescription refill requests, have your pharmacy contact our office and allow 72 hours for refills to be completed.  Today you received the following chemotherapy and/or immunotherapy agents trastusamab    To help prevent nausea and vomiting after your treatment, we encourage you to take your nausea medication as directed.  BELOW ARE SYMPTOMS THAT SHOULD BE REPORTED IMMEDIATELY: *FEVER GREATER THAN 100.4 F (38 C) OR HIGHER *CHILLS OR SWEATING *NAUSEA AND VOMITING THAT IS NOT CONTROLLED WITH YOUR NAUSEA MEDICATION *UNUSUAL SHORTNESS OF BREATH *UNUSUAL BRUISING OR BLEEDING *URINARY PROBLEMS (pain or burning when urinating, or frequent urination) *BOWEL PROBLEMS (unusual diarrhea, constipation, pain near the anus) TENDERNESS IN MOUTH AND THROAT WITH OR WITHOUT PRESENCE OF ULCERS (sore throat, sores in mouth, or a toothache) UNUSUAL RASH, SWELLING OR PAIN  UNUSUAL VAGINAL DISCHARGE OR ITCHING   Items with * indicate a potential emergency and should be followed up as soon as possible or go to the Emergency Department if any problems should occur.  Please show the CHEMOTHERAPY ALERT CARD or IMMUNOTHERAPY ALERT CARD at check-in to the  Emergency Department and triage nurse. Should you have questions after your visit or need to cancel or reschedule your appointment, please contact [facility]  [phone] and follow the prompts.  Office hours are 8:00 a.m. to 4:30 p.m. Monday - Friday. Please note that voicemails left after 4:00 p.m. may not be returned until the following business day.  We are closed weekends and major holidays. You have access to a nurse at all times for urgent questions. Please call the main number to the clinic [phone] and follow the prompts.  For any non-urgent questions, you may also contact your provider using MyChart. We now offer e-Visits for anyone 60 and older to request care online for non-urgent symptoms. For details visit [email or web address].   Also download the MyChart app! Go to the app store, search "MyChart", open the app, select [facility], and log in with your MyChart username and password.  Due to Covid, a mask is required upon entering the hospital/clinic. If you do not have a mask, one will be given to you upon arrival. For doctor visits, patients may have 1 support person aged 23 or older with them. For treatment visits, patients cannot have anyone with them due to current Covid guidelines and our immunocompromised population.

## 2020-09-22 NOTE — Telephone Encounter (Signed)
Appts made per 09/22/20 los and pt will gain a sch in chemo/tx and through [employer]

## 2020-09-23 LAB — CANCER ANTIGEN 27.29: CA 27.29: 17.8 U/mL (ref 0.0–38.6)

## 2020-10-01 ENCOUNTER — Telehealth: Payer: Self-pay | Admitting: *Deleted

## 2020-10-01 NOTE — Telephone Encounter (Signed)
Per scheduling message - called and lvm that it was ok with Dr. Marin Olp to wait to have treatment on 10/26/20 so she can visit her ailing mother in [place]- requested callback to proceed reschedule

## 2020-10-07 ENCOUNTER — Telehealth: Payer: Self-pay

## 2020-10-11 ENCOUNTER — Other Ambulatory Visit: Payer: Self-pay

## 2020-10-11 ENCOUNTER — Ambulatory Visit: Admission: RE | Admit: 2020-10-11 | Payer: Self-pay | Source: Ambulatory Visit

## 2020-10-18 ENCOUNTER — Ambulatory Visit: Payer: BC Managed Care – PPO | Admitting: Hematology & Oncology

## 2020-10-18 ENCOUNTER — Ambulatory Visit: Payer: BC Managed Care – PPO

## 2020-10-18 ENCOUNTER — Other Ambulatory Visit: Payer: BC Managed Care – PPO

## 2020-10-25 ENCOUNTER — Inpatient Hospital Stay: Payer: BC Managed Care – PPO

## 2020-10-25 ENCOUNTER — Telehealth: Payer: Self-pay

## 2020-10-25 ENCOUNTER — Inpatient Hospital Stay: Payer: BC Managed Care – PPO | Attending: Hematology

## 2020-10-25 ENCOUNTER — Other Ambulatory Visit: Payer: Self-pay

## 2020-10-25 ENCOUNTER — Encounter: Payer: Self-pay | Admitting: Hematology & Oncology

## 2020-10-25 ENCOUNTER — Inpatient Hospital Stay (HOSPITAL_BASED_OUTPATIENT_CLINIC_OR_DEPARTMENT_OTHER): Payer: BC Managed Care – PPO | Admitting: Hematology & Oncology

## 2020-10-25 VITALS — BP 113/85 | HR 73 | Temp 98.3°F | Resp 18 | Wt 172.0 lb

## 2020-10-25 DIAGNOSIS — C50411 Malignant neoplasm of upper-outer quadrant of right female breast: Secondary | ICD-10-CM | POA: Diagnosis present

## 2020-10-25 DIAGNOSIS — M810 Age-related osteoporosis without current pathological fracture: Secondary | ICD-10-CM | POA: Diagnosis not present

## 2020-10-25 DIAGNOSIS — L930 Discoid lupus erythematosus: Secondary | ICD-10-CM

## 2020-10-25 DIAGNOSIS — Z5112 Encounter for antineoplastic immunotherapy: Secondary | ICD-10-CM | POA: Diagnosis not present

## 2020-10-25 DIAGNOSIS — E038 Other specified hypothyroidism: Secondary | ICD-10-CM | POA: Diagnosis not present

## 2020-10-25 DIAGNOSIS — Z170 Estrogen receptor positive status [ER+]: Secondary | ICD-10-CM

## 2020-10-25 DIAGNOSIS — Z853 Personal history of malignant neoplasm of breast: Secondary | ICD-10-CM

## 2020-10-25 DIAGNOSIS — M818 Other osteoporosis without current pathological fracture: Secondary | ICD-10-CM

## 2020-10-25 LAB — CMP (CANCER CENTER ONLY)
ALT: 21 U/L (ref 0–44)
AST: 22 U/L (ref 15–41)
Albumin: 4.1 g/dL (ref 3.5–5.0)
Alkaline Phosphatase: 71 U/L (ref 38–126)
Anion gap: 7 (ref 5–15)
BUN: 10 mg/dL (ref 6–20)
CO2: 26 mmol/L (ref 22–32)
Calcium: 10.3 mg/dL (ref 8.9–10.3)
Chloride: 104 mmol/L (ref 98–111)
Creatinine: 0.64 mg/dL (ref 0.44–1.00)
GFR, Estimated: >60 mL/min (ref >60)
Glucose, Bld: 85 mg/dL (ref 70–99)
Potassium: 4 mmol/L (ref 3.5–5.1)
Sodium: 137 mmol/L (ref 135–145)
Total Bilirubin: 0.4 mg/dL (ref 0.3–1.2)
Total Protein: 7.1 g/dL (ref 6.5–8.1)

## 2020-10-25 LAB — CBC WITH DIFFERENTIAL (CANCER CENTER ONLY)
Abs Immature Granulocytes: 0.03 10*3/uL (ref 0.00–0.07)
Basophils Absolute: 0.1 10*3/uL (ref 0.0–0.1)
Basophils Relative: 1 %
Eosinophils Absolute: 0 10*3/uL (ref 0.0–0.5)
Eosinophils Relative: 0 %
HCT: 42.5 % (ref 36.0–46.0)
Hemoglobin: 14.2 g/dL (ref 12.0–15.0)
Immature Granulocytes: 0 %
Lymphocytes Relative: 26 %
Lymphs Abs: 2.2 10*3/uL (ref 0.7–4.0)
MCH: 28.5 pg (ref 26.0–34.0)
MCHC: 33.4 g/dL (ref 30.0–36.0)
MCV: 85.3 fL (ref 80.0–100.0)
Monocytes Absolute: 0.5 10*3/uL (ref 0.1–1.0)
Monocytes Relative: 6 %
Neutro Abs: 5.6 10*3/uL (ref 1.7–7.7)
Neutrophils Relative %: 67 %
Platelet Count: 285 10*3/uL (ref 150–400)
RBC: 4.98 MIL/uL (ref 3.87–5.11)
RDW: 13.1 % (ref 11.5–15.5)
WBC Count: 8.5 10*3/uL (ref 4.0–10.5)
nRBC: 0 % (ref 0.0–0.2)

## 2020-10-25 LAB — LACTATE DEHYDROGENASE: LDH: 203 U/L — ABNORMAL HIGH (ref 98–192)

## 2020-10-25 MED ORDER — HEPARIN SOD (PORK) LOCK FLUSH 100 UNIT/ML IV SOLN
500.0000 [IU] | Freq: Once | INTRAVENOUS | Status: AC | PRN
  Administered 2020-10-25: 500 [IU]
  Filled 2020-10-25: qty 5

## 2020-10-25 MED ORDER — SODIUM CHLORIDE 0.9 % IV SOLN
Freq: Once | INTRAVENOUS | Status: AC
  Filled 2020-10-25: qty 250

## 2020-10-25 MED ORDER — SODIUM CHLORIDE 0.9 % IV SOLN
INTRAVENOUS | Status: DC
  Filled 2020-10-25: qty 250

## 2020-10-25 MED ORDER — DIPHENHYDRAMINE HCL 25 MG PO CAPS
25.0000 mg | ORAL_CAPSULE | Freq: Once | ORAL | Status: AC
  Administered 2020-10-25: 25 mg via ORAL

## 2020-10-25 MED ORDER — SODIUM CHLORIDE 0.9% FLUSH
10.0000 mL | INTRAVENOUS | Status: DC | PRN
  Administered 2020-10-25: 10 mL
  Filled 2020-10-25: qty 10

## 2020-10-25 MED ORDER — ACETAMINOPHEN 325 MG PO TABS
650.0000 mg | ORAL_TABLET | Freq: Once | ORAL | Status: AC
  Administered 2020-10-25: 650 mg via ORAL

## 2020-10-25 MED ORDER — DIPHENHYDRAMINE HCL 25 MG PO CAPS
ORAL_CAPSULE | ORAL | Status: AC
  Filled 2020-10-25: qty 1

## 2020-10-25 MED ORDER — ACETAMINOPHEN 325 MG PO TABS
ORAL_TABLET | ORAL | Status: AC
  Filled 2020-10-25: qty 2

## 2020-10-25 MED ORDER — TRASTUZUMAB-QYYP CHEMO 150 MG IV SOLR
450.0000 mg | Freq: Once | INTRAVENOUS | Status: AC
  Administered 2020-10-25: 450 mg via INTRAVENOUS
  Filled 2020-10-25: qty 21.43

## 2020-10-25 NOTE — Progress Notes (Signed)
Hematology and Oncology Follow Up Visit  Stephanie Chase [id number] Mar 13, 1961 60 y.o. 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 6 months- next dose in 01/2021     Interim History:  [name] is back for follow-up.  She did have a good time out in [place].  She saw her daughter and grandson.  Her son-in-law is a Surveyor, mining.  Hopefully, he will be transferred back to [facility].  Despite having a good time out there, she still struggling.  She still has no energy.  She is still quite fatigued.  She still has a lot of myalgias and arthralgias.  I am just not sure how else we can try to help all of this.  She has had no problems with her bowels or bladder.  She has had no bleeding.  Her appetite has been decent.  There is been no headache.  She has had no rashes.  There is been no leg swelling.Marland Kitchen  She would like to have her Port-A-Cath taken out.  We will have to get surgery to do this for [place].  She has had no fever.  Currently, I would say her performance status is ECOG 2.    Medications:  Current Outpatient Medications:    acetaminophen (TYLENOL) 500 MG tablet, Take 500-1,000 mg by mouth every 6 (six) hours as needed for moderate pain. , Disp: , Rfl:    albuterol (VENTOLIN HFA) 108 (90 Base) MCG/ACT inhaler, Inhale 2 puffs into the lungs every 6 (six) hours as needed for wheezing or shortness of breath., Disp: 8 g, Rfl: 2   ALPRAZolam (XANAX) 0.25 MG tablet, Take 1 tablet (0.25 mg total) by mouth daily as needed for anxiety., Disp: 30 tablet, Rfl: 0   baclofen (LIORESAL) 10 MG tablet, TAKE 1 TABLET BY MOUTH TWICE A DAY AS NEEDED FOR MUSCLE SPASM, Disp: 30  tablet, Rfl: 3   Dexlansoprazole (DEXILANT) 30 MG capsule, Take 1 capsule (30 mg total) by mouth daily. (Patient taking differently: Take 30 mg by mouth daily as needed.), Disp: 30 capsule, Rfl: 1   DULoxetine (CYMBALTA) 20 MG capsule, TAKE 1 CAPSULE BY MOUTH EVERY DAY (Patient not taking: Reported on 09/22/2020), Disp: 90 capsule, Rfl: 1   fluticasone (FLONASE) 50 MCG/ACT nasal spray, Place 2 sprays into both nostrils in the morning and at bedtime., Disp: 16 g, Rfl: 5   fluticasone (FLOVENT HFA) 110 MCG/ACT inhaler, Inhale 2 puffs into the lungs in the morning and at bedtime., Disp: 1 each, Rfl: 12   levothyroxine (SYNTHROID) 75 MCG tablet, Take 75 mcg by mouth daily before breakfast., Disp: , Rfl:    lidocaine-prilocaine (EMLA) cream, Apply 1 application topically as needed., Disp: 30 g, Rfl: 1   LINZESS 72 MCG capsule, TAKE 1 CAPSULE BY MOUTH DAILY BEFORE BREAKFAST. (Patient not taking: No sig reported), Disp: 30 capsule, Rfl: 3   liothyronine (CYTOMEL) 5 MCG tablet, TAKE 2 TABLETS (=10MCG     TOTAL)     DAILY, Disp: 180 tablet, Rfl: 1   ondansetron (ZOFRAN) 8 MG tablet, Take 1 tablet (8 mg total) by mouth every 8 (eight) hours as needed., Disp: 30 tablet, Rfl: 2   pantoprazole (PROTONIX) 40 MG tablet, Take 40 mg  by mouth daily as needed., Disp: , Rfl:    sodium fluoride (FLUORISHIELD) 1.1 % GEL dental gel, Sodium Fluoride 5000 Dry Mouth 1.1 % dental paste  USE AS DIRECTED, Disp: , Rfl:  No current facility-administered medications for this visit.  Facility-Administered Medications Ordered in Other Visits:    alum & mag hydroxide-simeth (MAALOX/MYLANTA) 200-200-20 MG/5ML suspension 30 mL, 30 mL, Oral, Once, Tanner, Lyndon Code., PA-C  Allergies:  Allergies  Allergen Reactions   Bactrim [Sulfamethoxazole-Trimethoprim] Itching and Rash    Past Medical History, Surgical history, Social history, and Family History were reviewed and updated.  Review of Systems: Review of Systems  Constitutional:   Positive for fatigue.  HENT:   Positive for hearing loss and tinnitus.   Eyes:  Positive for eye problems.  Respiratory:  Positive for cough.   Cardiovascular: Negative.   Gastrointestinal:  Positive for abdominal pain and nausea.  Endocrine: Negative.   Genitourinary: Negative.    Musculoskeletal:  Positive for arthralgias and myalgias.  Skin: Negative.   Neurological:  Positive for light-headedness.  Hematological: Negative.   Psychiatric/Behavioral: Negative.     Physical Exam:  weight is 172 lb (78 kg). Her oral temperature is 98.3 F (36.8 C). Her blood pressure is 113/85 and her pulse is 73. Her respiration is 18 and oxygen saturation is 95%.   Wt Readings from Last 3 Encounters:  10/25/20 172 lb (78 kg)  09/22/20 173 lb 1.9 oz (78.5 kg)  08/26/20 173 lb 6.4 oz (78.7 kg)    Physical Exam Vitals reviewed.  Constitutional:      Comments: She has had bilateral mastectomies.  Mastectomy scars are well-healed.  There might be some nodularity along the mastectomy site.  Again I have to believe this is scar tissue.  There is no erythema or warmth or swelling associated with these.  I cannot palpate any bilateral axillary lymph nodes.  HENT:     Head: Normocephalic and atraumatic.  Eyes:     Pupils: Pupils are equal, round, and reactive to light.  Cardiovascular:     Rate and Rhythm: Normal rate and regular rhythm.     Heart sounds: Normal heart sounds.  Pulmonary:     Effort: Pulmonary effort is normal.     Breath sounds: Normal breath sounds.  Abdominal:     General: Bowel sounds are normal.     Palpations: Abdomen is soft.  Musculoskeletal:        General: No tenderness or deformity. Normal range of motion.     Cervical back: Normal range of motion.  Lymphadenopathy:     Cervical: No cervical adenopathy.  Skin:    General: Skin is warm and dry.     Findings: No erythema or rash.  Neurological:     Mental Status: She is alert and oriented to person, place, and time.   Psychiatric:        Behavior: Behavior normal.        Thought Content: Thought content normal.        Judgment: Judgment normal.   Lab Results  Component Value Date   WBC 8.5 10/25/2020   HGB 14.2 10/25/2020   HCT 42.5 10/25/2020   MCV 85.3 10/25/2020   PLT 285 10/25/2020     Chemistry      Component Value Date/Time   NA 137 10/25/2020 1129   K 4.0 10/25/2020 1129   CL 104 10/25/2020 1129   CO2 26 10/25/2020 1129   BUN 10 10/25/2020 1129  CREATININE 0.64 10/25/2020 1129   CREATININE 0.66 09/12/2017 0747      Component Value Date/Time   CALCIUM 10.3 10/25/2020 1129   ALKPHOS 71 10/25/2020 1129   AST 22 10/25/2020 1129   ALT 21 10/25/2020 1129   BILITOT 0.4 10/25/2020 1129      Impression and Plan: Ms. Binford is is a very nice 60 year old postmenopausal female.  She is originally from [place].  She moved down to [facility].  She has 2 grandchildren already.  Her children live on the [place].  She has early stage breast cancer.  Unfortunately, it is HER-2 positive.  Because this, she required chemotherapy with Herceptin.  Because of the chemotherapy, and she has had side effects.  This really has affected her quality of life.    This will be her last dose of Herceptin.  I am glad about this.  She is very happy about this.  We still are going to have to get her on a antiestrogen.  Hopefully, she will tolerate one of the aromatase inhibitors.  If not, we will have to utilize tamoxifen.  I do not see that we have to do any scans on her.  She had a scan I think back in June or July.  I just wish that she would get better from the side effects of treatment.  I am just not sure how this we could try to help her out.  I will plan to get her back in about 6 weeks now.  At that time, we will talk about an aromatase inhibitor.   [name], MD 8/1/202212:43 PM

## 2020-10-25 NOTE — Telephone Encounter (Signed)
Appts made per 10/25/20 los and req to view on [employer]

## 2020-10-26 LAB — CANCER ANTIGEN 27.29: CA 27.29: 19.3 U/mL (ref 0.0–38.6)

## 2020-10-29 ENCOUNTER — Other Ambulatory Visit: Payer: Self-pay | Admitting: Obstetrics and Gynecology

## 2020-11-09 ENCOUNTER — Other Ambulatory Visit: Payer: Self-pay

## 2020-11-09 ENCOUNTER — Other Ambulatory Visit: Payer: Self-pay | Admitting: Hematology & Oncology

## 2020-11-09 MED ORDER — ALPRAZOLAM 0.25 MG PO TABS: 0.2500 mg | ORAL_TABLET | Freq: Every day | ORAL | 0 refills | Status: DC | PRN

## 2020-11-09 NOTE — Progress Notes (Unsigned)
Pt called for refill on xanax

## 2020-12-09 ENCOUNTER — Telehealth (HOSPITAL_COMMUNITY): Payer: Self-pay | Admitting: Vascular Surgery

## 2020-12-09 NOTE — Telephone Encounter (Signed)
Spoke w/pt advised Calcium needs to be resch she is agreeable, message sent to [facility] scheduling,

## 2020-12-09 NOTE — Telephone Encounter (Signed)
Pt called stating she did not do her Calcium score trest due to the co pay  system being down, she has appt w/ DB 9/22, she wants to know if she needs to get that test before her echo and appt w/ db.. please advise

## 2020-12-10 ENCOUNTER — Telehealth: Payer: Self-pay | Admitting: *Deleted

## 2020-12-10 ENCOUNTER — Other Ambulatory Visit: Payer: Self-pay | Admitting: Hematology & Oncology

## 2020-12-10 ENCOUNTER — Telehealth: Payer: Self-pay

## 2020-12-10 ENCOUNTER — Encounter: Payer: Self-pay | Admitting: Hematology & Oncology

## 2020-12-10 ENCOUNTER — Other Ambulatory Visit: Payer: Self-pay

## 2020-12-10 ENCOUNTER — Ambulatory Visit (HOSPITAL_BASED_OUTPATIENT_CLINIC_OR_DEPARTMENT_OTHER)
Admission: RE | Admit: 2020-12-10 | Discharge: 2020-12-10 | Disposition: A | Discharge status: Home / Self Care | Payer: BC Managed Care – PPO | Source: Ambulatory Visit | Attending: Hematology & Oncology | Admitting: Hematology & Oncology

## 2020-12-10 ENCOUNTER — Inpatient Hospital Stay: Payer: BC Managed Care – PPO

## 2020-12-10 ENCOUNTER — Inpatient Hospital Stay (HOSPITAL_BASED_OUTPATIENT_CLINIC_OR_DEPARTMENT_OTHER): Payer: BC Managed Care – PPO | Admitting: Hematology & Oncology

## 2020-12-10 ENCOUNTER — Inpatient Hospital Stay: Payer: BC Managed Care – PPO | Attending: Hematology

## 2020-12-10 VITALS — BP 119/79 | HR 88 | Temp 98.4°F | Resp 20 | Wt 172.4 lb

## 2020-12-10 DIAGNOSIS — R42 Dizziness and giddiness: Secondary | ICD-10-CM | POA: Insufficient documentation

## 2020-12-10 DIAGNOSIS — M25569 Pain in unspecified knee: Secondary | ICD-10-CM | POA: Diagnosis not present

## 2020-12-10 DIAGNOSIS — Z853 Personal history of malignant neoplasm of breast: Secondary | ICD-10-CM

## 2020-12-10 DIAGNOSIS — M818 Other osteoporosis without current pathological fracture: Secondary | ICD-10-CM | POA: Diagnosis not present

## 2020-12-10 DIAGNOSIS — H9190 Unspecified hearing loss, unspecified ear: Secondary | ICD-10-CM | POA: Insufficient documentation

## 2020-12-10 DIAGNOSIS — R110 Nausea: Secondary | ICD-10-CM | POA: Diagnosis not present

## 2020-12-10 DIAGNOSIS — Z95828 Presence of other vascular implants and grafts: Secondary | ICD-10-CM

## 2020-12-10 DIAGNOSIS — C50411 Malignant neoplasm of upper-outer quadrant of right female breast: Secondary | ICD-10-CM

## 2020-12-10 DIAGNOSIS — E559 Vitamin D deficiency, unspecified: Secondary | ICD-10-CM

## 2020-12-10 DIAGNOSIS — H9319 Tinnitus, unspecified ear: Secondary | ICD-10-CM | POA: Diagnosis not present

## 2020-12-10 DIAGNOSIS — M2550 Pain in unspecified joint: Secondary | ICD-10-CM | POA: Diagnosis not present

## 2020-12-10 DIAGNOSIS — Z170 Estrogen receptor positive status [ER+]: Secondary | ICD-10-CM

## 2020-12-10 DIAGNOSIS — M791 Myalgia, unspecified site: Secondary | ICD-10-CM | POA: Insufficient documentation

## 2020-12-10 DIAGNOSIS — M79601 Pain in right arm: Secondary | ICD-10-CM | POA: Diagnosis not present

## 2020-12-10 DIAGNOSIS — R5383 Other fatigue: Secondary | ICD-10-CM | POA: Diagnosis not present

## 2020-12-10 DIAGNOSIS — R109 Unspecified abdominal pain: Secondary | ICD-10-CM | POA: Insufficient documentation

## 2020-12-10 DIAGNOSIS — Z881 Allergy status to other antibiotic agents status: Secondary | ICD-10-CM | POA: Diagnosis not present

## 2020-12-10 DIAGNOSIS — Z79899 Other long term (current) drug therapy: Secondary | ICD-10-CM | POA: Insufficient documentation

## 2020-12-10 DIAGNOSIS — E038 Other specified hypothyroidism: Secondary | ICD-10-CM

## 2020-12-10 DIAGNOSIS — L930 Discoid lupus erythematosus: Secondary | ICD-10-CM

## 2020-12-10 DIAGNOSIS — T451X5A Adverse effect of antineoplastic and immunosuppressive drugs, initial encounter: Secondary | ICD-10-CM | POA: Insufficient documentation

## 2020-12-10 DIAGNOSIS — R059 Cough, unspecified: Secondary | ICD-10-CM | POA: Insufficient documentation

## 2020-12-10 LAB — CBC WITH DIFFERENTIAL (CANCER CENTER ONLY)
Abs Immature Granulocytes: 0.03 10*3/uL (ref 0.00–0.07)
Basophils Absolute: 0.1 10*3/uL (ref 0.0–0.1)
Basophils Relative: 1 %
Eosinophils Absolute: 0.1 10*3/uL (ref 0.0–0.5)
Eosinophils Relative: 1 %
HCT: 43.5 % (ref 36.0–46.0)
Hemoglobin: 14.4 g/dL (ref 12.0–15.0)
Immature Granulocytes: 0 %
Lymphocytes Relative: 23 %
Lymphs Abs: 2 10*3/uL (ref 0.7–4.0)
MCH: 28.3 pg (ref 26.0–34.0)
MCHC: 33.1 g/dL (ref 30.0–36.0)
MCV: 85.5 fL (ref 80.0–100.0)
Monocytes Absolute: 0.7 10*3/uL (ref 0.1–1.0)
Monocytes Relative: 8 %
Neutro Abs: 5.9 10*3/uL (ref 1.7–7.7)
Neutrophils Relative %: 67 %
Platelet Count: 308 10*3/uL (ref 150–400)
RBC: 5.09 MIL/uL (ref 3.87–5.11)
RDW: 13.5 % (ref 11.5–15.5)
WBC Count: 8.8 10*3/uL (ref 4.0–10.5)
nRBC: 0 % (ref 0.0–0.2)

## 2020-12-10 LAB — CMP (CANCER CENTER ONLY)
ALT: 15 U/L (ref 0–44)
AST: 19 U/L (ref 15–41)
Albumin: 4.2 g/dL (ref 3.5–5.0)
Alkaline Phosphatase: 64 U/L (ref 38–126)
Anion gap: 10 (ref 5–15)
BUN: 10 mg/dL (ref 6–20)
CO2: 24 mmol/L (ref 22–32)
Calcium: 10.2 mg/dL (ref 8.9–10.3)
Chloride: 102 mmol/L (ref 98–111)
Creatinine: 0.71 mg/dL (ref 0.44–1.00)
GFR, Estimated: >60 mL/min (ref >60)
Glucose, Bld: 112 mg/dL — ABNORMAL HIGH (ref 70–99)
Potassium: 3.8 mmol/L (ref 3.5–5.1)
Sodium: 136 mmol/L (ref 135–145)
Total Bilirubin: 0.4 mg/dL (ref 0.3–1.2)
Total Protein: 7.2 g/dL (ref 6.5–8.1)

## 2020-12-10 LAB — TSH: TSH: 2.14 u[IU]/mL (ref 0.308–3.960)

## 2020-12-10 LAB — VITAMIN D 25 HYDROXY (VIT D DEFICIENCY, FRACTURES): Vit D, 25-Hydroxy: 23.52 ng/mL — ABNORMAL LOW (ref 30–100)

## 2020-12-10 LAB — IRON AND TIBC
Iron: 113 ug/dL (ref 41–142)
Saturation Ratios: 37 % (ref 21–57)
TIBC: 308 ug/dL (ref 236–444)
UIBC: 195 ug/dL (ref 120–384)

## 2020-12-10 LAB — VITAMIN B12: Vitamin B-12: 320 pg/mL (ref 180–914)

## 2020-12-10 LAB — MAGNESIUM: Magnesium: 2 mg/dL (ref 1.7–2.4)

## 2020-12-10 LAB — FERRITIN: Ferritin: 78 ng/mL (ref 11–307)

## 2020-12-10 LAB — LACTATE DEHYDROGENASE: LDH: 167 U/L (ref 98–192)

## 2020-12-10 IMAGING — DX DG KNEE COMPLETE 4+V*R*
4 series · 4 of 4 positions shown · non-contrast
Comparison: None.

CLINICAL DATA: Bilateral knee pain.

EXAM:
RIGHT KNEE - COMPLETE 4+ VIEW

[knee ap]
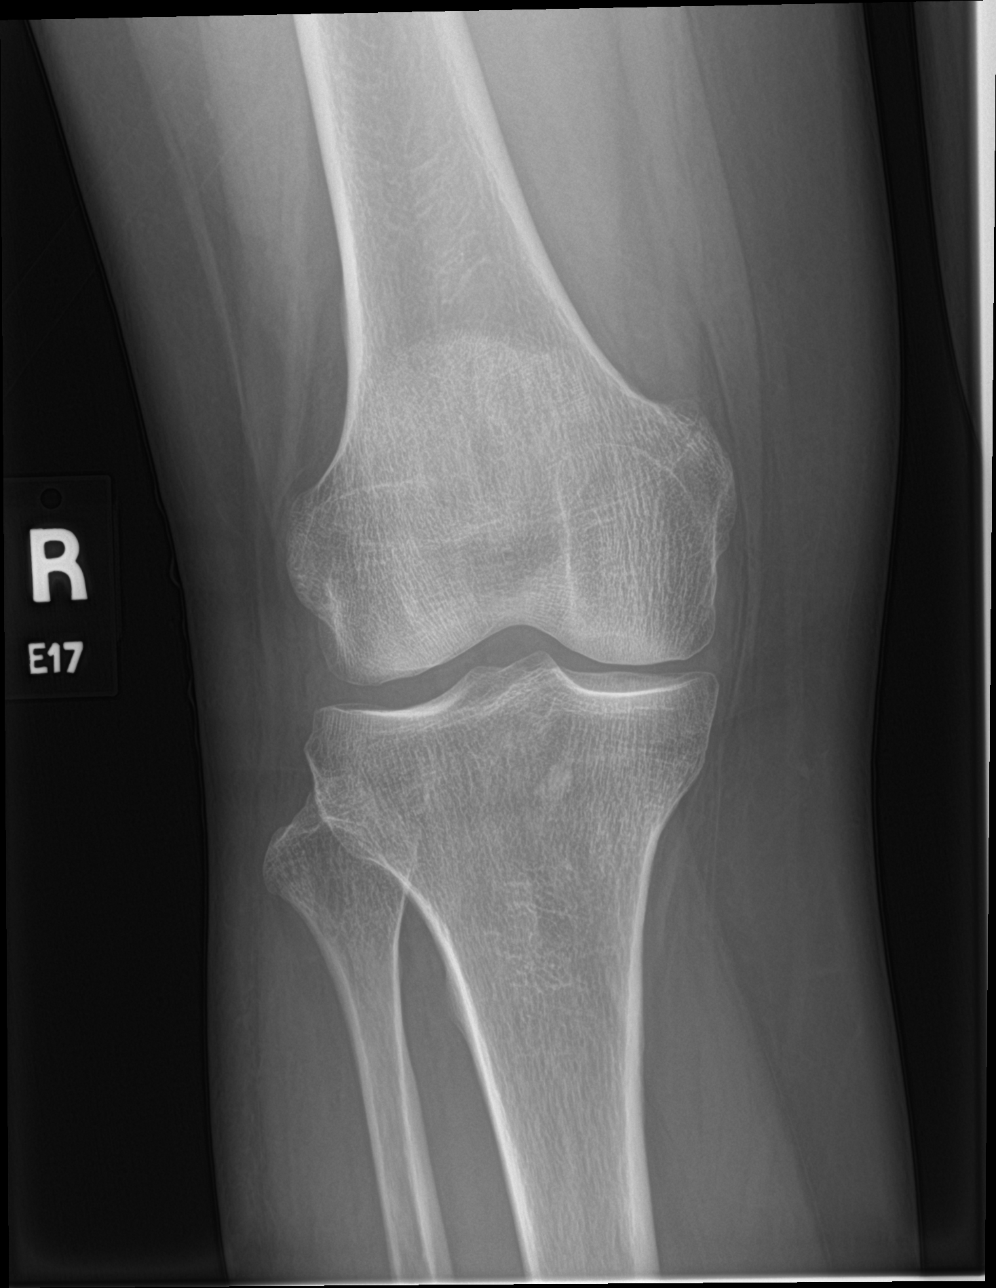

[knee lat]
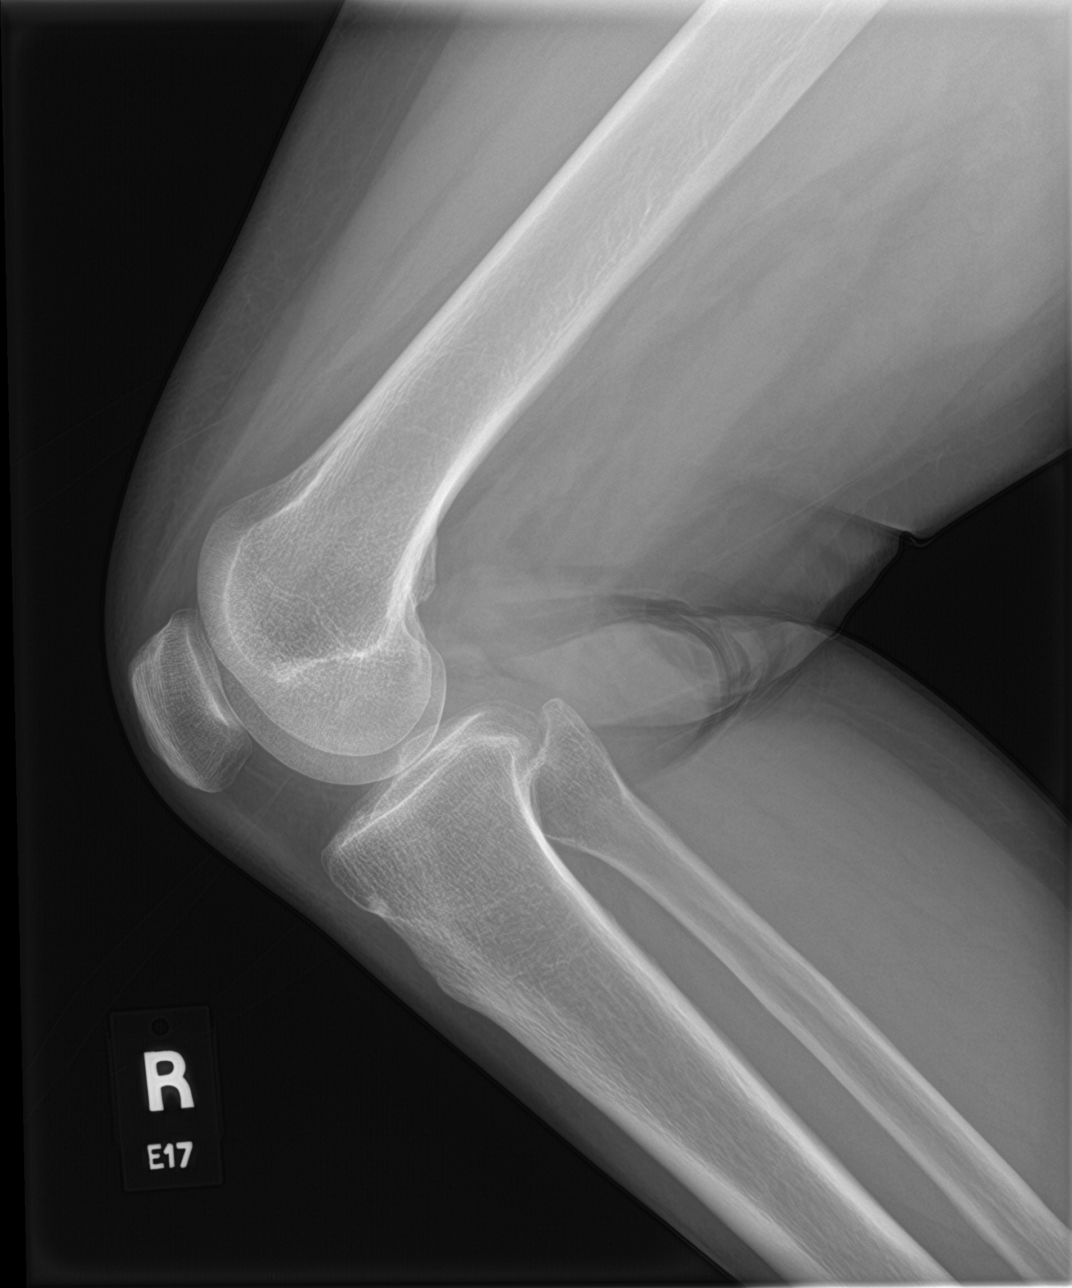

[knee obl (1 of 2)]
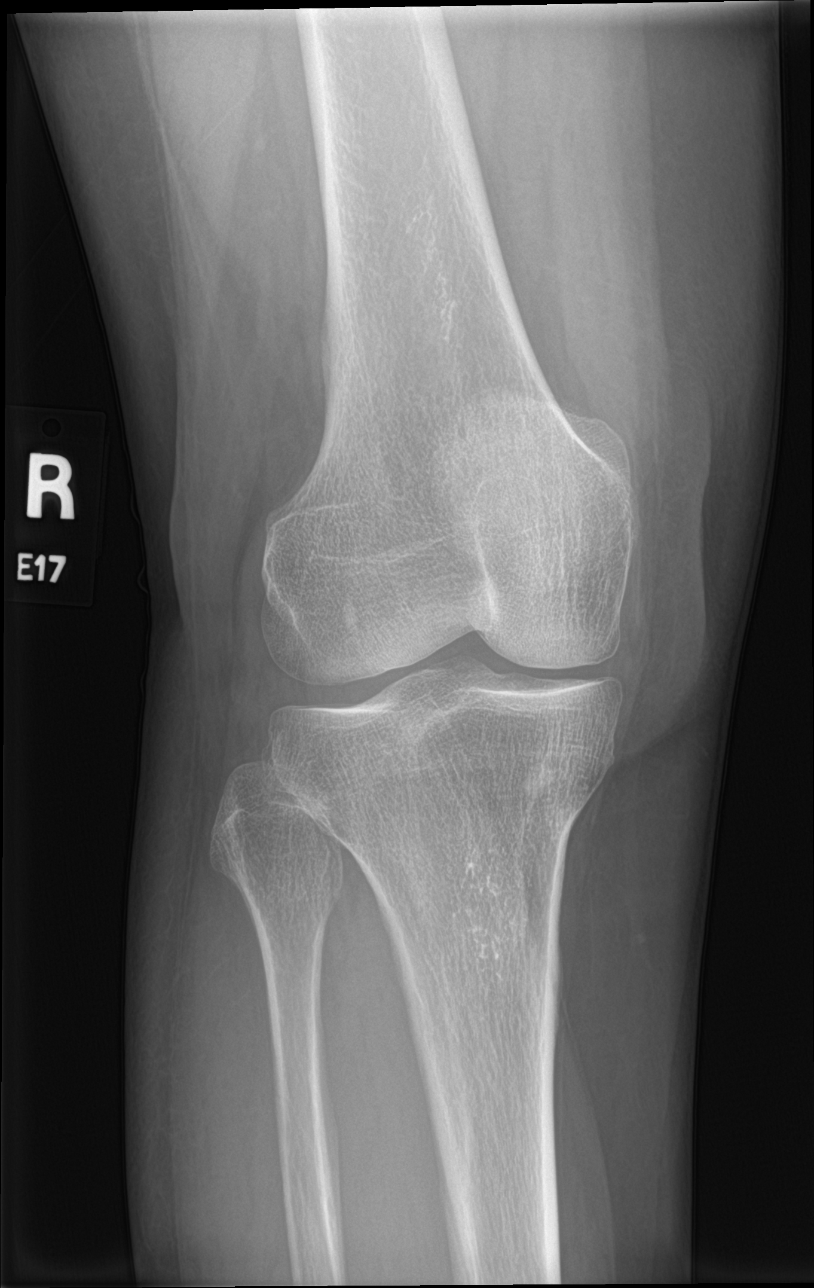

[knee obl (2 of 2)]
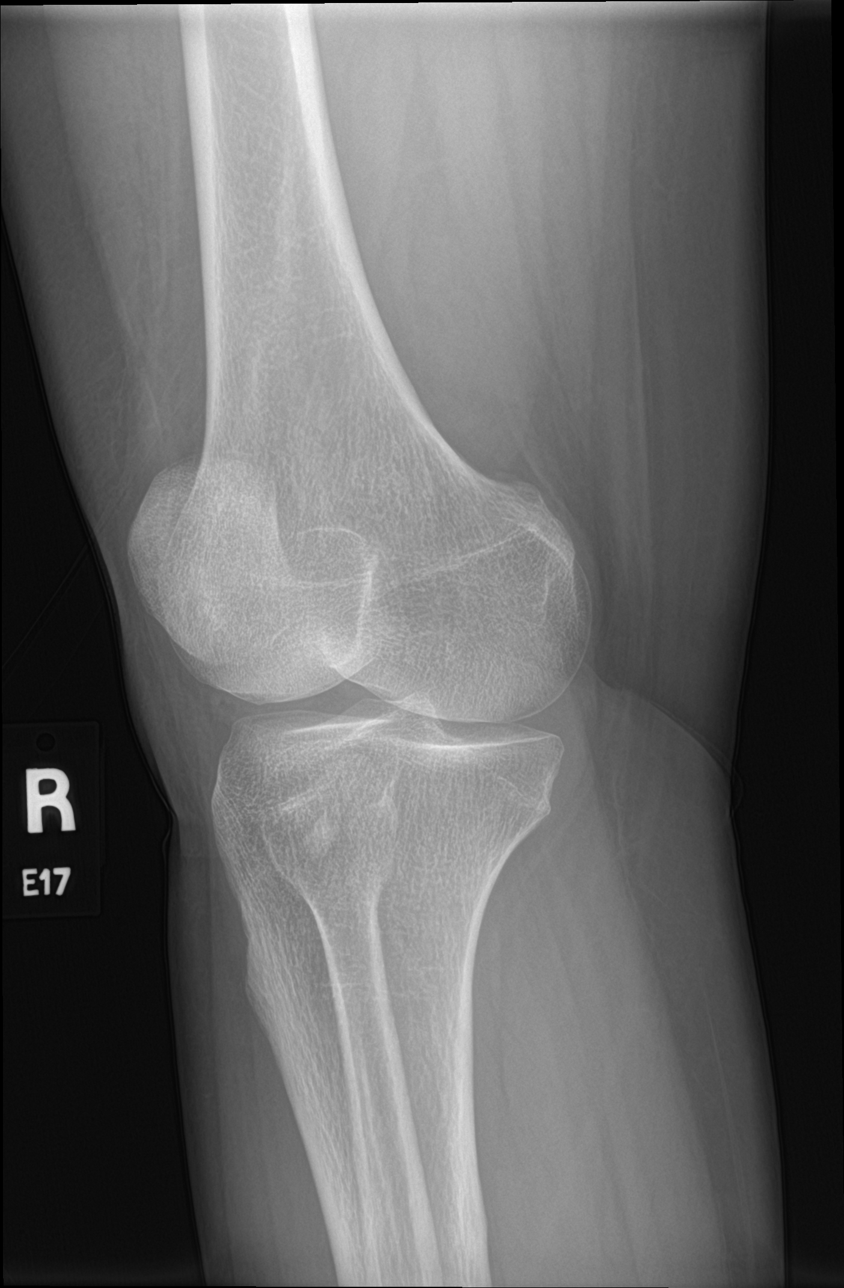

[4 of 4 positions shown; findings below may reference images not displayed]

FINDINGS: An oval sclerotic focus is seen along the anterior aspect of the
tibial plateau, likely a bone island. No other abnormalities. No
cause for pain identified.
IMPRESSION: An oval sclerotic focus in the anterior aspect of the tibial plateau
on the right is favored to represent a bone island. No other
abnormalities.

## 2020-12-10 IMAGING — DX DG KNEE COMPLETE 4+V*L*
4 series · 4 of 4 positions shown · non-contrast
Comparison: None.

CLINICAL DATA: Bilateral knee pain.

EXAM:
LEFT KNEE - COMPLETE 4+ VIEW

[knee ap]
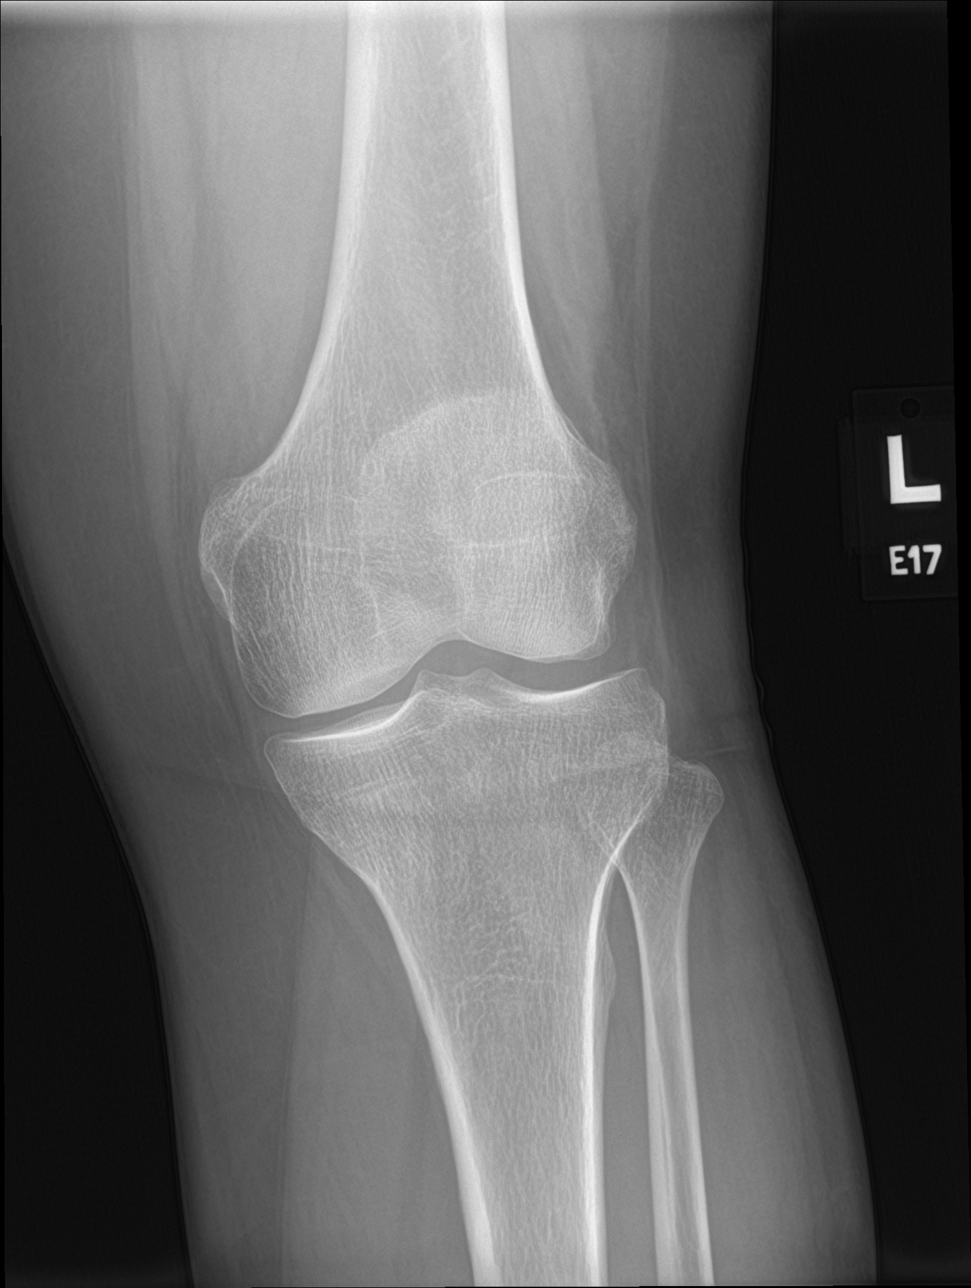

[knee lat]
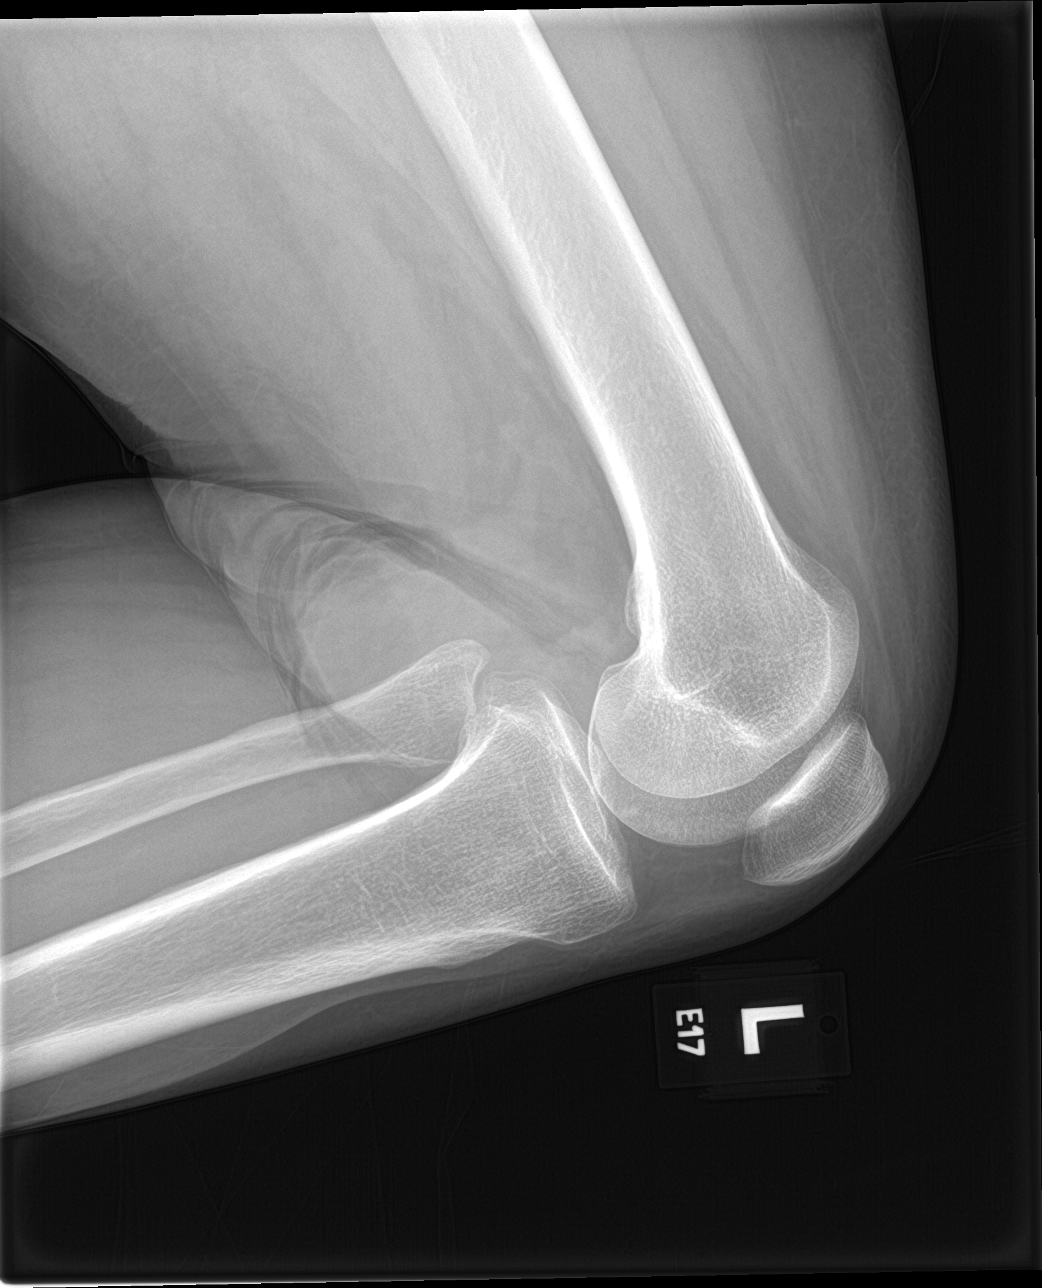

[knee obl (1 of 2)]
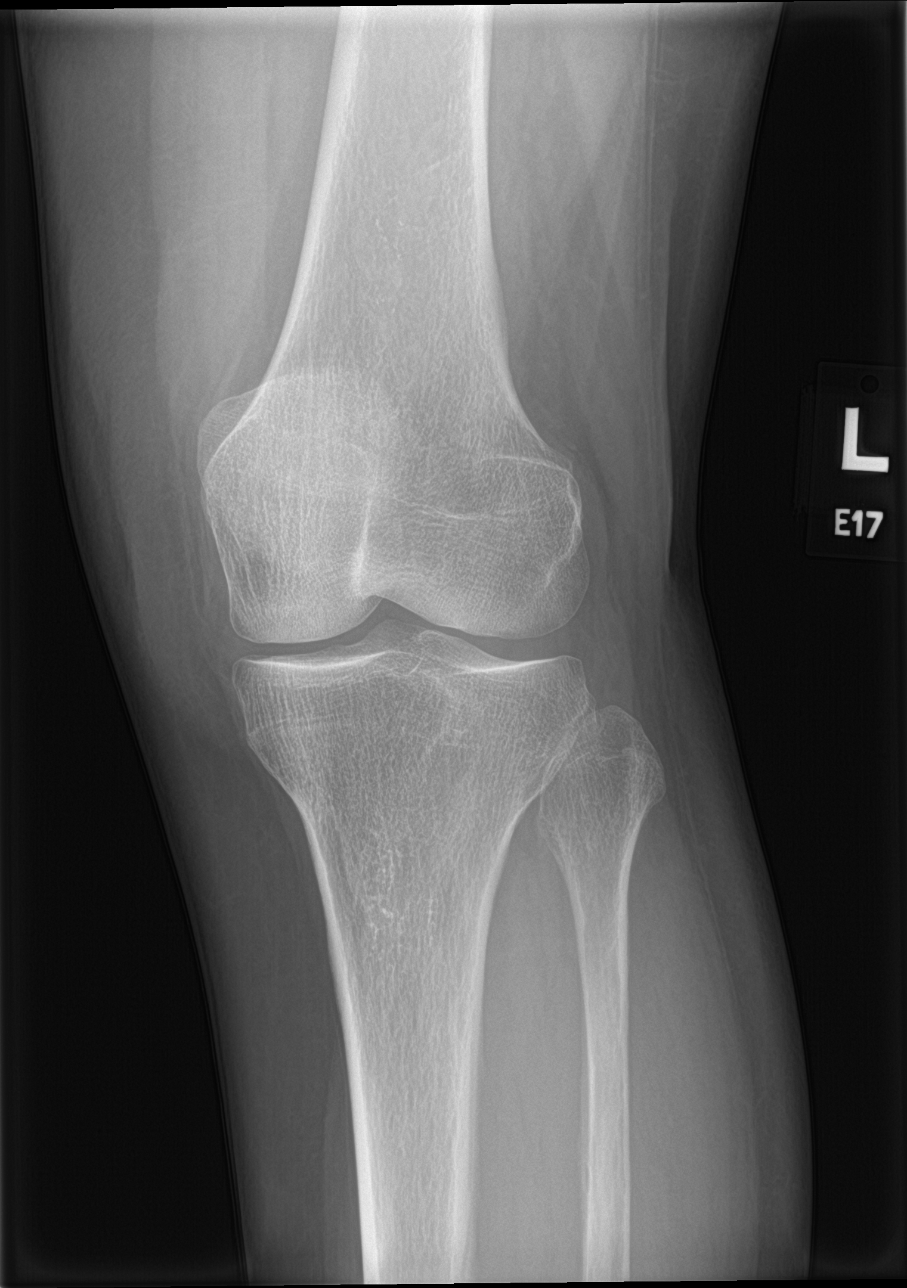

[knee obl (2 of 2)]
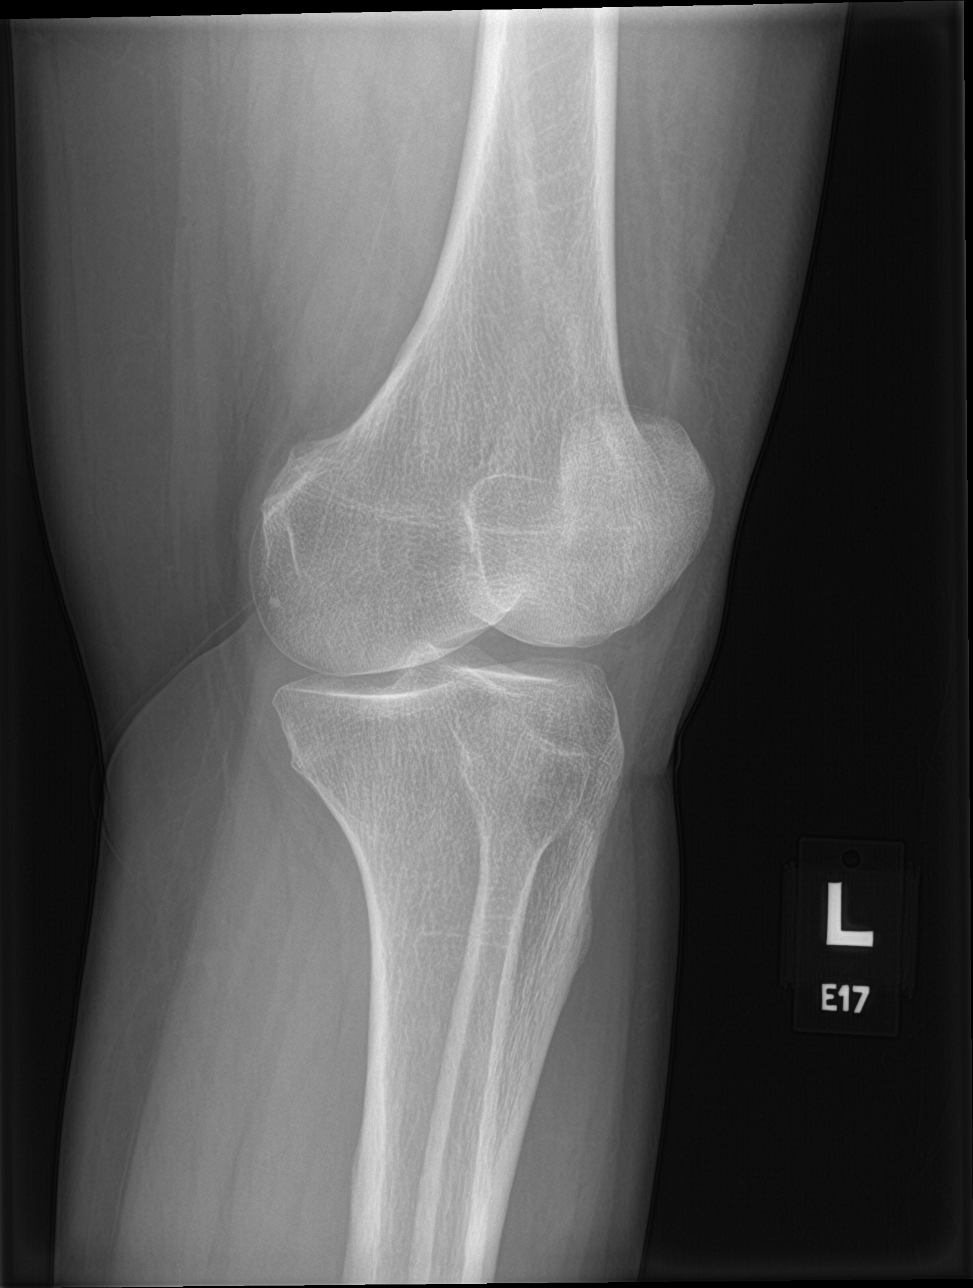

[4 of 4 positions shown; findings below may reference images not displayed]

FINDINGS: No evidence of fracture, dislocation, or joint effusion. No evidence
of arthropathy or other focal bone abnormality. Soft tissues are
unremarkable.
IMPRESSION: Negative.

## 2020-12-10 MED ORDER — SODIUM CHLORIDE 0.9% FLUSH
10.0000 mL | Freq: Once | INTRAVENOUS | Status: AC
  Administered 2020-12-10: 10 mL via INTRAVENOUS

## 2020-12-10 MED ORDER — ERGOCALCIFEROL 1.25 MG (50000 UT) PO CAPS: 50000.0000 [IU] | ORAL_CAPSULE | ORAL | 1 refills | Status: DC

## 2020-12-10 MED ORDER — HEPARIN SOD (PORK) LOCK FLUSH 100 UNIT/ML IV SOLN
500.0000 [IU] | Freq: Once | INTRAVENOUS | Status: AC
  Administered 2020-12-10: 500 [IU] via INTRAVENOUS

## 2020-12-10 MED ORDER — ALPRAZOLAM 0.25 MG PO TABS: 0.2500 mg | ORAL_TABLET | Freq: Every day | ORAL | 0 refills | Status: DC | PRN

## 2020-12-10 MED ORDER — PREGABALIN 75 MG PO CAPS: 75.0000 mg | ORAL_CAPSULE | Freq: Two times a day (BID) | ORAL | 3 refills | Status: DC

## 2020-12-10 NOTE — Telephone Encounter (Signed)
Appts made per 12/10/20 los, pt advised that she is having her port taken out 10/24 and req all appts to be after that and is aware that dr Marin Olp is away week of 10/24

## 2020-12-10 NOTE — Telephone Encounter (Signed)
Notified pt of results. Pt request to have Vit D called into her pharmacy [facility]. No further concerns

## 2020-12-10 NOTE — Telephone Encounter (Signed)
-----   Message from Stephanie Napoleon, MD sent at 12/10/2020  3:08 PM EDT ----- Call and tell her that the vitamin D level is very low.  She probably needs the 50,000 unit weekly dose.  Please call that in for her.  Thanks.  [name]

## 2020-12-10 NOTE — Progress Notes (Signed)
Hematology and Oncology Follow Up Visit  Stephanie Chase [id number] [birth year] 60 y.o. 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 6 months- next dose in 01/2021     Interim History:  Stephanie Chase is back for follow-up.  Unfortunately she still have some issues with respect to pain.  It is really hard for Stephanie to work.  She did have a lot of problems with Stephanie back.  I know that she had a recent bone density test which showed some osteoporosis.  We are going to have to get an MRI of Stephanie back to see if she has any kind of compression fractures secondary to osteoporosis.  Stephanie vitamin D level is quite low.  I think it is 23.  I think she will need to have the 50,000 unit weekly dose.  I suppose this might help with Stephanie pain.  She is having what sounds like neuropathic issues with the right arm and chest wall.  I will see if Lyrica might be able to help with this.  It is just hard for Stephanie to work.  I suspect that she is probably can have to be out of work.  She just has no stamina.  She is on quite a few medications.  She just is not able to focus and concentrate on work.  Stephanie appetite is okay.  She has had no nausea or vomiting.  She has had no issues with bowels or bladder.  There is no bleeding.  It is important for Stephanie to help Stephanie Chase.  Stephanie mother lives up in [place].  I think Stephanie Chase will be going back up there in a week or so to try to help out.  Stephanie Chase also is having knee pain.  I do not know if this is arthritic.  I will know if this might be ligamentous.  We will see about some plain films.    Currently, I would say Stephanie performance status is ECOG 2.     Medications:  Current Outpatient Medications:    acetaminophen (TYLENOL) 500 MG tablet, Take 500-1,000 mg by mouth every 6 (six) hours as needed for moderate pain. , Disp: , Rfl:    albuterol (VENTOLIN HFA) 108 (90 Base) MCG/ACT inhaler, Inhale 2 puffs into the lungs every 6 (six) hours as needed for wheezing or shortness of breath., Disp: 8 g, Rfl: 2   baclofen (LIORESAL) 10 MG tablet, TAKE 1 TABLET BY MOUTH TWICE A DAY AS NEEDED FOR MUSCLE SPASM, Disp: 30 tablet, Rfl: 3   fluticasone (FLONASE) 50 MCG/ACT nasal spray, Place 2 sprays into both nostrils in the morning and at bedtime., Disp: 16 g, Rfl: 5   levothyroxine (SYNTHROID) 75 MCG tablet, Take 75 mcg by mouth daily before breakfast., Disp: , Rfl:    lidocaine-prilocaine (EMLA) cream, Apply 1 application topically as needed., Disp: 30 g, Rfl: 1   liothyronine (CYTOMEL) 5 MCG tablet, TAKE 2 TABLETS (=10MCG     TOTAL)     DAILY, Disp: 180 tablet, Rfl: 1   pregabalin (LYRICA) 75 MG capsule, Take 1 capsule (75 mg total) by mouth 2 (two) times daily., Disp: 60 capsule, Rfl: 3   ALPRAZolam (XANAX) 0.25 MG tablet, Take 1 tablet (  0.25 mg total) by mouth daily as needed for anxiety., Disp: 30 tablet, Rfl: 0   Dexlansoprazole (DEXILANT) 30 MG capsule, Take 1 capsule (30 mg total) by mouth daily. (Patient not taking: Reported on 12/10/2020), Disp: 30 capsule, Rfl: 1   DULoxetine (CYMBALTA) 20 MG capsule, TAKE 1 CAPSULE BY MOUTH EVERY DAY (Patient not taking: No sig reported), Disp: 90 capsule, Rfl: 1   ergocalciferol (VITAMIN D2) 1.25 MG (50000 UT) capsule, Take 1 capsule (50,000 Units total) by mouth once a week., Disp: 30 capsule, Rfl: 1   fluticasone (FLOVENT HFA) 110 MCG/ACT inhaler, Inhale 2 puffs into the lungs in the morning and at bedtime., Disp: 1 each, Rfl: 12   LINZESS 72 MCG capsule, TAKE 1 CAPSULE BY MOUTH DAILY BEFORE BREAKFAST. (Patient not taking: No sig reported), Disp: 30 capsule, Rfl: 3   ondansetron (ZOFRAN) 8 MG tablet, Take 1  tablet (8 mg total) by mouth every 8 (eight) hours as needed. (Patient not taking: Reported on 12/10/2020), Disp: 30 tablet, Rfl: 2   sodium fluoride (FLUORISHIELD) 1.1 % GEL dental gel, Sodium Fluoride 5000 Dry Mouth 1.1 % dental paste  USE AS DIRECTED, Disp: , Rfl:  No current facility-administered medications for this visit.  Facility-Administered Medications Ordered in Other Visits:    alum & mag hydroxide-simeth (MAALOX/MYLANTA) 200-200-20 MG/5ML suspension 30 mL, 30 mL, Oral, Once, Stephanie Chase, Lyndon Code., PA-C  Allergies:  Allergies  Allergen Reactions   Bactrim [Sulfamethoxazole-Trimethoprim] Itching and Rash    Past Medical History, Surgical history, Social history, and Family History were reviewed and updated.  Review of Systems: Review of Systems  Constitutional:  Positive for fatigue.  HENT:   Positive for hearing loss and tinnitus.   Eyes:  Positive for eye problems.  Respiratory:  Positive for cough.   Cardiovascular: Negative.   Gastrointestinal:  Positive for abdominal pain and nausea.  Endocrine: Negative.   Genitourinary: Negative.    Musculoskeletal:  Positive for arthralgias and myalgias.  Skin: Negative.   Neurological:  Positive for light-headedness.  Hematological: Negative.   Psychiatric/Behavioral: Negative.     Physical Exam:  weight is 172 lb 6.4 oz (78.2 kg). Stephanie oral temperature is 98.4 F (36.9 C). Stephanie blood pressure is 119/79 and Stephanie pulse is 88. Stephanie respiration is 20 and oxygen saturation is 100%.   Wt Readings from Last 3 Encounters:  12/10/20 172 lb 6.4 oz (78.2 kg)  10/25/20 172 lb (78 kg)  09/22/20 173 lb 1.9 oz (78.5 kg)    Physical Exam Vitals reviewed.  Constitutional:      Comments: She has had bilateral mastectomies.  Mastectomy scars are well-healed.  There might be some nodularity along the mastectomy site.  Again I have to believe this is scar tissue.  There is no erythema or warmth or swelling associated with these.  I cannot palpate any  bilateral axillary lymph nodes.  HENT:     Head: Normocephalic and atraumatic.  Eyes:     Pupils: Pupils are equal, round, and reactive to light.  Cardiovascular:     Rate and Rhythm: Normal rate and regular rhythm.     Heart sounds: Normal heart sounds.  Pulmonary:     Effort: Pulmonary effort is normal.     Breath sounds: Normal breath sounds.  Abdominal:     General: Bowel sounds are normal.     Palpations: Abdomen is soft.  Musculoskeletal:        General: No tenderness or deformity. Normal range of motion.  Cervical back: Normal range of motion.  Lymphadenopathy:     Cervical: No cervical adenopathy.  Skin:    General: Skin is warm and dry.     Findings: No erythema or rash.  Neurological:     Mental Status: She is alert and oriented to person, place, and time.  Psychiatric:        Behavior: Behavior normal.        Thought Content: Thought content normal.        Judgment: Judgment normal.   Lab Results  Component Value Date   WBC 8.8 12/10/2020   HGB 14.4 12/10/2020   HCT 43.5 12/10/2020   MCV 85.5 12/10/2020   PLT 308 12/10/2020     Chemistry      Component Value Date/Time   NA 136 12/10/2020 0925   K 3.8 12/10/2020 0925   CL 102 12/10/2020 0925   CO2 24 12/10/2020 0925   BUN 10 12/10/2020 0925   CREATININE 0.71 12/10/2020 0925   CREATININE 0.66 09/12/2017 0747      Component Value Date/Time   CALCIUM 10.2 12/10/2020 0925   ALKPHOS 64 12/10/2020 0925   AST 19 12/10/2020 0925   ALT 15 12/10/2020 0925   BILITOT 0.4 12/10/2020 0925      Impression and Plan: Ms. Sousa is is a very nice 60 year old postmenopausal female.  She is originally from [place].  She moved down to [facility].  She has 2 grandchildren already.  Stephanie children live on the [place].  She has early stage breast cancer.  Unfortunately, it is Stephanie-2 positive.  Because this, she required chemotherapy with Herceptin.  Because of the chemotherapy, she has had side  effects.  So far, these side effects have not let up.  I am not sure if she will ever have a significant improvement in Stephanie quality of life.  I just hate that she keeps having these issues.  I just am not sure what else we can do about the problems that she has.  Maybe the Lyrica will help with Stephanie pain in the right arm.  I just do not think that she is ready for an aromatase inhibitor.  I does worry that this is going to further exacerbate Stephanie bony issues.  We may have to think about tamoxifen for Stephanie.  I just do not think that she is ready to start anything oral.    We will plan to get Stephanie back in another 6 weeks or so.  We will see how she is feeling then.  Hopefully, she will be having less problems with pain.  [name], MD 9/16/20225:31 PM

## 2020-12-10 NOTE — Patient Instructions (Signed)
Tunneled Central Venous Catheter Flushing Guide It is important to flush your tunneled central venous catheter each time you use it, both before and after you use it. Flushing your catheter will help prevent it from clogging. What are the risks? Risks may include: Infection. Air getting into the catheter and bloodstream. Supplies needed: A clean pair of gloves. A disinfecting wipe. Use an alcohol wipe, chlorhexidine wipe, or iodine wipe as told by your health care provider. A 10 mL syringe that has been prefilled with saline solution. An empty 10 mL syringe, if a substance called heparin was injected into your catheter. How to flush your catheter When you flush your catheter, make sure you follow any specific instructions from your health care provider or the manufacturer. These are general guidelines. Flushing your catheter before use If there is heparin in your catheter: Wash your hands with soap and water. Put on gloves. Scrub the injection cap for a minimum of 15 seconds with a disinfecting wipe. Unclamp the catheter. Attach the empty syringe to the injection cap. Pull the syringe plunger back and withdraw 10 mL of blood. Place the syringe into an appropriate waste container. Scrub the injection cap for 15 seconds with a disinfecting wipe. Attach the prefilled syringe to the injection cap. Flush the catheter by pushing the plunger forward until all the liquid from the syringe is in the catheter. Remove the syringe from the injection cap. Clamp the catheter. If there is no heparin in your catheter: Wash your hands with soap and water. Put on gloves. Scrub the injection cap for 15 seconds with a disinfecting wipe. Unclamp the catheter. Attach the prefilled syringe to the injection cap. Flush the catheter by pushing the plunger forward until 5 mL of the liquid from the syringe is in the catheter. Pull back on the syringe until you see blood in the catheter. If you have been asked  to collect any blood, follow your health care provider's instructions. Otherwise, flush the catheter with the rest of the solution from the syringe. Remove the syringe from the injection cap. Clamp the catheter.  Flushing your catheter after use Wash your hands with soap and water. Put on gloves. Scrub the injection cap for 15 seconds with a disinfecting wipe. Unclamp the catheter. Attach the prefilled syringe to the injection cap. Flush the catheter by pushing the plunger forward until all of the liquid from the syringe is in the catheter. Remove the syringe from the injection cap. Clamp the catheter. Problems and solutions If blood cannot be completely cleared from the injection cap, you may need to have the injection cap replaced. If the catheter is difficult to flush, use the pulsing method. The pulsing method involves pushing only a few milliliters of solution into the catheter at a time and pausing between pushes. If you do not see blood in the catheter when you pull back on the syringe, change your body position, such as by raising your arms above your head. Take a deep breath and cough. Then, pull back on the syringe. If you still do not see blood, flush the catheter with a small amount of solution. Then, change positions again and take a breath or cough. Pull back on the syringe again. If you still do not see blood, finish flushing the catheter and contact your health care provider. Do not use your catheter until your health care provider says it is okay. General tips Have someone help you flush your catheter, if possible. Do not force fluid   through your catheter. Do not use a syringe that is larger or smaller than 10 mL. Using a smaller syringe can make the catheter burst. Do not use your catheter without flushing it first if it has heparin in it. Contact a health care provider if: You cannot see any blood in the catheter when you flush it before using it. Your catheter is difficult  to flush. Get help right away if: You cannot flush the catheter. The catheter leaks when you flush it or when there is fluid in it. There are cracks or breaks in the catheter. Summary It is important to flush your tunneled central venous catheter each time you use it, both before and after you use it. Scrub the injection cap for 15 seconds with a disinfecting wipe before and after you flush it. When you flush your catheter, make sure you follow any specific instructions from your health care provider or the manufacturer. Get help right away if you cannot flush the catheter. This information is not intended to replace advice given to you by your health care provider. Make sure you discuss any questions you have with your health care provider. Document Revised: 05/22/2019 Document Reviewed: 05/29/2018 Elsevier Patient Education  2022 Elsevier Inc.  

## 2020-12-11 ENCOUNTER — Other Ambulatory Visit: Payer: Self-pay | Admitting: Hematology & Oncology

## 2020-12-11 ENCOUNTER — Encounter: Payer: Self-pay | Admitting: Hematology & Oncology

## 2020-12-11 LAB — CANCER ANTIGEN 27.29: CA 27.29: 16.5 U/mL (ref 0.0–38.6)

## 2020-12-13 ENCOUNTER — Other Ambulatory Visit: Payer: Self-pay

## 2020-12-13 ENCOUNTER — Ambulatory Visit (INDEPENDENT_AMBULATORY_CARE_PROVIDER_SITE_OTHER)
Admission: RE | Admit: 2020-12-13 | Discharge: 2020-12-13 | Disposition: A | Discharge status: Home / Self Care | Payer: Self-pay | Source: Ambulatory Visit | Attending: Internal Medicine | Admitting: Internal Medicine

## 2020-12-13 ENCOUNTER — Telehealth: Payer: Self-pay | Admitting: *Deleted

## 2020-12-13 ENCOUNTER — Encounter: Payer: Self-pay | Admitting: Hematology

## 2020-12-13 ENCOUNTER — Encounter: Payer: Self-pay | Admitting: Hematology & Oncology

## 2020-12-13 DIAGNOSIS — Z720 Tobacco use: Secondary | ICD-10-CM

## 2020-12-13 IMAGING — CT CT CARDIAC CORONARY ARTERY CALCIUM SCORE
3 series · 14 of 20 positions shown, 16 images · non-contrast
Comparison: PET-CT [DATE].  Remote prior chest CTA [DATE].
COMPARISON: PET-CT [DATE].  Remote prior chest CTA [DATE].

Addendum:
EXAM:
OVER-READ INTERPRETATION  CT CHEST

The following report is an over-read performed by radiologist Dr.
NAHUSENAY [REDACTED] on [DATE]. This
over-read does not include interpretation of cardiac or coronary
anatomy or pathology. The coronary calcium score interpretation by
the cardiologist is attached.
CLINICAL DATA: Risk stratification
Coronary Calcium Score
TECHNIQUE: The patient was scanned on a Siemens Somatom 64 slice scanner. Axial
non-contrast 3 mm slices were carried out through the heart. The
data set was analyzed on a dedicated work station and scored using
the Agatson method.

[Series 2: cascseq 2.0 sa36 70% (id) · axial · 0.39mm/px · z∈[-249,-169]mm · 4 of 68 slices shown]
[im 14/68  vessel]
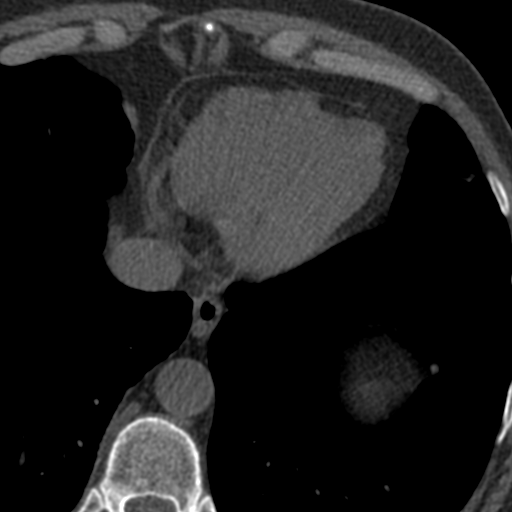
[im 27/68  vessel]
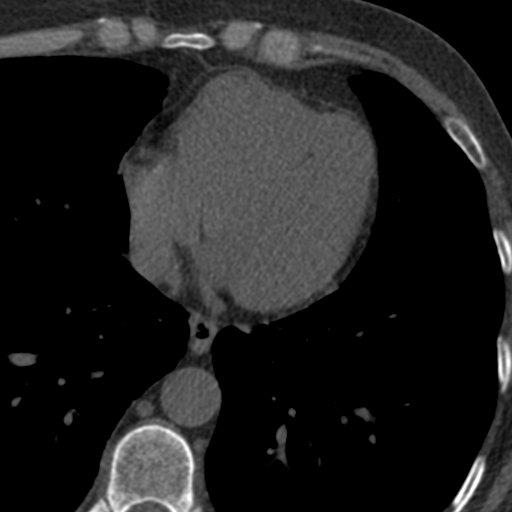
[im 41/68  vessel]
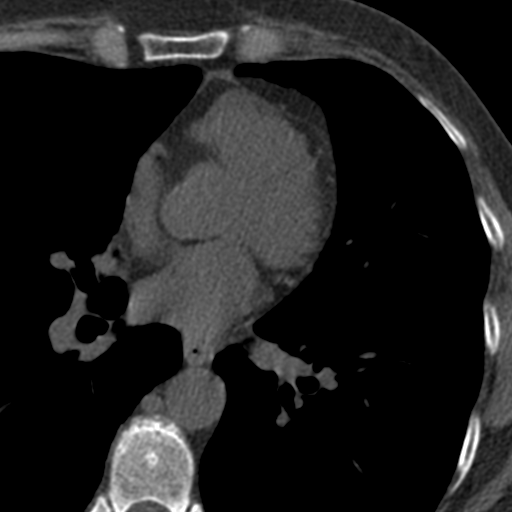
[im 54/68  vessel]
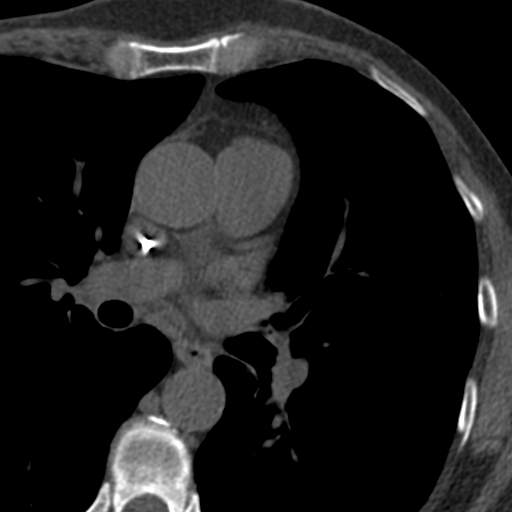

[Series 3: cascseq 2.0 bf37 st · axial · 0.64mm/px · z∈[-253,-165]mm · 5 of 68 slices shown, 7 images]
[im 12/68  vessel]
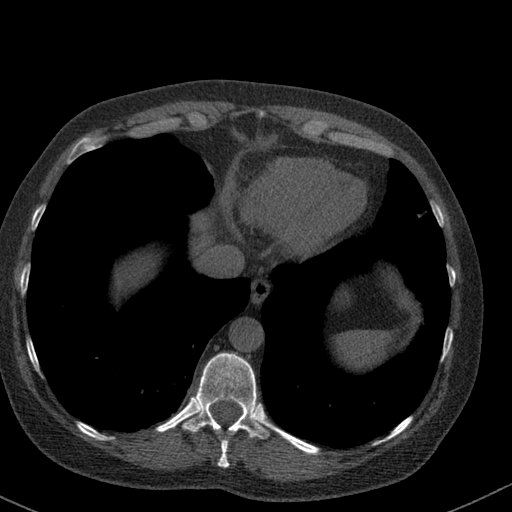
[im 12/68  lung]
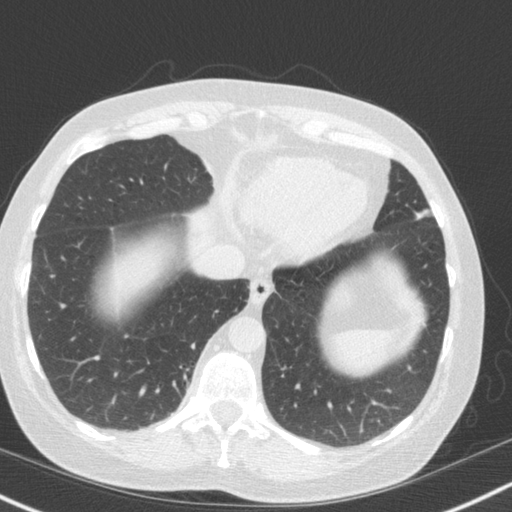
[im 23/68  vessel]
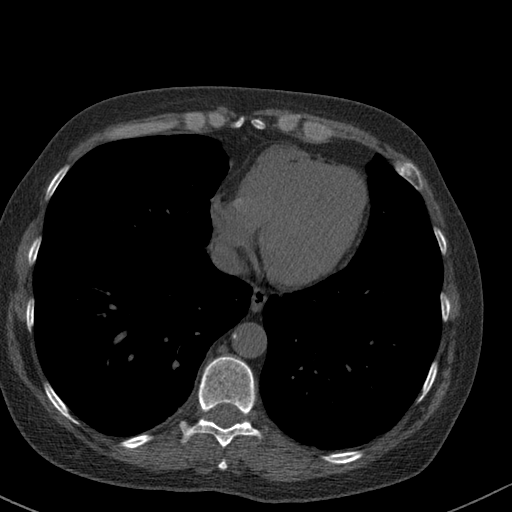
[im 34/68  vessel]
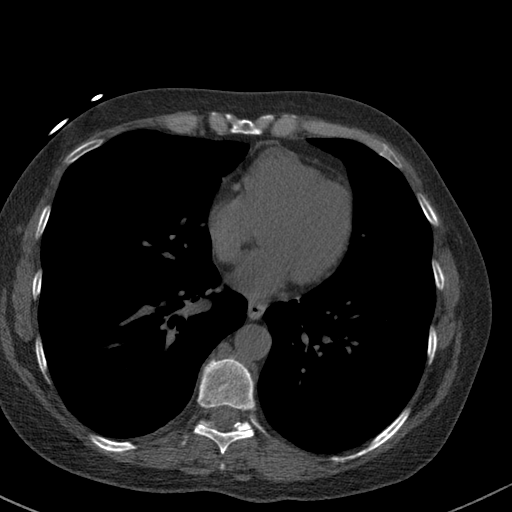
[im 45/68  vessel]
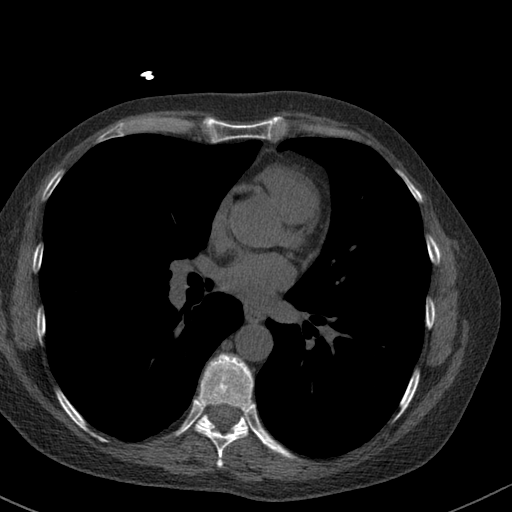
[im 56/68  vessel]
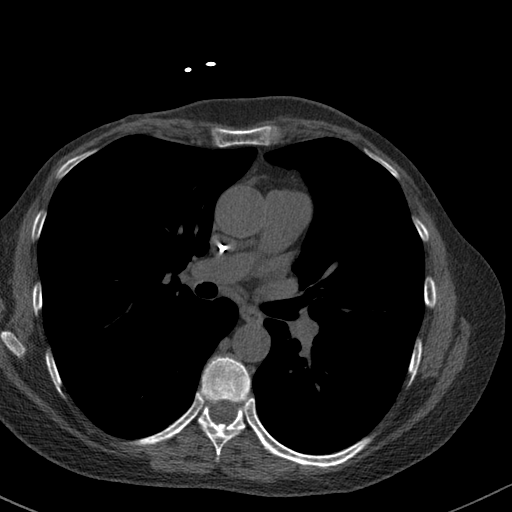
[im 56/68  lung]
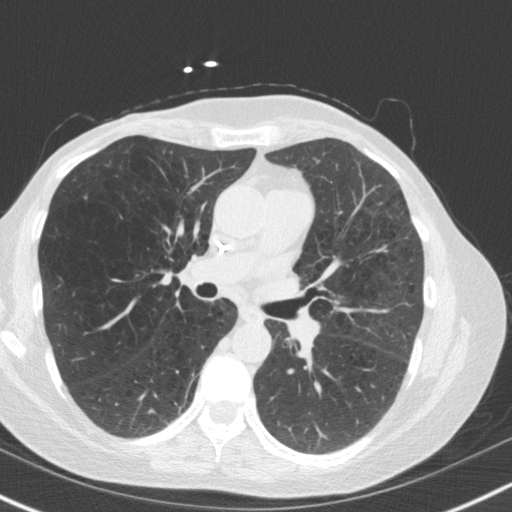

[Series 4: cascseq 2.0 br59 lung · axial · 0.64mm/px · z∈[-253,-165]mm · 5 of 68 slices shown]
[im 12/68  lung]
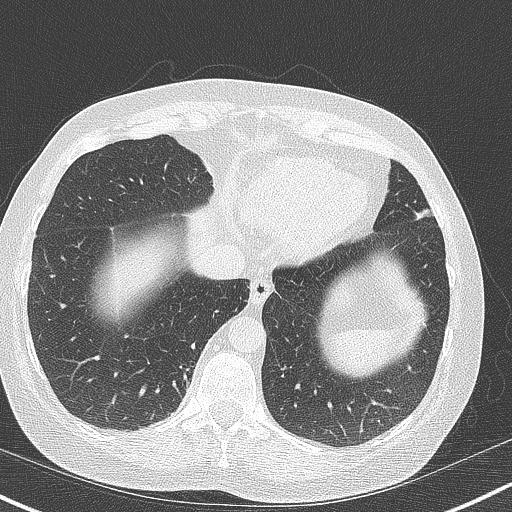
[im 23/68  lung]
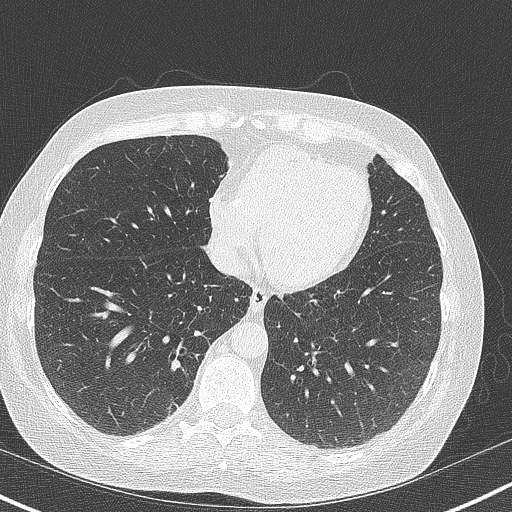
[im 34/68  lung]
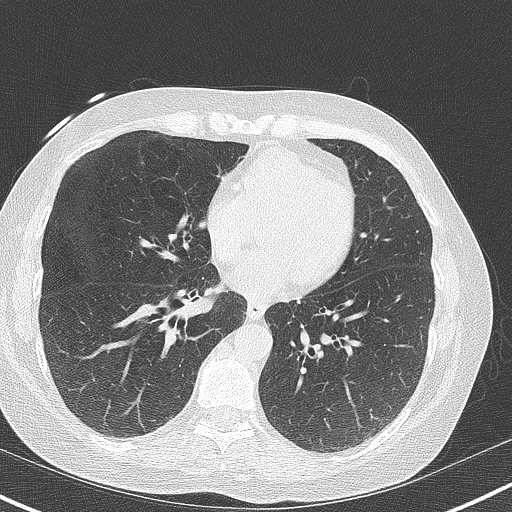
[im 45/68  lung]
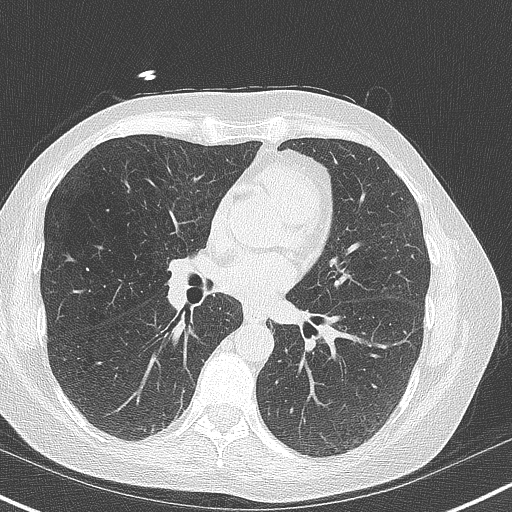
[im 56/68  lung]
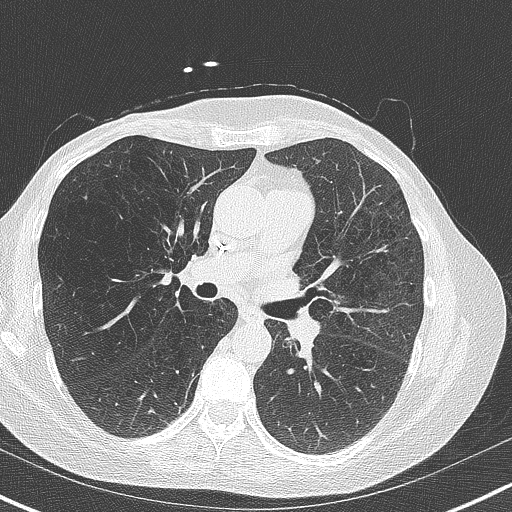

[14 of 20 positions shown; findings below may reference images not displayed]

FINDINGS: Atherosclerotic calcifications in the thoracic aorta. Central venous
catheter tip terminating at the superior cavoatrial junction.
Multiple small pulmonary nodules are again noted, similar in size
and number on prior examinations dating back to [P1], considered
definitively benign, largest of which is in the right middle lobe
(axial image 51 of series 4) measuring 11 x 6 mm. Diffuse bronchial
wall thickening with severe centrilobular and paraseptal emphysema.
Within the visualized portions of the thorax there are no other
suspicious appearing pulmonary nodules or masses, there is no acute
consolidative airspace disease, no pleural effusions, no
pneumothorax and no lymphadenopathy. Visualized portions of the
upper abdomen are unremarkable. There are no aggressive appearing
lytic or blastic lesions noted in the visualized portions of the
skeleton. Status post bilateral modified radical mastectomy.
IMPRESSION: 1.  Aortic Atherosclerosis ([P1]-[P1]).
2. Small pulmonary nodules stable dating back to [P1], considered
definitively benign.
3.  Emphysema ([P1]-[P1]).
FINDINGS: Non-cardiac: See separate report from [REDACTED].

Ascending aorta: Normal diameter 3.2 cm

Pericardium: Normal

Coronary arteries: 3 vessel calcium noted

LM: 0

LAD

RCA:

LCX:

Total:
IMPRESSION: Coronary calcium score of 15.6. This was 77 th percentile for age
and sex matched control.

NAHUSENAY

*** End of Addendum ***
EXAM:
OVER-READ INTERPRETATION  CT CHEST

The following report is an over-read performed by radiologist Dr.
NAHUSENAY [REDACTED] on [DATE]. This
over-read does not include interpretation of cardiac or coronary
anatomy or pathology. The coronary calcium score interpretation by
the cardiologist is attached.
FINDINGS: Atherosclerotic calcifications in the thoracic aorta. Central venous
catheter tip terminating at the superior cavoatrial junction.
Multiple small pulmonary nodules are again noted, similar in size
and number on prior examinations dating back to [P1], considered
definitively benign, largest of which is in the right middle lobe
(axial image 51 of series 4) measuring 11 x 6 mm. Diffuse bronchial
wall thickening with severe centrilobular and paraseptal emphysema.
Within the visualized portions of the thorax there are no other
suspicious appearing pulmonary nodules or masses, there is no acute
consolidative airspace disease, no pleural effusions, no
pneumothorax and no lymphadenopathy. Visualized portions of the
upper abdomen are unremarkable. There are no aggressive appearing
lytic or blastic lesions noted in the visualized portions of the
skeleton. Status post bilateral modified radical mastectomy.
IMPRESSION: 1.  Aortic Atherosclerosis ([P1]-[P1]).
2. Small pulmonary nodules stable dating back to [P1], considered
definitively benign.
3.  Emphysema ([P1]-[P1]).

## 2020-12-13 MED ORDER — ALPRAZOLAM 0.25 MG PO TABS: 0.2500 mg | ORAL_TABLET | Freq: Every day | ORAL | 0 refills | Status: DC | PRN

## 2020-12-13 NOTE — Telephone Encounter (Signed)
-----   Message from [name], MD sent at 12/13/2020  6:10 AM EDT ----- Call -- the knee x-rays look ok.  [name]

## 2020-12-13 NOTE — Telephone Encounter (Signed)
As noted below by Dr. Marin Olp, I informed the patient that the X-rays of her knee look OK. She verbalized understanding.

## 2020-12-14 ENCOUNTER — Other Ambulatory Visit: Payer: BC Managed Care – PPO

## 2020-12-14 ENCOUNTER — Ambulatory Visit: Payer: BC Managed Care – PPO | Admitting: Hematology & Oncology

## 2020-12-16 ENCOUNTER — Encounter (HOSPITAL_COMMUNITY): Payer: Self-pay | Admitting: Internal Medicine

## 2020-12-16 ENCOUNTER — Ambulatory Visit (HOSPITAL_BASED_OUTPATIENT_CLINIC_OR_DEPARTMENT_OTHER)
Admission: RE | Admit: 2020-12-16 | Discharge: 2020-12-16 | Disposition: A | Discharge status: Home / Self Care | Payer: BC Managed Care – PPO | Source: Ambulatory Visit

## 2020-12-16 ENCOUNTER — Ambulatory Visit (HOSPITAL_COMMUNITY)
Admission: RE | Admit: 2020-12-16 | Discharge: 2020-12-16 | Disposition: A | Discharge status: Home / Self Care | Payer: BC Managed Care – PPO | Source: Ambulatory Visit | Attending: Internal Medicine | Admitting: Internal Medicine

## 2020-12-16 ENCOUNTER — Other Ambulatory Visit: Payer: Self-pay

## 2020-12-16 VITALS — BP 110/70 | HR 83 | Wt 173.6 lb

## 2020-12-16 DIAGNOSIS — E039 Hypothyroidism, unspecified: Secondary | ICD-10-CM | POA: Insufficient documentation

## 2020-12-16 DIAGNOSIS — I2510 Atherosclerotic heart disease of native coronary artery without angina pectoris: Secondary | ICD-10-CM | POA: Diagnosis not present

## 2020-12-16 DIAGNOSIS — Z720 Tobacco use: Secondary | ICD-10-CM | POA: Diagnosis not present

## 2020-12-16 DIAGNOSIS — C50011 Malignant neoplasm of nipple and areola, right female breast: Secondary | ICD-10-CM | POA: Insufficient documentation

## 2020-12-16 DIAGNOSIS — Z9011 Acquired absence of right breast and nipple: Secondary | ICD-10-CM | POA: Diagnosis not present

## 2020-12-16 DIAGNOSIS — Z170 Estrogen receptor positive status [ER+]: Secondary | ICD-10-CM | POA: Diagnosis not present

## 2020-12-16 DIAGNOSIS — Z881 Allergy status to other antibiotic agents status: Secondary | ICD-10-CM | POA: Insufficient documentation

## 2020-12-16 DIAGNOSIS — Z79899 Other long term (current) drug therapy: Secondary | ICD-10-CM | POA: Insufficient documentation

## 2020-12-16 DIAGNOSIS — Z0189 Encounter for other specified special examinations: Secondary | ICD-10-CM

## 2020-12-16 DIAGNOSIS — E782 Mixed hyperlipidemia: Secondary | ICD-10-CM

## 2020-12-16 DIAGNOSIS — F17210 Nicotine dependence, cigarettes, uncomplicated: Secondary | ICD-10-CM | POA: Diagnosis not present

## 2020-12-16 DIAGNOSIS — E785 Hyperlipidemia, unspecified: Secondary | ICD-10-CM | POA: Insufficient documentation

## 2020-12-16 DIAGNOSIS — Z01818 Encounter for other preprocedural examination: Secondary | ICD-10-CM | POA: Insufficient documentation

## 2020-12-16 DIAGNOSIS — J449 Chronic obstructive pulmonary disease, unspecified: Secondary | ICD-10-CM | POA: Insufficient documentation

## 2020-12-16 DIAGNOSIS — L930 Discoid lupus erythematosus: Secondary | ICD-10-CM | POA: Diagnosis not present

## 2020-12-16 LAB — ECHOCARDIOGRAM COMPLETE
Area-P 1/2: 3.5 cm2
S' Lateral: 3.1 cm

## 2020-12-16 MED ORDER — ROSUVASTATIN CALCIUM 10 MG PO TABS: 10.0000 mg | ORAL_TABLET | Freq: Every day | ORAL | 3 refills | Status: DC

## 2020-12-16 NOTE — Progress Notes (Signed)
  Echocardiogram 2D Echocardiogram has been performed.  Stephanie Chase M 12/16/2020, 1:42 PM

## 2020-12-16 NOTE — Progress Notes (Signed)
CARDIO-ONCOLOGY CLINIC NOTE  Referring Physician: Dr. Marin Olp Primary Care: Delsa Bern, MD   HPI:  Stephanie Chase is a 60 y.o. with COPD with ongoing tobacco use, discoid lupus, hypothyroidism, hyperlipidemia, right breast cancer referred by Dr. Burr Medico for enrollment into the Cardio-Oncology program.  Now following with Dr. Marin Olp  She was diagnosed with R breast CA in 4/21. StageIA, pT1cN0M0, ER+/HER2+, PR-, Grade III. Given multifocal disease, she underwent right mastectomy and SLNB by Dr Barry Dienes on 09/05/19. 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 Her margins were clear and she was node negative. Her left breast tissue was benign. Given her HER2 positive high grade disease she was started on weekly paclitaxel and trastuzumab for 12 weeks on 10/28/19, followed by maintenance trastuzumab q3weeks   Post op-course c/b non-healing surgical wound and was seen by Dr. Marla Roe.  Here for f/u. Finished herceptin 10/27/20. Under a lot of stress with her mother now on hospice and starting morphine. Also recently found a lump under her scar which they think is scar tissue. Denies HF symptoms.   Had Coronary calcium score 15 (77%)     Echo 12/16/20: EF 60-65% G1DD GLS -19.5 Personally reviewed Echo  08/26/20: EF 65-70% G1 DD GLS -20.15  Echo 05/26/20: EF 60-65% Grade 2 DD  Echo 02/26/20: EF 60-65% Grade 1DD  Echo 8/21 EF 60-65%  Echo: 5/21 EF 65-70% GLS -20.4%       Current Outpatient Medications  Medication Sig Dispense Refill   acetaminophen (TYLENOL) 500 MG tablet Take 500-1,000 mg by mouth every 6 (six) hours as needed for moderate pain.      albuterol (VENTOLIN HFA) 108 (90 Base) MCG/ACT inhaler Inhale 2 puffs into the lungs every 6 (six) hours as needed for wheezing or shortness of breath. 8 g 2   ALPRAZolam (XANAX) 0.25 MG tablet Take 1 tablet (0.25 mg total) by mouth daily as needed  for anxiety. 30 tablet 0   baclofen (LIORESAL) 10 MG tablet TAKE 1 TABLET BY MOUTH TWICE A DAY AS NEEDED FOR MUSCLE SPASM 30 tablet 3   ergocalciferol (VITAMIN D2) 1.25 MG (50000 UT) capsule Take 1 capsule (50,000 Units total) by mouth once a week. 30 capsule 1   fluticasone (FLONASE) 50 MCG/ACT nasal spray Place 2 sprays into both nostrils in the morning and at bedtime. 16 g 5   fluticasone (FLOVENT HFA) 110 MCG/ACT inhaler Inhale 2 puffs into the lungs in the morning and at bedtime. 1 each 12   levothyroxine (SYNTHROID) 75 MCG tablet Take 75 mcg by mouth daily before breakfast.     lidocaine-prilocaine (EMLA) cream Apply 1 application topically as needed. 30 g 1   liothyronine (CYTOMEL) 5 MCG tablet TAKE 2 TABLETS (=10MCG     TOTAL)     DAILY 180 tablet 1   ondansetron (ZOFRAN) 8 MG tablet Take 1 tablet (8 mg total) by mouth every 8 (eight) hours as needed. 30 tablet 2   pregabalin (LYRICA) 75 MG capsule Take 1 capsule (75 mg total) by mouth 2 (two) times daily. 60 capsule 3   No current facility-administered medications for this encounter.   Facility-Administered Medications Ordered in Other Encounters  Medication Dose Route Frequency Provider Last Rate Last Admin   alum & mag hydroxide-simeth (MAALOX/MYLANTA) 200-200-20 MG/5ML suspension 30 mL  30 mL Oral Once Harle Stanford., PA-C        Allergies  Allergen  Reactions   Bactrim [Sulfamethoxazole-Trimethoprim] Itching and Rash      Vitals:   12/16/20 1411  BP: 110/70  Pulse: 83  SpO2: 95%  Weight: 78.7 kg (173 lb 9.6 oz)    PHYSICAL EXAM: General:  Well appearing. No resp difficulty HEENT: normal Neck: supple. no JVD. Carotids 2+ bilat; no bruits. No lymphadenopathy or thryomegaly appreciated. Cor: PMI nondisplaced. Regular rate & rhythm. No rubs, gallops or murmurs. Lungs: decreased throughout Abdomen: soft, nontender, nondistended. No hepatosplenomegaly. No bruits or masses. Good bowel sounds. Extremities: no cyanosis,  clubbing, rash, edema Neuro: alert & orientedx3, cranial nerves grossly intact. moves all 4 extremities w/o difficulty. Affect pleasant  ASSESSMENT & PLAN:  1. Right Breast Cancer - She was diagnosed with R breast CA in 06/2019. StageIA, pT1cN0M0, ER+/HER2+, PR-, Grade III.  - Echo 5/21 EF 65-70% GLS -20.4% - Echo 11/24/19 EF 60-65% - Echo 02/26/20: EF 60-65% Grade 1DD - Echo 05/26/20: EF 60-65% Grade 2 DD Personally reviewed - Echo today 08/26/20: EF 65-70% G1 DD GLS -20.15 Personally reviewed - I reviewed echos personally. EF and Doppler parameters stable. No HF on exam. Has complete Herceptin. F/u PRN   2. Tobacco use - smokes ~1.0 ppd - not ready to quit in setting of severe stress  3. Hyperlipidemia - LDL 142 in 12/21 - followed by PCP - given elevated coronary calcium score will start crestor 10  4. Coronary calcifications - CT 9/22. Relative low absolute coronary calcium score of 15 but places her in 77% percentile for age/sex matched. Given tobaccos use will be aggressive with RF management. Start Crestor 10 daily with goal LDL < 70. Hopefully she can reduce smoking - f/u PCP   Glori Bickers, MD  2:16 PM

## 2020-12-16 NOTE — Patient Instructions (Signed)
No Labs done today.  START Crestor 10mg  (1 tablet) by mouth daily.  No other medication changes were made. Please continue all current medications as prescribed.  CONGRATULATIONS!!!!YOU HAVE GRADUATED FROM THE [facility]!!!  If you have any questions or concerns before your next appointment please send [place] a message through [place] or call our office at [phone].    TO LEAVE A MESSAGE FOR THE NURSE SELECT OPTION 2, PLEASE LEAVE A MESSAGE INCLUDING: YOUR NAME DATE OF BIRTH CALL BACK NUMBER REASON FOR CALL**this is important as we prioritize the call backs  YOU WILL RECEIVE A CALL BACK THE SAME DAY AS LONG AS YOU CALL BEFORE 4:00 PM   Do the following things EVERYDAY: Weigh yourself in the morning before breakfast. Write it down and keep it in a log. Take your medicines as prescribed Eat low salt foods--Limit salt (sodium) to 2000 mg per day.  Stay as active as you can everyday Limit all fluids for the day to less than 2 liters   At the [facility], you and your health needs are our priority. As part of our continuing mission to provide you with exceptional heart care, we have created designated Provider Care Teams. These Care Teams include your primary Cardiologist (physician) and Advanced Practice Providers (APPs- Physician Assistants and Nurse Practitioners) who all work together to provide you with the care you need, when you need it.   You may see any of the following providers on your designated Care Team at your next follow up: Dr Glori Bickers Dr Haynes Kerns, NP Lyda Jester, [place] Audry Riles, PharmD   Please be sure to bring in all your medications bottles to every appointment.

## 2021-01-08 NOTE — Procedures (Signed)
[facility]

## 2021-01-08 NOTE — Addendum Note (Signed)
Encounter addended by: Jolaine Artist, MD on: 01/08/2021 9:28 PM  Actions taken: Clinical Note Signed

## 2021-01-08 NOTE — Addendum Note (Signed)
Encounter addended by: Jolaine Artist, MD on: 01/08/2021 9:31 PM  Actions taken: Charge Capture section accepted

## 2021-01-18 ENCOUNTER — Encounter: Payer: Self-pay | Admitting: Hematology & Oncology

## 2021-01-20 ENCOUNTER — Ambulatory Visit (HOSPITAL_COMMUNITY)
Admission: RE | Admit: 2021-01-20 | Discharge: 2021-01-20 | Disposition: A | Discharge status: Home / Self Care | Payer: BC Managed Care – PPO | Source: Ambulatory Visit | Attending: Hematology & Oncology | Admitting: Hematology & Oncology

## 2021-01-20 ENCOUNTER — Other Ambulatory Visit: Payer: Self-pay

## 2021-01-20 DIAGNOSIS — Z170 Estrogen receptor positive status [ER+]: Secondary | ICD-10-CM | POA: Diagnosis present

## 2021-01-20 DIAGNOSIS — M818 Other osteoporosis without current pathological fracture: Secondary | ICD-10-CM | POA: Diagnosis present

## 2021-01-20 DIAGNOSIS — C50411 Malignant neoplasm of upper-outer quadrant of right female breast: Secondary | ICD-10-CM | POA: Diagnosis not present

## 2021-01-20 IMAGING — MR MR LUMBAR SPINE WO/W CM
5 of 7 series · 31 of 48 positions shown · IV contrast (gadavist)
Comparison: None.

CLINICAL DATA: Low back pain.  History of breast cancer

EXAM:
MRI LUMBAR SPINE WITHOUT AND WITH CONTRAST
TECHNIQUE: Multiplanar and multiecho pulse sequences of the lumbar spine were
obtained without and with intravenous contrast.
CONTRAST:  7mL GADAVIST GADOBUTROL 1 MMOL/ML IV SOLN

[Series 5: T1 · sagittal · 4.0mm · 0.81mm/px · 3 of 17 slices shown (1 of 2)]
[im 1/17]
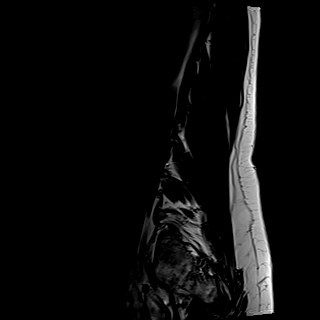
[im 9/17]
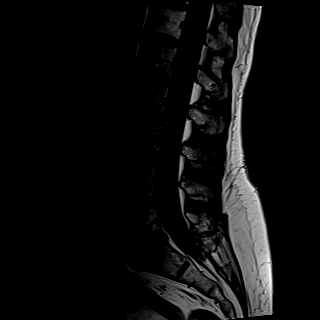
[im 17/17]
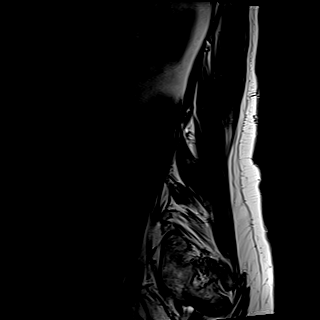

[Series 7: T2 · axial · 4.0mm · 0.62mm/px · z∈[-99,+120]mm · 11 of 44 slices shown]
[im 1/44]
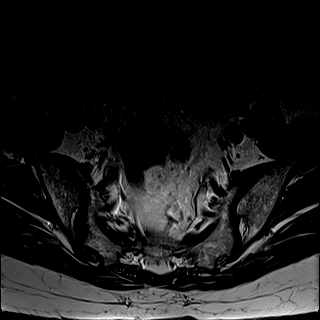
[im 5/44]
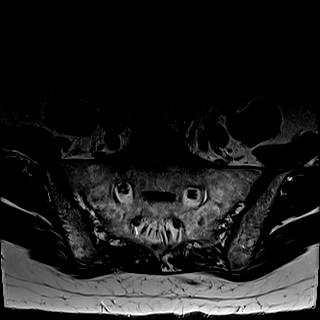
[im 9/44]
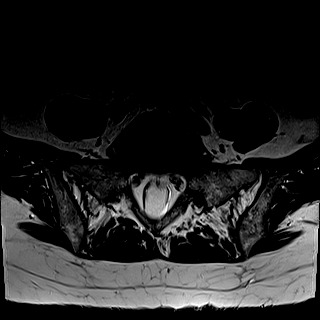
[im 13/44]
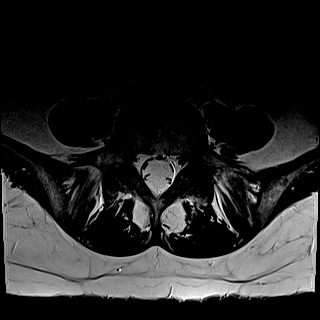
[im 18/44]
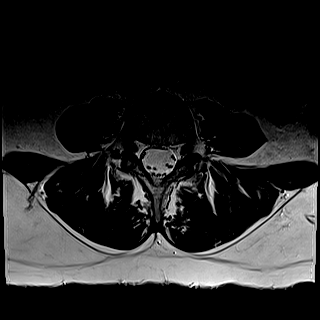
[im 22/44]
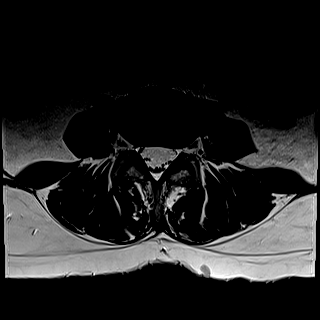
[im 26/44]
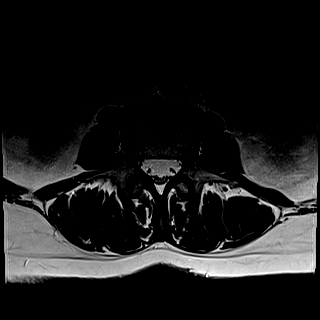
[im 31/44]
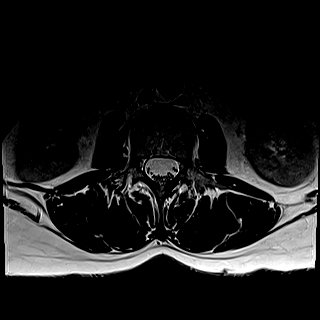
[im 35/44]
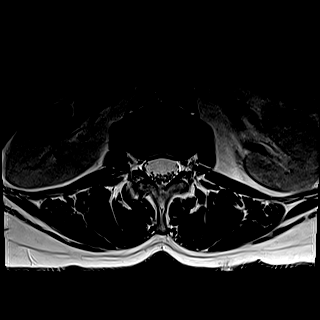
[im 39/44]
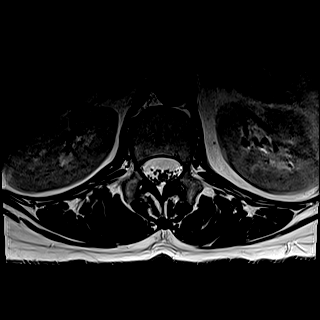
[im 44/44]
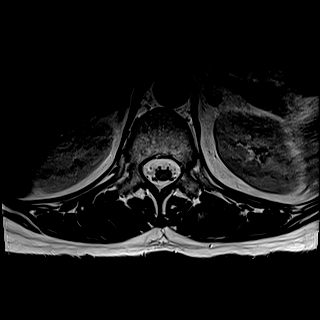

[Series 8: T1 · axial · 4.0mm · 0.39mm/px · z∈[-99,+120]mm · 11 of 44 slices shown (2 of 2)]
[im 1/44]
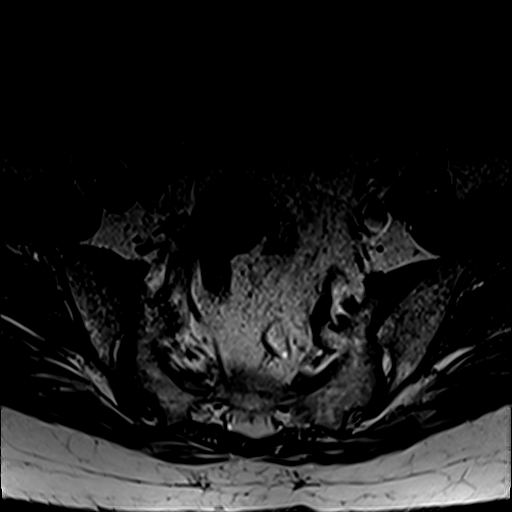
[im 5/44]
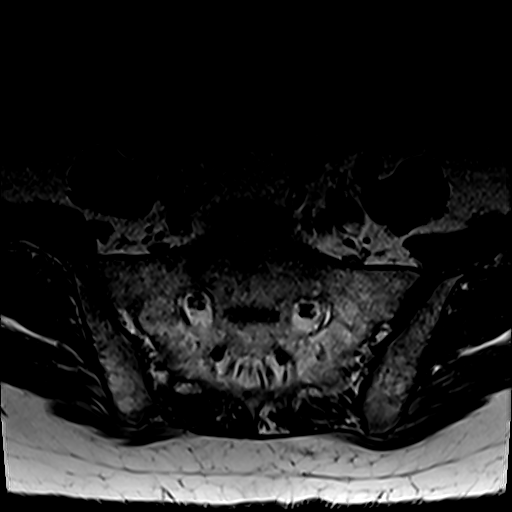
[im 9/44]
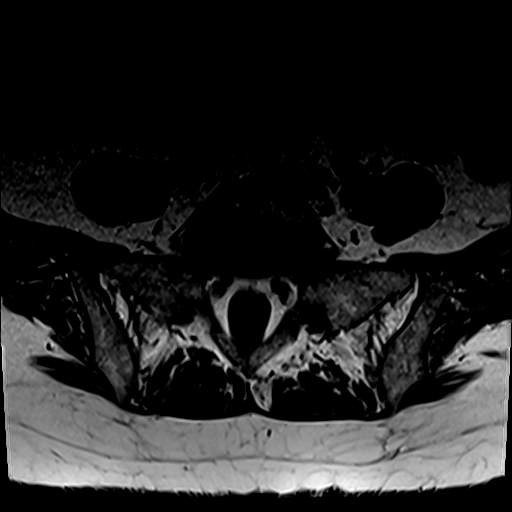
[im 13/44]
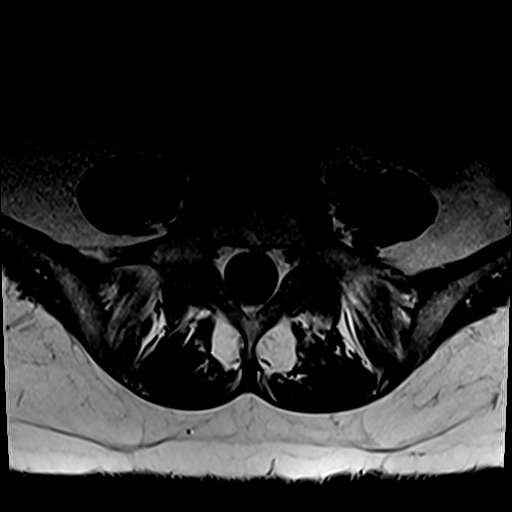
[im 18/44]
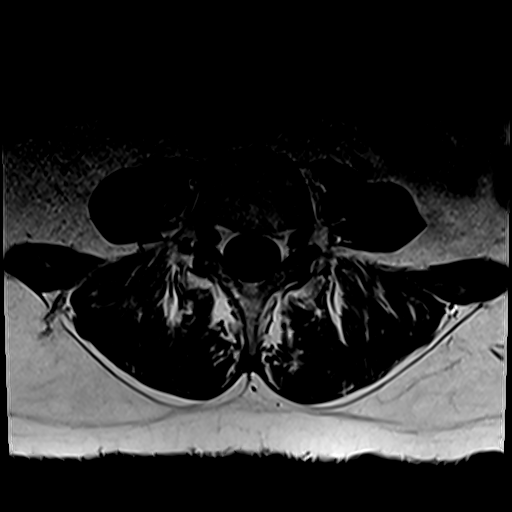
[im 22/44]
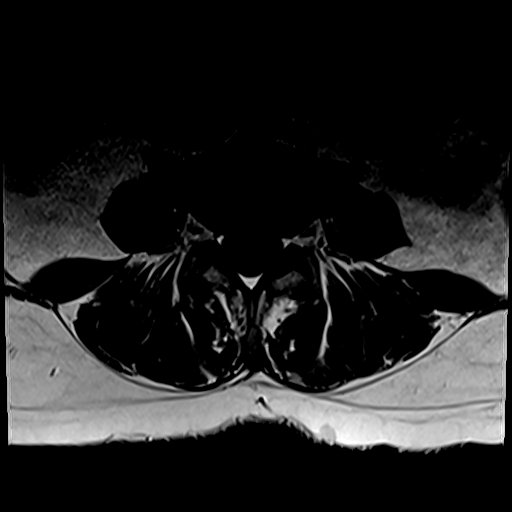
[im 26/44]
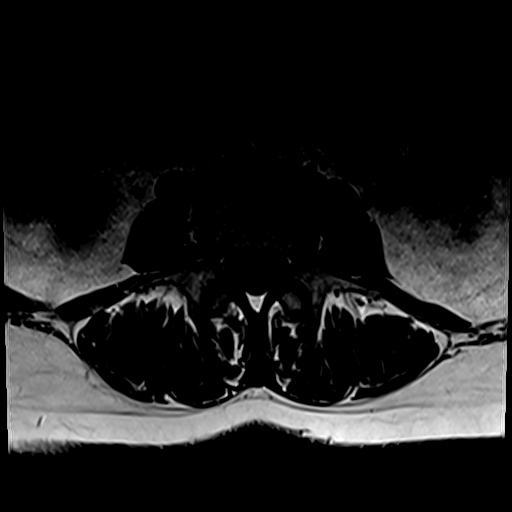
[im 31/44]
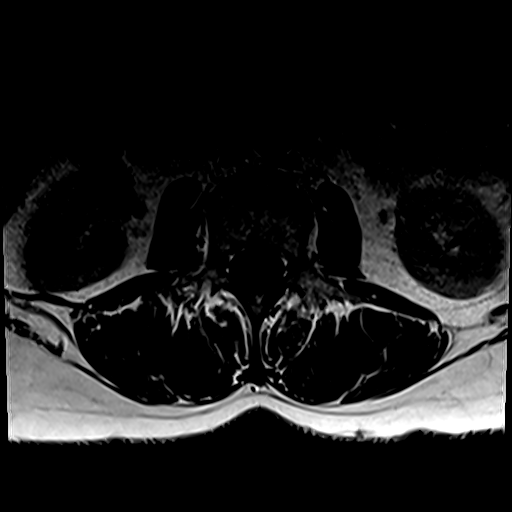
[im 35/44]
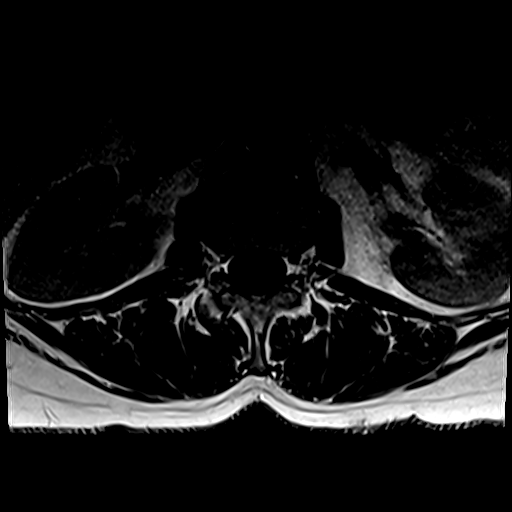
[im 39/44]
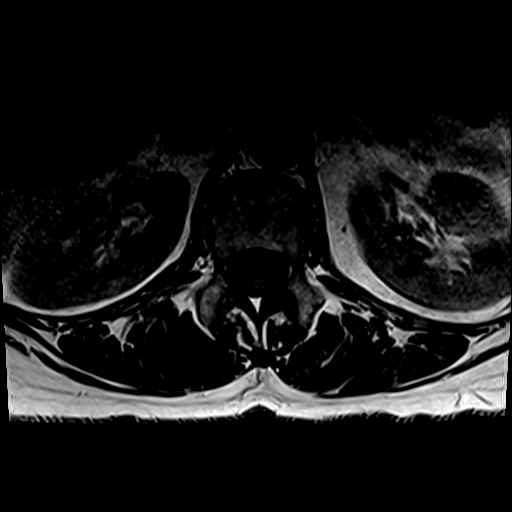
[im 44/44]
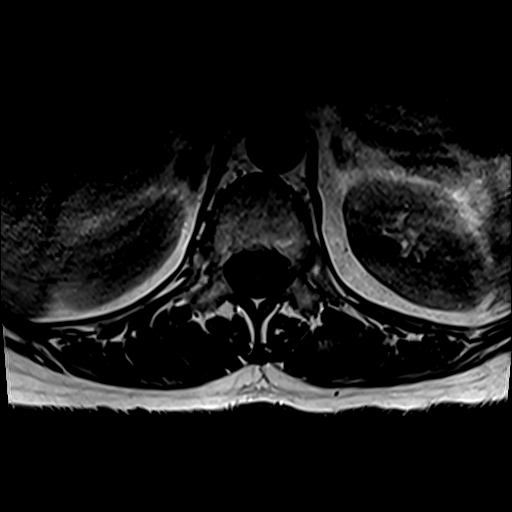

[Series 9: T2 post-contrast · sagittal · 4.0mm · 0.81mm/px · 4 of 17 slices shown]
[im 1/17]
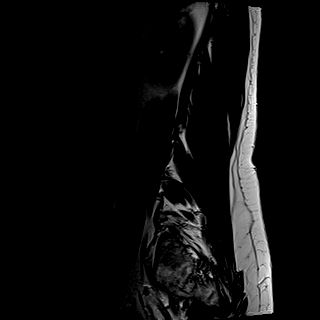
[im 6/17]
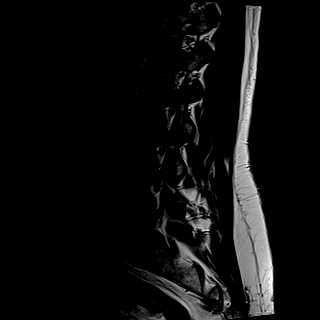
[im 11/17]
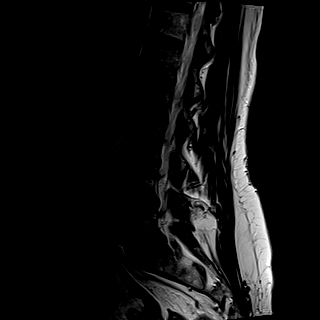
[im 17/17]
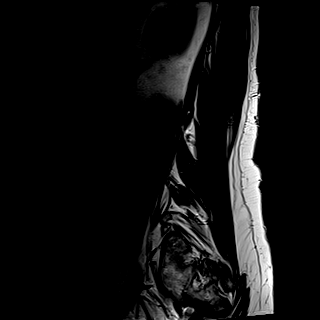

[Series 10: T1 fat-sat post-contrast · sagittal · 4.0mm · 0.81mm/px · 2 of 17 slices shown]
[im 1/17]
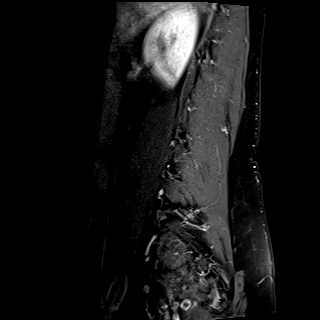
[im 6/17]
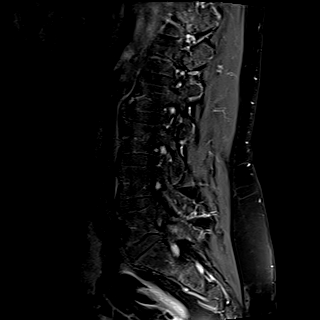

[31 of 48 positions shown; findings below may reference images not displayed]

FINDINGS: Segmentation:  Standard.

Alignment:  Physiologic.

Vertebrae: No fracture, evidence of discitis, or marrow replacing
bone lesion.

Conus medullaris and cauda equina: Conus extends to the L1 level.
Conus and cauda equina appear normal.

Paraspinal and other soft tissues: Negative.

Disc levels:

T12-L1: Unremarkable.

L1-L2: Unremarkable.

L2-L3: No disc protrusion. Minimal bilateral facet hypertrophy. No
foraminal or canal stenosis.

L3-L4: Minimal annular disc bulge. Mild bilateral facet hypertrophy.
No foraminal or canal stenosis.

L4-L5: Minimal annular disc bulge. Annular fissure in the left
lateral zone. Mild bilateral facet arthropathy. No foraminal or
canal stenosis.

L5-S1: Minimal disc bulge. Mild bilateral facet arthropathy. No
foraminal or canal stenosis.
IMPRESSION: 1. Mild degenerative changes of the lumbar spine without foraminal
or canal stenosis at any level.
2. No suspicious bone lesion.

## 2021-01-20 MED ORDER — GADOBUTROL 1 MMOL/ML IV SOLN
7.0000 mL | Freq: Once | INTRAVENOUS | Status: AC | PRN
  Administered 2021-01-20: 7 mL via INTRAVENOUS

## 2021-01-24 ENCOUNTER — Encounter: Payer: Self-pay | Admitting: *Deleted

## 2021-01-26 ENCOUNTER — Inpatient Hospital Stay: Payer: BC Managed Care – PPO | Attending: Hematology

## 2021-01-26 ENCOUNTER — Other Ambulatory Visit: Payer: Self-pay

## 2021-01-26 ENCOUNTER — Inpatient Hospital Stay (HOSPITAL_BASED_OUTPATIENT_CLINIC_OR_DEPARTMENT_OTHER): Payer: BC Managed Care – PPO | Admitting: Hematology & Oncology

## 2021-01-26 ENCOUNTER — Encounter: Payer: Self-pay | Admitting: Hematology & Oncology

## 2021-01-26 VITALS — BP 111/77 | HR 86 | Temp 98.3°F | Resp 18 | Ht 67.0 in | Wt 172.8 lb

## 2021-01-26 DIAGNOSIS — M818 Other osteoporosis without current pathological fracture: Secondary | ICD-10-CM | POA: Insufficient documentation

## 2021-01-26 DIAGNOSIS — C50411 Malignant neoplasm of upper-outer quadrant of right female breast: Secondary | ICD-10-CM | POA: Diagnosis present

## 2021-01-26 DIAGNOSIS — Z170 Estrogen receptor positive status [ER+]: Secondary | ICD-10-CM | POA: Insufficient documentation

## 2021-01-26 DIAGNOSIS — E559 Vitamin D deficiency, unspecified: Secondary | ICD-10-CM

## 2021-01-26 LAB — CMP (CANCER CENTER ONLY)
ALT: 21 U/L (ref 0–44)
AST: 22 U/L (ref 15–41)
Albumin: 4.4 g/dL (ref 3.5–5.0)
Alkaline Phosphatase: 85 U/L (ref 38–126)
Anion gap: 7 (ref 5–15)
BUN: 10 mg/dL (ref 6–20)
CO2: 28 mmol/L (ref 22–32)
Calcium: 11.2 mg/dL — ABNORMAL HIGH (ref 8.9–10.3)
Chloride: 102 mmol/L (ref 98–111)
Creatinine: 0.75 mg/dL (ref 0.44–1.00)
GFR, Estimated: >60 mL/min (ref >60)
Glucose, Bld: 103 mg/dL — ABNORMAL HIGH (ref 70–99)
Potassium: 4.2 mmol/L (ref 3.5–5.1)
Sodium: 137 mmol/L (ref 135–145)
Total Bilirubin: 0.4 mg/dL (ref 0.3–1.2)
Total Protein: 7.5 g/dL (ref 6.5–8.1)

## 2021-01-26 LAB — CBC WITH DIFFERENTIAL (CANCER CENTER ONLY)
Abs Immature Granulocytes: 0.02 10*3/uL (ref 0.00–0.07)
Basophils Absolute: 0.1 10*3/uL (ref 0.0–0.1)
Basophils Relative: 1 %
Eosinophils Absolute: 0.1 10*3/uL (ref 0.0–0.5)
Eosinophils Relative: 1 %
HCT: 43.3 % (ref 36.0–46.0)
Hemoglobin: 14.6 g/dL (ref 12.0–15.0)
Immature Granulocytes: 0 %
Lymphocytes Relative: 30 %
Lymphs Abs: 2.2 10*3/uL (ref 0.7–4.0)
MCH: 28.6 pg (ref 26.0–34.0)
MCHC: 33.7 g/dL (ref 30.0–36.0)
MCV: 84.9 fL (ref 80.0–100.0)
Monocytes Absolute: 0.6 10*3/uL (ref 0.1–1.0)
Monocytes Relative: 8 %
Neutro Abs: 4.4 10*3/uL (ref 1.7–7.7)
Neutrophils Relative %: 60 %
Platelet Count: 290 10*3/uL (ref 150–400)
RBC: 5.1 MIL/uL (ref 3.87–5.11)
RDW: 13.5 % (ref 11.5–15.5)
WBC Count: 7.2 10*3/uL (ref 4.0–10.5)
nRBC: 0 % (ref 0.0–0.2)

## 2021-01-26 LAB — VITAMIN D 25 HYDROXY (VIT D DEFICIENCY, FRACTURES): Vit D, 25-Hydroxy: 44.35 ng/mL (ref 30–100)

## 2021-01-26 NOTE — Progress Notes (Signed)
Hematology and Oncology Follow Up Visit  Stephanie Chase [id number] Aug 06, 1960 60 y.o. 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 6 months- next dose in 01/2021     Interim History:  Stephanie Chase is back for follow-up.  Unfortunately, she still have more problems with pain.  7 problems with her right arm.  We prescribed her some Lyrica in the past.  I do think she is really taking this.  We are going to see about getting her to neurology.  Maybe, they can evaluate her and do nerve conduction studies to see what might be going on.  Again I suspect this is from surgery that she had maybe have scar tissue.  She is still complaining of pain otherwise.  She has fatigue.  She is having harder time going up to see her mom he has cancer up in [place].  We had an MRI done of her back after we saw her last time.  This was relatively unremarkable.  She may have had a little bit of degenerative changes but nothing that looks like there is any intervention that could be helpful.  She had knee x-rays that were done.  Again there is nothing that looked unusual that needed orthopedic intervention.  We will have to maybe see about a rheumatologic consultation.  I does have a hard time believing that all these issues are from chemotherapy that she had.  Is been about a year since she had chemotherapy.  She had Taxol.  I suppose this could certainly still cause a lot of issues for her.  Her appetite is okay.  She is still smoking.  She is trying to watch how much she smokes.  She is not having any problems with fever.  There is no cough.  There is no change in bowel or bladder  habits.  She is not able to work because of the pain.  She cannot focus.  She has fatigue.  She does has a poor quality of life which I hate.  I just want to try to do but we can to get her feeling better.  Currently, I would say her performance status is probably ECOG 2.    Medications:  Current Outpatient Medications:    acetaminophen (TYLENOL) 500 MG tablet, Take 500-1,000 mg by mouth every 6 (six) hours as needed for moderate pain. , Disp: , Rfl:    albuterol (VENTOLIN HFA) 108 (90 Base) MCG/ACT inhaler, Inhale 2 puffs into the lungs every 6 (six) hours as needed for wheezing or shortness of breath., Disp: 8 g, Rfl: 2   ALPRAZolam (XANAX) 0.25 MG tablet, Take 1 tablet (0.25 mg total) by mouth daily as needed for anxiety., Disp: 30 tablet, Rfl: 0   baclofen (LIORESAL) 10 MG tablet, TAKE 1 TABLET BY MOUTH TWICE A DAY AS NEEDED FOR MUSCLE SPASM, Disp: 30 tablet, Rfl: 3   ergocalciferol (VITAMIN D2) 1.25 MG (50000 UT) capsule, Take 1 capsule (50,000 Units total) by mouth once a week., Disp: 30 capsule, Rfl: 1   fluticasone (FLONASE) 50 MCG/ACT nasal spray, Place 2 sprays into both nostrils in the morning and at bedtime., Disp: 16 g, Rfl: 5   fluticasone (FLOVENT HFA) 110 MCG/ACT inhaler, Inhale 2 puffs  into the lungs in the morning and at bedtime., Disp: 1 each, Rfl: 12   levothyroxine (SYNTHROID) 75 MCG tablet, Take 75 mcg by mouth daily before breakfast., Disp: , Rfl:    liothyronine (CYTOMEL) 5 MCG tablet, TAKE 2 TABLETS (=10MCG     TOTAL)     DAILY, Disp: 180 tablet, Rfl: 1   ondansetron (ZOFRAN) 8 MG tablet, Take 1 tablet (8 mg total) by mouth every 8 (eight) hours as needed. (Patient not taking: Reported on 01/26/2021), Disp: 30 tablet, Rfl: 2   pregabalin (LYRICA) 75 MG capsule, Take 1 capsule (75 mg total) by mouth 2 (two) times daily. (Patient not taking: Reported on 01/26/2021), Disp: 60 capsule, Rfl: 3   rosuvastatin (CRESTOR) 10 MG tablet, Take 1 tablet (10 mg total) by mouth daily.  (Patient not taking: Reported on 01/26/2021), Disp: 90 tablet, Rfl: 3 No current facility-administered medications for this visit.  Facility-Administered Medications Ordered in Other Visits:    alum & mag hydroxide-simeth (MAALOX/MYLANTA) 200-200-20 MG/5ML suspension 30 mL, 30 mL, Oral, Once, Tanner, Lyndon Code., PA-C  Allergies:  Allergies  Allergen Reactions   Bactrim [Sulfamethoxazole-Trimethoprim] Itching and Rash    Past Medical History, Surgical history, Social history, and Family History were reviewed and updated.  Review of Systems: Review of Systems  Constitutional:  Positive for fatigue.  HENT:   Positive for hearing loss and tinnitus.   Eyes:  Positive for eye problems.  Respiratory:  Positive for cough.   Cardiovascular: Negative.   Gastrointestinal:  Positive for abdominal pain and nausea.  Endocrine: Negative.   Genitourinary: Negative.    Musculoskeletal:  Positive for arthralgias and myalgias.  Skin: Negative.   Neurological:  Positive for light-headedness.  Hematological: Negative.   Psychiatric/Behavioral: Negative.     Physical Exam:  height is '5\' 7"'  (1.702 m) and weight is 172 lb 12.8 oz (78.4 kg). Her oral temperature is 98.3 F (36.8 C). Her blood pressure is 111/77 and her pulse is 86. Her respiration is 18 and oxygen saturation is 100%.   Wt Readings from Last 3 Encounters:  01/26/21 172 lb 12.8 oz (78.4 kg)  12/16/20 173 lb 9.6 oz (78.7 kg)  12/10/20 172 lb 6.4 oz (78.2 kg)    Physical Exam Vitals reviewed.  Constitutional:      Comments: She has had bilateral mastectomies.  Mastectomy scars are well-healed.  There might be some nodularity along the mastectomy site.  Again I have to believe this is scar tissue.  There is no erythema or warmth or swelling associated with these.  I cannot palpate any bilateral axillary lymph nodes.  HENT:     Head: Normocephalic and atraumatic.  Eyes:     Pupils: Pupils are equal, round, and reactive to light.   Cardiovascular:     Rate and Rhythm: Normal rate and regular rhythm.     Heart sounds: Normal heart sounds.  Pulmonary:     Effort: Pulmonary effort is normal.     Breath sounds: Normal breath sounds.  Abdominal:     General: Bowel sounds are normal.     Palpations: Abdomen is soft.  Musculoskeletal:        General: No tenderness or deformity. Normal range of motion.     Cervical back: Normal range of motion.  Lymphadenopathy:     Cervical: No cervical adenopathy.  Skin:    General: Skin is warm and dry.     Findings: No erythema or rash.  Neurological:     Mental  Status: She is alert and oriented to person, place, and time.  Psychiatric:        Behavior: Behavior normal.        Thought Content: Thought content normal.        Judgment: Judgment normal.   Lab Results  Component Value Date   WBC 7.2 01/26/2021   HGB 14.6 01/26/2021   HCT 43.3 01/26/2021   MCV 84.9 01/26/2021   PLT 290 01/26/2021     Chemistry      Component Value Date/Time   NA 136 12/10/2020 0925   K 3.8 12/10/2020 0925   CL 102 12/10/2020 0925   CO2 24 12/10/2020 0925   BUN 10 12/10/2020 0925   CREATININE 0.71 12/10/2020 0925   CREATININE 0.66 09/12/2017 0747      Component Value Date/Time   CALCIUM 10.2 12/10/2020 0925   ALKPHOS 64 12/10/2020 0925   AST 19 12/10/2020 0925   ALT 15 12/10/2020 0925   BILITOT 0.4 12/10/2020 0925      Impression and Plan: Ms. Schoof is is a very nice 60 year old postmenopausal female.  She is originally from [place].  She moved down to [facility].  She has 2 grandchildren already.  Her children live on the [place].  She has early stage breast cancer.  Unfortunately, it is HER-2 positive.  Because this, she required chemotherapy with Herceptin.  Because of the chemotherapy, she has had side effects.  So far, these side effects have not let up.  I am not sure if she will ever have a significant improvement in her quality of life.  We will make  sure that we are not overlooking anything else.  We will see if we can get her to consultants and specialists.  We will see about neurology.  We will see about rheumatology.  She wants I think her back to see lymphedema clinic.  I do not see a lot of lymphedema in the right arm.  Maybe, if she can start the Lyrica that we will help.  We still are not really able to get her on an aromatase inhibitor.  I just worried that she is going to have issues and that she will have more problems and have a poor quality of life.  We will plan to get her back after Thanksgiving.  I just feel bad.  I know she is trying her best.  I know she wants to be able to go up to [place] to help her mom.  This is becoming more of a challenge for her.   [name], MD 11/2/202210:03 AM

## 2021-01-27 LAB — CANCER ANTIGEN 27.29: CA 27.29: 13.8 U/mL (ref 0.0–38.6)

## 2021-01-28 ENCOUNTER — Telehealth: Payer: Self-pay | Admitting: *Deleted

## 2021-01-28 ENCOUNTER — Telehealth: Payer: Self-pay

## 2021-01-28 NOTE — Telephone Encounter (Signed)
Patient called asking about the referral Dr.Ennever had discussed with her about seeing either neuro or rheumotology and wanted to know who she might be seeing so she can call them to get the appt. Called patient back and informed her that Dr.Ennever was still working on her note and referral but once it was complete they should contact her to get that appt scheduled. Pt verbalized understanding and denies any other questions or concerns at this time

## 2021-01-28 NOTE — Telephone Encounter (Signed)
No 01/26/21 LOS

## 2021-01-30 ENCOUNTER — Encounter: Payer: Self-pay | Admitting: Hematology & Oncology

## 2021-01-30 ENCOUNTER — Encounter: Payer: Self-pay | Admitting: Hematology

## 2021-01-31 ENCOUNTER — Telehealth: Payer: Self-pay | Admitting: *Deleted

## 2021-01-31 NOTE — Telephone Encounter (Signed)
Per 01/26/21 los - called and gave upcoming appointments - confirmed 

## 2021-02-02 ENCOUNTER — Other Ambulatory Visit: Payer: Self-pay

## 2021-02-02 ENCOUNTER — Encounter: Payer: Self-pay | Admitting: Rehabilitation

## 2021-02-02 ENCOUNTER — Ambulatory Visit: Payer: BC Managed Care – PPO | Attending: Hematology & Oncology | Admitting: Rehabilitation

## 2021-02-02 DIAGNOSIS — Z483 Aftercare following surgery for neoplasm: Secondary | ICD-10-CM | POA: Diagnosis present

## 2021-02-02 DIAGNOSIS — R293 Abnormal posture: Secondary | ICD-10-CM | POA: Diagnosis present

## 2021-02-02 DIAGNOSIS — M6281 Muscle weakness (generalized): Secondary | ICD-10-CM | POA: Diagnosis present

## 2021-02-02 DIAGNOSIS — M25611 Stiffness of right shoulder, not elsewhere classified: Secondary | ICD-10-CM | POA: Insufficient documentation

## 2021-02-02 DIAGNOSIS — M25511 Pain in right shoulder: Secondary | ICD-10-CM | POA: Insufficient documentation

## 2021-02-02 DIAGNOSIS — C50411 Malignant neoplasm of upper-outer quadrant of right female breast: Secondary | ICD-10-CM | POA: Diagnosis not present

## 2021-02-02 NOTE — Therapy (Signed)
[facility] @ [address] [place] [place], [place], [address] Phone: [phone]   Fax:  [phone]  Physical Therapy Treatment  Patient Details  Name: Stephanie Chase MRN: [id number] Date of Birth: Aug 02, 1960 Referring Provider (PT): Dr. Marin Olp   Encounter Date: 02/02/2021   PT End of Session - 02/02/21 1454     Visit Number 10    Number of Visits 18    Date for PT Re-Evaluation 03/30/21    PT Start Time 1400    PT Stop Time 1450    PT Time Calculation (min) 50 min    Activity Tolerance Patient tolerated treatment well    Behavior During Therapy [facility] for tasks assessed/performed             Past Medical History:  Diagnosis Date   Anemia    Anxiety    Asthma    Breast cancer ([place])    right, 05/13/20 completed Taxol, on Herceptin   Cervical paraspinous muscle spasm    COPD (chronic obstructive pulmonary disease) ([facility])    emphysema   Discoid lupus    Discoid lupus    Family history of bladder cancer    Family history of BRCA gene mutation    Family history of breast cancer    Family history of prostate cancer    GERD (gastroesophageal reflux disease)    Hypercholesteremia    Hypothyroidism    Neuropathy    Pneumonia    Thyroid disease     Past Surgical History:  Procedure Laterality Date   ABLATION ON ENDOMETRIOSIS  1829   APPLICATION OF A-CELL OF EXTREMITY Right 10/22/2019   Procedure: EXCISION OF RIGHT BREAST WOUND WITH PRIMARY CLOSURE;  Surgeon: [name] Going, DO;  Location: [facility];  Service: Plastics;  Laterality: Right;   BREAST LUMPECTOMY     DEBRIDEMENT AND CLOSURE WOUND Right 10/22/2019   Procedure: Excision of right breast wound;  Surgeon: [name] Going, DO;  Location: [facility];  Service: Plastics;  Laterality: Right;   MASTECTOMY W/ SENTINEL NODE BIOPSY Bilateral 09/05/2019   Procedure: BILATERAL MASTECTOMY WITH RIGHT SENTINEL LYMPH NODE BIOPSY;  Surgeon: Stark Klein, MD;  Location: [facility];   Service: General;  Laterality: Bilateral;  COMBINED WITH REGIONAL FOR POST OP PAIN   PORTACATH PLACEMENT Left 09/05/2019   Procedure: INSERTION PORT-A-CATH WITH ULTRASOUND GUIDANCE;  Surgeon: Stark Klein, MD;  Location: [facility];  Service: General;  Laterality: Left;   TONSILLECTOMY     TUBAL LIGATION     WISDOM TOOTH EXTRACTION      There were no vitals filed for this visit.   Subjective Assessment - 02/02/21 1357     Subjective My arm is getting worse.  It is hurting when I reach and use it.   I haven't been doing anything you have given me because I'm nervous to do it.  Sometimes just walking can make it worse.  It feels like in the armpit it may be swollen.    Pertinent History Bil masectomy 09/05/19 with 3 lymph nodes removed and negative on the R and benign tisuse on the L. Pt had to ago for another debridement on August 28 by Dr. Marla Roe due to delayed healing and necrosis. Pt did not have radiation  Patient was diagnosed on 06/09/2019 with right grade III invasive ductal carcinoma breast cancer. It mesures 1.3 cm in the upper outer quadrant with 1.6 cm of calcifications and DCIS. It is ER positive, PR negative, and HER2  positive with a Ki67 of 50%    Currently in Pain? Yes    Pain Score 3     Pain Location Arm    Pain Orientation Right    Pain Descriptors / Indicators Aching;Sore    Pain Type Chronic pain    Pain Onset More than a month ago    Pain Frequency Constant    Aggravating Factors  movement like reaching    Pain Relieving Factors rest                [facility] PT Assessment - 02/02/21 0001       Assessment   Medical Diagnosis Right breast cancer    Referring Provider (PT) Dr. Marin Olp    Onset Date/Surgical Date 09/05/19    Hand Dominance Right    Prior Therapy yes here 2x without improvement      Precautions   Precaution Comments breast cancer, bil masectomy       Balance Screen   Has the patient fallen in the past 6 months No      [place] residence    Living Arrangements Spouse/significant other    Available Help at Discharge Family      Prior Function   Level of [place] with basic ADLs    Vocation On disability    Leisure not able to do much currently      Cognition   Overall Cognitive Status Within Functional Limits for tasks assessed      Observation/Other Assessments   Observations Pt with well healed bilateral mastectomy scars with slight fullness at bil axilla mostly skin Diffuse muscle atropy      Sensation   Additional Comments nerve like intermittent pain Rt UE, axilla and posterior arm      Posture/Postural Control   Posture/Postural Control Postural limitations    Postural Limitations Forward head;Rounded Shoulders;Decreased thoracic kyphosis      AROM   AROM Assessment Site Shoulder   all movements with increased pain   Right Shoulder Flexion 130 Degrees    Right Shoulder ABduction 105 Degrees    Right Shoulder External Rotation --      Strength   Strength Assessment Site Shoulder    Right/Left Shoulder Right;Left    Right Shoulder Flexion 4/5   pain   Right Shoulder Extension 4/5    Right Shoulder ABduction 4-/5    Right Shoulder Internal Rotation 4/5    Right Shoulder External Rotation 4-/5   pain   Left Shoulder Flexion 4/5    Left Shoulder Extension 4/5    Left Shoulder ABduction 4/5    Left Shoulder Internal Rotation 4/5    Left Shoulder External Rotation 4/5      Palpation   Palpation comment no RTC tenderness at insertions, no sig tenderness musculature in general but some tightness and +1 ttp posterior deltoid and posterior axilla region                L-DEX FLOWSHEETS - 02/02/21 1400       L-DEX LYMPHEDEMA SCREENING   Measurement Type Unilateral    L-DEX MEASUREMENT EXTREMITY Upper Extremity    POSITION  Standing    DOMINANT SIDE Right    At Risk Side Right    BASELINE SCORE (UNILATERAL) -2.5    L-DEX SCORE (UNILATERAL) -2.9     VALUE CHANGE (UNILAT) -0.4  PT Education - 02/02/21 1453     Education Details post mastectomy pain syndrome, pain mapping, hypersensitivity, posture, and POC    Person(s) Educated Patient    Methods Explanation;Handout    Comprehension Verbalized understanding                 PT Long Term Goals - 02/02/21 1459       PT LONG TERM GOAL #1   Title Pt will improve Rt shoulder flexion to 150 to return to prior motion    Baseline 130 on 02/02/21    Time 8    Period Weeks    Status New      PT LONG TERM GOAL #2   Title Pt will improve Rt shoulder abduction to at least 150 to improve AROM    Baseline 105 on 02/02/21    Time 8    Period Weeks    Status New      PT LONG TERM GOAL #3   Title Pt will report no resting pain in the arm    Time 8    Period Weeks    Status New      PT LONG TERM GOAL #4   Title Pt will improve Rt shoulder strength to at least 4/5 without increased pain    Time 8    Period Weeks    Status New                   Plan - 02/02/21 1455     Clinical Impression Statement Pt returns to PT after 5 months with continued Rt UE nerve like pain that increases with movements as small as walking to reaching behind her.  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  Discussed brain pain mapping and trying STM/MLD and desensitization as well as postural strengthening and slow acceptance of movements to try and decrease pain response and impingement.  Pt agreeable to trying PT again despite no sig change the first 2 sessions.Still no signs of lymphedema with LDEX scans. AROM has decreased around 30deg since last visits here.    Comorbidities bil masectomy with 3 lymph node removal on the R, chemotherapy, ongoing infusions    PT Frequency 2x / week    PT Duration 4 weeks    PT Treatment/Interventions Therapeutic activities;Therapeutic  exercise;Neuromuscular re-education;Manual techniques;Cryotherapy;Moist Heat;ADLs/Self Care Home Management;Manual lymph drainage;Passive range of motion;Dry needling;Taping;Patient/family education;Functional mobility training    PT Next Visit Plan gentle Rt UE PROM/AROM working slightly into pain to improve this slowly with acceptance of pain and desensitization, postural strengthening starting with isometrics    Consulted and Agree with Plan of Care Patient             Patient will benefit from skilled therapeutic intervention in order to improve the following deficits and impairments:  Decreased knowledge of precautions, Pain, Decreased range of motion, Increased edema  Visit Diagnosis: Abnormal posture  Right shoulder pain, unspecified chronicity  Muscle weakness (generalized)  Aftercare following surgery for neoplasm     Problem List Patient Active Problem List   Diagnosis Date Noted   Neck pain on left side 06/02/2020   Chronic left-sided low back pain with left-sided sciatica 06/02/2020   Paresthesia 05/13/2020   Malignant neoplasm of right female breast ([place]) 05/13/2020   Pulmonary emphysema ([place]) 04/15/2020   Current smoker 04/15/2020   Other allergy status, other than to drugs and biological substances 04/15/2020   Pure  hypercholesterolemia 04/15/2020   Skin sensation disturbance 04/15/2020   Spasm 04/15/2020   Osteoporosis 03/12/2020   Acquired absence of breast and absent nipple, bilateral 10/07/2019   Breast wound, right, initial encounter 10/07/2019   Breast cancer of upper-outer quadrant of right female breast ([facility]) 09/05/2019   Genetic testing 08/12/2019   Family history of BRCA gene mutation 07/31/2019   Family history of breast cancer    Family history of prostate cancer    Family history of bladder cancer    Malignant neoplasm of upper-outer quadrant of right breast in female, estrogen receptor positive ([facility]) 07/24/2019   Breast cancer ([facility])  07/18/2019   Annual physical exam 03/31/2016   Occupational bronchitis ([place]) 03/03/2015   Hyperlipidemia 03/30/2014   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

## 2021-02-04 ENCOUNTER — Ambulatory Visit: Payer: BC Managed Care – PPO

## 2021-02-04 ENCOUNTER — Other Ambulatory Visit: Payer: Self-pay

## 2021-02-04 DIAGNOSIS — R293 Abnormal posture: Secondary | ICD-10-CM

## 2021-02-04 DIAGNOSIS — M25511 Pain in right shoulder: Secondary | ICD-10-CM

## 2021-02-04 DIAGNOSIS — Z483 Aftercare following surgery for neoplasm: Secondary | ICD-10-CM

## 2021-02-04 DIAGNOSIS — M25611 Stiffness of right shoulder, not elsewhere classified: Secondary | ICD-10-CM

## 2021-02-04 DIAGNOSIS — M6281 Muscle weakness (generalized): Secondary | ICD-10-CM

## 2021-02-04 NOTE — Patient Instructions (Signed)
Access Code: [email or web address] URL: [email or web address] Date: 02/04/2021 Prepared by: Cheral Almas  Exercises Standing Isometric Shoulder Internal Rotation at Doorway - 1 x daily - 7 x weekly - 1 sets - 5 reps Isometric Shoulder Extension at Wall - 1 x daily - 7 x weekly - 1 sets - 5 reps Standing Isometric Shoulder External Rotation with Doorway - 1 x daily - 7 x weekly - 1 sets - 5 reps Seated Scapular Retraction - 1 x daily - 7 x weekly - 1 sets - 10 reps Supine Shoulder Circles - 1 x daily - 7 x weekly - 1 sets - 10 reps   Educated pt in transverse friction massage to proximal biceps 2 min several times per day And ice massage for 2 mins to decrease inflammation

## 2021-02-04 NOTE — Therapy (Signed)
[facility] @ [place], [place], [address] Phone: [phone]   Fax:  [phone]  Physical Therapy Treatment  Patient Details  Name: Stephanie Chase MRN: [id number] Date of Birth: Nov 24, 1960 Referring Provider (PT): Dr. Marin Olp   Encounter Date: 02/04/2021   PT End of Session - 02/04/21 1217     Visit Number 11    Number of Visits 18    Date for PT Re-Evaluation 03/30/21    PT Start Time 1100    PT Stop Time 1155    PT Time Calculation (min) 55 min    Activity Tolerance Patient tolerated treatment well    Behavior During Therapy [facility] for tasks assessed/performed             Past Medical History:  Diagnosis Date   Anemia    Anxiety    Asthma    Breast cancer ([place])    right, 05/13/20 completed Taxol, on Herceptin   Cervical paraspinous muscle spasm    COPD (chronic obstructive pulmonary disease) ([place])    emphysema   Discoid lupus    Discoid lupus    Family history of bladder cancer    Family history of BRCA gene mutation    Family history of breast cancer    Family history of prostate cancer    GERD (gastroesophageal reflux disease)    Hypercholesteremia    Hypothyroidism    Neuropathy    Pneumonia    Thyroid disease     Past Surgical History:  Procedure Laterality Date   ABLATION ON ENDOMETRIOSIS  5697   APPLICATION OF A-CELL OF EXTREMITY Right 10/22/2019   Procedure: EXCISION OF RIGHT BREAST WOUND WITH PRIMARY CLOSURE;  Surgeon: [name] Going, DO;  Location: [facility];  Service: Plastics;  Laterality: Right;   BREAST LUMPECTOMY     DEBRIDEMENT AND CLOSURE WOUND Right 10/22/2019   Procedure: Excision of right breast wound;  Surgeon: [name] Going, DO;  Location: [facility];  Service: Plastics;  Laterality: Right;   MASTECTOMY W/ SENTINEL NODE BIOPSY Bilateral 09/05/2019   Procedure: BILATERAL MASTECTOMY WITH RIGHT SENTINEL LYMPH NODE BIOPSY;  Surgeon: Stark Klein, MD;  Location: [facility];   Service: General;  Laterality: Bilateral;  COMBINED WITH REGIONAL FOR POST OP PAIN   PORTACATH PLACEMENT Left 09/05/2019   Procedure: INSERTION PORT-A-CATH WITH ULTRASOUND GUIDANCE;  Surgeon: Stark Klein, MD;  Location: [facility];  Service: General;  Laterality: Left;   TONSILLECTOMY     TUBAL LIGATION     WISDOM TOOTH EXTRACTION      There were no vitals filed for this visit.   Subjective Assessment - 02/04/21 1058     Subjective I tried some of the exercises. It seems like there is some pain in my body no matter what I do.  The arm/shoulder pain is getting severe.    Pertinent History Bil masectomy 09/05/19 with 3 lymph nodes removed and negative on the R and benign tisuse on the L. Pt had to ago for another debridement on August 28 by Dr. Marla Roe due to delayed healing and necrosis. Pt did not have radiation  Patient was diagnosed on 06/09/2019 with right grade III invasive ductal carcinoma breast cancer. It mesures 1.3 cm in the upper outer quadrant with 1.6 cm of calcifications and DCIS. It is ER positive, PR negative, and HER2 positive with a Ki67 of 50%    Currently in Pain? Yes    Pain Score 4  Pain Location Shoulder    Pain Orientation Right    Pain Descriptors / Indicators Aching;Nagging                               [facility] Adult PT Treatment/Exercise - 02/04/21 0001       Shoulder Exercises: Supine   Other Supine Exercises Resisted IR/ER 3 x 20 secs, supine alphabet A-G, H-M, AAROM with opposite hand x 8      Shoulder Exercises: Standing   Diagonals --   isometric ext, IR, ER x 3 ea   Theraband Level (Shoulder Diagonals) --   pendulums side to side, small circles     Manual Therapy   Scapular Mobilization D2 ext with PT resistance 2 x 10    Passive ROM Gentle PROM in all directions                     PT Education - 02/04/21 1205     Education Details isometric ext, IR, ER, scapular retraction/D2 ext, transverse friction massage, ice  massage    Person(s) Educated Patient    Methods Explanation;Handout    Comprehension Verbalized understanding;Need further instruction                 PT Long Term Goals - 02/02/21 1459       PT LONG TERM GOAL #1   Title Pt will improve Rt shoulder flexion to 150 to return to prior motion    Baseline 130 on 02/02/21    Time 8    Period Weeks    Status New      PT LONG TERM GOAL #2   Title Pt will improve Rt shoulder abduction to at least 150 to improve AROM    Baseline 105 on 02/02/21    Time 8    Period Weeks    Status New      PT LONG TERM GOAL #3   Title Pt will report no resting pain in the arm    Time 8    Period Weeks    Status New      PT LONG TERM GOAL #4   Title Pt will improve Rt shoulder strength to at least 4/5 without increased pain    Time 8    Period Weeks    Status New                   Plan - 02/04/21 1219     Clinical Impression Statement Pt was instructed in gentle exercises today to try to get her to not be afraid  to use her arm and to work within  ranges that were minimally painful.  We discussed weakness developing since her surgery, postural components of pain and possible inflammation of biceps tendon. Educated in D2 ext with PT resistance and pt did exceptionally well with this. Initiated supine and standing exs , and performed PROM in a very limited ROM to gain pt confidence. Pt was very tender over proximal biceps today with pain radiating into the biceps.    Comorbidities bil masectomy with 3 lymph node removal on the R, chemotherapy, ongoing infusions    Stability/Clinical Decision Making Stable/Uncomplicated    Rehab Potential Good    PT Frequency 2x / week    PT Duration 4 weeks    PT Treatment/Interventions Therapeutic activities;Therapeutic exercise;Neuromuscular re-education;Manual techniques;Cryotherapy;Moist Heat;ADLs/Self Care Home Management;Manual lymph drainage;Passive range of motion;Dry  needling;Taping;Patient/family education;Functional  mobility training    PT Next Visit Plan D2 ext Scap, review isometrics and add flexion,abd, continue supine stabs, PROM, progress to strength as tolerated. consider adding Ionto to plan if she continues with tenderness at prox biceps    Consulted and Agree with Plan of Care Patient             Patient will benefit from skilled therapeutic intervention in order to improve the following deficits and impairments:  Decreased knowledge of precautions, Pain, Decreased range of motion, Increased edema  Visit Diagnosis: Abnormal posture  Right shoulder pain, unspecified chronicity  Muscle weakness (generalized)  Aftercare following surgery for neoplasm  Stiffness of right shoulder, not elsewhere classified     Problem List Patient Active Problem List   Diagnosis Date Noted   Neck pain on left side 06/02/2020   Chronic left-sided low back pain with left-sided sciatica 06/02/2020   Paresthesia 05/13/2020   Malignant neoplasm of right female breast ([place]) 05/13/2020   Pulmonary emphysema ([place]) 04/15/2020   Current smoker 04/15/2020   Other allergy status, other than to drugs and biological substances 04/15/2020   Pure hypercholesterolemia 04/15/2020   Skin sensation disturbance 04/15/2020   Spasm 04/15/2020   Osteoporosis 03/12/2020   Acquired absence of breast and absent nipple, bilateral 10/07/2019   Breast wound, right, initial encounter 10/07/2019   Breast cancer of upper-outer quadrant of right female breast ([place]) 09/05/2019   Genetic testing 08/12/2019   Family history of BRCA gene mutation 07/31/2019   Family history of breast cancer    Family history of prostate cancer    Family history of bladder cancer    Malignant neoplasm of upper-outer quadrant of right breast in female, estrogen receptor positive ([place]) 07/24/2019   Breast cancer ([place]) 07/18/2019   Annual physical exam 03/31/2016   Occupational bronchitis ([place])  03/03/2015   Hyperlipidemia 03/30/2014   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

## 2021-02-08 ENCOUNTER — Encounter: Payer: BC Managed Care – PPO | Admitting: Physical Therapy

## 2021-02-21 ENCOUNTER — Ambulatory Visit: Payer: BC Managed Care – PPO

## 2021-02-21 ENCOUNTER — Other Ambulatory Visit: Payer: Self-pay | Admitting: Hematology & Oncology

## 2021-02-21 ENCOUNTER — Other Ambulatory Visit: Payer: Self-pay

## 2021-02-21 DIAGNOSIS — M25511 Pain in right shoulder: Secondary | ICD-10-CM

## 2021-02-21 DIAGNOSIS — Z483 Aftercare following surgery for neoplasm: Secondary | ICD-10-CM

## 2021-02-21 DIAGNOSIS — M6281 Muscle weakness (generalized): Secondary | ICD-10-CM

## 2021-02-21 DIAGNOSIS — R293 Abnormal posture: Secondary | ICD-10-CM | POA: Diagnosis not present

## 2021-02-21 DIAGNOSIS — C50411 Malignant neoplasm of upper-outer quadrant of right female breast: Secondary | ICD-10-CM

## 2021-02-21 NOTE — Telephone Encounter (Signed)
Refill requested for Xanax 0.25 mg #30. Last refilled 12/13/20 # 30. Last OV 01/26/21 next scheduled 03/14/21. Please advise for refills, thanks!

## 2021-02-21 NOTE — Therapy (Signed)
[facility] @ [place], [place], [address] Phone: [phone]   Fax:  [phone]  Physical Therapy Treatment  Patient Details  Name: Stephanie Chase MRN: [id number] Date of Birth: [birth year] Referring Provider (PT): Dr. Marin Olp   Encounter Date: 02/21/2021   PT End of Session - 02/21/21 1210     Visit Number 12    Number of Visits 18    Date for PT Re-Evaluation 03/30/21    PT Start Time 9892    PT Stop Time 1100    PT Time Calculation (min) 45 min    Activity Tolerance Patient tolerated treatment well;Patient limited by pain    Behavior During Therapy [facility] for tasks assessed/performed             Past Medical History:  Diagnosis Date   Anemia    Anxiety    Asthma    Breast cancer ([place])    right, 05/13/20 completed Taxol, on Herceptin   Cervical paraspinous muscle spasm    COPD (chronic obstructive pulmonary disease) ([place])    emphysema   Discoid lupus    Discoid lupus    Family history of bladder cancer    Family history of BRCA gene mutation    Family history of breast cancer    Family history of prostate cancer    GERD (gastroesophageal reflux disease)    Hypercholesteremia    Hypothyroidism    Neuropathy    Pneumonia    Thyroid disease     Past Surgical History:  Procedure Laterality Date   ABLATION ON ENDOMETRIOSIS  1194   APPLICATION OF A-CELL OF EXTREMITY Right 10/22/2019   Procedure: EXCISION OF RIGHT BREAST WOUND WITH PRIMARY CLOSURE;  Surgeon: [name] Going, DO;  Location: [facility];  Service: Plastics;  Laterality: Right;   BREAST LUMPECTOMY     DEBRIDEMENT AND CLOSURE WOUND Right 10/22/2019   Procedure: Excision of right breast wound;  Surgeon: [name] Going, DO;  Location: [facility];  Service: Plastics;  Laterality: Right;   MASTECTOMY W/ SENTINEL NODE BIOPSY Bilateral 09/05/2019   Procedure: BILATERAL MASTECTOMY WITH RIGHT SENTINEL LYMPH NODE BIOPSY;  Surgeon: Stark Klein, MD;  Location: [facility];  Service: General;  Laterality: Bilateral;  COMBINED WITH REGIONAL FOR POST OP PAIN   PORTACATH PLACEMENT Left 09/05/2019   Procedure: INSERTION PORT-A-CATH WITH ULTRASOUND GUIDANCE;  Surgeon: Stark Klein, MD;  Location: [facility];  Service: General;  Laterality: Left;   TONSILLECTOMY     TUBAL LIGATION     WISDOM TOOTH EXTRACTION      There were no vitals filed for this visit.   Subjective Assessment - 02/21/21 1018     Subjective I am so frustrated with my Rt arm. Mostly just of how it comes and goes and I can't always figure out what's causing it. I had to cancel my next few appts bc my mom passed away so I have to fly to [place].    Pertinent History Bil masectomy 09/05/19 with 3 lymph nodes removed and negative on the R and benign tisuse on the L. Pt had to ago for another debridement on August 28 by Dr. Marla Roe due to delayed healing and necrosis. Pt did not have radiation  Patient was diagnosed on 06/09/2019 with right grade III invasive ductal carcinoma breast cancer. It mesures 1.3 cm in the upper outer quadrant with 1.6 cm of calcifications and DCIS. It is ER positive, PR negative, and HER2 positive with a  Ki67 of 50%    Patient Stated Goals Pt is going paddleboarding in June    Currently in Pain? Yes    Pain Score 4     Pain Location Shoulder    Pain Orientation Right    Pain Descriptors / Indicators Tender    Pain Type Chronic pain    Pain Radiating Towards arm and lat    Pain Onset More than a month ago    Pain Frequency Constant    Aggravating Factors  movement like reaching but hurts at night too    Pain Relieving Factors rest                               [facility] Adult PT Treatment/Exercise - 02/21/21 0001       Manual Therapy   Soft tissue mobilization In Supine with cocoa butter to Rt shoulder, then into Lt S/L for work at medial scapular border where pt palpably very tight, also along lats where she reports tender  to touch    Passive ROM In Supine for gentle ROM into flexion, abduction and D2, pt with pain at end flexion and abduction, D2 was more tolerable                          PT Long Term Goals - 02/02/21 1459       PT LONG TERM GOAL #1   Title Pt will improve Rt shoulder flexion to 150 to return to prior motion    Baseline 130 on 02/02/21    Time 8    Period Weeks    Status New      PT LONG TERM GOAL #2   Title Pt will improve Rt shoulder abduction to at least 150 to improve AROM    Baseline 105 on 02/02/21    Time 8    Period Weeks    Status New      PT LONG TERM GOAL #3   Title Pt will report no resting pain in the arm    Time 8    Period Weeks    Status New      PT LONG TERM GOAL #4   Title Pt will improve Rt shoulder strength to at least 4/5 without increased pain    Time 8    Period Weeks    Status New                   Plan - 02/21/21 1210     Clinical Impression Statement Pt comes in reporting very frustrated with her Rt UE as she reports overall she has had pain since surgery but this has progressively gotten worse. She also isn't always able to tell what makes her pain worse which is more frustrating. During session today we discussed pt seeing an orthopedist as she is worried she has an injury at this point as end flexion and abduction was increasing her pain, despite scapular depression by therapist and gentle motions, as this has been occuring for awhile now. Explained to pt that it may be a good idea at this time, also as she is getting ready to travel for her mothers funeral and istting for long periods of time also seem to exacerbate her pain. So pt plans to call an orthopedist today. Focused on manual therapy working to decrease her overall pain while trying to improve her end Rt shoulder motions,  which were all limited by pain. Pt also has a small knot inferior to mascectomy incision and she reports this has gotten bigger over past month or  so so encouraged her to also reach out to her oncologist to update him about that, especially since her mother just passed away from her breast cancer having metastisized so she is very nervous about this. She verbalized understanding and agreed.    Personal Factors and Comorbidities Comorbidity 3+    Comorbidities bil masectomy with 3 lymph node removal on the R, chemotherapy, ongoing infusions    Examination-Activity Limitations Squat;Lift;Caring for Others;Reach Overhead    Examination-Participation Restrictions Occupation;Community Activity    Stability/Clinical Decision Making Stable/Uncomplicated    Rehab Potential Good    PT Frequency 2x / week    PT Duration 4 weeks    PT Treatment/Interventions Therapeutic activities;Therapeutic exercise;Neuromuscular re-education;Manual techniques;Cryotherapy;Moist Heat;ADLs/Self Care Home Management;Manual lymph drainage;Passive range of motion;Dry needling;Taping;Patient/family education;Functional mobility training    PT Next Visit Plan Did pt see orthopedist? Speak with oncologist about new bump inferior to incision? D2 ext Scap, review isometrics and add flexion,abd, continue supine stabs, PROM, progress to strength as tolerated. consider adding Ionto to plan if she continues with tenderness at prox biceps    PT Home Exercise Plan Isometrics for Rt shoulder    Consulted and Agree with Plan of Care Patient             Patient will benefit from skilled therapeutic intervention in order to improve the following deficits and impairments:  Decreased knowledge of precautions, Pain, Decreased range of motion, Increased edema  Visit Diagnosis: Abnormal posture  Right shoulder pain, unspecified chronicity  Muscle weakness (generalized)  Aftercare following surgery for neoplasm     Problem List Patient Active Problem List   Diagnosis Date Noted   Neck pain on left side 06/02/2020   Chronic left-sided low back pain with left-sided sciatica  06/02/2020   Paresthesia 05/13/2020   Malignant neoplasm of right female breast ([place]) 05/13/2020   Pulmonary emphysema ([place]) 04/15/2020   Current smoker 04/15/2020   Other allergy status, other than to drugs and biological substances 04/15/2020   Pure hypercholesterolemia 04/15/2020   Skin sensation disturbance 04/15/2020   Spasm 04/15/2020   Osteoporosis 03/12/2020   Acquired absence of breast and absent nipple, bilateral 10/07/2019   Breast wound, right, initial encounter 10/07/2019   Breast cancer of upper-outer quadrant of right female breast ([place]) 09/05/2019   Genetic testing 08/12/2019   Family history of BRCA gene mutation 07/31/2019   Family history of breast cancer    Family history of prostate cancer    Family history of bladder cancer    Malignant neoplasm of upper-outer quadrant of right breast in female, estrogen receptor positive ([place]) 07/24/2019   Breast cancer ([place]) 07/18/2019   Annual physical exam 03/31/2016   Occupational bronchitis ([place]) 03/03/2015   Hyperlipidemia 03/30/2014   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

## 2021-03-07 ENCOUNTER — Ambulatory Visit: Payer: BC Managed Care – PPO | Attending: Hematology & Oncology

## 2021-03-07 ENCOUNTER — Other Ambulatory Visit: Payer: Self-pay

## 2021-03-07 DIAGNOSIS — R293 Abnormal posture: Secondary | ICD-10-CM | POA: Diagnosis not present

## 2021-03-07 DIAGNOSIS — Z483 Aftercare following surgery for neoplasm: Secondary | ICD-10-CM | POA: Insufficient documentation

## 2021-03-07 DIAGNOSIS — M25511 Pain in right shoulder: Secondary | ICD-10-CM | POA: Diagnosis present

## 2021-03-07 DIAGNOSIS — M6281 Muscle weakness (generalized): Secondary | ICD-10-CM | POA: Insufficient documentation

## 2021-03-07 NOTE — Therapy (Signed)
[facility] @ [address] [place], [place], [address] Phone: [phone]   Fax:  [phone]  Physical Therapy Treatment  Patient Details  Name: Stephanie Chase MRN: [id number] Date of Birth: 02/07/1961 Referring Provider (PT): Dr. Marin Olp   Encounter Date: 03/07/2021   PT End of Session - 03/07/21 1405     Visit Number 13    Number of Visits 18    Date for PT Re-Evaluation 03/30/21    PT Start Time 1301    PT Stop Time 1401    PT Time Calculation (min) 60 min    Activity Tolerance Patient tolerated treatment well;Patient limited by pain    Behavior During Therapy [facility] for tasks assessed/performed             Past Medical History:  Diagnosis Date   Anemia    Anxiety    Asthma    Breast cancer ([place])    right, 05/13/20 completed Taxol, on Herceptin   Cervical paraspinous muscle spasm    COPD (chronic obstructive pulmonary disease) ([place])    emphysema   Discoid lupus    Discoid lupus    Family history of bladder cancer    Family history of BRCA gene mutation    Family history of breast cancer    Family history of prostate cancer    GERD (gastroesophageal reflux disease)    Hypercholesteremia    Hypothyroidism    Neuropathy    Pneumonia    Thyroid disease     Past Surgical History:  Procedure Laterality Date   ABLATION ON ENDOMETRIOSIS  8372   APPLICATION OF A-CELL OF EXTREMITY Right 10/22/2019   Procedure: EXCISION OF RIGHT BREAST WOUND WITH PRIMARY CLOSURE;  Surgeon: [name] Going, DO;  Location: [facility];  Service: Plastics;  Laterality: Right;   BREAST LUMPECTOMY     DEBRIDEMENT AND CLOSURE WOUND Right 10/22/2019   Procedure: Excision of right breast wound;  Surgeon: [name] Going, DO;  Location: [facility];  Service: Plastics;  Laterality: Right;   MASTECTOMY W/ SENTINEL NODE BIOPSY Bilateral 09/05/2019   Procedure: BILATERAL MASTECTOMY WITH RIGHT SENTINEL LYMPH NODE BIOPSY;  Surgeon: Stark Klein, MD;  Location: [facility];  Service: General;  Laterality: Bilateral;  COMBINED WITH REGIONAL FOR POST OP PAIN   PORTACATH PLACEMENT Left 09/05/2019   Procedure: INSERTION PORT-A-CATH WITH ULTRASOUND GUIDANCE;  Surgeon: Stark Klein, MD;  Location: [facility];  Service: General;  Laterality: Left;   TONSILLECTOMY     TUBAL LIGATION     WISDOM TOOTH EXTRACTION      There were no vitals filed for this visit.   Subjective Assessment - 03/07/21 1322     Subjective My Rt shoulder is hurting into my upper arm today, it's a larger area of pain than usual. I see a rheumatoid doctor (Dr. Benjamine Mola) Wed and an orthopedist (Dr. Hulan Saas) on Fri. I ordered my TENS unit and I brought that so you can show me how to use it. And then I see the oncologist Monday about the bump under my masectomy incision.    Pertinent History Bil masectomy 09/05/19 with 3 lymph nodes removed and negative on the R and benign tisuse on the L. Pt had to ago for another debridement on August 28 by Dr. Marla Roe due to delayed healing and necrosis. Pt did not have radiation  Patient was diagnosed on 06/09/2019 with right grade III invasive ductal carcinoma breast cancer. It mesures 1.3 cm in the  upper outer quadrant with 1.6 cm of calcifications and DCIS. It is ER positive, PR negative, and HER2 positive with a Ki67 of 50%    Patient Stated Goals Pt is going paddleboarding in June    Currently in Pain? Yes    Pain Score 5     Pain Location Arm    Pain Orientation Right    Pain Descriptors / Indicators Squeezing   pinching in the elbow   Pain Type Chronic pain    Pain Radiating Towards shoulder into upper arm    Pain Onset More than a month ago    Pain Frequency Constant    Aggravating Factors  not sure    Pain Relieving Factors rest                               [facility] Adult PT Treatment/Exercise - 03/07/21 0001       Self-Care   Self-Care Other Self-Care Comments    Other Self-Care Comments  Instructed pt  in TENS unit use and precautions.      Manual Therapy   Soft tissue mobilization In Supine with cocoa butter to Rt shoulder, then into Lt S/L for work at medial scapular border where pt palpably very tight and tender, also along lats and pt seemed less tender here today    Passive ROM In Supine for gentle ROM into flexion and abduction, pt with pain with flexion and abduction that was limiting her end motions                          PT Long Term Goals - 02/02/21 1459       PT LONG TERM GOAL #1   Title Pt will improve Rt shoulder flexion to 150 to return to prior motion    Baseline 130 on 02/02/21    Time 8    Period Weeks    Status New      PT LONG TERM GOAL #2   Title Pt will improve Rt shoulder abduction to at least 150 to improve AROM    Baseline 105 on 02/02/21    Time 8    Period Weeks    Status New      PT LONG TERM GOAL #3   Title Pt will report no resting pain in the arm    Time 8    Period Weeks    Status New      PT LONG TERM GOAL #4   Title Pt will improve Rt shoulder strength to at least 4/5 without increased pain    Time 8    Period Weeks    Status New                   Plan - 03/07/21 1405     Clinical Impression Statement Pt brought her new TENS unit today so spent time at beginning of session educating her in how to use this and precautions. Then continued with manual therapy. Her Rt shoulder P/ROM is limited due to pain but this does seem to marginally improve after STM. Pt sees orthopedist Dr. Tamala Julian Friday and will consider keeping her Thursday appt with [place] due to pain.    Personal Factors and Comorbidities Comorbidity 3+    Comorbidities bil masectomy with 3 lymph node removal on the R, chemotherapy, ongoing infusions    Examination-Activity Limitations Squat;Lift;Caring for [employer]  Examination-Participation Restrictions Occupation;Community Activity    Stability/Clinical Decision Making Stable/Uncomplicated     Rehab Potential Good    PT Frequency 2x / week    PT Duration 4 weeks    PT Treatment/Interventions Therapeutic activities;Therapeutic exercise;Neuromuscular re-education;Manual techniques;Cryotherapy;Moist Heat;ADLs/Self Care Home Management;Manual lymph drainage;Passive range of motion;Dry needling;Taping;Patient/family education;Functional mobility training    PT Next Visit Plan Did pt see orthopedist? Speak with oncologist about new bump inferior to incision? D2 ext Scap, review isometrics and add flexion,abd, continue supine stabs, PROM, progress to strength as tolerated. consider adding Ionto to plan if she continues with tenderness at prox biceps             Patient will benefit from skilled therapeutic intervention in order to improve the following deficits and impairments:  Decreased knowledge of precautions, Pain, Decreased range of motion, Increased edema  Visit Diagnosis: Abnormal posture  Right shoulder pain, unspecified chronicity  Muscle weakness (generalized)  Aftercare following surgery for neoplasm     Problem List Patient Active Problem List   Diagnosis Date Noted   Neck pain on left side 06/02/2020   Chronic left-sided low back pain with left-sided sciatica 06/02/2020   Paresthesia 05/13/2020   Malignant neoplasm of right female breast ([place]) 05/13/2020   Pulmonary emphysema ([facility]) 04/15/2020   Current smoker 04/15/2020   Other allergy status, other than to drugs and biological substances 04/15/2020   Pure hypercholesterolemia 04/15/2020   Skin sensation disturbance 04/15/2020   Spasm 04/15/2020   Osteoporosis 03/12/2020   Acquired absence of breast and absent nipple, bilateral 10/07/2019   Breast wound, right, initial encounter 10/07/2019   Breast cancer of upper-outer quadrant of right female breast ([place]) 09/05/2019   Genetic testing 08/12/2019   Family history of BRCA gene mutation 07/31/2019   Family history of breast cancer    Family history of  prostate cancer    Family history of bladder cancer    Malignant neoplasm of upper-outer quadrant of right breast in female, estrogen receptor positive ([facility]) 07/24/2019   Breast cancer ([place]) 07/18/2019   Annual physical exam 03/31/2016   Occupational bronchitis ([facility]) 03/03/2015   Hyperlipidemia 03/30/2014   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

## 2021-03-08 NOTE — Progress Notes (Signed)
Office Visit Note  Patient: Stephanie Chase             Date of Birth: 13-Jun-1960           MRN: [id number]             PCP: Delsa Bern, MD Referring: [name], MD Visit Date: 03/09/2021  Subjective:  New Patient (Initial Visit) (Total body pain since chemo)   History of Present Illness: Stephanie Chase is a 60 y.o. female here for evaluation of widespread body pains. Her medical history is significant for breast cancer s/p b/l mastectomy in 2021 treated with taxol/herceptin and maintained on herceptin until 10/25/20.  She was previously diagnosed with vitiligo affecting large amount of body surface area and possible discoid lupus lesions. she describes diffuse joint and overall body pains in multiple areas ongoing since starting chemotherapy for breast cancer last year.  In addition to generalized pain she had bilateral mastectomies with decreased chest and shoulder range of motion contributing to some shoulder and upper back pains for which she saw outpatient rehab.  She is also experienced increased back pain and bilateral knee pain with MRI and x-ray imaging showing minimal degenerative joint changes.  She experiences mild pedal edema bilaterally but no focal joint swelling or erythema associated with her symptoms.  She feels some knee instability if she tries to fully straighten her leg feels it is going to go into hyperextension but has never actually fallen in this way.  Frequently feels there is pain or some kind of nodules or lumps located in the anterior knee.  Currently most problematic area is at her right shoulder unable to raise overhead or lie on that side without increased pain.  She is on prolia for osteoporosis. She takes lyrica 75 mg BID and tylenol as needed for her body pains but not very well controlled.  Imaging reviewed 01/22/21 MRI Lumbar spine IMPRESSION: 1. Mild degenerative changes of the lumbar spine without foraminal or canal stenosis at any level. 2. No  suspicious bone lesion.  12/10/20 Xray left knee  Negative  12/10/20 Xray right knee IMPRESSION: An oval sclerotic focus in the anterior aspect of the tibial plateau on the right is favored to represent a bone island. No other abnormalities.  Activities of Daily Living:  Patient reports morning stiffness for 24 hours.   Patient Reports nocturnal pain.  Difficulty dressing/grooming: Reports Difficulty climbing stairs: Reports Difficulty getting out of chair: Reports Difficulty using hands for taps, buttons, cutlery, and/or writing: Denies  Review of Systems  Constitutional:  Positive for fatigue.  HENT:  Positive for mouth dryness.   Eyes:  Positive for dryness.  Respiratory:  Positive for shortness of breath.   Cardiovascular:  Negative for swelling in legs/feet.  Gastrointestinal:  Positive for constipation.  Endocrine: Positive for cold intolerance and excessive thirst.  Genitourinary:  Negative for difficulty urinating.  Musculoskeletal:  Positive for joint pain, gait problem, joint pain, morning stiffness and muscle tenderness.  Skin:  Negative for rash.  Allergic/Immunologic: Positive for susceptible to infections.  Neurological:  Negative for numbness.  Hematological:  Positive for bruising/bleeding tendency.  Psychiatric/Behavioral:  Positive for sleep disturbance.    PMFS History:  Patient Active Problem List   Diagnosis Date Noted   Pain in right shoulder 03/09/2021   Polyarthralgia 03/09/2021   Neck pain on left side 06/02/2020   Chronic left-sided low back pain with left-sided sciatica 06/02/2020   Paresthesia 05/13/2020   Malignant neoplasm of right female  breast ([facility]) 05/13/2020   Pulmonary emphysema ([place]) 04/15/2020   Current smoker 04/15/2020   Other allergy status, other than to drugs and biological substances 04/15/2020   Pure hypercholesterolemia 04/15/2020   Skin sensation disturbance 04/15/2020   Spasm 04/15/2020   Osteoporosis 03/12/2020   Acquired  absence of breast and absent nipple, bilateral 10/07/2019   Breast wound, right, initial encounter 10/07/2019   Breast cancer of upper-outer quadrant of right female breast ([place]) 09/05/2019   Genetic testing 08/12/2019   Family history of BRCA gene mutation 07/31/2019   Family history of breast cancer    Family history of prostate cancer    Family history of bladder cancer    Malignant neoplasm of upper-outer quadrant of right breast in female, estrogen receptor positive ([place]) 07/24/2019   Breast cancer ([place]) 07/18/2019   Annual physical exam 03/31/2016   Occupational bronchitis ([place]) 03/03/2015   Hyperlipidemia 03/30/2014   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   Generalized osteoarthritis 02/23/2012   Plantar fasciitis, bilateral 02/23/2012   Vitiligo 02/23/2012   Difficulty hearing 02/13/2012   History of lupus 12/21/2011   Vitamin D deficiency 10/11/2010   Hypothyroidism, unspecified 06/21/2010   Lupus erythematosus 06/14/2010    Past Medical History:  Diagnosis Date   Anemia    Anxiety    Asthma    Breast cancer ([place])    right, 05/13/20 completed Taxol, on Herceptin   Cervical paraspinous muscle spasm    COPD (chronic obstructive pulmonary disease) ([facility])    emphysema   Discoid lupus    Discoid lupus    Family history of bladder cancer    Family history of BRCA gene mutation    Family history of breast cancer    Family history of prostate cancer    GERD (gastroesophageal reflux disease)    Hypercholesteremia    Hypothyroidism    Neuropathy    Osteoporosis    Pneumonia    Thyroid disease     Family History  Problem Relation Age of Onset   Breast cancer Mother 62   Diabetes Mother    Bladder Cancer Mother 47       ureteral cancer   Cancer Mother        blood cancer   Heart attack Father    Hyperlipidemia Father    Hypertension Father    Alzheimer's disease Father    Prostate cancer Father        dx. >50   Cancer Maternal Uncle        prostate or colon   BRCA 1/2  Daughter    Cancer Cousin        unknown type; dx. in her 24s (maternal cousin)   Alzheimer's disease Other    Breast cancer Other 50       maternal great-aunt   Past Surgical History:  Procedure Laterality Date   ABLATION ON ENDOMETRIOSIS  2694   APPLICATION OF A-CELL OF EXTREMITY Right 10/22/2019   Procedure: EXCISION OF RIGHT BREAST WOUND WITH PRIMARY CLOSURE;  Surgeon: [name] Going, DO;  Location: [facility];  Service: Plastics;  Laterality: Right;   BREAST LUMPECTOMY     DEBRIDEMENT AND CLOSURE WOUND Right 10/22/2019   Procedure: Excision of right breast wound;  Surgeon: [name] Going, DO;  Location: [facility];  Service: Plastics;  Laterality: Right;   MASTECTOMY W/ SENTINEL NODE BIOPSY Bilateral 09/05/2019   Procedure: BILATERAL MASTECTOMY WITH RIGHT SENTINEL LYMPH NODE BIOPSY;  Surgeon: Stark Klein, MD;  Location: [facility];  Service: General;  Laterality: Bilateral;  COMBINED WITH REGIONAL FOR POST OP PAIN   PORTACATH PLACEMENT Left 09/05/2019   Procedure: INSERTION PORT-A-CATH WITH ULTRASOUND GUIDANCE;  Surgeon: Stark Klein, MD;  Location: [facility];  Service: General;  Laterality: Left;   TONSILLECTOMY     TUBAL LIGATION     WISDOM TOOTH EXTRACTION     Social History   Social History Narrative   Not on file   Immunization History  Administered Date(s) Administered   Influenza,inj,Quad PF,6+ Mos 12/20/2016, 01/26/2020   Influenza-Unspecified 12/18/2013, 01/03/2016   PFIZER Comirnaty(Gray Top)Covid-19 Tri-Sucrose Vaccine 07/05/2020   PFIZER(Purple Top)SARS-COV-2 Vaccination 06/07/2019, 07/01/2019   Tdap 09/14/2017     Objective: Vital Signs: BP 119/77 (BP Location: Left Arm, Patient Position: Sitting, Cuff Size: Normal)    Pulse 90    Resp 16    Ht _0  (1.651 m)    Wt 177 lb (80.3 kg)    BMI 29.45 kg/m    Physical Exam HENT:     Right Ear: External ear normal.     Left Ear: External ear normal.     Mouth/Throat:     Mouth: Mucous membranes are moist.      Pharynx: Oropharynx is clear.  Eyes:     Conjunctiva/sclera: Conjunctivae normal.  Cardiovascular:     Rate and Rhythm: Normal rate and regular rhythm.  Pulmonary:     Effort: Pulmonary effort is normal.     Breath sounds: Normal breath sounds.  Musculoskeletal:     Right lower leg: No edema.     Left lower leg: No edema.  Skin:    General: Skin is warm and dry.     Findings: No rash.  Neurological:     Mental Status: She is alert.     Deep Tendon Reflexes: Reflexes normal.  Psychiatric:     Comments: Somewhat flat affect     Musculoskeletal Exam:  Neck full ROM some paraspinal muscle tenderness extending into upper back Right shoulder severely painful with abduction above horizontal but passive range of motion is intact, decreased external rotation Elbows full ROM no tenderness or swelling Wrists full ROM no tenderness or swelling Fingers full ROM no tenderness or swelling Knees full ROM no tenderness or swelling, patellofemoral crepitus is present, medial joint line tenderness to pressure no laxity or pain to varus and valgus pressure Ankles full ROM no tenderness or swelling    Investigation: No additional findings.  Imaging: XR Shoulder Right  Result Date: 03/09/2021 X-ray right shoulder 4 views Shoulder in normal position with internal and external rotation.  Glenohumeral joint space appears normal.  No significant narrowing of AC joint width.  No visible effusion no abnormal calcifications seen.  Few opacities consistent with previous surgery seen in lateral chest wall. Impression Normal right shoulder x-ray   Recent Labs: Lab Results  Component Value Date   WBC 7.2 01/26/2021   HGB 14.6 01/26/2021   PLT 290 01/26/2021   NA 137 01/26/2021   K 4.2 01/26/2021   CL 102 01/26/2021   CO2 28 01/26/2021   GLUCOSE 103 (H) 01/26/2021   BUN 10 01/26/2021   CREATININE 0.75 01/26/2021   BILITOT 0.4 01/26/2021   ALKPHOS 85 01/26/2021   AST 22 01/26/2021   ALT 21  01/26/2021   PROT 7.5 01/26/2021   ALBUMIN 4.4 01/26/2021   CALCIUM 11.2 (H) 01/26/2021   GFRAA >60 12/29/2019    Speciality Comments: No specialty comments available.  Procedures:  No procedures performed Allergies: Bactrim [sulfamethoxazole-trimethoprim]  Assessment / Plan:     Visit Diagnoses: Polyarthralgia - Plan: Rheumatoid factor, Cyclic citrul peptide antibody, IgG, Sedimentation rate, C-reactive protein, CK, ANA  Diffuse pains and stiffness no specific inflammatory changes present on exam today. Will check markers for underlying causes including RA, ANA, checking serum inflammatory markers as well. May have some degree of neuropathic pain or myofascial/fibromyalgia symptoms contributing, but already on SNRI treatment without a large improvement. She is looking to avoid taking excessive medicines or becoming sedated from side effects so not starting anything empirically today, will definitely follow up after results reviewed.  Generalized osteoarthritis   Generalized osteoarthritis changes are seen but with normal joint range of motion minimal radiographic findings do not seem in proportion to her current pain and stiffness problems.  Discoid lupus  History of discoid lupus no systemic disease activity or past immunosuppression rechecking basic serology today.  Chronic right shoulder pain   Right shoulder pain is worse for the past 6 months at least physical exam suggestive for impingement.  X-ray obtained today is without any significant osteoarthritis or visible joint effusion. Unless other specific problem identified would recommend trial of local joint injection and ROM exercises.  Orders: Orders Placed This Encounter  Procedures   XR Shoulder Right   Rheumatoid factor   Cyclic citrul peptide antibody, IgG   Sedimentation rate   C-reactive protein   CK   ANA    No orders of the defined types were placed in this encounter.    Follow-Up Instructions: No  follow-ups on file.   Collier Salina, MD  Note - This record has been created using [employer].  Chart creation errors have been sought, but may not always  have been located. Such creation errors do not reflect on  the standard of medical care.

## 2021-03-09 ENCOUNTER — Other Ambulatory Visit: Payer: Self-pay

## 2021-03-09 ENCOUNTER — Ambulatory Visit (INDEPENDENT_AMBULATORY_CARE_PROVIDER_SITE_OTHER): Payer: BC Managed Care – PPO | Admitting: Internal Medicine

## 2021-03-09 ENCOUNTER — Encounter: Payer: Self-pay | Admitting: Internal Medicine

## 2021-03-09 ENCOUNTER — Ambulatory Visit: Payer: Self-pay

## 2021-03-09 VITALS — BP 119/77 | HR 90 | Resp 16 | Ht 65.0 in | Wt 177.0 lb

## 2021-03-09 DIAGNOSIS — G8929 Other chronic pain: Secondary | ICD-10-CM

## 2021-03-09 DIAGNOSIS — L930 Discoid lupus erythematosus: Secondary | ICD-10-CM

## 2021-03-09 DIAGNOSIS — M159 Polyosteoarthritis, unspecified: Secondary | ICD-10-CM

## 2021-03-09 DIAGNOSIS — M2550 Pain in unspecified joint: Secondary | ICD-10-CM

## 2021-03-09 DIAGNOSIS — M25511 Pain in right shoulder: Secondary | ICD-10-CM | POA: Diagnosis not present

## 2021-03-09 HISTORY — DX: Pain in unspecified joint: M25.50

## 2021-03-09 HISTORY — DX: Pain in right shoulder: M25.511

## 2021-03-09 NOTE — Patient Instructions (Signed)
F/U as planned

## 2021-03-11 ENCOUNTER — Ambulatory Visit: Payer: BC Managed Care – PPO | Admitting: Family Medicine

## 2021-03-11 LAB — C-REACTIVE PROTEIN: CRP: 2.5 mg/L (ref <8.0)

## 2021-03-11 LAB — CYCLIC CITRUL PEPTIDE ANTIBODY, IGG: Cyclic Citrullin Peptide Ab: <16 UNITS

## 2021-03-11 LAB — SEDIMENTATION RATE: Sed Rate: 43 mm/h — ABNORMAL HIGH (ref 0–30)

## 2021-03-11 LAB — CK: Total CK: 72 U/L (ref 29–143)

## 2021-03-11 LAB — ANA: Anti Nuclear Antibody (ANA): NEGATIVE

## 2021-03-11 LAB — RHEUMATOID FACTOR: Rheumatoid fact SerPl-aCnc: <14 IU/mL (ref <14)

## 2021-03-11 NOTE — Telephone Encounter (Signed)
I spoke with Stephanie Chase she has had increased pain in the upper back since our visit partially improving now with NSAIDs after about 2 days. I think this is likely some muscle spasm or strain related to moving her shoulder outside of the ROM she has been restricted to lately. We have plan to follow up in a couple weeks can take another look at that time.

## 2021-03-14 ENCOUNTER — Ambulatory Visit (HOSPITAL_BASED_OUTPATIENT_CLINIC_OR_DEPARTMENT_OTHER)
Admission: RE | Admit: 2021-03-14 | Discharge: 2021-03-14 | Disposition: A | Discharge status: Home / Self Care | Payer: BC Managed Care – PPO | Source: Ambulatory Visit | Attending: Hematology & Oncology | Admitting: Hematology & Oncology

## 2021-03-14 ENCOUNTER — Inpatient Hospital Stay: Payer: BC Managed Care – PPO | Attending: Hematology

## 2021-03-14 ENCOUNTER — Inpatient Hospital Stay (HOSPITAL_BASED_OUTPATIENT_CLINIC_OR_DEPARTMENT_OTHER): Payer: BC Managed Care – PPO | Admitting: Hematology & Oncology

## 2021-03-14 ENCOUNTER — Encounter: Payer: Self-pay | Admitting: *Deleted

## 2021-03-14 ENCOUNTER — Other Ambulatory Visit: Payer: Self-pay

## 2021-03-14 ENCOUNTER — Ambulatory Visit: Payer: BC Managed Care – PPO

## 2021-03-14 ENCOUNTER — Encounter: Payer: Self-pay | Admitting: Hematology & Oncology

## 2021-03-14 VITALS — BP 125/77 | HR 78 | Temp 98.2°F | Resp 16 | Wt 174.0 lb

## 2021-03-14 DIAGNOSIS — C50411 Malignant neoplasm of upper-outer quadrant of right female breast: Secondary | ICD-10-CM | POA: Diagnosis present

## 2021-03-14 DIAGNOSIS — R294 Clicking hip: Secondary | ICD-10-CM | POA: Insufficient documentation

## 2021-03-14 DIAGNOSIS — M25552 Pain in left hip: Secondary | ICD-10-CM | POA: Insufficient documentation

## 2021-03-14 DIAGNOSIS — Z170 Estrogen receptor positive status [ER+]: Secondary | ICD-10-CM | POA: Diagnosis not present

## 2021-03-14 DIAGNOSIS — R293 Abnormal posture: Secondary | ICD-10-CM

## 2021-03-14 DIAGNOSIS — Z483 Aftercare following surgery for neoplasm: Secondary | ICD-10-CM

## 2021-03-14 DIAGNOSIS — M6281 Muscle weakness (generalized): Secondary | ICD-10-CM

## 2021-03-14 DIAGNOSIS — M25511 Pain in right shoulder: Secondary | ICD-10-CM

## 2021-03-14 LAB — CBC WITH DIFFERENTIAL (CANCER CENTER ONLY)
Abs Immature Granulocytes: 0.02 10*3/uL (ref 0.00–0.07)
Basophils Absolute: 0.1 10*3/uL (ref 0.0–0.1)
Basophils Relative: 1 %
Eosinophils Absolute: 0 10*3/uL (ref 0.0–0.5)
Eosinophils Relative: 1 %
HCT: 44.4 % (ref 36.0–46.0)
Hemoglobin: 14.6 g/dL (ref 12.0–15.0)
Immature Granulocytes: 0 %
Lymphocytes Relative: 32 %
Lymphs Abs: 1.9 10*3/uL (ref 0.7–4.0)
MCH: 28.2 pg (ref 26.0–34.0)
MCHC: 32.9 g/dL (ref 30.0–36.0)
MCV: 85.9 fL (ref 80.0–100.0)
Monocytes Absolute: 0.6 10*3/uL (ref 0.1–1.0)
Monocytes Relative: 10 %
Neutro Abs: 3.4 10*3/uL (ref 1.7–7.7)
Neutrophils Relative %: 56 %
Platelet Count: 272 10*3/uL (ref 150–400)
RBC: 5.17 MIL/uL — ABNORMAL HIGH (ref 3.87–5.11)
RDW: 13.2 % (ref 11.5–15.5)
WBC Count: 6 10*3/uL (ref 4.0–10.5)
nRBC: 0 % (ref 0.0–0.2)

## 2021-03-14 LAB — CMP (CANCER CENTER ONLY)
ALT: 13 U/L (ref 0–44)
AST: 20 U/L (ref 15–41)
Albumin: 4.3 g/dL (ref 3.5–5.0)
Alkaline Phosphatase: 79 U/L (ref 38–126)
Anion gap: 7 (ref 5–15)
BUN: 10 mg/dL (ref 6–20)
CO2: 28 mmol/L (ref 22–32)
Calcium: 10.9 mg/dL — ABNORMAL HIGH (ref 8.9–10.3)
Chloride: 102 mmol/L (ref 98–111)
Creatinine: 0.71 mg/dL (ref 0.44–1.00)
GFR, Estimated: >60 mL/min (ref >60)
Glucose, Bld: 93 mg/dL (ref 70–99)
Potassium: 4.1 mmol/L (ref 3.5–5.1)
Sodium: 137 mmol/L (ref 135–145)
Total Bilirubin: 0.4 mg/dL (ref 0.3–1.2)
Total Protein: 7.5 g/dL (ref 6.5–8.1)

## 2021-03-14 LAB — LACTATE DEHYDROGENASE: LDH: 161 U/L (ref 98–192)

## 2021-03-14 IMAGING — CR DG HIP (WITH OR WITHOUT PELVIS) 4+V*L*
3 series · 3 of 3 positions shown · non-contrast
Comparison: None.

CLINICAL DATA: pain and "clicking" of LEFT hip. h/o stage I breast
cancer

EXAM:
DG HIP (WITH OR WITHOUT PELVIS) 4+V LEFT

[t pelvis a.p.]
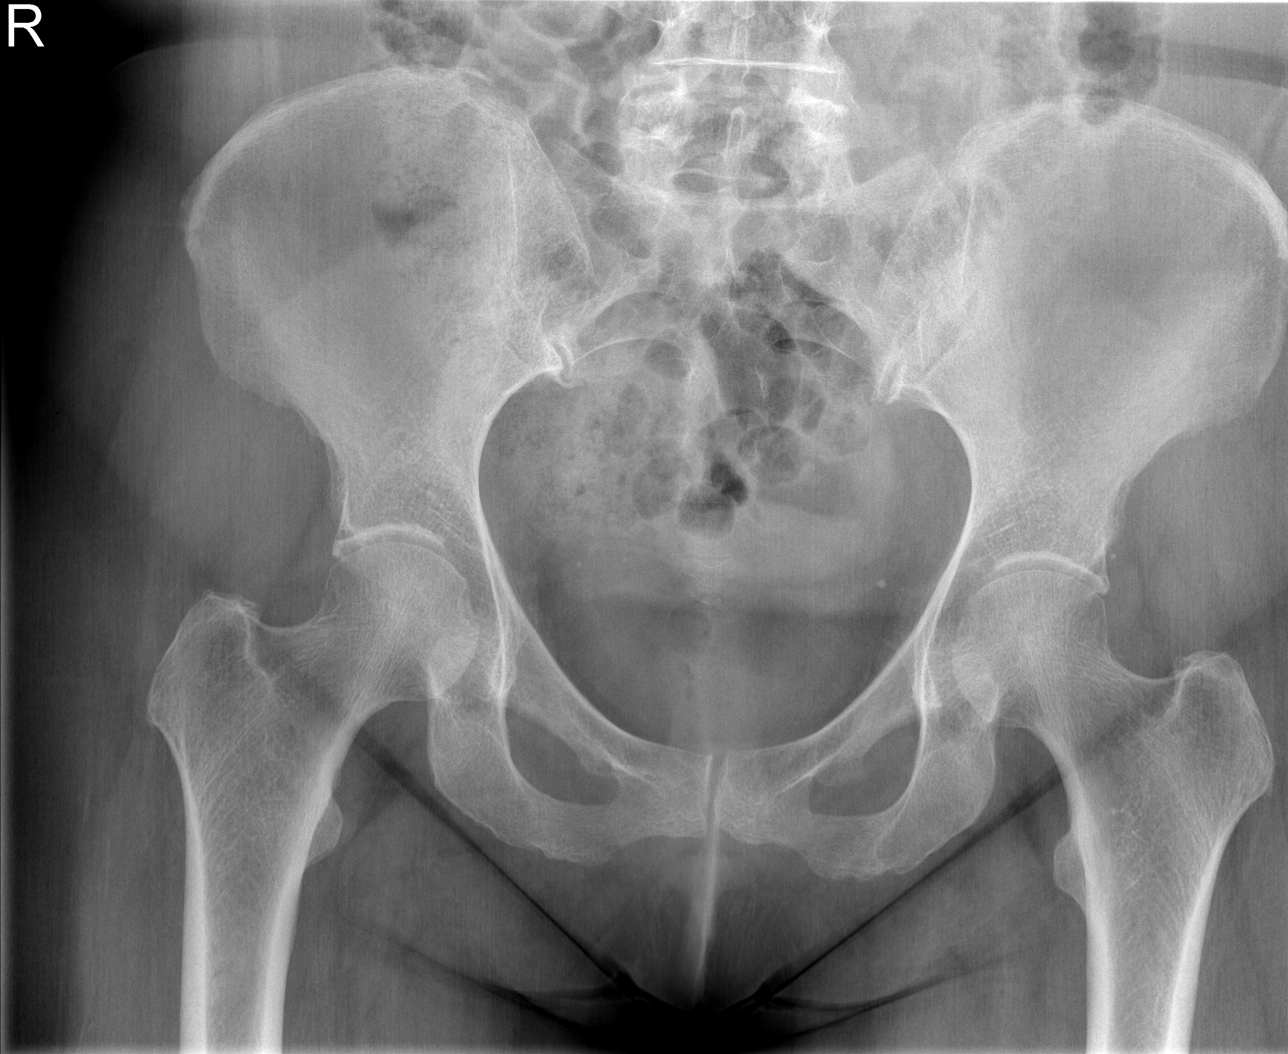

[t hip ap left]
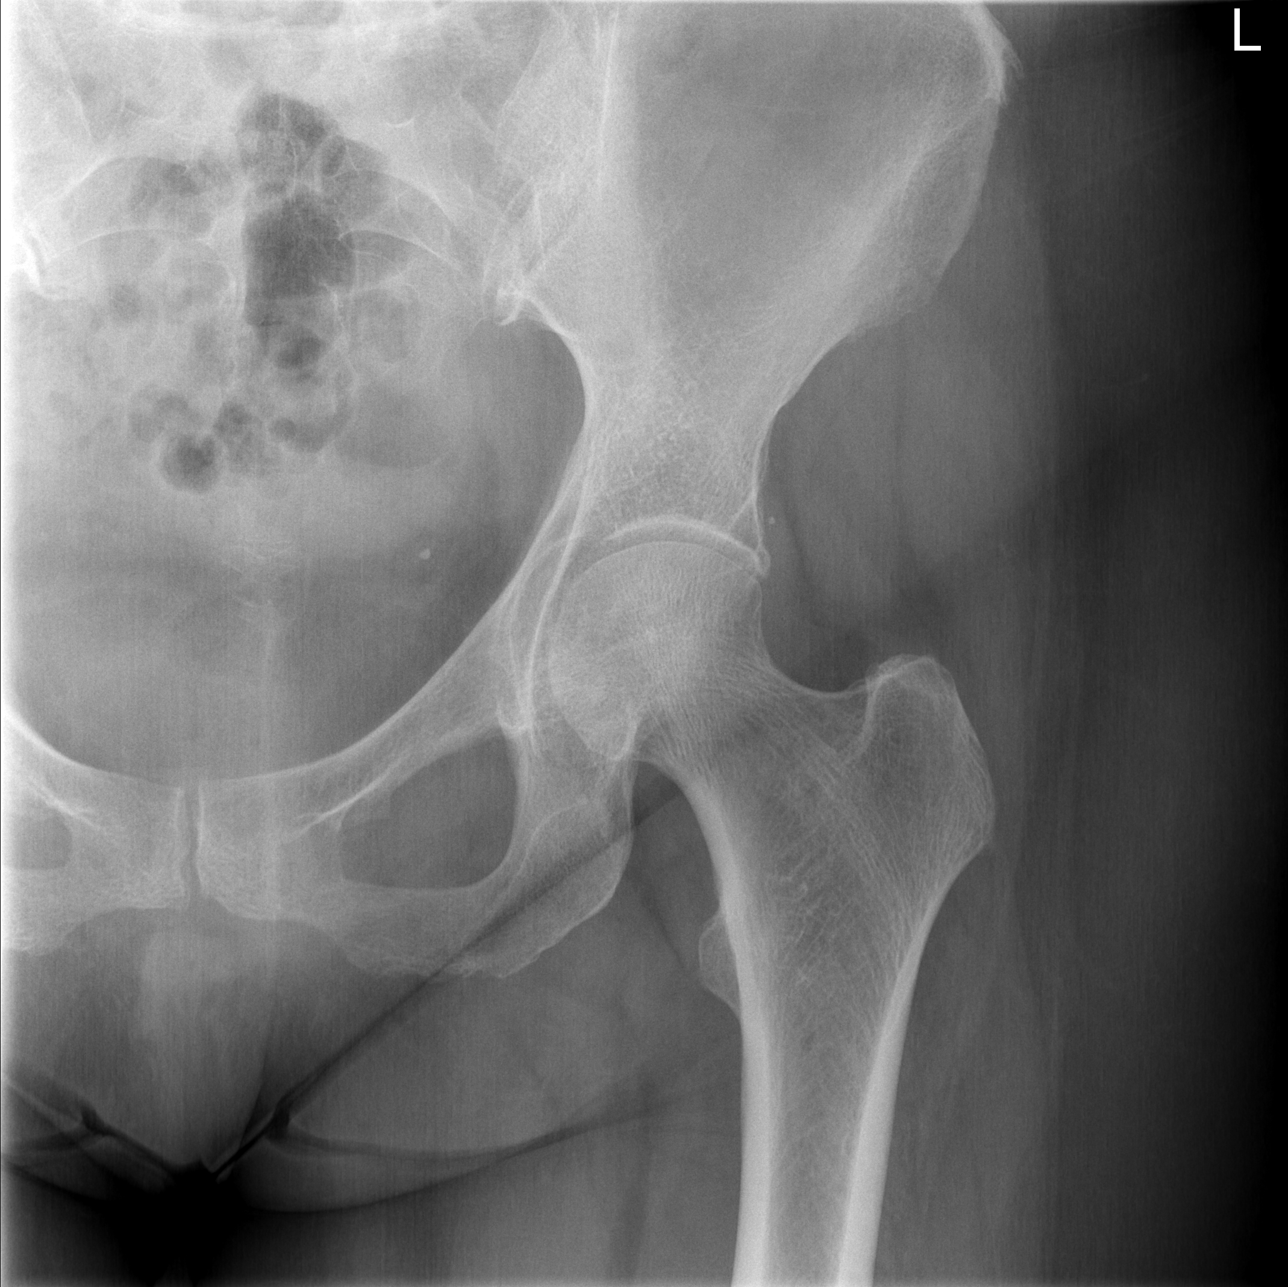

[t hip frog leg left]
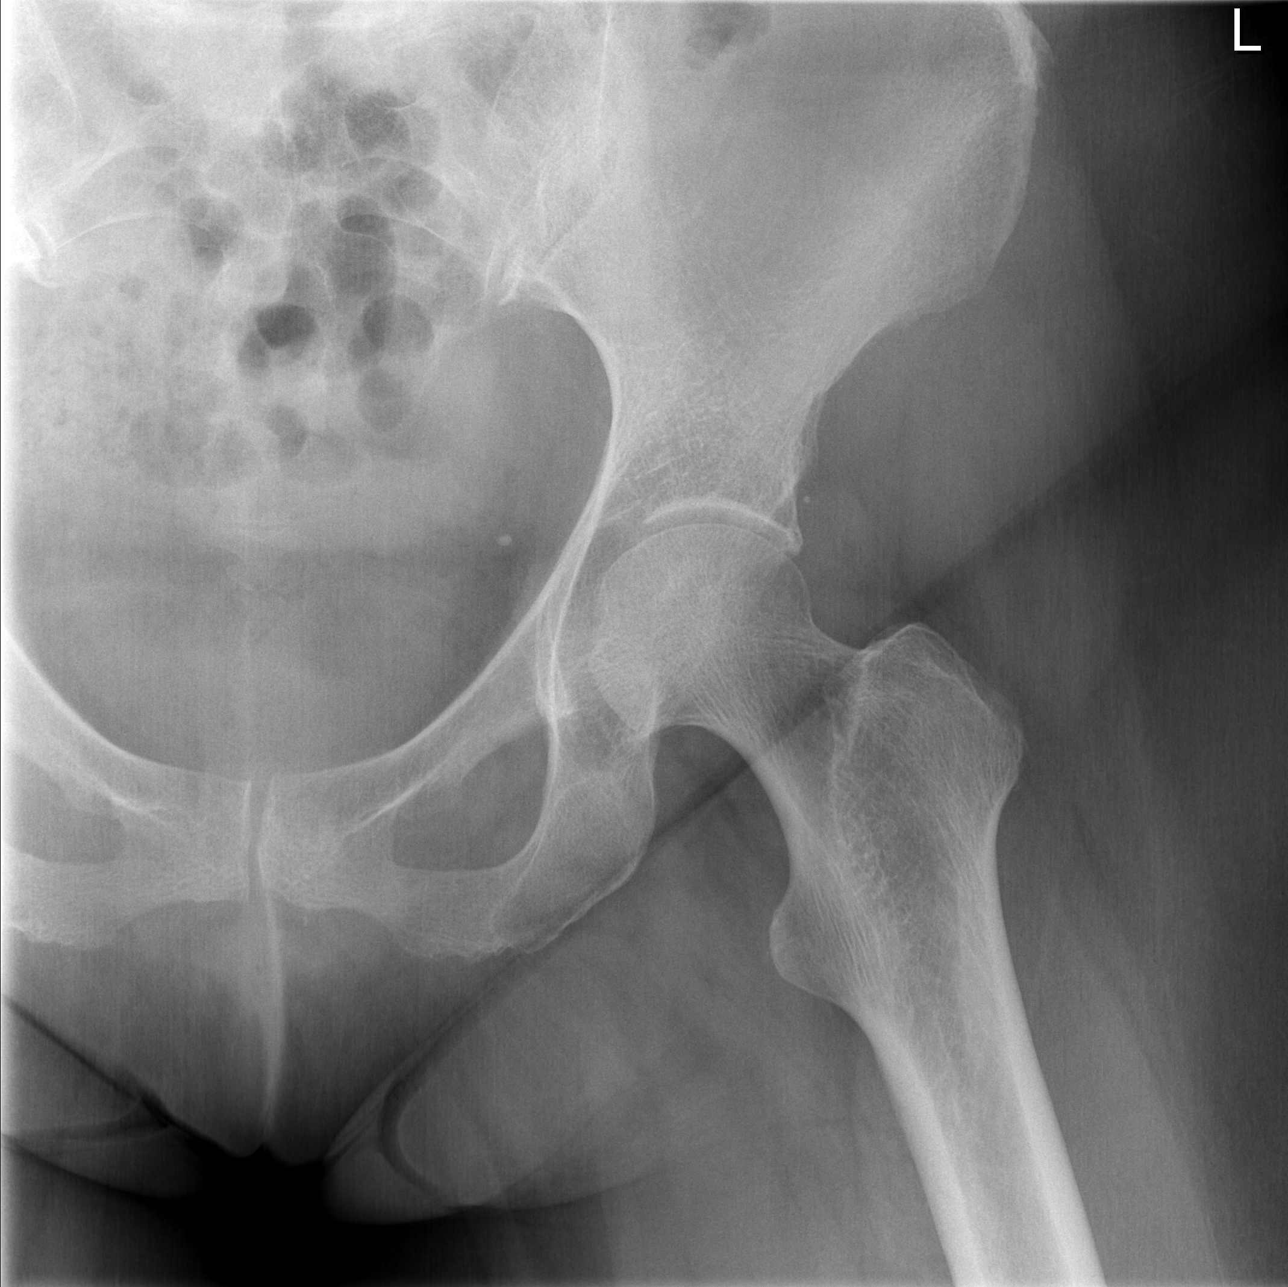

[3 of 3 positions shown; findings below may reference images not displayed]

FINDINGS: Frontal view of the pelvis as well as frontal and frogleg lateral
views of the left hip are obtained. No acute fracture, subluxation,
or dislocation. Joint spaces are well preserved. Sacroiliac joints
are normal. Soft tissues are unremarkable.
IMPRESSION: 1. Unremarkable pelvis and left hip.

## 2021-03-14 NOTE — Therapy (Addendum)
[facility] @ [address] [place], [place], [address] Phone: [phone]   Fax:  [phone]  Physical Therapy Treatment  Patient Details  Name: Sherrye Puga MRN: [id number] Date of Birth: [birth year] Referring Provider (PT): Dr. Marin Olp   Encounter Date: 03/14/2021   PT End of Session - 03/14/21 1547     Visit Number 14    Number of Visits 18    Date for PT Re-Evaluation 03/30/21    PT Start Time 1501    PT Stop Time 1062    PT Time Calculation (min) 43 min    Activity Tolerance Patient limited by pain    Behavior During Therapy [facility] for tasks assessed/performed             Past Medical History:  Diagnosis Date   Anemia    Anxiety    Asthma    Breast cancer ([place])    right, 05/13/20 completed Taxol, on Herceptin   Cervical paraspinous muscle spasm    COPD (chronic obstructive pulmonary disease) ([place])    emphysema   Discoid lupus    Discoid lupus    Family history of bladder cancer    Family history of BRCA gene mutation    Family history of breast cancer    Family history of prostate cancer    GERD (gastroesophageal reflux disease)    Hypercholesteremia    Hypothyroidism    Neuropathy    Osteoporosis    Pneumonia    Thyroid disease     Past Surgical History:  Procedure Laterality Date   ABLATION ON ENDOMETRIOSIS  6948   APPLICATION OF A-CELL OF EXTREMITY Right 10/22/2019   Procedure: EXCISION OF RIGHT BREAST WOUND WITH PRIMARY CLOSURE;  Surgeon: [name] Going, DO;  Location: [facility];  Service: Plastics;  Laterality: Right;   BREAST LUMPECTOMY     DEBRIDEMENT AND CLOSURE WOUND Right 10/22/2019   Procedure: Excision of right breast wound;  Surgeon: [name] Going, DO;  Location: [facility];  Service: Plastics;  Laterality: Right;   MASTECTOMY W/ SENTINEL NODE BIOPSY Bilateral 09/05/2019   Procedure: BILATERAL MASTECTOMY WITH RIGHT SENTINEL LYMPH NODE BIOPSY;  Surgeon: Stark Klein, MD;   Location: [facility];  Service: General;  Laterality: Bilateral;  COMBINED WITH REGIONAL FOR POST OP PAIN   PORTACATH PLACEMENT Left 09/05/2019   Procedure: INSERTION PORT-A-CATH WITH ULTRASOUND GUIDANCE;  Surgeon: Stark Klein, MD;  Location: [facility];  Service: General;  Laterality: Left;   TONSILLECTOMY     TUBAL LIGATION     WISDOM TOOTH EXTRACTION      There were no vitals filed for this visit.   Subjective Assessment - 03/14/21 1458     Subjective I saw the oncologist today about the bump on my chest and he is going to schedule an [place], and he XRayed the left hip. I am going to make an appt with an orthopedist about my right shoulder with [facility] Nakayama. Dr. Marin Olp is going to set up a shoulder MRI. I can't drive with my right hand.    Pertinent History Bil masectomy 09/05/19 with 3 lymph nodes removed and negative on the R and benign tisuse on the L. Pt had to ago for another debridement on August 28 by Dr. Marla Roe due to delayed healing and necrosis. Pt did not have radiation  Patient was diagnosed on 06/09/2019 with right grade III invasive ductal carcinoma breast cancer. It mesures 1.3 cm in the upper outer quadrant with 1.6 cm  of calcifications and DCIS. It is ER positive, PR negative, and HER2 positive with a Ki67 of 50%    Currently in Pain? Yes    Pain Score 7     Pain Location Arm    Pain Orientation Right    Pain Descriptors / Indicators Sharp;Burning;Spasm    Pain Type Chronic pain    Pain Onset More than a month ago    Pain Frequency Constant    Multiple Pain Sites No                               [facility] Adult PT Treatment/Exercise - 03/14/21 0001       Manual Therapy   Soft tissue mobilization In Supine with cocoa butter to Rt pectorals and lats,, then into Lt S/L for work at medial scapular border, UT and levator where pt palpably very tight and tender.    Scapular Mobilization D2 ext with PT resistance 2 x 10    Passive ROM attempted gentle ROM for  flexion, scaption, ER however pt very guarded and painful                          PT Long Term Goals - 02/02/21 1459       PT LONG TERM GOAL #1   Title Pt will improve Rt shoulder flexion to 150 to return to prior motion    Baseline 130 on 02/02/21    Time 8    Period Weeks    Status New      PT LONG TERM GOAL #2   Title Pt will improve Rt shoulder abduction to at least 150 to improve AROM    Baseline 105 on 02/02/21    Time 8    Period Weeks    Status New      PT LONG TERM GOAL #3   Title Pt will report no resting pain in the arm    Time 8    Period Weeks    Status New      PT LONG TERM GOAL #4   Title Pt will improve Rt shoulder strength to at least 4/5 without increased pain    Time 8    Period Weeks    Status New                   Plan - 03/14/21 1548     Clinical Impression Statement Pt saw oncologist who is going to set her up for an US of the nodule on her chest and MRI for her right shoulder.  She continues with significant complaints of pain.  She did very well with resisted scapular d2extension.  Performed soft tissue techniques to the right upper quarter. Significant tightness noted in UT/levator/scapular region, pectorals and lats. Attempted PROM of the right shoulder, however, pt was very guarded today and could not let her arm go. PROM was more limited secondary to pain. Pt will be on hold until after her MRI and results.    Personal Factors and Comorbidities Comorbidity 3+    Comorbidities bil masectomy with 3 lymph node removal on the R, chemotherapy, ongoing infusions    Examination-Activity Limitations Squat;Lift;Caring for Others;Reach Overhead    Examination-Participation Restrictions Occupation;Community Activity    Stability/Clinical Decision Making Stable/Uncomplicated    Rehab Potential Good    PT Frequency 2x / week    PT Duration 4 weeks  PT Treatment/Interventions Therapeutic activities;Therapeutic  exercise;Neuromuscular re-education;Manual techniques;Cryotherapy;Moist Heat;ADLs/Self Care Home Management;Manual lymph drainage;Passive range of motion;Dry needling;Taping;Patient/family education;Functional mobility training    PT Next Visit Plan did pt get MRI and results, [place] of chest nodule, continue D2 scapular extension with manual resistance, STM to right upper quarter, PROM, progress to strength as tolerated    PT Home Exercise Plan Isometrics for Rt shoulder    Consulted and Agree with Plan of Care Patient             Patient will benefit from skilled therapeutic intervention in order to improve the following deficits and impairments:  Decreased knowledge of precautions, Pain, Decreased range of motion, Increased edema  Visit Diagnosis: Abnormal posture  Right shoulder pain, unspecified chronicity  Muscle weakness (generalized)  Aftercare following surgery for neoplasm     Problem List Patient Active Problem List   Diagnosis Date Noted   Pain in right shoulder 03/09/2021   Polyarthralgia 03/09/2021   Neck pain on left side 06/02/2020   Chronic left-sided low back pain with left-sided sciatica 06/02/2020   Paresthesia 05/13/2020   Malignant neoplasm of right female breast ([place]) 05/13/2020   Pulmonary emphysema ([place]) 04/15/2020   Current smoker 04/15/2020   Other allergy status, other than to drugs and biological substances 04/15/2020   Pure hypercholesterolemia 04/15/2020   Skin sensation disturbance 04/15/2020   Spasm 04/15/2020   Osteoporosis 03/12/2020   Acquired absence of breast and absent nipple, bilateral 10/07/2019   Breast wound, right, initial encounter 10/07/2019   Breast cancer of upper-outer quadrant of right female breast ([place]) 09/05/2019   Genetic testing 08/12/2019   Family history of BRCA gene mutation 07/31/2019   Family history of breast cancer    Family history of prostate cancer    Family history of bladder cancer    Malignant neoplasm of  upper-outer quadrant of right breast in female, estrogen receptor positive ([place]) 07/24/2019   Breast cancer ([place]) 07/18/2019   Annual physical exam 03/31/2016   Occupational bronchitis ([place]) 03/03/2015   Hyperlipidemia 03/30/2014   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 Did not achieve goals   Education / Equipment: HEP   Patient agrees to discharge. Patient goals were  not fully assessed but was continuing with pain and decreased function . Patient is being discharged due to  lack of progress and needing to follow up with physician.Claris Pong, PT 03/14/2021, 3:56 PM  [facility] @ [address] [place], [place], [address] Phone: [phone]   Fax:  [phone]  Name: Patricie Geeslin MRN: [id number] Date of Birth: [birth year]

## 2021-03-14 NOTE — Progress Notes (Signed)
Hematology and Oncology Follow Up Visit  Stephanie Chase [id number] [birth year] 59 y.o. 03/14/2021   Principle Diagnosis:  Stage IA (T1cN0M0) infiltrating ductal carcinoma of the RIGHT breast -- ER+/PR-/HER2+ Osteoporosis-secondary to chemotherapy  Current Therapy:   S/P bilateral mastectomy -- June 2021 S/p Taxol/Herceptin -- completed 12 weeks on 01/26/2020 Herceptin -- maintenance - start 02/10/2020 -- completed on 10/25/2020 Prolia 60 mg subcu q. 6 months- next dose in 01/2021     Interim History:  Stephanie Chase is back for follow-up.  She is still having a lot of problems with pain.  Unfortunately, her mom passed away up in [place] in November.  This was November 15.  I know this is been very tough for Stephanie Chase.  This is been hard on her.  She still is complaining of a lot of pain with the right arm and shoulder.  She has seen a rheumatologist.  She is still very leery about starting Lyrica.  She is just worried about the side effects.  She is having problems with her left hip.  We did do some x-rays of the left hip today.  There is nothing there that looks like metastatic disease or arthritis.  She is worried about a nodule on the right anterior chest wall where she had her mastectomy.  When I examined this, there was a nodule that measured about 3 x 3 mm.  It is firm.  It is not mobile.  I do not know if this might be granulation tissue from the underlying suture.  We will see if a ultrasound can help [place] out.  If this does not help, we will have to see if this nodule can be resected.  We will have to see about an MRI of the right shoulder to see if there is any kind of rotator cuff issue that is causing the discomfort.  She is just not able to work.  She has no energy.  She really cannot use her right arm.  She is going had to be out of work for a lot longer than April 03, 2021.  She is not looking forward to Christmas.  She will celebrate Christmas with her family but  just has no energy.  She try to do some Christmas baking but was just in too much pain and fatigue to do this.  I would have to say that her performance status is probably ECOG 2.      Medications:  Current Outpatient Medications:    acetaminophen (TYLENOL) 500 MG tablet, Take 500-1,000 mg by mouth every 6 (six) hours as needed for moderate pain. , Disp: , Rfl:    albuterol (VENTOLIN HFA) 108 (90 Base) MCG/ACT inhaler, Inhale 2 puffs into the lungs every 6 (six) hours as needed for wheezing or shortness of breath., Disp: 8 g, Rfl: 2   ALPRAZolam (XANAX) 0.25 MG tablet, TAKE 1 TABLET BY MOUTH DAILY AS NEEDED FOR ANXIETY, Disp: 30 tablet, Rfl: 0   baclofen (LIORESAL) 10 MG tablet, TAKE 1 TABLET BY MOUTH TWICE A DAY AS NEEDED FOR MUSCLE SPASM, Disp: 30 tablet, Rfl: 3   ergocalciferol (VITAMIN D2) 1.25 MG (50000 UT) capsule, Take 1 capsule (50,000 Units total) by mouth once a week., Disp: 30 capsule, Rfl: 1   fluticasone (FLONASE) 50 MCG/ACT nasal spray, Place 2 sprays into both nostrils in the morning and at bedtime., Disp: 16 g, Rfl: 5   fluticasone (FLOVENT HFA) 110 MCG/ACT inhaler, Inhale 2 puffs into the lungs in the morning  and at bedtime., Disp: 1 each, Rfl: 12   levothyroxine (SYNTHROID) 75 MCG tablet, Take 75 mcg by mouth daily before breakfast., Disp: , Rfl:    liothyronine (CYTOMEL) 5 MCG tablet, TAKE 2 TABLETS (=10MCG     TOTAL)     DAILY, Disp: 180 tablet, Rfl: 1   ondansetron (ZOFRAN) 8 MG tablet, Take 1 tablet (8 mg total) by mouth every 8 (eight) hours as needed. (Patient not taking: Reported on 03/09/2021), Disp: 30 tablet, Rfl: 2   pregabalin (LYRICA) 75 MG capsule, Take 1 capsule (75 mg total) by mouth 2 (two) times daily. (Patient not taking: Reported on 03/09/2021), Disp: 60 capsule, Rfl: 3 No current facility-administered medications for this visit.  Facility-Administered Medications Ordered in Other Visits:    alum & mag hydroxide-simeth (MAALOX/MYLANTA) 200-200-20 MG/5ML  suspension 30 mL, 30 mL, Oral, Once, Tanner, Lyndon Code., PA-C  Allergies:  Allergies  Allergen Reactions   Bactrim [Sulfamethoxazole-Trimethoprim] Itching and Rash    Past Medical History, Surgical history, Social history, and Family History were reviewed and updated.  Review of Systems: Review of Systems  Constitutional:  Positive for fatigue.  HENT:   Positive for hearing loss and tinnitus.   Eyes:  Positive for eye problems.  Respiratory:  Positive for cough.   Cardiovascular: Negative.   Gastrointestinal:  Positive for abdominal pain and nausea.  Endocrine: Negative.   Genitourinary: Negative.    Musculoskeletal:  Positive for arthralgias and myalgias.  Skin: Negative.   Neurological:  Positive for light-headedness.  Hematological: Negative.   Psychiatric/Behavioral: Negative.     Physical Exam:  weight is 174 lb (78.9 kg). Her oral temperature is 98.2 F (36.8 C). Her blood pressure is 125/77 and her pulse is 78. Her respiration is 16 and oxygen saturation is 98%.   Wt Readings from Last 3 Encounters:  03/14/21 174 lb (78.9 kg)  03/09/21 177 lb (80.3 kg)  01/26/21 172 lb 12.8 oz (78.4 kg)    Physical Exam Vitals reviewed.  Constitutional:      Comments: She has had bilateral mastectomies.  Mastectomy scars are well-healed.  There might be some nodularity along the mastectomy site.  Again I have to believe this is scar tissue.  There is no erythema or warmth or swelling associated with these.  I cannot palpate any bilateral axillary lymph nodes.  HENT:     Head: Normocephalic and atraumatic.  Eyes:     Pupils: Pupils are equal, round, and reactive to light.  Cardiovascular:     Rate and Rhythm: Normal rate and regular rhythm.     Heart sounds: Normal heart sounds.  Pulmonary:     Effort: Pulmonary effort is normal.     Breath sounds: Normal breath sounds.  Abdominal:     General: Bowel sounds are normal.     Palpations: Abdomen is soft.  Musculoskeletal:         General: No tenderness or deformity. Normal range of motion.     Cervical back: Normal range of motion.  Lymphadenopathy:     Cervical: No cervical adenopathy.  Skin:    General: Skin is warm and dry.     Findings: No erythema or rash.  Neurological:     Mental Status: She is alert and oriented to person, place, and time.  Psychiatric:        Behavior: Behavior normal.        Thought Content: Thought content normal.        Judgment: Judgment normal.  Lab Results  Component Value Date   WBC 6.0 03/14/2021   HGB 14.6 03/14/2021   HCT 44.4 03/14/2021   MCV 85.9 03/14/2021   PLT 272 03/14/2021     Chemistry      Component Value Date/Time   NA 137 01/26/2021 0928   K 4.2 01/26/2021 0928   CL 102 01/26/2021 0928   CO2 28 01/26/2021 0928   BUN 10 01/26/2021 0928   CREATININE 0.75 01/26/2021 0928   CREATININE 0.66 09/12/2017 0747      Component Value Date/Time   CALCIUM 11.2 (H) 01/26/2021 0928   ALKPHOS 85 01/26/2021 0928   AST 22 01/26/2021 0928   ALT 21 01/26/2021 0928   BILITOT 0.4 01/26/2021 0928      Impression and Plan: Ms. Frentz is is a very nice 61 year old postmenopausal female.  She is originally from [place].  She moved down to [facility].  She has 2 grandchildren already.  Her children live on the [place].  She has early stage breast cancer.  Unfortunately, it is HER-2 positive.  Because this, she required chemotherapy with Herceptin.  Because of the chemotherapy, she has had side effects.  So far, these side effects have not let up.  I am not sure if she will ever have a significant improvement in her quality of life.  We will try to get her back in another month or so and see how she is feeling.  [name], MD 12/19/20229:32 AM

## 2021-03-15 LAB — CANCER ANTIGEN 27.29: CA 27.29: 16 U/mL (ref 0.0–38.6)

## 2021-03-18 LAB — ESTRADIOL, ULTRA SENS: Estradiol, Sensitive: 5.2 pg/mL

## 2021-03-22 ENCOUNTER — Other Ambulatory Visit: Payer: Self-pay

## 2021-03-22 ENCOUNTER — Ambulatory Visit (INDEPENDENT_AMBULATORY_CARE_PROVIDER_SITE_OTHER): Payer: BC Managed Care – PPO

## 2021-03-22 ENCOUNTER — Telehealth: Payer: Self-pay

## 2021-03-22 DIAGNOSIS — M25511 Pain in right shoulder: Secondary | ICD-10-CM | POA: Diagnosis not present

## 2021-03-22 DIAGNOSIS — C50411 Malignant neoplasm of upper-outer quadrant of right female breast: Secondary | ICD-10-CM

## 2021-03-22 DIAGNOSIS — Z170 Estrogen receptor positive status [ER+]: Secondary | ICD-10-CM | POA: Diagnosis not present

## 2021-03-22 IMAGING — MR MR SHOULDER*R* WO/W CM
8 series · 40 of 40 positions shown · IV contrast (gadavist)
Comparison: X-ray shoulder [DATE].

CLINICAL DATA: Severe right shoulder pain for 5 months. Patient
reports rotator cuff injury.

EXAM:
MRI OF THE RIGHT SHOULDER WITHOUT AND WITH CONTRAST
TECHNIQUE: Multiplanar, multisequence MR imaging of the right shoulder was
performed before and after the administration of intravenous
contrast.
CONTRAST:  8mL GADAVIST GADOBUTROL 1 MMOL/ML IV SOLN

[Series 5: T2 fat-sat · axial · 4.0mm · 0.59mm/px · z∈[-32,+51]mm · 5 of 20 slices shown (1 of 3)]
[im 1/20]
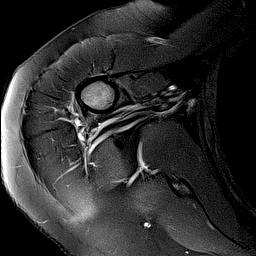
[im 5/20]
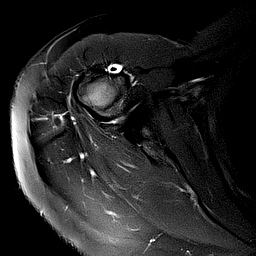
[im 10/20]
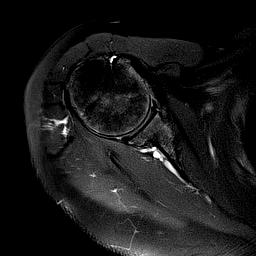
[im 15/20]
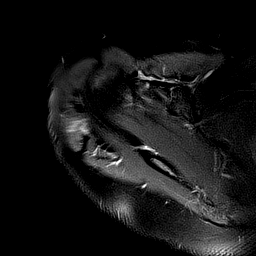
[im 20/20]
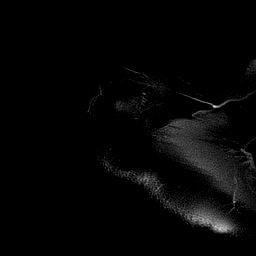

[Series 6: T2 fat-sat · oblique · 4.0mm · 0.62mm/px · 5 of 17 slices shown (2 of 3)]
[im 1/17]
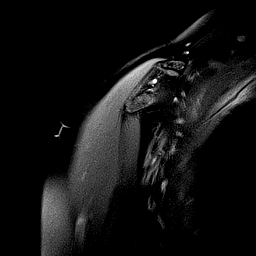
[im 5/17]
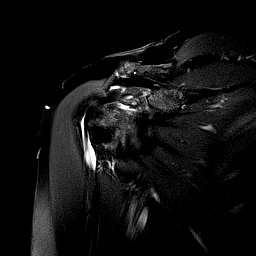
[im 9/17]
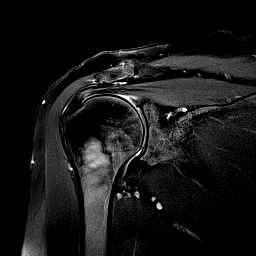
[im 13/17]
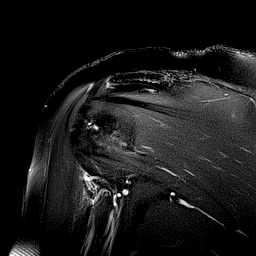
[im 17/17]
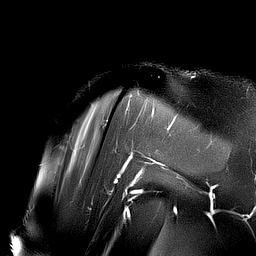

[Series 7: PD · oblique · 4.0mm · 0.62mm/px · 5 of 17 slices shown]
[im 1/17]
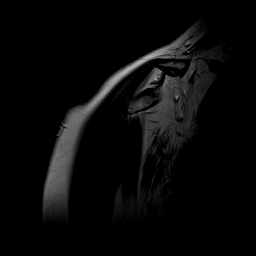
[im 5/17]
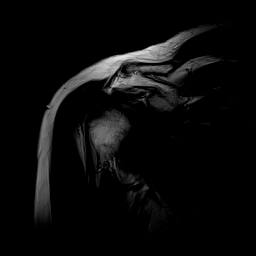
[im 9/17]
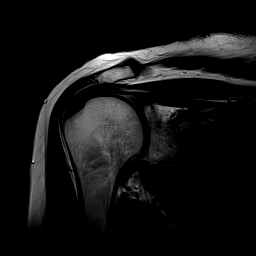
[im 13/17]
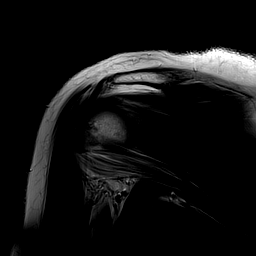
[im 17/17]
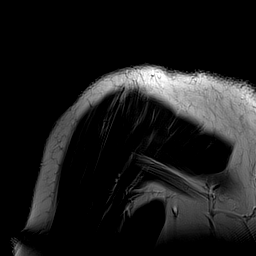

[Series 8: T1 · oblique · 4.0mm · 0.55mm/px · 5 of 20 slices shown]
[im 1/20]
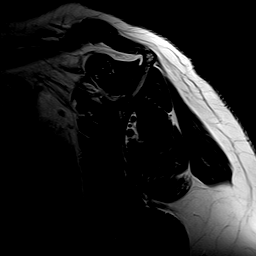
[im 5/20]
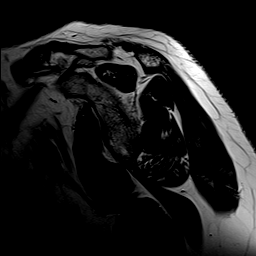
[im 10/20]
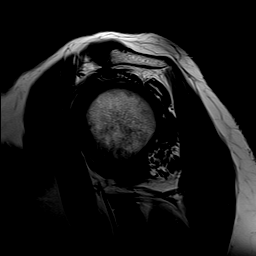
[im 15/20]
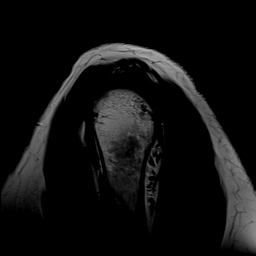
[im 20/20]
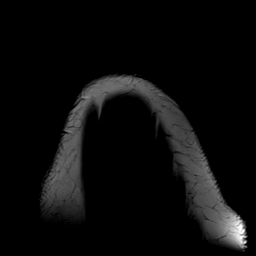

[Series 9: T2 fat-sat · oblique · 4.0mm · 0.55mm/px · 5 of 20 slices shown (3 of 3)]
[im 1/20]
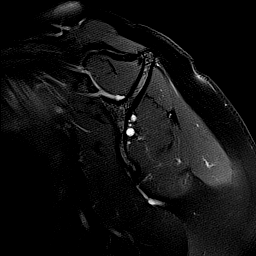
[im 5/20]
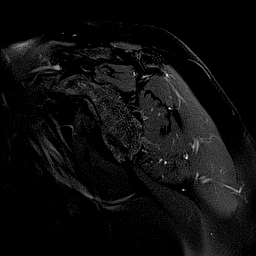
[im 10/20]
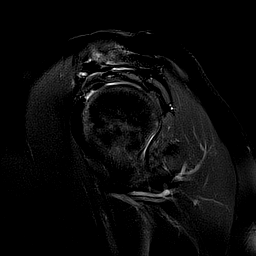
[im 15/20]
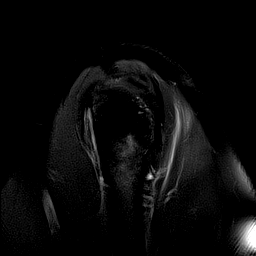
[im 20/20]
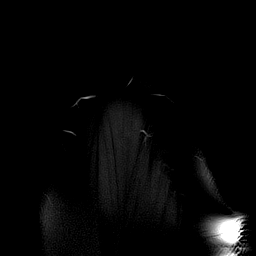

[Series 10: T1 fat-sat · axial · non-contrast · 4.0mm · 0.55mm/px · z∈[-31,+50]mm · 5 of 20 slices shown]
[im 1/20]
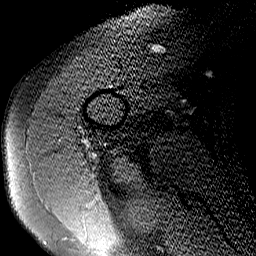
[im 5/20]
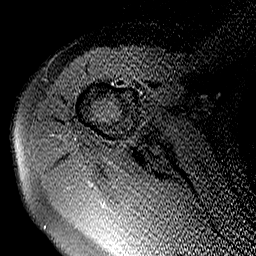
[im 10/20]
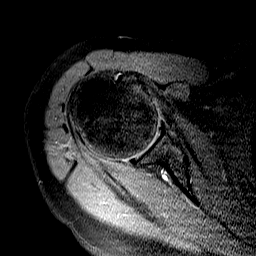
[im 15/20]
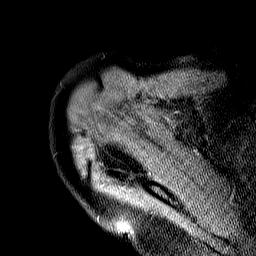
[im 20/20]
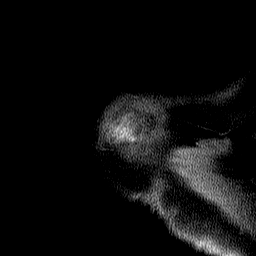

[Series 11: T1 fat-sat post-contrast · axial · 4.0mm · 0.55mm/px · z∈[-31,+50]mm · 5 of 20 slices shown (1 of 2)]
[im 1/20]
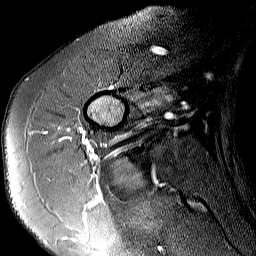
[im 5/20]
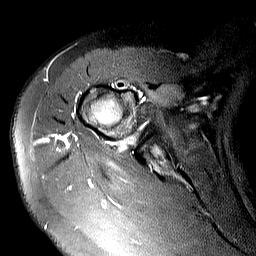
[im 10/20]
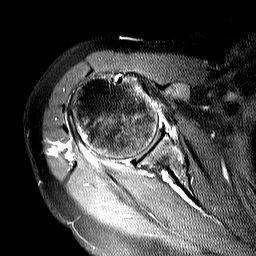
[im 15/20]
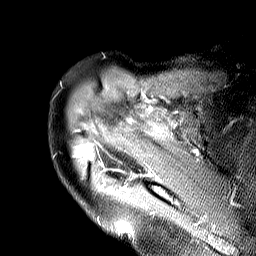
[im 20/20]
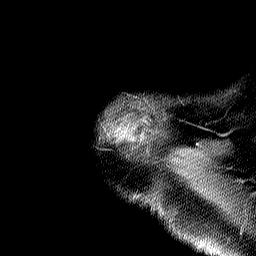

[Series 12: T1 fat-sat post-contrast · oblique · 4.0mm · 0.50mm/px · 5 of 17 slices shown (2 of 2)]
[im 1/17]
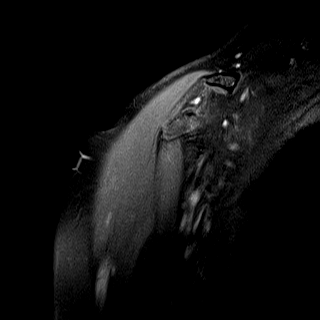
[im 5/17]
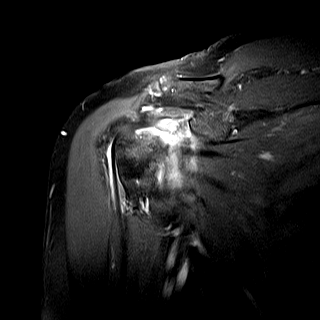
[im 9/17]
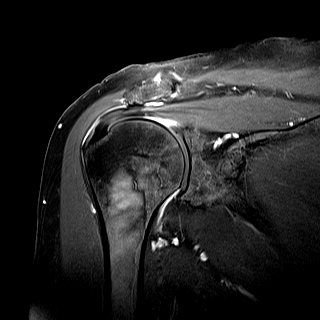
[im 13/17]
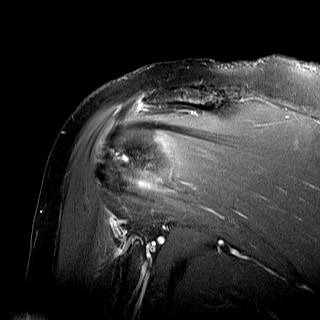
[im 17/17]
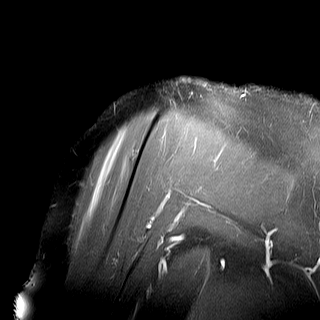

[40 of 40 positions shown; findings below may reference images not displayed]

FINDINGS: Rotator cuff: There is tendinosis of the distal supraspinatus and
infraspinatus tendons with bursal sided fraying. Intact teres minor.
Intact subscapularis tendon.

Muscles: Streaky intramuscular edema within the mid fibers of the
deltoid muscle. No deltoid atrophy. No supraspinatus, infraspinatus,
or subscapularis atrophy. Isolated, grade [DATE] teres minor atrophy

Biceps Long Head: Intraarticular and extraarticular portions of the
biceps tendon are intact.

Acromioclavicular Joint: Mild arthropathy of the acromioclavicular
joint. Small amount of subacromial/subdeltoid bursal fluid.

Glenohumeral Joint: No joint effusion. Mild chondrosis.

Labrum: Degenerative superior labral tearing anteriorly and
posteriorly.

Bones: Marrow conversion changes or serous atrophy noted in the
proximal humerus. No focal bone lesion. No acute fracture or
dislocation.

Other: No fluid collection or hematoma. No enhancing soft tissue
mass.
IMPRESSION: 1. Distal supraspinatus and infraspinatus tendinosis with bursal
sided fraying. No high-grade or retracted cuff tear.
2. Mild subacromial-subdeltoid bursitis.
3. Degenerative superior labral tearing anteriorly and posteriorly.
4. Mild AC joint arthropathy.
5. Streaky intramuscular edema within the deltoid mid fibers
consistent with low-grade muscle strain.

## 2021-03-22 MED ORDER — GADOBUTROL 1 MMOL/ML IV SOLN
8.0000 mL | Freq: Once | INTRAVENOUS | Status: AC | PRN
  Administered 2021-03-22: 11:00:00 8 mL via INTRAVENOUS

## 2021-03-22 NOTE — Telephone Encounter (Signed)
Called and informed patient of MRI results, she verbalized understanding and denies any questions or concerns at this time.

## 2021-03-22 NOTE — Telephone Encounter (Signed)
-----   Message from [name], MD sent at 03/22/2021  1:54 PM EST ----- Call - MRI with NO tears but a lot of inflammation!!!!  Dr. Rhona Raider can take care of this !!  [name]

## 2021-03-25 ENCOUNTER — Ambulatory Visit: Payer: BC Managed Care – PPO | Admitting: Internal Medicine

## 2021-03-30 ENCOUNTER — Other Ambulatory Visit: Payer: Self-pay | Admitting: Family

## 2021-03-30 ENCOUNTER — Ambulatory Visit: Payer: BC Managed Care – PPO | Admitting: Internal Medicine

## 2021-03-31 ENCOUNTER — Encounter: Payer: Self-pay | Admitting: *Deleted

## 2021-03-31 NOTE — Progress Notes (Unsigned)
LTD papers faxed to [phone] and copy placed in scan bin.

## 2021-04-11 ENCOUNTER — Institutional Professional Consult (permissible substitution): Payer: BC Managed Care – PPO | Admitting: Neurology

## 2021-04-12 DIAGNOSIS — J45909 Unspecified asthma, uncomplicated: Secondary | ICD-10-CM | POA: Insufficient documentation

## 2021-04-12 DIAGNOSIS — F329 Major depressive disorder, single episode, unspecified: Secondary | ICD-10-CM | POA: Insufficient documentation

## 2021-04-12 HISTORY — DX: Major depressive disorder, single episode, unspecified: F32.9

## 2021-04-12 LAB — HM PAP SMEAR

## 2021-04-12 LAB — RESULTS CONSOLE HPV: CHL HPV: NEGATIVE

## 2021-04-14 ENCOUNTER — Ambulatory Visit: Payer: BC Managed Care – PPO | Admitting: Internal Medicine

## 2021-04-18 ENCOUNTER — Inpatient Hospital Stay: Payer: BC Managed Care – PPO | Attending: Hematology

## 2021-04-18 ENCOUNTER — Inpatient Hospital Stay (HOSPITAL_BASED_OUTPATIENT_CLINIC_OR_DEPARTMENT_OTHER): Payer: BC Managed Care – PPO | Admitting: Hematology & Oncology

## 2021-04-18 ENCOUNTER — Other Ambulatory Visit: Payer: BC Managed Care – PPO

## 2021-04-18 ENCOUNTER — Ambulatory Visit: Payer: BC Managed Care – PPO | Admitting: Hematology & Oncology

## 2021-04-18 ENCOUNTER — Other Ambulatory Visit: Payer: Self-pay

## 2021-04-18 ENCOUNTER — Encounter: Payer: Self-pay | Admitting: Hematology & Oncology

## 2021-04-18 VITALS — BP 129/83 | HR 79 | Temp 98.5°F | Resp 18 | Wt 177.0 lb

## 2021-04-18 DIAGNOSIS — Z170 Estrogen receptor positive status [ER+]: Secondary | ICD-10-CM | POA: Insufficient documentation

## 2021-04-18 DIAGNOSIS — M810 Age-related osteoporosis without current pathological fracture: Secondary | ICD-10-CM | POA: Diagnosis not present

## 2021-04-18 DIAGNOSIS — L930 Discoid lupus erythematosus: Secondary | ICD-10-CM

## 2021-04-18 DIAGNOSIS — C50911 Malignant neoplasm of unspecified site of right female breast: Secondary | ICD-10-CM | POA: Diagnosis not present

## 2021-04-18 DIAGNOSIS — E038 Other specified hypothyroidism: Secondary | ICD-10-CM

## 2021-04-18 DIAGNOSIS — R5383 Other fatigue: Secondary | ICD-10-CM | POA: Diagnosis not present

## 2021-04-18 DIAGNOSIS — C50411 Malignant neoplasm of upper-outer quadrant of right female breast: Secondary | ICD-10-CM | POA: Diagnosis present

## 2021-04-18 LAB — CBC WITH DIFFERENTIAL (CANCER CENTER ONLY)
Abs Immature Granulocytes: 0.02 10*3/uL (ref 0.00–0.07)
Basophils Absolute: 0.1 10*3/uL (ref 0.0–0.1)
Basophils Relative: 1 %
Eosinophils Absolute: 0 10*3/uL (ref 0.0–0.5)
Eosinophils Relative: 1 %
HCT: 44.8 % (ref 36.0–46.0)
Hemoglobin: 15.1 g/dL — ABNORMAL HIGH (ref 12.0–15.0)
Immature Granulocytes: 0 %
Lymphocytes Relative: 33 %
Lymphs Abs: 2.1 10*3/uL (ref 0.7–4.0)
MCH: 29 pg (ref 26.0–34.0)
MCHC: 33.7 g/dL (ref 30.0–36.0)
MCV: 86.2 fL (ref 80.0–100.0)
Monocytes Absolute: 0.7 10*3/uL (ref 0.1–1.0)
Monocytes Relative: 10 %
Neutro Abs: 3.6 10*3/uL (ref 1.7–7.7)
Neutrophils Relative %: 55 %
Platelet Count: 279 10*3/uL (ref 150–400)
RBC: 5.2 MIL/uL — ABNORMAL HIGH (ref 3.87–5.11)
RDW: 13.2 % (ref 11.5–15.5)
WBC Count: 6.5 10*3/uL (ref 4.0–10.5)
nRBC: 0 % (ref 0.0–0.2)

## 2021-04-18 LAB — CMP (CANCER CENTER ONLY)
ALT: 11 U/L (ref 0–44)
AST: 17 U/L (ref 15–41)
Albumin: 4.5 g/dL (ref 3.5–5.0)
Alkaline Phosphatase: 91 U/L (ref 38–126)
Anion gap: 6 (ref 5–15)
BUN: 11 mg/dL (ref 6–20)
CO2: 30 mmol/L (ref 22–32)
Calcium: 11.1 mg/dL — ABNORMAL HIGH (ref 8.9–10.3)
Chloride: 102 mmol/L (ref 98–111)
Creatinine: 0.78 mg/dL (ref 0.44–1.00)
GFR, Estimated: >60 mL/min (ref >60)
Glucose, Bld: 96 mg/dL (ref 70–99)
Potassium: 3.7 mmol/L (ref 3.5–5.1)
Sodium: 138 mmol/L (ref 135–145)
Total Bilirubin: 0.4 mg/dL (ref 0.3–1.2)
Total Protein: 7.4 g/dL (ref 6.5–8.1)

## 2021-04-18 NOTE — Progress Notes (Signed)
Hematology and Oncology Follow Up Visit  Stephanie Chase [id number] 1961-02-17 61 y.o. 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 6 months- next dose in 07/2021     Interim History:  Stephanie Chase is back for follow-up.  Unfortunately, looks like she is going need to have surgery for her right shoulder.  She saw Dr. Rhona Raider of Orthopedic Surgery.  I suspect an MRI was done which showed rotator cuff damage.  She is still having issues with her hips and knees.  We have done plain x-rays of these areas.  She is wondering if she needed any additional studies.  I told her that Dr. Rhona Raider, being an orthopedic specialist, could certainly guide [place] in doing additional studies.  She is still having issues with fatigue.  She still has a "brain fog.".  I told her that we could try her on Ritalin.  She is a little bit reluctant to try any new medications.  She still is not on Lyrica.  She is having some problems with her saliva.  She is having no problems swallowing.  I told her to try some Mucinex to see if this may not help a little bit.  She did make it through the Christmas and New Year's holiday.  It was little bit tough given that her mom passed away recently.  She has had no problems with cough.  She has had no rashes.  Para she goes for the ultrasound of her right chest wall today for this nodule.  She is gaining a little bit of weight.  She is not too happy about this.  Overall, I would Say her performance status is probably ECOG 2.     Medications:  Current Outpatient Medications:    acetaminophen (TYLENOL) 500 MG tablet, Take 500-1,000 mg by mouth every 6 (six)  hours as needed for moderate pain. , Disp: , Rfl:    albuterol (VENTOLIN HFA) 108 (90 Base) MCG/ACT inhaler, Inhale 2 puffs into the lungs every 6 (six) hours as needed for wheezing or shortness of breath., Disp: 8 g, Rfl: 2   ALPRAZolam (XANAX) 0.25 MG tablet, TAKE 1 TABLET BY MOUTH DAILY AS NEEDED FOR ANXIETY, Disp: 30 tablet, Rfl: 0   baclofen (LIORESAL) 10 MG tablet, TAKE 1 TABLET BY MOUTH TWICE A DAY AS NEEDED FOR MUSCLE SPASM, Disp: 30 tablet, Rfl: 3   ergocalciferol (VITAMIN D2) 1.25 MG (50000 UT) capsule, Take 1 capsule (50,000 Units total) by mouth once a week., Disp: 30 capsule, Rfl: 1   fluticasone (FLONASE) 50 MCG/ACT nasal spray, Place 2 sprays into both nostrils in the morning and at bedtime., Disp: 16 g, Rfl: 5   fluticasone (FLOVENT HFA) 110 MCG/ACT inhaler, Inhale 2 puffs into the lungs in the morning and at bedtime., Disp: 1 each, Rfl: 12   levothyroxine (SYNTHROID) 75 MCG tablet, Take 75 mcg by mouth daily before breakfast., Disp: , Rfl:    liothyronine (CYTOMEL) 5 MCG tablet, TAKE 2 TABLETS (=10MCG     TOTAL)     DAILY, Disp: 180 tablet, Rfl: 1   ondansetron (ZOFRAN) 8 MG tablet, Take 1 tablet (8 mg total) by mouth every 8 (eight) hours as needed. (Patient not taking: Reported on 03/09/2021), Disp:  30 tablet, Rfl: 2   pregabalin (LYRICA) 75 MG capsule, Take 1 capsule (75 mg total) by mouth 2 (two) times daily. (Patient not taking: Reported on 03/09/2021), Disp: 60 capsule, Rfl: 3   rosuvastatin (CRESTOR) 10 MG tablet, Take 10 mg by mouth daily., Disp: , Rfl:  No current facility-administered medications for this visit.  Facility-Administered Medications Ordered in Other Visits:    alum & mag hydroxide-simeth (MAALOX/MYLANTA) 200-200-20 MG/5ML suspension 30 mL, 30 mL, Oral, Once, Tanner, Lyndon Code., PA-C  Allergies:  Allergies  Allergen Reactions   Bactrim [Sulfamethoxazole-Trimethoprim] Itching and Rash    Past Medical History, Surgical history, Social history, and Family  History were reviewed and updated.  Review of Systems: Review of Systems  Constitutional:  Positive for fatigue.  HENT:   Positive for hearing loss and tinnitus.   Eyes:  Positive for eye problems.  Respiratory:  Positive for cough.   Cardiovascular: Negative.   Gastrointestinal:  Positive for abdominal pain and nausea.  Endocrine: Negative.   Genitourinary: Negative.    Musculoskeletal:  Positive for arthralgias and myalgias.  Skin: Negative.   Neurological:  Positive for light-headedness.  Hematological: Negative.   Psychiatric/Behavioral: Negative.     Physical Exam:  weight is 177 lb (80.3 kg). Her oral temperature is 98.5 F (36.9 C). Her blood pressure is 129/83 and her pulse is 79. Her respiration is 18 and oxygen saturation is 98%.   Wt Readings from Last 3 Encounters:  04/18/21 177 lb (80.3 kg)  03/14/21 174 lb (78.9 kg)  03/09/21 177 lb (80.3 kg)    Physical Exam Vitals reviewed.  Constitutional:      Comments: She has had bilateral mastectomies.  Mastectomy scars are well-healed.  There might be some nodularity along the mastectomy site.  Again I have to believe this is scar tissue.  There is no erythema or warmth or swelling associated with these.  I cannot palpate any bilateral axillary lymph nodes.  HENT:     Head: Normocephalic and atraumatic.  Eyes:     Pupils: Pupils are equal, round, and reactive to light.  Cardiovascular:     Rate and Rhythm: Normal rate and regular rhythm.     Heart sounds: Normal heart sounds.  Pulmonary:     Effort: Pulmonary effort is normal.     Breath sounds: Normal breath sounds.  Abdominal:     General: Bowel sounds are normal.     Palpations: Abdomen is soft.  Musculoskeletal:        General: No tenderness or deformity. Normal range of motion.     Cervical back: Normal range of motion.  Lymphadenopathy:     Cervical: No cervical adenopathy.  Skin:    General: Skin is warm and dry.     Findings: No erythema or rash.   Neurological:     Mental Status: She is alert and oriented to person, place, and time.  Psychiatric:        Behavior: Behavior normal.        Thought Content: Thought content normal.        Judgment: Judgment normal.   Lab Results  Component Value Date   WBC 6.5 04/18/2021   HGB 15.1 (H) 04/18/2021   HCT 44.8 04/18/2021   MCV 86.2 04/18/2021   PLT 279 04/18/2021     Chemistry      Component Value Date/Time   NA 138 04/18/2021 0813   K 3.7 04/18/2021 0813   CL 102 04/18/2021 0813  CO2 30 04/18/2021 0813   BUN 11 04/18/2021 0813   CREATININE 0.78 04/18/2021 0813   CREATININE 0.66 09/12/2017 0747      Component Value Date/Time   CALCIUM 11.1 (H) 04/18/2021 0813   ALKPHOS 91 04/18/2021 0813   AST 17 04/18/2021 0813   ALT 11 04/18/2021 0813   BILITOT 0.4 04/18/2021 0813      Impression and Plan: Ms. Dougher is is a very nice 61 year old postmenopausal female.  She is originally from [place].  She moved down to [facility].  She has 2 grandchildren already.  Her children live on the [place].  She has early stage breast cancer.  Unfortunately, it is HER-2 positive.  Because this, she required chemotherapy with Herceptin.  Because of the chemotherapy, she has had side effects.  So far, these side effects have not let up.  I am not sure if she will ever have a significant improvement in her quality of life.  We are still trying very hard to improve her quality of life.  I think she says she is going to meet virtually with a psychiatrist.  Her family doctor made that recommendation.  I think this is a fantastic recommendation to try to see what how is we might be able to improve her quality of life.  Hopefully, if she has surgery for her right shoulder, should be able to get through this without a lot of complications.  I know I do not think that there should be any problems with her having the breast cancer over on the right side and having a sentinel lymph node  taken out.  We will plan to get her back to see [place] in another 6 weeks or so.   [name], MD 1/23/20239:10 AM

## 2021-04-19 ENCOUNTER — Ambulatory Visit
Admission: RE | Admit: 2021-04-19 | Discharge: 2021-04-19 | Disposition: A | Discharge status: Home / Self Care | Payer: BC Managed Care – PPO | Source: Ambulatory Visit | Attending: Hematology & Oncology | Admitting: Hematology & Oncology

## 2021-04-19 DIAGNOSIS — C50411 Malignant neoplasm of upper-outer quadrant of right female breast: Secondary | ICD-10-CM

## 2021-04-19 LAB — CANCER ANTIGEN 27.29: CA 27.29: 14.8 U/mL (ref 0.0–38.6)

## 2021-04-19 IMAGING — US US BREAST*R* LIMITED INC AXILLA
1 series · 5 of 5 positions shown · non-contrast
Comparison: None.

CLINICAL DATA: Patient with palpable mass within the right
mastectomy site.

EXAM:
ULTRASOUND OF THE RIGHT BREAST

[Series 1: us breast*right* limited inc axilla · 0.06mm/px · 5 of 5 slices shown]
[im 1/5]
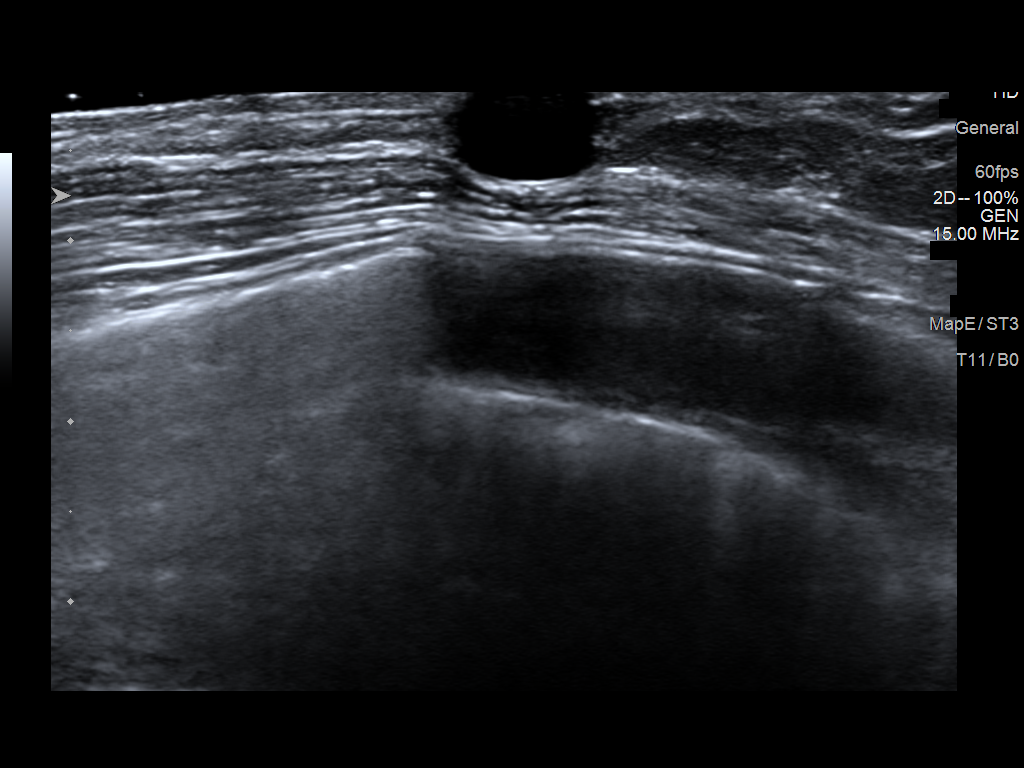
[im 2/5]
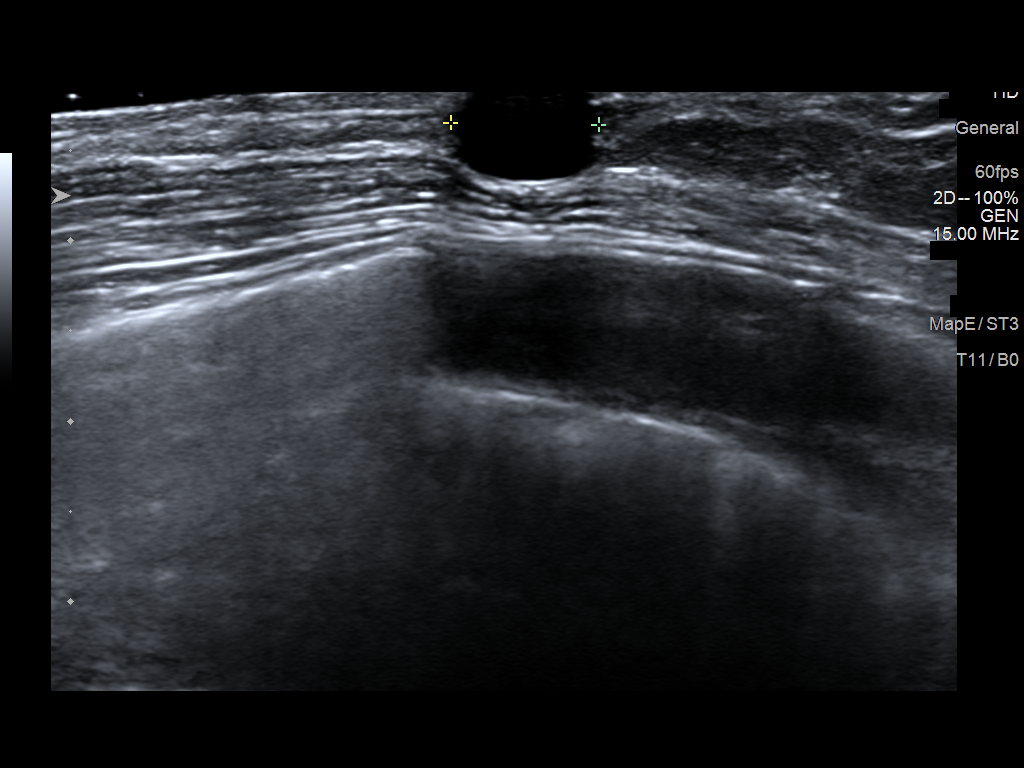
[im 3/5]
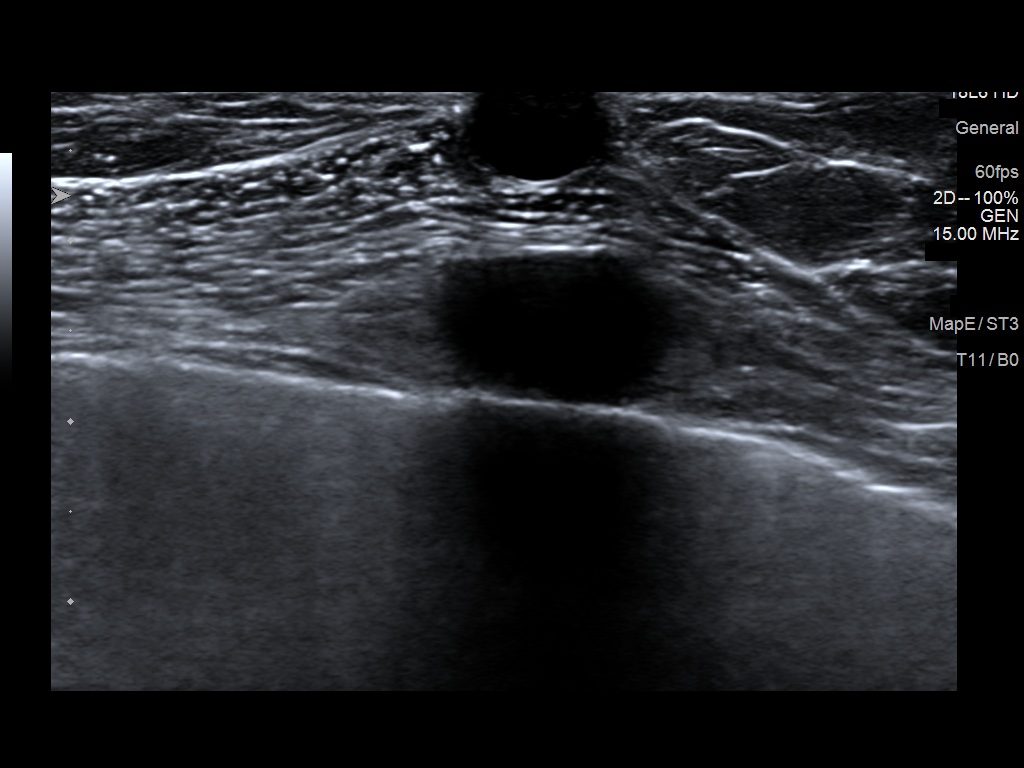
[im 4/5]
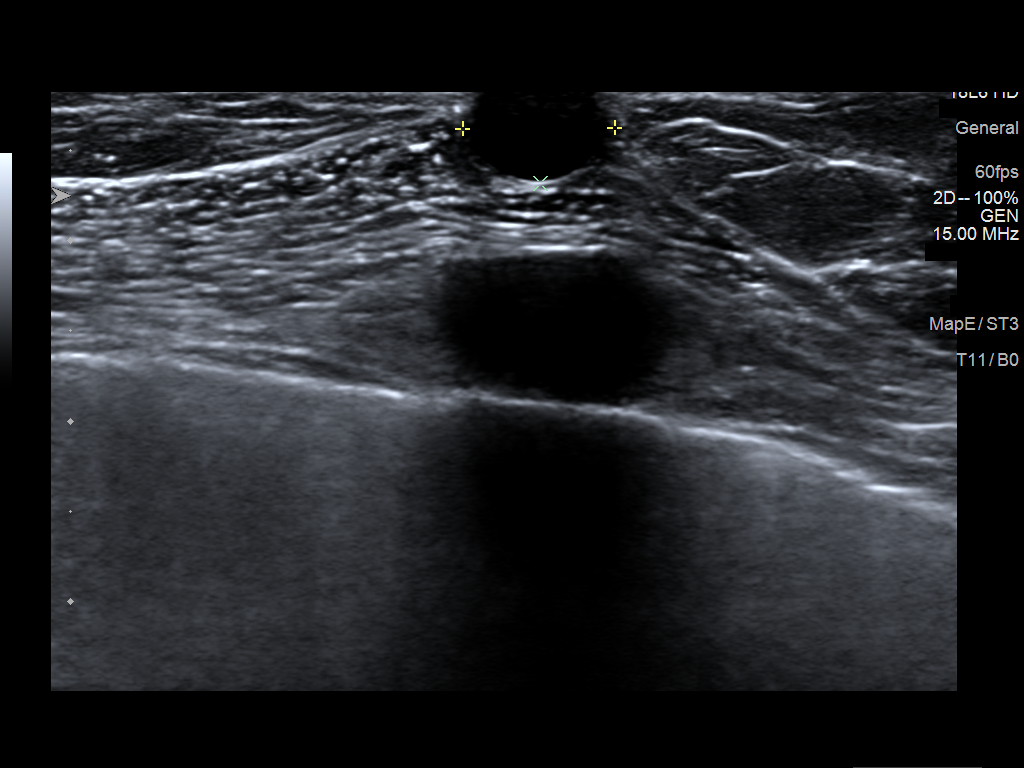
[im 5/5]
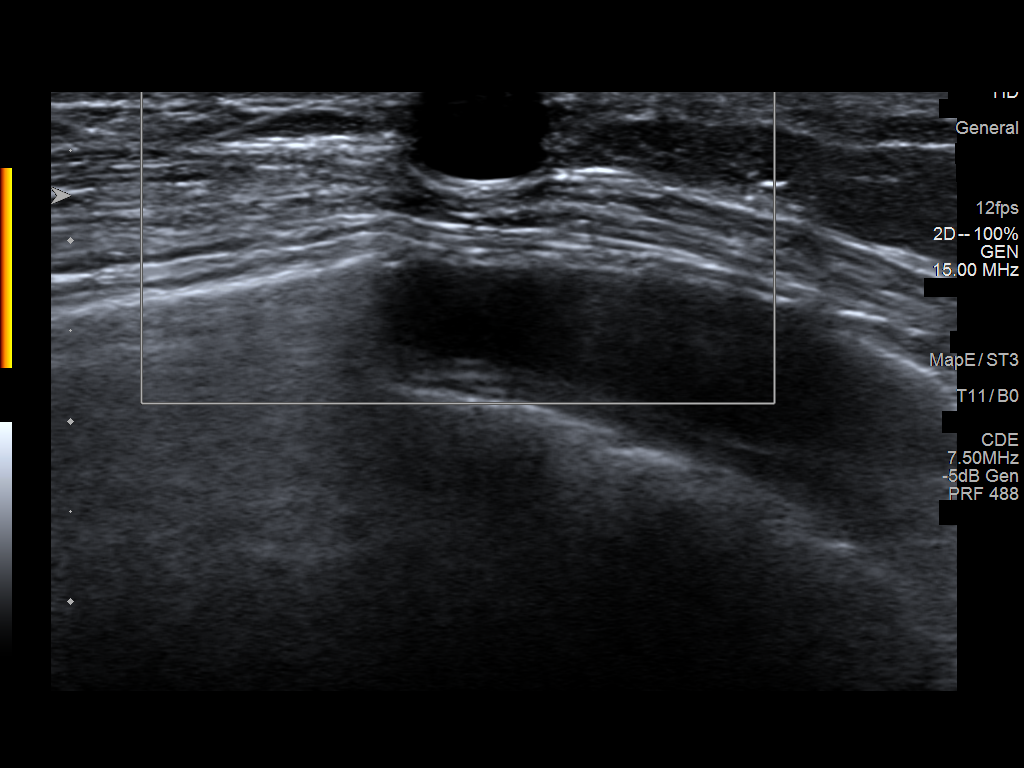

[5 of 5 positions shown; findings below may reference images not displayed]

FINDINGS: On physical exam, there is a small palpable mass at the mastectomy
site.

Targeted ultrasound is performed, showing a 5 x 8 x 6 mm cyst right
mastectomy site. No suspicious mass identified.
IMPRESSION: Palpable mass at the mastectomy site in the right breast corresponds
with a simple cyst.

RECOMMENDATION:
Continued clinical evaluation for palpable mass within the right
mastectomy site, with imaging appearance of a simple cyst.

I have discussed the findings and recommendations with the patient.
If applicable, a reminder letter will be sent to the patient
regarding the next appointment.

BI-RADS CATEGORY  2: Benign.

## 2021-05-16 ENCOUNTER — Other Ambulatory Visit: Payer: Self-pay

## 2021-05-16 ENCOUNTER — Ambulatory Visit
Admission: EM | Admit: 2021-05-16 | Discharge: 2021-05-16 | Disposition: A | Discharge status: Home / Self Care | Payer: BC Managed Care – PPO | Attending: Emergency Medicine | Admitting: Emergency Medicine

## 2021-05-16 DIAGNOSIS — J019 Acute sinusitis, unspecified: Secondary | ICD-10-CM | POA: Diagnosis not present

## 2021-05-16 DIAGNOSIS — J209 Acute bronchitis, unspecified: Secondary | ICD-10-CM | POA: Diagnosis not present

## 2021-05-16 DIAGNOSIS — B9689 Other specified bacterial agents as the cause of diseases classified elsewhere: Secondary | ICD-10-CM

## 2021-05-16 MED ORDER — METHYLPREDNISOLONE 4 MG PO TBPK: ORAL_TABLET | ORAL | 0 refills | Status: DC

## 2021-05-16 MED ORDER — GUAIFENESIN 400 MG PO TABS
ORAL_TABLET | ORAL | 0 refills | Status: DC
Start: 2021-05-16 — End: 2021-05-30

## 2021-05-16 MED ORDER — LEVOFLOXACIN 500 MG PO TABS: 500.0000 mg | ORAL_TABLET | Freq: Every day | ORAL | 0 refills | Status: DC

## 2021-05-16 MED ORDER — IPRATROPIUM BROMIDE 0.06 % NA SOLN
2.0000 | Freq: Four times a day (QID) | NASAL | 0 refills | Status: DC
Start: 2021-05-16 — End: 2023-05-10

## 2021-05-16 NOTE — ED Triage Notes (Signed)
Pt reports sinus pressure, coughing and left ear pain x 2 weeks.

## 2021-05-16 NOTE — Discharge Instructions (Addendum)
Please see the list below for recommended medications, dosages and frequencies to provide relief of your current symptoms:     Levofloxacin 500 mg: Please take 1 tablet daily for the next 10 days.  This antibiotic covers multiple species that commonly infect the sinuses and lower respiratory tract.     Methylprednisolone (Medrol Dosepak): This is a steroid that will significantly calm your upper and lower airways, please take one row of tablets daily with your breakfast meal starting tomorrow morning until the prescription is complete.      Albuterol HFA: This is a bronchodilator, it relaxes the smooth muscles that constrict your airway in your lungs when you are feeling sick or having inflammation secondary to allergies or upper respiratory infection.  Please inhale 2 puffs twice daily every day using the spacer provided.  You can also inhale 2 more puffs as often as needed throughout the day for aggravating cough, chest tightness, feeling short of breath, wheezing.      Ipratropium (Atrovent): This is an excellent nasal decongestant spray that does not cause rebound congestion, can be used up to 4 times daily as needed, instill 2 sprays into each nare with each use.  I have provided you with a prescription for this medication.      Guaifenesin (Robitussin, Mucinex): This is an expectorant.  This helps break up chest congestion and loosen up thick nasal drainage making phlegm and drainage more liquid and therefore easier to remove.  I recommend being 400 mg three times daily as needed.      Conservative care is also recommended at this time.  This includes rest, pushing clear fluids and activity as tolerated.  Warm beverages such as teas and broths versus cold beverages/popsicles and frozen sherbet/sorbet are your choice, both warm and cold are beneficial.  You may also notice that your appetite is reduced; this is okay as long as you are drinking plenty of clear fluids.    Please follow-up within the  next 3 to 5 days either with your primary care provider or urgent care if your symptoms do not resolve.  If you do not have a primary care provider, we will assist you in finding one.

## 2021-05-16 NOTE — ED Provider Notes (Signed)
[facility]    CSN: [id number] Arrival date & time: 05/16/21  1324    HISTORY  No chief complaint on file.  HPI Stephanie Chase is a 61 y.o. female. Patient reports a 2-week history of sinus pressure, coughing and left ear pain.  Patient states she has been provided with a prescription for azithromycin which she did not help.  Patient states her cough is gotten consistently worse over the past 2 weeks, states she also feels short of breath at times which is new.  Patient reports a history of asthma and allergies, states she is a current half pack a day smoker.  Patient states she recently visited her young grandson in [place] which is roughly when her symptoms began.  The history is provided by the patient.  Past Medical History:  Diagnosis Date   Anemia    Anxiety    Asthma    Breast cancer ([place])    right, 05/13/20 completed Taxol, on Herceptin   Cervical paraspinous muscle spasm    COPD (chronic obstructive pulmonary disease) ([place])    emphysema   Discoid lupus    Discoid lupus    Family history of bladder cancer    Family history of BRCA gene mutation    Family history of breast cancer    Family history of prostate cancer    GERD (gastroesophageal reflux disease)    Hypercholesteremia    Hypothyroidism    Neuropathy    Osteoporosis    Pneumonia    Thyroid disease    Patient Active Problem List   Diagnosis Date Noted   Pain in right shoulder 03/09/2021   Polyarthralgia 03/09/2021   Neck pain on left side 06/02/2020   Chronic left-sided low back pain with left-sided sciatica 06/02/2020   Paresthesia 05/13/2020   Malignant neoplasm of right female breast ([place]) 05/13/2020   Pulmonary emphysema ([place]) 04/15/2020   Current smoker 04/15/2020   Other allergy status, other than to drugs and biological substances 04/15/2020   Pure hypercholesterolemia 04/15/2020   Skin sensation disturbance 04/15/2020   Spasm 04/15/2020   Osteoporosis 03/12/2020   Acquired  absence of breast and absent nipple, bilateral 10/07/2019   Breast wound, right, initial encounter 10/07/2019   Breast cancer of upper-outer quadrant of right female breast ([place]) 09/05/2019   Genetic testing 08/12/2019   Family history of BRCA gene mutation 07/31/2019   Family history of breast cancer    Family history of prostate cancer    Family history of bladder cancer    Malignant neoplasm of upper-outer quadrant of right breast in female, estrogen receptor positive ([place]) 07/24/2019   Breast cancer ([place]) 07/18/2019   Annual physical exam 03/31/2016   Occupational bronchitis ([place]) 03/03/2015   Hyperlipidemia 03/30/2014   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   Generalized osteoarthritis 02/23/2012   Plantar fasciitis, bilateral 02/23/2012   Vitiligo 02/23/2012   Difficulty hearing 02/13/2012   History of lupus 12/21/2011   Vitamin D deficiency 10/11/2010   Hypothyroidism, unspecified 06/21/2010   Lupus erythematosus 06/14/2010   Past Surgical History:  Procedure Laterality Date   ABLATION ON ENDOMETRIOSIS  3220   APPLICATION OF A-CELL OF EXTREMITY Right 10/22/2019   Procedure: EXCISION OF RIGHT BREAST WOUND WITH PRIMARY CLOSURE;  Surgeon: [name] Going, DO;  Location: [facility];  Service: Plastics;  Laterality: Right;   BREAST LUMPECTOMY     DEBRIDEMENT AND CLOSURE WOUND Right 10/22/2019   Procedure: Excision of right breast wound;  Surgeon: [name] Going, DO;  Location:  [facility];  Service: Plastics;  Laterality: Right;   MASTECTOMY W/ SENTINEL NODE BIOPSY Bilateral 09/05/2019   Procedure: BILATERAL MASTECTOMY WITH RIGHT SENTINEL LYMPH NODE BIOPSY;  Surgeon: Stark Klein, MD;  Location: [facility];  Service: General;  Laterality: Bilateral;  COMBINED WITH REGIONAL FOR POST OP PAIN   PORTACATH PLACEMENT Left 09/05/2019   Procedure: INSERTION PORT-A-CATH WITH ULTRASOUND GUIDANCE;  Surgeon: Stark Klein, MD;  Location: [facility];  Service: General;  Laterality: Left;   TONSILLECTOMY     TUBAL  LIGATION     WISDOM TOOTH EXTRACTION     OB History     Gravida  2   Para  2   Term      Preterm      AB      Living         SAB      IAB      Ectopic      Multiple      Live Births             Home Medications    Prior to Admission medications   Medication Sig Start Date End Date Taking? Authorizing Provider  acetaminophen (TYLENOL) 500 MG tablet Take 500-1,000 mg by mouth every 6 (six) hours as needed for moderate pain.     [provider]  albuterol (VENTOLIN HFA) 108 (90 Base) MCG/ACT inhaler Inhale 2 puffs into the lungs every 6 (six) hours as needed for wheezing or shortness of breath. 01/07/20   Truitt Merle, MD  ALPRAZolam Duanne Moron) 0.25 MG tablet TAKE 1 TABLET BY MOUTH DAILY AS NEEDED FOR ANXIETY 02/21/21   [name], MD  baclofen (LIORESAL) 10 MG tablet TAKE 1 TABLET BY MOUTH TWICE A DAY AS NEEDED FOR MUSCLE SPASM 09/09/20   [name], MD  ergocalciferol (VITAMIN D2) 1.25 MG (50000 UT) capsule Take 1 capsule (50,000 Units total) by mouth once a week. 12/10/20   [name], MD  fluticasone (FLONASE) 50 MCG/ACT nasal spray Place 2 sprays into both nostrils in the morning and at bedtime. 11/26/19   Tanner, Lyndon Code., PA-C  fluticasone (FLOVENT HFA) 110 MCG/ACT inhaler Inhale 2 puffs into the lungs in the morning and at bedtime. 01/12/20 12/16/21  Tanner, Lyndon Code., PA-C  levothyroxine (SYNTHROID) 75 MCG tablet Take 75 mcg by mouth daily before breakfast.    [provider]  liothyronine (CYTOMEL) 5 MCG tablet TAKE 2 TABLETS (=10MCG     TOTAL)     DAILY 03/06/18   Silverio Decamp, MD  ondansetron (ZOFRAN) 8 MG tablet Take 1 tablet (8 mg total) by mouth every 8 (eight) hours as needed. Patient not taking: Reported on 03/09/2021 09/01/20   [name], MD  pregabalin (LYRICA) 75 MG capsule Take 1 capsule (75 mg total) by mouth 2 (two) times daily. Patient not taking: Reported on 03/09/2021 12/10/20   [name], MD   rosuvastatin (CRESTOR) 10 MG tablet Take 10 mg by mouth daily. 03/14/21   [provider]   Family History Family History  Problem Relation Age of Onset   Breast cancer Mother 21   Diabetes Mother    Bladder Cancer Mother 5       ureteral cancer   Cancer Mother        blood cancer   Heart attack Father    Hyperlipidemia Father    Hypertension Father    Alzheimer's disease Father    Prostate cancer Father  dx. >50   Cancer Maternal Uncle        prostate or colon   BRCA 1/2 Daughter    Cancer Cousin        unknown type; dx. in her 29s (maternal cousin)   Alzheimer's disease Other    Breast cancer Other 31       maternal great-aunt   Social History Social History   Tobacco Use   Smoking status: Every Day    Packs/day: 0.50    Years: 50.00    Pack years: 25.00    Types: Cigarettes    Start date: 1973   Smokeless tobacco: Never  Vaping Use   Vaping Use: Never used  Substance Use Topics   Alcohol use: Yes    Comment: once a month   Drug use: No   Allergies   Bactrim [sulfamethoxazole-trimethoprim]  Review of Systems Review of Systems Pertinent findings noted in history of present illness.   Physical Exam Triage Vital Signs ED Triage Vitals  Enc Vitals Group     BP 01/21/21 0827 (!) 147/82     Pulse Rate 01/21/21 0827 72     Resp 01/21/21 0827 18     Temp 01/21/21 0827 98.3 F (36.8 C)     Temp Source 01/21/21 0827 Oral     SpO2 01/21/21 0827 98 %     Weight --      Height --      Head Circumference --      Peak Flow --      Pain Score 01/21/21 0826 5     Pain Loc --      Pain Edu? --      Excl. in [place]? --   No data found.  Updated Vital Signs BP 119/72 (BP Location: Left Arm)    Pulse 100    Temp (!) 97.2 F (36.2 C) (Oral)    Resp 16    SpO2 100%   Physical Exam Vitals and nursing note reviewed.  Constitutional:      General: She is not in acute distress.    Appearance: Normal appearance. She is not ill-appearing.  HENT:      Head: Normocephalic and atraumatic.     Salivary Glands: Right salivary gland is not diffusely enlarged or tender. Left salivary gland is not diffusely enlarged or tender.     Right Ear: Tympanic membrane, ear canal and external ear normal. No drainage. No middle ear effusion. There is no impacted cerumen. Tympanic membrane is not erythematous or bulging.     Left Ear: Tympanic membrane, ear canal and external ear normal. No drainage.  No middle ear effusion. There is no impacted cerumen. Tympanic membrane is not erythematous or bulging.     Nose: Nose normal. No nasal deformity, septal deviation, mucosal edema, congestion or rhinorrhea.     Right Turbinates: Not enlarged, swollen or pale.     Left Turbinates: Not enlarged, swollen or pale.     Right Sinus: No maxillary sinus tenderness or frontal sinus tenderness.     Left Sinus: No maxillary sinus tenderness or frontal sinus tenderness.     Mouth/Throat:     Lips: Pink. No lesions.     Mouth: Mucous membranes are moist. No oral lesions.     Pharynx: Oropharynx is clear. Uvula midline. No posterior oropharyngeal erythema or uvula swelling.     Tonsils: No tonsillar exudate. 0 on the right. 0 on the left.  Eyes:     General: Lids  are normal.        Right eye: No discharge.        Left eye: No discharge.     Extraocular Movements: Extraocular movements intact.     Conjunctiva/sclera: Conjunctivae normal.     Right eye: Right conjunctiva is not injected.     Left eye: Left conjunctiva is not injected.  Neck:     Trachea: Trachea and phonation normal.  Cardiovascular:     Rate and Rhythm: Normal rate and regular rhythm.     Pulses: Normal pulses.     Heart sounds: Normal heart sounds. No murmur heard.   No friction rub. No gallop.  Pulmonary:     Effort: Pulmonary effort is normal. No accessory muscle usage, prolonged expiration or respiratory distress.     Breath sounds: No stridor, decreased air movement or transmitted upper airway  sounds. Examination of the right-upper field reveals wheezing. Examination of the left-upper field reveals wheezing. Examination of the right-middle field reveals wheezing. Examination of the left-middle field reveals rales. Examination of the right-lower field reveals decreased breath sounds. Examination of the left-lower field reveals decreased breath sounds. Decreased breath sounds, wheezing and rales present. No rhonchi.  Chest:     Chest wall: No tenderness.  Musculoskeletal:        General: Normal range of motion.     Cervical back: Normal range of motion and neck supple. Normal range of motion.  Lymphadenopathy:     Cervical: No cervical adenopathy.  Skin:    General: Skin is warm and dry.     Findings: No erythema or rash.  Neurological:     General: No focal deficit present.     Mental Status: She is alert and oriented to person, place, and time.  Psychiatric:        Mood and Affect: Mood normal.        Behavior: Behavior normal.    Visual Acuity Right Eye Distance:   Left Eye Distance:   Bilateral Distance:    Right Eye Near:   Left Eye Near:    Bilateral Near:     [facility] Couse / Diagnostics / Procedures:    EKG  Radiology No results found.  Procedures Procedures (including critical care time)  UC Diagnoses / Final Clinical Impressions(s)   I have reviewed the triage vital signs and the nursing notes.  Pertinent labs & imaging results that were available during my care of the patient were reviewed by me and considered in my medical decision making (see chart for details).   Final diagnoses:  Bronchospasm with bronchitis, acute  Acute bacterial sinusitis   Bronchitis versus pneumonia.  Undertreated sinus infection.  Patient provided with prescription for levofloxacin given history of smoking, recommended 10-day course to cover sinus infection as well as respiratory.  Patient provided with Medrol Dosepak to improve work of breathing and encouraged to use albuterol 4  times daily.  Patient also advised to begin guaifenesin to loosen up chest congestion and improve work of breathing.  Return precautions advised  ED Prescriptions     Medication Sig Dispense Auth. Provider   levofloxacin (LEVAQUIN) 500 MG tablet Take 1 tablet (500 mg total) by mouth daily for 10 days. 10 tablet Lynden Oxford Scales, PA-C   methylPREDNISolone (MEDROL DOSEPAK) 4 MG TBPK tablet Take 24 mg on day 1, 20 mg on day 2, 16 mg on day 3, 12 mg on day 4, 8 mg on day 5, 4 mg on day 6. 21 tablet  Lynden Oxford Scales, PA-C   guaifenesin (HUMIBID E) 400 MG TABS tablet Take 1 tablet 3 times daily as needed for chest congestion and cough 21 tablet Lynden Oxford Scales, PA-C   ipratropium (ATROVENT) 0.06 % nasal spray Place 2 sprays into both nostrils 4 (four) times daily. As needed for nasal congestion, runny nose 15 mL Lynden Oxford Scales, PA-C      PDMP not reviewed this encounter.  Pending results:  Labs Reviewed - No data to display  Medications Ordered in UC: Medications - No data to display  Disposition Upon Discharge:  Condition: stable for discharge home Home: take medications as prescribed; routine discharge instructions as discussed; follow up as advised.  Patient presented with an acute illness with associated systemic symptoms and significant discomfort requiring urgent management. In my opinion, this is a condition that a prudent lay person (someone who possesses an average knowledge of health and medicine) may potentially expect to result in complications if not addressed urgently such as respiratory distress, impairment of bodily function or dysfunction of bodily organs.   Routine symptom specific, illness specific and/or disease specific instructions were discussed with the patient and/or caregiver at length.   As such, the patient has been evaluated and assessed, work-up was performed and treatment was provided in alignment with urgent care protocols and evidence  based medicine.  Patient/parent/caregiver has been advised that the patient may require follow up for further testing and treatment if the symptoms continue in spite of treatment, as clinically indicated and appropriate.  If the patient was tested for COVID-19, Influenza and/or RSV, then the patient/parent/guardian was advised to isolate at home pending the results of his/her diagnostic coronavirus test and potentially longer if theyre positive. I have also advised pt that if his/her COVID-19 test returns positive, it's recommended to self-isolate for at least 10 days after symptoms first appeared AND until fever-free for 24 hours without fever reducer AND other symptoms have improved or resolved. Discussed self-isolation recommendations as well as instructions for household member/close contacts as per the [facility] and [place] [facility], and also gave patient the [place] packet with this information.  Patient/parent/caregiver has been advised to return to the [facility] or PCP in 3-5 days if no better; to PCP or the Emergency Department if new signs and symptoms develop, or if the current signs or symptoms continue to change or worsen for further workup, evaluation and treatment as clinically indicated and appropriate  The patient will follow up with their current PCP if and as advised. If the patient does not currently have a PCP we will assist them in obtaining one.   The patient may need specialty follow up if the symptoms continue, in spite of conservative treatment and management, for further workup, evaluation, consultation and treatment as clinically indicated and appropriate.  Patient/parent/caregiver verbalized understanding and agreement of plan as discussed.  All questions were addressed during visit.  Please see discharge instructions below for further details of plan.  Discharge Instructions:   Discharge Instructions      Please see the list below for recommended medications, dosages and frequencies to  provide relief of your current symptoms:     Levofloxacin 500 mg: Please take 1 tablet daily for the next 10 days.  This antibiotic covers multiple species that commonly infect the sinuses and lower respiratory tract.     Methylprednisolone (Medrol Dosepak): This is a steroid that will significantly calm your upper and lower airways, please take one row of tablets daily with your breakfast  meal starting tomorrow morning until the prescription is complete.      Albuterol HFA: This is a bronchodilator, it relaxes the smooth muscles that constrict your airway in your lungs when you are feeling sick or having inflammation secondary to allergies or upper respiratory infection.  Please inhale 2 puffs twice daily every day using the spacer provided.  You can also inhale 2 more puffs as often as needed throughout the day for aggravating cough, chest tightness, feeling short of breath, wheezing.      Ipratropium (Atrovent): This is an excellent nasal decongestant spray that does not cause rebound congestion, can be used up to 4 times daily as needed, instill 2 sprays into each nare with each use.  I have provided you with a prescription for this medication.      Guaifenesin (Robitussin, Mucinex): This is an expectorant.  This helps break up chest congestion and loosen up thick nasal drainage making phlegm and drainage more liquid and therefore easier to remove.  I recommend being 400 mg three times daily as needed.      Conservative care is also recommended at this time.  This includes rest, pushing clear fluids and activity as tolerated.  Warm beverages such as teas and broths versus cold beverages/popsicles and frozen sherbet/sorbet are your choice, both warm and cold are beneficial.  You may also notice that your appetite is reduced; this is okay as long as you are drinking plenty of clear fluids.    Please follow-up within the next 3 to 5 days either with your primary care provider or urgent care if your  symptoms do not resolve.  If you do not have a primary care provider, we will assist you in finding one.     This office note has been dictated using Museum/gallery curator.  Unfortunately, and despite my best efforts, this method of dictation can sometimes lead to occasional typographical or grammatical errors.  I apologize in advance if this occurs.     Lynden Oxford Scales, PA-C 05/16/21 1420

## 2021-05-17 ENCOUNTER — Ambulatory Visit: Payer: BC Managed Care – PPO

## 2021-05-18 ENCOUNTER — Telehealth: Payer: Self-pay

## 2021-05-18 MED ORDER — DOXYCYCLINE HYCLATE 100 MG PO CAPS
100.0000 mg | ORAL_CAPSULE | Freq: Two times a day (BID) | ORAL | 0 refills | Status: AC
Start: 2021-05-18 — End: 2021-05-23

## 2021-05-18 MED ORDER — AMOXICILLIN-POT CLAVULANATE 875-125 MG PO TABS: 1.0000 | ORAL_TABLET | Freq: Two times a day (BID) | ORAL | 0 refills | Status: AC

## 2021-05-18 NOTE — Telephone Encounter (Signed)
Pt called reporting that she feels like the antibiotic prescribed is causing her to have body pain. Provider recommended pt try motrin or Advil for body aches. Patient requesting change in medications. Provider sending new orders out and patient made aware, all questions have been answered.

## 2021-05-30 ENCOUNTER — Encounter: Payer: Self-pay | Admitting: Neurology

## 2021-05-30 ENCOUNTER — Ambulatory Visit (INDEPENDENT_AMBULATORY_CARE_PROVIDER_SITE_OTHER): Payer: BC Managed Care – PPO | Admitting: Neurology

## 2021-05-30 VITALS — BP 126/86 | HR 84 | Ht 65.0 in | Wt 170.0 lb

## 2021-05-30 DIAGNOSIS — R52 Pain, unspecified: Secondary | ICD-10-CM | POA: Insufficient documentation

## 2021-05-30 DIAGNOSIS — R202 Paresthesia of skin: Secondary | ICD-10-CM | POA: Diagnosis not present

## 2021-05-30 HISTORY — DX: Pain, unspecified: R52

## 2021-05-30 MED ORDER — DULOXETINE HCL 60 MG PO CPEP: 60.0000 mg | ORAL_CAPSULE | Freq: Every day | ORAL | 3 refills | Status: DC

## 2021-05-30 MED ORDER — DULOXETINE HCL 30 MG PO CPEP: 30.0000 mg | ORAL_CAPSULE | Freq: Every day | ORAL | 0 refills | Status: DC

## 2021-05-30 NOTE — Progress Notes (Signed)
Chief Complaint  Patient presents with   Follow-up    Room 15, alone NX Stephanie Chase 2022/Internal referral for neuropathy C/o of burning in both feet, pain in neck, left shoulder and lower back , radiating to hip and leg on left side       ASSESSMENT AND PLAN  Stephanie Chase is a 61 y.o. 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   MEDICAL HISTORY:  Stephanie Chase, a 61 year old female, seen in request by her oncologist Dr. Marin Chase, Stephanie Chase for evaluation of bilateral lower extremity paresthesia, diffuse body achy pain, her primary care physician is Dr. Theda Chase, Stephanie Chase.   I reviewed and summarized the referring note. PMHX Hypothyrodism Asthma Right Breast cancer, s/p lumpectomy followed by chemotherapy. Discoid lupus  She had a history of infiltrating ductal carcinoma of right breast, status bilateral mastectomy in June 2021 followed by Taxol/Herceptin chemotherapy 12 weeks completed January 26, 2020, followed by Herceptin maintenance dosage completed on October 25, 2020  Shortly after starting chemotherapy, she noticed bilateral feet numbness tingling, which has been persistent since then, I saw her in March 2022 for worsening left hand symptoms, also complains of tension at her left shoulder, elbow  EMG nerve conduction study that showed no large fiber peripheral neuropathy  She was given low-dose of Cymbalta 20 mg daily, she tolerated well, no benefit noted, she stopped taking it  Today she also complains of depression, tearful,  she continued complaints of bilateral feet paresthesia, hard to be active, brain fog, frequent headaches, with light noise sensitivity,   Personally reviewed MRI of brain with without contrast in February 2022 there was no significant abnormality Recent MRI of the shoulder showed rotator cuff, pending right arthroscopic surgery  1. Distal supraspinatus and infraspinatus tendinosis with bursal sided fraying. No high-grade or retracted cuff tear. 2. Mild subacromial-subdeltoid bursitis. 3. Degenerative superior labral tearing anteriorly and posteriorly. 4. Mild AC joint arthropathy. 5. Streaky intramuscular edema within the deltoid mid fibers consistent with low-grade muscle strain.  I personally reviewed MRI of the lumbar in October 2022, mild degenerative changes, no significant canal foraminal narrowing   PHYSICAL EXAM:   Vitals:   05/30/21 0830  BP: 126/86  Pulse: 84  Weight: 170 lb (77.1 kg)  Height: '5\' 5"'  (1.651 m)   Not recorded     Body mass index is 28.29 kg/m.  PHYSICAL EXAMNIATION:  Gen: NAD, conversant, well nourised, well groomed                     Cardiovascular: Regular rate rhythm, no peripheral edema, warm, nontender. Eyes: Conjunctivae clear without exudates or hemorrhage Neck: Supple, no carotid bruits. Pulmonary: Clear to auscultation bilaterally   NEUROLOGICAL EXAM:  MENTAL STATUS: Speech/cognition: Depressed looking middle-aged female, alert, oriented to history taking and casual conversation   CRANIAL NERVES: CN II: Visual fields are full to confrontation. Pupils are round equal and briskly reactive to light. CN III, IV, VI: extraocular movement are normal. No ptosis. CN V: Facial sensation is intact to light touch CN VII: Face is symmetric  with normal eye closure  CN VIII: Hearing is normal to causal conversation. CN IX, X: Phonation is normal. CN XI: Head turning and shoulder shrug are intact  MOTOR: There is no pronator drift of  out-stretched arms. Muscle bulk and tone are normal. Muscle strength is normal.  REFLEXES: Reflexes are 2+ and symmetric at the biceps, triceps, knees, and absent at ankles. Plantar responses are flexor.  SENSORY: Mildly length dependent decreased light touch, pinprick to ankle level  COORDINATION: There is no trunk or limb dysmetria noted.  GAIT/STANCE: Posture is normal.  Mildly antalgic  REVIEW OF SYSTEMS:  Full 14 system review of systems performed and notable only for as above All other review of systems were negative.   ALLERGIES: Allergies  Allergen Reactions   Bactrim [Sulfamethoxazole-Trimethoprim] Itching and Rash    HOME MEDICATIONS: Current Outpatient Medications  Medication Sig Dispense Refill   acetaminophen (TYLENOL) 500 MG tablet Take 500-1,000 mg by mouth every 6 (six) hours as needed for moderate pain.      albuterol (VENTOLIN HFA) 108 (90 Base) MCG/ACT inhaler Inhale 2 puffs into the lungs every 6 (six) hours as needed for wheezing or shortness of breath. 8 g 2   ALPRAZolam (XANAX) 0.25 MG tablet TAKE 1 TABLET BY MOUTH DAILY AS NEEDED FOR ANXIETY 30 tablet 0   baclofen (LIORESAL) 10 MG tablet TAKE 1 TABLET BY MOUTH TWICE A DAY AS NEEDED FOR MUSCLE SPASM 30 tablet 3   ergocalciferol (VITAMIN D2) 1.25 MG (50000 UT) capsule Take 1 capsule (50,000 Units total) by mouth once a week. 30 capsule 1   fluticasone (FLONASE) 50 MCG/ACT nasal spray Place 2 sprays into both nostrils in the morning and at bedtime. 16 g 5   fluticasone (FLOVENT HFA) 110 MCG/ACT inhaler Inhale 2 puffs into the lungs in the morning and at bedtime. 1 each 12   ipratropium (ATROVENT) 0.06 % nasal spray Place 2 sprays into both nostrils 4 (four) times daily. As needed for nasal congestion, runny nose 15 mL 0   levothyroxine (SYNTHROID) 75 MCG tablet Take 75 mcg by mouth daily before breakfast.     liothyronine (CYTOMEL) 5 MCG tablet TAKE 2 TABLETS (=10MCG     TOTAL)     DAILY 180 tablet 1   No  current facility-administered medications for this visit.   Facility-Administered Medications Ordered in Other Visits  Medication Dose Route Frequency Provider Last Rate Last Admin   alum & mag hydroxide-simeth (MAALOX/MYLANTA) 200-200-20 MG/5ML suspension 30 mL  30 mL Oral Once Harle Stanford., PA-C        PAST MEDICAL HISTORY: Past Medical History:  Diagnosis Date   Anemia    Anxiety    Asthma    Breast cancer ([place])    right, 05/13/20 completed Taxol, on Herceptin   Cervical paraspinous muscle spasm    COPD (chronic obstructive pulmonary disease) (HCC)    emphysema   Discoid lupus    Discoid lupus    Family history of bladder cancer    Family history of BRCA gene mutation    Family history of breast cancer    Family history of prostate cancer    GERD (gastroesophageal reflux disease)    Hypercholesteremia    Hypothyroidism    Neuropathy    Osteoporosis    Pneumonia    Thyroid disease     PAST SURGICAL HISTORY: Past Surgical History:  Procedure Laterality Date   ABLATION ON ENDOMETRIOSIS  3614   APPLICATION OF A-CELL OF EXTREMITY  Right 10/22/2019   Procedure: EXCISION OF RIGHT BREAST WOUND WITH PRIMARY CLOSURE;  Surgeon: [name] Going, DO;  Location: [place];  Service: Plastics;  Laterality: Right;   BREAST LUMPECTOMY     DEBRIDEMENT AND CLOSURE WOUND Right 10/22/2019   Procedure: Excision of right breast wound;  Surgeon: [name] Going, DO;  Location: [facility];  Service: Plastics;  Laterality: Right;   MASTECTOMY W/ SENTINEL NODE BIOPSY Bilateral 09/05/2019   Procedure: BILATERAL MASTECTOMY WITH RIGHT SENTINEL LYMPH NODE BIOPSY;  Surgeon: Stark Klein, MD;  Location: [facility];  Service: General;  Laterality: Bilateral;  COMBINED WITH REGIONAL FOR POST OP PAIN   PORTACATH PLACEMENT Left 09/05/2019   Procedure: INSERTION PORT-A-CATH WITH ULTRASOUND GUIDANCE;  Surgeon: Stark Klein, MD;  Location: [facility];  Service: General;  Laterality: Left;   TONSILLECTOMY     TUBAL  LIGATION     WISDOM TOOTH EXTRACTION      FAMILY HISTORY: Family History  Problem Relation Age of Onset   Breast cancer Mother 15   Diabetes Mother    Bladder Cancer Mother 88       ureteral cancer   Cancer Mother        blood cancer   Heart attack Father    Hyperlipidemia Father    Hypertension Father    Alzheimer's disease Father    Prostate cancer Father        dx. >50   Cancer Maternal Uncle        prostate or colon   BRCA 1/2 Daughter    Cancer Cousin        unknown type; dx. in her 69s (maternal cousin)   Alzheimer's disease Other    Breast cancer Other 51       maternal great-aunt    SOCIAL HISTORY: Social History   Socioeconomic History   Marital status: Divorced    Spouse name: Not on file   Number of children: 2   Years of education: Not on file   Highest education level: Some college, no degree  Occupational History   Not on file  Tobacco Use   Smoking status: Every Day    Packs/day: 0.50    Years: 50.00    Pack years: 25.00    Types: Cigarettes    Start date: 1973   Smokeless tobacco: Never  Vaping Use   Vaping Use: Never used  Substance and Sexual Activity   Alcohol use: Yes    Comment: once a month   Drug use: No   Sexual activity: Yes    Partners: Male    Birth control/protection: Surgical    Comment: BTL and ablation  Other Topics Concern   Not on file  Social History Narrative   Not on file   Social Determinants of Health   Financial Resource Strain: Not on file  Food Insecurity: Not on file  Transportation Needs: Not on file  Physical Activity: Not on file  Stress: Not on file  Social Connections: Not on file  Intimate Partner Violence: Not on file      Marcial Pacas, M.D. Ph.D.  [facility] [address], [place], [place] [address] Ph: [phone] Fax: [phone]  CC:  [name], MD [address] [place],  [place] [address]  Janie Morning, DO

## 2021-05-31 LAB — CK: Total CK: 56 U/L (ref 32–182)

## 2021-05-31 LAB — THYROID PANEL WITH TSH
Free Thyroxine Index: 1.8 (ref 1.2–4.9)
T3 Uptake Ratio: 25 % (ref 24–39)
T4, Total: 7.1 ug/dL (ref 4.5–12.0)
TSH: 0.487 u[IU]/mL (ref 0.450–4.500)

## 2021-05-31 LAB — HEMOGLOBIN A1C
Est. average glucose Bld gHb Est-mCnc: 120 mg/dL
Hgb A1c MFr Bld: 5.8 % — ABNORMAL HIGH (ref 4.8–5.6)

## 2021-06-01 ENCOUNTER — Ambulatory Visit: Payer: BC Managed Care – PPO | Admitting: Internal Medicine

## 2021-06-01 ENCOUNTER — Inpatient Hospital Stay: Payer: BC Managed Care – PPO | Attending: Hematology

## 2021-06-01 ENCOUNTER — Inpatient Hospital Stay (HOSPITAL_BASED_OUTPATIENT_CLINIC_OR_DEPARTMENT_OTHER): Payer: BC Managed Care – PPO | Admitting: Hematology & Oncology

## 2021-06-01 ENCOUNTER — Encounter: Payer: Self-pay | Admitting: Hematology & Oncology

## 2021-06-01 ENCOUNTER — Other Ambulatory Visit: Payer: Self-pay

## 2021-06-01 VITALS — BP 135/99 | HR 82 | Temp 98.0°F | Resp 18 | Ht 65.0 in | Wt 176.1 lb

## 2021-06-01 DIAGNOSIS — E038 Other specified hypothyroidism: Secondary | ICD-10-CM

## 2021-06-01 DIAGNOSIS — Z170 Estrogen receptor positive status [ER+]: Secondary | ICD-10-CM | POA: Diagnosis not present

## 2021-06-01 DIAGNOSIS — G629 Polyneuropathy, unspecified: Secondary | ICD-10-CM | POA: Insufficient documentation

## 2021-06-01 DIAGNOSIS — C50411 Malignant neoplasm of upper-outer quadrant of right female breast: Secondary | ICD-10-CM | POA: Insufficient documentation

## 2021-06-01 DIAGNOSIS — L930 Discoid lupus erythematosus: Secondary | ICD-10-CM

## 2021-06-01 DIAGNOSIS — Z79899 Other long term (current) drug therapy: Secondary | ICD-10-CM | POA: Diagnosis not present

## 2021-06-01 DIAGNOSIS — M810 Age-related osteoporosis without current pathological fracture: Secondary | ICD-10-CM | POA: Insufficient documentation

## 2021-06-01 LAB — CBC WITH DIFFERENTIAL (CANCER CENTER ONLY)
Abs Immature Granulocytes: 0.02 10*3/uL (ref 0.00–0.07)
Basophils Absolute: 0.1 10*3/uL (ref 0.0–0.1)
Basophils Relative: 1 %
Eosinophils Absolute: 0.1 10*3/uL (ref 0.0–0.5)
Eosinophils Relative: 1 %
HCT: 43.4 % (ref 36.0–46.0)
Hemoglobin: 14.7 g/dL (ref 12.0–15.0)
Immature Granulocytes: 0 %
Lymphocytes Relative: 21 %
Lymphs Abs: 1.7 10*3/uL (ref 0.7–4.0)
MCH: 28.7 pg (ref 26.0–34.0)
MCHC: 33.9 g/dL (ref 30.0–36.0)
MCV: 84.6 fL (ref 80.0–100.0)
Monocytes Absolute: 0.6 10*3/uL (ref 0.1–1.0)
Monocytes Relative: 7 %
Neutro Abs: 5.4 10*3/uL (ref 1.7–7.7)
Neutrophils Relative %: 70 %
Platelet Count: 262 10*3/uL (ref 150–400)
RBC: 5.13 MIL/uL — ABNORMAL HIGH (ref 3.87–5.11)
RDW: 13.1 % (ref 11.5–15.5)
WBC Count: 7.8 10*3/uL (ref 4.0–10.5)
nRBC: 0 % (ref 0.0–0.2)

## 2021-06-01 LAB — IRON AND IRON BINDING CAPACITY (CC-WL,HP ONLY)
Iron: 82 ug/dL (ref 28–170)
Saturation Ratios: 24 % (ref 10.4–31.8)
TIBC: 344 ug/dL (ref 250–450)
UIBC: 262 ug/dL (ref 148–442)

## 2021-06-01 LAB — CMP (CANCER CENTER ONLY)
ALT: 17 U/L (ref 0–44)
AST: 22 U/L (ref 15–41)
Albumin: 4 g/dL (ref 3.5–5.0)
Alkaline Phosphatase: 88 U/L (ref 38–126)
Anion gap: 5 (ref 5–15)
BUN: 11 mg/dL (ref 6–20)
CO2: 29 mmol/L (ref 22–32)
Calcium: 10.5 mg/dL — ABNORMAL HIGH (ref 8.9–10.3)
Chloride: 101 mmol/L (ref 98–111)
Creatinine: 0.74 mg/dL (ref 0.44–1.00)
GFR, Estimated: >60 mL/min (ref >60)
Glucose, Bld: 94 mg/dL (ref 70–99)
Potassium: 4.2 mmol/L (ref 3.5–5.1)
Sodium: 135 mmol/L (ref 135–145)
Total Bilirubin: 0.4 mg/dL (ref 0.3–1.2)
Total Protein: 7 g/dL (ref 6.5–8.1)

## 2021-06-01 LAB — FERRITIN: Ferritin: 50 ng/mL (ref 11–307)

## 2021-06-01 LAB — TSH: TSH: 0.297 u[IU]/mL — ABNORMAL LOW (ref 0.308–3.960)

## 2021-06-01 LAB — VITAMIN B12: Vitamin B-12: 238 pg/mL (ref 180–914)

## 2021-06-01 LAB — SEDIMENTATION RATE: Sed Rate: 42 mm/hr — ABNORMAL HIGH (ref 0–22)

## 2021-06-01 NOTE — Progress Notes (Signed)
Hematology and Oncology Follow Up Visit  Stephanie Chase [id number] Dec 19, 1960 61 y.o. 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 6 months- next dose in 07/2021     Interim History:  Stephanie Chase is back for follow-up.  Sounds like she cannot have surgery for the right shoulder next Friday.  Hopefully, this will help with the shoulder.  She does have some rotator cuff issues.  I am sure that Dr. Rhona Raider will be able to help this.  She did have an ultrasound of the right chest wall area.  There is a cyst that is noted.  There is nothing suspicious for malignancy.  She did see Neurology.  They did make some recommendations for her.  They want her back on Cymbalta.  She still has the "brain fog.".  She still has a tough time finding the right words to say.  She is trying to stay active.  She is trying to walk.  She is worried about the weight gain.  Her weight is holding stable since she was last year.  Again, she is worried about the weight gain.  I told her that we usually do not not deal with weight loss medications.  She has had no fever.  There has been no rashes.  She has had no bleeding.  She is constipated.  She takes an herbal tea to help make her go.  She has not taken this for a little bit.  I suppose we could have her take MiraLAX or lactulose.  Overall, I would say her performance status is probably ECOG 2.      Medications:  Current Outpatient Medications:    acetaminophen (TYLENOL) 500 MG tablet, Take 500-1,000 mg by mouth every 6 (six) hours as needed for moderate pain. , Disp: , Rfl:    albuterol (VENTOLIN HFA) 108 (90 Base) MCG/ACT inhaler, Inhale  2 puffs into the lungs every 6 (six) hours as needed for wheezing or shortness of breath., Disp: 8 g, Rfl: 2   ALPRAZolam (XANAX) 0.25 MG tablet, TAKE 1 TABLET BY MOUTH DAILY AS NEEDED FOR ANXIETY, Disp: 30 tablet, Rfl: 0   baclofen (LIORESAL) 10 MG tablet, TAKE 1 TABLET BY MOUTH TWICE A DAY AS NEEDED FOR MUSCLE SPASM, Disp: 30 tablet, Rfl: 3   DULoxetine (CYMBALTA) 30 MG capsule, Take 1 capsule (30 mg total) by mouth daily., Disp: 30 capsule, Rfl: 0   ergocalciferol (VITAMIN D2) 1.25 MG (50000 UT) capsule, Take 1 capsule (50,000 Units total) by mouth once a week., Disp: 30 capsule, Rfl: 1   fluticasone (FLONASE) 50 MCG/ACT nasal spray, Place 2 sprays into both nostrils in the morning and at bedtime., Disp: 16 g, Rfl: 5   fluticasone (FLOVENT HFA) 110 MCG/ACT inhaler, Inhale 2 puffs into the lungs in the morning and at bedtime., Disp: 1 each, Rfl: 12   ipratropium (ATROVENT) 0.06 % nasal spray, Place 2 sprays into both nostrils 4 (four) times daily. As needed for nasal congestion, runny nose, Disp: 15 mL, Rfl: 0   levothyroxine (SYNTHROID) 75 MCG tablet, Take 75 mcg by mouth daily before breakfast., Disp: , Rfl:    liothyronine (CYTOMEL) 5 MCG tablet, TAKE 2 TABLETS (=10MCG     TOTAL)  DAILY, Disp: 180 tablet, Rfl: 1   DULoxetine (CYMBALTA) 60 MG capsule, Take 1 capsule (60 mg total) by mouth daily. Fill Cymbalta 56m daily first (Patient not taking: Reported on 06/01/2021), Disp: 90 capsule, Rfl: 3 No current facility-administered medications for this visit.  Facility-Administered Medications Ordered in Other Visits:    alum & mag hydroxide-simeth (MAALOX/MYLANTA) 200-200-20 MG/5ML suspension 30 mL, 30 mL, Oral, Once, Tanner, VLyndon Code, PA-C  Allergies:  Allergies  Allergen Reactions   Bactrim [Sulfamethoxazole-Trimethoprim] Itching and Rash    Past Medical History, Surgical history, Social history, and Family History were reviewed and updated.  Review of Systems: Review of Systems   Constitutional:  Positive for fatigue.  HENT:   Positive for hearing loss and tinnitus.   Eyes:  Positive for eye problems.  Respiratory:  Positive for cough.   Cardiovascular: Negative.   Gastrointestinal:  Positive for abdominal pain and nausea.  Endocrine: Negative.   Genitourinary: Negative.    Musculoskeletal:  Positive for arthralgias and myalgias.  Skin: Negative.   Neurological:  Positive for light-headedness.  Hematological: Negative.   Psychiatric/Behavioral: Negative.     Physical Exam:  height is '5\' 5"'  (1.651 m) and weight is 176 lb 1.9 oz (79.9 kg). Her oral temperature is 98 F (36.7 C). Her blood pressure is 135/99 (abnormal) and her pulse is 82. Her respiration is 18 and oxygen saturation is 100%.   Wt Readings from Last 3 Encounters:  06/01/21 176 lb 1.9 oz (79.9 kg)  05/30/21 170 lb (77.1 kg)  04/18/21 177 lb (80.3 kg)    Physical Exam Vitals reviewed.  Constitutional:      Comments: She has had bilateral mastectomies.  Mastectomy scars are well-healed.  There might be some nodularity along the mastectomy site.  Again I have to believe this is scar tissue.  There is no erythema or warmth or swelling associated with these.  I cannot palpate any bilateral axillary lymph nodes.  HENT:     Head: Normocephalic and atraumatic.  Eyes:     Pupils: Pupils are equal, round, and reactive to light.  Cardiovascular:     Rate and Rhythm: Normal rate and regular rhythm.     Heart sounds: Normal heart sounds.  Pulmonary:     Effort: Pulmonary effort is normal.     Breath sounds: Normal breath sounds.  Abdominal:     General: Bowel sounds are normal.     Palpations: Abdomen is soft.  Musculoskeletal:        General: No tenderness or deformity. Normal range of motion.     Cervical back: Normal range of motion.  Lymphadenopathy:     Cervical: No cervical adenopathy.  Skin:    General: Skin is warm and dry.     Findings: No erythema or rash.  Neurological:     Mental  Status: She is alert and oriented to person, place, and time.  Psychiatric:        Behavior: Behavior normal.        Thought Content: Thought content normal.        Judgment: Judgment normal.   Lab Results  Component Value Date   WBC 7.8 06/01/2021   HGB 14.7 06/01/2021   HCT 43.4 06/01/2021   MCV 84.6 06/01/2021   PLT 262 06/01/2021     Chemistry      Component Value Date/Time   NA 138 04/18/2021 0813   K 3.7 04/18/2021 0813   CL 102 04/18/2021 0813   CO2 30  04/18/2021 0813   BUN 11 04/18/2021 0813   CREATININE 0.78 04/18/2021 0813   CREATININE 0.66 09/12/2017 0747      Component Value Date/Time   CALCIUM 11.1 (H) 04/18/2021 0813   ALKPHOS 91 04/18/2021 0813   AST 17 04/18/2021 0813   ALT 11 04/18/2021 0813   BILITOT 0.4 04/18/2021 0813      Impression and Plan: Ms. Touchton is is a very nice 61 year old postmenopausal female.  She is originally from [place].  She moved down to [facility].  She has 2 grandchildren already.  Her children live on the [place].  She has early stage breast cancer.  Unfortunately, it is HER-2 positive.  Because this, she required chemotherapy with Herceptin.  Because of the chemotherapy, she has had side effects.  So far, these side effects have not let up.  I am not sure if she will ever have a significant improvement in her quality of life.  We are still trying very hard to improve her quality of life.  Hopefully, the shoulder surgery will help her quality of life a little bit.  No she will have a tough time for a while until the shoulder recovers from the surgery.  I just hate that she has all this neuropathy in these pains.  We just not been able to find a good way of help in these.  I know she is been on multiple therapies.  I know she is doing her best to help her self.  I just wish that she would be able to get a little better so she can have a better quality of life.  We will go ahead and plan to get her back to see  [place] in about 2 months.  I would like to have her come back long enough after her surgery so when she returns, she will not have too much challenges with coming into see [place].   [name], MD 3/8/20238:56 AM

## 2021-06-09 HISTORY — PX: ROTATOR CUFF REPAIR: SHX139

## 2021-06-22 ENCOUNTER — Other Ambulatory Visit: Payer: Self-pay | Admitting: Neurology

## 2021-06-22 NOTE — Telephone Encounter (Signed)
As per last office visit note "Add on Cymbalta starting 30 mg titrating to 60 mg daily." ? ?Please advise. ?

## 2021-08-02 ENCOUNTER — Inpatient Hospital Stay (HOSPITAL_BASED_OUTPATIENT_CLINIC_OR_DEPARTMENT_OTHER): Payer: BC Managed Care – PPO | Admitting: Hematology & Oncology

## 2021-08-02 ENCOUNTER — Telehealth: Payer: Self-pay | Admitting: *Deleted

## 2021-08-02 ENCOUNTER — Inpatient Hospital Stay: Payer: BC Managed Care – PPO | Attending: Hematology

## 2021-08-02 ENCOUNTER — Encounter: Payer: Self-pay | Admitting: Hematology & Oncology

## 2021-08-02 VITALS — BP 133/90 | HR 92 | Temp 97.6°F | Resp 18 | Ht 65.5 in | Wt 173.2 lb

## 2021-08-02 DIAGNOSIS — C50411 Malignant neoplasm of upper-outer quadrant of right female breast: Secondary | ICD-10-CM | POA: Diagnosis not present

## 2021-08-02 DIAGNOSIS — Z170 Estrogen receptor positive status [ER+]: Secondary | ICD-10-CM | POA: Insufficient documentation

## 2021-08-02 DIAGNOSIS — C50911 Malignant neoplasm of unspecified site of right female breast: Secondary | ICD-10-CM | POA: Insufficient documentation

## 2021-08-02 DIAGNOSIS — Z79899 Other long term (current) drug therapy: Secondary | ICD-10-CM | POA: Diagnosis not present

## 2021-08-02 DIAGNOSIS — E038 Other specified hypothyroidism: Secondary | ICD-10-CM | POA: Diagnosis not present

## 2021-08-02 LAB — SEDIMENTATION RATE: Sed Rate: 24 mm/hr — ABNORMAL HIGH (ref 0–22)

## 2021-08-02 LAB — CBC WITH DIFFERENTIAL (CANCER CENTER ONLY)
Abs Immature Granulocytes: 0.01 10*3/uL (ref 0.00–0.07)
Basophils Absolute: 0.1 10*3/uL (ref 0.0–0.1)
Basophils Relative: 1 %
Eosinophils Absolute: 0.1 10*3/uL (ref 0.0–0.5)
Eosinophils Relative: 2 %
HCT: 45.6 % (ref 36.0–46.0)
Hemoglobin: 15.3 g/dL — ABNORMAL HIGH (ref 12.0–15.0)
Immature Granulocytes: 0 %
Lymphocytes Relative: 25 %
Lymphs Abs: 1.7 10*3/uL (ref 0.7–4.0)
MCH: 28.8 pg (ref 26.0–34.0)
MCHC: 33.6 g/dL (ref 30.0–36.0)
MCV: 85.7 fL (ref 80.0–100.0)
Monocytes Absolute: 0.4 10*3/uL (ref 0.1–1.0)
Monocytes Relative: 7 %
Neutro Abs: 4.4 10*3/uL (ref 1.7–7.7)
Neutrophils Relative %: 65 %
Platelet Count: 276 10*3/uL (ref 150–400)
RBC: 5.32 MIL/uL — ABNORMAL HIGH (ref 3.87–5.11)
RDW: 13.1 % (ref 11.5–15.5)
WBC Count: 6.6 10*3/uL (ref 4.0–10.5)
nRBC: 0 % (ref 0.0–0.2)

## 2021-08-02 LAB — CMP (CANCER CENTER ONLY)
ALT: 8 U/L (ref 0–44)
AST: 14 U/L — ABNORMAL LOW (ref 15–41)
Albumin: 4.4 g/dL (ref 3.5–5.0)
Alkaline Phosphatase: 98 U/L (ref 38–126)
Anion gap: 6 (ref 5–15)
BUN: 12 mg/dL (ref 6–20)
CO2: 28 mmol/L (ref 22–32)
Calcium: 11.1 mg/dL — ABNORMAL HIGH (ref 8.9–10.3)
Chloride: 106 mmol/L (ref 98–111)
Creatinine: 0.74 mg/dL (ref 0.44–1.00)
GFR, Estimated: >60 mL/min (ref >60)
Glucose, Bld: 70 mg/dL (ref 70–99)
Potassium: 4 mmol/L (ref 3.5–5.1)
Sodium: 140 mmol/L (ref 135–145)
Total Bilirubin: 0.4 mg/dL (ref 0.3–1.2)
Total Protein: 7.1 g/dL (ref 6.5–8.1)

## 2021-08-02 LAB — LACTATE DEHYDROGENASE: LDH: 144 U/L (ref 98–192)

## 2021-08-02 NOTE — Telephone Encounter (Signed)
-----   Message from [name], MD sent at 08/02/2021 10:45 AM EDT ----- ?Please call her and let her know that the sedimentation rate is almost back to normal at 24.  Stephanie Chase ?

## 2021-08-02 NOTE — Telephone Encounter (Signed)
As noted below by Dr. Marin Olp, I informed the patient that the sedimentation rate is almost back to normal at 24. She verbalized understanding. ?

## 2021-08-03 LAB — CANCER ANTIGEN 27.29: CA 27.29: 15 U/mL (ref 0.0–38.6)

## 2021-08-09 ENCOUNTER — Encounter: Payer: Self-pay | Admitting: Hematology

## 2021-08-09 ENCOUNTER — Encounter: Payer: Self-pay | Admitting: Hematology & Oncology

## 2021-08-09 NOTE — Progress Notes (Signed)
?Hematology and Oncology Follow Up Visit ? ?Stephanie Chase ?[id number] ?11-25-1960 61 y.o. ?08/09/2021 ? ? 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 ?Current Therapy:   ?S/P bilateral mastectomy -- June 2021 ?S/p Taxol/Herceptin -- completed 12 weeks on 01/26/2020 ?Herceptin -- maintenance - start 02/10/2020 -- completed on 10/25/2020 ?Prolia 60 mg subcu q. 6 months- next dose in 01/2022 ?    ?Interim History:  Stephanie Chase is back for follow-up.  She had a right shoulder surgery.  The session was done about a week ago.  I am less am very impressed with how well she is done.  She does not have to wear a shoulder harness.  I think she is going to start some therapy for this. ? ?She is still worried about a nodule on the right chest wall.  We have gotten scans and ultrasounds of this.  Thankfully, we have not found anything suspicious. ? ?She just is not can be able to work.  She just cannot get through the mental toxicity that she had with the chemotherapy.  She just has very limited short-term memory.  She just cannot focus.  She can sometimes have a hard time finding the right words to say. ? ?She did have a nice Easter.  I am happy about that for her. ? ?It sounds like she will have a relatively quiet Mother's Day weekend. ? ?She has had no bleeding.  There is been no change in bowel bladder habits. ? ?I do think she really has started anything for this neuropathy.  I think she is supposed to be on Cymbalta. ? ?Her appetite seem to be doing okay.  I know she still does not have a lot of stamina. ? ?She has had no fever. ? ?Currently, I had to perform status is probably ECOG 2. ? ? ?Medications:  ?Current Outpatient Medications:  ?  acetaminophen (TYLENOL) 500 MG tablet, Take 500-1,000 mg by mouth every 6 (six) hours as needed for moderate pain. , Disp: , Rfl:  ?  ALPRAZolam (XANAX) 0.25 MG tablet, TAKE 1  TABLET BY MOUTH DAILY AS NEEDED FOR ANXIETY, Disp: 30 tablet, Rfl: 0 ?  baclofen (LIORESAL) 10 MG tablet, TAKE 1 TABLET BY MOUTH TWICE A DAY AS NEEDED FOR MUSCLE SPASM, Disp: 30 tablet, Rfl: 3 ?  DULoxetine (CYMBALTA) 60 MG capsule, Take 1 capsule (60 mg total) by mouth daily. Fill Cymbalta 33m daily first, Disp: 90 capsule, Rfl: 3 ?  ergocalciferol (VITAMIN D2) 1.25 MG (50000 UT) capsule, Take 1 capsule (50,000 Units total) by mouth once a week., Disp: 30 capsule, Rfl: 1 ?  fluticasone (FLONASE) 50 MCG/ACT nasal spray, Place 2 sprays into both nostrils in the morning and at bedtime., Disp: 16 g, Rfl: 5 ?  fluticasone (FLOVENT HFA) 110 MCG/ACT inhaler, Inhale 2 puffs into the lungs in the morning and at bedtime., Disp: 1 each, Rfl: 12 ?  ipratropium (ATROVENT) 0.06 % nasal spray, Place 2 sprays into both nostrils 4 (four) times daily. As needed for nasal congestion, runny nose, Disp: 15 mL, Rfl: 0 ?  levothyroxine (SYNTHROID) 75 MCG tablet, Take 75 mcg by mouth daily before breakfast., Disp: , Rfl:  ?  liothyronine (CYTOMEL) 5 MCG tablet, TAKE 2 TABLETS (=10MCG     TOTAL)     DAILY, Disp: 180 tablet, Rfl: 1 ?  albuterol (VENTOLIN HFA) 108 (90 Base) MCG/ACT inhaler, Inhale 2 puffs into the lungs every  6 (six) hours as needed for wheezing or shortness of breath. (Patient not taking: Reported on 08/02/2021), Disp: 8 g, Rfl: 2 ?  DULoxetine (CYMBALTA) 30 MG capsule, TAKE 1 CAPSULE BY MOUTH EVERY DAY, Disp: 90 capsule, Rfl: 1 ?No current facility-administered medications for this visit. ? ?Facility-Administered Medications Ordered in Other Visits:  ?  alum & mag hydroxide-simeth (MAALOX/MYLANTA) 200-200-20 MG/5ML suspension 30 mL, 30 mL, Oral, Once, Tanner, Lyndon Code., PA-C ? ?Allergies:  ?Allergies  ?Allergen Reactions  ? Bactrim [Sulfamethoxazole-Trimethoprim] Itching and Rash  ? ? ?Past Medical History, Surgical history, Social history, and Family History were reviewed and updated. ? ?Review of Systems: ?Review of Systems   ?Constitutional:  Positive for fatigue.  ?HENT:   Positive for hearing loss and tinnitus.   ?Eyes:  Positive for eye problems.  ?Respiratory:  Positive for cough.   ?Cardiovascular: Negative.   ?Gastrointestinal:  Positive for abdominal pain and nausea.  ?Endocrine: Negative.   ?Genitourinary: Negative.    ?Musculoskeletal:  Positive for arthralgias and myalgias.  ?Skin: Negative.   ?Neurological:  Positive for light-headedness.  ?Hematological: Negative.   ?Psychiatric/Behavioral: Negative.    ? ?Physical Exam: ? height is 5' 5.5" (1.664 m) and weight is 173 lb 4 oz (78.6 kg). Her oral temperature is 97.6 ?F (36.4 ?C). Her blood pressure is 133/90 and her pulse is 92. Her respiration is 18 and oxygen saturation is 100%.  ? ?Wt Readings from Last 3 Encounters:  ?08/02/21 173 lb 4 oz (78.6 kg)  ?06/01/21 176 lb 1.9 oz (79.9 kg)  ?05/30/21 170 lb (77.1 kg)  ? ? ?Physical Exam ?Vitals reviewed.  ?Constitutional:   ?   Comments: She has had bilateral mastectomies.  Mastectomy scars are well-healed.  There might be some nodularity along the mastectomy site.  Again I have to believe this is scar tissue.  There is no erythema or warmth or swelling associated with these.  I cannot palpate any bilateral axillary lymph nodes.  ?HENT:  ?   Head: Normocephalic and atraumatic.  ?Eyes:  ?   Pupils: Pupils are equal, round, and reactive to light.  ?Cardiovascular:  ?   Rate and Rhythm: Normal rate and regular rhythm.  ?   Heart sounds: Normal heart sounds.  ?Pulmonary:  ?   Effort: Pulmonary effort is normal.  ?   Breath sounds: Normal breath sounds.  ?Abdominal:  ?   General: Bowel sounds are normal.  ?   Palpations: Abdomen is soft.  ?Musculoskeletal:     ?   General: No tenderness or deformity. Normal range of motion.  ?   Cervical back: Normal range of motion.  ?Lymphadenopathy:  ?   Cervical: No cervical adenopathy.  ?Skin: ?   General: Skin is warm and dry.  ?   Findings: No erythema or rash.  ?Neurological:  ?   Mental  Status: She is alert and oriented to person, place, and time.  ?Psychiatric:     ?   Behavior: Behavior normal.     ?   Thought Content: Thought content normal.     ?   Judgment: Judgment normal.  ? ?Lab Results  ?Component Value Date  ? WBC 6.6 08/02/2021  ? HGB 15.3 (H) 08/02/2021  ? HCT 45.6 08/02/2021  ? MCV 85.7 08/02/2021  ? PLT 276 08/02/2021  ? ?  Chemistry   ?   ?Component Value Date/Time  ? NA 140 08/02/2021 0900  ? K 4.0 08/02/2021 0900  ? CL 106 08/02/2021  0900  ? CO2 28 08/02/2021 0900  ? BUN 12 08/02/2021 0900  ? CREATININE 0.74 08/02/2021 0900  ? CREATININE 0.66 09/12/2017 0747  ?    ?Component Value Date/Time  ? CALCIUM 11.1 (H) 08/02/2021 0900  ? ALKPHOS 98 08/02/2021 0900  ? AST 14 (L) 08/02/2021 0900  ? ALT 8 08/02/2021 0900  ? BILITOT 0.4 08/02/2021 0900  ?  ? ? ?Impression and Plan: ?Stephanie Chase is is a very nice 46 year old postmenopausal female.  She is originally from [place].  She moved down to [facility]  She has 2 grandchildren already.  Her children live on the [place]. ? ?She has early stage breast cancer.  Unfortunately, it was HER-2 positive.  Because this, she required chemotherapy with Herceptin.  Because of the chemotherapy, she has had side effects.  So far, these side effects have not let up.  I am not sure if she will ever have a significant improvement in her quality of life.  Her change in cognition is really her biggest concern.  She is worried about this deteriorating further and may be developing some dementia. ? ?Again is hard for her to work.  She cannot focus.  She has very little stamina.  She can have a difficult time with communication.  She sometimes has a hard time finding the right word to say. ? ?I told her that she needed to be out on permanent disability, I am sure this could probably be looked into. ? ?I am just glad that she got through the shoulder surgery.  I am very impressed with the toughness that she has had with this. ? ?We will plan  to get her back to see [place] in another couple months. ? ? ?[name], MD ?5/16/20239:24 AM ?

## 2021-08-14 ENCOUNTER — Encounter: Payer: Self-pay | Admitting: Hematology & Oncology

## 2021-08-15 ENCOUNTER — Other Ambulatory Visit: Payer: Self-pay | Admitting: Hematology & Oncology

## 2021-08-15 DIAGNOSIS — C50411 Malignant neoplasm of upper-outer quadrant of right female breast: Secondary | ICD-10-CM

## 2021-08-18 ENCOUNTER — Encounter (HOSPITAL_BASED_OUTPATIENT_CLINIC_OR_DEPARTMENT_OTHER): Payer: Self-pay

## 2021-08-18 ENCOUNTER — Ambulatory Visit (HOSPITAL_BASED_OUTPATIENT_CLINIC_OR_DEPARTMENT_OTHER)
Admission: RE | Admit: 2021-08-18 | Discharge: 2021-08-18 | Disposition: A | Discharge status: Home / Self Care | Payer: BC Managed Care – PPO | Source: Ambulatory Visit | Attending: Hematology & Oncology | Admitting: Hematology & Oncology

## 2021-08-18 DIAGNOSIS — C50411 Malignant neoplasm of upper-outer quadrant of right female breast: Secondary | ICD-10-CM | POA: Diagnosis present

## 2021-08-18 MED ORDER — IOHEXOL 300 MG/ML  SOLN
100.0000 mL | Freq: Once | INTRAMUSCULAR | Status: AC | PRN
  Administered 2021-08-18: 75 mL via INTRAVENOUS

## 2021-08-19 ENCOUNTER — Encounter: Payer: Self-pay | Admitting: *Deleted

## 2021-09-12 ENCOUNTER — Encounter: Payer: Self-pay | Admitting: Hematology

## 2021-09-12 ENCOUNTER — Encounter: Payer: Self-pay | Admitting: Hematology & Oncology

## 2021-09-12 DIAGNOSIS — Z0271 Encounter for disability determination: Secondary | ICD-10-CM

## 2021-09-15 ENCOUNTER — Encounter: Payer: Self-pay | Admitting: Hematology & Oncology

## 2021-09-15 ENCOUNTER — Encounter: Payer: Self-pay | Admitting: Hematology

## 2021-09-19 ENCOUNTER — Inpatient Hospital Stay: Payer: BC Managed Care – PPO | Admitting: Hematology & Oncology

## 2021-09-19 ENCOUNTER — Inpatient Hospital Stay: Payer: BC Managed Care – PPO

## 2021-10-17 ENCOUNTER — Other Ambulatory Visit: Payer: Self-pay

## 2021-10-21 ENCOUNTER — Inpatient Hospital Stay: Payer: BC Managed Care – PPO | Attending: Hematology

## 2021-10-21 ENCOUNTER — Other Ambulatory Visit: Payer: Self-pay | Admitting: Hematology & Oncology

## 2021-10-21 ENCOUNTER — Encounter: Payer: Self-pay | Admitting: Hematology & Oncology

## 2021-10-21 ENCOUNTER — Encounter: Payer: Self-pay | Admitting: Hematology

## 2021-10-21 ENCOUNTER — Other Ambulatory Visit: Payer: Self-pay

## 2021-10-21 ENCOUNTER — Inpatient Hospital Stay (HOSPITAL_BASED_OUTPATIENT_CLINIC_OR_DEPARTMENT_OTHER): Payer: BC Managed Care – PPO | Admitting: Hematology & Oncology

## 2021-10-21 VITALS — BP 110/84 | HR 84 | Temp 98.2°F | Resp 18 | Ht 65.0 in | Wt 172.4 lb

## 2021-10-21 DIAGNOSIS — C50911 Malignant neoplasm of unspecified site of right female breast: Secondary | ICD-10-CM | POA: Insufficient documentation

## 2021-10-21 DIAGNOSIS — C50411 Malignant neoplasm of upper-outer quadrant of right female breast: Secondary | ICD-10-CM

## 2021-10-21 DIAGNOSIS — M818 Other osteoporosis without current pathological fracture: Secondary | ICD-10-CM | POA: Diagnosis not present

## 2021-10-21 DIAGNOSIS — Z170 Estrogen receptor positive status [ER+]: Secondary | ICD-10-CM | POA: Insufficient documentation

## 2021-10-21 DIAGNOSIS — E038 Other specified hypothyroidism: Secondary | ICD-10-CM

## 2021-10-21 LAB — CMP (CANCER CENTER ONLY)
ALT: 10 U/L (ref 0–44)
AST: 13 U/L — ABNORMAL LOW (ref 15–41)
Albumin: 4.6 g/dL (ref 3.5–5.0)
Alkaline Phosphatase: 113 U/L (ref 38–126)
Anion gap: 8 (ref 5–15)
BUN: 16 mg/dL (ref 6–20)
CO2: 26 mmol/L (ref 22–32)
Calcium: 10.9 mg/dL — ABNORMAL HIGH (ref 8.9–10.3)
Chloride: 101 mmol/L (ref 98–111)
Creatinine: 0.85 mg/dL (ref 0.44–1.00)
GFR, Estimated: >60 mL/min (ref >60)
Glucose, Bld: 105 mg/dL — ABNORMAL HIGH (ref 70–99)
Potassium: 4.3 mmol/L (ref 3.5–5.1)
Sodium: 135 mmol/L (ref 135–145)
Total Bilirubin: 0.3 mg/dL (ref 0.3–1.2)
Total Protein: 7.4 g/dL (ref 6.5–8.1)

## 2021-10-21 LAB — CBC WITH DIFFERENTIAL (CANCER CENTER ONLY)
Abs Immature Granulocytes: 0.13 10*3/uL — ABNORMAL HIGH (ref 0.00–0.07)
Basophils Absolute: 0 10*3/uL (ref 0.0–0.1)
Basophils Relative: 0 %
Eosinophils Absolute: 0 10*3/uL (ref 0.0–0.5)
Eosinophils Relative: 0 %
HCT: 44.2 % (ref 36.0–46.0)
Hemoglobin: 14.9 g/dL (ref 12.0–15.0)
Immature Granulocytes: 1 %
Lymphocytes Relative: 14 %
Lymphs Abs: 2.1 10*3/uL (ref 0.7–4.0)
MCH: 28.5 pg (ref 26.0–34.0)
MCHC: 33.7 g/dL (ref 30.0–36.0)
MCV: 84.7 fL (ref 80.0–100.0)
Monocytes Absolute: 1.2 10*3/uL — ABNORMAL HIGH (ref 0.1–1.0)
Monocytes Relative: 8 %
Neutro Abs: 11.3 10*3/uL — ABNORMAL HIGH (ref 1.7–7.7)
Neutrophils Relative %: 77 %
Platelet Count: 322 10*3/uL (ref 150–400)
RBC: 5.22 MIL/uL — ABNORMAL HIGH (ref 3.87–5.11)
RDW: 14 % (ref 11.5–15.5)
WBC Count: 14.7 10*3/uL — ABNORMAL HIGH (ref 4.0–10.5)
nRBC: 0 % (ref 0.0–0.2)

## 2021-10-21 LAB — SEDIMENTATION RATE: Sed Rate: 51 mm/hr — ABNORMAL HIGH (ref 0–22)

## 2021-10-21 MED ORDER — LIDOCAINE 5 % EX PTCH: 1.0000 | MEDICATED_PATCH | CUTANEOUS | 4 refills | Status: DC

## 2021-10-21 NOTE — Progress Notes (Signed)
Hematology and Oncology Follow Up Visit  TENA LINEBAUGH [id number] 1960/06/22 61 y.o. 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 6 months- next dose in 01/2022     Interim History:  Ms. Mandell is back for follow-up.  She is trying her best.  I realize that she does have challenges.  She still is bothered by a lot of pain.  She has pain in different parts of her body.  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  This is where she had shoulder surgery.  She has been taking some physical therapy for this.  She does go to a pool and does some swimming.  She still is not able to really work.  She still does not have the mental stamina to be able to work.  She has periods that she just is not able to find the right word to say.  She is still worried about a nodule on the right chest wall.  This is little bit lateral to what she had before.  I really cannot palpate any abnormality in that area.  She is still smoking a little bit.  She has had no bleeding.  There is no change in bowel or bladder habits.  She is going up to [place] to help a step father.  He is having cataract surgery.  Again I know she is trying her best.  She really is mentally tough.  She is trying to be as active as possible she is trying to get herself the best quality of life.  She has had no issues with cough.  There is no increased shortness of breath.  She has little bit of nausea but no vomiting.  Overall, I would say performance status is probably ECOG 1.     Medications:  Current Outpatient Medications:    acetaminophen (TYLENOL) 500 MG tablet, Take 500-1,000 mg  by mouth every 6 (six) hours as needed for moderate pain. , Disp: , Rfl:    albuterol (VENTOLIN HFA) 108 (90 Base) MCG/ACT inhaler, Inhale 2 puffs into the lungs every 6 (six) hours as needed for wheezing or shortness of breath., Disp: 8 g, Rfl: 2   amphetamine-dextroamphetamine (ADDERALL XR) 10 MG 24 hr capsule, Take 10 mg by mouth every morning., Disp: , Rfl:    baclofen (LIORESAL) 10 MG tablet, TAKE 1 TABLET BY MOUTH TWICE A DAY AS NEEDED FOR MUSCLE SPASM, Disp: 30 tablet, Rfl: 3   DULoxetine (CYMBALTA) 60 MG capsule, Take 1 capsule (60 mg total) by mouth daily. Fill Cymbalta 20m daily first, Disp: 90 capsule, Rfl: 3   ergocalciferol (VITAMIN D2) 1.25 MG (50000 UT) capsule, Take 1 capsule (50,000 Units total) by mouth once a week., Disp: 30 capsule, Rfl: 1   fluticasone (FLONASE) 50 MCG/ACT nasal spray, Place 2 sprays into both nostrils in the morning and at bedtime., Disp: 16 g, Rfl: 5   fluticasone (FLOVENT HFA) 110 MCG/ACT inhaler, Inhale 2 puffs into the lungs in the morning and at bedtime., Disp: 1 each, Rfl: 12   ipratropium (ATROVENT) 0.06 % nasal spray, Place 2 sprays into both nostrils 4 (four) times daily. As needed for nasal congestion, runny nose, Disp: 15 mL,  Rfl: 0   levothyroxine (SYNTHROID) 75 MCG tablet, Take 75 mcg by mouth daily before breakfast., Disp: , Rfl:    liothyronine (CYTOMEL) 5 MCG tablet, TAKE 2 TABLETS (=10MCG     TOTAL)     DAILY, Disp: 180 tablet, Rfl: 1   meloxicam (MOBIC) 15 MG tablet, Take 1 tablet by mouth as directed., Disp: , Rfl:    ALPRAZolam (XANAX) 0.25 MG tablet, TAKE 1 TABLET BY MOUTH DAILY AS NEEDED FOR ANXIETY (Patient not taking: Reported on 10/21/2021), Disp: 30 tablet, Rfl: 0 No current facility-administered medications for this visit.  Facility-Administered Medications Ordered in Other Visits:    alum & mag hydroxide-simeth (MAALOX/MYLANTA) 200-200-20 MG/5ML suspension 30 mL, 30 mL, Oral, Once, Tanner, Lyndon Code., PA-C  Allergies:  Allergies   Allergen Reactions   Bactrim [Sulfamethoxazole-Trimethoprim] Itching and Rash    Past Medical History, Surgical history, Social history, and Family History were reviewed and updated.  Review of Systems: Review of Systems  Constitutional:  Positive for fatigue.  HENT:   Positive for hearing loss and tinnitus.   Eyes:  Positive for eye problems.  Respiratory:  Positive for cough.   Cardiovascular: Negative.   Gastrointestinal:  Positive for abdominal pain and nausea.  Endocrine: Negative.   Genitourinary: Negative.    Musculoskeletal:  Positive for arthralgias and myalgias.  Skin: Negative.   Neurological:  Positive for light-headedness.  Hematological: Negative.   Psychiatric/Behavioral: Negative.      Physical Exam:  height is '5\' 5"'  (1.651 m) and weight is 172 lb 6.4 oz (78.2 kg). Her oral temperature is 98.2 F (36.8 C). Her blood pressure is 110/84 and her pulse is 84. Her respiration is 18 and oxygen saturation is 98%.   Wt Readings from Last 3 Encounters:  10/21/21 172 lb 6.4 oz (78.2 kg)  08/02/21 173 lb 4 oz (78.6 kg)  06/01/21 176 lb 1.9 oz (79.9 kg)    Physical Exam Vitals reviewed.  Constitutional:      Comments: She has had bilateral mastectomies.  Mastectomy scars are well-healed.  There might be some nodularity along the mastectomy site.  Again I have to believe this is scar tissue.  There is no erythema or warmth or swelling associated with these.  I cannot palpate any bilateral axillary lymph nodes.  HENT:     Head: Normocephalic and atraumatic.  Eyes:     Pupils: Pupils are equal, round, and reactive to light.  Cardiovascular:     Rate and Rhythm: Normal rate and regular rhythm.     Heart sounds: Normal heart sounds.  Pulmonary:     Effort: Pulmonary effort is normal.     Breath sounds: Normal breath sounds.  Abdominal:     General: Bowel sounds are normal.     Palpations: Abdomen is soft.  Musculoskeletal:        General: No tenderness or  deformity. Normal range of motion.     Cervical back: Normal range of motion.  Lymphadenopathy:     Cervical: No cervical adenopathy.  Skin:    General: Skin is warm and dry.     Findings: No erythema or rash.  Neurological:     Mental Status: She is alert and oriented to person, place, and time.  Psychiatric:        Behavior: Behavior normal.        Thought Content: Thought content normal.        Judgment: Judgment normal.   Lab Results  Component Value Date  WBC 14.7 (H) 10/21/2021   HGB 14.9 10/21/2021   HCT 44.2 10/21/2021   MCV 84.7 10/21/2021   PLT 322 10/21/2021     Chemistry      Component Value Date/Time   NA 135 10/21/2021 1143   K 4.3 10/21/2021 1143   CL 101 10/21/2021 1143   CO2 26 10/21/2021 1143   BUN 16 10/21/2021 1143   CREATININE 0.85 10/21/2021 1143   CREATININE 0.66 09/12/2017 0747      Component Value Date/Time   CALCIUM 10.9 (H) 10/21/2021 1143   ALKPHOS 113 10/21/2021 1143   AST 13 (L) 10/21/2021 1143   ALT 10 10/21/2021 1143   BILITOT 0.3 10/21/2021 1143      Impression and Plan: Ms. Palleschi is is a very nice 61 year old postmenopausal female.  She is originally from [place].  She moved down to [facility].  She has 2 grandchildren already.  Her children live on the [place].  She has early stage breast cancer.  Unfortunately, it was HER-2 positive.  Because this, she required chemotherapy with Herceptin.  Because of the chemotherapy, she has had side effects.  So far, these side effects have not let up.  I am not sure if she will ever have a significant improvement in her quality of life.  Her change in cognition is really her biggest concern.  She is worried about this deteriorating further and may be developing some dementia.  I just wish there is one way that we can try to help her out.  I just cannot think of any way that we can try to change or improve her performance status.  I know she is taking quite a few  medications.  Again, she is incredibly motivated.  She is trying to do her best.  She is just not able to work.  She is on permanent disability.  We will continue to follow her along.  I still think that the risk of cancer recurrent is going to be less than 10%.  I just do not think that she is going be able to tolerate either tamoxifen or an aromatase inhibitor.    [name], MD 7/28/202312:43 PM

## 2021-10-21 NOTE — Addendum Note (Signed)
Addended by: Burney Gauze R on: 10/21/2021 01:21 PM   Modules accepted: Orders

## 2021-10-22 ENCOUNTER — Other Ambulatory Visit: Payer: Self-pay

## 2021-10-22 LAB — CANCER ANTIGEN 27.29: CA 27.29: 15.6 U/mL (ref 0.0–38.6)

## 2021-10-25 ENCOUNTER — Other Ambulatory Visit: Payer: Self-pay

## 2021-11-22 ENCOUNTER — Encounter: Payer: Self-pay | Admitting: Family Medicine

## 2021-11-22 ENCOUNTER — Ambulatory Visit (INDEPENDENT_AMBULATORY_CARE_PROVIDER_SITE_OTHER): Payer: BC Managed Care – PPO | Admitting: Family Medicine

## 2021-11-22 VITALS — BP 120/78 | HR 104 | Temp 97.7°F | Ht 65.0 in | Wt 175.2 lb

## 2021-11-22 DIAGNOSIS — G4452 New daily persistent headache (NDPH): Secondary | ICD-10-CM

## 2021-11-22 DIAGNOSIS — M791 Myalgia, unspecified site: Secondary | ICD-10-CM

## 2021-11-22 DIAGNOSIS — M542 Cervicalgia: Secondary | ICD-10-CM

## 2021-11-22 MED ORDER — LORAZEPAM 0.5 MG PO TABS
0.5000 mg | ORAL_TABLET | ORAL | 0 refills | Status: DC
Start: 2021-11-22 — End: 2021-12-22

## 2021-11-22 NOTE — Patient Instructions (Signed)
Welcome to [facility]! It was a pleasure meeting you today.  As discussed, Please schedule a 2-3 wks follow up visit today.  PLEASE NOTE:  If you had any LAB tests please let us know if you have not heard back within a few days. You may see your results on MyChart before we have a chance to review them but we will give you a call once they are reviewed by [place]. If we ordered any REFERRALS today, please let us know if you have not heard from their office within the next week.  Let us know through MyChart if you are needing REFILLS, or have your pharmacy send [place] the request. You can also use MyChart to communicate with me or any office staff.  Please try these tips to maintain a healthy lifestyle:  Eat most of your calories during the day when you are active. Eliminate processed foods including packaged sweets (pies, cakes, cookies), reduce intake of potatoes, white bread, white pasta, and white rice. Look for whole grain options, oat flour or almond flour.  Each meal should contain half fruits/vegetables, one quarter protein, and one quarter carbs (no bigger than a computer mouse).  Cut down on sweet beverages. This includes juice, soda, and sweet tea. Also watch fruit intake, though this is a healthier sweet option, it still contains natural sugar! Limit to 3 servings daily.  Drink at least 1 glass of water with each meal and aim for at least 8 glasses per day  Exercise at least 150 minutes every week.

## 2021-11-22 NOTE — Progress Notes (Signed)
New Patient Office Visit  Subjective:  Patient ID: Stephanie Chase, female    DOB: [birth year]  Age: 61 y.o. MRN: [id number]  CC:  Chief Complaint  Patient presents with   Establish Care    HPI Stephanie Chase presents for new pt-changing docs Stephanie Chase  Daily HA for sev months, neck pain-had "soft tissue scan" "fatty tumor".  Started duloxetine and adderall 4-6 mo ago.  Back of head-occ rad to forehead. Constant pain. Tries stretching neck.  Occ rad to shoulders. Ortho though poss nerve that is affecting shoulder as well and told "frozen shoulder".  No n/v. No visual changes.  Occ "film" over mostly L eye.  About once/wk for sev months.  Lasts 30 sec. Can be sev times/d. No f/c. No congestion   no dbl vision. Poss vision loss "like a white piece of paper"  rubs eye and goes away.    Chronic photosens- eyes will tear a lot when in bright sun.  No h/o migraine. No speech changes.  Went 1 mo w/o adderall and still HA claustrophobia  Psych for cymbalta and adderall.  - keeps inc meds and depression seems to be getting worse.  No SI  Past Medical History:  Diagnosis Date   Allergy Seasonal   Anemia    Anxiety    Asthma    Breast cancer ([place])    right, 05/13/20 completed Taxol, on Herceptin   Cervical paraspinous muscle spasm    COPD (chronic obstructive pulmonary disease) (HCC)    emphysema   Depression    Discoid lupus    Discoid lupus    Emphysema of lung ([place])    Family history of bladder cancer    Family history of BRCA gene mutation    Family history of breast cancer    Family history of prostate cancer    GERD (gastroesophageal reflux disease)    Hypercholesteremia    Hypothyroidism    Neuromuscular disorder ([place])    Neuropathy    Osteoporosis    Pneumonia    Thyroid disease     Past Surgical History:  Procedure Laterality Date   ABLATION ON ENDOMETRIOSIS  7408   APPLICATION OF A-CELL OF EXTREMITY Right 10/22/2019   Procedure: EXCISION OF RIGHT BREAST WOUND  WITH PRIMARY CLOSURE;  Surgeon: [name] Going, DO;  Location: [facility];  Service: Plastics;  Laterality: Right;   BREAST LUMPECTOMY     DEBRIDEMENT AND CLOSURE WOUND Right 10/22/2019   Procedure: Excision of right breast wound;  Surgeon: [name] Going, DO;  Location: [facility];  Service: Plastics;  Laterality: Right;   MASTECTOMY W/ SENTINEL NODE BIOPSY Bilateral 09/05/2019   Procedure: BILATERAL MASTECTOMY WITH RIGHT SENTINEL LYMPH NODE BIOPSY;  Surgeon: Stark Klein, MD;  Location: [facility];  Service: General;  Laterality: Bilateral;  COMBINED WITH REGIONAL FOR POST OP PAIN   PORTACATH PLACEMENT Left 09/05/2019   Procedure: INSERTION PORT-A-CATH WITH ULTRASOUND GUIDANCE;  Surgeon: Stark Klein, MD;  Location: [facility];  Service: General;  Laterality: Left;   ROTATOR CUFF REPAIR Right 06/09/2021   TONSILLECTOMY     TUBAL LIGATION     WISDOM TOOTH EXTRACTION      Family History  Problem Relation Age of Onset   Breast cancer Mother 19   Diabetes Mother    Bladder Cancer Mother 80       ureteral cancer   Cancer Mother        blood cancer   Asthma Mother  Depression Mother    Hearing loss Mother    Kidney disease Mother    Vision loss Mother    Heart attack Father    Hyperlipidemia Father    Hypertension Father    Alzheimer's disease Father    Prostate cancer Father        dx. >65   Cancer Father    COPD Father    Hearing loss Father    Heart disease Father    Cancer Maternal Uncle        prostate or colon   BRCA 1/2 Daughter    Cancer Cousin        unknown type; dx. in her 44s (maternal cousin)   Alzheimer's disease Other    Breast cancer Other 8       maternal great-aunt   Cancer Maternal Aunt    Cancer Paternal Uncle     Social History   Socioeconomic History   Marital status: Divorced    Spouse name: Not on file   Number of children: 2   Years of education: Not on file   Highest education level: Some college, no degree  Occupational History   Not on  file  Tobacco Use   Smoking status: Every Day    Packs/day: 1.00    Years: 50.00    Total pack years: 50.00    Types: Cigarettes    Start date: 1973   Smokeless tobacco: Never  Vaping Use   Vaping Use: Never used  Substance and Sexual Activity   Alcohol use: Yes    Comment: Couple times a year   Drug use: No   Sexual activity: Yes    Partners: Male    Birth control/protection: None    Comment: BTL and ablation  Other Topics Concern   Not on file  Social History Narrative   Disabled-commercial lending assist   Social Determinants of Health   Financial Resource Strain: Not on file  Food Insecurity: Not on file  Transportation Needs: Not on file  Physical Activity: Not on file  Stress: Not on file  Social Connections: Not on file  Intimate Partner Violence: Not on file    ROS  ROS: Gen: no fever, chills  Skin: no rash, itching ENT: no ear pain, ear drainage, nasal congestion, rhinorrhea, sinus pressure, sore throat Eyes: HPI Resp: no cough, wheeze,SOB CV: no CP, palpitations, LE edema,  GI: no heartburn, n/v/d/c, abd pain GU: no dysuria, urgency, frequency, hematuria MSK: R shoulder Neuro: memory bad since chemo and fatigue so on adderall. Psych: HPI  Objective:   Today's Vitals: BP 120/78   Pulse (!) 104   Temp 97.7 F (36.5 C)   Ht '5\' 5"'  (1.651 m)   Wt 175 lb 3.2 oz (79.5 kg)   SpO2 98%   BMI 29.15 kg/m   Physical Exam  Gen: WDWN NAD WF HEENT: NCAT, conjunctiva not injected, sclera nonicteric. Fundi benign, EOMI NECK:  supple, no thyromegaly, no nodes, no carotid bruits CARDIAC: RRR, S1S2+, no murmur.  LUNGS: CTAB. No wheezes EXT:  no edema MSK: no gross abnormalities. Lipoma occiput center-approx 3 cm.  No ttp c spine.   NEURO: A&O x3.  CN II-XII intact. F-n, RAM, pronator drift neg. PSYCH: normal mood. Good eye contact.   vitiligo  Reviewed CT soft tissue neck   Assessment & Plan:   Problem List Items Addressed This Visit   None Visit  Diagnoses     New daily persistent headache    -  Primary   Cervicalgia       Myalgia         1.  Chronic daily headache-new but going on for several months.  Patient with history of breast cancer.  This may be coming from her neck/neuropathy, chronic daily headache, other.  Will check MRI brain and C-spine.  She does not like taking a lot of medications and is on several.  Will hold off on adding more medicines at this time.  Await studies.  Follow-up in 2 weeks (aware that the scans may not be done by then) 2.  Cervicalgia with radiculopathy.  Unclear if chronic or new.  She has had rotator cuff surgery on the right, however is not resolving as would be expected.  Per patient, Ortho mentioned that it may be coming from her neck.  Will check MRI of C-spine. 3.  Claustrophobia-Will send in Ativan for study.  She is aware that she will need a driver. 4.  Myalgias-patient has a lot going on and a lot of reasons for this.  Will need to review her chart.  Consider fibromyalgia.  Follow-up in 2 weeks   Outpatient Encounter Medications as of 11/22/2021  Medication Sig   acetaminophen (TYLENOL) 500 MG tablet Take 500-1,000 mg by mouth every 6 (six) hours as needed for moderate pain.    albuterol (VENTOLIN HFA) 108 (90 Base) MCG/ACT inhaler Inhale 2 puffs into the lungs every 6 (six) hours as needed for wheezing or shortness of breath.   amphetamine-dextroamphetamine (ADDERALL XR) 20 MG 24 hr capsule Take 20 mg by mouth every morning.   DULoxetine (CYMBALTA) 60 MG capsule Take 1 capsule (60 mg total) by mouth daily. Fill Cymbalta 43m daily first   ergocalciferol (VITAMIN D2) 1.25 MG (50000 UT) capsule Take 1 capsule (50,000 Units total) by mouth once a week.   fluticasone (FLONASE) 50 MCG/ACT nasal spray Place 2 sprays into both nostrils in the morning and at bedtime.   fluticasone (FLOVENT HFA) 110 MCG/ACT inhaler Inhale 2 puffs into the lungs in the morning and at bedtime.   ipratropium (ATROVENT)  0.06 % nasal spray Place 2 sprays into both nostrils 4 (four) times daily. As needed for nasal congestion, runny nose   levothyroxine (SYNTHROID) 75 MCG tablet Take 75 mcg by mouth daily before breakfast.   lidocaine (LIDODERM) 5 % Place 1 patch onto the skin daily. Remove & Discard patch within 12 hours or as directed by MD   liothyronine (CYTOMEL) 5 MCG tablet TAKE 2 TABLETS (=10MCG     TOTAL)     DAILY   meloxicam (MOBIC) 15 MG tablet Take 1 tablet by mouth as directed.   [DISCONTINUED] ALPRAZolam (XANAX) 0.25 MG tablet TAKE 1 TABLET BY MOUTH DAILY AS NEEDED FOR ANXIETY (Patient not taking: Reported on 10/21/2021)   [DISCONTINUED] amphetamine-dextroamphetamine (ADDERALL XR) 10 MG 24 hr capsule Take 10 mg by mouth every morning. (Patient not taking: Reported on 11/22/2021)   [DISCONTINUED] baclofen (LIORESAL) 10 MG tablet TAKE 1 TABLET BY MOUTH TWICE A DAY AS NEEDED FOR MUSCLE SPASM   Facility-Administered Encounter Medications as of 11/22/2021  Medication   alum & mag hydroxide-simeth (MAALOX/MYLANTA) 200-200-20 MG/5ML suspension 30 mL    Follow-up: Return in about 2 weeks (around 12/06/2021) for pain.   AWellington Hampshire MD

## 2021-11-25 ENCOUNTER — Other Ambulatory Visit: Payer: Self-pay

## 2021-12-01 ENCOUNTER — Encounter: Payer: Self-pay | Admitting: Family

## 2021-12-01 ENCOUNTER — Telehealth (INDEPENDENT_AMBULATORY_CARE_PROVIDER_SITE_OTHER): Payer: BC Managed Care – PPO | Admitting: Family

## 2021-12-01 ENCOUNTER — Other Ambulatory Visit: Payer: Self-pay | Admitting: Family

## 2021-12-01 VITALS — Temp 97.7°F

## 2021-12-01 DIAGNOSIS — R0600 Dyspnea, unspecified: Secondary | ICD-10-CM | POA: Diagnosis not present

## 2021-12-01 DIAGNOSIS — U071 COVID-19: Secondary | ICD-10-CM | POA: Diagnosis not present

## 2021-12-01 DIAGNOSIS — J4530 Mild persistent asthma, uncomplicated: Secondary | ICD-10-CM

## 2021-12-01 MED ORDER — FLUTICASONE PROPIONATE HFA 110 MCG/ACT IN AERO: 2.0000 | INHALATION_SPRAY | Freq: Two times a day (BID) | RESPIRATORY_TRACT | 5 refills | Status: DC

## 2021-12-01 MED ORDER — ALBUTEROL SULFATE HFA 108 (90 BASE) MCG/ACT IN AERS: 2.0000 | INHALATION_SPRAY | Freq: Four times a day (QID) | RESPIRATORY_TRACT | 2 refills | Status: DC | PRN

## 2021-12-01 NOTE — Progress Notes (Signed)
MyChart Video Visit    Virtual Visit via Video Note   This format is felt to be most appropriate for this patient at this time. Physical exam was limited by quality of the video and audio technology used for the visit. CMA was able to get the patient set up on a video visit.  Patient location: Home. Patient and provider in visit Provider location: Office  I discussed the limitations of evaluation and management by telemedicine and the availability of in person appointments. The patient expressed understanding and agreed to proceed.  Visit Date: 12/01/2021  Today's healthcare provider: Jeanie Sewer, NP     Subjective:   Patient ID: Stephanie Chase, female    DOB: [birth year], 61 y.o.   MRN: [id number]  Chief Complaint  Patient presents with   Covid Positive    Tested positive for covid today. Symptoms started Monday    Cough   Sore Throat    HPI Upper Respiratory Infection: Symptoms include congestion, no  fever, non productive cough, and sore throat.  Onset of symptoms was 5 days ago, gradually improving since that time, but sometimes it gets worse again. She is drinking moderate amounts of fluids. Evaluation to date: none.  Treatment to date:  over the counter cold medicines  . She tested negative for Covid when sx started, but then tested positive today.   Assessment & Plan:   Problem List Items Addressed This Visit   None Visit Diagnoses     COVID-19    -  Primary pt reports having difficulty getting online for visit over several days, so has missed her window for tx, sx for 5 days. She is feeling a little better overall, mainly having cough and travelling to [place]. Advised she continue her inhalers, drink plenty of water and take aleve 1-2 times per day.     Dyspnea, unspecified type       Relevant Medications   albuterol (VENTOLIN HFA) 108 (90 Base) MCG/ACT inhaler   Mild persistent asthma without complication       Relevant Medications    albuterol (VENTOLIN HFA) 108 (90 Base) MCG/ACT inhaler   fluticasone (FLOVENT HFA) 110 MCG/ACT inhaler      Past Medical History:  Diagnosis Date   Allergy Seasonal   Anemia    Anxiety    Asthma    Breast cancer ([place])    right, 05/13/20 completed Taxol, on Herceptin   Cervical paraspinous muscle spasm    COPD (chronic obstructive pulmonary disease) (HCC)    emphysema   Depression    Discoid lupus    Discoid lupus    Emphysema of lung ([place])    Family history of bladder cancer    Family history of BRCA gene mutation    Family history of breast cancer    Family history of prostate cancer    GERD (gastroesophageal reflux disease)    Hypercholesteremia    Hypothyroidism    Neuromuscular disorder ([place])    Neuropathy    Osteoporosis    Pneumonia    Thyroid disease     Past Surgical History:  Procedure Laterality Date   ABLATION ON ENDOMETRIOSIS  4196   APPLICATION OF A-CELL OF EXTREMITY Right 10/22/2019   Procedure: EXCISION OF RIGHT BREAST WOUND WITH PRIMARY CLOSURE;  Surgeon: [name] Going, DO;  Location: [facility];  Service: Plastics;  Laterality: Right;   BREAST LUMPECTOMY     DEBRIDEMENT AND CLOSURE WOUND Right 10/22/2019   Procedure: Excision of  right breast wound;  Surgeon: [name] Going, DO;  Location: [facility];  Service: Plastics;  Laterality: Right;   MASTECTOMY W/ SENTINEL NODE BIOPSY Bilateral 09/05/2019   Procedure: BILATERAL MASTECTOMY WITH RIGHT SENTINEL LYMPH NODE BIOPSY;  Surgeon: Stark Klein, MD;  Location: [facility];  Service: General;  Laterality: Bilateral;  COMBINED WITH REGIONAL FOR POST OP PAIN   PORTACATH PLACEMENT Left 09/05/2019   Procedure: INSERTION PORT-A-CATH WITH ULTRASOUND GUIDANCE;  Surgeon: Stark Klein, MD;  Location: [facility];  Service: General;  Laterality: Left;   ROTATOR CUFF REPAIR Right 06/09/2021   TONSILLECTOMY     TUBAL LIGATION     WISDOM TOOTH EXTRACTION      Outpatient Medications Prior to Visit  Medication Sig Dispense  Refill   acetaminophen (TYLENOL) 500 MG tablet Take 500-1,000 mg by mouth every 6 (six) hours as needed for moderate pain.      amphetamine-dextroamphetamine (ADDERALL XR) 20 MG 24 hr capsule Take 20 mg by mouth every morning.     DULoxetine (CYMBALTA) 30 MG capsule Take 90 mg by mouth daily.     DULoxetine (CYMBALTA) 60 MG capsule Take 1 capsule (60 mg total) by mouth daily. Fill Cymbalta 55m daily first 90 capsule 3   ergocalciferol (VITAMIN D2) 1.25 MG (50000 UT) capsule Take 1 capsule (50,000 Units total) by mouth once a week. 30 capsule 1   fluticasone (FLONASE) 50 MCG/ACT nasal spray Place 2 sprays into both nostrils in the morning and at bedtime. 16 g 5   ipratropium (ATROVENT) 0.06 % nasal spray Place 2 sprays into both nostrils 4 (four) times daily. As needed for nasal congestion, runny nose 15 mL 0   levothyroxine (SYNTHROID) 75 MCG tablet Take 75 mcg by mouth daily before breakfast.     lidocaine (LIDODERM) 5 % Place 1 patch onto the skin daily. Remove & Discard patch within 12 hours or as directed by MD 30 patch 4   liothyronine (CYTOMEL) 5 MCG tablet TAKE 2 TABLETS (=10MCG     TOTAL)     DAILY 180 tablet 1   LORazepam (ATIVAN) 0.5 MG tablet Take 1 tablet (0.5 mg total) by mouth as directed. 1 tab 1 hr prior MRI.  Can repeat in 326m if needed. 3 tablet 0   meloxicam (MOBIC) 15 MG tablet Take 1 tablet by mouth as directed.     albuterol (VENTOLIN HFA) 108 (90 Base) MCG/ACT inhaler Inhale 2 puffs into the lungs every 6 (six) hours as needed for wheezing or shortness of breath. 8 g 2   fluticasone (FLOVENT HFA) 110 MCG/ACT inhaler Inhale 2 puffs into the lungs in the morning and at bedtime. 1 each 12   alum & mag hydroxide-simeth (MAALOX/MYLANTA) 200-200-20 MG/5ML suspension 30 mL      No facility-administered medications prior to visit.    Allergies  Allergen Reactions   Bactrim [Sulfamethoxazole-Trimethoprim] Itching and Rash       Objective:   Physical Exam Vitals and  nursing note reviewed.  Constitutional:      General: She is not in acute distress.    Appearance: Normal appearance.  HENT:     Head: Normocephalic.  Pulmonary:     Effort: No respiratory distress.  Musculoskeletal:     Cervical back: Normal range of motion.  Skin:    General: Skin is dry.     Coloration: Skin is not pale.  Neurological:     Mental Status: She is alert and oriented to person, place, and time.  Psychiatric:        Mood and Affect: Mood normal.   Temp 97.7 F (36.5 C)   Wt Readings from Last 3 Encounters:  11/22/21 175 lb 3.2 oz (79.5 kg)  10/21/21 172 lb 6.4 oz (78.2 kg)  08/02/21 173 lb 4 oz (78.6 kg)       I discussed the assessment and treatment plan with the patient. The patient was provided an opportunity to ask questions and all were answered. The patient agreed with the plan and demonstrated an understanding of the instructions.   The patient was advised to call back or seek an in-person evaluation if the symptoms worsen or if the condition fails to improve as anticipated.  Jeanie Sewer, NP [facility] [phone] (phone) [phone] (fax)  [place]

## 2021-12-07 ENCOUNTER — Telehealth: Payer: Self-pay

## 2021-12-07 NOTE — Telephone Encounter (Signed)
-----   Message from Jeanie Sewer, NP sent at 12/05/2021  9:23 PM EDT ----- Regarding: Flovent inhaler pharmacy request Can you start a PA for her Flovent inhaler please? I have no idea which alternative her ins. would even cover.  Let me know what alternatives they give you, thanks

## 2021-12-07 NOTE — Telephone Encounter (Signed)
PA started today on 9/13 Awaiting decision Key: [id number]

## 2021-12-14 ENCOUNTER — Ambulatory Visit
Admission: RE | Admit: 2021-12-14 | Discharge: 2021-12-14 | Disposition: A | Discharge status: Home / Self Care | Payer: BC Managed Care – PPO | Source: Ambulatory Visit | Attending: Family Medicine | Admitting: Family Medicine

## 2021-12-14 DIAGNOSIS — M542 Cervicalgia: Secondary | ICD-10-CM

## 2021-12-14 DIAGNOSIS — G4452 New daily persistent headache (NDPH): Secondary | ICD-10-CM

## 2021-12-15 ENCOUNTER — Other Ambulatory Visit: Payer: BC Managed Care – PPO

## 2021-12-15 ENCOUNTER — Ambulatory Visit (INDEPENDENT_AMBULATORY_CARE_PROVIDER_SITE_OTHER): Payer: BC Managed Care – PPO | Admitting: Family Medicine

## 2021-12-15 ENCOUNTER — Encounter: Payer: Self-pay | Admitting: Family Medicine

## 2021-12-15 ENCOUNTER — Telehealth: Payer: Self-pay | Admitting: Family Medicine

## 2021-12-15 VITALS — BP 122/77 | HR 91 | Temp 97.9°F | Resp 14 | Ht 65.0 in | Wt 174.0 lb

## 2021-12-15 DIAGNOSIS — E559 Vitamin D deficiency, unspecified: Secondary | ICD-10-CM | POA: Diagnosis not present

## 2021-12-15 DIAGNOSIS — E538 Deficiency of other specified B group vitamins: Secondary | ICD-10-CM

## 2021-12-15 DIAGNOSIS — E038 Other specified hypothyroidism: Secondary | ICD-10-CM

## 2021-12-15 DIAGNOSIS — M2550 Pain in unspecified joint: Secondary | ICD-10-CM | POA: Diagnosis not present

## 2021-12-15 DIAGNOSIS — R5383 Other fatigue: Secondary | ICD-10-CM

## 2021-12-15 DIAGNOSIS — G4452 New daily persistent headache (NDPH): Secondary | ICD-10-CM

## 2021-12-15 DIAGNOSIS — L719 Rosacea, unspecified: Secondary | ICD-10-CM | POA: Insufficient documentation

## 2021-12-15 DIAGNOSIS — R4189 Other symptoms and signs involving cognitive functions and awareness: Secondary | ICD-10-CM

## 2021-12-15 HISTORY — DX: Rosacea, unspecified: L71.9

## 2021-12-15 LAB — COMPREHENSIVE METABOLIC PANEL
ALT: 18 U/L (ref 0–35)
AST: 19 U/L (ref 0–37)
Albumin: 4.1 g/dL (ref 3.5–5.2)
Alkaline Phosphatase: 107 U/L (ref 39–117)
BUN: 11 mg/dL (ref 6–23)
CO2: 28 mEq/L (ref 19–32)
Calcium: 10.6 mg/dL — ABNORMAL HIGH (ref 8.4–10.5)
Chloride: 102 mEq/L (ref 96–112)
Creatinine, Ser: 0.77 mg/dL (ref 0.40–1.20)
GFR: 84.08 mL/min (ref >60.00)
Glucose, Bld: 86 mg/dL (ref 70–99)
Potassium: 4.4 mEq/L (ref 3.5–5.1)
Sodium: 136 mEq/L (ref 135–145)
Total Bilirubin: 0.5 mg/dL (ref 0.2–1.2)
Total Protein: 7.3 g/dL (ref 6.0–8.3)

## 2021-12-15 NOTE — Telephone Encounter (Signed)
.  Reason for Referral Request:  Headaches & Brain fog  Has Patient been seen by PCP for this complaint? Yes  No, Please schedule patient for appointment for complaint.  Yes, Please find out following information:  Reason: Pt states current provider, Dr Krista Blue, cannot see her without a new referral.   Referral to which Specialty: Neurology  Preferred office/ provider:  Dr. Krista Blue

## 2021-12-15 NOTE — Patient Instructions (Addendum)
Call neurologist about getting in for headaches and brain fog.    Talk to Dr. Latanya Maudlin about the neck MRI.

## 2021-12-15 NOTE — Progress Notes (Signed)
Subjective:     Patient ID: Stephanie Chase, female    DOB: 1960-05-06, 61 y.o.   MRN: [id number]  Chief Complaint  Patient presents with   Two week follow-up for pain    About the same.    HPI  Chronic pain-all over.  Saw rheum in past-didn't like.  R shoulder.  Surgery in past.  PT.  Still dec ROM-getting cortisone injections.  Sees ortho  Dr. Latanya Maudlin 10/18.  Had MRI c spine HA-same.  Still there daily.  Sometimes tylenol/advil may help.  Can last all day. Brain fog. Adderall not help. Very concerned about both.  Fatigue is terrible which contributes to depression.  Can't find words at times.  Hasn't done neuropsych testing.  Concerned dementia-dad had alzheimers.  Can't sleep at times.    Health Maintenance Due  Topic Date Due   MAMMOGRAM  06/08/2021    Past Medical History:  Diagnosis Date   Allergy Seasonal   Anemia    Anxiety    Asthma    Breast cancer ([place])    right, 05/13/20 completed Taxol, on Herceptin   Cervical paraspinous muscle spasm    COPD (chronic obstructive pulmonary disease) ([facility])    emphysema   Depression    Discoid lupus    Discoid lupus    Emphysema of lung ([place])    Family history of bladder cancer    Family history of BRCA gene mutation    Family history of breast cancer    Family history of prostate cancer    GERD (gastroesophageal reflux disease)    Hypercholesteremia    Hypothyroidism    Neuromuscular disorder ([place])    Neuropathy    Osteoporosis    Pneumonia    Thyroid disease     Past Surgical History:  Procedure Laterality Date   ABLATION ON ENDOMETRIOSIS  8242   APPLICATION OF A-CELL OF EXTREMITY Right 10/22/2019   Procedure: EXCISION OF RIGHT BREAST WOUND WITH PRIMARY CLOSURE;  Surgeon: [name] Going, DO;  Location: [facility];  Service: Plastics;  Laterality: Right;   BREAST LUMPECTOMY     DEBRIDEMENT AND CLOSURE WOUND Right 10/22/2019   Procedure: Excision of right breast wound;  Surgeon: [name] Going, DO;   Location: [facility];  Service: Plastics;  Laterality: Right;   MASTECTOMY W/ SENTINEL NODE BIOPSY Bilateral 09/05/2019   Procedure: BILATERAL MASTECTOMY WITH RIGHT SENTINEL LYMPH NODE BIOPSY;  Surgeon: Stark Klein, MD;  Location: [facility];  Service: General;  Laterality: Bilateral;  COMBINED WITH REGIONAL FOR POST OP PAIN   PORTACATH PLACEMENT Left 09/05/2019   Procedure: INSERTION PORT-A-CATH WITH ULTRASOUND GUIDANCE;  Surgeon: Stark Klein, MD;  Location: [facility];  Service: General;  Laterality: Left;   ROTATOR CUFF REPAIR Right 06/09/2021   TONSILLECTOMY     TUBAL LIGATION     WISDOM TOOTH EXTRACTION      Outpatient Medications Prior to Visit  Medication Sig Dispense Refill   acetaminophen (TYLENOL) 500 MG tablet Take 500-1,000 mg by mouth every 6 (six) hours as needed for moderate pain.      albuterol (VENTOLIN HFA) 108 (90 Base) MCG/ACT inhaler Inhale 2 puffs into the lungs every 6 (six) hours as needed for wheezing or shortness of breath. 8 g 2   amphetamine-dextroamphetamine (ADDERALL XR) 20 MG 24 hr capsule Take 20 mg by mouth every morning.     Azelastine HCl 137 MCG/SPRAY SOLN 1 puff in each nostril Nasally Twice a day as needed for 30 day(s) (Patient  not taking: Reported on 12/15/2021)     BACLOFEN PO Baclofen     DULoxetine (CYMBALTA) 30 MG capsule Take 90 mg by mouth daily.     DULoxetine (CYMBALTA) 60 MG capsule Take 1 capsule (60 mg total) by mouth daily. Fill Cymbalta 109m daily first 90 capsule 3   DULoxetine (CYMBALTA) 60 MG capsule 1 capsule Orally Once a day for 30 day(s) (Patient not taking: Reported on 12/15/2021)     ergocalciferol (VITAMIN D2) 1.25 MG (50000 UT) capsule Take 1 capsule (50,000 Units total) by mouth once a week. 30 capsule 1   fluticasone (FLONASE) 50 MCG/ACT nasal spray Place 2 sprays into both nostrils in the morning and at bedtime. 16 g 5   fluticasone (FLOVENT HFA) 110 MCG/ACT inhaler Inhale 2 puffs into the lungs in the morning and at bedtime. 1 each 5    ipratropium (ATROVENT) 0.06 % nasal spray Place 2 sprays into both nostrils 4 (four) times daily. As needed for nasal congestion, runny nose (Patient not taking: Reported on 12/15/2021) 15 mL 0   levothyroxine (SYNTHROID) 75 MCG tablet Take 75 mcg by mouth daily before breakfast.     lidocaine (LIDODERM) 5 % Place 1 patch onto the skin daily. Remove & Discard patch within 12 hours or as directed by MD 30 patch 4   liothyronine (CYTOMEL) 5 MCG tablet TAKE 2 TABLETS (=10MCG     TOTAL)     DAILY 180 tablet 1   LORazepam (ATIVAN) 0.5 MG tablet Take 1 tablet (0.5 mg total) by mouth as directed. 1 tab 1 hr prior MRI.  Can repeat in 341m if needed. 3 tablet 0   meloxicam (MOBIC) 15 MG tablet Take 1 tablet by mouth as directed.     No facility-administered medications prior to visit.    Allergies  Allergen Reactions   Bactrim [Sulfamethoxazole-Trimethoprim] Itching and Rash   ROS neg/noncontributory except as noted HPI/below Tinnitis and HOH.        Objective:     BP 122/77 (BP Location: Left Arm, Patient Position: Sitting)   Pulse 91   Temp 97.9 F (36.6 C) (Temporal)   Resp 14   Ht '5\' 5"'  (1.651 m)   Wt 174 lb (78.9 kg)   SpO2 93%   BMI 28.96 kg/m  Wt Readings from Last 3 Encounters:  12/15/21 174 lb (78.9 kg)  11/22/21 175 lb 3.2 oz (79.5 kg)  10/21/21 172 lb 6.4 oz (78.2 kg)    Physical Exam   Gen: WDWN NAD HEENT: NCAT, conjunctiva not injected, sclera nonicteric CARDIAC: RRR, S1S2+, no murmur.  MSK: no gross abnormalities.  NEURO: A&O x3.  CN II-XII intact.  PSYCH: normal mood. Good eye contact  Discussed MRI's.   Spent 40 min with patient reviewing MRIs, labs, history.  Discussing plan.  Referrals.    Assessment & Plan:   Problem List Items Addressed This Visit       Endocrine   Hypothyroidism, unspecified   Relevant Orders   TSH     Other   Vitamin D deficiency   Relevant Orders   VITAMIN D 25 Hydroxy (Vit-D Deficiency, Fractures)   Polyarthralgia -  Primary   Other Visit Diagnoses     Arthralgia, unspecified joint       Hypercalcemia       Relevant Orders   Comprehensive metabolic panel   PTH, intact (no Ca)   VITAMIN D 25 Hydroxy (Vit-D Deficiency, Fractures)   B12 deficiency  Relevant Orders   Vitamin B12   New daily persistent headache       Relevant Medications   DULoxetine (CYMBALTA) 60 MG capsule   BACLOFEN PO   Brain fog       Other fatigue         1.  Polyarthralgias-right shoulder pain-reviewed cervical MRI with the patient.  Some of her symptoms may or may not be coming from the findings on MRI.  She does have an appointment with orthopedics in a couple of weeks.  Advised to discuss MRI findings with him if it is consistent with her presentation. 2.  Chronic headaches-not sure if potentially sleep apnea, post chemo condition, possibly pseudotumor cerebri (partially empty inmoria) will refer patient to neurology.  She declines taking any additional meds at this time.  She would like to defer to them. 3.  Brain fog-patient is very concerned.  Not sure if this is post chemo, chronic fatigue, other.  Symptoms have not improved at all on Adderall.  I suggested getting off of it if she sees no difference.  Will refer to neurology. 4.  Chronic fatigue-multifactorial.  Not sure if component of sleep apnea, postchemotherapy, depression, other.  Continue to monitor 5.  Hypothyroidism-chronic.  Last TSH was suppressed.  Meds were not changed.  Will repeat TSH 6.  Hypercalcemia-she is not on a diuretic.  Will repeat CMP, PTH, vitamin D 7.  Vitamin B12 deficiency-has not been taking supplements.  Will check B12 level.  Follow-up in 3 months  No orders of the defined types were placed in this encounter.   Wellington Hampshire, MD

## 2021-12-16 ENCOUNTER — Encounter: Payer: Self-pay | Admitting: Hematology

## 2021-12-16 ENCOUNTER — Other Ambulatory Visit: Payer: Self-pay

## 2021-12-16 LAB — TSH: TSH: 0.47 u[IU]/mL (ref 0.35–5.50)

## 2021-12-16 LAB — VITAMIN D 25 HYDROXY (VIT D DEFICIENCY, FRACTURES): VITD: 48.72 ng/mL (ref 30.00–100.00)

## 2021-12-16 LAB — VITAMIN B12: Vitamin B-12: 239 pg/mL (ref 211–911)

## 2021-12-16 NOTE — Telephone Encounter (Signed)
Referral placed on 12/15/21.

## 2021-12-17 LAB — PARATHYROID HORMONE, INTACT (NO CA): PTH: 67 pg/mL (ref 16–77)

## 2021-12-21 ENCOUNTER — Other Ambulatory Visit: Payer: Self-pay | Admitting: Neurology

## 2021-12-22 ENCOUNTER — Inpatient Hospital Stay: Payer: BC Managed Care – PPO | Attending: Hematology

## 2021-12-22 ENCOUNTER — Inpatient Hospital Stay (HOSPITAL_BASED_OUTPATIENT_CLINIC_OR_DEPARTMENT_OTHER): Payer: BC Managed Care – PPO | Admitting: Hematology & Oncology

## 2021-12-22 ENCOUNTER — Other Ambulatory Visit: Payer: Self-pay

## 2021-12-22 ENCOUNTER — Encounter: Payer: Self-pay | Admitting: Hematology & Oncology

## 2021-12-22 VITALS — BP 124/71 | HR 88 | Temp 98.5°F | Resp 18 | Ht 65.0 in | Wt 173.0 lb

## 2021-12-22 DIAGNOSIS — G629 Polyneuropathy, unspecified: Secondary | ICD-10-CM | POA: Diagnosis not present

## 2021-12-22 DIAGNOSIS — C50911 Malignant neoplasm of unspecified site of right female breast: Secondary | ICD-10-CM | POA: Diagnosis present

## 2021-12-22 DIAGNOSIS — Z170 Estrogen receptor positive status [ER+]: Secondary | ICD-10-CM | POA: Diagnosis not present

## 2021-12-22 DIAGNOSIS — Z9013 Acquired absence of bilateral breasts and nipples: Secondary | ICD-10-CM | POA: Insufficient documentation

## 2021-12-22 DIAGNOSIS — Z9221 Personal history of antineoplastic chemotherapy: Secondary | ICD-10-CM | POA: Insufficient documentation

## 2021-12-22 DIAGNOSIS — C50411 Malignant neoplasm of upper-outer quadrant of right female breast: Secondary | ICD-10-CM | POA: Diagnosis not present

## 2021-12-22 DIAGNOSIS — R413 Other amnesia: Secondary | ICD-10-CM | POA: Insufficient documentation

## 2021-12-22 DIAGNOSIS — M810 Age-related osteoporosis without current pathological fracture: Secondary | ICD-10-CM | POA: Diagnosis not present

## 2021-12-22 DIAGNOSIS — F17210 Nicotine dependence, cigarettes, uncomplicated: Secondary | ICD-10-CM | POA: Insufficient documentation

## 2021-12-22 LAB — CMP (CANCER CENTER ONLY)
ALT: 15 U/L (ref 0–44)
AST: 18 U/L (ref 15–41)
Albumin: 4.6 g/dL (ref 3.5–5.0)
Alkaline Phosphatase: 111 U/L (ref 38–126)
Anion gap: 8 (ref 5–15)
BUN: 7 mg/dL (ref 6–20)
CO2: 27 mmol/L (ref 22–32)
Calcium: 11.2 mg/dL — ABNORMAL HIGH (ref 8.9–10.3)
Chloride: 101 mmol/L (ref 98–111)
Creatinine: 0.73 mg/dL (ref 0.44–1.00)
GFR, Estimated: >60 mL/min (ref >60)
Glucose, Bld: 100 mg/dL — ABNORMAL HIGH (ref 70–99)
Potassium: 4.3 mmol/L (ref 3.5–5.1)
Sodium: 136 mmol/L (ref 135–145)
Total Bilirubin: 0.5 mg/dL (ref 0.3–1.2)
Total Protein: 7.7 g/dL (ref 6.5–8.1)

## 2021-12-22 LAB — CBC WITH DIFFERENTIAL (CANCER CENTER ONLY)
Abs Immature Granulocytes: 0.01 10*3/uL (ref 0.00–0.07)
Basophils Absolute: 0 10*3/uL (ref 0.0–0.1)
Basophils Relative: 1 %
Eosinophils Absolute: 0 10*3/uL (ref 0.0–0.5)
Eosinophils Relative: 1 %
HCT: 43.7 % (ref 36.0–46.0)
Hemoglobin: 14.5 g/dL (ref 12.0–15.0)
Immature Granulocytes: 0 %
Lymphocytes Relative: 34 %
Lymphs Abs: 1.8 10*3/uL (ref 0.7–4.0)
MCH: 28.7 pg (ref 26.0–34.0)
MCHC: 33.2 g/dL (ref 30.0–36.0)
MCV: 86.4 fL (ref 80.0–100.0)
Monocytes Absolute: 0.4 10*3/uL (ref 0.1–1.0)
Monocytes Relative: 7 %
Neutro Abs: 3.1 10*3/uL (ref 1.7–7.7)
Neutrophils Relative %: 57 %
Platelet Count: 305 10*3/uL (ref 150–400)
RBC: 5.06 MIL/uL (ref 3.87–5.11)
RDW: 13.5 % (ref 11.5–15.5)
WBC Count: 5.5 10*3/uL (ref 4.0–10.5)
nRBC: 0 % (ref 0.0–0.2)

## 2021-12-22 LAB — VITAMIN B12: Vitamin B-12: 263 pg/mL (ref 180–914)

## 2021-12-22 LAB — FERRITIN: Ferritin: 50 ng/mL (ref 11–307)

## 2021-12-22 LAB — IRON AND IRON BINDING CAPACITY (CC-WL,HP ONLY)
Iron: 114 ug/dL (ref 28–170)
Saturation Ratios: 31 % (ref 10.4–31.8)
TIBC: 368 ug/dL (ref 250–450)
UIBC: 254 ug/dL (ref 148–442)

## 2021-12-22 LAB — LACTATE DEHYDROGENASE: LDH: 164 U/L (ref 98–192)

## 2021-12-22 LAB — TSH: TSH: 0.501 u[IU]/mL (ref 0.350–4.500)

## 2021-12-22 NOTE — Progress Notes (Signed)
Hematology and Oncology Follow Up Visit  Stephanie Chase [id number] 1960-10-29 61 y.o. 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 6 months- next dose in 01/2022     Interim History:  Stephanie Chase is back for follow-up.  She has been doing little bit better today.  She did have a nice time in [place].  She was out there about 3 weeks ago.  Her family also came out there.  I was she had a good time with them.  They did not do a lot but just mostly relaxed and enjoyed seeing each other.  Otherwise, she still has the same problems.  She has a new primary doctor.  She went ahead and ordered an MRI of the head and MRI of the neck on Stephanie Chase.  The MRI of the brain showed that there was an empty sella.  I am not sure exactly what this signifies.  However, his [place] will see a neurologist for this.  MRI of the cervical spine did show some significant spinal stenosis at C5-6.  This is on the right side.  She is going to see orthopedic surgeon for this.  She still has memory problems.  She just does not have a lot of recovery of her memory with respect to recall.  She has difficulty with word finding.  I think this may always be a problem for her.  Thankfully, the MRI did not show any evidence of recurrent breast cancer.  Her last CA 27.29 was still normal at 15.6.  She has had no fever.  She has had no bleeding.  She has had some constipation which is chronic.  There is no leg swelling.  She has some neuropathy in the feet.  She is still smoking but again is trying to cut back and quit.  Overall, I would say that her performance status is probably ECOG  2.   Medications:  Current Outpatient Medications:    acetaminophen (TYLENOL) 500 MG tablet, Take 500-1,000 mg by mouth every 6 (six) hours as needed for moderate pain. , Disp: , Rfl:    albuterol (VENTOLIN HFA) 108 (90 Base) MCG/ACT inhaler, Inhale 2 puffs into the lungs every 6 (six) hours as needed for wheezing or shortness of breath., Disp: 8 g, Rfl: 2   BACLOFEN PO, Baclofen, Disp: , Rfl:    DULoxetine (CYMBALTA) 60 MG capsule, , Disp: , Rfl:    ergocalciferol (VITAMIN D2) 1.25 MG (50000 UT) capsule, Take 1 capsule (50,000 Units total) by mouth once a week., Disp: 30 capsule, Rfl: 1   fluticasone (FLONASE) 50 MCG/ACT nasal spray, Place 2 sprays into both nostrils in the morning and at bedtime., Disp: 16 g, Rfl: 5   ipratropium (ATROVENT) 0.06 % nasal spray, Place 2 sprays into both nostrils 4 (four) times daily. As needed for nasal congestion, runny nose, Disp: 15 mL, Rfl: 0   levothyroxine (SYNTHROID) 75 MCG tablet, Take 75 mcg by mouth daily before breakfast., Disp: , Rfl:    liothyronine (CYTOMEL) 5 MCG tablet, TAKE 2 TABLETS (=10MCG     TOTAL)     DAILY, Disp: 180 tablet, Rfl: 1   meloxicam (MOBIC) 15 MG tablet, Take 1 tablet by mouth as directed.,  Disp: , Rfl:   Allergies:  Allergies  Allergen Reactions   Bactrim [Sulfamethoxazole-Trimethoprim] Itching and Rash    Past Medical History, Surgical history, Social history, and Family History were reviewed and updated.  Review of Systems: Review of Systems  Constitutional:  Positive for fatigue.  HENT:   Positive for hearing loss and tinnitus.   Eyes:  Positive for eye problems.  Respiratory:  Positive for cough.   Cardiovascular: Negative.   Gastrointestinal:  Positive for abdominal pain and nausea.  Endocrine: Negative.   Genitourinary: Negative.    Musculoskeletal:  Positive for arthralgias and myalgias.  Skin: Negative.   Neurological:  Positive for light-headedness.  Hematological: Negative.   Psychiatric/Behavioral:  Negative.      Physical Exam:  height is '5\' 5"'  (1.651 m) and weight is 173 lb (78.5 kg). Her oral temperature is 98.5 F (36.9 C). Her blood pressure is 124/71 and her pulse is 88. Her respiration is 18 and oxygen saturation is 98%.   Wt Readings from Last 3 Encounters:  12/22/21 173 lb (78.5 kg)  12/15/21 174 lb (78.9 kg)  11/22/21 175 lb 3.2 oz (79.5 kg)    Physical Exam Vitals reviewed.  Constitutional:      Comments: She has had bilateral mastectomies.  Mastectomy scars are well-healed.  There might be some nodularity along the mastectomy site.  Again I have to believe this is scar tissue.  There is no erythema or warmth or swelling associated with these.  I cannot palpate any bilateral axillary lymph nodes.  HENT:     Head: Normocephalic and atraumatic.  Eyes:     Pupils: Pupils are equal, round, and reactive to light.  Cardiovascular:     Rate and Rhythm: Normal rate and regular rhythm.     Heart sounds: Normal heart sounds.  Pulmonary:     Effort: Pulmonary effort is normal.     Breath sounds: Normal breath sounds.  Abdominal:     General: Bowel sounds are normal.     Palpations: Abdomen is soft.  Musculoskeletal:        General: No tenderness or deformity. Normal range of motion.     Cervical back: Normal range of motion.  Lymphadenopathy:     Cervical: No cervical adenopathy.  Skin:    General: Skin is warm and dry.     Findings: No erythema or rash.  Neurological:     Mental Status: She is alert and oriented to person, place, and time.  Psychiatric:        Behavior: Behavior normal.        Thought Content: Thought content normal.        Judgment: Judgment normal.    Lab Results  Component Value Date   WBC 5.5 12/22/2021   HGB 14.5 12/22/2021   HCT 43.7 12/22/2021   MCV 86.4 12/22/2021   PLT 305 12/22/2021     Chemistry      Component Value Date/Time   NA 136 12/22/2021 1126   K 4.3 12/22/2021 1126   CL 101 12/22/2021 1126   CO2 27 12/22/2021 1126    BUN 7 12/22/2021 1126   CREATININE 0.73 12/22/2021 1126   CREATININE 0.66 09/12/2017 0747      Component Value Date/Time   CALCIUM 11.2 (H) 12/22/2021 1126   ALKPHOS 111 12/22/2021 1126   AST 18 12/22/2021 1126   ALT 15 12/22/2021 1126   BILITOT 0.5 12/22/2021 1126      Impression and Plan: Stephanie Chase is  is a very nice 67 year old postmenopausal female.  She is originally from [place].  She moved down to [facility].  She has 2 grandchildren already.  Her children live on the [place].  She has early stage breast cancer.  Unfortunately, it was HER-2 positive.  Because this, she required chemotherapy with Herceptin.  Because of the chemotherapy, she has had side effects.  So far, these side effects have not let up.  I am not sure if she will ever have a significant improvement in her quality of life.  Her change in cognition is really her biggest concern.  She is worried about this deteriorating further and may be developing some dementia.  Thankfully, everything looks pretty stable right now.  I will certainly try to move her appointment out a little bit longer.  We will try to get her back between Thanksgiving and Christmas.     [name], MD 9/28/20231:12 PM

## 2021-12-23 ENCOUNTER — Telehealth: Payer: Self-pay

## 2021-12-23 LAB — CANCER ANTIGEN 27.29: CA 27.29: 15.6 U/mL (ref 0.0–38.6)

## 2021-12-23 NOTE — Telephone Encounter (Signed)
-----   Message from [name], MD sent at 12/22/2021  4:50 PM EDT ----- Call and let her know that the vitamin B12 and the iron levels are okay.  Thanks.Stephanie Chase

## 2021-12-24 ENCOUNTER — Other Ambulatory Visit: Payer: Self-pay

## 2021-12-26 ENCOUNTER — Other Ambulatory Visit: Payer: Self-pay

## 2022-01-18 ENCOUNTER — Telehealth: Payer: Self-pay | Admitting: Family Medicine

## 2022-01-18 ENCOUNTER — Ambulatory Visit (INDEPENDENT_AMBULATORY_CARE_PROVIDER_SITE_OTHER): Payer: BC Managed Care – PPO | Admitting: Neurology

## 2022-01-18 ENCOUNTER — Encounter: Payer: Self-pay | Admitting: Neurology

## 2022-01-18 VITALS — BP 134/80 | HR 86 | Ht 65.0 in | Wt 175.0 lb

## 2022-01-18 DIAGNOSIS — R52 Pain, unspecified: Secondary | ICD-10-CM | POA: Diagnosis not present

## 2022-01-18 DIAGNOSIS — R519 Headache, unspecified: Secondary | ICD-10-CM

## 2022-01-18 DIAGNOSIS — G4710 Hypersomnia, unspecified: Secondary | ICD-10-CM

## 2022-01-18 HISTORY — DX: Headache, unspecified: R51.9

## 2022-01-18 HISTORY — DX: Hypersomnia, unspecified: G47.10

## 2022-01-18 NOTE — Telephone Encounter (Signed)
Pt states: -Would like a second opinion.  -Recently saw Dr. Krista Blue for headaches, choose to see this specialty provider because of previous treatment experience with hands. -today's experience was not good. Provider looked at a previous MRI, was not thorough and notes are inaccurate.  -Provider "blew off" and dismissed discussion about headaches since cancer treatment. -Provider prescribed a sleep study because pt has difficulty sleeping - pt states she told the provider the pain causes her difficulty to get comfortable and delays falling to sleep.   Pt requests: -referral for a second opinion. -Call for any needed follow up or questions.

## 2022-01-18 NOTE — Progress Notes (Signed)
Chief Complaint  Patient presents with   New Patient (Initial Visit)    Rm 14. Accompanied by Stephanie Chase. NX Stephanie Chase 2023/ Internal referral for new daily persistent headaches, brain fog and partially empty sella.      ASSESSMENT AND PLAN  Stephanie Chase is a 61 y.o. female   Constellation of complaints, in the setting of suboptimal control of depression anxiety, chronic insomnia  Diffuse body achy pain, moderate daily headaches,  Essentially normal neurological examinations,  Tolerating Cymbalta 60 mg now, but was not sure it was helping her  Snoring, frequent awakening, excessive daytime sleepiness, fatigue, narrow oropharyngeal space, at risk for obstructive sleep apnea, will refer to sleep study  I have offered trazodone for potential treatment of her insomnia, she declined,  Previously elevated ESR, repeat laboratory evaluation including ESR C-reactive protein ANA  Advise her to stop frequent over-the-counter NSAIDs/Tylenol use.     DIAGNOSTIC DATA (LABS, IMAGING, TESTING) - I reviewed patient records, labs, notes, testing and imaging myself where available.   MEDICAL HISTORY:  Stephanie Chase, a 61 year old female, seen in request by her oncologist Dr. Marin Chase, Stephanie Chase for evaluation of bilateral lower extremity paresthesia, diffuse body achy pain.  I reviewed and summarized the referring note. PMHX Hypothyrodism Asthma Right Breast cancer, s/p lumpectomy followed by chemotherapy. Discoid lupus  She had a history of infiltrating ductal carcinoma of right breast, status bilateral mastectomy in June 2021 followed by Taxol/Herceptin chemotherapy 12 weeks completed January 26, 2020, followed by Herceptin maintenance dosage completed on October 25, 2020  Shortly after starting chemotherapy, she noticed bilateral feet numbness tingling, which has been persistent since then, I saw her in March 2022 for worsening left hand symptoms, also complains of tension at her left shoulder,  elbow  EMG nerve conduction study that showed no large fiber peripheral neuropathy  She was given low-dose of Cymbalta 20 mg daily, she tolerated well, no benefit noted, she stopped taking it  Today she also complains of depression, tearful, she continued complaints of bilateral feet paresthesia, hard to be active, brain fog, frequent headaches, with light noise sensitivity,   Personally reviewed MRI of brain with without contrast in February 2022 there was no significant abnormality Recent MRI of the shoulder showed rotator cuff, pending right arthroscopic surgery  1. Distal supraspinatus and infraspinatus tendinosis with bursal sided fraying. No high-grade or retracted cuff tear. 2. Mild subacromial-subdeltoid bursitis. 3. Degenerative superior labral tearing anteriorly and posteriorly. 4. Mild AC joint arthropathy. 5. Streaky intramuscular edema within the deltoid mid fibers consistent with low-grade muscle strain.  I personally reviewed MRI of the lumbar in October 2022, mild degenerative changes, no significant canal foraminal narrowing  UPDATE Jan 18 2022: She did had right rotator cuff arthroscopic surgery on March 16th, complaints of limited range of motion following surgery, continued right shoulder pain radiating to right arm,  Today she is worried about constant daily headaches, 7/10, pressure, she has been taking daily Tylenol NSAIDs with limited health  She is not able to tolerating Cymbalta 60 mg daily  She continue complains of depression anxiety, wants to cry all the time, difficulty falling to sleep, then frequent awakening difficulty finding a comfortable position, husband reported snoring, excessive daytime sleepiness, fatigue, taking frequent naps she does has narrow oropharyngeal space  We personally reviewed MRI of the brain without contrast December 15, 2021, no acute abnormality, partial empty sella turcica, which was also present at previous MRI of the brain  February 2022  MRI of  cervical spine, mild degenerative changes no significant canal foraminal narrowing  Laboratory evaluations in 2023, normal B12, TSH, iron panel, vitamin D level, PTH, elevated ESR 51 on October 21, 2021  She has changed her primary care to Dr. Cherlynn Chase, Catalina Lunger, MD   PHYSICAL EXAM:   Vitals:   01/18/22 0922  BP: 134/80  Pulse: 86  Weight: 175 lb (79.4 kg)  Height: '5\' 5"'  (1.651 m)   Body mass index is 29.12 kg/m.  PHYSICAL EXAMNIATION:  Gen: NAD, conversant, well nourised, well groomed                     Cardiovascular: Regular rate rhythm, no peripheral edema, warm, nontender. Eyes: Conjunctivae clear without exudates or hemorrhage Neck: Supple, no carotid bruits. Pulmonary: Clear to auscultation bilaterally   NEUROLOGICAL EXAM:  MENTAL STATUS: Speech/cognition: Depressed looking middle-aged female, alert, oriented to history taking and casual conversation   CRANIAL NERVES: CN II: Visual fields are full to confrontation. Pupils are round equal and briskly reactive to light. CN III, IV, VI: extraocular movement are normal. No ptosis. CN V: Facial sensation is intact to light touch CN VII: Face is symmetric with normal eye closure  CN VIII: Hearing is normal to causal conversation. CN IX, X: Phonation is normal. CN XI: Head turning and shoulder shrug are intact CN XII: Narrow oropharyngeal space  MOTOR: There is no pronator drift of out-stretched arms. Muscle bulk and tone are normal. Muscle strength is normal.  REFLEXES: Reflexes are 2+ and symmetric at the biceps, triceps, knees, and absent at ankles. Plantar responses are flexor.  SENSORY: Mildly length dependent decreased light touch, pinprick to ankle level  COORDINATION: There is no trunk or limb dysmetria noted.  GAIT/STANCE: Push up to get up from seated position, steady  REVIEW OF SYSTEMS:  Full 14 system review of systems performed and notable only for as above All other review of  systems were negative.   ALLERGIES: Allergies  Allergen Reactions   Bactrim [Sulfamethoxazole-Trimethoprim] Itching and Rash    HOME MEDICATIONS: Current Outpatient Medications  Medication Sig Dispense Refill   acetaminophen (TYLENOL) 500 MG tablet Take 500-1,000 mg by mouth every 6 (six) hours as needed for moderate pain.      albuterol (VENTOLIN HFA) 108 (90 Base) MCG/ACT inhaler Inhale 2 puffs into the lungs every 6 (six) hours as needed for wheezing or shortness of breath. 8 g 2   BACLOFEN PO Baclofen     DULoxetine (CYMBALTA) 60 MG capsule      ergocalciferol (VITAMIN D2) 1.25 MG (50000 UT) capsule Take 1 capsule (50,000 Units total) by mouth once a week. 30 capsule 1   fluticasone (FLONASE) 50 MCG/ACT nasal spray Place 2 sprays into both nostrils in the morning and at bedtime. 16 g 5   ipratropium (ATROVENT) 0.06 % nasal spray Place 2 sprays into both nostrils 4 (four) times daily. As needed for nasal congestion, runny nose 15 mL 0   levothyroxine (SYNTHROID) 75 MCG tablet Take 75 mcg by mouth daily before breakfast.     liothyronine (CYTOMEL) 5 MCG tablet TAKE 2 TABLETS (=10MCG     TOTAL)     DAILY 180 tablet 1   meloxicam (MOBIC) 15 MG tablet Take 1 tablet by mouth as directed.     No current facility-administered medications for this visit.    PAST MEDICAL HISTORY: Past Medical History:  Diagnosis Date   Allergy Seasonal   Anemia    Anxiety  Asthma    Breast cancer ([place])    right, 05/13/20 completed Taxol, on Herceptin   Cervical paraspinous muscle spasm    COPD (chronic obstructive pulmonary disease) (HCC)    emphysema   Depression    Discoid lupus    Discoid lupus    Emphysema of lung ([place])    Family history of bladder cancer    Family history of BRCA gene mutation    Family history of breast cancer    Family history of prostate cancer    GERD (gastroesophageal reflux disease)    Hypercholesteremia    Hypothyroidism    Neuromuscular disorder ([place])     Neuropathy    Osteoporosis    Pneumonia    Thyroid disease     PAST SURGICAL HISTORY: Past Surgical History:  Procedure Laterality Date   ABLATION ON ENDOMETRIOSIS  2458   APPLICATION OF A-CELL OF EXTREMITY Right 10/22/2019   Procedure: EXCISION OF RIGHT BREAST WOUND WITH PRIMARY CLOSURE;  Surgeon: [name] Going, DO;  Location: [facility];  Service: Plastics;  Laterality: Right;   BREAST LUMPECTOMY     DEBRIDEMENT AND CLOSURE WOUND Right 10/22/2019   Procedure: Excision of right breast wound;  Surgeon: [name] Going, DO;  Location: [facility];  Service: Plastics;  Laterality: Right;   MASTECTOMY W/ SENTINEL NODE BIOPSY Bilateral 09/05/2019   Procedure: BILATERAL MASTECTOMY WITH RIGHT SENTINEL LYMPH NODE BIOPSY;  Surgeon: Stark Klein, MD;  Location: [facility];  Service: General;  Laterality: Bilateral;  COMBINED WITH REGIONAL FOR POST OP PAIN   PORTACATH PLACEMENT Left 09/05/2019   Procedure: INSERTION PORT-A-CATH WITH ULTRASOUND GUIDANCE;  Surgeon: Stark Klein, MD;  Location: [facility];  Service: General;  Laterality: Left;   ROTATOR CUFF REPAIR Right 06/09/2021   TONSILLECTOMY     TUBAL LIGATION     WISDOM TOOTH EXTRACTION      FAMILY HISTORY: Family History  Problem Relation Age of Onset   Breast cancer Mother 70   Diabetes Mother    Bladder Cancer Mother 34       ureteral cancer   Cancer Mother        blood cancer   Asthma Mother    Depression Mother    Hearing loss Mother    Kidney disease Mother    Vision loss Mother    Heart attack Father    Hyperlipidemia Father    Hypertension Father    Alzheimer's disease Father    Prostate cancer Father        dx. >50   Cancer Father    COPD Father    Hearing loss Father    Heart disease Father    Cancer Maternal Uncle        prostate or colon   BRCA 1/2 Daughter    Cancer Cousin        unknown type; dx. in her 69s (maternal cousin)   Alzheimer's disease Other    Breast cancer Other 73       maternal great-aunt    Cancer Maternal Aunt    Cancer Paternal Uncle     SOCIAL HISTORY: Social History   Socioeconomic History   Marital status: Divorced    Spouse name: Not on file   Number of children: 2   Years of education: Not on file   Highest education level: Some college, no degree  Occupational History   Not on file  Tobacco Use   Smoking status: Every Day    Packs/day: 1.00  Years: 50.00    Total pack years: 50.00    Types: Cigarettes    Start date: 32   Smokeless tobacco: Never  Vaping Use   Vaping Use: Never used  Substance and Sexual Activity   Alcohol use: Yes    Comment: Couple times a year   Drug use: No   Sexual activity: Yes    Partners: Male    Birth control/protection: None    Comment: BTL and ablation  Other Topics Concern   Not on file  Social History Narrative   Disabled-commercial lending assist   Social Determinants of Health   Financial Resource Strain: Not on file  Food Insecurity: Not on file  Transportation Needs: Not on file  Physical Activity: Not on file  Stress: Not on file  Social Connections: Not on file  Intimate Partner Violence: Not on file      Marcial Pacas, M.D. Ph.D.  [facility] [address], [place], [place] [address] Ph: [phone] Fax: [phone]  CC:  Tawnya Crook, MD [place],  [place] [address]  Tawnya Crook, MD

## 2022-01-18 NOTE — Telephone Encounter (Signed)
Please advise 

## 2022-01-19 LAB — CK: Total CK: 69 U/L (ref 32–182)

## 2022-01-19 LAB — C-REACTIVE PROTEIN: CRP: 3 mg/L (ref 0–10)

## 2022-01-19 LAB — ANA W/REFLEX IF POSITIVE: Anti Nuclear Antibody (ANA): NEGATIVE

## 2022-01-19 LAB — SEDIMENTATION RATE: Sed Rate: 7 mm/hr (ref 0–40)

## 2022-01-20 ENCOUNTER — Encounter: Payer: Self-pay | Admitting: Neurology

## 2022-01-20 ENCOUNTER — Other Ambulatory Visit: Payer: Self-pay | Admitting: *Deleted

## 2022-01-20 DIAGNOSIS — G4452 New daily persistent headache (NDPH): Secondary | ICD-10-CM

## 2022-01-20 NOTE — Telephone Encounter (Signed)
Referral placed.

## 2022-01-26 ENCOUNTER — Encounter: Payer: Self-pay | Admitting: Family Medicine

## 2022-01-26 ENCOUNTER — Ambulatory Visit (INDEPENDENT_AMBULATORY_CARE_PROVIDER_SITE_OTHER): Payer: BC Managed Care – PPO | Admitting: Family Medicine

## 2022-01-26 VITALS — BP 114/70 | HR 98 | Temp 97.9°F | Ht 65.0 in | Wt 175.2 lb

## 2022-01-26 DIAGNOSIS — G44221 Chronic tension-type headache, intractable: Secondary | ICD-10-CM | POA: Diagnosis not present

## 2022-01-26 DIAGNOSIS — R52 Pain, unspecified: Secondary | ICD-10-CM | POA: Diagnosis not present

## 2022-01-26 DIAGNOSIS — R5383 Other fatigue: Secondary | ICD-10-CM

## 2022-01-26 DIAGNOSIS — H539 Unspecified visual disturbance: Secondary | ICD-10-CM | POA: Diagnosis not present

## 2022-01-26 DIAGNOSIS — M2550 Pain in unspecified joint: Secondary | ICD-10-CM

## 2022-01-26 DIAGNOSIS — Z170 Estrogen receptor positive status [ER+]: Secondary | ICD-10-CM

## 2022-01-26 DIAGNOSIS — J432 Centrilobular emphysema: Secondary | ICD-10-CM

## 2022-01-26 DIAGNOSIS — F4323 Adjustment disorder with mixed anxiety and depressed mood: Secondary | ICD-10-CM

## 2022-01-26 DIAGNOSIS — R413 Other amnesia: Secondary | ICD-10-CM

## 2022-01-26 DIAGNOSIS — C50411 Malignant neoplasm of upper-outer quadrant of right female breast: Secondary | ICD-10-CM

## 2022-01-26 MED ORDER — ALBUTEROL SULFATE HFA 108 (90 BASE) MCG/ACT IN AERS: 2.0000 | INHALATION_SPRAY | Freq: Four times a day (QID) | RESPIRATORY_TRACT | 5 refills | Status: DC | PRN

## 2022-01-26 MED ORDER — GABAPENTIN 100 MG PO CAPS: 100.0000 mg | ORAL_CAPSULE | Freq: Every day | ORAL | 1 refills | Status: DC

## 2022-01-26 NOTE — Progress Notes (Signed)
Subjective:     Patient ID: Stephanie Chase, female    DOB: 1960/07/23, 61 y.o.   MRN: [id number]  Chief Complaint  Patient presents with   Follow-up   Headache    HPI  HA-continues.  Neuro appt. Per pt, notes don't match what pt heard.  Only takes ibu or tylenol once/day.  2.  Fatigue-terrible.  Hard to sleep d/t pain-falling and staying asleep.  No gasping for air. Will see another neuro next wk.   3.  Chronic pain all over.  Sees Ortho. Referred to Dr. Kathreen Devoid injection 11/21.  C5-6-nerve/disc.  PT as well. Burning pain rad down R>L arm.  Lumbar spine done 10/19-told fissure L4-5 4. COPD/emphysema on CT.  Smoker-since 2nd grade.  Uses inhaler occ.  5  moods-cymbalta helps.  Doesn't help pain. Seeing MH.  Sees tomorrow. A lot of anxiety this wk.  6.  Memory-getting more forgetful and confused.  One day driving and got confused which way to go.  7.  Vision in L eye problems(white's out at times) so saw [facility]-?TIA,?carotid doppler/EKG 8.  Breast ca-s/p B mast, chemo.  Concerned as mom had as well and mets to blood and "everywhere".    Health Maintenance Due  Topic Date Due   Lung Cancer Screening  Never done    Past Medical History:  Diagnosis Date   Allergy Seasonal   Anemia    Anxiety    Asthma    Breast cancer ([place])    right, 05/13/20 completed Taxol, on Herceptin   Cervical paraspinous muscle spasm    COPD (chronic obstructive pulmonary disease) (HCC)    emphysema   Depression    Discoid lupus    Discoid lupus    Emphysema of lung ([place])    Family history of bladder cancer    Family history of BRCA gene mutation    Family history of breast cancer    Family history of prostate cancer    GERD (gastroesophageal reflux disease)    Hypercholesteremia    Hypothyroidism    Neuromuscular disorder ([place])    Neuropathy    Osteoporosis    Pneumonia    Thyroid disease     Past Surgical History:  Procedure Laterality Date   ABLATION ON ENDOMETRIOSIS  7867    APPLICATION OF A-CELL OF EXTREMITY Right 10/22/2019   Procedure: EXCISION OF RIGHT BREAST WOUND WITH PRIMARY CLOSURE;  Surgeon: [name] Going, DO;  Location: [facility];  Service: Plastics;  Laterality: Right;   BREAST LUMPECTOMY     DEBRIDEMENT AND CLOSURE WOUND Right 10/22/2019   Procedure: Excision of right breast wound;  Surgeon: [name] Going, DO;  Location: [facility];  Service: Plastics;  Laterality: Right;   MASTECTOMY W/ SENTINEL NODE BIOPSY Bilateral 09/05/2019   Procedure: BILATERAL MASTECTOMY WITH RIGHT SENTINEL LYMPH NODE BIOPSY;  Surgeon: Stark Klein, MD;  Location: [facility];  Service: General;  Laterality: Bilateral;  COMBINED WITH REGIONAL FOR POST OP PAIN   PORTACATH PLACEMENT Left 09/05/2019   Procedure: INSERTION PORT-A-CATH WITH ULTRASOUND GUIDANCE;  Surgeon: Stark Klein, MD;  Location: [facility];  Service: General;  Laterality: Left;   ROTATOR CUFF REPAIR Right 06/09/2021   TONSILLECTOMY     TUBAL LIGATION     WISDOM TOOTH EXTRACTION      Outpatient Medications Prior to Visit  Medication Sig Dispense Refill   acetaminophen (TYLENOL) 500 MG tablet Take 500-1,000 mg by mouth every 6 (six) hours as needed for moderate pain.  BACLOFEN PO Baclofen     DULoxetine (CYMBALTA) 60 MG capsule      ergocalciferol (VITAMIN D2) 1.25 MG (50000 UT) capsule Take 1 capsule (50,000 Units total) by mouth once a week. 30 capsule 1   fluticasone (FLONASE) 50 MCG/ACT nasal spray Place 2 sprays into both nostrils in the morning and at bedtime. 16 g 5   ipratropium (ATROVENT) 0.06 % nasal spray Place 2 sprays into both nostrils 4 (four) times daily. As needed for nasal congestion, runny nose 15 mL 0   levothyroxine (SYNTHROID) 75 MCG tablet Take 75 mcg by mouth daily before breakfast.     liothyronine (CYTOMEL) 5 MCG tablet TAKE 2 TABLETS (=10MCG     TOTAL)     DAILY 180 tablet 1   meloxicam (MOBIC) 15 MG tablet Take 1 tablet by mouth as directed.     albuterol (VENTOLIN HFA) 108 (90  Base) MCG/ACT inhaler Inhale 2 puffs into the lungs every 6 (six) hours as needed for wheezing or shortness of breath. 8 g 2   No facility-administered medications prior to visit.    Allergies  Allergen Reactions   Bactrim [Sulfamethoxazole-Trimethoprim] Itching and Rash   ROS neg/noncontributory except as noted HPI/below At times/pain/burning in chest.  Occ lumps in tissue left in chest.      Objective:     BP 114/70 (BP Location: Left Arm, Patient Position: Sitting)   Pulse 98   Temp 97.9 F (36.6 C) (Temporal)   Ht _0  (1.651 m)   Wt 175 lb 3.2 oz (79.5 kg)   SpO2 98%   BMI 29.15 kg/m  Wt Readings from Last 3 Encounters:  01/26/22 175 lb 3.2 oz (79.5 kg)  01/18/22 175 lb (79.4 kg)  12/22/21 173 lb (78.5 kg)    Physical Exam   Gen: WDWN NAD HEENT: NCAT, conjunctiva not injected, sclera nonicteric NECK:  no carotid bruits CARDIAC: RRR, S1S2+, no murmur.  MSK: no gross abnormalities.  NEURO: A&O x3.  CN II-XII intact.  PSYCH: normal mood. Good eye contact.  vitiligo  Reviewed MRI's, CT's, notes. Listened to pt.  Came up w/plan, etc so 70mn w/pt    Assessment & Plan:   Problem List Items Addressed This Visit       Respiratory   Pulmonary emphysema ([place]   Relevant Medications   albuterol (PROAIR HFA) 108 (90 Base) MCG/ACT inhaler     Other   Malignant neoplasm of upper-outer quadrant of right breast in female, estrogen receptor positive (HCC)   Polyarthralgia   Body aches   Intractable headache   Relevant Medications   gabapentin (NEURONTIN) 100 MG capsule   Other Visit Diagnoses     Visual changes    -  Primary   Relevant Orders   [place] Duplex Bilateral   Other fatigue       Adjustment disorder with mixed anxiety and depressed mood       Memory deficit          HA-chronic daily-intractable.  Has appt next wk w/neuro.   Fatigue-?brain fog-?fibromyalgia, chemo brain, pain, other.   Will add gabapentin 1065m300mg at hs to see if will  improve.  Seeing neuro next wk as well. Chronic pain/arth/myalgia-mixed picture-some fibromyalgia, some pathology(C cpine discs/Lumbar).  Seeing ortho.  Will be getting injection few wks.   Will start gabapentin at HS, may need to increase.  F/u next month COPD-discussed scans.  Continue albuterol prn.  Stop smoking. Adjustment disorder-anxiety/depression-on cymbalta-some help.  Sees MH.  Memory issues-?fibro,chemo,other-seeing neuro soon. Visual changes-saw ophth-? If TIA's.  Has done MRI and echo.   Will check carotid dopplers. Breast CA-s/p B mast,chemo.  Followed by onc.   Meds ordered this encounter  Medications   gabapentin (NEURONTIN) 100 MG capsule    Sig: Take 1 capsule (100 mg total) by mouth at bedtime. Increase in 5-7 days to 2 and then to 3 if tolerate    Dispense:  90 capsule    Refill:  1   albuterol (PROAIR HFA) 108 (90 Base) MCG/ACT inhaler    Sig: Inhale 2 puffs into the lungs every 6 (six) hours as needed for wheezing or shortness of breath.    Dispense:  1 each    Refill:  [place], MD

## 2022-01-26 NOTE — Patient Instructions (Signed)
It was very nice to see you today!  Pain-tumeric caps/ginger caps. Magnesium 200-'400mg'$ /d.    PLEASE NOTE:  If you had any lab tests please let us know if you have not heard back within a few days. You may see your results on MyChart before we have a chance to review them but we will give you a call once they are reviewed by [place]. If we ordered any referrals today, please let us know if you have not heard from their office within the next week.   Please try these tips to maintain a healthy lifestyle:  Eat most of your calories during the day when you are active. Eliminate processed foods including packaged sweets (pies, cakes, cookies), reduce intake of potatoes, white bread, white pasta, and white rice. Look for whole grain options, oat flour or almond flour.  Each meal should contain half fruits/vegetables, one quarter protein, and one quarter carbs (no bigger than a computer mouse).  Cut down on sweet beverages. This includes juice, soda, and sweet tea. Also watch fruit intake, though this is a healthier sweet option, it still contains natural sugar! Limit to 3 servings daily.  Drink at least 1 glass of water with each meal and aim for at least 8 glasses per day  Exercise at least 150 minutes every week.

## 2022-01-31 NOTE — Progress Notes (Unsigned)
NEUROLOGY CONSULTATION NOTE  Stephanie Chase MRN: [id number] DOB: April 08, 1960  Referring provider: Tawnya Crook, MD Primary care provider: Tawnya Crook, MD  Reason for consult:  headache, memory deficits, visual changes  Assessment/Plan:   Chronic daily headaches - tension-type complicated by medication-overuse Memory deficits - associated with prior chemotherapy.  Concern due to family history of Alzheimer's disease Brief visual phenomena - unclear etiology.  Very brief to be consistent with migraine aura Chronic pain syndrome   Due to history of side effects, she would like to avoid medications.  For treatment of headaches, she would like to continue to see how she does no the supplements and vitamins.  In addition, I recommended B2 and CoQ10.  Other considerations include biofeedback or cognitive behavioral therapy.  She will contact me if she wishes to start a preventative medication, nortriptyline 12m at bedtime Limit use of pain relievers to no more than 2 days out of week to prevent risk of rebound or medication-overuse headache. Keep headache diary Neuropsychological evaluation Follow up 4-5 months    Subjective:  Stephanie NORWOODis a 89year old right-handed female with discoid lupus, hypothyroidism, asthma and history of right breast cancer s/p lumpectomy and chemotherapy who presents for headaches, memory deficits, paresthesias and pain.  History supplemented by prior neurologist's and referring provider's notes.  She began experiencing multiple symptomatology after starting chemotherapy in 2021 for breast cancer.    She has daily headaches, occipital/top of head, pressure/throbbing.  Tingling on her scalp.  No associated nausea, vomiting, photophobia, phonophobia, visual disturbance.  Lasts from 1 to 3 hours.  Takes Tylenol or Advil about 4 days a week.  Sometimes she sees a spot or white-out of vision in her left eye lasting no more than a minute.  Occurs 3 to  4 times a day.  Cannot say if associated with the headaches.    She endorses memory problems.  Usually short term.  Recently, she got disoriented while driving on a familiar route, which scared her.  She started using a GPS everywhere.  Endorses brain fog.  TSH and B12 in September 2023 were 0.501 and 263 respectively.  Iron studies and vitamin D level were normal.  She is concerned because her father had Alzheimer's disease.  MRI of brain with and without contrast on 12/14/2021 personally reviewed showed partially empty sella (as previously seen on MRI from 05/20/2020) but otherwise unremarkable.  She has had eye exams that were unremarkable.    She reports numbness and tingling in the feet that started after starting chemotherapy in 2021 to treat breast cancer.  She began having symptoms in the hands as well.  She had a NCV-EMG of left upper and lower extremities on 06/02/2020 which was unremarkable.  She has generalized pain and muscle spasms, particularly in the right shoulder.  Imaging revealed supaspinatus and infraspinatus tendinosis, low-grade muscle strain and degenerative changes.  Underwent right rotator cuff arthroscopic surgery on 06/09/2021.  Still with right shoulder pain radiating into arm.  Labs from October 2023 revealed negative ANA, sed rate 7, CRP 3 and CK 69.  MRI of cervical spine on 12/14/2021 personally reviewed showed cervical spondylosis with shallow disc protrusions at C4-5 and C5-6 causing moderate to severe right neural foraminal narrowing at C5-6 but no signficant spinal canal stenosis.   Patient endorses depression and easily tearful.  Difficulty falling asleep due to pain and therefore often doesn't have a full night's sleep.  Current Antidepressant medications:  duloxetine 668m  daily Current Anticonvulsant medications:  Prescribed gabapentin but hasn't started yet.  Current Vitamins/Herbal/Supplements:  Magnesium, turmeric, ginger, Multi-B vitaminD Current  Antihistamines/Decongestants:  Flonase Other medications:  Levothyroxine      PAST MEDICAL HISTORY: Past Medical History:  Diagnosis Date   Allergy Seasonal   Anemia    Anxiety    Asthma    Breast cancer ([facility] [facility])    right, 05/13/20 completed Taxol, on Herceptin   Cervical paraspinous muscle spasm    COPD (chronic obstructive pulmonary disease) ([facility])    emphysema   Depression    Discoid lupus    Discoid lupus    Emphysema of lung ([place])    Family history of bladder cancer    Family history of BRCA gene mutation    Family history of breast cancer    Family history of prostate cancer    GERD (gastroesophageal reflux disease)    Hypercholesteremia    Hypothyroidism    Neuromuscular disorder ([place])    Neuropathy    Osteoporosis    Pneumonia    Thyroid disease     PAST SURGICAL HISTORY: Past Surgical History:  Procedure Laterality Date   ABLATION ON ENDOMETRIOSIS  6767   APPLICATION OF A-CELL OF EXTREMITY Right 10/22/2019   Procedure: EXCISION OF RIGHT BREAST WOUND WITH PRIMARY CLOSURE;  Surgeon: [name] Going, DO;  Location: [facility];  Service: Plastics;  Laterality: Right;   BREAST LUMPECTOMY     DEBRIDEMENT AND CLOSURE WOUND Right 10/22/2019   Procedure: Excision of right breast wound;  Surgeon: [name] Going, DO;  Location: [facility];  Service: Plastics;  Laterality: Right;   MASTECTOMY W/ SENTINEL NODE BIOPSY Bilateral 09/05/2019   Procedure: BILATERAL MASTECTOMY WITH RIGHT SENTINEL LYMPH NODE BIOPSY;  Surgeon: Stark Klein, MD;  Location: [facility];  Service: General;  Laterality: Bilateral;  COMBINED WITH REGIONAL FOR POST OP PAIN   PORTACATH PLACEMENT Left 09/05/2019   Procedure: INSERTION PORT-A-CATH WITH ULTRASOUND GUIDANCE;  Surgeon: Stark Klein, MD;  Location: [facility];  Service: General;  Laterality: Left;   ROTATOR CUFF REPAIR Right 06/09/2021   TONSILLECTOMY     TUBAL LIGATION     WISDOM TOOTH EXTRACTION      MEDICATIONS: Current Outpatient Medications on  File Prior to Visit  Medication Sig Dispense Refill   acetaminophen (TYLENOL) 500 MG tablet Take 500-1,000 mg by mouth every 6 (six) hours as needed for moderate pain.      albuterol (PROAIR HFA) 108 (90 Base) MCG/ACT inhaler Inhale 2 puffs into the lungs every 6 (six) hours as needed for wheezing or shortness of breath. 1 each 5   BACLOFEN PO Baclofen     DULoxetine (CYMBALTA) 60 MG capsule      ergocalciferol (VITAMIN D2) 1.25 MG (50000 UT) capsule Take 1 capsule (50,000 Units total) by mouth once a week. 30 capsule 1   fluticasone (FLONASE) 50 MCG/ACT nasal spray Place 2 sprays into both nostrils in the morning and at bedtime. 16 g 5   gabapentin (NEURONTIN) 100 MG capsule Take 1 capsule (100 mg total) by mouth at bedtime. Increase in 5-7 days to 2 and then to 3 if tolerate 90 capsule 1   ipratropium (ATROVENT) 0.06 % nasal spray Place 2 sprays into both nostrils 4 (four) times daily. As needed for nasal congestion, runny nose 15 mL 0   levothyroxine (SYNTHROID) 75 MCG tablet Take 75 mcg by mouth daily before breakfast.     liothyronine (CYTOMEL) 5 MCG tablet TAKE 2 TABLETS (=10MCG  TOTAL)     DAILY 180 tablet 1   meloxicam (MOBIC) 15 MG tablet Take 1 tablet by mouth as directed.     No current facility-administered medications on file prior to visit.    ALLERGIES: Allergies  Allergen Reactions   Bactrim [Sulfamethoxazole-Trimethoprim] Itching and Rash    FAMILY HISTORY: Family History  Problem Relation Age of Onset   Breast cancer Mother 10   Diabetes Mother    Bladder Cancer Mother 76       ureteral cancer   Cancer Mother        blood cancer   Asthma Mother    Depression Mother    Hearing loss Mother    Kidney disease Mother    Vision loss Mother    Heart attack Father    Hyperlipidemia Father    Hypertension Father    Alzheimer's disease Father    Prostate cancer Father        dx. >62   Cancer Father    COPD Father    Hearing loss Father    Heart disease Father     Cancer Maternal Uncle        prostate or colon   BRCA 1/2 Daughter    Cancer Cousin        unknown type; dx. in her 75s (maternal cousin)   Alzheimer's disease Other    Breast cancer Other 65       maternal great-aunt   Cancer Maternal Aunt    Cancer Paternal Uncle     Objective:  Blood pressure 116/81, pulse 88, height _0  (1.651 m), weight 177 lb (80.3 kg), SpO2 96 %. General: No acute distress.  Patient appears well-groomed.   Head:  Normocephalic/atraumatic Eyes:  fundi examined but not visualized Neck: supple, paraspinal tenderness, full range of motion Back: paraspinal tenderness Heart: regular rate and rhythm Lungs: Clear to auscultation bilaterally. Vascular: No carotid bruits. Neurological Exam: Mental status: alert and oriented to person, place, and time, speech fluent and not dysarthric, language intact. Cranial nerves: CN I: not tested CN II: pupils equal, round and reactive to light, visual fields intact CN III, IV, VI:  full range of motion, no nystagmus, no ptosis CN V: facial sensation intact. CN VII: upper and lower face symmetric CN VIII: hearing intact CN IX, X: gag intact, uvula midline CN XI: sternocleidomastoid and trapezius muscles intact CN XII: tongue midline Bulk & Tone: normal, no fasciculations. Motor:  muscle strength 5/5 throughout Sensation:  Pinprick, temperature and vibratory sensation intact. Deep Tendon Reflexes:  2+ throughout,  toes downgoing.   Finger to nose testing:  Without dysmetria.   Heel to shin:  Without dysmetria.   Gait:  Normal station and stride.  Romberg negative.    Thank you for allowing me to take part in the care of this patient.  Metta Clines, DO  CC: Tawnya Crook, MD

## 2022-02-01 ENCOUNTER — Encounter: Payer: Self-pay | Admitting: Neurology

## 2022-02-01 ENCOUNTER — Ambulatory Visit (INDEPENDENT_AMBULATORY_CARE_PROVIDER_SITE_OTHER): Payer: BC Managed Care – PPO | Admitting: Neurology

## 2022-02-01 VITALS — BP 116/81 | HR 88 | Ht 65.0 in | Wt 177.0 lb

## 2022-02-01 DIAGNOSIS — G894 Chronic pain syndrome: Secondary | ICD-10-CM | POA: Diagnosis not present

## 2022-02-01 DIAGNOSIS — R519 Headache, unspecified: Secondary | ICD-10-CM

## 2022-02-01 DIAGNOSIS — R413 Other amnesia: Secondary | ICD-10-CM

## 2022-02-01 NOTE — Patient Instructions (Signed)
Limit use of all pain relievers (Tylenol, Advil, etc) to no more than 2 DAYS out of the week to prevent rebound headache Stop all caffeine intake In addition to magnesium, turmeric and ginger, consider riboflavin (B2) '400mg'$  daily and Coenzyme Q10 '300mg'$  daily Will order neuropsychological evaluation Follow up 4-5 months.

## 2022-02-06 ENCOUNTER — Ambulatory Visit
Admission: RE | Admit: 2022-02-06 | Discharge: 2022-02-06 | Disposition: A | Discharge status: Home / Self Care | Payer: BC Managed Care – PPO | Source: Ambulatory Visit | Attending: Family Medicine | Admitting: Family Medicine

## 2022-02-06 DIAGNOSIS — H539 Unspecified visual disturbance: Secondary | ICD-10-CM

## 2022-02-08 ENCOUNTER — Telehealth: Payer: Self-pay | Admitting: Family Medicine

## 2022-02-08 NOTE — Telephone Encounter (Signed)
Form placed on provider's desk for review and signature.  

## 2022-02-08 NOTE — Telephone Encounter (Signed)
..  Type of form received: Long Term Disability  Additional comments:   Received by: Adonis Brook  Form should be Faxed to: [phone]  Form should be mailed to:    Is patient requesting call for pickup:   Form placed:  In provider's box  Attach charge sheet. yes  Individual made aware of 3-5 business day turn around (Y/N)?

## 2022-02-10 NOTE — Telephone Encounter (Signed)
Spoke with patient and she has an appointment on 02/27/22, will discuss need for paperwork at that visit.

## 2022-02-24 ENCOUNTER — Other Ambulatory Visit: Payer: BC Managed Care – PPO

## 2022-02-24 ENCOUNTER — Ambulatory Visit: Payer: BC Managed Care – PPO | Admitting: Hematology & Oncology

## 2022-02-27 ENCOUNTER — Ambulatory Visit (INDEPENDENT_AMBULATORY_CARE_PROVIDER_SITE_OTHER): Payer: BC Managed Care – PPO | Admitting: Family Medicine

## 2022-02-27 ENCOUNTER — Ambulatory Visit: Payer: BC Managed Care – PPO | Admitting: Family Medicine

## 2022-02-27 ENCOUNTER — Encounter: Payer: Self-pay | Admitting: Family Medicine

## 2022-02-27 ENCOUNTER — Other Ambulatory Visit: Payer: Self-pay

## 2022-02-27 ENCOUNTER — Inpatient Hospital Stay (HOSPITAL_BASED_OUTPATIENT_CLINIC_OR_DEPARTMENT_OTHER): Payer: BC Managed Care – PPO | Admitting: Hematology & Oncology

## 2022-02-27 ENCOUNTER — Encounter: Payer: Self-pay | Admitting: Hematology & Oncology

## 2022-02-27 ENCOUNTER — Inpatient Hospital Stay: Payer: BC Managed Care – PPO | Attending: Hematology

## 2022-02-27 VITALS — BP 124/82 | HR 87 | Temp 97.5°F | Ht 65.0 in | Wt 176.1 lb

## 2022-02-27 VITALS — BP 114/78 | HR 83 | Temp 98.3°F | Resp 16 | Ht 65.0 in | Wt 177.0 lb

## 2022-02-27 DIAGNOSIS — C50411 Malignant neoplasm of upper-outer quadrant of right female breast: Secondary | ICD-10-CM

## 2022-02-27 DIAGNOSIS — Z79890 Hormone replacement therapy (postmenopausal): Secondary | ICD-10-CM | POA: Diagnosis not present

## 2022-02-27 DIAGNOSIS — Z122 Encounter for screening for malignant neoplasm of respiratory organs: Secondary | ICD-10-CM | POA: Diagnosis not present

## 2022-02-27 DIAGNOSIS — M810 Age-related osteoporosis without current pathological fracture: Secondary | ICD-10-CM | POA: Insufficient documentation

## 2022-02-27 DIAGNOSIS — G4710 Hypersomnia, unspecified: Secondary | ICD-10-CM | POA: Diagnosis not present

## 2022-02-27 DIAGNOSIS — E038 Other specified hypothyroidism: Secondary | ICD-10-CM

## 2022-02-27 DIAGNOSIS — Z08 Encounter for follow-up examination after completed treatment for malignant neoplasm: Secondary | ICD-10-CM | POA: Diagnosis present

## 2022-02-27 DIAGNOSIS — Z170 Estrogen receptor positive status [ER+]: Secondary | ICD-10-CM

## 2022-02-27 DIAGNOSIS — G44201 Tension-type headache, unspecified, intractable: Secondary | ICD-10-CM

## 2022-02-27 DIAGNOSIS — Z853 Personal history of malignant neoplasm of breast: Secondary | ICD-10-CM | POA: Diagnosis present

## 2022-02-27 DIAGNOSIS — M2550 Pain in unspecified joint: Secondary | ICD-10-CM | POA: Diagnosis not present

## 2022-02-27 DIAGNOSIS — Z79899 Other long term (current) drug therapy: Secondary | ICD-10-CM | POA: Diagnosis not present

## 2022-02-27 LAB — CMP (CANCER CENTER ONLY)
ALT: 13 U/L (ref 0–44)
AST: 18 U/L (ref 15–41)
Albumin: 4.5 g/dL (ref 3.5–5.0)
Alkaline Phosphatase: 98 U/L (ref 38–126)
Anion gap: 6 (ref 5–15)
BUN: 10 mg/dL (ref 6–20)
CO2: 29 mmol/L (ref 22–32)
Calcium: 11.3 mg/dL — ABNORMAL HIGH (ref 8.9–10.3)
Chloride: 103 mmol/L (ref 98–111)
Creatinine: 0.82 mg/dL (ref 0.44–1.00)
GFR, Estimated: >60 mL/min (ref >60)
Glucose, Bld: 100 mg/dL — ABNORMAL HIGH (ref 70–99)
Potassium: 4 mmol/L (ref 3.5–5.1)
Sodium: 138 mmol/L (ref 135–145)
Total Bilirubin: 0.5 mg/dL (ref 0.3–1.2)
Total Protein: 7.5 g/dL (ref 6.5–8.1)

## 2022-02-27 LAB — CBC WITH DIFFERENTIAL (CANCER CENTER ONLY)
Abs Immature Granulocytes: 0.03 10*3/uL (ref 0.00–0.07)
Basophils Absolute: 0 10*3/uL (ref 0.0–0.1)
Basophils Relative: 0 %
Eosinophils Absolute: 0 10*3/uL (ref 0.0–0.5)
Eosinophils Relative: 0 %
HCT: 45.7 % (ref 36.0–46.0)
Hemoglobin: 15 g/dL (ref 12.0–15.0)
Immature Granulocytes: 0 %
Lymphocytes Relative: 26 %
Lymphs Abs: 1.8 10*3/uL (ref 0.7–4.0)
MCH: 28.6 pg (ref 26.0–34.0)
MCHC: 32.8 g/dL (ref 30.0–36.0)
MCV: 87.2 fL (ref 80.0–100.0)
Monocytes Absolute: 0.4 10*3/uL (ref 0.1–1.0)
Monocytes Relative: 6 %
Neutro Abs: 4.6 10*3/uL (ref 1.7–7.7)
Neutrophils Relative %: 68 %
Platelet Count: 280 10*3/uL (ref 150–400)
RBC: 5.24 MIL/uL — ABNORMAL HIGH (ref 3.87–5.11)
RDW: 13.2 % (ref 11.5–15.5)
WBC Count: 6.9 10*3/uL (ref 4.0–10.5)
nRBC: 0 % (ref 0.0–0.2)

## 2022-02-27 LAB — LACTATE DEHYDROGENASE: LDH: 150 U/L (ref 98–192)

## 2022-02-27 NOTE — Progress Notes (Signed)
Hematology and Oncology Follow Up Visit  Stephanie Chase [id number] 01-Jun-1960 61 y.o. 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 6 months- next dose in 07/2022     Interim History:  [name] is back for follow-up.  She is doing okay.  She actually looks quite good today.  She did see Orthopedic Surgery.  She had some steroid injections into her neck.  Hopefully, this will help again when she has another injection.  She had a nice Thanksgiving.  She will stay here for Christmas.  She has had no nausea or vomiting.  She is still smoking.  She has had no change in bowel or bladder habits.  She has had no rashes.  There is been no leg swelling.  She has had no fever.  She has had a decent appetite.  There is no problems with nausea or vomiting.  She still gets fatigued quite easily.  There is still some issues with respect to her memory.  She just has a hard time concentrating.  Currently, I would say performance status is probably ECOG 2.   Medications:  Current Outpatient Medications:    tretinoin (RETIN-A) 0.025 % cream, Apply topically at bedtime., Disp: , Rfl:    triamcinolone cream (KENALOG) 0.1 %, Apply 1 Application topically 2 (two) times daily., Disp: , Rfl:    acetaminophen (TYLENOL) 500 MG tablet, Take 500-1,000 mg by mouth every 6 (six) hours as needed for moderate pain. , Disp: , Rfl:    albuterol (PROAIR HFA) 108 (90 Base) MCG/ACT inhaler, Inhale 2 puffs into the lungs every 6 (six) hours as needed for wheezing or shortness of breath., Disp: 1 each, Rfl: 5   DULoxetine (CYMBALTA) 60 MG capsule, , Disp: , Rfl:    ergocalciferol (VITAMIN D2) 1.25  MG (50000 UT) capsule, Take 1 capsule (50,000 Units total) by mouth once a week., Disp: 30 capsule, Rfl: 1   fluticasone (FLONASE) 50 MCG/ACT nasal spray, Place 2 sprays into both nostrils in the morning and at bedtime., Disp: 16 g, Rfl: 5   gabapentin (NEURONTIN) 100 MG capsule, Take 1 capsule (100 mg total) by mouth at bedtime. Increase in 5-7 days to 2 and then to 3 if tolerate, Disp: 90 capsule, Rfl: 1   ipratropium (ATROVENT) 0.06 % nasal spray, Place 2 sprays into both nostrils 4 (four) times daily. As needed for nasal congestion, runny nose, Disp: 15 mL, Rfl: 0   levothyroxine (SYNTHROID) 75 MCG tablet, Take 75 mcg by mouth daily before breakfast., Disp: , Rfl:    liothyronine (CYTOMEL) 5 MCG tablet, TAKE 2 TABLETS (=10MCG     TOTAL)     DAILY, Disp: 180 tablet, Rfl: 1   meloxicam (MOBIC) 15 MG tablet, Take 1 tablet by mouth as directed., Disp: , Rfl:   Allergies:  Allergies  Allergen Reactions   Bactrim [Sulfamethoxazole-Trimethoprim] Itching and Rash    Past Medical History, Surgical history, Social history, and Family History were reviewed and updated.  Review of Systems: Review of Systems  Constitutional:  Positive for fatigue.  HENT:   Positive for hearing loss and tinnitus.   Eyes:  Positive for eye problems.  Respiratory:  Positive for cough.   Cardiovascular: Negative.  Gastrointestinal:  Positive for abdominal pain and nausea.  Endocrine: Negative.   Genitourinary: Negative.    Musculoskeletal:  Positive for arthralgias and myalgias.  Skin: Negative.   Neurological:  Positive for light-headedness.  Hematological: Negative.   Psychiatric/Behavioral: Negative.      Physical Exam:  height is _0  (1.651 m) and weight is 177 lb (80.3 kg). Her oral temperature is 98.3 F (36.8 C). Her blood pressure is 114/78 and her pulse is 83. Her respiration is 16 and oxygen saturation is 99%.   Wt Readings from Last 3 Encounters:  02/27/22 177 lb (80.3 kg)  02/01/22 177 lb  (80.3 kg)  01/26/22 175 lb 3.2 oz (79.5 kg)    Physical Exam Vitals reviewed.  Constitutional:      Comments: She has had bilateral mastectomies.  Mastectomy scars are well-healed.  There might be some nodularity along the mastectomy site.  Again I have to believe this is scar tissue.  There is no erythema or warmth or swelling associated with these.  I cannot palpate any bilateral axillary lymph nodes.  HENT:     Head: Normocephalic and atraumatic.  Eyes:     Pupils: Pupils are equal, round, and reactive to light.  Cardiovascular:     Rate and Rhythm: Normal rate and regular rhythm.     Heart sounds: Normal heart sounds.  Pulmonary:     Effort: Pulmonary effort is normal.     Breath sounds: Normal breath sounds.  Abdominal:     General: Bowel sounds are normal.     Palpations: Abdomen is soft.  Musculoskeletal:        General: No tenderness or deformity. Normal range of motion.     Cervical back: Normal range of motion.  Lymphadenopathy:     Cervical: No cervical adenopathy.  Skin:    General: Skin is warm and dry.     Findings: No erythema or rash.  Neurological:     Mental Status: She is alert and oriented to person, place, and time.  Psychiatric:        Behavior: Behavior normal.        Thought Content: Thought content normal.        Judgment: Judgment normal.    Lab Results  Component Value Date   WBC 6.9 02/27/2022   HGB 15.0 02/27/2022   HCT 45.7 02/27/2022   MCV 87.2 02/27/2022   PLT 280 02/27/2022     Chemistry      Component Value Date/Time   NA 138 02/27/2022 0929   K 4.0 02/27/2022 0929   CL 103 02/27/2022 0929   CO2 29 02/27/2022 0929   BUN 10 02/27/2022 0929   CREATININE 0.82 02/27/2022 0929   CREATININE 0.66 09/12/2017 0747      Component Value Date/Time   CALCIUM 11.3 (H) 02/27/2022 0929   ALKPHOS 98 02/27/2022 0929   AST 18 02/27/2022 0929   ALT 13 02/27/2022 0929   BILITOT 0.5 02/27/2022 0929      Impression and Plan: [name] is  is a very nice 39 year old postmenopausal female.  She is originally from [place].  She moved down to [facility].  She has 2 grandchildren already.  Her children live on the [place].  She has early stage breast cancer.  Unfortunately, it was HER-2 positive.  Because this, she required chemotherapy with Herceptin.  Because of the chemotherapy, she has had side effects.  So far, these side effects have not let up.  I  am not sure if she will ever have a significant improvement in her quality of life.   I am glad that her neck is doing little bit better.  Hopefully, her memory issues will improve.  I do not know if she has to be referred to Neuropsychology.  I would think that her family doctor would be the one who would initiate this if needed.  We will plan to get her back now in 2 months.  Will try to get her through some of Winter.     [name], MD 12/4/202310:17 AM

## 2022-02-27 NOTE — Patient Instructions (Addendum)
It was very nice to see you today!  Happy Holidays!  Stop artificial sweeteners.  Use Poss Stevia or monk fruit. Don't over do the sugar.   Try gabapentin at bedtime.   PLEASE NOTE:  If you had any lab tests please let us know if you have not heard back within a few days. You may see your results on MyChart before we have a chance to review them but we will give you a call once they are reviewed by [place]. If we ordered any referrals today, please let us know if you have not heard from their office within the next week.   Please try these tips to maintain a healthy lifestyle:  Eat most of your calories during the day when you are active. Eliminate processed foods including packaged sweets (pies, cakes, cookies), reduce intake of potatoes, white bread, white pasta, and white rice. Look for whole grain options, oat flour or almond flour.  Each meal should contain half fruits/vegetables, one quarter protein, and one quarter carbs (no bigger than a computer mouse).  Cut down on sweet beverages. This includes juice, soda, and sweet tea. Also watch fruit intake, though this is a healthier sweet option, it still contains natural sugar! Limit to 3 servings daily.  Drink at least 1 glass of water with each meal and aim for at least 8 glasses per day  Exercise at least 150 minutes every week.

## 2022-02-27 NOTE — Progress Notes (Signed)
Subjective:     Patient ID: Stephanie Chase, female    DOB: Aug 24, 1960, 61 y.o.   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 June 2021. Tried going back on reduced hours and fatigue/pain, etc, couldn't do it.  Would have to nap and then restart work.  The Long term disability company told her to apply for Soc Security disability so applied in Jan, approved in Nov retro to June of 2022.  Has form to complete for Long term disability  Chronic neck pain-ESI-helps short time.  Trying to use mouse and pain rad down arm and R hand "ice cold" from pinched nerve.  Has had injections in R shoulder. Doing PT.   Sees ortho this month. 12/18.  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   Back pain-can't sit or stand long periods.  Can't use arm long periods.  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 Tries to limit tylenol/advil.  Can't stop caffeine  Hasn't tried gabapentin  Has maintenance inhaler bid and having trouble getting rescue.   Will start back on prolia-  Health Maintenance Due  Topic Date Due   Lung Cancer Screening  Never done    Past Medical History:  Diagnosis Date   Allergy Seasonal   Anemia    Anxiety    Asthma    Breast cancer ([place])    right, 05/13/20 completed Taxol, on Herceptin   Cervical paraspinous muscle spasm    COPD (chronic obstructive pulmonary disease) (HCC)    emphysema   Depression    Discoid lupus    Discoid lupus    Emphysema of lung ([place])    Family history of bladder cancer    Family history of BRCA gene mutation    Family history of breast cancer    Family history of prostate cancer    GERD (gastroesophageal reflux disease)    Hypercholesteremia    Hypothyroidism    Neuromuscular disorder ([place])     Neuropathy    Osteoporosis    Pneumonia    Thyroid disease     Past Surgical History:  Procedure Laterality Date   ABLATION ON ENDOMETRIOSIS  2500   APPLICATION OF A-CELL OF EXTREMITY Right 10/22/2019   Procedure: EXCISION OF RIGHT BREAST WOUND WITH PRIMARY CLOSURE;  Surgeon: [name] Going, DO;  Location: [facility];  Service: Plastics;  Laterality: Right;   BREAST LUMPECTOMY     DEBRIDEMENT AND CLOSURE WOUND Right 10/22/2019   Procedure: Excision of right breast wound;  Surgeon: [name] Going, DO;  Location: [facility];  Service: Plastics;  Laterality: Right;   MASTECTOMY W/ SENTINEL NODE BIOPSY Bilateral 09/05/2019   Procedure: BILATERAL MASTECTOMY WITH RIGHT SENTINEL LYMPH NODE BIOPSY;  Surgeon: Stark Klein, MD;  Location: [facility];  Service: General;  Laterality: Bilateral;  COMBINED WITH REGIONAL FOR POST OP PAIN   PORTACATH PLACEMENT Left 09/05/2019   Procedure: INSERTION PORT-A-CATH WITH ULTRASOUND GUIDANCE;  Surgeon: Stark Klein, MD;  Location: [facility];  Service: General;  Laterality: Left;   ROTATOR CUFF REPAIR Right 06/09/2021   TONSILLECTOMY     TUBAL LIGATION     WISDOM TOOTH EXTRACTION      Outpatient Medications Prior to Visit  Medication Sig Dispense Refill   acetaminophen (TYLENOL) 500 MG tablet Take 500-1,000 mg by mouth every 6 (six) hours as needed for moderate pain.      albuterol (PROAIR HFA) 108 (90 Base) MCG/ACT inhaler Inhale 2 puffs into the lungs every 6 (six) hours as needed for wheezing or shortness of breath. 1 each 5   DULoxetine (CYMBALTA) 60 MG capsule      ergocalciferol (VITAMIN D2) 1.25 MG (50000 UT) capsule Take 1 capsule (50,000 Units total) by mouth once a week. 30 capsule 1   fluticasone (FLONASE) 50 MCG/ACT nasal spray Place 2 sprays into both nostrils in the morning and at bedtime. 16 g 5   gabapentin (NEURONTIN) 100 MG capsule Take 1 capsule (100 mg total) by mouth at bedtime. Increase in 5-7 days to 2 and then to 3 if tolerate 90 capsule 1    ipratropium (ATROVENT) 0.06 % nasal spray Place 2 sprays into both nostrils 4 (four) times daily. As needed for nasal congestion, runny nose 15 mL 0   levothyroxine (SYNTHROID) 75 MCG tablet Take 75 mcg by mouth daily before breakfast.     liothyronine (CYTOMEL) 5 MCG tablet TAKE 2 TABLETS (=10MCG     TOTAL)     DAILY 180 tablet 1   meloxicam (MOBIC) 15 MG tablet Take 1 tablet by mouth as directed.     tretinoin (RETIN-A) 0.025 % cream Apply topically at bedtime.     triamcinolone cream (KENALOG) 0.1 % Apply 1 Application topically 2 (two) times daily.     No facility-administered medications prior to visit.    Allergies  Allergen Reactions   Bactrim [Sulfamethoxazole-Trimethoprim] Itching and Rash   ROS neg/noncontributory except as noted HPI/below      Objective:     BP 124/82   Pulse 87   Temp (!) 97.5 F (36.4 C) (Temporal)   Ht _0  (1.651 m)   Wt 176 lb 2 oz (79.9 kg)   SpO2 95%   BMI 29.31 kg/m  Wt Readings from Last 3 Encounters:  02/27/22 176 lb 2 oz (79.9 kg)  02/27/22 177 lb (80.3 kg)  02/01/22 177 lb (80.3 kg)    Physical Exam   Gen: WDWN NAD HEENT: NCAT, conjunctiva not injected, sclera nonicteric CARDIAC: RRR, S1S2+, no murmur.  LUNGS: CTAB. No wheezes EXT:  no edema MSK: no gross abnormalities.  NEURO: A&O x3.  CN II-XII intact.  PSYCH: normal mood. Good eye contact  Spent 45 minutes with patient getting history, working on for filling out the disability forms.  Discussing plan and adjustments to plan.  Reviewing oncology notes, neuro notes     Assessment & Plan:   Problem List Items Addressed This Visit       Other   Malignant neoplasm of upper-outer quadrant of right breast in female, estrogen receptor positive ([place])   Polyarthralgia - Primary   Excessive sleepiness   Intractable headache   Other Visit Diagnoses     Screening for lung cancer       Relevant Orders   Low Dose CT Chest w/o Contrast for Lung Cancer Screening [[id number]]      1.  Polyarthralgia-multifactorial.  Chronic.  Not optimal control.  Some is osteoarthritis, some referred from chronic spine pain, some fibromyalgia.  Patient is working on physical therapy and seeing Ortho.  She has been approved for disability, has some forms to fill out for long-term disability.  Continue care through Ortho.  Tylenol.  Cymbalta 60 mg.  Start  gabapentin 100 mg at at bedtime.  Meloxicam 15 mg. 2.  Chronic fatigue-multifactorial.  Some is brain fog from fibromyalgia, residual of chemotherapy.  May be some depression as well.  Advised that there may be no specific etiology and answers to her questions.  She has been deemed disabled through [place] due to the fact that she cannot stay awake for long periods of time.  Advised that this may wax and wane, resolve, or stay the same. 3.  Chronic daily headache-multifactorial.  Not well-controlled.  Advised to limit over-the-counter rescue medications to less than 2 times per week.  Advised to cut back on caffeine.  Advised to stop artificial sweeteners.  She is also working on multiple supplements.  Has not started gabapentin 100 mg at bedtime.  Advised to start to we will titrate as tolerated. 4.  Breast cancer-being followed by oncology.  Follow-up in 2 to 3 months  No orders of the defined types were placed in this encounter.   Wellington Hampshire, MD

## 2022-02-28 ENCOUNTER — Encounter: Payer: Self-pay | Admitting: *Deleted

## 2022-02-28 ENCOUNTER — Encounter: Payer: Self-pay | Admitting: Neurology

## 2022-02-28 LAB — CANCER ANTIGEN 27.29: CA 27.29: 14.1 U/mL (ref 0.0–38.6)

## 2022-03-02 ENCOUNTER — Encounter: Payer: Self-pay | Admitting: Neurology

## 2022-03-02 ENCOUNTER — Ambulatory Visit (INDEPENDENT_AMBULATORY_CARE_PROVIDER_SITE_OTHER): Payer: BC Managed Care – PPO | Admitting: Neurology

## 2022-03-02 ENCOUNTER — Other Ambulatory Visit: Payer: Self-pay | Admitting: Hematology & Oncology

## 2022-03-02 VITALS — BP 138/86 | HR 81 | Ht 65.0 in | Wt 176.2 lb

## 2022-03-02 DIAGNOSIS — E663 Overweight: Secondary | ICD-10-CM

## 2022-03-02 DIAGNOSIS — Z9189 Other specified personal risk factors, not elsewhere classified: Secondary | ICD-10-CM

## 2022-03-02 DIAGNOSIS — G4719 Other hypersomnia: Secondary | ICD-10-CM | POA: Diagnosis not present

## 2022-03-02 DIAGNOSIS — R0683 Snoring: Secondary | ICD-10-CM

## 2022-03-02 DIAGNOSIS — R519 Headache, unspecified: Secondary | ICD-10-CM

## 2022-03-02 NOTE — Progress Notes (Signed)
Subjective:    Patient ID: Stephanie Chase is a 61 y.o. female.  HPI    Star Age, MD, PhD [facility] [address], [address]O. [place], [place] [address]  Dear Aliene Beams,   I saw your patient, Stephanie Chase, upon your kind request in my sleep clinic today for initial consultation of her sleep disorder, in particular, concern for underlying obstructive sleep apnea.  The patient is unaccompanied today.  As you know, Stephanie Chase is a 61 year old female with an underlying medical history of anemia, allergies, asthma, breast cancer, COPD, depression, anxiety, reflux disease, hypothyroidism, hyperlipidemia, neuropathy, recurrent headaches, osteoporosis, and overweight state, who reports snoring and excessive daytime somnolence.  Her Epworth sleepiness score is 8 out of 24, fatigue severity score is 43 out of 63. I reviewed your office note from 01/18/2022. She has followed with Dr. Tomi Likens since November 2023. She goes to bed generally between 9 and 11 PM and rise time is generally between 6 and 9 AM.  She has discomfort at night and does not feel well rested, she had recent shoulder surgery in March 2023 and also has neck discomfort.  She has just started gabapentin.  She has no recurrent morning headaches.  She denies night to night nocturia.  She lives with her boyfriend.  Mild snoring is reported.  She is not aware of any family history of sleep apnea.  She has gained weight in the realm of 20 pounds in the past 2 years.  She drinks quite a bit of caffeine in the form of diet Coke, about 6 bottles per day, 16.9 ounce size.  She drinks alcohol rarely, she smokes about three quarters of a pack per day.  Her Past Medical History Is Significant For: Past Medical History:  Diagnosis Date   Allergy Seasonal   Anemia    Anxiety    Asthma    Breast cancer ([place])    right, 05/13/20 completed Taxol, on Herceptin   Cervical paraspinous muscle spasm    COPD (chronic  obstructive pulmonary disease) (HCC)    emphysema   Depression    Discoid lupus    Discoid lupus    Emphysema of lung ([place])    Family history of bladder cancer    Family history of BRCA gene mutation    Family history of breast cancer    Family history of prostate cancer    GERD (gastroesophageal reflux disease)    Hypercholesteremia    Hypothyroidism    Neuromuscular disorder ([place])    Neuropathy    Osteoporosis    Pneumonia    Thyroid disease     Her Past Surgical History Is Significant For: Past Surgical History:  Procedure Laterality Date   ABLATION ON ENDOMETRIOSIS  1308   APPLICATION OF A-CELL OF EXTREMITY Right 10/22/2019   Procedure: EXCISION OF RIGHT BREAST WOUND WITH PRIMARY CLOSURE;  Surgeon: [name] Going, DO;  Location: [facility];  Service: Plastics;  Laterality: Right;   BREAST LUMPECTOMY     DEBRIDEMENT AND CLOSURE WOUND Right 10/22/2019   Procedure: Excision of right breast wound;  Surgeon: [name] Going, DO;  Location: [facility];  Service: Plastics;  Laterality: Right;   MASTECTOMY W/ SENTINEL NODE BIOPSY Bilateral 09/05/2019   Procedure: BILATERAL MASTECTOMY WITH RIGHT SENTINEL LYMPH NODE BIOPSY;  Surgeon: Stark Klein, MD;  Location: [facility];  Service: General;  Laterality: Bilateral;  COMBINED WITH REGIONAL FOR POST OP PAIN   PORTACATH PLACEMENT Left 09/05/2019  Procedure: INSERTION PORT-A-CATH WITH ULTRASOUND GUIDANCE;  Surgeon: Stark Klein, MD;  Location: [facility];  Service: General;  Laterality: Left;   ROTATOR CUFF REPAIR Right 06/09/2021   TONSILLECTOMY     TUBAL LIGATION     WISDOM TOOTH EXTRACTION      Her Family History Is Significant For: Family History  Problem Relation Age of Onset   Breast cancer Mother 21   Diabetes Mother    Bladder Cancer Mother 63       ureteral cancer   Cancer Mother        blood cancer   Asthma Mother    Depression Mother    Hearing loss Mother    Kidney disease Mother    Vision loss Mother    Dementia  Father    Heart attack Father    Hyperlipidemia Father    Hypertension Father    Alzheimer's disease Father    Prostate cancer Father        dx. >50   Cancer Father    COPD Father    Hearing loss Father    Heart disease Father    Cancer Maternal Aunt    Cancer Maternal Uncle        prostate or colon   Cancer Paternal Uncle    BRCA 1/2 Daughter    Cancer Cousin        unknown type; dx. in her 40s (maternal cousin)   Alzheimer's disease Other    Breast cancer Other 3       maternal great-aunt   Sleep apnea Neg Hx     Her Social History Is Significant For: Social History   Socioeconomic History   Marital status: Divorced    Spouse name: Not on file   Number of children: 2   Years of education: Not on file   Highest education level: Some college, no degree  Occupational History   Not on file  Tobacco Use   Smoking status: Every Day    Packs/day: 0.50    Years: 50.00    Total pack years: 25.00    Types: Cigarettes    Start date: 1973   Smokeless tobacco: Never  Vaping Use   Vaping Use: Never used  Substance and Sexual Activity   Alcohol use: Yes    Comment: Couple times a year   Drug use: No   Sexual activity: Yes    Partners: Male    Birth control/protection: None    Comment: BTL and ablation  Other Topics Concern   Not on file  Social History Narrative   Disabled-commercial lending assist.   Are you right handed or left handed? Right    Are you currently employed ? no   What is your current occupation?   Do you live at home alone? no   Who lives with you? Boufriend   What type of home do you live in: 1 story or 2 story?  1      Social Determinants of Radio broadcast assistant Strain: Not on file  Food Insecurity: Not on file  Transportation Needs: Not on file  Physical Activity: Not on file  Stress: Not on file  Social Connections: Not on file    Her Allergies Are:  Allergies  Allergen Reactions   Bactrim [Sulfamethoxazole-Trimethoprim]  Itching and Rash  :   Her Current Medications Are:  Outpatient Encounter Medications as of 03/02/2022  Medication Sig   acetaminophen (TYLENOL) 500 MG tablet Take 500-1,000 mg by mouth  every 6 (six) hours as needed for moderate pain.    albuterol (PROAIR HFA) 108 (90 Base) MCG/ACT inhaler Inhale 2 puffs into the lungs every 6 (six) hours as needed for wheezing or shortness of breath.   DULoxetine (CYMBALTA) 60 MG capsule    ergocalciferol (VITAMIN D2) 1.25 MG (50000 UT) capsule Take 1 capsule (50,000 Units total) by mouth once a week.   fluticasone (FLONASE) 50 MCG/ACT nasal spray Place 2 sprays into both nostrils in the morning and at bedtime.   gabapentin (NEURONTIN) 100 MG capsule Take 1 capsule (100 mg total) by mouth at bedtime. Increase in 5-7 days to 2 and then to 3 if tolerate   ipratropium (ATROVENT) 0.06 % nasal spray Place 2 sprays into both nostrils 4 (four) times daily. As needed for nasal congestion, runny nose   levothyroxine (SYNTHROID) 75 MCG tablet Take 75 mcg by mouth daily before breakfast.   liothyronine (CYTOMEL) 5 MCG tablet TAKE 2 TABLETS (=10MCG     TOTAL)     DAILY   meloxicam (MOBIC) 15 MG tablet Take 1 tablet by mouth as directed.   tretinoin (RETIN-A) 0.025 % cream Apply topically at bedtime.   triamcinolone cream (KENALOG) 0.1 % Apply 1 Application topically 2 (two) times daily.   No facility-administered encounter medications on file as of 03/02/2022.  :   Review of Systems:  Out of a complete 14 point review of systems, all are reviewed and negative with the exception of these symptoms as listed below:  Review of Systems  Neurological:        Pt here for sleep consult Pt has some snoring ,headache,fatigue . Pt denies hypertension,sleep study,CPAP       ESS:8 FSS:43    Objective:  Neurological Exam  Physical Exam Physical Examination:   Vitals:   03/02/22 1030  BP: 138/86  Pulse: 81    General Examination: The patient is a very pleasant  61 y.o. female in no acute distress. She appears well-developed and well-nourished and well groomed.   HEENT: Normocephalic, atraumatic, pupils are equal, round and reactive to light, extraocular tracking is good without limitation to gaze excursion or nystagmus noted. Hearing is grossly intact. Face is symmetric with normal facial animation. Speech is scant, no dysarthria noted. There is no hypophonia. There is no lip, neck/head, jaw or voice tremor. Neck is supple with full range of passive and active motion. There are no carotid bruits on auscultation. Oropharynx exam reveals: moderate mouth dryness, adequate dental hygiene and mild airway crowding, due due to smaller airway entry, Mallampati class III, tonsils absent, slightly wider uvula noted.  Neck circumference of 14 5/8 inches.  Tongue protrudes centrally and palate elevates symmetrically.  Minimal overbite.   Chest: Clear to auscultation without wheezing, rhonchi or crackles noted.  Heart: S1+S2+0, regular and normal without murmurs, rubs or gallops noted.   Abdomen: Soft, non-tender and non-distended.  Extremities: There is no pitting edema in the distal lower extremities bilaterally.   Skin: Warm and dry without trophic changes noted.   Musculoskeletal: exam reveals no obvious joint deformities.   Neurologically:  Mental status: The patient is awake, alert and oriented in all 4 spheres. Her immediate and remote memory, attention, language skills and fund of knowledge are appropriate. There is no evidence of aphasia, agnosia, apraxia or anomia. Speech is clear with normal prosody and enunciation. Thought process is linear. Mood is normal and affect is normal.  Cranial nerves II - XII are as described above under  HEENT exam.  Motor exam: Normal bulk, strength and tone is noted. There is no obvious action or resting tremor.  Fine motor skills and coordination: grossly intact.  Cerebellar testing: No dysmetria or intention tremor. There  is no truncal or gait ataxia.  Sensory exam: intact to light touch in the upper and lower extremities.  Gait, station and balance: She stands easily. No veering to one side is noted. No leaning to one side is noted. Posture is age-appropriate and stance is narrow based. Gait shows normal stride length and normal pace. No problems turning are noted.   Assessment and Plan:  In summary, Stephanie Chase is a very pleasant 65 y.o.-year old female with an underlying medical history of anemia, allergies, asthma, breast cancer, COPD, depression, anxiety, reflux disease, hypothyroidism, hyperlipidemia, neuropathy, recurrent headaches, osteoporosis, and overweight state, whose history and physical exam are concerning for sleep disordered breathing, particularly obstructive sleep apnea (OSA). A laboratory attended sleep study is typically considered "gold standard" for evaluation of sleep disordered breathing.   I had a long chat with the patient about my findings and the diagnosis of sleep apnea, particularly OSA, its prognosis and treatment options. We talked about medical/conservative treatments, surgical interventions and non-pharmacological approaches for symptom control. I explained, in particular, the risks and ramifications of untreated moderate to severe OSA, especially with respect to developing cardiovascular disease down the road, including congestive heart failure (CHF), difficult to treat hypertension, cardiac arrhythmias (particularly A-fib), neurovascular complications including TIA, stroke and dementia. Even type 2 diabetes has, in part, been linked to untreated OSA. Symptoms of untreated OSA may include (but may not be limited to) daytime sleepiness, nocturia (i.e. frequent nighttime urination), memory problems, mood irritability and suboptimally controlled or worsening mood disorder such as depression and/or anxiety, lack of energy, lack of motivation, physical discomfort, as well as recurrent  headaches, especially morning or nocturnal headaches. We talked about the importance of maintaining a healthy lifestyle and striving for healthy weight.  The importance of complete smoking cessation was also addressed.  She is advised to reduce her caffeine intake and limit herself to about 2 servings per day, especially in light of recurrent headaches.  She denies morning headaches.  I recommended a sleep study at this time. I outlined the differences between a laboratory attended sleep study which is considered more comprehensive and accurate over the option of a home sleep test (HST); the latter may lead to underestimation of sleep disordered breathing in some instances and does not help with diagnosing upper airway resistance syndrome and is not accurate enough to diagnose primary central sleep apnea typically. I outlined possible surgical and non-surgical treatment options of OSA, including the use of a positive airway pressure (PAP) device (i.e. CPAP, AutoPAP/APAP or BiPAP in certain circumstances), a custom-made dental device (aka oral appliance, which would require a referral to a specialist dentist or orthodontist typically, and is generally speaking not considered for patients with full dentures or edentulous state), upper airway surgical options, such as traditional UPPP (which is not considered a first-line treatment) or the Inspire device (hypoglossal nerve stimulator, which would involve a referral for consultation with an ENT surgeon, after careful selection, following inclusion criteria - also not first-line treatment). I explained the PAP treatment option to the patient in detail, as this is generally considered first-line treatment.  The patient indicated that she would be willing to try PAP therapy, if the need arises. I explained the importance of being compliant with PAP treatment, not  only for insurance purposes but primarily to improve patient's symptoms symptoms, and for the patient's long  term health benefit, including to reduce Her cardiovascular risks longer-term.    We will pick up our discussion about the next steps and treatment options after testing.  We will keep her posted as to the test results by phone call and/or MyChart messaging where possible.  We will plan to follow-up in sleep clinic accordingly as well.  I answered all her questions today and the patient was in agreement.   I encouraged her to call with any interim questions, concerns, problems or updates or email [place] through [place].  Generally speaking, sleep test authorizations may take up to 2 weeks, sometimes less, sometimes longer, the patient is encouraged to get in touch with [place] if they do not hear back from the sleep lab staff directly within the next 2 weeks.  Thank you very much for allowing me to participate in the care of this nice patient. If I can be of any further assistance to you please do not hesitate to talk to me.  Sincerely,   Star Age, MD, PhD

## 2022-03-02 NOTE — Patient Instructions (Signed)

## 2022-03-08 ENCOUNTER — Telehealth: Payer: Self-pay

## 2022-03-08 NOTE — Telephone Encounter (Signed)
Disability paperwork given 03/07/22.  Per Dr.Jaffe he will not fill out the paperwork, There is no neurologic reason he feels to have Disability.

## 2022-03-14 DIAGNOSIS — Z0271 Encounter for disability determination: Secondary | ICD-10-CM

## 2022-03-27 HISTORY — PX: CERVICAL SPINE SURGERY: SHX589

## 2022-04-03 ENCOUNTER — Ambulatory Visit
Admission: RE | Admit: 2022-04-03 | Discharge: 2022-04-03 | Disposition: A | Discharge status: Home / Self Care | Payer: BC Managed Care – PPO | Source: Ambulatory Visit | Attending: Family Medicine | Admitting: Family Medicine

## 2022-04-03 DIAGNOSIS — Z122 Encounter for screening for malignant neoplasm of respiratory organs: Secondary | ICD-10-CM

## 2022-05-01 ENCOUNTER — Inpatient Hospital Stay: Payer: BC Managed Care – PPO

## 2022-05-01 ENCOUNTER — Inpatient Hospital Stay: Payer: BC Managed Care – PPO | Admitting: Hematology & Oncology

## 2022-05-02 ENCOUNTER — Encounter: Payer: Self-pay | Admitting: Family Medicine

## 2022-05-02 ENCOUNTER — Ambulatory Visit (INDEPENDENT_AMBULATORY_CARE_PROVIDER_SITE_OTHER): Payer: BC Managed Care – PPO | Admitting: Family Medicine

## 2022-05-02 VITALS — BP 110/80 | HR 96 | Temp 98.2°F | Ht 65.0 in | Wt 175.4 lb

## 2022-05-02 DIAGNOSIS — J440 Chronic obstructive pulmonary disease with acute lower respiratory infection: Secondary | ICD-10-CM | POA: Diagnosis not present

## 2022-05-02 DIAGNOSIS — M2550 Pain in unspecified joint: Secondary | ICD-10-CM | POA: Diagnosis not present

## 2022-05-02 DIAGNOSIS — J209 Acute bronchitis, unspecified: Secondary | ICD-10-CM

## 2022-05-02 MED ORDER — MELOXICAM 15 MG PO TABS: 15.0000 mg | ORAL_TABLET | ORAL | 1 refills | Status: DC

## 2022-05-02 MED ORDER — PREDNISONE 20 MG PO TABS: 40.0000 mg | ORAL_TABLET | Freq: Every day | ORAL | 0 refills | Status: AC

## 2022-05-02 MED ORDER — GABAPENTIN 100 MG PO CAPS: 200.0000 mg | ORAL_CAPSULE | Freq: Two times a day (BID) | ORAL | 1 refills | Status: DC

## 2022-05-02 MED ORDER — AZITHROMYCIN 250 MG PO TABS: ORAL_TABLET | ORAL | 0 refills | Status: AC

## 2022-05-02 NOTE — Progress Notes (Signed)
Subjective:     Patient ID: Stephanie Chase, female    DOB: Jul 22, 1960, 62 y.o.   MRN: [id number]  Chief Complaint  Patient presents with   Follow-up    2 month follow-up for pain   Cough    Sx started Friday   Headache   Nasal Congestion    HPI  Cough, congestion, ha since 2/2.  No f/c.no new sob. Some nausea when eats.  No v/d. Smoker.  Was in [place] until 1/30.  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  On cymbalta '60mg'$ , meloxicam '15mg'$ .  Saw ortho in dec and another neck injection.  Not help much. Will see surgeon.  Good and bad days  Health Maintenance Due  Topic Date Due   PAP SMEAR-Modifier  06/26/2022    Past Medical History:  Diagnosis Date   Allergy Seasonal   Anemia    Anxiety    Asthma    Breast cancer ([place])    right, 05/13/20 completed Taxol, on Herceptin   Cervical paraspinous muscle spasm    COPD (chronic obstructive pulmonary disease) ([facility])    emphysema   Depression    Discoid lupus    Discoid lupus    Emphysema of lung ([place])    Family history of bladder cancer    Family history of BRCA gene mutation    Family history of breast cancer    Family history of prostate cancer    GERD (gastroesophageal reflux disease)    Hypercholesteremia    Hypothyroidism    Neuromuscular disorder ([place])    Neuropathy    Osteoporosis    Pneumonia    Thyroid disease     Past Surgical History:  Procedure Laterality Date   ABLATION ON ENDOMETRIOSIS  5361   APPLICATION OF A-CELL OF EXTREMITY Right 10/22/2019   Procedure: EXCISION OF RIGHT BREAST WOUND WITH PRIMARY CLOSURE;  Surgeon: [name] Going, DO;  Location: [facility];  Service: Plastics;  Laterality: Right;   BREAST LUMPECTOMY     DEBRIDEMENT AND CLOSURE WOUND Right 10/22/2019   Procedure: Excision of right breast wound;  Surgeon: [name] Going, DO;  Location: [facility];  Service: Plastics;  Laterality: Right;   MASTECTOMY W/ SENTINEL NODE BIOPSY  Bilateral 09/05/2019   Procedure: BILATERAL MASTECTOMY WITH RIGHT SENTINEL LYMPH NODE BIOPSY;  Surgeon: Stark Klein, MD;  Location: [facility];  Service: General;  Laterality: Bilateral;  COMBINED WITH REGIONAL FOR POST OP PAIN   PORTACATH PLACEMENT Left 09/05/2019   Procedure: INSERTION PORT-A-CATH WITH ULTRASOUND GUIDANCE;  Surgeon: Stark Klein, MD;  Location: [facility];  Service: General;  Laterality: Left;   ROTATOR CUFF REPAIR Right 06/09/2021   TONSILLECTOMY     TUBAL LIGATION     WISDOM TOOTH EXTRACTION      Outpatient Medications Prior to Visit  Medication Sig Dispense Refill   acetaminophen (TYLENOL) 500 MG tablet Take 500-1,000 mg by mouth every 6 (six) hours as needed for moderate pain.      albuterol (PROAIR HFA) 108 (90 Base) MCG/ACT inhaler Inhale 2 puffs into the lungs every 6 (six) hours as needed for wheezing or shortness of breath. 1 each 5   DULoxetine (CYMBALTA) 60 MG capsule      fluticasone (FLONASE) 50 MCG/ACT nasal spray Place 2 sprays into both nostrils in the morning and at bedtime. 16 g 5   gabapentin (NEURONTIN) 100 MG capsule Take 1 capsule (100 mg total) by mouth at bedtime. Increase in  5-7 days to 2 and then to 3 if tolerate 90 capsule 1   ipratropium (ATROVENT) 0.06 % nasal spray Place 2 sprays into both nostrils 4 (four) times daily. As needed for nasal congestion, runny nose 15 mL 0   levothyroxine (SYNTHROID) 75 MCG tablet Take 75 mcg by mouth daily before breakfast.     liothyronine (CYTOMEL) 5 MCG tablet TAKE 2 TABLETS (=10MCG     TOTAL)     DAILY 180 tablet 1   meloxicam (MOBIC) 15 MG tablet Take 1 tablet by mouth as directed.     tretinoin (RETIN-A) 0.025 % cream Apply topically at bedtime.     triamcinolone cream (KENALOG) 0.1 % Apply 1 Application topically 2 (two) times daily.     Vitamin D, Ergocalciferol, (DRISDOL) 1.25 MG (50000 UNIT) CAPS capsule TAKE 1 CAPSULE BY MOUTH ONE TIME PER WEEK 12 capsule 4   No facility-administered medications prior to  visit.    Allergies  Allergen Reactions   Bactrim [Sulfamethoxazole-Trimethoprim] Itching and Rash   ROS neg/noncontributory except as noted HPI/below If stretches, feels like area near diaphragm gets "stuck"      Objective:     BP 110/80   Pulse 96   Temp 98.2 F (36.8 C) (Temporal)   Ht '5\' 5"'$  (1.651 m)   Wt 175 lb 6 oz (79.5 kg)   SpO2 97%   BMI 29.18 kg/m  Wt Readings from Last 3 Encounters:  05/02/22 175 lb 6 oz (79.5 kg)  03/02/22 176 lb 3.2 oz (79.9 kg)  02/27/22 176 lb 2 oz (79.9 kg)    Physical Exam   Gen: WDWN NAD HEENT: NCAT, conjunctiva not injected, sclera nonicteric TM WNL B, OP moist, no exudates  NECK:  supple, no thyromegaly, no nodes, no carotid bruits CARDIAC: RRR, S1S2+, no murmur. DP 2+B LUNGS: CTAB. No wheezes EXT:  no edema MSK: no gross abnormalities.  NEURO: A&O x3.  CN II-XII intact.  PSYCH: normal mood. Good eye contact     Assessment & Plan:   Problem List Items Addressed This Visit       Other   Polyarthralgia - Primary   Other Visit Diagnoses     Acute bronchitis with COPD ([place])          Polyarthralgia-Increase duloxetine to '90mg'$  for 2-3 wks.  If no adverse effects, then increase to '60mg'$  twice daily,Increase in 3 days, the gabapentin to '200mg'$  at bed for 1 wk, and if no adverse effect, increase to '300mg'$  at bed.  If no adverse problems from duloxetine and the 300of gabapentin in 1 month, try '300mg'$  gabapentin twice daily.  F/u 2 mo Copd exacerbation w/bronchitis-pred '40mg'$  daily and zpk.   No orders of the defined types were placed in this encounter.   Wellington Hampshire, MD

## 2022-05-02 NOTE — Patient Instructions (Addendum)
Increase duloxetine to '90mg'$  for 2-3 wks.  If no adverse effects, then increase to '60mg'$  twice daily  Increase in 3 days, the gabapentin to '200mg'$  at bed for 1 wk, and if no adverse effect, increase to '300mg'$  at bed.  If no adverse problems from duloxetine and the 300of gabapentin in 1 month, try '300mg'$  gabapentin twice daily.  Take meloxicam daily-no aleve/advil.  Can still do tylenol-for pain  Don't take meloxicam/advil/aleve while on prednisone

## 2022-05-16 ENCOUNTER — Inpatient Hospital Stay: Payer: BC Managed Care – PPO | Attending: Hematology

## 2022-05-16 ENCOUNTER — Inpatient Hospital Stay (HOSPITAL_BASED_OUTPATIENT_CLINIC_OR_DEPARTMENT_OTHER): Payer: BC Managed Care – PPO | Admitting: Medical Oncology

## 2022-05-16 VITALS — BP 144/95 | HR 86 | Temp 97.9°F | Resp 17 | Ht 65.0 in | Wt 178.0 lb

## 2022-05-16 DIAGNOSIS — Z853 Personal history of malignant neoplasm of breast: Secondary | ICD-10-CM | POA: Diagnosis present

## 2022-05-16 DIAGNOSIS — E038 Other specified hypothyroidism: Secondary | ICD-10-CM

## 2022-05-16 DIAGNOSIS — C50411 Malignant neoplasm of upper-outer quadrant of right female breast: Secondary | ICD-10-CM

## 2022-05-16 DIAGNOSIS — Z08 Encounter for follow-up examination after completed treatment for malignant neoplasm: Secondary | ICD-10-CM | POA: Insufficient documentation

## 2022-05-16 DIAGNOSIS — Z9221 Personal history of antineoplastic chemotherapy: Secondary | ICD-10-CM | POA: Diagnosis not present

## 2022-05-16 DIAGNOSIS — Z170 Estrogen receptor positive status [ER+]: Secondary | ICD-10-CM | POA: Diagnosis not present

## 2022-05-16 DIAGNOSIS — Z9013 Acquired absence of bilateral breasts and nipples: Secondary | ICD-10-CM | POA: Insufficient documentation

## 2022-05-16 DIAGNOSIS — M818 Other osteoporosis without current pathological fracture: Secondary | ICD-10-CM | POA: Diagnosis not present

## 2022-05-16 LAB — LACTATE DEHYDROGENASE: LDH: 172 U/L (ref 98–192)

## 2022-05-16 LAB — CBC WITH DIFFERENTIAL (CANCER CENTER ONLY)
Abs Immature Granulocytes: 0.02 10*3/uL (ref 0.00–0.07)
Basophils Absolute: 0 10*3/uL (ref 0.0–0.1)
Basophils Relative: 0 %
Eosinophils Absolute: 0 10*3/uL (ref 0.0–0.5)
Eosinophils Relative: 0 %
HCT: 44.7 % (ref 36.0–46.0)
Hemoglobin: 14.6 g/dL (ref 12.0–15.0)
Immature Granulocytes: 0 %
Lymphocytes Relative: 19 %
Lymphs Abs: 1.7 10*3/uL (ref 0.7–4.0)
MCH: 28.2 pg (ref 26.0–34.0)
MCHC: 32.7 g/dL (ref 30.0–36.0)
MCV: 86.5 fL (ref 80.0–100.0)
Monocytes Absolute: 0.6 10*3/uL (ref 0.1–1.0)
Monocytes Relative: 7 %
Neutro Abs: 6.6 10*3/uL (ref 1.7–7.7)
Neutrophils Relative %: 74 %
Platelet Count: 307 10*3/uL (ref 150–400)
RBC: 5.17 MIL/uL — ABNORMAL HIGH (ref 3.87–5.11)
RDW: 13.5 % (ref 11.5–15.5)
WBC Count: 9 10*3/uL (ref 4.0–10.5)
nRBC: 0 % (ref 0.0–0.2)

## 2022-05-16 LAB — CMP (CANCER CENTER ONLY)
ALT: 21 U/L (ref 0–44)
AST: 26 U/L (ref 15–41)
Albumin: 3.8 g/dL (ref 3.5–5.0)
Alkaline Phosphatase: 108 U/L (ref 38–126)
Anion gap: 8 (ref 5–15)
BUN: 14 mg/dL (ref 6–20)
CO2: 25 mmol/L (ref 22–32)
Calcium: 9.9 mg/dL (ref 8.9–10.3)
Chloride: 104 mmol/L (ref 98–111)
Creatinine: 0.83 mg/dL (ref 0.44–1.00)
GFR, Estimated: >60 mL/min (ref >60)
Glucose, Bld: 96 mg/dL (ref 70–99)
Potassium: 3.9 mmol/L (ref 3.5–5.1)
Sodium: 137 mmol/L (ref 135–145)
Total Bilirubin: 0.6 mg/dL (ref 0.3–1.2)
Total Protein: 7.4 g/dL (ref 6.5–8.1)

## 2022-05-16 LAB — TSH: TSH: 0.514 u[IU]/mL (ref 0.350–4.500)

## 2022-05-16 NOTE — Progress Notes (Signed)
Hematology and Oncology Follow Up Visit  Stephanie Chase [id number]:[id number] Sep 13, 1960 62 y.o. 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 6 months- next dose in 07/2022     Interim History:  Stephanie Chase is back for follow-up.    She is doing ok overall but still continues to have troubles with her neck. Her Orthopedist has referred her to a surgeon as PT, steroid injections have not offered relief of her radiculopathy. She has her visit with surgery next week.   She denies any unintentional weight loss, new bone pains, night sweats. She has not noticed any chest wall or axillary masses.    She has had no nausea or vomiting.  She is still smoking.  She has had no change in bowel or bladder habits.  She has had no rashes.  There is been no leg swelling.  She has had no fever.   Currently, I would say performance status is probably ECOG 2.  Wt Readings from Last 3 Encounters:  05/16/22 178 lb (80.7 kg)  05/02/22 175 lb 6 oz (79.5 kg)  03/02/22 176 lb 3.2 oz (79.9 kg)    Medications:  Current Outpatient Medications:    acetaminophen (TYLENOL) 500 MG tablet, Take 500-1,000 mg by mouth every 6 (six) hours as needed for moderate pain. , Disp: , Rfl:    albuterol (PROAIR HFA) 108 (90 Base) MCG/ACT inhaler, Inhale 2 puffs into the lungs every 6 (six) hours as needed for wheezing or shortness of breath., Disp: 1 each, Rfl: 5   DULoxetine (CYMBALTA) 60 MG capsule, , Disp: , Rfl:    fluticasone (FLONASE) 50 MCG/ACT nasal spray, Place 2 sprays into both nostrils in the morning and at bedtime., Disp: 16 g, Rfl: 5   gabapentin (NEURONTIN) 100 MG capsule, Take 2 capsules (200 mg  total) by mouth 2 (two) times daily. Increase in 5-7 days to 2 and then to 3 if tolerate, Disp: 360 capsule, Rfl: 1   ipratropium (ATROVENT) 0.06 % nasal spray, Place 2 sprays into both nostrils 4 (four) times daily. As needed for nasal congestion, runny nose, Disp: 15 mL, Rfl: 0   levothyroxine (SYNTHROID) 75 MCG tablet, Take 75 mcg by mouth daily before breakfast., Disp: , Rfl:    liothyronine (CYTOMEL) 5 MCG tablet, TAKE 2 TABLETS (=10MCG     TOTAL)     DAILY, Disp: 180 tablet, Rfl: 1   meloxicam (MOBIC) 15 MG tablet, Take 1 tablet (15 mg total) by mouth as directed., Disp: 30 tablet, Rfl: 1   tretinoin (RETIN-A) 0.025 % cream, Apply topically at bedtime., Disp: , Rfl:    triamcinolone cream (KENALOG) 0.1 %, Apply 1 Application topically 2 (two) times daily., Disp: , Rfl:    Vitamin D, Ergocalciferol, (DRISDOL) 1.25 MG (50000 UNIT) CAPS capsule, TAKE 1 CAPSULE BY MOUTH ONE TIME PER WEEK, Disp: 12 capsule, Rfl: 4  Allergies:  Allergies  Allergen Reactions   Bactrim [Sulfamethoxazole-Trimethoprim] Itching and Rash    Past Medical History, Surgical history, Social history, and Family History were reviewed and updated.  Review of Systems: Review of Systems  Constitutional:  Positive for fatigue.  HENT:   Negative for hearing loss and tinnitus.   Eyes:  Negative for eye  problems.  Respiratory:  Negative for cough.   Cardiovascular: Negative.   Gastrointestinal:  Negative for abdominal pain and nausea.  Endocrine: Negative.   Genitourinary: Negative.    Musculoskeletal:  Positive for arthralgias and myalgias.  Skin: Negative.   Neurological:  Negative for light-headedness.  Hematological: Negative.   Psychiatric/Behavioral: Negative.      Physical Exam:  height is 5' 5"$  (1.651 m) and weight is 178 lb (80.7 kg). Her oral temperature is 97.9 F (36.6 C). Her blood pressure is 144/95 (abnormal) and her pulse is 86. Her respiration is 17 and oxygen saturation is 99%.   Wt Readings from  Last 3 Encounters:  05/16/22 178 lb (80.7 kg)  05/02/22 175 lb 6 oz (79.5 kg)  03/02/22 176 lb 3.2 oz (79.9 kg)    Physical Exam Vitals reviewed.  Constitutional:      Comments: She has had bilateral mastectomies.  Mastectomy scars are well-healed.  There might be some nodularity along the mastectomy site.  Again I have to believe this is scar tissue.  There is no erythema or warmth or swelling associated with these.  I cannot palpate any bilateral axillary lymph nodes.  HENT:     Head: Normocephalic and atraumatic.  Eyes:     Pupils: Pupils are equal, round, and reactive to light.  Cardiovascular:     Rate and Rhythm: Normal rate and regular rhythm.     Heart sounds: Normal heart sounds.  Pulmonary:     Effort: Pulmonary effort is normal.     Breath sounds: Normal breath sounds.  Abdominal:     General: Bowel sounds are normal.     Palpations: Abdomen is soft.  Musculoskeletal:        General: No tenderness or deformity. Normal range of motion.     Cervical back: Normal range of motion.  Lymphadenopathy:     Cervical: No cervical adenopathy.  Skin:    General: Skin is warm and dry.     Findings: No erythema or rash.  Neurological:     Mental Status: She is alert and oriented to person, place, and time.  Psychiatric:        Behavior: Behavior normal.        Thought Content: Thought content normal.        Judgment: Judgment normal.    Lab Results  Component Value Date   WBC 9.0 05/16/2022   HGB 14.6 05/16/2022   HCT 44.7 05/16/2022   MCV 86.5 05/16/2022   PLT 307 05/16/2022     Chemistry      Component Value Date/Time   NA 137 05/16/2022 1019   K 3.9 05/16/2022 1019   CL 104 05/16/2022 1019   CO2 25 05/16/2022 1019   BUN 14 05/16/2022 1019   CREATININE 0.83 05/16/2022 1019   CREATININE 0.66 09/12/2017 0747      Component Value Date/Time   CALCIUM 9.9 05/16/2022 1019   ALKPHOS 108 05/16/2022 1019   AST 26 05/16/2022 1019   ALT 21 05/16/2022 1019   BILITOT  0.6 05/16/2022 1019      Impression and Plan: Stephanie Chase is is a very nice 62 year old postmenopausal female.  She is originally from [place].  She moved down to [facility].  She has 2 grandchildren already.  Her children live on the [place] the surgery offers her relief. I can tell that  this has been taxing on her.   In terms of her breast cancer surveillance she  appears to be doing well. She asks to bump out to follow up every 3-6 months instead of every two.   In terms of her osteoporosis, has dental cleaning scheduled for tomorrow. She is next due for her Zometa in May 2024  Disposition DEXA to be completed prior to next visit  RTC 3 months MD, labs, [place], [place] 2/20/20241:23 PM

## 2022-05-17 LAB — CANCER ANTIGEN 27.29: CA 27.29: 13.3 U/mL (ref 0.0–38.6)

## 2022-05-23 ENCOUNTER — Ambulatory Visit (HOSPITAL_BASED_OUTPATIENT_CLINIC_OR_DEPARTMENT_OTHER)
Admission: RE | Admit: 2022-05-23 | Discharge: 2022-05-23 | Disposition: A | Discharge status: Home / Self Care | Payer: BC Managed Care – PPO | Source: Ambulatory Visit | Attending: Medical Oncology | Admitting: Medical Oncology

## 2022-05-23 DIAGNOSIS — Z170 Estrogen receptor positive status [ER+]: Secondary | ICD-10-CM

## 2022-05-23 DIAGNOSIS — C50411 Malignant neoplasm of upper-outer quadrant of right female breast: Secondary | ICD-10-CM | POA: Diagnosis present

## 2022-05-23 DIAGNOSIS — M818 Other osteoporosis without current pathological fracture: Secondary | ICD-10-CM | POA: Diagnosis present

## 2022-05-24 ENCOUNTER — Other Ambulatory Visit: Payer: Self-pay | Admitting: Neurology

## 2022-06-13 ENCOUNTER — Ambulatory Visit: Payer: BC Managed Care – PPO | Admitting: Neurology

## 2022-06-25 ENCOUNTER — Other Ambulatory Visit: Payer: Self-pay | Admitting: Family Medicine

## 2022-06-27 ENCOUNTER — Telehealth: Payer: Self-pay | Admitting: Family Medicine

## 2022-06-27 ENCOUNTER — Ambulatory Visit
Admission: EM | Admit: 2022-06-27 | Discharge: 2022-06-27 | Disposition: A | Discharge status: Home / Self Care | Payer: BC Managed Care – PPO | Attending: Internal Medicine | Admitting: Internal Medicine

## 2022-06-27 DIAGNOSIS — T25121A Burn of first degree of right foot, initial encounter: Secondary | ICD-10-CM

## 2022-06-27 DIAGNOSIS — T25122A Burn of first degree of left foot, initial encounter: Secondary | ICD-10-CM

## 2022-06-27 DIAGNOSIS — T25129A Burn of first degree of unspecified foot, initial encounter: Secondary | ICD-10-CM

## 2022-06-27 MED ORDER — MUPIROCIN 2 % EX OINT: 1.0000 | TOPICAL_OINTMENT | Freq: Two times a day (BID) | CUTANEOUS | 0 refills | Status: DC

## 2022-06-27 NOTE — Discharge Instructions (Signed)
I have prescribed topical antibiotic ointment that you may apply to feet.  You may apply aloe as well and use cool compresses.  Monitor for signs of infection that include increased redness, swelling, pus.  Do not pop blister.  Follow-up with urgent care or primary care doctor for further evaluation and management.

## 2022-06-27 NOTE — Telephone Encounter (Signed)
.  Type of form received: Long Term Disability  Additional comments:   Received by: Adonis Brook  Form should be Faxed to: [phone]  Form should be mailed to:    Is patient requesting call for pickup:   Form placed:  In provider's box  Attach charge sheet. yes  Individual made aware of 3-5 business day turn around (Y/N)?  No

## 2022-06-27 NOTE — Telephone Encounter (Signed)
Form placed on provider's desk for review and signature. 

## 2022-06-27 NOTE — ED Provider Notes (Signed)
[facility]    CSN: [id number] Arrival date & time: 06/27/22  1503      History   Chief Complaint Chief Complaint  Patient presents with   Sunburn    HPI Stephanie Chase is a 62 y.o. female.   Patient presents with sunburn to tops of bilateral feet that occurred approximately 6 days ago.  Patient reports that she was at the beach for several hours when this happened.  She states that she did have water shoes on initially but took them off.  She had clothes and pants on the rest of her body to help prevent any additional sunburn.  Patient has used cold water soaks, aloe lotion, baking soda soaks.  Reports that she had a blister that popped up a few days ago on the right upper foot.  She tried to pop it several times with clear drainage.  She denies fever, body aches, chills.     Past Medical History:  Diagnosis Date   Allergy Seasonal   Anemia    Anxiety    Asthma    Breast cancer    right, 05/13/20 completed Taxol, on Herceptin   Cervical paraspinous muscle spasm    COPD (chronic obstructive pulmonary disease)    emphysema   Depression    Discoid lupus    Discoid lupus    Emphysema of lung    Family history of bladder cancer    Family history of BRCA gene mutation    Family history of breast cancer    Family history of prostate cancer    GERD (gastroesophageal reflux disease)    Hypercholesteremia    Hypothyroidism    Neuromuscular disorder    Neuropathy    Osteoporosis    Pneumonia    Thyroid disease     Patient Active Problem List   Diagnosis Date Noted   Excessive sleepiness 01/18/2022   Intractable headache 01/18/2022   Rosacea 12/15/2021   Body aches 05/30/2021   Pain in right shoulder 03/09/2021   Polyarthralgia 03/09/2021   Neck pain on left side 06/02/2020   Chronic left-sided low back pain with left-sided sciatica 06/02/2020   Paresthesia 05/13/2020   Malignant neoplasm of right female breast 05/13/2020   Pulmonary emphysema  04/15/2020   Current smoker 04/15/2020   Other allergy status, other than to drugs and biological substances 04/15/2020   Pure hypercholesterolemia 04/15/2020   Skin sensation disturbance 04/15/2020   Spasm 04/15/2020   Osteoporosis 03/12/2020   Acquired absence of breast and absent nipple, bilateral 10/07/2019   Breast wound, right, initial encounter 10/07/2019   Breast cancer of upper-outer quadrant of right female breast 09/05/2019   Genetic testing 08/12/2019   Family history of BRCA gene mutation 07/31/2019   Family history of breast cancer    Family history of prostate cancer    Family history of bladder cancer    Malignant neoplasm of upper-outer quadrant of right breast in female, estrogen receptor positive 07/24/2019   Breast cancer 07/18/2019   Annual physical exam 03/31/2016   Occupational bronchitis 03/03/2015   Hyperlipidemia 03/30/2014   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   Generalized osteoarthritis 02/23/2012   Vitiligo 02/23/2012   Difficulty hearing 02/13/2012   Vitamin D deficiency 10/11/2010   Hypothyroidism, unspecified 06/21/2010   Lupus erythematosus 06/14/2010    Past Surgical History:  Procedure Laterality Date   ABLATION ON ENDOMETRIOSIS  [id number]   APPLICATION OF A-CELL OF EXTREMITY Right 10/22/2019   Procedure: EXCISION OF RIGHT BREAST WOUND WITH PRIMARY  CLOSURE;  Surgeon: [name] Going, DO;  Location: [place];  Service: Plastics;  Laterality: Right;   BREAST LUMPECTOMY     DEBRIDEMENT AND CLOSURE WOUND Right 10/22/2019   Procedure: Excision of right breast wound;  Surgeon: [name] Going, DO;  Location: [place];  Service: Plastics;  Laterality: Right;   MASTECTOMY W/ SENTINEL NODE BIOPSY Bilateral 09/05/2019   Procedure: BILATERAL MASTECTOMY WITH RIGHT SENTINEL LYMPH NODE BIOPSY;  Surgeon: Stark Klein, MD;  Location: [place];  Service: General;  Laterality: Bilateral;  COMBINED WITH REGIONAL FOR POST OP PAIN   PORTACATH PLACEMENT Left 09/05/2019    Procedure: INSERTION PORT-A-CATH WITH ULTRASOUND GUIDANCE;  Surgeon: Stark Klein, MD;  Location: [place];  Service: General;  Laterality: Left;   ROTATOR CUFF REPAIR Right 06/09/2021   TONSILLECTOMY     TUBAL LIGATION     WISDOM TOOTH EXTRACTION      OB History     Gravida  2   Para  2   Term      Preterm      AB      Living         SAB      IAB      Ectopic      Multiple      Live Births               Home Medications    Prior to Admission medications   Medication Sig Start Date End Date Taking? Authorizing Provider  mupirocin ointment (BACTROBAN) 2 % Apply 1 Application topically 2 (two) times daily. 06/27/22  Yes Kendalyn Cranfield, Hildred Alamin E, FNP  acetaminophen (TYLENOL) 500 MG tablet Take 500-1,000 mg by mouth every 6 (six) hours as needed for moderate pain.     [provider]  albuterol (PROAIR HFA) 108 (90 Base) MCG/ACT inhaler Inhale 2 puffs into the lungs every 6 (six) hours as needed for wheezing or shortness of breath. 01/26/22   Tawnya Crook, MD  DULoxetine (CYMBALTA) 60 MG capsule TAKE 1 CAPSULE (60 MG TOTAL) BY MOUTH DAILY. FILL CYMBALTA 30MG  DAILY FIRST 05/24/22   Marcial Pacas, MD  fluticasone [facility]) 50 MCG/ACT nasal spray Place 2 sprays into both nostrils in the morning and at bedtime. 11/26/19   Tanner, Lyndon Code., PA-C  gabapentin (NEURONTIN) 100 MG capsule Take 2 capsules (200 mg total) by mouth 2 (two) times daily. Increase in 5-7 days to 2 and then to 3 if tolerate 05/02/22   Tawnya Crook, MD  ipratropium (ATROVENT) 0.06 % nasal spray Place 2 sprays into both nostrils 4 (four) times daily. As needed for nasal congestion, runny nose 05/16/21   Lynden Oxford Scales, PA-C  levothyroxine (SYNTHROID) 75 MCG tablet Take 75 mcg by mouth daily before breakfast.    [provider]  liothyronine (CYTOMEL) 5 MCG tablet TAKE 2 TABLETS (=10MCG     TOTAL)     DAILY 03/06/18   Silverio Decamp, MD  meloxicam (MOBIC) 15 MG tablet TAKE 1 TABLET (15 MG  TOTAL) BY MOUTH AS DIRECTED. 06/25/22   Tawnya Crook, MD  tretinoin (RETIN-A) 0.025 % cream Apply topically at bedtime. 02/09/22   [provider]  triamcinolone cream (KENALOG) 0.1 % Apply 1 Application topically 2 (two) times daily. 02/09/22   [provider]  Vitamin D, Ergocalciferol, (DRISDOL) 1.25 MG (50000 UNIT) CAPS capsule TAKE 1 CAPSULE BY MOUTH ONE TIME PER WEEK 03/02/22   [name], MD    Family History  Family History  Problem Relation Age of Onset   Breast cancer Mother 79   Diabetes Mother    Bladder Cancer Mother 78       ureteral cancer   Cancer Mother        blood cancer   Asthma Mother    Depression Mother    Hearing loss Mother    Kidney disease Mother    Vision loss Mother    Dementia Father    Heart attack Father    Hyperlipidemia Father    Hypertension Father    Alzheimer's disease Father    Prostate cancer Father        dx. >46   Cancer Father    COPD Father    Hearing loss Father    Heart disease Father    Cancer Maternal Aunt    Cancer Maternal Uncle        prostate or colon   Cancer Paternal Uncle    BRCA 1/2 Daughter    Cancer Cousin        unknown type; dx. in her 50s (maternal cousin)   Alzheimer's disease Other    Breast cancer Other 2       maternal great-aunt   Sleep apnea Neg Hx     Social History Social History   Tobacco Use   Smoking status: Every Day    Packs/day: 0.50    Years: 50.00    Additional pack years: 0.00    Total pack years: 25.00    Types: Cigarettes    Start date: 1973   Smokeless tobacco: Never  Vaping Use   Vaping Use: Never used  Substance Use Topics   Alcohol use: Yes    Comment: Couple times a year   Drug use: No     Allergies   Bactrim [sulfamethoxazole-trimethoprim]   Review of Systems Review of Systems Per HPI  Physical Exam Triage Vital Signs ED Triage Vitals  Enc Vitals Group     BP 06/27/22 1753 126/87     Pulse Rate 06/27/22 1753 92     Resp 06/27/22  1753 18     Temp 06/27/22 1753 98.2 F (36.8 C)     Temp Source 06/27/22 1753 Oral     SpO2 06/27/22 1753 94 %     Weight --      Height --      Head Circumference --      Peak Flow --      Pain Score 06/27/22 1752 10     Pain Loc --      Pain Edu? --      Excl. in [place]? --    No data found.  Updated Vital Signs BP 126/87 (BP Location: Right Arm)   Pulse 92   Temp 98.2 F (36.8 C) (Oral)   Resp 18   SpO2 94%   Visual Acuity Right Eye Distance:   Left Eye Distance:   Bilateral Distance:    Right Eye Near:   Left Eye Near:    Bilateral Near:     Physical Exam Constitutional:      General: She is not in acute distress.    Appearance: Normal appearance. She is not toxic-appearing or diaphoretic.  HENT:     Head: Normocephalic and atraumatic.  Eyes:     Extraocular Movements: Extraocular movements intact.     Conjunctiva/sclera: Conjunctivae normal.  Pulmonary:     Effort: Pulmonary effort is normal.  Musculoskeletal:  Feet:  Feet:     Comments: Patient has diffuse erythema with very minimal swelling present throughout the dorsal surface of the foot, toes that stops at mid ankle.  Patient can wiggle toes bilaterally.  Capillary refill and pulses are intact.  She does have a blister that is approximately 1.5 cm in diameter present to right upper foot.  Clear drainage noted. Neurological:     General: No focal deficit present.     Mental Status: She is alert and oriented to person, place, and time. Mental status is at baseline.  Psychiatric:        Mood and Affect: Mood normal.        Behavior: Behavior normal.        Thought Content: Thought content normal.        Judgment: Judgment normal.      UC Treatments / Results  Labs (all labs ordered are listed, but only abnormal results are displayed) Labs Reviewed - No data to display  EKG   Radiology No results found.  Procedures Procedures (including critical care time)  Medications Ordered in  UC Medications - No data to display  Initial Impression / Assessment and Plan / UC Course  I have reviewed the triage vital signs and the nursing notes.  Pertinent labs & imaging results that were available during my care of the patient were reviewed by me and considered in my medical decision making (see chart for details).     Patient has superficial burn to the left foot from sunburn.  Similar burn to the right foot but she does have a blister to the dorsal surface of the foot that she popped but is mainly intact.  Will leave intact to prevent any infection.  No signs of bacterial infection on exam.  Patient is allergic to sulfa so will defer Silvadene.  Will try Bactroban to prevent infection.  Nonadherent dressing applied to blister by clinical staff to ensure no infection.  Advised dressing changes and wound care.  Advised cool compresses and supportive care.  Advised patient to monitor closely for any signs of infection and to follow-up if they occur.  She has an appointment in 1 week with PCP for follow-up.  Encouraged her to keep this appointment for follow-up and to follow-up with urgent care sooner if symptoms persist or worsen.  No signs of significant infection or complication at this time.  Patient verbalized understanding and was agreeable with plan. Final Clinical Impressions(s) / UC Diagnoses   Final diagnoses:  Superficial burn of foot, unspecified laterality, initial encounter     Discharge Instructions      I have prescribed topical antibiotic ointment that you may apply to feet.  You may apply aloe as well and use cool compresses.  Monitor for signs of infection that include increased redness, swelling, pus.  Do not pop blister.  Follow-up with urgent care or primary care doctor for further evaluation and management.    ED Prescriptions     Medication Sig Dispense Auth. Provider   mupirocin ointment (BACTROBAN) 2 % Apply 1 Application topically 2 (two) times daily. 22  g Teodora Medici, [place]      PDMP not reviewed this encounter.   Teodora Medici, [place] 06/27/22 [phone]

## 2022-06-27 NOTE — ED Triage Notes (Signed)
Pt c/o sunburn to both feet that occurred Thursday. She now has a blister to right foot.   Home interventions: cold water soaks, aloe lotion

## 2022-06-29 ENCOUNTER — Telehealth: Payer: Self-pay | Admitting: Neurology

## 2022-06-29 NOTE — Telephone Encounter (Signed)
[employer] no Roxanna Mew ref # [id number]  Sent mychart message.

## 2022-07-03 ENCOUNTER — Ambulatory Visit: Payer: BC Managed Care – PPO | Admitting: Neurology

## 2022-07-04 ENCOUNTER — Ambulatory Visit (INDEPENDENT_AMBULATORY_CARE_PROVIDER_SITE_OTHER): Payer: BC Managed Care – PPO | Admitting: Family Medicine

## 2022-07-04 ENCOUNTER — Encounter: Payer: Self-pay | Admitting: Family Medicine

## 2022-07-04 VITALS — BP 130/82 | HR 89 | Temp 98.6°F | Ht 65.0 in | Wt 174.1 lb

## 2022-07-04 DIAGNOSIS — R52 Pain, unspecified: Secondary | ICD-10-CM | POA: Diagnosis not present

## 2022-07-04 DIAGNOSIS — M2550 Pain in unspecified joint: Secondary | ICD-10-CM

## 2022-07-04 NOTE — Patient Instructions (Signed)
It was very nice to see you today!  Good luck w/surgery   PLEASE NOTE:  If you had any lab tests please let us know if you have not heard back within a few days. You may see your results on MyChart before we have a chance to review them but we will give you a call once they are reviewed by us. If we ordered any referrals today, please let us know if you have not heard from their office within the next week.   Please try these tips to maintain a healthy lifestyle:  Eat most of your calories during the day when you are active. Eliminate processed foods including packaged sweets (pies, cakes, cookies), reduce intake of potatoes, white bread, white pasta, and white rice. Look for whole grain options, oat flour or almond flour.  Each meal should contain half fruits/vegetables, one quarter protein, and one quarter carbs (no bigger than a computer mouse).  Cut down on sweet beverages. This includes juice, soda, and sweet tea. Also watch fruit intake, though this is a healthier sweet option, it still contains natural sugar! Limit to 3 servings daily.  Drink at least 1 glass of water with each meal and aim for at least 8 glasses per day  Exercise at least 150 minutes every week.   

## 2022-07-04 NOTE — Progress Notes (Signed)
Subjective:     Patient ID: Stephanie Chase, female    DOB: Jul 31, 1960, 62 y.o.   MRN: [id number]  Chief Complaint  Patient presents with   Follow-up    2 month follow-up on pain, not feeling any better     HPI Polyarthralgia-on Cymbalta 60 mg-when increases, hot flashes.  meloxicam. Gabapentin can't increase as makes too drowsy so on 100 mg at night only.  If not take for few days, pain worse.   standing for 10-59min can aggrevate pain in lower back.   Cervical disk-surgery on 4/16.  A lot of anxiety about surgery, but too much pain. Smoking down to 1/4 ppd  Gets bruises on legs and arms.  No specific injury. Not bleeding heavily.  Not on trunk.   Psychiatry-Dr. Leafy Half see end of week(s)-he mentioned changing Cymbalta-OK w/me.   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, on Herceptin   Cervical paraspinous muscle spasm    COPD (chronic obstructive pulmonary disease)    emphysema   Depression    Discoid lupus    Discoid lupus    Emphysema of lung    Family history of bladder cancer    Family history of BRCA gene mutation    Family history of breast cancer    Family history of prostate cancer    GERD (gastroesophageal reflux disease)    Hypercholesteremia    Hypothyroidism    Neuromuscular disorder    Neuropathy    Osteoporosis    Pneumonia    Thyroid disease     Past Surgical History:  Procedure Laterality Date   ABLATION ON ENDOMETRIOSIS  2016   APPLICATION OF A-CELL OF EXTREMITY Right 10/22/2019   Procedure: EXCISION OF RIGHT BREAST WOUND WITH PRIMARY CLOSURE;  Surgeon: Peggye Form, DO;  [facility];  Service: Plastics;  Laterality: Right;   BREAST LUMPECTOMY     DEBRIDEMENT AND CLOSURE WOUND Right 10/22/2019   Procedure: Excision of right breast wound;  Surgeon:  Peggye Form, DO;  Location: [facility];  Service: Plastics;  Laterality: Right;   MASTECTOMY W/ SENTINEL NODE BIOPSY Bilateral 09/05/2019   Procedure: BILATERAL MASTECTOMY WITH RIGHT SENTINEL LYMPH NODE BIOPSY;  Surgeon: Almond Lint, MD;  Location[facility];  Service: General;  Laterality: Bilateral;  COMBINED WITH REGIONAL FOR POST OP PAIN   PORTACATH PLACEMENT Left 09/05/2019   Procedure: INSERTION PORT-A-CATH WITH ULTRASOUND GUIDANCE;  Surgeon: Almond Lint, MD;  Location[facility];  Service: General;  Laterality: Left;   ROTATOR CUFF REPAIR Right 06/09/2021   TONSILLECTOMY     TUBAL LIGATION     WISDOM TOOTH EXTRACTION      Outpatient Medications Prior to Visit  Medication Sig Dispense Refill   acetaminophen (TYLENOL) 500 MG tablet Take 500-1,000 mg by mouth every 6 (six) hours as needed for moderate pain.      albuterol (PROAIR HFA) 108 (90 Base) MCG/ACT inhaler Inhale 2 puffs into the lungs every 6 (six) hours as needed for wheezing or shortness of breath. 1 each 5   cetirizine (ZYRTEC) 10 MG tablet Take by mouth.     DULoxetine (CYMBALTA) 60 MG capsule TAKE 1 CAPSULE (60 MG TOTAL) BY MOUTH DAILY. FILL CYMBALTA 30MG  DAILY FIRST 90 capsule 3  fluticasone (FLONASE) 50 MCG/ACT nasal spray Place 2 sprays into both nostrils in the morning and at bedtime. 16 g 5   gabapentin (NEURONTIN) 100 MG capsule Take 2 capsules (200 mg total) by mouth 2 (two) times daily. Increase in 5-7 days to 2 and then to 3 if tolerate 360 capsule 1   ipratropium (ATROVENT) 0.06 % nasal spray Place 2 sprays into both nostrils 4 (four) times daily. As needed for nasal congestion, runny nose 15 mL 0   levothyroxine (SYNTHROID) 75 MCG tablet Take 75 mcg by mouth daily before breakfast.     liothyronine (CYTOMEL) 5 MCG tablet TAKE 2 TABLETS (=10MCG     TOTAL)     DAILY 180 tablet 1   LORazepam (ATIVAN) 0.5 MG tablet PLEASE SEE ATTACHED FOR DETAILED DIRECTIONS     meloxicam (MOBIC) 15 MG tablet TAKE 1 TABLET (15 MG  TOTAL) BY MOUTH AS DIRECTED. 30 tablet 1   Vitamin D, Ergocalciferol, (DRISDOL) 1.25 MG (50000 UNIT) CAPS capsule TAKE 1 CAPSULE BY MOUTH ONE TIME PER WEEK 12 capsule 4   mupirocin ointment (BACTROBAN) 2 % Apply 1 Application topically 2 (two) times daily. 22 g 0   tretinoin (RETIN-A) 0.025 % cream Apply topically at bedtime.     triamcinolone cream (KENALOG) 0.1 % Apply 1 Application topically 2 (two) times daily.     No facility-administered medications prior to visit.    Allergies  Allergen Reactions   Bactrim [Sulfamethoxazole-Trimethoprim] Itching and Rash   ROS neg/noncontributory except as noted HPI/below Sunburned feet at beach-blistered.  Was seen at urgent care-better  mupirocin.      Objective:     BP 130/82   Pulse 89   Temp 98.6 F (37 C) (Temporal)   Ht 5\' 5"  (1.651 m)   Wt 174 lb 2 oz (79 kg)   SpO2 96%   BMI 28.98 kg/m  Wt Readings from Last 3 Encounters:  07/04/22 174 lb 2 oz (79 kg)  05/16/22 178 lb (80.7 kg)  05/02/22 175 lb 6 oz (79.5 kg)    Physical Exam   Gen: WDWN NAD HEENT: NCAT, conjunctiva not injected, sclera nonicteric NECK:  supple, no thyromegaly, no nodes, no carotid bruits CARDIAC: RRR, S1S2+, no murmur. DP 2+B LUNGS: CTAB. No wheezes EXT:  no edema MSK: no gross abnormalities.  NEURO: A&O x3.  CN II-XII intact.  PSYCH: normal mood. Good eye contact.  Slow to answer. vitiligo     Assessment & Plan:   Problem List Items Addressed This Visit       Other   Polyarthralgia   Body aches - Primary   Arthralgias-chronic.  Intolerant gabapentin at doses >100 mg.  Some benefit, so continue 100 mg at bed.  As Cymbalta not work and getting flashes-ok for psychiatry to change. Cervical radiculopathy-going for surgery next week(s).  Follow up 2673m  No orders of the defined types were placed in this encounter.   Angelena SoleAnn M Philena Obey, MD

## 2022-07-06 NOTE — Telephone Encounter (Signed)
Form faxed back to [facility].

## 2022-07-18 ENCOUNTER — Ambulatory Visit: Payer: BC Managed Care – PPO | Admitting: Neurology

## 2022-08-09 ENCOUNTER — Other Ambulatory Visit: Payer: Self-pay | Admitting: *Deleted

## 2022-08-09 ENCOUNTER — Encounter: Payer: Self-pay | Admitting: Family Medicine

## 2022-08-09 DIAGNOSIS — B001 Herpesviral vesicular dermatitis: Secondary | ICD-10-CM

## 2022-08-09 MED ORDER — ACYCLOVIR 5 % EX OINT
TOPICAL_OINTMENT | CUTANEOUS | Status: DC
Start: 2022-08-09 — End: 2022-08-09

## 2022-08-09 MED ORDER — ACYCLOVIR 5 % EX OINT: 1.0000 | TOPICAL_OINTMENT | CUTANEOUS | 0 refills | Status: DC

## 2022-08-09 NOTE — Addendum Note (Signed)
Addended by: Jobe Gibbon on: 08/09/2022 05:13 PM   Modules accepted: Orders

## 2022-08-10 ENCOUNTER — Encounter: Payer: Self-pay | Admitting: Hematology & Oncology

## 2022-08-10 ENCOUNTER — Inpatient Hospital Stay (HOSPITAL_BASED_OUTPATIENT_CLINIC_OR_DEPARTMENT_OTHER): Payer: BC Managed Care – PPO | Admitting: Hematology & Oncology

## 2022-08-10 ENCOUNTER — Inpatient Hospital Stay: Payer: BC Managed Care – PPO

## 2022-08-10 ENCOUNTER — Other Ambulatory Visit: Payer: Self-pay

## 2022-08-10 ENCOUNTER — Inpatient Hospital Stay: Payer: BC Managed Care – PPO | Attending: Hematology

## 2022-08-10 VITALS — BP 113/85 | HR 96 | Temp 98.1°F | Resp 16 | Ht 65.0 in | Wt 174.0 lb

## 2022-08-10 DIAGNOSIS — Z170 Estrogen receptor positive status [ER+]: Secondary | ICD-10-CM | POA: Diagnosis not present

## 2022-08-10 DIAGNOSIS — M159 Polyosteoarthritis, unspecified: Secondary | ICD-10-CM

## 2022-08-10 DIAGNOSIS — C50411 Malignant neoplasm of upper-outer quadrant of right female breast: Secondary | ICD-10-CM

## 2022-08-10 DIAGNOSIS — Z853 Personal history of malignant neoplasm of breast: Secondary | ICD-10-CM | POA: Insufficient documentation

## 2022-08-10 DIAGNOSIS — M818 Other osteoporosis without current pathological fracture: Secondary | ICD-10-CM | POA: Diagnosis not present

## 2022-08-10 DIAGNOSIS — Z08 Encounter for follow-up examination after completed treatment for malignant neoplasm: Secondary | ICD-10-CM | POA: Diagnosis present

## 2022-08-10 LAB — VITAMIN D 25 HYDROXY (VIT D DEFICIENCY, FRACTURES): Vit D, 25-Hydroxy: 86.89 ng/mL (ref 30–100)

## 2022-08-10 LAB — CMP (CANCER CENTER ONLY)
ALT: 13 U/L (ref 0–44)
AST: 18 U/L (ref 15–41)
Albumin: 4.6 g/dL (ref 3.5–5.0)
Alkaline Phosphatase: 107 U/L (ref 38–126)
Anion gap: 8 (ref 5–15)
BUN: 10 mg/dL (ref 6–20)
CO2: 25 mmol/L (ref 22–32)
Calcium: 11.3 mg/dL — ABNORMAL HIGH (ref 8.9–10.3)
Chloride: 105 mmol/L (ref 98–111)
Creatinine: 0.73 mg/dL (ref 0.44–1.00)
GFR, Estimated: >60 mL/min (ref >60)
Glucose, Bld: 121 mg/dL — ABNORMAL HIGH (ref 70–99)
Potassium: 3.7 mmol/L (ref 3.5–5.1)
Sodium: 138 mmol/L (ref 135–145)
Total Bilirubin: 0.3 mg/dL (ref 0.3–1.2)
Total Protein: 7.6 g/dL (ref 6.5–8.1)

## 2022-08-10 LAB — CBC WITH DIFFERENTIAL (CANCER CENTER ONLY)
Abs Immature Granulocytes: 0.02 10*3/uL (ref 0.00–0.07)
Basophils Absolute: 0.1 10*3/uL (ref 0.0–0.1)
Basophils Relative: 1 %
Eosinophils Absolute: 0.1 10*3/uL (ref 0.0–0.5)
Eosinophils Relative: 2 %
HCT: 45.4 % (ref 36.0–46.0)
Hemoglobin: 15.3 g/dL — ABNORMAL HIGH (ref 12.0–15.0)
Immature Granulocytes: 0 %
Lymphocytes Relative: 20 %
Lymphs Abs: 1.8 10*3/uL (ref 0.7–4.0)
MCH: 28.7 pg (ref 26.0–34.0)
MCHC: 33.7 g/dL (ref 30.0–36.0)
MCV: 85.2 fL (ref 80.0–100.0)
Monocytes Absolute: 0.6 10*3/uL (ref 0.1–1.0)
Monocytes Relative: 7 %
Neutro Abs: 6.3 10*3/uL (ref 1.7–7.7)
Neutrophils Relative %: 70 %
Platelet Count: 305 10*3/uL (ref 150–400)
RBC: 5.33 MIL/uL — ABNORMAL HIGH (ref 3.87–5.11)
RDW: 13.2 % (ref 11.5–15.5)
WBC Count: 8.9 10*3/uL (ref 4.0–10.5)
nRBC: 0 % (ref 0.0–0.2)

## 2022-08-10 LAB — TSH: TSH: 0.41 u[IU]/mL (ref 0.350–4.500)

## 2022-08-10 LAB — LACTATE DEHYDROGENASE: LDH: 162 U/L (ref 98–192)

## 2022-08-10 MED ORDER — TRAZODONE HCL 50 MG PO TABS: 50.0000 mg | ORAL_TABLET | Freq: Every day | ORAL | 3 refills | Status: DC

## 2022-08-10 MED ORDER — DENOSUMAB 60 MG/ML ~~LOC~~ SOSY
60.0000 mg | PREFILLED_SYRINGE | Freq: Once | SUBCUTANEOUS | Status: AC
  Administered 2022-08-10: 60 mg via SUBCUTANEOUS
  Filled 2022-08-10: qty 1

## 2022-08-10 NOTE — Progress Notes (Signed)
Hematology and Oncology Follow Up Visit  Stephanie Chase [id number] 08/12/1960 61 y.o. 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 6 months- next dose in 01/2023     Interim History:  Stephanie Chase is back for follow-up.  Surprisingly, she had neck surgery recently.  This was at the C5 and C6 level.  I think this was about a month or so ago.  I did state that she needed to have neck surgery.  She said that it did help a little bit.  There is much less pain in the right arm.  It looks that she got through the surgery pretty well.  She has got a very busy summer.  She will spend most of it out on the [place].  She has high school graduation of her granddaughter.  She will then go on a [place] cruise.  She is having pain in the right hip.  I know she has had some arthritis before.  This may be a little bit of bursitis.  As always, we will have to assess this.  Given that she has breast cancer, you cannot be too careful as to whether or not there is metastatic disease.  She also feels that there is a lymph node in the left axilla.  I am unsure exactly what might be in that area.  I know her breast cancer was on the right side.  We will see up to see about doing a ultrasound.  She is still smoking.  She stopped for a little bit when she had her neck surgery.  However she is back to smoking.  She has had no change in bowel or bladder habits.  She has had continued problems with her memory.  It is hard for her to work because of her lack of memory and difficult for her to find the right words.  She says having hard time sleeping.  I will have to see about called in some  trazodone (50 mg p.o. nightly) and let see if this may help her.  She has had no bleeding.  Overall, I would say performance status is probably ECOG 1.     Medications:  Current Outpatient Medications:    acyclovir ointment (ZOVIRAX) 5 %, Apply 1 Application topically every 3 (three) hours., Disp: 30 g, Rfl: 0   acetaminophen (TYLENOL) 500 MG tablet, Take 500-1,000 mg by mouth every 6 (six) hours as needed for moderate pain. , Disp: , Rfl:    albuterol (PROAIR HFA) 108 (90 Base) MCG/ACT inhaler, Inhale 2 puffs into the lungs every 6 (six) hours as needed for wheezing or shortness of breath., Disp: 1 each, Rfl: 5   cetirizine (ZYRTEC) 10 MG tablet, Take by mouth., Disp: , Rfl:    DULoxetine (CYMBALTA) 60 MG capsule, TAKE 1 CAPSULE (60 MG TOTAL) BY MOUTH DAILY. FILL CYMBALTA 30MG  DAILY FIRST, Disp: 90 capsule, Rfl: 3   fluticasone (FLONASE) 50 MCG/ACT nasal spray, Place 2 sprays into both nostrils in the morning and at bedtime., Disp: 16 g, Rfl: 5   gabapentin (NEURONTIN) 100 MG capsule, Take 2 capsules (200 mg total) by mouth 2 (two) times daily. Increase in 5-7 days to 2 and then to 3 if tolerate,  Disp: 360 capsule, Rfl: 1   ipratropium (ATROVENT) 0.06 % nasal spray, Place 2 sprays into both nostrils 4 (four) times daily. As needed for nasal congestion, runny nose, Disp: 15 mL, Rfl: 0   levothyroxine (SYNTHROID) 75 MCG tablet, Take 75 mcg by mouth daily before breakfast., Disp: , Rfl:    liothyronine (CYTOMEL) 5 MCG tablet, TAKE 2 TABLETS (=10MCG     TOTAL)     DAILY, Disp: 180 tablet, Rfl: 1   LORazepam (ATIVAN) 0.5 MG tablet, PLEASE SEE ATTACHED FOR DETAILED DIRECTIONS, Disp: , Rfl:    meloxicam (MOBIC) 15 MG tablet, TAKE 1 TABLET (15 MG TOTAL) BY MOUTH AS DIRECTED., Disp: 30 tablet, Rfl: 1   Vitamin D, Ergocalciferol, (DRISDOL) 1.25 MG (50000 UNIT) CAPS capsule, TAKE 1 CAPSULE BY MOUTH ONE TIME PER WEEK, Disp: 12 capsule, Rfl: 4  Allergies:  Allergies  Allergen Reactions   Bactrim  [Sulfamethoxazole-Trimethoprim] Itching and Rash    Past Medical History, Surgical history, Social history, and Family History were reviewed and updated.  Review of Systems: Review of Systems  Constitutional:  Positive for fatigue.  HENT:   Positive for hearing loss and tinnitus.   Eyes:  Positive for eye problems.  Respiratory:  Positive for cough.   Cardiovascular: Negative.   Gastrointestinal:  Positive for abdominal pain and nausea.  Endocrine: Negative.   Genitourinary: Negative.    Musculoskeletal:  Positive for arthralgias and myalgias.  Skin: Negative.   Neurological:  Positive for light-headedness.  Hematological: Negative.   Psychiatric/Behavioral: Negative.      Physical Exam:  height is 5\' 5"  (1.651 m) and weight is 174 lb (78.9 kg). Her oral temperature is 98.1 F (36.7 C). Her blood pressure is 113/85 and her pulse is 96. Her respiration is 16 and oxygen saturation is 95%.   Wt Readings from Last 3 Encounters:  08/10/22 174 lb (78.9 kg)  07/04/22 174 lb 2 oz (79 kg)  05/16/22 178 lb (80.7 kg)    Physical Exam Vitals reviewed.  Constitutional:      Comments: She has had bilateral mastectomies.  Mastectomy scars are well-healed.  There might be some nodularity along the mastectomy site.  Again I have to believe this is scar tissue.  There is no erythema or warmth or swelling associated with these.  I cannot palpate any bilateral axillary lymph nodes.  HENT:     Head: Normocephalic and atraumatic.  Eyes:     Pupils: Pupils are equal, round, and reactive to light.  Cardiovascular:     Rate and Rhythm: Normal rate and regular rhythm.     Heart sounds: Normal heart sounds.  Pulmonary:     Effort: Pulmonary effort is normal.     Breath sounds: Normal breath sounds.  Abdominal:     General: Bowel sounds are normal.     Palpations: Abdomen is soft.  Musculoskeletal:        General: No tenderness or deformity. Normal range of motion.     Cervical back: Normal  range of motion.  Lymphadenopathy:     Cervical: No cervical adenopathy.  Skin:    General: Skin is warm and dry.     Findings: No erythema or rash.  Neurological:     Mental Status: She is alert and oriented to person, place, and time.  Psychiatric:        Behavior: Behavior normal.        Thought Content: Thought content normal.        Judgment:  Judgment normal.    Lab Results  Component Value Date   WBC 8.9 08/10/2022   HGB 15.3 (H) 08/10/2022   HCT 45.4 08/10/2022   MCV 85.2 08/10/2022   PLT 305 08/10/2022     Chemistry      Component Value Date/Time   NA 138 08/10/2022 0931   K 3.7 08/10/2022 0931   CL 105 08/10/2022 0931   CO2 25 08/10/2022 0931   BUN 10 08/10/2022 0931   CREATININE 0.73 08/10/2022 0931   CREATININE 0.66 09/12/2017 0747      Component Value Date/Time   CALCIUM 11.3 (H) 08/10/2022 0931   ALKPHOS 107 08/10/2022 0931   AST 18 08/10/2022 0931   ALT 13 08/10/2022 0931   BILITOT 0.3 08/10/2022 0931      Impression and Plan: Ms. Bacon is a very nice 62 year old postmenopausal female.  She is originally from [place].  She moved down to [facility].  She has 2 grandchildren already.  Her children live on the [place].  She has early stage breast cancer.  Unfortunately, it was HER-2 positive.  Because this, she required chemotherapy with Herceptin.  Because of the chemotherapy, she has had side effects.  So far, these side effects have not let up.  I am not sure if she will ever have a significant improvement in her quality of life.   I hate that she had neck surgery.  However, this did not seem to help her a little bit.  Will have to get the radiologic studies for her right hip and for her left axilla.  I think this will be helpful to make sure that there is nothing going on.  I know this will give her some peace of mind.  I would like to make sure we get these before she goes out [place].  We will plan to get her back to see [place] likely  after she returns from her vacation out [place].   Josph Macho, MD 5/16/202410:11 AM

## 2022-08-10 NOTE — Patient Instructions (Signed)
Denosumab Injection (Osteoporosis) What is this medication? DENOSUMAB (den oh SUE mab) prevents and treats osteoporosis. It works by making your bones stronger and less likely to break (fracture). It is a monoclonal antibody. This medicine may be used for other purposes; ask your health care provider or pharmacist if you have questions. COMMON BRAND NAME(S): Prolia What should I tell my care team before I take this medication? They need to know if you have any of these conditions: Dental or gum disease, or plan to have dental surgery or a tooth pulled Infection Kidney disease Low levels of calcium or vitamin D in your blood On dialysis Poor nutrition Skin conditions Thyroid disease, or have had thyroid or parathyroid surgery Trouble absorbing minerals in your stomach or intestine An unusual or allergic reaction to denosumab, other medications, foods, dyes, or preservatives Pregnant or trying to get pregnant Breastfeeding How should I use this medication? This medication is injected under the skin. It is given by your care team in a hospital or clinic setting. A special MedGuide will be given to you before each treatment. Be sure to read this information carefully each time. Talk to your care team about the use of this medication in children. Special care may be needed. Overdosage: If you think you have taken too much of this medicine contact a poison control center or emergency room at once. NOTE: This medicine is only for you. Do not share this medicine with others. What if I miss a dose? Keep appointments for follow-up doses. It is important not to miss your dose. Call your care team if you are unable to keep an appointment. What may interact with this medication? Do not take this medication with any of the following: Other medications that contain denosumab This medication may also interact with the following: Medications that lower your chance of fighting infection Steroid  medications, such as prednisone or cortisone This list may not describe all possible interactions. Give your health care provider a list of all the medicines, herbs, non-prescription drugs, or dietary supplements you use. Also tell them if you smoke, drink alcohol, or use illegal drugs. Some items may interact with your medicine. What should I watch for while using this medication? Your condition will be monitored carefully while you are receiving this medication. You may need blood work while taking this medication. This medication may increase your risk of getting an infection. Call your care team for advice if you get a fever, chills, sore throat, or other symptoms of a cold or flu. Do not treat yourself. Try to avoid being around people who are sick. Tell your dentist and dental surgeon that you are taking this medication. You should not have major dental surgery while on this medication. See your dentist to have a dental exam and fix any dental problems before starting this medication. Take good care of your teeth while on this medication. Make sure you see your dentist for regular follow-up appointments. You should make sure you get enough calcium and vitamin D while you are taking this medication. Discuss the foods you eat and the vitamins you take with your care team. Talk to your care team if you are pregnant or think you might be pregnant. This medication can cause serious birth defects if taken during pregnancy and for 5 months after the last dose. You will need a negative pregnancy test before starting this medication. Contraception is recommended while taking this medication and for 5 months after the last dose. Your care   team can help you find the option that works for you. Talk to your care team before breastfeeding. Changes to your treatment plan may be needed. What side effects may I notice from receiving this medication? Side effects that you should report to your care team as soon as  possible: Allergic reactions--skin rash, itching, hives, swelling of the face, lips, tongue, or throat Infection--fever, chills, cough, sore throat, wounds that don't heal, pain or trouble when passing urine, general feeling of discomfort or being unwell Low calcium level--muscle pain or cramps, confusion, tingling, or numbness in the hands or feet Osteonecrosis of the jaw--pain, swelling, or redness in the mouth, numbness of the jaw, poor healing after dental work, unusual discharge from the mouth, visible bones in the mouth Severe bone, joint, or muscle pain Skin infection--skin redness, swelling, warmth, or pain Side effects that usually do not require medical attention (report these to your care team if they continue or are bothersome): Back pain Headache Joint pain Muscle pain Pain in the hands, arms, legs, or feet Runny or stuffy nose Sore throat This list may not describe all possible side effects. Call your doctor for medical advice about side effects. You may report side effects to FDA at 1-800-FDA-1088. Where should I keep my medication? This medication is given in a hospital or clinic. It will not be stored at home. NOTE: This sheet is a summary. It may not cover all possible information. If you have questions about this medicine, talk to your doctor, pharmacist, or health care provider.  2023 Elsevier/Gold Standard (2021-07-25 00:00:00)  

## 2022-08-11 ENCOUNTER — Encounter: Payer: Self-pay | Admitting: Family Medicine

## 2022-08-11 ENCOUNTER — Encounter: Payer: Self-pay | Admitting: *Deleted

## 2022-08-11 ENCOUNTER — Other Ambulatory Visit: Payer: Self-pay

## 2022-08-11 DIAGNOSIS — E038 Other specified hypothyroidism: Secondary | ICD-10-CM

## 2022-08-11 LAB — CANCER ANTIGEN 27.29: CA 27.29: 22.5 U/mL (ref 0.0–38.6)

## 2022-08-11 MED ORDER — LEVOTHYROXINE SODIUM 75 MCG PO TABS
75.0000 ug | ORAL_TABLET | Freq: Every day | ORAL | 3 refills | Status: DC
Start: 2022-08-11 — End: 2023-05-08

## 2022-08-22 ENCOUNTER — Ambulatory Visit (INDEPENDENT_AMBULATORY_CARE_PROVIDER_SITE_OTHER): Payer: BC Managed Care – PPO | Admitting: Neurology

## 2022-08-22 DIAGNOSIS — R0683 Snoring: Secondary | ICD-10-CM | POA: Diagnosis not present

## 2022-08-22 DIAGNOSIS — R519 Headache, unspecified: Secondary | ICD-10-CM

## 2022-08-22 DIAGNOSIS — Z9189 Other specified personal risk factors, not elsewhere classified: Secondary | ICD-10-CM

## 2022-08-22 DIAGNOSIS — G4734 Idiopathic sleep related nonobstructive alveolar hypoventilation: Secondary | ICD-10-CM

## 2022-08-22 DIAGNOSIS — E663 Overweight: Secondary | ICD-10-CM

## 2022-08-22 DIAGNOSIS — G4719 Other hypersomnia: Secondary | ICD-10-CM

## 2022-08-22 DIAGNOSIS — G4720 Circadian rhythm sleep disorder, unspecified type: Secondary | ICD-10-CM

## 2022-08-24 ENCOUNTER — Ambulatory Visit
Admission: RE | Admit: 2022-08-24 | Discharge: 2022-08-24 | Disposition: A | Discharge status: Home / Self Care | Payer: BC Managed Care – PPO | Source: Ambulatory Visit | Attending: Hematology & Oncology | Admitting: Hematology & Oncology

## 2022-08-24 DIAGNOSIS — Z170 Estrogen receptor positive status [ER+]: Secondary | ICD-10-CM

## 2022-08-24 DIAGNOSIS — M159 Polyosteoarthritis, unspecified: Secondary | ICD-10-CM

## 2022-08-27 ENCOUNTER — Other Ambulatory Visit: Payer: Self-pay | Admitting: Family Medicine

## 2022-08-28 NOTE — Procedures (Signed)
Physician Interpretation:     [facility] POLYSOMNOGRAPHY  INTERPRETATION REPORT   STUDY DATE:  08/22/2022     PATIENT NAME:  Stephanie Chase         DATE OF BIRTH:  Feb 11, 1961  PATIENT ID:  [id number]    TYPE OF STUDY:  PSG  READING PHYSICIAN: Huston Foley, MD, PhD   SCORING TECHNICIAN: Domingo Cocking, RPSGT   Referred by: Dr. Levert Feinstein  History and Indication for Testing: 62 year old female with an underlying medical history of anemia, allergies, asthma, breast cancer, COPD, depression, anxiety, reflux disease, hypothyroidism, hyperlipidemia, neuropathy, recurrent headaches, osteoporosis, and overweight state, who reports snoring and excessive daytime somnolence. Her Epworth sleepiness score is 8 out of 24, fatigue severity score is 43 out of 63.  Height: 65 in Weight: 176 lb (BMI 29) Neck Size: 15 in    MEDICATIONS: Tylenol, Proair HFA, Cymbalta, Vitamin D2, Flonase, Neurontin, Atrovent, Synthroid, Cytomel, Mobic, Retin-A, Kenalog   TECHNICAL DESCRIPTION: A registered sleep technologist was in attendance for the duration of the recording.  Data collection, scoring, video monitoring, and reporting were performed in compliance with the AASM Manual for the Scoring of Sleep and Associated Events; (Hypopnea is scored based on the criteria listed in Section VIII D. 1b in the AASM Manual V2.6 using a 4% oxygen desaturation rule or Hypopnea is scored based on the criteria listed in Section VIII D. 1a in the AASM Manual V2.6 using 3% oxygen desaturation and /or arousal rule).   SLEEP CONTINUITY AND SLEEP ARCHITECTURE:  Lights-out was at 21:19: and lights-on at  03:34:, with a total recording time of 6 hours, 16 min. Total sleep time ( TST) was 283.0 minutes with a decreased sleep efficiency at 75.5%. There was 0% of REM sleep.   BODY POSITION:  TST was divided  between the following sleep positions: 42.4% supine;  57.6% lateral;  0% prone. Duration of total sleep  and percent of total sleep in their respective position is as follows: supine 120 minutes (42%), non-supine 163 minutes (58%); right 67 minutes (24%), left 96 minutes (34%), and prone 00 minutes (0%).  Total supine REM sleep time was 00 minutes (0% of total REM sleep).  Sleep latency was normal at 24.5 minutes.  REM sleep was absent. Of the total sleep time, the percentage of stage N1 sleep was 12.0%, which is increased, stage N2 sleep was 73%, which is markedly increased, stage N3 sleep was 14.7%, and REM sleep was absent. Wake after sleep onset (WASO) time accounted for 68 minutes with mild to moderate sleep fragmentation noted.   RESPIRATORY MONITORING:   Based on CMS criteria (using a 4% oxygen desaturation rule for scoring hypopneas), there were 2 apneas (2 obstructive; 0 central; 0 mixed), and 5 hypopneas.  Apnea index was 0.4. Hypopnea index was 1.1. The apnea-hypopnea index was 1.5 overall (2.5 supine, 0 non-supine; 0.0 REM, 0.0 supine REM).  There were 1 respiratory effort-related arousals (RERAs).  The RERA index was 0 events/h. Total respiratory disturbance index (RDI) was 1.7 events/h. RDI results showed: supine RDI  3.0 /h; non-supine RDI 0.7 /h; REM RDI 0.0 /h, supine REM RDI 0.0 /h.   Based on AASM criteria (using a 3% oxygen desaturation and /or arousal rule for scoring hypopneas), there were 2 apneas (2 obstructive; 0 central; 0 mixed), and 8 hypopneas. Apnea index was 0.4. Hypopnea index was 1.7. The apnea-hypopnea index was 2.1/hour overall (4.0 supine, 0 non-supine; 0.0 REM, 0.0 supine REM).  There were 1 respiratory effort-related arousals (RERAs).  The RERA index was 0 events/h. Total respiratory disturbance index (RDI) was 2.3 events/h. RDI results showed: supine RDI  4.5 /h; non-supine RDI 0.7 /h; REM RDI 0.0 /h, supine REM RDI 0.0 /h.    OXIMETRY: Oxyhemoglobin Saturation Nadir during sleep was at  86% from a mean of 91%.  Of the Total sleep time (TST)   hypoxemia (=<88%) was  present for  25.0 minutes, or 8.8% of total sleep time.   LIMB MOVEMENTS: There were 0 periodic limb movements of sleep (0.0/hr), of which 0 (0.0/hr) were associated with an arousal.   AROUSAL: There were 105 arousals in total, for an arousal index of 22 arousals/hour.  Of these, 8 were identified as respiratory-related arousals (2 /h), 0 were PLM-related arousals (0 /h), and 109 were non-specific arousals (23 /h).   EEG: Review of the EEG showed no abnormal electrical discharges and symmetrical bihemispheric findings.    EKG: The EKG revealed normal sinus rhythm (NSR). The average heart rate during sleep was 73 bpm.   AUDIO/VIDEO REVIEW: The audio and video review did not show any abnormal or unusual behaviors, movements, phonations or vocalizations. The patient took no restroom breaks. Snoring was noted, mild and intermittent.  POST-STUDY QUESTIONNAIRE: Post study, the patient indicated, that sleep was better than usual.   IMPRESSION:  1. Primary Snoring 2. Oxygen desaturations during sleep 3. Dysfunctions associated with sleep stages or arousal from sleep  RECOMMENDATIONS:  1. This study does not demonstrate any significant obstructive or central sleep disordered breathing with an AHI of less than 5/hour -her total AHI was 2.1/h, O2 nadir 86%. She had an average oxygen saturation of 91% and time below or at 88% saturation of 25 minutes for the night. Of note, the absence of REM sleep during this test may have led to an underestimation of her sleep disordered breathing.  Mild intermittent snoring was noted. Treatment with a positive airway pressure device, such as CPAP or autoPAP is not indicated, based on this test. Some weight loss may aid in reducing her snoring. She will be advised to follow up with primary care regarding her oxygen desaturations during sleep and further evaluation. Optimization of COPD management and complete smoking cessation are recommended.  2. This study shows sleep  fragmentation and abnormal sleep stage percentages; these are nonspecific findings and per se do not signify an intrinsic sleep disorder or a cause for the patient's sleep-related symptoms. Causes include (but are not limited to) the first night effect of the sleep study, circadian rhythm disturbances, medication effect or an underlying mood disorder or medical problem.  3. The patient should be cautioned not to drive, work at heights, or operate dangerous or heavy equipment when tired or sleepy. Review and reiteration of good sleep hygiene measures should be pursued with any patient. 4. The patient will be advised to follow up with the referring provider, who will be notified of the test results.   I certify that I have reviewed the entire raw data recording prior to the issuance of this report in accordance with the Standards of Accreditation of the American Academy of Sleep Medicine (AASM).  Huston Foley, MD, PhD Medical Director, [facility]) Diplomat, ABPN (Neurology and Sleep)               Technical Report:   General Information  Name: Stephanie, Chase BMI: 29.29 Physician: Huston Foley, MD  ID: [id number] Height: 65.0 in  Technician: Domingo Cocking, RPSGT  Sex: Female Weight: 176.0 lb Record: [id number]  Age: 28 [11/25/1960] Date: 08/22/2022    Medical & Medication History    Ms. Augusta is a 62 year old female with an underlying medical history of anemia, allergies, asthma, breast cancer, COPD, depression, anxiety, reflux disease, hypothyroidism, hyperlipidemia, neuropathy, recurrent headaches, osteoporosis, and overweight state, who reports snoring and excessive daytime somnolence. Her Epworth sleepiness score is 8 out of 24, fatigue severity score is 43 out of 63.  Tylenol, Proair HFA, Cymbalta, Vitamin D2, Flonase, Neurontin, Atrovent, Synthroid, Cytomel, Mobic, Retin-A, Kenalog   Sleep Disorder      Comments   Patient arrived  for a diagnostic polysomnogram. Procedure explained and all questions answered. Standard paste setup without complications. Patient slept supine, left, and right. Mild intermittent snoring heard. A few respiratory events observed. Fragmented sleep, frequent arousals. No obvious cardiac arrhythmias observed. No significant PLMS observed. No restroom visit.     Lights out: 09:19:31 PM Lights on: 03:35:22 AM   Time Total Supine Side Prone Upright  Recording (TRT) 6h 16.74m 3h 6.30m 3h 10.33m 0h 0.76m 0h 0.31m  Sleep (TST) 4h 43.18m 2h 0.19m 2h 43.34m 0h 0.66m 0h 0.81m   Latency N1 N2 N3 REM Onset Per. Slp. Eff.  Actual 0h 0.2m 0h 10.77m 1h 40.90m 0h 0.68m 0h 24.44m 0h 37.45m 75.40%   Stg Dur Wake N1 N2 N3 REM  Total 92.5 34.0 207.5 41.5 0.0  Supine 65.5 18.0 61.5 40.5 0.0  Side 27.0 16.0 146.0 1.0 0.0  Prone 0.0 0.0 0.0 0.0 0.0  Upright 0.0 0.0 0.0 0.0 0.0   Stg % Wake N1 N2 N3 REM  Total 24.6 12.0 73.2 14.6 0.0  Supine 17.4 6.3 21.7 14.3 0.0  Side 7.2 5.6 51.5 0.4 0.0  Prone 0.0 0.0 0.0 0.0 0.0  Upright 0.0 0.0 0.0 0.0 0.0     Apnea Summary Sub Supine Side Prone Upright  Total 2 Total 2 2 0 0 0    REM 0 0 0 0 0    NREM 2 2 0 0 0  Obs 2 REM 0 0 0 0 0    NREM 2 2 0 0 0  Mix 0 REM 0 0 0 0 0    NREM 0 0 0 0 0  Cen 0 REM 0 0 0 0 0    NREM 0 0 0 0 0   Rera Summary Sub Supine Side Prone Upright  Total 1 Total 1 1 0 0 0    REM 0 0 0 0 0    NREM 1 1 0 0 0   Hypopnea Summary Sub Supine Side Prone Upright  Total 8 Total 8 6 2  0 0    REM 0 0 0 0 0    NREM 8 6 2  0 0   4% Hypopnea Summary Sub Supine Side Prone Upright  Total (4%) 5 Total 5 3 2  0 0    REM 0 0 0 0 0    NREM 5 3 2  0 0     AHI Total Obs Mix Cen  2.12 Apnea 0.42 0.42 0.00 0.00   Hypopnea 1.69 -- -- --  1.48 Hypopnea (4%) 1.06 -- -- --    Total Supine Side Prone Upright  Position AHI 2.12 3.98 0.74 0.00 0.00  REM AHI 0.00   NREM AHI 2.12   Position RDI 2.33 4.48 0.74 0.00 0.00  REM RDI 0.00   NREM RDI 2.33    4% Hypopnea  Total Supine Side Prone Upright  Position AHI (4%) 1.48 2.49 0.74 0.00 0.00  REM AHI (4%) 0.00   NREM AHI (4%) 1.48   Position RDI (4%) 1.69 2.99 0.74 0.00 0.00  REM RDI (4%) 0.00   NREM RDI (4%) 1.70    Desaturation Information Threshold: 2% <100% <90% <80% <70% <60% <50% <40%  Supine 89.0 79.0 0.0 0.0 0.0 0.0 0.0  Side 68.0 43.0 0.0 0.0 0.0 0.0 0.0  Prone 0.0 0.0 0.0 0.0 0.0 0.0 0.0  Upright 0.0 0.0 0.0 0.0 0.0 0.0 0.0  Total 157.0 122.0 0.0 0.0 0.0 0.0 0.0  Index 28.3 22.0 0.0 0.0 0.0 0.0 0.0   Threshold: 3% <100% <90% <80% <70% <60% <50% <40%  Supine 47.0 43.0 0.0 0.0 0.0 0.0 0.0  Side 33.0 23.0 0.0 0.0 0.0 0.0 0.0  Prone 0.0 0.0 0.0 0.0 0.0 0.0 0.0  Upright 0.0 0.0 0.0 0.0 0.0 0.0 0.0  Total 80.0 66.0 0.0 0.0 0.0 0.0 0.0  Index 14.4 11.9 0.0 0.0 0.0 0.0 0.0   Threshold: 4% <100% <90% <80% <70% <60% <50% <40%  Supine 25.0 23.0 0.0 0.0 0.0 0.0 0.0  Side 24.0 18.0 0.0 0.0 0.0 0.0 0.0  Prone 0.0 0.0 0.0 0.0 0.0 0.0 0.0  Upright 0.0 0.0 0.0 0.0 0.0 0.0 0.0  Total 49.0 41.0 0.0 0.0 0.0 0.0 0.0  Index 8.8 7.4 0.0 0.0 0.0 0.0 0.0   Threshold: 3% <100% <90% <80% <70% <60% <50% <40%  Supine 47 43 0 0 0 0 0  Side 33 23 0 0 0 0 0  Prone 0 0 0 0 0 0 0  Upright 0 0 0 0 0 0 0  Total 80 66 0 0 0 0 0   Awakening/Arousal Information # of Awakenings 40  Wake after sleep onset 68.34m  Wake after persistent sleep 61.99m   Arousal Assoc. Arousals Index  Apneas 2 0.4  Hypopneas 6 1.3  Leg Movements 9 1.9  Snore 0 0.0  PTT Arousals 0 0.0  Spontaneous 109 23.1  Total 127 26.9  Leg Movement Information PLMS LMs Index  Total LMs during PLMS 0 0.0  LMs w/ Microarousals 0 0.0   LM LMs Index  w/ Microarousal 9 1.9  w/ Awakening 3 0.6  w/ Resp Event 1 0.2  Spontaneous 10 2.1  Total 19 4.0     Desaturation threshold setting: 3% Minimum desaturation setting: 10 seconds SaO2 nadir: 60% The longest event was a 25 sec obstructive Hypopnea with a minimum SaO2 of 86%. The lowest  SaO2 was 83% associated with a 12 sec obstructive Hypopnea. EKG Rates EKG Avg Max Min  Awake 76 92 63  Asleep 73 99 59  EKG Events: Tachycardia

## 2022-08-29 ENCOUNTER — Telehealth: Payer: Self-pay

## 2022-08-29 NOTE — Telephone Encounter (Signed)
-----   Message from Huston Foley, MD sent at 08/28/2022  6:00 PM EDT ----- Patient referred by Dr. Terrace Arabia, seen by me on 03/02/22, patient had a diagnostic PSG on 08/22/22.   Please call and notify the patient that the recent sleep study did not show any significant sleep apnea. Stephanie Chase did not sleep very well, achieved mostly light stage sleep, no REM/dream sleep. Her oxygen saturations dropped, even in the absence of significant sleep apnea and Stephanie Chase had mild snoring. I recommend Stephanie Chase FU with PCP regarding her O2 drops during sleep. I recommend complete smoking cessation and optimizing her COPD management (with PCP or lung specialist, if needed).  Treatment with a positive airway pressure device, such as CPAP or autoPAP is not indicated. Avoidance of the supine sleep position and some weight loss may reduce snoring. At this juncture, Stephanie Chase can follow up with her PCP and other providers including Dr. Terrace Arabia as scheduled/planned.   Thanks,  Huston Foley, MD, PhD [facility])

## 2022-08-29 NOTE — Telephone Encounter (Signed)
I spoke with the patient and informed her of the sleep study results. She was advised to follow up with PCP regarding oxygen drops during sleep. Recommendations regarding sleep position and weight loss were relayed. She verbalized understanding of the findings and expressed appreciation for the call. All questions answered.

## 2022-09-02 ENCOUNTER — Other Ambulatory Visit (HOSPITAL_COMMUNITY): Payer: Self-pay

## 2022-09-02 ENCOUNTER — Telehealth: Payer: Self-pay

## 2022-09-02 ENCOUNTER — Other Ambulatory Visit: Payer: Self-pay | Admitting: Hematology & Oncology

## 2022-09-02 ENCOUNTER — Encounter: Payer: Self-pay | Admitting: Hematology & Oncology

## 2022-09-02 NOTE — Telephone Encounter (Signed)
Pharmacy Patient Advocate Encounter   Received notification from that prior authorization for Synthroid is required/requested.   PA submitted to  [employer]  via fax at [phone]  Status is pending

## 2022-09-04 ENCOUNTER — Other Ambulatory Visit (HOSPITAL_COMMUNITY): Payer: Self-pay

## 2022-09-04 ENCOUNTER — Encounter: Payer: Self-pay | Admitting: Hematology & Oncology

## 2022-09-04 NOTE — Telephone Encounter (Signed)
Spoke to patient and stated that she has been on brand for a long time, but since insurance will not cover it, she has been taking the generic and paying out of pocket. She stated that she has no idea what she would have to do to get them to cover it, they haven't covered it in years.

## 2022-09-04 NOTE — Telephone Encounter (Signed)
Pharmacy Patient Advocate Encounter  Received notification from [employer] that the request for prior authorization for Synthorid has been denied due to the prosposed prescription drug does not qualify as a covered service under the prescription benefits of [employer].    Please be advised we currently do not have a Pharmacist to review denials, therefore you will need to process appeals accordingly as needed. Thanks for your support at this time.   You may call [phone] to appeal.   Denial letter indexed to chart.

## 2022-09-04 NOTE — Telephone Encounter (Signed)
Please see message below

## 2022-09-05 NOTE — Telephone Encounter (Signed)
Left message to return call to office.

## 2022-09-06 ENCOUNTER — Encounter: Payer: Self-pay | Admitting: *Deleted

## 2022-09-06 NOTE — Telephone Encounter (Signed)
Tried calling patient, lvm. Sent patient mychart message with message below.

## 2022-10-25 ENCOUNTER — Encounter (INDEPENDENT_AMBULATORY_CARE_PROVIDER_SITE_OTHER): Payer: Self-pay

## 2022-11-03 ENCOUNTER — Ambulatory Visit: Payer: BC Managed Care – PPO | Admitting: Family Medicine

## 2022-11-08 ENCOUNTER — Ambulatory Visit: Payer: BC Managed Care – PPO

## 2022-11-08 ENCOUNTER — Encounter: Payer: Self-pay | Admitting: Psychology

## 2022-11-08 ENCOUNTER — Ambulatory Visit (INDEPENDENT_AMBULATORY_CARE_PROVIDER_SITE_OTHER): Payer: BC Managed Care – PPO | Admitting: Psychology

## 2022-11-08 DIAGNOSIS — F09 Unspecified mental disorder due to known physiological condition: Secondary | ICD-10-CM

## 2022-11-08 DIAGNOSIS — F332 Major depressive disorder, recurrent severe without psychotic features: Secondary | ICD-10-CM

## 2022-11-08 DIAGNOSIS — F411 Generalized anxiety disorder: Secondary | ICD-10-CM | POA: Diagnosis not present

## 2022-11-08 DIAGNOSIS — G629 Polyneuropathy, unspecified: Secondary | ICD-10-CM | POA: Insufficient documentation

## 2022-11-08 DIAGNOSIS — R4189 Other symptoms and signs involving cognitive functions and awareness: Secondary | ICD-10-CM

## 2022-11-08 DIAGNOSIS — M329 Systemic lupus erythematosus, unspecified: Secondary | ICD-10-CM

## 2022-11-08 NOTE — Progress Notes (Signed)
NEUROPSYCHOLOGICAL EVALUATION [place] Department of Neurology  Date of Evaluation: November 08, 2022  Reason for Referral:   Stephanie Chase is a 62 y.o. right-handed Caucasian female referred by Shon Millet, D.O., to characterize her current cognitive functioning and assist with diagnostic clarity and treatment planning in the context of subjective cognitive decline and numerous medical and psychiatric comorbidities.   Assessment and Plan:   Clinical Impression(s): Stephanie Chase pattern of performance is suggestive of a relative weakness across verbal fluency (both phonemic and semantic) and further performance variability across both processing speed and executive functioning. While she exhibited some weakness learning a novel list of words, she recalled this information well after a delay (retention rate of 125%) and performed well across three other memory tasks, thus not suggestive of any consistent memory impairments. Performances were appropriate relative to age-matched peers across attention/concentration, confrontation naming, and visuospatial abilities. Stephanie Chase denied difficulties completing instrumental activities of daily living (ADLs) independently.   Across mood-related questionnaires, her responses suggested acute levels of mild anxiety and severe depression. While she invalidated a more comprehensive personality measure due to inconsistent responding, subscale elevations also would suggest primary difficulties surrounding somatic (i.e., physical) symptoms and severe depression if taken at face value. Cognitive dysfunction surrounding processing speed, executive functioning, verbal fluency, and encoding (i.e., learning) aspects of memory are quite common in individuals with severe psychiatric distress. These difficulties are also common in individuals diagnosed with lupus and who experience chronic pain, frequent headaches, and ongoing sleep  dysfunction. The combination of these factors represents the most likely culprit for objective weaknesses across testing and subjective impairments in Stephanie Chase's day-to-day life.   Specific to memory, Stephanie Chase was generally able to learn novel verbal and visual information efficiently. She consistently retained this knowledge after lengthy delays. Overall, memory performance combined with intact performances across other areas of cognitive functioning is not suggestive of currently symptomatic Alzheimer's disease. Likewise, her cognitive and behavioral profile is not suggestive of any other form of neurodegenerative illness presently.  Recommendations: Should she describe prominent and progressive cognitive decline in the future, a repeat evaluation could be considered at that time. The current evaluation will serve as an excellent baseline for future comparisons.   A combination of medication and psychotherapy has been shown to be most effective at treating symptoms of anxiety and depression. As such, Stephanie Chase is encouraged to speak with her prescribing physician regarding medication adjustments to optimally manage these symptoms. She could also consider being seen by a psychiatrist. She could contact some of these resources to see if they are accepting new patients and accept her insurance:  Dr. Ardeth Sportsman - [phone] [facility]) - [phone] [facility] [place]) - [phone] Dr. Milagros Evener [facility]) [phone] [facility] [place]) [phone] [facility] [place]) - [phone] [facility] [place]) [phone] [facility], [address], [place], [place] [phone] Dr. Dionisio David (neuropsychiatry); Franchot Erichsen; [facility]; 9th Floor; [place], [place] [phone] Dr. Neysa Hotter; [facility]; [address]; [place], [place] [phone]  Stephanie Chase did report being involved in near weekly individual psychotherapy. However, she described this process as unhelpful. When asked, she was unable to identify any current therapeutic goals or what she and her therapist had most recently been working on. She stated that she did not "feel like I have resolved anything" and "we just go in  and talk and don't deal with my past." I would strongly recommend that Stephanie Chase discuss these concerns with her therapist directly as purely supportive therapy will in all likelihood be insufficient in her case. She would benefit from an active and collaborative therapeutic environment, rather than one purely supportive in nature. Recommended treatment modalities include Cognitive Behavioral Therapy (CBT) or Acceptance and Commitment Therapy (ACT). If her current therapist is unable to accommodate this modality switch, I would encourage Stephanie Chase to establish care with a different clinician.   Stephanie Chase is encouraged to attend to lifestyle factors for brain health (e.g., regular physical exercise, good nutrition habits and consideration of the MIND-DASH diet, regular participation in cognitively-stimulating activities, and general stress management techniques), which are likely to have benefits for both emotional adjustment and cognition. In fact, in addition to promoting good general health, regular exercise incorporating aerobic activities (e.g., brisk walking, jogging, cycling, etc.) has been demonstrated to be a very effective treatment for depression and stress, with similar efficacy rates to both antidepressant medication and psychotherapy. Optimal control of vascular risk factors (including safe cardiovascular exercise and adherence to dietary recommendations) is encouraged. Continued participation in activities which provide mental stimulation and social interaction is  also recommended.   If interested, there are some activities which have therapeutic value and can be useful in keeping her cognitively stimulated. For suggestions, Stephanie Chase is encouraged to go to the following website: https://www.[facility]row[facility].org/get-to-know-barrow/centers-programs/neurorehabilitation-center/neuro-rehab-apps-and-games/ which has options, categorized by level of difficulty. It should be noted that these activities should not be viewed as a substitute for therapy.  Memory can be improved using internal strategies such as rehearsal, repetition, chunking, mnemonics, association, and imagery. External strategies such as written notes in a consistently used memory journal, visual and nonverbal auditory cues such as a calendar on the refrigerator or appointments with alarm, such as on a cell phone, can also help maximize recall.    When learning new information, she would benefit from information being broken up into small, manageable pieces. she may also find it helpful to articulate the material in her own words and in a context to promote encoding at the onset of a new task. This material may need to be repeated multiple times to promote encoding.  Reducing anxiety may also aid in the retrieval of information. She is encouraged to prepare scripts she can use socially when she experiences difficulty with word finding or memory. Such scripts should be brief explanations of the difficulty (e.g., "the word escapes me now") and allow her to move the conversation forward quickly rather than dwelling on the issue.  To address problems with fluctuating attention and/or executive dysfunction, she may wish to consider:   -Avoiding external distractions when needing to concentrate   -Limiting exposure to fast paced environments with multiple sensory demands   -Writing down complicated information and using checklists   -Attempting and completing one task at a time (i.e., no multi-tasking)    -Verbalizing aloud each step of a task to maintain focus   -Taking frequent breaks during the completion of steps/tasks to avoid fatigue   -Reducing the amount of information considered at one time   -Scheduling more difficult activities for a time of day where she is usually most alert  Review of Records:   Stephanie Chase was seen by [facility] Shon Millet, D.O.) on 02/01/2022 for an evaluation of headaches and memory concerns. Specific to the former, she described daily pressure/throbbing headaches, generally located in the occipital lobe and  top of head. She also described tingling sensations on her scalp. There is no associated nausea, vomiting, photophobia, phonophobia, or visual disturbance. Symptoms can last 1-3 hours. Specific to memory concerns, she described generalized forgetfulness and short-term memory concerns. She provided a recent example of getting disoriented while driving on a familiar route. She also endorsed brain fog. Ultimately, Stephanie Chase was referred for a comprehensive neuropsychological evaluation to characterize her cognitive abilities and to assist with diagnostic clarity and treatment planning.   Neuroimaging Brain MRI on 05/20/2020 was unremarkable. Brain MRI on 12/15/2021 was also unremarkable outside of partially empty sella turcica which can be associated with idiopathic intracranial hypertension (pseudotumor cerebri).   Past Medical History:  Diagnosis Date   Allergy Seasonal   Anemia    Asthma    Body aches 05/30/2021   Cervical paraspinous muscle spasm    Chronic left-sided low back pain with left-sided sciatica 06/02/2020   COPD (chronic obstructive pulmonary disease)    emphysema   Difficulty hearing 02/13/2012   Excessive sleepiness 01/18/2022   Family history of bladder cancer    Family history of BRCA gene mutation    Family history of breast cancer    Family history of prostate cancer    Generalized anxiety disorder    Generalized  osteoarthritis 02/23/2012   Genetic testing 08/12/2019   Negative genetic testing:  No pathogenic variants detected on the [facility] Breast Cancer STAT panel + Common Hereditary Cancers panel. A variant of uncertain significance (VUS) was detected in the ATM gene called [id number]. The report date is 08/10/2019.     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   right, 05/13/20 completed Taxol, on Herceptin   Neck pain on left side 06/02/2020   Neuropathy    Occupational bronchitis 03/03/2015   Osteoporosis    Pain in right shoulder 03/09/2021   Paresthesia 05/13/2020   Plantar fasciitis, bilateral 02/23/2012   Pneumonia    Polyarthralgia 03/09/2021   Pulmonary emphysema 04/15/2020   Pure hypercholesterolemia 04/15/2020   Rosacea 12/15/2021   Systemic lupus erythematosus 06/14/2010   Tobacco use 04/15/2020   Vitamin D deficiency 10/11/2010   Vitiligo 02/23/2012    Past Surgical History:  Procedure Laterality Date   ABLATION ON ENDOMETRIOSIS  2016   APPLICATION OF A-CELL OF EXTREMITY Right 10/22/2019   Procedure: EXCISION OF RIGHT BREAST WOUND WITH PRIMARY CLOSURE;  Surgeon: Peggye Form, DO;  Location: [facility] OR;  Service: Plastics;  Laterality: Right;   BREAST LUMPECTOMY     DEBRIDEMENT AND CLOSURE WOUND Right 10/22/2019   Procedure: Excision of right breast wound;  Surgeon: Peggye Form, DO;  Location: [facility] OR;  Service: Plastics;  Laterality: Right;   MASTECTOMY W/ SENTINEL NODE BIOPSY Bilateral 09/05/2019   Procedure: BILATERAL MASTECTOMY WITH RIGHT SENTINEL LYMPH NODE BIOPSY;   Surgeon: Almond Lint, MD;  Location: [facility] OR;  Service: General;  Laterality: Bilateral;  COMBINED WITH REGIONAL FOR POST OP PAIN   PORTACATH PLACEMENT Left 09/05/2019   Procedure: INSERTION PORT-A-CATH WITH ULTRASOUND GUIDANCE;  Surgeon: Almond Lint, MD;  Location: [facility] OR;  Service: General;  Laterality: Left;   ROTATOR CUFF  REPAIR Right 06/09/2021   TONSILLECTOMY     TUBAL LIGATION     WISDOM TOOTH EXTRACTION      Current Outpatient Medications:    acyclovir ointment (ZOVIRAX) 5 %, Apply 1 Application topically every 3 (three) hours., Disp: 30 g, Rfl: 0   acetaminophen (TYLENOL) 500 MG tablet, Take 500-1,000 mg by mouth every 6 (six) hours as needed for moderate pain. , Disp: , Rfl:    albuterol (PROAIR HFA) 108 (90 Base) MCG/ACT inhaler, Inhale 2 puffs into the lungs every 6 (six) hours as needed for wheezing or shortness of breath., Disp: 1 each, Rfl: 5   cetirizine (ZYRTEC) 10 MG tablet, Take by mouth., Disp: , Rfl:    DULoxetine (CYMBALTA) 60 MG capsule, TAKE 1 CAPSULE (60 MG TOTAL) BY MOUTH DAILY. FILL CYMBALTA 30MG  DAILY FIRST, Disp: 90 capsule, Rfl: 3   fluticasone (FLONASE) 50 MCG/ACT nasal spray, Place 2 sprays into both nostrils in the morning and at bedtime., Disp: 16 g, Rfl: 5   gabapentin (NEURONTIN) 100 MG capsule, Take 2 capsules (200 mg total) by mouth 2 (two) times daily. Increase in 5-7 days to 2 and then to 3 if tolerate, Disp: 360 capsule, Rfl: 1   ipratropium (ATROVENT) 0.06 % nasal spray, Place 2 sprays into both nostrils 4 (four) times daily. As needed for nasal congestion, runny nose, Disp: 15 mL, Rfl: 0   levothyroxine (SYNTHROID) 75 MCG tablet, Take 1 tablet (75 mcg total) by mouth daily before breakfast., Disp: 90 tablet, Rfl: 3   liothyronine (CYTOMEL) 5 MCG tablet, TAKE 2 TABLETS (=10MCG     TOTAL)     DAILY, Disp: 180 tablet, Rfl: 1   LORazepam (ATIVAN) 0.5 MG tablet, PLEASE SEE ATTACHED FOR DETAILED DIRECTIONS, Disp: , Rfl:    meloxicam (MOBIC) 15 MG tablet, TAKE  1 TABLET (15 MG TOTAL) BY MOUTH AS DIRECTED., Disp: 30 tablet, Rfl: 1   traZODone (DESYREL) 50 MG tablet, TAKE 1 TABLET BY MOUTH EVERYDAY AT BEDTIME, Disp: 90 tablet, Rfl: 2   Vitamin D, Ergocalciferol, (DRISDOL) 1.25 MG (50000 UNIT) CAPS capsule, TAKE 1 CAPSULE BY MOUTH ONE TIME PER WEEK, Disp: 12 capsule, Rfl: 4  Clinical Interview:   The following information was obtained during a clinical interview with Stephanie Chase prior to cognitive testing.  Cognitive Symptoms: Decreased short-term memory: Endorsed. She provided a recent example of getting disoriented while driving on a familiar route to the point "I did not know where I was." She also described trouble with information recall on demand, as well as instances where she might enter a room or open the refrigerator and forget her original intention. She also noted attempting a new job after completing chemotherapy but was unable to be successful due to her frequently getting locked out of her computer and not recalling how to complete various job tasks. Difficulties were said to be present since undergoing chemotherapy in 2021 and have remained largely consistent since that time. Chemotherapy was completed in 2021-2022.  Decreased long-term memory: Denied. Decreased attention/concentration: Endorsed. She reported trouble with sustained attention and increased distractibility, noting "I get sidetracked all of the time." Reduced processing speed: Endorsed. She reported ongoing brain fog experiences since completing chemotherapy. Difficulties with executive functions: Endorsed. She described prominent difficulties with multi-tasking and organization. She denied trouble with impulsivity or any significant personality changes.  Difficulties with emotion regulation: Denied. Difficulties with receptive language: Denied. Difficulties with word finding: Endorsed. Decreased visuoperceptual ability: Denied.  Difficulties completing ADLs:  Denied.  Additional Medical History: History of traumatic brain injury/concussion: Denied. History of stroke: Denied. History of seizure activity: Denied. History of known exposure to toxins: Denied. Symptoms of chronic pain: Endorsed. She described diffuse pain "everywhere" and carries a prior diagnosis of systemic lupus erythematosus (SLE). Pain was described as being at least noticeable on a daily basis and likely interfering with day-to-day functioning.  Experience of frequent headaches/migraines: Endorsed (see above). She continued to report daily headache experiences.  Frequent instances of dizziness/vertigo: Unclear. She reported experiences where she feels "spacey" but was unable to further elaborate on what she meant by this comment.   Sensory changes: She wears glasses with benefit. Medical records suggest ongoing hearing loss, not to the extent that hearing aids have been prescribed. Other sensory changes/difficulties (e.g., taste or smell) were not reported.  Balance/coordination difficulties: Denied. She also denied any recent falls.  Other motor difficulties: Denied.  Sleep History: Estimated hours obtained each night: Unclear. She reported likely laying in bed for around 7 hours nightly. However, she also noted that she may wake anywhere from 1-6 times throughout the night, creating very broken sleep patterns.  Difficulties falling asleep: Denied. Difficulties staying asleep: Endorsed. She completed a sleep study within the past six months. This suggested that she generally did not spend time in deeper REM/stage 4 sleep stages, likely impacted by her waking frequently throughout the night. It also revealed low oxygen saturation levels while asleep.  Feels rested and refreshed upon awakening: Denied. She reported waking feeling exhausted.   History of snoring: Endorsed. History of waking up gasping for air: Denied. Witnessed breath cessation while asleep: Denied. Her recent sleep  study did not suggest underlying obstructive sleep apnea.   History of vivid dreaming: Denied. Excessive movement while asleep: Denied. Instances of acting out her dreams: Denied.  Psychiatric/Behavioral Health History: Depression: She described her current mood as "depressed," describing symptoms being present largely since her diagnosis of breast cancer and her undergoing chemotherapy in 2021. She was unsure if current medications were helpful in improving her mood. She is involved in near weekly individual psychotherapy but described this as a thus far wholly unhelpful experience. Current or remote suicidal ideation, intent, or plan was denied.  Anxiety: She reported a longstanding history of generalized anxious distress, dating back approximately 20 years following the passing of her father who had Alzheimer's disease. She does hold specific anxiety surrounding her potentially developing this illness as she ages. She further noted the exacerbation of generalized anxious distress since 2021 and her perception of diminished cognitive and functional abilities. She was unsure if current medications were providing any benefit.  Mania: Denied. Trauma History: Denied. Visual/auditory hallucinations: Denied. Delusional thoughts: Denied.  Tobacco: Endorsed. She reported consuming one pack of cigarettes daily.  Alcohol: She denied current alcohol consumption as well as a history of problematic alcohol abuse or dependence.  Recreational drugs: Denied.  Family History: Problem Relation Age of Onset   Breast cancer Mother 33   Diabetes Mother    Bladder Cancer Mother 91       ureteral cancer   Cancer Mother        blood cancer   Asthma Mother    Depression Mother    Hearing loss Mother    Kidney disease Mother    Vision loss Mother    Dementia Father    Heart attack Father    Hyperlipidemia Father    Hypertension Father    Alzheimer's disease Father    Prostate cancer Father  dx.  >50   Cancer Father    COPD Father    Hearing loss Father    Heart disease Father    Cancer Maternal Aunt    Cancer Maternal Uncle        prostate or colon   Cancer Paternal Uncle    BRCA 1/2 Daughter    Cancer Cousin        unknown type; dx. in her 36s (maternal cousin)   Alzheimer's disease Other    Breast cancer Other 45       maternal great-aunt   Sleep apnea Neg Hx    This information was confirmed by Ms. Hallmon.  Academic/Vocational History: Highest level of educational attainment: 12 years. She eventually graduated from high school but did report formally leaving school on two separate occasions. During the first time, she described being a B/C student and left due to her moving around a lot, joining that school mid-year, and not liking the school itself. During the second time, she described herself as an A/B student and left after getting married young and running into financial complications. No relative weaknesses were identified across academic subjects.  History of developmental delay: Denied. History of grade repetition: Denied. Enrollment in special education courses: Denied. History of LD/ADHD: Denied.  Employment: Unemployed/Disability. Prior to her receiving disability benefits, she reported working in the [employer].   Evaluation Results:   Behavioral Observations: Stephanie Chase was unaccompanied, arrived to her appointment on time, and was appropriately dressed and groomed. She appeared alert and oriented. Observed gait and station were within normal limits. Gross motor functioning appeared intact upon informal observation and no abnormal movements (e.g., tremors) were noted. Her affect was flat and appeared depressed at times throughout the interview. Spontaneous speech was fluent. However, it was common for her to exhibit a noted response latency as if she was having trouble finding her words and organizing her thoughts. Prominent word finding difficulties  were not observed outside of these experiences. Thought processes were coherent, organized, and normal in content. Insight into her cognitive difficulties appeared adequate.   During testing, sustained attention was appropriate. Task engagement was adequate and she persisted when challenged. Overall, Stephanie Chase was cooperative with the clinical interview and subsequent testing procedures.   Adequacy of Effort: The validity of neuropsychological testing is limited by the extent to which the individual being tested may be assumed to have exerted adequate effort during testing. Stephanie Chase expressed her intention to perform to the best of her abilities and exhibited adequate task engagement and persistence. Scores across stand-alone and embedded performance validity measures were within expectation. As such, the results of the current evaluation are believed to be a valid representation of Stephanie Chase's current cognitive functioning.  Test Results: Ms. Vaughns was mildly disoriented at the time of the current evaluation. She incorrectly stated her birthdate ("12/07/1962"). She was also one day off when stating the current date and was unable to name the current clinic.   Intellectual abilities based upon educational and vocational attainment were estimated to be in the below average to average range. Premorbid abilities were estimated to be within the below average range based upon a single-word reading test.   Processing speed was variable, ranging from the exceptionally low to average normative ranges. Basic attention was above average. More complex attention (e.g., working memory) was average. Executive functioning was variable, ranging from the exceptionally low to average normative ranges.  While not directly assessed, receptive language abilities were believed to  be intact. Likewise, Ms. Kraemer did not exhibit any difficulties comprehending task instructions and answered all questions asked of her  appropriately. Assessed expressive language was mildly variable. Verbal fluency was well below average to below average while confrontation naming was average.     Assessed visuospatial/visuoconstructional abilities were average to well above average.    Learning (i.e., encoding) of novel verbal and visual information was well below average to average. Spontaneous delayed recall (i.e., retrieval) of previously learned information was below average to average. Retention rates were 93% across a story learning task, 125% across a list learning task, and 100% across a shape learning task. Performance across recognition tasks was below average to average, suggesting evidence for information consolidation.   Results of emotional screening instruments suggested that recent symptoms of generalized anxiety were in the mild range, while symptoms of depression were within the severe range. She invalidated a more comprehensive personality questionnaire due to inconsistent responding. A screening instrument assessing recent sleep quality suggested the presence of moderate sleep dysfunction.  Tables of Scores:   Note: This summary of test scores accompanies the interpretive report and should not be considered in isolation without reference to the appropriate sections in the text. Descriptors are based on appropriate normative data and may be adjusted based on clinical judgment. Terms such as "Within Normal Limits" and "Outside Normal Limits" are used when a more specific description of the test score cannot be determined.       Percentile - Normative Descriptor > 98 - Exceptionally High 91-97 - Well Above Average 75-90 - Above Average 25-74 - Average 9-24 - Below Average 2-8 - Well Below Average < 2 - Exceptionally Low       Validity:   DESCRIPTOR       ACS WC: --- --- Within Normal Limits  DCT: --- --- Within Normal Limits  NAB EVI: --- --- Within Normal Limits       Orientation:      Raw Score  Percentile   NAB Orientation, Form 1 25/29 --- ---       Cognitive Screening:      Raw Score Percentile   SLUMS: 25/30 --- ---       Intellectual Functioning:      Standard Score Percentile   Test of Premorbid Functioning: 88 21 Below Average       Memory:     NAB Memory Module, Form 1: Standard Score/ T Score Percentile   Total Memory Index 83 13 Below Average  List Learning       Total Trials 1-3 15/36 (35) 7 Well Below Average    List B 3/12 (42) 21 Below Average    Short Delay Free Recall 4/12 (36) 8 Well Below Average    Long Delay Free Recall 5/12 (42) 21 Below Average    Retention Percentage 125 (61) 86 Above Average    Recognition Discriminability 5 (41) 18 Below Average  Shape Learning       Total Trials 1-3 14/27 (47) 38 Average    Delayed Recall 6/9 (53) 62 Average    Retention Percentage 100 (51) 54 Average    Recognition Discriminability 8 (56) 73 Average  Story Learning       Immediate Recall 53/80 (42) 21 Below Average    Delayed Recall 28/40 (42) 21 Below Average    Retention Percentage 93 (51) 54 Average  Daily Living Memory       Immediate Recall 38/51 (43) 25 Average    Delayed  Recall 11/17 (39) 14 Below Average    Retention Percentage 73 (39) 14 Below Average    Recognition Hits 8/10 (42) 21 Below Average       Attention/Executive Function:     Trail Making Test (TMT): Raw Score (T Score) Percentile     Part A 31 secs.,  1 error (51) 54 Average    Part B 136 secs.,  5 errors (36) 8 Well Below Average        NAB Attention Module, Form 1: T Score Percentile     Digits Forward 58 79 Above Average    Digits Backwards 51 54 Average       D-KEFS Color-Word Interference Test: Raw Score (Scaled Score) Percentile     Color Naming 51 secs. (2) <1 Exceptionally Low    Word Reading 32 secs. (6) 9 Below Average    Inhibition 138 secs. (1) <1 Exceptionally Low      Total Errors 3 errors (9) 37 Average    Inhibition/Switching 143 secs. (1) <1 Exceptionally  Low      Total Errors 2 errors (11) 63 Average       D-KEFS Verbal Fluency Test: Raw Score (Scaled Score) Percentile     Letter Total Correct 21 (6) 9 Below Average    Category Total Correct 25 (6) 9 Below Average    Category Switching Total Correct 10 (7) 16 Below Average    Category Switching Accuracy 9 (8) 25 Average      Total Set Loss Errors 0 (13) 84 Above Average      Total Repetition Errors 0 (13) 84 Above Average       Language:     Verbal Fluency Test: Raw Score (T Score) Percentile     Phonemic Fluency (FAS) 21 (31) 3 Well Below Average    Animal Fluency 13 (34) 5 Well Below Average        NAB Language Module, Form 1: T Score Percentile     Naming 31/31 (56) 73 Average       Visuospatial/Visuoconstruction:      Raw Score Percentile   Clock Drawing: 10/10 --- Within Normal Limits       NAB Spatial Module, Form 1: T Score Percentile     Figure Drawing Copy 63 91 Well Above Average        Scaled Score Percentile   WAIS-IV Block Design: 9 37 Average  WAIS-IV Matrix Reasoning: 14 91 Well Above Average       Mood and Personality:      Raw Score Percentile   Beck Depression Inventory - II: 34 --- Severe  PROMIS Anxiety Questionnaire: 16 --- Mild       Personality Assessment Inventory: T Score  Percentile     Inconsistency 76 --- Invalid    Infrequency 44 --- Within Normal Limits    Negative Impression 73 --- Moderate    Positive Impression 57 --- Moderate    Somatic Complaints 74 --- Elevated    Anxiety 57 --- Within Normal Limits    Anxiety-Related Disorders 62 --- Within Normal Limits    Depression 84 --- Elevated    Mania 38 --- Within Normal Limits    Paranoia 62 --- Within Normal Limits    Schizophrenia 64 --- Within Normal Limits    Borderline Features 67 --- Within Normal Limits    Antisocial Features 48 --- Within Normal Limits    Alcohol Problems 41 --- Within Normal Limits    Drug Problems 60 ---  Within Normal Limits    Aggression 35 --- Within  Normal Limits    Suicidal Ideation 60 --- Within Normal Limits    Stress 53 --- Within Normal Limits    Non Support 64 --- Within Normal Limits    Treatment Rejection 46 --- Within Normal Limits    Dominance 29 --- Within Normal Limits    Warmth 40 --- Within Normal Limits       Additional Questionnaires:      Raw Score Percentile   PROMIS Sleep Disturbance Questionnaire: 36 --- Moderate   Informed Consent and Coding/Compliance:   The current evaluation represents a clinical evaluation for the purposes previously outlined by the referral source and is in no way reflective of a forensic evaluation.   Ms. Yance was provided with a verbal description of the nature and purpose of the present neuropsychological evaluation. Also reviewed were the foreseeable risks and/or discomforts and benefits of the procedure, limits of confidentiality, and mandatory reporting requirements of this provider. The patient was given the opportunity to ask questions and receive answers about the evaluation. Oral consent to participate was provided by the patient.   This evaluation was conducted by Newman Nickels, Ph.D., ABPP-CN, board certified clinical neuropsychologist. Ms. Radich completed a clinical interview with Dr. Milbert Coulter, billed as one unit [phone], and 170 minutes of cognitive testing and scoring, billed as one unit [phone] and five additional units [id number]. Psychometrist Shan Levans, B.S. assisted Dr. Milbert Coulter with test administration and scoring procedures. As a separate and discrete service, one unit [id number] and two units [phone] were billed for Dr. Tammy Sours time spent in interpretation and report writing.

## 2022-11-08 NOTE — Progress Notes (Signed)
   Psychometrician Note   Cognitive testing was administered to Stephanie Chase by Stephanie Chase, B.S. (psychometrist) under the supervision of Dr. Newman Nickels, Ph.D., licensed psychologist on 11/08/2022. Stephanie Chase did not appear overtly distressed by the testing session per behavioral observation or responses across self-report questionnaires. Rest breaks were offered.    The battery of tests administered was selected by Dr. Newman Nickels, Ph.D. with consideration to Stephanie Chase's current level of functioning, the nature of her symptoms, emotional and behavioral responses during interview, level of literacy, observed level of motivation/effort, and the nature of the referral question. This battery was communicated to the psychometrist. Communication between Dr. Newman Nickels, Ph.D. and the psychometrist was ongoing throughout the evaluation and Dr. Newman Nickels, Ph.D. was immediately accessible at all times. Dr. Newman Nickels, Ph.D. provided supervision to the psychometrist on the date of this service to the extent necessary to assure the quality of all services provided.    Stephanie Chase will return within approximately 1-2 weeks for an interactive feedback session with Dr. Milbert Coulter at which time her test performances, clinical impressions, and treatment recommendations will be reviewed in detail. Stephanie Chase understands she can contact our office should she require our assistance before this time.  A total of 170 minutes of billable time were spent face-to-face with Stephanie Chase by the psychometrist. This includes both test administration and scoring time. Billing for these services is reflected in the clinical report generated by Dr. Newman Nickels, Ph.D.  This note reflects time spent with the psychometrician and does not include test scores or any clinical interpretations made by Dr. Milbert Coulter. The full report will follow in a separate note.

## 2022-11-10 ENCOUNTER — Inpatient Hospital Stay: Payer: BC Managed Care – PPO | Attending: Hematology

## 2022-11-10 ENCOUNTER — Inpatient Hospital Stay: Payer: BC Managed Care – PPO

## 2022-11-10 ENCOUNTER — Ambulatory Visit: Payer: BC Managed Care – PPO | Admitting: Family Medicine

## 2022-11-10 ENCOUNTER — Inpatient Hospital Stay (HOSPITAL_BASED_OUTPATIENT_CLINIC_OR_DEPARTMENT_OTHER): Payer: BC Managed Care – PPO | Admitting: Hematology & Oncology

## 2022-11-10 ENCOUNTER — Encounter: Payer: Self-pay | Admitting: Hematology & Oncology

## 2022-11-10 ENCOUNTER — Other Ambulatory Visit: Payer: Self-pay

## 2022-11-10 VITALS — BP 129/77 | HR 80 | Resp 18

## 2022-11-10 VITALS — BP 132/73 | HR 92 | Temp 97.7°F | Resp 18 | Ht 65.0 in | Wt 171.0 lb

## 2022-11-10 DIAGNOSIS — Z170 Estrogen receptor positive status [ER+]: Secondary | ICD-10-CM

## 2022-11-10 DIAGNOSIS — C50411 Malignant neoplasm of upper-outer quadrant of right female breast: Secondary | ICD-10-CM | POA: Diagnosis not present

## 2022-11-10 DIAGNOSIS — R300 Dysuria: Secondary | ICD-10-CM | POA: Diagnosis not present

## 2022-11-10 DIAGNOSIS — Z853 Personal history of malignant neoplasm of breast: Secondary | ICD-10-CM | POA: Diagnosis present

## 2022-11-10 DIAGNOSIS — M818 Other osteoporosis without current pathological fracture: Secondary | ICD-10-CM

## 2022-11-10 DIAGNOSIS — N390 Urinary tract infection, site not specified: Secondary | ICD-10-CM | POA: Diagnosis present

## 2022-11-10 DIAGNOSIS — M810 Age-related osteoporosis without current pathological fracture: Secondary | ICD-10-CM | POA: Diagnosis not present

## 2022-11-10 DIAGNOSIS — Z79899 Other long term (current) drug therapy: Secondary | ICD-10-CM | POA: Diagnosis not present

## 2022-11-10 LAB — CMP (CANCER CENTER ONLY)
ALT: 14 U/L (ref 0–44)
AST: 19 U/L (ref 15–41)
Albumin: 4.6 g/dL (ref 3.5–5.0)
Alkaline Phosphatase: 69 U/L (ref 38–126)
Anion gap: 7 (ref 5–15)
BUN: 10 mg/dL (ref 6–20)
CO2: 28 mmol/L (ref 22–32)
Calcium: 11 mg/dL — ABNORMAL HIGH (ref 8.9–10.3)
Chloride: 101 mmol/L (ref 98–111)
Creatinine: 0.77 mg/dL (ref 0.44–1.00)
GFR, Estimated: >60 mL/min (ref >60)
Glucose, Bld: 132 mg/dL — ABNORMAL HIGH (ref 70–99)
Potassium: 4.4 mmol/L (ref 3.5–5.1)
Sodium: 136 mmol/L (ref 135–145)
Total Bilirubin: 0.5 mg/dL (ref 0.3–1.2)
Total Protein: 7.9 g/dL (ref 6.5–8.1)

## 2022-11-10 LAB — CBC WITH DIFFERENTIAL (CANCER CENTER ONLY)
Abs Immature Granulocytes: 0.02 10*3/uL (ref 0.00–0.07)
Basophils Absolute: 0 10*3/uL (ref 0.0–0.1)
Basophils Relative: 1 %
Eosinophils Absolute: 0.1 10*3/uL (ref 0.0–0.5)
Eosinophils Relative: 1 %
HCT: 47.3 % — ABNORMAL HIGH (ref 36.0–46.0)
Hemoglobin: 15.7 g/dL — ABNORMAL HIGH (ref 12.0–15.0)
Immature Granulocytes: 0 %
Lymphocytes Relative: 22 %
Lymphs Abs: 1.6 10*3/uL (ref 0.7–4.0)
MCH: 28.4 pg (ref 26.0–34.0)
MCHC: 33.2 g/dL (ref 30.0–36.0)
MCV: 85.5 fL (ref 80.0–100.0)
Monocytes Absolute: 0.5 10*3/uL (ref 0.1–1.0)
Monocytes Relative: 6 %
Neutro Abs: 5.2 10*3/uL (ref 1.7–7.7)
Neutrophils Relative %: 70 %
Platelet Count: 331 10*3/uL (ref 150–400)
RBC: 5.53 MIL/uL — ABNORMAL HIGH (ref 3.87–5.11)
RDW: 13.9 % (ref 11.5–15.5)
WBC Count: 7.4 10*3/uL (ref 4.0–10.5)
nRBC: 0 % (ref 0.0–0.2)

## 2022-11-10 LAB — URINALYSIS, COMPLETE (UACMP) WITH MICROSCOPIC
Bilirubin Urine: NEGATIVE
Glucose, UA: NEGATIVE mg/dL
Hgb urine dipstick: NEGATIVE
Ketones, ur: NEGATIVE mg/dL
Nitrite: POSITIVE — AB
Protein, ur: NEGATIVE mg/dL
Specific Gravity, Urine: 1.01 (ref 1.005–1.030)
pH: 5.5 (ref 5.0–8.0)

## 2022-11-10 MED ORDER — AMOXICILLIN-POT CLAVULANATE 875-125 MG PO TABS: 1.0000 | ORAL_TABLET | Freq: Two times a day (BID) | ORAL | 0 refills | Status: DC

## 2022-11-10 MED ORDER — SODIUM CHLORIDE 0.9 % IV SOLN: INTRAVENOUS | Status: AC

## 2022-11-10 NOTE — Patient Instructions (Signed)
Dehydration, Adult Dehydration is a condition in which there is not enough water or other fluids in the body. This happens when a person loses more fluids than they take in. Important organs cannot work right without the right amount of fluids. Any loss of fluids from the body can cause dehydration. Dehydration can be mild, worse, or very bad. It should be treated right away to keep it from getting very bad. What are the causes? Conditions that cause loss of water in the body. They include: Watery poop (diarrhea). Vomiting. Sweating a lot. Fever. Infection. Peeing (urinating) a lot. Not drinking enough fluids. Certain medicines, such as medicines that take extra fluid out of the body (diuretics). Lack of safe drinking water. Not being able to get enough water and food. What increases the risk? Having a long-term (chronic) illness that has not been treated the right way, such as: Diabetes. Heart disease. Kidney disease. Being 65 years of age or older. Having a disability. Living in a place that is high above the ground or sea (high in altitude). The thinner, drier air causes more fluid loss. Doing exercises that put stress on your body for a long time. Being active when in hot places. What are the signs or symptoms? Symptoms of dehydration depend on how bad it is. Mild or worse dehydration Thirst. Dry lips or dry mouth. Feeling dizzy or light-headed. Muscle cramps. Passing little pee or dark pee. Pee may be the color of tea. Headache. Very bad dehydration Changes in skin. Skin may: Be cold to the touch (clammy). Be blotchy or pale. Not go back to normal right after you pinch it and let it go. Little or no tears, pee, or sweat. Fast breathing. Low blood pressure. Weak pulse. Pulse that is more than 100 beats a minute when you are sitting still. Other changes, such as: Feeling very thirsty. Eyes that look hollow (sunken). Cold hands and feet. Being confused. Being very  tired (lethargic) or having trouble waking from sleep. Losing weight. Loss of consciousness. How is this treated? Treatment for this condition depends on how bad your dehydration is. Treatment should start right away. Do not wait until your condition gets very bad. Very bad dehydration is an emergency. You will need to go to a hospital. Mild or worse dehydration can be treated at home. You may be asked to: Drink more fluids. Drink an oral rehydration solution (ORS). This drink gives you the right amount of fluids, salts, and minerals (electrolytes). Very bad dehydration can be treated: With fluids through an IV tube. By correcting low levels of electrolytes in the body. By treating the problem that caused your dehydration. Follow these instructions at home: Oral rehydration solution If told by your doctor, drink an ORS: Make an ORS. Use instructions on the package. Start by drinking small amounts, about  cup (120 mL) every 5-10 minutes. Slowly drink more until you have had the amount that your doctor said to have.  Eating and drinking  Drink enough clear fluid to keep your pee pale yellow. If you were told to drink an ORS, finish the ORS first. Then, start slowly drinking other clear fluids. Drink fluids such as: Water. Do not drink only water. Doing that can make the salt (sodium) level in your body get too low. Water from ice chips you suck on. Fruit juice that you have added water to (diluted). Low-calorie sports drinks. Eat foods that have the right amounts of salts and minerals, such as bananas, oranges, potatoes,   tomatoes, or spinach. Do not drink alcohol. Avoid drinks that have caffeine or sugar. These include:: High-calorie sports drinks. Fruit juice that you did not add water to. Soda. Coffee or energy drinks. Avoid foods that are greasy or have a lot of fat or sugar. General instructions Take over-the-counter and prescription medicines only as told by your doctor. Do  not take sodium tablets. Doing that can make the salt level in your body get too high. Return to your normal activities as told by your doctor. Ask your doctor what activities are safe for you. Keep all follow-up visits. Your doctor may check and change your treatment. Contact a doctor if: You have pain in your belly (abdomen) and the pain: Gets worse. Stays in one place. You have a rash. You have a stiff neck. You get angry or annoyed more easily than normal. You are more tired or have a harder time waking than normal. You feel weak or dizzy. You feel very thirsty. Get help right away if: You have any symptoms of very bad dehydration. You vomit every time you eat or drink. Your vomiting gets worse, does not go away, or you vomit blood or green stuff. You are getting treatment, but symptoms are getting worse. You have a fever. You have a very bad headache. You have: Diarrhea that gets worse or does not go away. Blood in your poop (stool). This may cause poop to look black and tarry. No pee in 6-8 hours. Only a small amount of pee in 6-8 hours, and the pee is very dark. You have trouble breathing. These symptoms may be an emergency. Get help right away. Call 911. Do not wait to see if the symptoms will go away. Do not drive yourself to the hospital. This information is not intended to replace advice given to you by your health care provider. Make sure you discuss any questions you have with your health care provider. Document Revised: 10/10/2021 Document Reviewed: 10/10/2021 Elsevier Patient Education  2024 [employer]  

## 2022-11-10 NOTE — Progress Notes (Signed)
Hematology and Oncology Follow Up Visit  Stephanie Chase [id number] June 26, 1960 61 y.o. 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 6 months- next dose in 01/2023     Interim History:  Stephanie Chase is back for follow-up.  She follow-up today.  She says she is also improving which was done in [place].  Apparently, her daughter moved from [place] to [place].  She said that it was really hot and humid the other.  She just does not have a lot of energy right now.  She seems to be a little bit dehydrated.  She still has her issues with pain.  She is having some pain in the shoulder.  I know she has had shoulder surgery before.  She has had neck surgery before.  She is getting up to [place] this Sunday.  Her stepfather apparently was diagnosed with metastatic small cell lung cancer.  She months ago after an try to get everything organized for him.  It sounds like he is not currently take any treatment.  She has had no problems with diarrhea.  She denies any type of dysuria.  We will check a urinalysis on her.  She has had no bleeding.  There has been no rashes.  Overall, I would say that her performance status is probably ECOG 1-2.    Medications:  Current Outpatient Medications:    acetaminophen (TYLENOL) 500 MG tablet, Take 500-1,000 mg by mouth every 6 (six) hours as needed for moderate pain. , Disp: , Rfl:    acyclovir ointment (ZOVIRAX) 5 %, Apply 1 Application topically every 3 (three) hours., Disp: 30 g, Rfl: 0   albuterol (PROAIR HFA) 108 (90 Base) MCG/ACT inhaler, Inhale 2 puffs into the lungs every 6 (six) hours as needed for wheezing or shortness of  breath., Disp: 1 each, Rfl: 5   cetirizine (ZYRTEC) 10 MG tablet, Take by mouth., Disp: , Rfl:    DULoxetine (CYMBALTA) 60 MG capsule, TAKE 1 CAPSULE (60 MG TOTAL) BY MOUTH DAILY. FILL CYMBALTA 30MG  DAILY FIRST, Disp: 90 capsule, Rfl: 3   fluticasone (FLONASE) 50 MCG/ACT nasal spray, Place 2 sprays into both nostrils in the morning and at bedtime., Disp: 16 g, Rfl: 5   ipratropium (ATROVENT) 0.06 % nasal spray, Place 2 sprays into both nostrils 4 (four) times daily. As needed for nasal congestion, runny nose, Disp: 15 mL, Rfl: 0   levothyroxine (SYNTHROID) 75 MCG tablet, Take 1 tablet (75 mcg total) by mouth daily before breakfast., Disp: 90 tablet, Rfl: 3   liothyronine (CYTOMEL) 5 MCG tablet, TAKE 2 TABLETS (=10MCG     TOTAL)     DAILY, Disp: 180 tablet, Rfl: 1   LORazepam (ATIVAN) 0.5 MG tablet, PLEASE SEE ATTACHED FOR DETAILED DIRECTIONS, Disp: , Rfl:    meloxicam (MOBIC) 15 MG tablet, TAKE 1 TABLET (15 MG TOTAL) BY MOUTH AS DIRECTED., Disp: 30 tablet, Rfl: 1   Vitamin D, Ergocalciferol, (DRISDOL) 1.25 MG (50000 UNIT) CAPS capsule, TAKE 1 CAPSULE BY MOUTH ONE TIME PER WEEK, Disp: 12 capsule, Rfl: 4  Allergies:  Allergies  Allergen Reactions   Bactrim [Sulfamethoxazole-Trimethoprim] Itching and Rash    Past Medical History, Surgical history, Social history, and Family History were reviewed and  updated.  Review of Systems: Review of Systems  Constitutional:  Positive for fatigue.  HENT:   Positive for hearing loss and tinnitus.   Eyes:  Positive for eye problems.  Respiratory:  Positive for cough.   Cardiovascular: Negative.   Gastrointestinal:  Positive for abdominal pain and nausea.  Endocrine: Negative.   Genitourinary: Negative.    Musculoskeletal:  Positive for arthralgias and myalgias.  Skin: Negative.   Neurological:  Positive for light-headedness.  Hematological: Negative.   Psychiatric/Behavioral: Negative.      Physical Exam:  height is 5\' 5"  (1.651 m) and weight is  171 lb 0.6 oz (77.6 kg). Her oral temperature is 97.7 F (36.5 C). Her blood pressure is 132/73 and her pulse is 92. Her respiration is 18 and oxygen saturation is 100%.   Wt Readings from Last 3 Encounters:  11/10/22 171 lb 0.6 oz (77.6 kg)  08/10/22 174 lb (78.9 kg)  07/04/22 174 lb 2 oz (79 kg)    Physical Exam Vitals reviewed.  Constitutional:      Comments: She has had bilateral mastectomies.  Mastectomy scars are well-healed.  There might be some nodularity along the mastectomy site.  Again I have to believe this is scar tissue.  There is no erythema or warmth or swelling associated with these.  I cannot palpate any bilateral axillary lymph nodes.  HENT:     Head: Normocephalic and atraumatic.  Eyes:     Pupils: Pupils are equal, round, and reactive to light.  Cardiovascular:     Rate and Rhythm: Normal rate and regular rhythm.     Heart sounds: Normal heart sounds.  Pulmonary:     Effort: Pulmonary effort is normal.     Breath sounds: Normal breath sounds.  Abdominal:     General: Bowel sounds are normal.     Palpations: Abdomen is soft.  Musculoskeletal:        General: No tenderness or deformity. Normal range of motion.     Cervical back: Normal range of motion.  Lymphadenopathy:     Cervical: No cervical adenopathy.  Skin:    General: Skin is warm and dry.     Findings: No erythema or rash.  Neurological:     Mental Status: She is alert and oriented to person, place, and time.  Psychiatric:        Behavior: Behavior normal.        Thought Content: Thought content normal.        Judgment: Judgment normal.   Lab Results  Component Value Date   WBC 7.4 11/10/2022   HGB 15.7 (H) 11/10/2022   HCT 47.3 (H) 11/10/2022   MCV 85.5 11/10/2022   PLT 331 11/10/2022     Chemistry      Component Value Date/Time   NA 138 08/10/2022 0931   K 3.7 08/10/2022 0931   CL 105 08/10/2022 0931   CO2 25 08/10/2022 0931   BUN 10 08/10/2022 0931   CREATININE 0.73 08/10/2022  0931   CREATININE 0.66 09/12/2017 0747      Component Value Date/Time   CALCIUM 11.3 (H) 08/10/2022 0931   ALKPHOS 107 08/10/2022 0931   AST 18 08/10/2022 0931   ALT 13 08/10/2022 0931   BILITOT 0.3 08/10/2022 0931      Impression and Plan: Ms. Warstler is a very nice 62 year old postmenopausal female.  She is originally from [place].  She moved down to [facility].  She has 2 grandchildren already.  Her children  live on the [facility].  She has early stage breast cancer.  Unfortunately, it was HER-2 positive.  Because this, she required chemotherapy with Herceptin.  Because of the chemotherapy, she has had side effects.  So far, these side effects have not let up.  I am not sure if she will ever have a significant improvement in her quality of life.   We will going give her some IV fluid in the office today.  Hopefully, this will make her feel a little bit better.  Will see what the urinalysis shows.  If there is any indication of a UTI, we will put her on antibiotics.  I would like to cover her empirically if she is going to go up to [place].  I would not want her have a problem up there and need to be seen.  I forgot to mention that she is having problems with her hips.  Again I am not sure what we can really do about this.  I think that she has had x-rays in the past.  We will plan to see her back probably in about 2 months or so.   Josph Macho, MD 8/16/202410:05 AM

## 2022-11-11 LAB — URINE CULTURE: Culture: 20000 — AB

## 2022-11-11 LAB — CANCER ANTIGEN 27.29: CA 27.29: 14.5 U/mL (ref 0.0–38.6)

## 2022-11-13 ENCOUNTER — Telehealth: Payer: Self-pay

## 2022-11-13 NOTE — Telephone Encounter (Signed)
Advised via MyChart.

## 2022-11-13 NOTE — Telephone Encounter (Signed)
-----   Message from Josph Macho sent at 11/11/2022  6:13 AM EDT ----- Please call and let her know that the tumor marker is still normal.  It is only 14.  Outstanding job.  Cindee Lame

## 2022-11-14 ENCOUNTER — Telehealth: Payer: Self-pay | Admitting: *Deleted

## 2022-11-14 NOTE — Telephone Encounter (Signed)
Per 11/14/22 los - called patient to schedule a follow up appointment. She did not want to schedule at the moment and will call back later.

## 2022-11-16 ENCOUNTER — Ambulatory Visit (INDEPENDENT_AMBULATORY_CARE_PROVIDER_SITE_OTHER): Payer: BC Managed Care – PPO | Admitting: Psychology

## 2022-11-16 DIAGNOSIS — F411 Generalized anxiety disorder: Secondary | ICD-10-CM | POA: Diagnosis not present

## 2022-11-16 DIAGNOSIS — F332 Major depressive disorder, recurrent severe without psychotic features: Secondary | ICD-10-CM | POA: Diagnosis not present

## 2022-11-16 DIAGNOSIS — F09 Unspecified mental disorder due to known physiological condition: Secondary | ICD-10-CM | POA: Diagnosis not present

## 2022-11-16 DIAGNOSIS — M329 Systemic lupus erythematosus, unspecified: Secondary | ICD-10-CM

## 2022-11-16 NOTE — Progress Notes (Signed)
   Neuropsychology Feedback Session Stephanie Chase. [facility] Department of Neurology  Reason for Referral:   [name] is a 62 y.o. right-handed Caucasian female referred by Shon Millet, D.O., to characterize her current cognitive functioning and assist with diagnostic clarity and treatment planning in the context of subjective cognitive decline and numerous medical and psychiatric comorbidities.   Feedback:   [name] completed a comprehensive neuropsychological evaluation on 11/08/2022. Please refer to that encounter for the full report and recommendations. Briefly, results suggested a relative weakness across verbal fluency (both phonemic and semantic) and further performance variability across both processing speed and executive functioning. While she exhibited some weakness learning a novel list of words, she recalled this information well after a delay (retention rate of 125%) and performed well across three other memory tasks, thus not suggestive of any consistent memory impairments. Across mood-related questionnaires, her responses suggested acute levels of mild anxiety and severe depression. While she invalidated a more comprehensive personality measure due to inconsistent responding, subscale elevations also would suggest primary difficulties surrounding somatic (i.e., physical) symptoms and severe depression if taken at face value. Cognitive dysfunction surrounding processing speed, executive functioning, verbal fluency, and encoding (i.e., learning) aspects of memory are quite common in individuals with severe psychiatric distress. These difficulties are also common in individuals diagnosed with lupus and who experience chronic pain, frequent headaches, and ongoing sleep dysfunction. The combination of these factors represents the most likely culprit for objective weaknesses across testing and subjective impairments in [name]'s day-to-day life.   [name] was  unaccompanied during the current telephone call. She was within her residence while I was within my office. I discussed the limitations of evaluation and management by telemedicine and the availability of in person appointments. [name] expressed her understanding and agreed to proceed. Content of the current session focused on the results of her neuropsychological evaluation. [name] was given the opportunity to ask questions and her questions were answered. She was encouraged to reach out should additional questions arise. A copy of her report is available to her on MyChart.      One unit [phone] was billed for Dr. Tammy Sours time spent preparing for, conducting, and documenting the current feedback session with [name].

## 2022-11-30 ENCOUNTER — Encounter: Payer: Self-pay | Admitting: Family Medicine

## 2022-12-02 ENCOUNTER — Other Ambulatory Visit: Payer: Self-pay | Admitting: Family Medicine

## 2022-12-02 DIAGNOSIS — E039 Hypothyroidism, unspecified: Secondary | ICD-10-CM

## 2022-12-02 MED ORDER — LIOTHYRONINE SODIUM 5 MCG PO TABS
ORAL_TABLET | ORAL | 0 refills | Status: DC
Start: 2022-12-02 — End: 2023-02-27

## 2022-12-27 ENCOUNTER — Telehealth: Payer: Self-pay

## 2022-12-27 NOTE — Telephone Encounter (Signed)
Spoke with patient informing her that [employer] was requesting records for her disability claim and we needed an updated ROI to send to HIM. Patient currently in [place] and requested document emailed to her. Patient will send ROI back by Friday so request can be completed. ROI sent via email on file.

## 2023-01-10 ENCOUNTER — Encounter: Payer: Self-pay | Admitting: Family Medicine

## 2023-01-10 ENCOUNTER — Ambulatory Visit: Payer: BC Managed Care – PPO | Admitting: Family Medicine

## 2023-01-10 VITALS — BP 112/80 | HR 87 | Temp 98.0°F | Resp 18 | Ht 65.0 in | Wt 173.0 lb

## 2023-01-10 DIAGNOSIS — J432 Centrilobular emphysema: Secondary | ICD-10-CM | POA: Diagnosis not present

## 2023-01-10 DIAGNOSIS — G4734 Idiopathic sleep related nonobstructive alveolar hypoventilation: Secondary | ICD-10-CM | POA: Diagnosis not present

## 2023-01-10 DIAGNOSIS — M5416 Radiculopathy, lumbar region: Secondary | ICD-10-CM | POA: Diagnosis not present

## 2023-01-10 DIAGNOSIS — M542 Cervicalgia: Secondary | ICD-10-CM

## 2023-01-10 DIAGNOSIS — E038 Other specified hypothyroidism: Secondary | ICD-10-CM

## 2023-01-10 DIAGNOSIS — M2550 Pain in unspecified joint: Secondary | ICD-10-CM

## 2023-01-10 MED ORDER — METHOCARBAMOL 500 MG PO TABS: 500.0000 mg | ORAL_TABLET | Freq: Four times a day (QID) | ORAL | 0 refills | Status: DC

## 2023-01-10 MED ORDER — PREDNISONE 20 MG PO TABS: 40.0000 mg | ORAL_TABLET | Freq: Every day | ORAL | 0 refills | Status: AC

## 2023-01-10 NOTE — Assessment & Plan Note (Signed)
Chronic.  Fair control on Cymbalta 60 mg daily, gabapentin 100 mg daily, meloxicam 15 mg daily.  Some flaring currently

## 2023-01-10 NOTE — Assessment & Plan Note (Signed)
Chronic.  Controlled on albuterol every 6 hours as needed.  Continues to smoke.  Advised to stop.  Having some nocturnal hypoxia found on sleep study.  Will refer to pulmonary for both issues.

## 2023-01-10 NOTE — Assessment & Plan Note (Signed)
Chronic.  Patient has mixed up her medications since she has been out of town and taking care of her dying stepfather.  She will get back on track of taking properly her Synthroid 0.075 mg daily and her Cytomel 10 mcg daily.  Defer checking TSH at this point

## 2023-01-10 NOTE — Progress Notes (Signed)
Subjective:    Patient ID: Stephanie Chase, female    DOB: [birth year], 62 y.o.   MRN: [id number]  Chief Complaint  Patient presents with   Follow-up    4 month follow-up on pain 1 week ago Friday, tried to help step dad from falling, having pain in right side, shoulder and back   HPI Polyarthralgia - Managed with Cymbalta 60 mg, (when symptoms flare), Meloxicam , Gabapentin 100 mg (causes severe drowsiness - can't increase dose). Reports last week she was helping her step-father and now she is experiencing right-sided shoulder and back pain. Also describes experiencing a sharp, shooting from her right buttock down her leg that has caused her leg to give out - manages with Aleve or Meloxicam.   S/p Cervical Spine Surgery - Endorses improvement in pain in neck and upper back.   Sleep Study 08/22/22 - Impressions: primary snoring. Oxygen desaturations during sleep. Dysfunctions associated with sleep stages or arousal from sleep. Recommendations: 1. This study does not demonstrate any significant obstructive or central sleep disordered breathing. Of note, the absence of REM sleep during this test may have led to an underestimation of her sleep disordered breathing. Mild intermittent snoring was noted- some weight loss may aid in reducing her snoring. Advised to follow up with primary care regarding her oxygen desaturations during sleep and further evaluation. Optimization of COPD management and complete smoking cessation are recommended. 2. This study shows sleep fragmentation and abnormal sleep stage percentages; these are nonspecific findings and per se do not signify an intrinsic sleep disorder or a cause for the patient's sleep-related symptoms.   Smoking - Reports difficulty with cessation for the past two months.   Hypothyroidism - Managed with Levothyroxine 75 mcg. Reports that she recently just got back from being out of state for the past couple of months, and she refilled her medications  wrong. Was taking levo 2/day and the cytomel 1/day(supposed to other way around) Health Maintenance Due  Topic Date Due   Cervical Cancer Screening (HPV/Pap Cot[facility])  04/06/2015    Past Medical History:  Diagnosis Date   Allergy Seasonal   Anemia    Asthma    Body aches 05/30/2021   Cervical paraspinous muscle spasm    Chronic left-sided low back pain with left-sided sciatica 06/02/2020   COPD (chronic obstructive pulmonary disease)    emphysema   Difficulty hearing 02/13/2012   Excessive sleepiness 01/18/2022   Family history of bladder cancer    Family history of BRCA gene mutation    Family history of breast cancer    Family history of prostate cancer    Generalized anxiety disorder    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 A variant of uncertain significance (VUS) was detected in the ATM gene called [id number]. The report date is 08/10/2019.     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   right, 05/13/20 completed Taxol, on Herceptin   Neck pain on left side 06/02/2020   Neuropathy  Occupational bronchitis 03/03/2015   Osteoporosis    Pain in right shoulder 03/09/2021   Paresthesia 05/13/2020   Plantar fasciitis, bilateral 02/23/2012   Pneumonia    Polyarthralgia 03/09/2021   Pulmonary emphysema 04/15/2020   Pure hypercholesterolemia 04/15/2020   Rosacea 12/15/2021   Systemic lupus erythematosus 06/14/2010    Tobacco use 04/15/2020   Vitamin D deficiency 10/11/2010   Vitiligo 02/23/2012    Past Surgical History:  Procedure Laterality Date   ABLATION ON ENDOMETRIOSIS  2016   APPLICATION OF A-CELL OF EXTREMITY Right 10/22/2019   Procedure: EXCISION OF RIGHT BREAST WOUND WITH PRIMARY CLOSURE;  Surgeon: Peggye Form, DO;  Location: [facility] OR;  Service: Plastics;  Laterality: Right;   BREAST LUMPECTOMY     CERVICAL SPINE SURGERY  2024   DEBRIDEMENT AND CLOSURE WOUND Right 10/22/2019   Procedure: Excision of right breast wound;  Surgeon: Peggye Form, DO;  Location: [facility] OR;  Service: Plastics;  Laterality: Right;   MASTECTOMY W/ SENTINEL NODE BIOPSY Bilateral 09/05/2019   Procedure: BILATERAL MASTECTOMY WITH RIGHT SENTINEL LYMPH NODE BIOPSY;  Surgeon: Almond Lint, MD;  Location: [facility] OR;  Service: General;  Laterality: Bilateral;  COMBINED WITH REGIONAL FOR POST OP PAIN   PORTACATH PLACEMENT Left 09/05/2019   Procedure: INSERTION PORT-A-CATH WITH ULTRASOUND GUIDANCE;  Surgeon: Almond Lint, MD;  Location: [facility] OR;  Service: General;  Laterality: Left;   ROTATOR CUFF REPAIR Right 06/09/2021   TONSILLECTOMY     TUBAL LIGATION     WISDOM TOOTH EXTRACTION       Current Outpatient Medications:    acetaminophen (TYLENOL) 500 MG tablet, Take 500-1,000 mg by mouth every 6 (six) hours as needed for moderate pain. , Disp: , Rfl:    acyclovir ointment (ZOVIRAX) 5 %, Apply 1 Application topically every 3 (three) hours., Disp: 30 g, Rfl: 0   albuterol (PROAIR HFA) 108 (90 Base) MCG/ACT inhaler, Inhale 2 puffs into the lungs every 6 (six) hours as needed for wheezing or shortness of breath., Disp: 1 each, Rfl: 5   cetirizine (ZYRTEC) 10 MG tablet, Take by mouth., Disp: , Rfl:    DULoxetine (CYMBALTA) 60 MG capsule, TAKE 1 CAPSULE (60 MG TOTAL) BY MOUTH DAILY. FILL CYMBALTA 30MG  DAILY FIRST, Disp: 90 capsule, Rfl: 3   fluticasone (FLONASE) 50 MCG/ACT nasal spray, Place 2 sprays into both nostrils in  the morning and at bedtime., Disp: 16 g, Rfl: 5   ipratropium (ATROVENT) 0.06 % nasal spray, Place 2 sprays into both nostrils 4 (four) times daily. As needed for nasal congestion, runny nose, Disp: 15 mL, Rfl: 0   levothyroxine (SYNTHROID) 75 MCG tablet, Take 1 tablet (75 mcg total) by mouth daily before breakfast., Disp: 90 tablet, Rfl: 3   liothyronine (CYTOMEL) 5 MCG tablet, TAKE 2 TABLETS (=10MCG     TOTAL)     DAILY, Disp: 180 tablet, Rfl: 0   LORazepam (ATIVAN) 0.5 MG tablet, PLEASE SEE ATTACHED FOR DETAILED DIRECTIONS, Disp: , Rfl:    meloxicam (MOBIC) 15 MG tablet, TAKE 1 TABLET (15 MG TOTAL) BY MOUTH AS DIRECTED., Disp: 30 tablet, Rfl: 1   methocarbamol (ROBAXIN) 500 MG tablet, Take 1 tablet (500 mg total) by mouth 4 (four) times daily., Disp: 30 tablet, Rfl: 0   predniSONE (DELTASONE) 20 MG tablet, Take 2 tablets (40 mg total) by mouth daily with breakfast for 5 days., Disp: 10 tablet, Rfl: 0   Vitamin D, Ergocalciferol, (DRISDOL) 1.25 MG (50000 UNIT) CAPS capsule, TAKE 1 CAPSULE BY  MOUTH ONE TIME PER WEEK, Disp: 12 capsule, Rfl: 4  Allergies  Allergen Reactions   Bactrim [Sulfamethoxazole-Trimethoprim] Itching and Rash   ROS neg/noncontributory except as noted HPI/below  Objective:  BP 112/80 (BP Location: Left Arm, Patient Position: Sitting, Cuff Size: Large)   Pulse 87   Temp 98 F (36.7 C) (Temporal)   Resp 18   Ht 5\' 5"  (1.651 m)   Wt 173 lb (78.5 kg)   SpO2 97%   BMI 28.79 kg/m  Wt Readings from Last 3 Encounters:  01/10/23 173 lb (78.5 kg)  11/10/22 171 lb 0.6 oz (77.6 kg)  08/10/22 174 lb (78.9 kg)   Physical Exam  Gen: WDWN NAD HEENT: NCAT, conjunctiva not injected, sclera nonicteric NECK:  supple, no thyromegaly, no nodes, no carotid bruits CARDIAC: RRR, S1S2+, no murmur. DP 2+B LUNGS: CTAB. No wheezes ABDOMEN:  BS+, soft, NTND, No HSM, no masses EXT:  no edema MSK: no gross abnormalities.  NEURO: A&O x3.  CN II-XII intact.  PSYCH: normal mood. Good eye  contact.   Back: can stand on heels/toes/1 leg. No TTP spine. +SI tenderness. No rash. MS 5/5 BLE. DTR 2+ BLE. SLR neg B. . +Trapezius tenderness (Right) +Paraspinus muscles tightness (Lower Back) +Pain with extension. And can oly flex to 45 degrees.   Shoulder-normal strength  Assessment & Plan:  Lumbar radiculopathy  Cervicalgia  Centrilobular emphysema (HCC) Assessment & Plan: Chronic.  Controlled on albuterol every 6 hours as needed.  Continues to smoke.  Advised to stop.  Having some nocturnal hypoxia found on sleep study.  Will refer to pulmonary for both issues.  Orders: -     Ambulatory referral to Pulmonology  Nocturnal hypoxia -     Ambulatory referral to Pulmonology  Other specified hypothyroidism Assessment & Plan: Chronic.  Patient has mixed up her medications since she has been out of town and taking care of her dying stepfather.  She will get back on track of taking properly her Synthroid 0.075 mg daily and her Cytomel 10 mcg daily.  Defer checking TSH at this point   Polyarthralgia Assessment & Plan: Chronic.  Fair control on Cymbalta 60 mg daily, gabapentin 100 mg daily, meloxicam 15 mg daily.  Some flaring currently   Other orders -     predniSONE; Take 2 tablets (40 mg total) by mouth daily with breakfast for 5 days.  Dispense: 10 tablet; Refill: 0 -     Methocarbamol; Take 1 tablet (500 mg total) by mouth 4 (four) times daily.  Dispense: 30 tablet; Refill: 0  Cervicalgia-flareup on the right side due to lifting stepdad.  Will do methocarbamol 500 mg every 6 hours as needed and prednisone 40 mg daily for 5 days.  If things continue or progress, follow-up with Ortho  Lumbar radiculopathy-currently on the right side.  Will do prednisone.  Follow-up with Ortho if not helping.  Nocturnal hypoxia-found on sleep study.  Will refer to pulmonary.  May benefit from nocturnal oxygen  Return in about 4 weeks (around 02/07/2023) for chronic follow-up and  labs.   I,Emily Lagle,acting as a Neurosurgeon for Angelena Sole, MD.,have documented all relevant documentation on the behalf of Angelena Sole, MD,as directed by  Angelena Sole, MD while in the presence of Angelena Sole, MD.  I, Angelena Sole, MD, have reviewed all documentation for this visit. The documentation on 01/10/23 for the exam, diagnosis, procedures, and orders are all accurate and complete.   Angelena Sole,  MD

## 2023-01-10 NOTE — Patient Instructions (Signed)
It was very nice to see you today!  No meloxicam while on prednisone-tylenol fine.    PLEASE NOTE:  If you had any lab tests please let us know if you have not heard back within a few days. You may see your results on MyChart before we have a chance to review them but we will give you a call once they are reviewed by [place]. If we ordered any referrals today, please let us know if you have not heard from their office within the next week.   Please try these tips to maintain a healthy lifestyle:  Eat most of your calories during the day when you are active. Eliminate processed foods including packaged sweets (pies, cakes, cookies), reduce intake of potatoes, white bread, white pasta, and white rice. Look for whole grain options, oat flour or almond flour.  Each meal should contain half fruits/vegetables, one quarter protein, and one quarter carbs (no bigger than a computer mouse).  Cut down on sweet beverages. This includes juice, soda, and sweet tea. Also watch fruit intake, though this is a healthier sweet option, it still contains natural sugar! Limit to 3 servings daily.  Drink at least 1 glass of water with each meal and aim for at least 8 glasses per day  Exercise at least 150 minutes every week.

## 2023-01-16 ENCOUNTER — Telehealth: Payer: Self-pay

## 2023-01-16 NOTE — Telephone Encounter (Signed)
Received request for records from 03/28/22 to present from [employer]. Per [facility] records faxed on 01/10/2023. Called [employer] and spoke with Raheem informing him records were delivered 01/10/23. Also gave phone number to medical records if he had any further questions.

## 2023-02-06 ENCOUNTER — Encounter: Payer: Self-pay | Admitting: Family Medicine

## 2023-02-06 ENCOUNTER — Ambulatory Visit (INDEPENDENT_AMBULATORY_CARE_PROVIDER_SITE_OTHER): Payer: BC Managed Care – PPO | Admitting: Family Medicine

## 2023-02-06 VITALS — BP 120/88 | HR 83 | Temp 98.7°F | Resp 18 | Ht 65.0 in | Wt 175.0 lb

## 2023-02-06 DIAGNOSIS — M159 Polyosteoarthritis, unspecified: Secondary | ICD-10-CM | POA: Diagnosis not present

## 2023-02-06 DIAGNOSIS — E559 Vitamin D deficiency, unspecified: Secondary | ICD-10-CM

## 2023-02-06 DIAGNOSIS — E039 Hypothyroidism, unspecified: Secondary | ICD-10-CM

## 2023-02-06 MED ORDER — MELOXICAM 15 MG PO TABS: 15.0000 mg | ORAL_TABLET | ORAL | 1 refills | Status: DC

## 2023-02-06 MED ORDER — METHOCARBAMOL 500 MG PO TABS: 500.0000 mg | ORAL_TABLET | Freq: Four times a day (QID) | ORAL | 1 refills | Status: AC

## 2023-02-06 MED ORDER — FLUTICASONE PROPIONATE 50 MCG/ACT NA SUSP: 2.0000 | Freq: Two times a day (BID) | NASAL | 3 refills | Status: AC

## 2023-02-06 NOTE — Patient Instructions (Addendum)
It was very nice to see you today! Try meloxicam daily to see if helps or not(do for 2 wks)  [facility]  [address] on the 1st floor Phone number [phone]    PLEASE NOTE:  If you had any lab tests please let us know if you have not heard back within a few days. You may see your results on MyChart before we have a chance to review them but we will give you a call once they are reviewed by [place]. If we ordered any referrals today, please let us know if you have not heard from their office within the next week.   Please try these tips to maintain a healthy lifestyle:  Eat most of your calories during the day when you are active. Eliminate processed foods including packaged sweets (pies, cakes, cookies), reduce intake of potatoes, white bread, white pasta, and white rice. Look for whole grain options, oat flour or almond flour.  Each meal should contain half fruits/vegetables, one quarter protein, and one quarter carbs (no bigger than a computer mouse).  Cut down on sweet beverages. This includes juice, soda, and sweet tea. Also watch fruit intake, though this is a healthier sweet option, it still contains natural sugar! Limit to 3 servings daily.  Drink at least 1 glass of water with each meal and aim for at least 8 glasses per day  Exercise at least 150 minutes every week.

## 2023-02-06 NOTE — Progress Notes (Signed)
Subjective:     Patient ID: Stephanie Chase, female    DOB: Jul 04, 1960, 62 y.o.   MRN: [id number]  Chief Complaint  Patient presents with   Medical Management of Chronic Issues    4 week chronic follow-up and labs     HPI  Polyarthralgia - She complains of constant pain with minimal relief. Pt is on Cymbalta 60 mg daily, Meloxicam 15 mg daily, and methocarbamol 7.5 mg. She is only taking half a tablet of her methocarbamol. She is unsure if her medication are helping mange her pain. She also reports feels like her hip is "rubbing its way through". Was previously in PT years ago. Plans to start doing exercises and stretching she learned while in PT. Has been trying to watch her posture and increase lumbar support, keeping her shoulders back.   Osteoporosis - Pt was previously receiving Prolia injections. Her last Prolia injection was 5-6 months ago, was due to receive next injection in 01/2023. She currently stopped Prolia at her dentists suggestion, stating she can't take Prolia 6 months prior to major dental procedures. She reports needing a tooth extraction.   Hypothyroidism - She is compliant with Synthroid 75 mcg daily and her Cytomel 10 mcg daily  Breast cancer - Last followed up with Dr. Myna Hidalgo of oncology on 8/16.   Vitamin D Deficiency - Stopped Ergo 50K units once weekly.   Not taking calcium supplementation.   Health Maintenance Due  Topic Date Due   Cervical Cancer Screening (HPV/Pap Cotest)  04/06/2015    Past Medical History:  Diagnosis Date   Allergy Seasonal   Anemia    Asthma    Body aches 05/30/2021   Cervical paraspinous muscle spasm    Chronic left-sided low back pain with left-sided sciatica 06/02/2020   COPD (chronic obstructive pulmonary disease)    emphysema   Difficulty hearing 02/13/2012   Excessive sleepiness 01/18/2022   Family history of bladder cancer    Family history of BRCA gene mutation    Family history of breast cancer    Family  history of prostate cancer    Generalized anxiety disorder    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 A variant of uncertain significance (VUS) was detected in the ATM gene called c.3834C>A. The report date is 08/10/2019.     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   right, 05/13/20 completed Taxol, on Herceptin   Neck pain on left side 06/02/2020   Neuropathy    Occupational bronchitis 03/03/2015   Osteoporosis    Pain in right shoulder 03/09/2021   Paresthesia 05/13/2020   Plantar fasciitis, bilateral 02/23/2012   Pneumonia    Polyarthralgia 03/09/2021   Pulmonary emphysema 04/15/2020   Pure hypercholesterolemia 04/15/2020   Rosacea 12/15/2021   Systemic lupus erythematosus 06/14/2010   Tobacco use 04/15/2020   Vitamin D deficiency 10/11/2010   Vitiligo 02/23/2012    Past Surgical History:  Procedure Laterality Date   ABLATION ON ENDOMETRIOSIS  2016  APPLICATION OF A-CELL OF EXTREMITY Right 10/22/2019   Procedure: EXCISION OF RIGHT BREAST WOUND WITH PRIMARY CLOSURE;  Surgeon: Peggye Form, DO;  Location: [facility];  Service: Plastics;  Laterality: Right;   BREAST LUMPECTOMY     CERVICAL SPINE SURGERY  2024   DEBRIDEMENT AND CLOSURE WOUND Right 10/22/2019   Procedure: Excision of right breast wound;  Surgeon: Peggye Form, DO;  Location: [facility];  Service: Plastics;  Laterality: Right;   MASTECTOMY W/ SENTINEL  NODE BIOPSY Bilateral 09/05/2019   Procedure: BILATERAL MASTECTOMY WITH RIGHT SENTINEL LYMPH NODE BIOPSY;  Surgeon: Almond Lint, MD;  Location: [facility] OR;  Service: General;  Laterality: Bilateral;  COMBINED WITH REGIONAL FOR POST OP PAIN   PORTACATH PLACEMENT Left 09/05/2019   Procedure: INSERTION PORT-A-CATH WITH ULTRASOUND GUIDANCE;  Surgeon: Almond Lint, MD;  Location: [facility];  Service: General;  Laterality: Left;   ROTATOR CUFF REPAIR Right 06/09/2021   TONSILLECTOMY     TUBAL LIGATION     WISDOM TOOTH EXTRACTION       Current Outpatient Medications:    acetaminophen (TYLENOL) 500 MG tablet, Take 500-1,000 mg by mouth every 6 (six) hours as needed for moderate pain. , Disp: , Rfl:    acyclovir ointment (ZOVIRAX) 5 %, Apply 1 Application topically every 3 (three) hours., Disp: 30 g, Rfl: 0   albuterol (PROAIR HFA) 108 (90 Base) MCG/ACT inhaler, Inhale 2 puffs into the lungs every 6 (six) hours as needed for wheezing or shortness of breath., Disp: 1 each, Rfl: 5   ALPRAZolam (XANAX) 0.25 MG tablet, Take 0.25 mg by mouth daily as needed., Disp: , Rfl:    cetirizine (ZYRTEC) 10 MG tablet, Take by mouth., Disp: , Rfl:    DULoxetine (CYMBALTA) 60 MG capsule, TAKE 1 CAPSULE (60 MG TOTAL) BY MOUTH DAILY. FILL CYMBALTA 30MG  DAILY FIRST, Disp: 90 capsule, Rfl: 3   ipratropium (ATROVENT) 0.06 % nasal spray, Place 2 sprays into both nostrils 4 (four) times daily. As needed for nasal congestion, runny nose, Disp: 15 mL, Rfl: 0   levothyroxine (SYNTHROID) 75 MCG tablet, Take 1 tablet (75 mcg total) by mouth daily before breakfast., Disp: 90 tablet, Rfl: 3   liothyronine (CYTOMEL) 5 MCG tablet, TAKE 2 TABLETS (=10MCG     TOTAL)     DAILY, Disp: 180 tablet, Rfl: 0   LORazepam (ATIVAN) 0.5 MG tablet, PLEASE SEE ATTACHED FOR DETAILED DIRECTIONS, Disp: , Rfl:    Vitamin D, Ergocalciferol, (DRISDOL) 1.25 MG (50000 UNIT) CAPS capsule, TAKE 1 CAPSULE BY MOUTH ONE TIME PER WEEK, Disp: 12 capsule, Rfl: 4    fluticasone (FLONASE) 50 MCG/ACT nasal spray, Place 2 sprays into both nostrils in the morning and at bedtime., Disp: 48 g, Rfl: 3   meloxicam (MOBIC) 15 MG tablet, Take 1 tablet (15 mg total) by mouth as directed., Disp: 90 tablet, Rfl: 1   methocarbamol (ROBAXIN) 500 MG tablet, Take 1 tablet (500 mg total) by mouth 4 (four) times daily., Disp: 30 tablet, Rfl: 1  Allergies  Allergen Reactions   Bactrim [Sulfamethoxazole-Trimethoprim] Itching and Rash   ROS neg/noncontributory except as noted HPI/below      Objective:     BP 120/88   Pulse 83   Temp 98.7 F (37.1 C) (Temporal)   Resp 18   Ht 5\' 5"  (1.651 m)   Wt 175 lb (79.4 kg)   SpO2 95%   BMI 29.12 kg/m  Wt Readings from Last 3 Encounters:  02/06/23 175 lb (79.4  kg)  01/10/23 173 lb (78.5 kg)  11/10/22 171 lb 0.6 oz (77.6 kg)    Physical Exam   Gen: WDWN NAD HEENT: NCAT, conjunctiva not injected, sclera nonicteric NECK:  supple, no thyromegaly, no nodes, no carotid bruits CARDIAC: RRR, S1S2+, no murmur. DP 2+B LUNGS: CTAB. No wheezes ABDOMEN:  BS+, soft, NTND, No HSM, no masses EXT:  no edema MSK: no gross abnormalities.  NEURO: A&O x3.  CN II-XII intact.  PSYCH: normal mood. Good eye contact     Assessment & Plan:  Acquired hypothyroidism -     T3, free -     T4, free -     TSH  Vitamin D deficiency -     VITAMIN D 25 Hydroxy (Vit-D Deficiency, Fractures)  Hypercalcemia -     PTH, Intact (ICMA) and Ionized Calcium -     Comprehensive metabolic panel  Other orders -     Fluticasone Propionate; Place 2 sprays into both nostrils in the morning and at bedtime.  Dispense: 48 g; Refill: 3 -     Meloxicam; Take 1 tablet (15 mg total) by mouth as directed.  Dispense: 90 tablet; Refill: 1 -     Methocarbamol; Take 1 tablet (500 mg total) by mouth 4 (four) times daily.  Dispense: 30 tablet; Refill: 1    Return in about 3 months (around 05/09/2023) for chronic follow-up.   I, Isabelle Course, acting as a  scribe for Angelena Sole, MD., have documented all relevant documentation on the behalf of Angelena Sole, MD, as directed by  Angelena Sole, MD while in the presence of Angelena Sole, MD.  I, Isabelle Course, have reviewed all documentation for this visit. The documentation on 02/06/23 for the exam, diagnosis, procedures, and orders are all accurate and complete.  Isabelle Course

## 2023-02-07 ENCOUNTER — Ambulatory Visit: Payer: BC Managed Care – PPO | Admitting: Family Medicine

## 2023-02-07 LAB — T3, FREE: T3, Free: 4 pg/mL (ref 2.3–4.2)

## 2023-02-07 LAB — COMPREHENSIVE METABOLIC PANEL
ALT: 14 U/L (ref 0–35)
AST: 22 U/L (ref 0–37)
Albumin: 4.3 g/dL (ref 3.5–5.2)
Alkaline Phosphatase: 75 U/L (ref 39–117)
BUN: 11 mg/dL (ref 6–23)
CO2: 25 meq/L (ref 19–32)
Calcium: 10.5 mg/dL (ref 8.4–10.5)
Chloride: 101 meq/L (ref 96–112)
Creatinine, Ser: 0.71 mg/dL (ref 0.40–1.20)
GFR: 91.93 mL/min (ref >60.00)
Glucose, Bld: 88 mg/dL (ref 70–99)
Potassium: 4.2 meq/L (ref 3.5–5.1)
Sodium: 135 meq/L (ref 135–145)
Total Bilirubin: 0.3 mg/dL (ref 0.2–1.2)
Total Protein: 7.3 g/dL (ref 6.0–8.3)

## 2023-02-07 LAB — T4, FREE: Free T4: 0.64 ng/dL (ref 0.60–1.60)

## 2023-02-07 LAB — TSH: TSH: 1.07 u[IU]/mL (ref 0.35–5.50)

## 2023-02-07 LAB — VITAMIN D 25 HYDROXY (VIT D DEFICIENCY, FRACTURES): VITD: 37.29 ng/mL (ref 30.00–100.00)

## 2023-02-08 LAB — EXTRA SPECIMEN

## 2023-02-08 LAB — PTH, INTACT (ICMA) AND IONIZED CALCIUM
Calcium, Ion: 5.5 mg/dL (ref 4.7–5.5)
Calcium: 10.3 mg/dL (ref 8.6–10.4)
PTH: 85 pg/mL — ABNORMAL HIGH (ref 16–77)

## 2023-02-08 NOTE — Assessment & Plan Note (Signed)
Chronic.  Controlled.  On Vit D50k weekly per onc

## 2023-02-08 NOTE — Assessment & Plan Note (Signed)
Chronic.  Not ideal.  Meds not holding.  Advised to f/u ortho.  Also, calcium high-will do w/u

## 2023-02-08 NOTE — Assessment & Plan Note (Signed)
Chronic.  Currently on  Synthroid 0.075 mg daily and  Cytomel 10 mcg daily.

## 2023-02-08 NOTE — Assessment & Plan Note (Addendum)
Chronic.  Will do w/u.  May be from Prolia, other

## 2023-02-08 NOTE — Progress Notes (Signed)
The calcium is normal now.  But PTH is elevated.  Not sure if prolia doing this or other.  Refer to endocrine.  Rest of labs ok

## 2023-02-09 ENCOUNTER — Other Ambulatory Visit: Payer: Self-pay | Admitting: *Deleted

## 2023-02-09 DIAGNOSIS — R7989 Other specified abnormal findings of blood chemistry: Secondary | ICD-10-CM

## 2023-02-12 ENCOUNTER — Other Ambulatory Visit: Payer: Self-pay | Admitting: Family Medicine

## 2023-02-14 ENCOUNTER — Encounter: Payer: Self-pay | Admitting: Hematology & Oncology

## 2023-02-14 NOTE — Telephone Encounter (Signed)
Telephone call  

## 2023-02-27 ENCOUNTER — Other Ambulatory Visit: Payer: Self-pay | Admitting: Family Medicine

## 2023-02-27 DIAGNOSIS — E039 Hypothyroidism, unspecified: Secondary | ICD-10-CM

## 2023-02-27 MED ORDER — LIOTHYRONINE SODIUM 5 MCG PO TABS: ORAL_TABLET | ORAL | 0 refills | Status: DC

## 2023-02-28 ENCOUNTER — Ambulatory Visit: Payer: BC Managed Care – PPO | Admitting: Hematology & Oncology

## 2023-02-28 ENCOUNTER — Inpatient Hospital Stay: Payer: BC Managed Care – PPO

## 2023-03-01 ENCOUNTER — Other Ambulatory Visit: Payer: Self-pay | Admitting: Family Medicine

## 2023-03-01 DIAGNOSIS — E039 Hypothyroidism, unspecified: Secondary | ICD-10-CM

## 2023-03-26 ENCOUNTER — Telehealth: Payer: Self-pay | Admitting: Neurology

## 2023-03-26 NOTE — Telephone Encounter (Signed)
Hilda with [employer] called to verify pt's last appt and current appt

## 2023-04-18 DIAGNOSIS — D485 Neoplasm of uncertain behavior of skin: Secondary | ICD-10-CM | POA: Diagnosis not present

## 2023-04-18 DIAGNOSIS — L309 Dermatitis, unspecified: Secondary | ICD-10-CM | POA: Diagnosis not present

## 2023-04-18 DIAGNOSIS — C44329 Squamous cell carcinoma of skin of other parts of face: Secondary | ICD-10-CM | POA: Diagnosis not present

## 2023-04-18 DIAGNOSIS — D2339 Other benign neoplasm of skin of other parts of face: Secondary | ICD-10-CM | POA: Diagnosis not present

## 2023-04-19 DIAGNOSIS — F331 Major depressive disorder, recurrent, moderate: Secondary | ICD-10-CM | POA: Diagnosis not present

## 2023-04-23 ENCOUNTER — Encounter: Payer: Self-pay | Admitting: Hematology & Oncology

## 2023-04-23 NOTE — Telephone Encounter (Signed)
Copied from CRM [id number]. Topic: Clinical - Lab/Test Results >> Apr 23, 2023 12:22 PM Sim Boast F wrote: Reason for CRM: Patient scheduled lab appt for tomorrow 04/24/23, says Dr. Ruthine Dose wants to recheck her labs but I do not see any orders in Epic. Please advise.  Spoke with patient and she stated she had gotten confused, Endocrinology placed orders for labs, Dr. Ruthine Dose did not order any labs, if any, will done at upcoming appointment. She needed to schedule follow-up with Dr. Ruthine Dose for 3 months. Patient scheduled for 05/09/23.

## 2023-04-24 ENCOUNTER — Other Ambulatory Visit: Payer: BC Managed Care – PPO

## 2023-04-25 DIAGNOSIS — C44329 Squamous cell carcinoma of skin of other parts of face: Secondary | ICD-10-CM | POA: Diagnosis not present

## 2023-04-26 ENCOUNTER — Other Ambulatory Visit: Payer: Self-pay

## 2023-04-26 ENCOUNTER — Encounter: Payer: Self-pay | Admitting: Family Medicine

## 2023-04-26 ENCOUNTER — Encounter: Payer: Self-pay | Admitting: Hematology & Oncology

## 2023-04-26 DIAGNOSIS — E039 Hypothyroidism, unspecified: Secondary | ICD-10-CM

## 2023-04-27 DIAGNOSIS — Z008 Encounter for other general examination: Secondary | ICD-10-CM | POA: Diagnosis not present

## 2023-04-30 ENCOUNTER — Other Ambulatory Visit: Payer: Medicare HMO

## 2023-04-30 DIAGNOSIS — E039 Hypothyroidism, unspecified: Secondary | ICD-10-CM | POA: Diagnosis not present

## 2023-05-01 ENCOUNTER — Other Ambulatory Visit: Payer: BC Managed Care – PPO

## 2023-05-01 DIAGNOSIS — D485 Neoplasm of uncertain behavior of skin: Secondary | ICD-10-CM | POA: Diagnosis not present

## 2023-05-01 DIAGNOSIS — C44329 Squamous cell carcinoma of skin of other parts of face: Secondary | ICD-10-CM | POA: Diagnosis not present

## 2023-05-01 DIAGNOSIS — C44300 Unspecified malignant neoplasm of skin of unspecified part of face: Secondary | ICD-10-CM

## 2023-05-01 HISTORY — DX: Unspecified malignant neoplasm of skin of unspecified part of face: C44.300

## 2023-05-02 DIAGNOSIS — F331 Major depressive disorder, recurrent, moderate: Secondary | ICD-10-CM | POA: Diagnosis not present

## 2023-05-03 ENCOUNTER — Encounter: Payer: Self-pay | Admitting: "Endocrinology

## 2023-05-03 ENCOUNTER — Ambulatory Visit (INDEPENDENT_AMBULATORY_CARE_PROVIDER_SITE_OTHER): Payer: BC Managed Care – PPO | Admitting: "Endocrinology

## 2023-05-03 DIAGNOSIS — Z8639 Personal history of other endocrine, nutritional and metabolic disease: Secondary | ICD-10-CM | POA: Diagnosis not present

## 2023-05-03 DIAGNOSIS — M810 Age-related osteoporosis without current pathological fracture: Secondary | ICD-10-CM

## 2023-05-03 NOTE — Progress Notes (Signed)
 Outpatient Endocrinology Note Obadiah Birmingham, MD    Stephanie Chase 09/06/1960 [id number]  Referring Provider: Wendolyn Jenkins Jansky, MD Primary Care Provider: Wendolyn Jenkins Jansky, MD Reason for consultation: Subjective   Assessment & Plan  Diagnoses and all orders for this visit:  Hypercalcemia -     PTH, intact and calcium  -     Renal function panel -     VITAMIN D  25 Hydroxy (Vit-D Deficiency, Fractures)  History of hyperparathyroidism  Osteoporosis without current pathological fracture, unspecified osteoporosis type  PTH previously elevated at 85 on 01/2023 Highest corrected calcium  at 10.8 in 07/2022  02/06/23 Corrected calcium  at 10.3, PTH normalized Will continue monitoring   History of low Vit D On Vit D 50,000 once weekly for 2 years Vit D elevated now to 66 Instructed to hold it for 3 months, and then restart at once in 2 months  Osteoporosis  05/23/22: DXA reported  T-score of -3.4 at L2-L4 spine  Did prolia  twice, last about a year ago in 2024, stopped due to tooth extraction Patient is reluctant to treatment  Discussed fosamax and reclast in details with pros and cons  Patient will call in in interested    Return in about 6 months (around 10/31/2023) for visit + labs before next visit.   I have reviewed current medications, nurse's notes, allergies, vital signs, past medical and surgical history, family medical history, and social history for this encounter. Counseled patient on symptoms, examination findings, lab findings, imaging results, treatment decisions and monitoring and prognosis. The patient understood the recommendations and agrees with the treatment plan. All questions regarding treatment plan were fully answered.  Obadiah Birmingham, MD  05/03/23   History of Present Illness HPI   Stephanie Chase is a 63 y.o. female referred by Dr. Wendolyn for evaluation and management of hypercalcemia.    PTH previously elevated at 85 on 01/2023 Highest  corrected calcium  at 10.8 in 07/2022  02/06/23 Corrected calcium  at 10.3, PTH normalized  Patient denies a history of kidney stones  She  current hematuria No polyuria No nocturia No thirst No renal failure No anorexia No abdominal pain No heartburn Yes constipation Yes nausea or vomiting No history of peptic ulcer disease No depression Yes confusion No excessive fatigue Yes fracture No osteoporosis Yes headaches Yes numbness Yes, has neuropathy since chemo for breast cancer  tingling Yes  She takes Calcium  No She takes Vitamin D  supplements Yes, on 50,000 units once a week She a history of taking chronic lithium No She a recent history of thiazide diuretic intake No  She family history of renal stones/hypercalcemia No a personal history of MEN syndromes/medullary thyroid  cancer/ pheochromocytoma No  Physical Exam  BP 122/80 (BP Location: Left Arm, Patient Position: Sitting, Cuff Size: Large)   Pulse 89   Resp 18   Ht 5' 5 (1.651 m)   Wt 175 lb (79.4 kg)   SpO2 97%   BMI 29.12 kg/m    Constitutional: well developed, well nourished Head: normocephalic, atraumatic Eyes: sclera anicteric, no redness Neck: supple Lungs: normal respiratory effort Neurology: alert and oriented Skin: dry, no appreciable rashes Musculoskeletal: no appreciable defects Psychiatric: normal mood and affect   Current Medications Patient's Medications  New Prescriptions   No medications on file  Previous Medications   ACETAMINOPHEN  (TYLENOL ) 500 MG TABLET    Take 500-1,000 mg by mouth every 6 (six) hours as needed for moderate pain.    ACYCLOVIR  OINTMENT (ZOVIRAX ) 5 %  Apply 1 Application topically every 3 (three) hours.   ALBUTEROL  (VENTOLIN  HFA) 108 (90 BASE) MCG/ACT INHALER    TAKE 2 PUFFS BY MOUTH EVERY 6 HOURS AS NEEDED FOR WHEEZE OR SHORTNESS OF BREATH   ALPRAZOLAM  (XANAX ) 0.25 MG TABLET    Take 0.25 mg by mouth daily as needed.   CETIRIZINE (ZYRTEC) 10 MG TABLET    Take by  mouth.   DULOXETINE  (CYMBALTA ) 60 MG CAPSULE    TAKE 1 CAPSULE (60 MG TOTAL) BY MOUTH DAILY. FILL CYMBALTA  30MG  DAILY FIRST   FLUTICASONE  (FLONASE ) 50 MCG/ACT NASAL SPRAY    Place 2 sprays into both nostrils in the morning and at bedtime.   IPRATROPIUM (ATROVENT ) 0.06 % NASAL SPRAY    Place 2 sprays into both nostrils 4 (four) times daily. As needed for nasal congestion, runny nose   LEVOTHYROXINE  (SYNTHROID ) 75 MCG TABLET    Take 1 tablet (75 mcg total) by mouth daily before breakfast.   LIOTHYRONINE  (CYTOMEL ) 5 MCG TABLET    TAKE 2 TABLETS (=10MCG     TOTAL)     DAILY   LORAZEPAM  (ATIVAN ) 0.5 MG TABLET    PLEASE SEE ATTACHED FOR DETAILED DIRECTIONS   MELOXICAM  (MOBIC ) 15 MG TABLET    Take 1 tablet (15 mg total) by mouth as directed.   METHOCARBAMOL  (ROBAXIN ) 500 MG TABLET    Take 1 tablet (500 mg total) by mouth 4 (four) times daily.   VITAMIN D , ERGOCALCIFEROL , (DRISDOL ) 1.25 MG (50000 UNIT) CAPS CAPSULE    TAKE 1 CAPSULE BY MOUTH ONE TIME PER WEEK  Modified Medications   No medications on file  Discontinued Medications   No medications on file    Allergies Allergies  Allergen Reactions   Bactrim  [Sulfamethoxazole -Trimethoprim] Itching and Rash    Past Medical History Past Medical History:  Diagnosis Date   Allergy Seasonal   Anemia    Asthma    Body aches 05/30/2021   Cervical paraspinous muscle spasm    Chronic left-sided low back pain with left-sided sciatica 06/02/2020   COPD (chronic obstructive pulmonary disease)    emphysema   Difficulty hearing 02/13/2012   Excessive sleepiness 01/18/2022   Family history of bladder cancer    Family history of BRCA gene mutation    Family history of breast cancer    Family history of prostate cancer    Generalized anxiety disorder    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 A variant of uncertain significance (VUS) was detected in the ATM gene called c.3834C>A. The report date is 08/10/2019.     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   right, 05/13/20 completed Taxol , on Herceptin    Neck pain on left side 06/02/2020   Neuropathy    Occupational bronchitis 03/03/2015   Osteoporosis    Pain in right shoulder 03/09/2021   Paresthesia 05/13/2020   Plantar fasciitis, bilateral 02/23/2012   Pneumonia    Polyarthralgia 03/09/2021   Pulmonary emphysema 04/15/2020   Pure hypercholesterolemia  04/15/2020   Rosacea 12/15/2021   Systemic lupus erythematosus 06/14/2010   Tobacco use 04/15/2020   Vitamin D  deficiency 10/11/2010   Vitiligo 02/23/2012    Past Surgical History Past Surgical History:  Procedure Laterality Date   ABLATION ON ENDOMETRIOSIS  2016   APPLICATION OF A-CELL OF EXTREMITY Right 10/22/2019   Procedure: EXCISION OF RIGHT BREAST WOUND WITH PRIMARY CLOSURE;  Surgeon: Lowery Estefana RAMAN, DO;  Location: [facility];  Service: Plastics;  Laterality: Right;   BREAST LUMPECTOMY     CERVICAL SPINE SURGERY  2024   DEBRIDEMENT AND CLOSURE WOUND Right 10/22/2019   Procedure: Excision of right breast wound;  Surgeon: Lowery Estefana RAMAN, DO;  Location: [facility];  Service: Plastics;  Laterality: Right;   MASTECTOMY W/ SENTINEL NODE BIOPSY Bilateral 09/05/2019   Procedure: BILATERAL MASTECTOMY WITH RIGHT SENTINEL LYMPH NODE BIOPSY;  Surgeon: Aron Shoulders, MD;  Location: [facility];  Service: General;  Laterality: Bilateral;  COMBINED WITH REGIONAL FOR POST OP PAIN    PORTACATH PLACEMENT Left 09/05/2019   Procedure: INSERTION PORT-A-CATH WITH ULTRASOUND GUIDANCE;  Surgeon: Aron Shoulders, MD;  Location: [facility];  Service: General;  Laterality: Left;   ROTATOR CUFF REPAIR Right 06/09/2021   TONSILLECTOMY     TUBAL LIGATION     WISDOM TOOTH EXTRACTION      Family History family history includes Alzheimer's disease in her father and another family member; Asthma in her mother; BRCA 1/2 in her daughter; Bladder Cancer (age of onset: 37) in her mother; Breast cancer (age of onset: 32) in an other family member; Breast cancer (age of onset: 82) in her mother; COPD in her father; Cancer in her cousin, father, maternal aunt, maternal uncle, mother, and paternal uncle; Dementia in her father; Depression in her mother; Diabetes in her mother; Hearing loss in her father and mother; Heart attack in her father; Heart disease in her father; Hyperlipidemia in her father; Hypertension in her father; Kidney disease in her mother; Prostate cancer in her father; Vision loss in her mother.  Social History Social History   Socioeconomic History   Marital status: Divorced    Spouse name: Not on file   Number of children: 2   Years of education: 12   Highest education level: 12th grade  Occupational History   Occupation: Disability    Comment: Banking  Tobacco Use   Smoking status: Every Day    Current packs/day: 0.25    Average packs/day: 0.3 packs/day for 52.1 years (13.0 ttl pk-yrs)    Types: Cigarettes    Start date: 1973   Smokeless tobacco: Never  Vaping Use   Vaping status: Never Used  Substance and Sexual Activity   Alcohol use: Not Currently   Drug use: No   Sexual activity: Yes    Partners: Male    Birth control/protection: None    Comment: BTL and ablation  Other Topics Concern   Not on file  Social History Narrative   Disabled-commercial lending assist.   Are you right handed or left handed? Right    Are you currently employed ? no   What is your  current occupation?   Do you live at home alone? no   Who lives with you? Boufriend   What type of home do you live in: 1 story or 2 story?  1      Social Drivers of Corporate Investment Banker Strain: Low Risk  (02/03/2023)   Overall Financial Resource Strain (CARDIA)  Difficulty of Paying Living Expenses: Not very hard  Food Insecurity: No Food Insecurity (02/03/2023)   Hunger Vital Sign    Worried About Running Out of Food in the Last Year: Never true    Ran Out of Food in the Last Year: Never true  Transportation Needs: No Transportation Needs (02/03/2023)   PRAPARE - Administrator, Civil Service (Medical): No    Lack of Transportation (Non-Medical): No  Physical Activity: Insufficiently Active (02/03/2023)   Exercise Vital Sign    Days of Exercise per Week: 2 days    Minutes of Exercise per Session: 30 min  Stress: Stress Concern Present (02/03/2023)   [employer] of Occupational Health - Occupational Stress Questionnaire    Feeling of Stress : Rather much  Social Connections: Moderately Isolated (02/03/2023)   Social Connection and Isolation Panel [NHANES]    Frequency of Communication with Friends and Family: More than three times a week    Frequency of Social Gatherings with Friends and Family: Patient declined    Attends Religious Services: Never    Database Administrator or Organizations: No    Attends Engineer, Structural: Not on file    Marital Status: Living with partner  Intimate Partner Violence: Not on file    Lab Results  Component Value Date   CHOL 231 (H) 02/26/2020   Lab Results  Component Value Date   HDL 54 02/26/2020   Lab Results  Component Value Date   LDLCALC 142 (H) 02/26/2020   Lab Results  Component Value Date   TRIG 175 (H) 02/26/2020   Lab Results  Component Value Date   CHOLHDL 4.3 02/26/2020   Lab Results  Component Value Date   CREATININE 0.58 04/30/2023   Lab Results  Component Value Date   GFR  91.93 02/06/2023      Component Value Date/Time   NA 137 04/30/2023 1018   K 4.3 04/30/2023 1018   CL 103 04/30/2023 1018   CO2 27 04/30/2023 1018   GLUCOSE 92 04/30/2023 1018   BUN 10 04/30/2023 1018   CREATININE 0.58 04/30/2023 1018   CALCIUM  10.6 (H) 04/30/2023 1018   CALCIUM  10.6 (H) 04/30/2023 1018   PROT 7.3 02/06/2023 1524   ALBUMIN 4.3 02/06/2023 1524   AST 22 02/06/2023 1524   AST 19 11/10/2022 0932   ALT 14 02/06/2023 1524   ALT 14 11/10/2022 0932   ALKPHOS 75 02/06/2023 1524   BILITOT 0.3 02/06/2023 1524   BILITOT 0.5 11/10/2022 0932   GFRNONAA >60 11/10/2022 0932   GFRNONAA >89 09/29/2013 1424   GFRAA >60 12/29/2019 0854   GFRAA >89 09/29/2013 1424      Latest Ref Rng & Units 04/30/2023   10:18 AM 02/06/2023    3:24 PM 11/10/2022    9:32 AM  BMP  Glucose 65 - 99 mg/dL 92  88  867   BUN 7 - 25 mg/dL 10  11  10    Creatinine 0.50 - 1.05 mg/dL 9.41  9.28  9.22   BUN/Creat Ratio 6 - 22 (calc) SEE NOTE:     Sodium 135 - 146 mmol/L 137  135  136   Potassium 3.5 - 5.3 mmol/L 4.3  4.2  4.4   Chloride 98 - 110 mmol/L 103  101  101   CO2 20 - 32 mmol/L 27  25  28    Calcium  8.6 - 10.4 mg/dL 8.6 - 89.5 mg/dL 89.3    89.3  89.6  10.5  11.0        Component Value Date/Time   WBC 7.4 11/10/2022 0932   WBC 8.1 10/22/2019 1430   RBC 5.53 (H) 11/10/2022 0932   HGB 15.7 (H) 11/10/2022 0932   HCT 47.3 (H) 11/10/2022 0932   PLT 331 11/10/2022 0932   MCV 85.5 11/10/2022 0932   MCH 28.4 11/10/2022 0932   MCHC 33.2 11/10/2022 0932   RDW 13.9 11/10/2022 0932   LYMPHSABS 1.6 11/10/2022 0932   MONOABS 0.5 11/10/2022 0932   EOSABS 0.1 11/10/2022 0932   BASOSABS 0.0 11/10/2022 0932   Lab Results  Component Value Date   TSH 1.07 02/06/2023   TSH 0.410 08/10/2022   TSH 0.514 05/16/2022   FREET4 0.64 02/06/2023   FREET4 1.3 09/12/2017   FREET4 1.1 03/31/2016         Parts of this note may have been dictated using voice recognition software. There may be variances  in spelling and vocabulary which are unintentional. Not all errors are proofread. Please notify the dino if any discrepancies are noted or if the meaning of any statement is not clear.

## 2023-05-05 LAB — VITAMIN D 25 HYDROXY (VIT D DEFICIENCY, FRACTURES): Vit D, 25-Hydroxy: 83 ng/mL (ref 30–100)

## 2023-05-05 LAB — RENAL FUNCTION PANEL
Albumin: 4.4 g/dL (ref 3.6–5.1)
BUN: 10 mg/dL (ref 7–25)
CO2: 27 mmol/L (ref 20–32)
Calcium: 10.6 mg/dL — ABNORMAL HIGH (ref 8.6–10.4)
Chloride: 103 mmol/L (ref 98–110)
Creat: 0.58 mg/dL (ref 0.50–1.05)
Glucose, Bld: 92 mg/dL (ref 65–99)
Phosphorus: 3.2 mg/dL (ref 2.5–4.5)
Potassium: 4.3 mmol/L (ref 3.5–5.3)
Sodium: 137 mmol/L (ref 135–146)

## 2023-05-05 LAB — VITAMIN D 1,25 DIHYDROXY
Vitamin D 1, 25 (OH)2 Total: 75 pg/mL — ABNORMAL HIGH (ref 18–72)
Vitamin D2 1, 25 (OH)2: 57 pg/mL
Vitamin D3 1, 25 (OH)2: 18 pg/mL

## 2023-05-05 LAB — PTH, INTACT AND CALCIUM
Calcium: 10.6 mg/dL — ABNORMAL HIGH (ref 8.6–10.4)
PTH: 70 pg/mL (ref 16–77)

## 2023-05-05 LAB — MAGNESIUM: Magnesium: 2.1 mg/dL (ref 1.5–2.5)

## 2023-05-08 ENCOUNTER — Ambulatory Visit: Payer: Medicare HMO | Admitting: Family Medicine

## 2023-05-08 ENCOUNTER — Encounter: Payer: Self-pay | Admitting: Family Medicine

## 2023-05-08 VITALS — BP 130/85 | HR 80 | Temp 98.4°F | Resp 18 | Ht 65.0 in | Wt 174.2 lb

## 2023-05-08 DIAGNOSIS — C50411 Malignant neoplasm of upper-outer quadrant of right female breast: Secondary | ICD-10-CM

## 2023-05-08 DIAGNOSIS — E038 Other specified hypothyroidism: Secondary | ICD-10-CM

## 2023-05-08 DIAGNOSIS — E039 Hypothyroidism, unspecified: Secondary | ICD-10-CM

## 2023-05-08 DIAGNOSIS — M2550 Pain in unspecified joint: Secondary | ICD-10-CM | POA: Diagnosis not present

## 2023-05-08 DIAGNOSIS — M810 Age-related osteoporosis without current pathological fracture: Secondary | ICD-10-CM | POA: Diagnosis not present

## 2023-05-08 DIAGNOSIS — Z170 Estrogen receptor positive status [ER+]: Secondary | ICD-10-CM

## 2023-05-08 DIAGNOSIS — E559 Vitamin D deficiency, unspecified: Secondary | ICD-10-CM

## 2023-05-08 MED ORDER — LEVOTHYROXINE SODIUM 75 MCG PO TABS: 75.0000 ug | ORAL_TABLET | Freq: Every day | ORAL | 3 refills | Status: AC

## 2023-05-08 MED ORDER — LIOTHYRONINE SODIUM 5 MCG PO TABS
10.0000 ug | ORAL_TABLET | Freq: Every day | ORAL | 1 refills | Status: DC
Start: 2023-05-08 — End: 2023-09-05

## 2023-05-08 NOTE — Progress Notes (Unsigned)
Subjective:     Patient ID: Stephanie Chase, female    DOB: 1960-07-20, 63 y.o.   MRN: [id number]  Chief Complaint  Patient presents with   Medical Management of Chronic Issues    3 month follow-up      HPI  Polyarthralgia - She complains of constant pain with minimal relief. Pt is on Cymbalta 60 mg daily, Meloxicam 15 mg daily, and methocarbamol 500 mg. She is only taking half a tablet of her methocarbamol. She is unsure if her medication are helping mange her pain. She also reports feels like her hip is "rubbing its way through". Was previously in PT years ago. Plans to start doing exercises and stretching she learned while in PT. Has been trying to watch her posture and increase lumbar support, keeping her shoulders back. Seeing ortho and [facility].  Had MRI.   Osteoporosis - Pt was previously receiving Prolia injections. Her last Prolia injection was 5-6 months ago, was due to receive next injection in 01/2023. She currently stopped Prolia at her dentists suggestion, stating she can't take Prolia 6 months prior to major dental procedures. She reports needing a tooth extraction.   Hypothyroidism - She is compliant with Synthroid 75 mcg daily and her Cytomel 10 mcg daily  Breast cancer - Last followed up with Dr. Myna Hidalgo of oncology on 8/16.   Vitamin D Deficiency - taking Ergo 50K units once weekly.   Hypercalcemia-Not taking calcium supplementation.per pt, w/u not done   Health Maintenance Due  Topic Date Due   Medicare Annual Wellness (AWV)  Never done    Past Medical History:  Diagnosis Date   Allergy Seasonal   Anemia    Asthma    Body aches 05/30/2021   Cervical paraspinous muscle spasm    Chronic left-sided low back pain with left-sided sciatica 06/02/2020   COPD (chronic obstructive pulmonary disease)    emphysema   Difficulty hearing 02/13/2012   Excessive sleepiness 01/18/2022   Family history of bladder cancer    Family history of BRCA gene mutation     Family history of breast cancer    Family history of prostate cancer    Generalized anxiety disorder    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 A variant of uncertain significance (VUS) was detected in the ATM gene called c.3834C>A. The report date is 08/10/2019.     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   right, 05/13/20 completed Taxol, on Herceptin   Neck pain on left side 06/02/2020   Neuropathy    Occupational bronchitis 03/03/2015   Osteoporosis    Pain in right shoulder 03/09/2021   Paresthesia 05/13/2020   Plantar fasciitis, bilateral 02/23/2012   Pneumonia    Polyarthralgia 03/09/2021   Pulmonary emphysema 04/15/2020   Pure hypercholesterolemia 04/15/2020   Rosacea 12/15/2021   Systemic lupus erythematosus 06/14/2010   Tobacco use 04/15/2020   Vitamin D deficiency 10/11/2010   Vitiligo 02/23/2012    Past Surgical History:  Procedure Laterality  Date   ABLATION ON ENDOMETRIOSIS  2016   APPLICATION OF A-CELL OF EXTREMITY Right 10/22/2019   Procedure: EXCISION OF RIGHT BREAST WOUND WITH PRIMARY CLOSURE;  Surgeon: Peggye Form, DO;  Location: [facility];  Service: Plastics;  Laterality: Right;   BREAST LUMPECTOMY     CERVICAL SPINE SURGERY  2024   DEBRIDEMENT AND CLOSURE WOUND Right 10/22/2019   Procedure: Excision of right breast wound;  Surgeon: Peggye Form, DO;  Location: [facility];  Service: Plastics;   Laterality: Right;   MASTECTOMY W/ SENTINEL NODE BIOPSY Bilateral 09/05/2019   Procedure: BILATERAL MASTECTOMY WITH RIGHT SENTINEL LYMPH NODE BIOPSY;  Surgeon: Almond Lint, MD;  Location: [facility];  Service: General;  Laterality: Bilateral;  COMBINED WITH REGIONAL FOR POST OP PAIN   PORTACATH PLACEMENT Left 09/05/2019   Procedure: INSERTION PORT-A-CATH WITH ULTRASOUND GUIDANCE;  Surgeon: Almond Lint, MD;  Location: [facility];  Service: General;  Laterality: Left;   ROTATOR CUFF REPAIR Right 06/09/2021   TONSILLECTOMY     TUBAL LIGATION     WISDOM TOOTH EXTRACTION       Current Outpatient Medications:    acetaminophen (TYLENOL) 500 MG tablet, Take 500-1,000 mg by mouth every 6 (six) hours as needed for moderate pain. , Disp: , Rfl:    acyclovir ointment (ZOVIRAX) 5 %, Apply 1 Application topically every 3 (three) hours., Disp: 30 g, Rfl: 0   albuterol (VENTOLIN HFA) 108 (90 Base) MCG/ACT inhaler, TAKE 2 PUFFS BY MOUTH EVERY 6 HOURS AS NEEDED FOR WHEEZE OR SHORTNESS OF BREATH, Disp: 8.5 each, Rfl: 5   ALPRAZolam (XANAX) 0.25 MG tablet, Take 0.25 mg by mouth daily as needed., Disp: , Rfl:    cetirizine (ZYRTEC) 10 MG tablet, Take by mouth., Disp: , Rfl:    clobetasol cream (TEMOVATE) 0.05 %, 1 application Externally Twice a day for 14 days, Disp: , Rfl:    DULoxetine (CYMBALTA) 60 MG capsule, TAKE 1 CAPSULE (60 MG TOTAL) BY MOUTH DAILY. FILL CYMBALTA 30MG  DAILY FIRST, Disp: 90 capsule, Rfl: 3   fluticasone (FLONASE) 50 MCG/ACT nasal spray, Place 2 sprays into both nostrils in the morning and at bedtime., Disp: 48 g, Rfl: 3   ipratropium (ATROVENT) 0.06 % nasal spray, Place 2 sprays into both nostrils 4 (four) times daily. As needed for nasal congestion, runny nose, Disp: 15 mL, Rfl: 0   levothyroxine (SYNTHROID) 75 MCG tablet, Take 1 tablet (75 mcg total) by mouth daily before breakfast., Disp: 90 tablet, Rfl: 3   liothyronine (CYTOMEL) 5 MCG tablet, TAKE 2 TABLETS (=10MCG     TOTAL)     DAILY,  Disp: 180 tablet, Rfl: 0   LORazepam (ATIVAN) 0.5 MG tablet, PLEASE SEE ATTACHED FOR DETAILED DIRECTIONS, Disp: , Rfl:    meloxicam (MOBIC) 15 MG tablet, Take 1 tablet (15 mg total) by mouth as directed., Disp: 90 tablet, Rfl: 1   methocarbamol (ROBAXIN) 500 MG tablet, Take 1 tablet (500 mg total) by mouth 4 (four) times daily., Disp: 30 tablet, Rfl: 1   Vitamin D, Ergocalciferol, (DRISDOL) 1.25 MG (50000 UNIT) CAPS capsule, TAKE 1 CAPSULE BY MOUTH ONE TIME PER WEEK, Disp: 12 capsule, Rfl: 4  Allergies  Allergen Reactions   Bactrim [Sulfamethoxazole-Trimethoprim] Itching and Rash   ROS neg/noncontributory except as noted HPI/below      Objective:     BP 130/85   Pulse 80   Temp 98.4 F (36.9 C) (Temporal)   Resp 18   Ht 5\' 5"  (1.651 m)  Wt 174 lb 4 oz (79 kg)   SpO2 97%   BMI 29.00 kg/m  Wt Readings from Last 3 Encounters:  05/08/23 174 lb 4 oz (79 kg)  05/03/23 175 lb (79.4 kg)  02/06/23 175 lb (79.4 kg)    Physical Exam   Gen: WDWN NAD HEENT: NCAT, conjunctiva not injected, sclera nonicteric NECK:  supple, no thyromegaly, no nodes, no carotid bruits CARDIAC: RRR, S1S2+, no murmur. DP 2+B LUNGS: CTAB. No wheezes ABDOMEN:  BS+, soft, NTND, No HSM, no masses EXT:  no edema MSK: no gross abnormalities.  NEURO: A&O x3.  CN II-XII intact.  PSYCH: normal mood. Good eye contact     Assessment & Plan:  There are no diagnoses linked to this encounter.   No follow-ups on file.   Angelena Sole, MD

## 2023-05-08 NOTE — Patient Instructions (Signed)
It was very nice to see you today! See ortho     PLEASE NOTE:  If you had any lab tests please let us know if you have not heard back within a few days. You may see your results on MyChart before we have a chance to review them but we will give you a call once they are reviewed by [place]. If we ordered any referrals today, please let us know if you have not heard from their office within the next week.   Please try these tips to maintain a healthy lifestyle:  Eat most of your calories during the day when you are active. Eliminate processed foods including packaged sweets (pies, cakes, cookies), reduce intake of potatoes, white bread, white pasta, and white rice. Look for whole grain options, oat flour or almond flour.  Each meal should contain half fruits/vegetables, one quarter protein, and one quarter carbs (no bigger than a computer mouse).  Cut down on sweet beverages. This includes juice, soda, and sweet tea. Also watch fruit intake, though this is a healthier sweet option, it still contains natural sugar! Limit to 3 servings daily.  Drink at least 1 glass of water with each meal and aim for at least 8 glasses per day  Exercise at least 150 minutes every week.

## 2023-05-09 ENCOUNTER — Ambulatory Visit: Payer: Self-pay | Admitting: Family Medicine

## 2023-05-09 DIAGNOSIS — L930 Discoid lupus erythematosus: Secondary | ICD-10-CM | POA: Diagnosis not present

## 2023-05-09 NOTE — Assessment & Plan Note (Signed)
Chronic.  Controlled. Currently on  Synthroid 0.075 mg daily and  Cytomel 10 mcg daily.

## 2023-05-10 ENCOUNTER — Ambulatory Visit: Payer: Medicare HMO | Admitting: Pulmonary Disease

## 2023-05-10 ENCOUNTER — Encounter: Payer: Self-pay | Admitting: Pulmonary Disease

## 2023-05-10 VITALS — BP 122/85 | HR 84 | Temp 98.2°F | Ht 65.0 in | Wt 175.4 lb

## 2023-05-10 DIAGNOSIS — J431 Panlobular emphysema: Secondary | ICD-10-CM

## 2023-05-10 DIAGNOSIS — G4736 Sleep related hypoventilation in conditions classified elsewhere: Secondary | ICD-10-CM | POA: Diagnosis not present

## 2023-05-10 DIAGNOSIS — J439 Emphysema, unspecified: Secondary | ICD-10-CM

## 2023-05-10 MED ORDER — BENZONATATE 200 MG PO CAPS: 200.0000 mg | ORAL_CAPSULE | Freq: Three times a day (TID) | ORAL | 2 refills | Status: DC | PRN

## 2023-05-10 MED ORDER — NICOTINE 14 MG/24HR TD PT24: 14.0000 mg | MEDICATED_PATCH | Freq: Every day | TRANSDERMAL | 2 refills | Status: DC

## 2023-05-10 NOTE — Patient Instructions (Signed)
Tessalon perles for coughing  Nicoderm patch  Continue to work on quitting  Breathing study at next visit  Will see you when you get back from your travels  3-4 month follow up    You will need oxygen at night when we are able to get it ordered and you are back in town, we can repeat a sleep study sometime soon as well to confirm that you still need oxygen

## 2023-05-10 NOTE — Progress Notes (Signed)
Stephanie Chase    [id number]    [birth year]  Primary Care Physician:Kulik, Maryruth Hancock, MD  Referring Physician: Jeani Sow, MD [address] [place],  [place] [address]  Chief complaint:   Patient being seen for nocturnal hypoxemia  HPI:  Recently had a sleep study performed by [facility] neurology for fatigue  Did not have significant sleep apnea but had nocturnal desaturations about 25 minutes, oxygen nadir of 86%.  Recent CT scan of the chest shows emphysema Does not have significant shortness of breath unless walking uphill  Active smoker about half a pack a day She attributes her smoking to stress  Efforts at quitting in the past associated with significant coughing Did not tolerate Chantix previously  No previous PFT  No pertinent occupational history Outpatient Encounter Medications as of 05/10/2023  Medication Sig   acetaminophen (TYLENOL) 500 MG tablet Take 500-1,000 mg by mouth every 6 (six) hours as needed for moderate pain.    acyclovir ointment (ZOVIRAX) 5 % Apply 1 Application topically every 3 (three) hours.   albuterol (VENTOLIN HFA) 108 (90 Base) MCG/ACT inhaler TAKE 2 PUFFS BY MOUTH EVERY 6 HOURS AS NEEDED FOR WHEEZE OR SHORTNESS OF BREATH   ALPRAZolam (XANAX) 0.25 MG tablet Take 0.25 mg by mouth daily as needed.   benzonatate (TESSALON) 200 MG capsule Take 1 capsule (200 mg total) by mouth 3 (three) times daily as needed for cough.   cetirizine (ZYRTEC) 10 MG tablet Take by mouth.   clobetasol cream (TEMOVATE) 0.05 % 1 application Externally Twice a day for 14 days   DULoxetine (CYMBALTA) 60 MG capsule TAKE 1 CAPSULE (60 MG TOTAL) BY MOUTH DAILY. FILL CYMBALTA 30MG  DAILY FIRST   fluticasone (FLONASE) 50 MCG/ACT nasal spray Place 2 sprays into both nostrils in the morning and at bedtime.   levothyroxine (SYNTHROID) 75 MCG tablet Take 1 tablet (75 mcg total) by mouth daily before breakfast.   liothyronine (CYTOMEL) 5 MCG tablet Take 2  tablets (10 mcg total) by mouth daily. TAKE 2 TABLETS (=10MCG     TOTAL)     DAILY   LORazepam (ATIVAN) 0.5 MG tablet PLEASE SEE ATTACHED FOR DETAILED DIRECTIONS   meloxicam (MOBIC) 15 MG tablet Take 1 tablet (15 mg total) by mouth as directed.   methocarbamol (ROBAXIN) 500 MG tablet Take 1 tablet (500 mg total) by mouth 4 (four) times daily.   nicotine (NICODERM CQ) 14 mg/24hr patch Place 1 patch (14 mg total) onto the skin daily.   Vitamin D, Ergocalciferol, (DRISDOL) 1.25 MG (50000 UNIT) CAPS capsule TAKE 1 CAPSULE BY MOUTH ONE TIME PER WEEK   [DISCONTINUED] ipratropium (ATROVENT) 0.06 % nasal spray Place 2 sprays into both nostrils 4 (four) times daily. As needed for nasal congestion, runny nose   No facility-administered encounter medications on file as of 05/10/2023.    Allergies as of 05/10/2023 - Review Complete 05/10/2023  Allergen Reaction Noted   Bactrim [sulfamethoxazole-trimethoprim] Itching and Rash 09/29/2019    Past Medical History:  Diagnosis Date   Allergy Seasonal   Anemia    Asthma    Body aches 05/30/2021   Cervical paraspinous muscle spasm    Chronic left-sided low back pain with left-sided sciatica 06/02/2020   COPD (chronic obstructive pulmonary disease)    emphysema   Difficulty hearing 02/13/2012   Excessive sleepiness 01/18/2022   Family history of bladder cancer    Family history of BRCA gene mutation  Family history of breast cancer    Family history of prostate cancer    Generalized anxiety disorder    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 A variant of uncertain significance (VUS) was detected in the ATM gene called c.3834C>A. The report date is 08/10/2019.     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/2019   right, 05/13/20 completed Taxol, on Herceptin   Neck pain on left side 06/02/2020   Neuropathy    Occupational bronchitis 03/03/2015   Osteoporosis    Pain in right shoulder 03/09/2021   Paresthesia 05/13/2020   Plantar fasciitis, bilateral 02/23/2012   Pneumonia    Polyarthralgia 03/09/2021   Pulmonary emphysema 04/15/2020   Pure hypercholesterolemia 04/15/2020   Rosacea 12/15/2021   Skin cancer of face 05/01/2023   scc   Systemic lupus erythematosus 06/14/2010   Tobacco use 04/15/2020   Vitamin D deficiency 10/11/2010   Vitiligo 02/23/2012    Past Surgical History:  Procedure Laterality Date   ABLATION ON ENDOMETRIOSIS  2016   APPLICATION OF A-CELL OF EXTREMITY Right 10/22/2019   Procedure: EXCISION OF RIGHT BREAST WOUND WITH PRIMARY CLOSURE;  Surgeon: Peggye Form, DO;  Location: [facility];  Service: Plastics;  Laterality: Right;   BREAST LUMPECTOMY     CERVICAL SPINE SURGERY  2024   DEBRIDEMENT AND CLOSURE WOUND Right 10/22/2019   Procedure: Excision of right breast wound;  Surgeon: Peggye Form, DO;  Location: [facility];  Service: Plastics;  Laterality: Right;   MASTECTOMY W/ SENTINEL NODE BIOPSY Bilateral 09/05/2019   Procedure: BILATERAL MASTECTOMY WITH RIGHT SENTINEL LYMPH NODE BIOPSY;  Surgeon: Almond Lint, MD;  Location: [facility];  Service: General;  Laterality: Bilateral;  COMBINED WITH REGIONAL FOR POST OP PAIN   PORTACATH PLACEMENT Left 09/05/2019   Procedure: INSERTION PORT-A-CATH WITH ULTRASOUND GUIDANCE;  Surgeon: Almond Lint, MD;  Location: [facility];  Service: General;  Laterality: Left;   ROTATOR CUFF REPAIR Right 06/09/2021   TONSILLECTOMY      TUBAL LIGATION     WISDOM TOOTH EXTRACTION      Family History  Problem Relation Age of Onset   Breast cancer Mother 42   Diabetes Mother    Bladder Cancer Mother 51       ureteral cancer   Cancer Mother        blood cancer   Asthma Mother    Depression Mother    Hearing loss Mother    Kidney disease Mother    Vision loss Mother    Dementia Father    Heart attack Father    Hyperlipidemia Father    Hypertension Father    Alzheimer's disease Father    Prostate cancer Father        dx. >50   Cancer Father    COPD Father        smoked   Hearing loss Father    Heart disease Father    BRCA 1/2 Daughter    Cancer Maternal Aunt  Cancer Maternal Uncle        prostate or colon   Cancer Paternal Uncle    Cancer Cousin        unknown type; dx. in her 9s (maternal cousin)   Alzheimer's disease Other    Breast cancer Other 45       maternal great-aunt   Sleep apnea Neg Hx     Social History   Socioeconomic History   Marital status: Divorced    Spouse name: Not on file   Number of children: 2   Years of education: 12   Highest education level: 12th grade  Occupational History   Occupation: Disability    Comment: Banking  Tobacco Use   Smoking status: Every Day    Current packs/day: 2.00    Average packs/day: 2.0 packs/day for 52.1 years (104.2 ttl pk-yrs)    Types: Cigarettes    Start date: 1973   Smokeless tobacco: Never   Tobacco comments:    3/4 ppd currently [name] Murders, [facility] 05/10/23  Vaping Use   Vaping status: Never Used  Substance and Sexual Activity   Alcohol use: Not Currently   Drug use: No   Sexual activity: Yes    Partners: Male    Birth control/protection: None    Comment: BTL and ablation  Other Topics Concern   Not on file  Social History Narrative   Disabled-commercial lending assist.   Are you right handed or left handed? Right    Are you currently employed ? no   What is your current occupation?   Do you live at home alone? no    Who lives with you? Boufriend   What type of home do you live in: 1 story or 2 story?  1      Social Drivers of Corporate investment banker Strain: Low Risk  (05/07/2023)   Overall Financial Resource Strain (CARDIA)    Difficulty of Paying Living Expenses: Not very hard  Food Insecurity: No Food Insecurity (05/07/2023)   Hunger Vital Sign    Worried About Running Out of Food in the Last Year: Never true    Ran Out of Food in the Last Year: Never true  Transportation Needs: No Transportation Needs (05/07/2023)   PRAPARE - Administrator, Civil Service (Medical): No    Lack of Transportation (Non-Medical): No  Physical Activity: Insufficiently Active (05/07/2023)   Exercise Vital Sign    Days of Exercise per Week: 2 days    Minutes of Exercise per Session: 30 min  Stress: Stress Concern Present (05/07/2023)   [employer] of Occupational Health - Occupational Stress Questionnaire    Feeling of Stress : Rather much  Social Connections: Socially Isolated (05/07/2023)   Social Connection and Isolation Panel [NHANES]    Frequency of Communication with Friends and Family: More than three times a week    Frequency of Social Gatherings with Friends and Family: Once a week    Attends Religious Services: Never    Database administrator or Organizations: No    Attends Engineer, structural: Not on file    Marital Status: Divorced  Intimate Partner Violence: Not on file    Review of Systems  Constitutional:  Positive for fatigue.  Respiratory:  Positive for cough.   Psychiatric/Behavioral:  Positive for sleep disturbance.     Vitals:   05/10/23 1551  BP: 122/85  Pulse: 84  Temp: 98.2 F (36.8 C)  SpO2: 95%  Physical Exam Constitutional:      Appearance: Normal appearance.  HENT:     Head: Normocephalic.     Mouth/Throat:     Mouth: Mucous membranes are moist.  Eyes:     General: No scleral icterus. Cardiovascular:     Rate and Rhythm: Normal  rate and regular rhythm.     Heart sounds: No murmur heard. Pulmonary:     Effort: No respiratory distress.     Breath sounds: No stridor. No wheezing or rhonchi.     Comments: Decreased air movement bilaterally Musculoskeletal:     Cervical back: No rigidity or tenderness.  Neurological:     Mental Status: She is alert.  Psychiatric:        Mood and Affect: Mood normal.      Data Reviewed: Sleep study 08/14/2022 with an AHI of 0.4, O2 nadir of 86% with desaturations about 25 minutes  CT chest for lung cancer screening April 03, 2022 significant for emphysematous changes-CT scan was reviewed by myself with the patient   Assessment:  Chronic obstructive pulmonary disease Stage is unclear  Active smoker, about half a pack a day  Nocturnal desaturations  Smoking cessation counseling  Plan/Recommendations:  Prescription for nicotine patches  She will continue using albuterol as needed  She is traveling in the next couple of days  Discussed oxygen supplementation at night  May consider repeating nocturnal oximetry/sleep study whenever she is back in town, she may be traveling for 2 to 3 months she stated  We will order for pulmonary function test to be done next time she comes in  Prescription for [employer] written for cough  Encouraged to give [place] a call with any significant concerns  Importance of needing to quit discussed extensively   Virl Diamond MD [facility] 05/10/2023, 4:25 PM  CC: Jeani Sow, MD

## 2023-05-22 ENCOUNTER — Institutional Professional Consult (permissible substitution): Payer: BC Managed Care – PPO | Admitting: Pulmonary Disease

## 2023-06-05 DIAGNOSIS — J431 Panlobular emphysema: Secondary | ICD-10-CM | POA: Diagnosis not present

## 2023-06-05 DIAGNOSIS — E039 Hypothyroidism, unspecified: Secondary | ICD-10-CM | POA: Diagnosis not present

## 2023-06-05 DIAGNOSIS — Z0000 Encounter for general adult medical examination without abnormal findings: Secondary | ICD-10-CM | POA: Diagnosis not present

## 2023-06-05 DIAGNOSIS — E559 Vitamin D deficiency, unspecified: Secondary | ICD-10-CM | POA: Diagnosis not present

## 2023-06-05 DIAGNOSIS — F411 Generalized anxiety disorder: Secondary | ICD-10-CM | POA: Diagnosis not present

## 2023-06-05 DIAGNOSIS — Z1211 Encounter for screening for malignant neoplasm of colon: Secondary | ICD-10-CM | POA: Diagnosis not present

## 2023-06-06 ENCOUNTER — Telehealth: Payer: Self-pay | Admitting: *Deleted

## 2023-06-06 NOTE — Telephone Encounter (Signed)
 Received request for Long Term Disability from [employer] 06/05/2023. Placed call to patient to let her know that the paperwork was ready for pick up and  was unable to leave a message, mailbox was full. Will fax requested information to the company and leave a copy up front for her to pick up if she wants it.

## 2023-06-07 DIAGNOSIS — R0609 Other forms of dyspnea: Secondary | ICD-10-CM | POA: Diagnosis not present

## 2023-06-07 DIAGNOSIS — R093 Abnormal sputum: Secondary | ICD-10-CM | POA: Diagnosis not present

## 2023-06-07 DIAGNOSIS — J449 Chronic obstructive pulmonary disease, unspecified: Secondary | ICD-10-CM | POA: Diagnosis not present

## 2023-06-07 DIAGNOSIS — R053 Chronic cough: Secondary | ICD-10-CM | POA: Diagnosis not present

## 2023-06-07 DIAGNOSIS — F17210 Nicotine dependence, cigarettes, uncomplicated: Secondary | ICD-10-CM | POA: Diagnosis not present

## 2023-06-07 DIAGNOSIS — J3089 Other allergic rhinitis: Secondary | ICD-10-CM | POA: Diagnosis not present

## 2023-06-07 DIAGNOSIS — R062 Wheezing: Secondary | ICD-10-CM | POA: Diagnosis not present

## 2023-06-13 NOTE — Telephone Encounter (Signed)
 Copied from CRM [id number]. Topic: General - Other >> Jun 13, 2023  4:28 PM Almira Coaster wrote: Reason for CRM: [employer] is calling to follow up on  Long Term Disability from that were faxed to the office, original fax date 05/10/2023 and they have not heard back. They will be re-faxing the form today.  Form was faxed to pulmonary and pulmonary faxed them back. Per note:   Lurlean Leyden, [facility]     06/06/23  4:38 PM Note Received request for Long Term Disability from [employer] 06/05/2023. Placed call to patient to let her know that the paperwork was ready for pick up and  was unable to leave a message, mailbox was full. Will fax requested information to the company and leave a copy up front for her to pick up if she wants it.

## 2023-06-14 ENCOUNTER — Telehealth: Payer: Self-pay | Admitting: *Deleted

## 2023-06-14 NOTE — Telephone Encounter (Signed)
 Copied from CRM [id number]. Topic: General - Other >> Jun 13, 2023  4:28 PM Almira Coaster wrote: Reason for CRM: [employer] is calling to follow up on  Long Term Disability from that were faxed to the office, original fax date 05/10/2023 and they have not heard back. They will be re-faxing the form today. >> Jun 14, 2023  1:53 PM Shelbie Proctor wrote: Patient [phone] wants to speak with the nurse on the forms Long Term Disability from [place]. Patient states that they are going to cancel her disability, since the provider has not filled in the information. Patient states she did not call yesterday, maybe it was the insurance company. Please advise and call back today.

## 2023-06-14 NOTE — Telephone Encounter (Signed)
 Patient aware that we just received the form, advised that I would give her a call when completed and faxed.

## 2023-06-18 ENCOUNTER — Telehealth: Payer: Self-pay

## 2023-06-18 DIAGNOSIS — Z0279 Encounter for issue of other medical certificate: Secondary | ICD-10-CM

## 2023-06-18 NOTE — Telephone Encounter (Signed)
 Patient notified that form has been faxed and that there is a $29 charge.

## 2023-06-18 NOTE — Telephone Encounter (Signed)
 Faxed note to [employer].

## 2023-06-21 DIAGNOSIS — J431 Panlobular emphysema: Secondary | ICD-10-CM | POA: Diagnosis not present

## 2023-06-21 DIAGNOSIS — Z122 Encounter for screening for malignant neoplasm of respiratory organs: Secondary | ICD-10-CM | POA: Diagnosis not present

## 2023-06-21 DIAGNOSIS — R911 Solitary pulmonary nodule: Secondary | ICD-10-CM | POA: Diagnosis not present

## 2023-06-21 DIAGNOSIS — F17210 Nicotine dependence, cigarettes, uncomplicated: Secondary | ICD-10-CM | POA: Diagnosis not present

## 2023-06-21 DIAGNOSIS — J9811 Atelectasis: Secondary | ICD-10-CM | POA: Diagnosis not present

## 2023-06-26 ENCOUNTER — Telehealth: Payer: Self-pay | Admitting: Family Medicine

## 2023-06-26 NOTE — Telephone Encounter (Signed)
 Patient dropped off document  Long term disability form , to be filled out by provider. Patient requested to send it back via Fax within 5-days. Document is located in providers tray at front office.Please advise at  [phone].

## 2023-06-26 NOTE — Telephone Encounter (Signed)
 Form placed on providers desk

## 2023-06-27 ENCOUNTER — Telehealth: Payer: Self-pay | Admitting: *Deleted

## 2023-06-27 NOTE — Telephone Encounter (Signed)
 Copied from CRM [id number]. Topic: General - Other >> Jun 27, 2023  2:40 PM Jon Gills C wrote: Reason for CRM: Marchelle Folks, Nurse Case Manager called in regarding patient questions about patient out of work duties, would like a callback at   [phone]  Left detailed message informing that forms were on provider's desk to be completed.

## 2023-07-02 DIAGNOSIS — M5090 Cervical disc disorder, unspecified, unspecified cervical region: Secondary | ICD-10-CM | POA: Diagnosis not present

## 2023-07-02 DIAGNOSIS — R42 Dizziness and giddiness: Secondary | ICD-10-CM | POA: Diagnosis not present

## 2023-07-02 DIAGNOSIS — C7981 Secondary malignant neoplasm of breast: Secondary | ICD-10-CM | POA: Diagnosis not present

## 2023-07-09 ENCOUNTER — Telehealth (INDEPENDENT_AMBULATORY_CARE_PROVIDER_SITE_OTHER): Admitting: Family Medicine

## 2023-07-09 ENCOUNTER — Encounter: Payer: Self-pay | Admitting: Family Medicine

## 2023-07-09 VITALS — Ht 65.0 in

## 2023-07-09 DIAGNOSIS — M5442 Lumbago with sciatica, left side: Secondary | ICD-10-CM | POA: Diagnosis not present

## 2023-07-09 DIAGNOSIS — R5383 Other fatigue: Secondary | ICD-10-CM

## 2023-07-09 DIAGNOSIS — G8929 Other chronic pain: Secondary | ICD-10-CM

## 2023-07-09 DIAGNOSIS — G629 Polyneuropathy, unspecified: Secondary | ICD-10-CM | POA: Diagnosis not present

## 2023-07-09 DIAGNOSIS — G4710 Hypersomnia, unspecified: Secondary | ICD-10-CM

## 2023-07-09 DIAGNOSIS — R4189 Other symptoms and signs involving cognitive functions and awareness: Secondary | ICD-10-CM

## 2023-07-09 DIAGNOSIS — R52 Pain, unspecified: Secondary | ICD-10-CM

## 2023-07-09 NOTE — Progress Notes (Signed)
 MyChart Video Visit Virtual Visit via Video Note   This visit type was conducted w/patient consent. This format is felt to be most appropriate for this patient at this time. Physical exam was limited by quality of the video and audio technology used for the visit. CMA was able to get the patient set up on a video visit.  Patient location: Home. Patient and provider in visit Provider location: Office  I discussed the limitations of evaluation and management by telemedicine and the availability of in person appointments. The patient expressed understanding and agreed to proceed.  Visit Date: 07/09/2023  Today's healthcare provider: Angelena Sole, MD     Subjective:    Patient ID: Stephanie Chase, female    DOB: April 22, 1960, 63 y.o.   MRN: [id number]  Chief Complaint  Patient presents with   Disability form    HPI Disability form Neurospych testing in August 2024-[facility] hard to multitask w/mult distractions Discussed the use of AI scribe software for clinical note transcription with the patient, who gave verbal consent to proceed.  History of Present Illness Stephanie Chase is a 63 year old female who presents for clarification of disability limitations due to impaired judgment, thinking, and pain.  She experiences difficulty with sitting or standing for extended periods due to neuropathy, which causes her body to stiffen. She needs to stop every hour during long drives to loosen up and stand for about 20 minutes. She experiences significant pain when attempting household tasks like vacuuming or cleaning the bathroom, indicating no change in her condition whether at home or traveling.  In August 2024, she underwent neuropsychological testing at [facility], which she found unhelpful as she could look around the room for answers. She notes a difference between focusing in a quiet room and multitasking in a public or work environment with distractions.  She has been  on social security disability since 2022 and is also receiving long-term disability through work, which she believes continues until age 14. Her ability to travel, such as flying to [place], is being questioned in relation to her disability status. However, she clarifies that her travel involves sitting at her children's home and supervising her 90 year old grandson, who is largely independent.    Past Medical History:  Diagnosis Date   Allergy Seasonal   Anemia    Asthma    Body aches 05/30/2021   Cervical paraspinous muscle spasm    Chronic left-sided low back pain with left-sided sciatica 06/02/2020   COPD (chronic obstructive pulmonary disease)    emphysema   Difficulty hearing 02/13/2012   Excessive sleepiness 01/18/2022   Family history of bladder cancer    Family history of BRCA gene mutation    Family history of breast cancer    Family history of prostate cancer    Generalized anxiety disorder    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 A variant of uncertain significance (VUS) was detected in the ATM gene called [id number]. The report date is 08/10/2019.     The Breast Cancer STAT Panel offered by [facility] includes sequencing and deletion/duplication analysis for the following 9 genes:  ATM, BRCA1, B   GERD (gastroesophageal reflux disease)    History of chemotherapy    Hyperlipidemia 03/30/2014   Hypothyroidism    Intractable headache 01/18/2022   Major depressive disorder 04/12/2021   Malignant neoplasm of upper-outer  quadrant of right breast in female, estrogen receptor positive 07/24/2019   right, 05/13/20 completed Taxol, on Herceptin   Neck pain on left side 06/02/2020   Neuropathy    Occupational bronchitis 03/03/2015   Osteoporosis    Pain in right shoulder 03/09/2021   Paresthesia 05/13/2020   Plantar  fasciitis, bilateral 02/23/2012   Pneumonia    Polyarthralgia 03/09/2021   Pulmonary emphysema 04/15/2020   Pure hypercholesterolemia 04/15/2020   Rosacea 12/15/2021   Skin cancer of face 05/01/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   Systemic lupus erythematosus 06/14/2010   Tobacco use 04/15/2020   Vitamin D deficiency 10/11/2010   Vitiligo 02/23/2012    Past Surgical History:  Procedure Laterality Date   ABLATION ON ENDOMETRIOSIS  2016   APPLICATION OF A-CELL OF EXTREMITY Right 10/22/2019   Procedure: EXCISION OF RIGHT BREAST WOUND WITH PRIMARY CLOSURE;  Surgeon: Peggye Form, DO;  Location: [facility] OR;  Service: Plastics;  Laterality: Right;   BREAST LUMPECTOMY     CERVICAL SPINE SURGERY  2024   DEBRIDEMENT AND CLOSURE WOUND Right 10/22/2019   Procedure: Excision of right breast wound;  Surgeon: Peggye Form, DO;  Location: [facility] OR;  Service: Plastics;  Laterality: Right;   MASTECTOMY W/ SENTINEL NODE BIOPSY Bilateral 09/05/2019   Procedure: BILATERAL MASTECTOMY WITH RIGHT SENTINEL LYMPH NODE BIOPSY;  Surgeon: Almond Lint, MD;  Location: [facility] OR;  Service: General;  Laterality: Bilateral;  COMBINED WITH REGIONAL FOR POST OP PAIN   PORTACATH PLACEMENT Left 09/05/2019   Procedure: INSERTION PORT-A-CATH WITH ULTRASOUND GUIDANCE;  Surgeon: Almond Lint, MD;  Location: [facility] OR;  Service: General;  Laterality: Left;   ROTATOR CUFF REPAIR Right 06/09/2021   TONSILLECTOMY     TUBAL LIGATION     WISDOM TOOTH EXTRACTION      Outpatient Medications Prior to Visit  Medication Sig Dispense Refill   acetaminophen (TYLENOL) 500 MG tablet Take 500-1,000 mg by mouth every 6 (six) hours as needed for moderate pain.      acyclovir ointment (ZOVIRAX) 5 % Apply 1 Application topically every 3 (three) hours. 30 g 0   albuterol (VENTOLIN HFA) 108 (90 Base) MCG/ACT inhaler TAKE 2 PUFFS BY MOUTH EVERY 6 HOURS AS NEEDED FOR WHEEZE OR SHORTNESS OF BREATH 8.5 each 5   ALPRAZolam (XANAX) 0.25 MG tablet Take 0.25 mg by  mouth daily as needed.     cetirizine (ZYRTEC) 10 MG tablet Take by mouth.     clobetasol cream (TEMOVATE) 0.05 % 1 application Externally Twice a day for 14 days     DULoxetine (CYMBALTA) 60 MG capsule TAKE 1 CAPSULE (60 MG TOTAL) BY MOUTH DAILY. FILL CYMBALTA 30MG  DAILY FIRST 90 capsule 3   fluticasone (FLONASE) 50 MCG/ACT nasal spray Place 2 sprays into both nostrils in the morning and at bedtime. 48 g 3   levothyroxine (SYNTHROID) 75 MCG tablet Take 1 tablet (75 mcg total) by mouth daily before breakfast. 90 tablet 3   liothyronine (CYTOMEL) 5 MCG tablet Take 2 tablets (10 mcg total) by mouth daily. TAKE 2 TABLETS (=10MCG     TOTAL)     DAILY 180 tablet 1   LORazepam (ATIVAN) 0.5 MG tablet PLEASE SEE ATTACHED FOR DETAILED DIRECTIONS     meloxicam (MOBIC) 15 MG tablet Take 1 tablet (15 mg total) by mouth as directed. 90 tablet 1   methocarbamol (ROBAXIN) 500 MG tablet Take 1 tablet (500 mg total) by mouth 4 (four) times daily. 30 tablet 1   Vitamin D, Ergocalciferol, (  DRISDOL) 1.25 MG (50000 UNIT) CAPS capsule TAKE 1 CAPSULE BY MOUTH ONE TIME PER WEEK 12 capsule 4   benzonatate (TESSALON) 200 MG capsule Take 1 capsule (200 mg total) by mouth 3 (three) times daily as needed for cough. (Patient not taking: Reported on 07/09/2023) 90 capsule 2   nicotine (NICODERM CQ) 14 mg/24hr patch Place 1 patch (14 mg total) onto the skin daily. (Patient not taking: Reported on 07/09/2023) 28 patch 2   No facility-administered medications prior to visit.    Allergies  Allergen Reactions   Bactrim [Sulfamethoxazole-Trimethoprim] Itching and Rash        Objective:     Physical Exam  Vitals and nursing note reviewed.  Constitutional:      General:  is not in acute distress.    Appearance: Normal appearance.  HENT:     Head: Normocephalic.  Pulmonary:     Effort: No respiratory distress.  Skin:    General: Skin is dry.     Coloration: Skin is not pale.  Neurological:     Mental Status: Pt is  alert and oriented to person, place, and time.  Psychiatric:        Mood and Affect: Mood normal.   Ht 5\' 5"  (1.651 m)   BMI 29.19 kg/m   Wt Readings from Last 3 Encounters:  05/10/23 175 lb 6.4 oz (79.6 kg)  05/08/23 174 lb 4 oz (79 kg)  05/03/23 175 lb (79.4 kg)       Assessment & Plan:   Problem List Items Addressed This Visit     Body aches - Primary   Chronic left-sided low back pain with left-sided sciatica   Excessive sleepiness   Neuropathy   Other Visit Diagnoses       Other fatigue         Brain fog         Assessment and Plan Assessment & Plan Neuropathy   Chronic neuropathy causes significant pain and functional limitations. She cannot sit or stand for long periods due to pain and stiffness, requiring frequent breaks during activities like long drives. Neuropathy affects daily activities, including vacuuming or cleaning without pain.  Cognitive Impairment   Impaired judgment and thinking affect her ability to work. She finds the neuropsychological testing conducted in August 2024 at [facility] inadequate. Cognitive impairment worsens with distractions, making multitasking in public or work environments difficult. She remains oriented to time, place, and person during the visit. Review neuropsychological testing results from [facility] conducted in August 2024.  Disability Evaluation   She is on social security disability and long-term disability through work. There is a need to clarify limitations for disability forms due to a discrepancy between reported limitations and activities like traveling and supervising grandchildren. She clarifies that travel is limited to visiting family and supervising an independent 73 year old grandchild. Significant pain and functional limitations impact her ability to perform work-related activities. Clarify limitations and functional status for disability forms, emphasizing the difference between travel for  family visits and work-related activities.    No orders of the defined types were placed in this encounter.   I discussed the assessment and treatment plan with the patient. The patient was provided an opportunity to ask questions and all were answered. The patient agreed with the plan and demonstrated an understanding of the instructions.   The patient was advised to call back or seek an in-person evaluation if the symptoms worsen or if the condition fails to improve as anticipated.  No follow-ups on file.  Ellsworth Haas, MD [facility] [phone] (phone) [phone] (fax)  [facility]

## 2023-07-10 ENCOUNTER — Telehealth: Payer: Self-pay | Admitting: Neurology

## 2023-07-10 DIAGNOSIS — M545 Low back pain, unspecified: Secondary | ICD-10-CM | POA: Diagnosis not present

## 2023-07-10 DIAGNOSIS — C7981 Secondary malignant neoplasm of breast: Secondary | ICD-10-CM | POA: Diagnosis not present

## 2023-07-10 DIAGNOSIS — M1990 Unspecified osteoarthritis, unspecified site: Secondary | ICD-10-CM | POA: Diagnosis not present

## 2023-07-10 DIAGNOSIS — M797 Fibromyalgia: Secondary | ICD-10-CM | POA: Diagnosis not present

## 2023-07-10 NOTE — Telephone Encounter (Signed)
 Pt. Referred for Stephanie Chase and Kim Testing but no PA every submitted and the claim was denied, Agent needs medical so she can do a retro Auth to have it approved for Pt. Please call her back ASAP

## 2023-07-10 NOTE — Telephone Encounter (Signed)
 LMOVM for [facility]. Our office do not do PA for A patient to see Dr.Merz her in our office. So we are unaware that this patient needed to have that done.

## 2023-07-23 DIAGNOSIS — R918 Other nonspecific abnormal finding of lung field: Secondary | ICD-10-CM | POA: Diagnosis not present

## 2023-07-23 DIAGNOSIS — J439 Emphysema, unspecified: Secondary | ICD-10-CM | POA: Diagnosis not present

## 2023-07-23 DIAGNOSIS — R911 Solitary pulmonary nodule: Secondary | ICD-10-CM | POA: Diagnosis not present

## 2023-07-23 DIAGNOSIS — Z79899 Other long term (current) drug therapy: Secondary | ICD-10-CM | POA: Diagnosis not present

## 2023-08-02 ENCOUNTER — Other Ambulatory Visit: Payer: Self-pay | Admitting: Family Medicine

## 2023-08-07 DIAGNOSIS — R0602 Shortness of breath: Secondary | ICD-10-CM | POA: Diagnosis not present

## 2023-08-07 DIAGNOSIS — J449 Chronic obstructive pulmonary disease, unspecified: Secondary | ICD-10-CM | POA: Diagnosis not present

## 2023-08-07 DIAGNOSIS — R911 Solitary pulmonary nodule: Secondary | ICD-10-CM | POA: Diagnosis not present

## 2023-08-07 DIAGNOSIS — Z6828 Body mass index (BMI) 28.0-28.9, adult: Secondary | ICD-10-CM | POA: Diagnosis not present

## 2023-08-07 DIAGNOSIS — G4733 Obstructive sleep apnea (adult) (pediatric): Secondary | ICD-10-CM | POA: Diagnosis not present

## 2023-08-08 ENCOUNTER — Encounter: Payer: Self-pay | Admitting: Pulmonary Disease

## 2023-08-09 ENCOUNTER — Ambulatory Visit: Payer: Self-pay

## 2023-08-09 NOTE — Telephone Encounter (Signed)
  Chief Complaint: dizziness Symptoms: dizziness(vertigo, not present now), fatigue, numbness in right fingers (not present now and intermittent) Frequency: intermittent and not present now, x few months Pertinent Negatives: Patient denies unilateral weakness, changes in speech or vision, headaches, vomiting Disposition: [] ED /[x] Urgent Care (no appt availability in office) / [] Appointment(In office/virtual)/ []  [place] [facility]/ [] Home Care/ [] Refused Recommended Disposition /[] [place] [facility]/ []  Follow-up with PCP Additional Notes: Patient out of town til May 25th, she is agreeable to go to urgent care now. Patient requested appt with PCP to follow up from urgent care. She states she told her pulmonologist about the symptoms and she checked out her ears. Advised patient to go to ED for any stroke symptoms.  Copied from CRM [id number]. Topic: Clinical - Red Word Triage >> Aug 09, 2023 12:24 PM Chuck Crater wrote: Red Word that prompted transfer to Nurse Triage: Patient is having issues with dizziness when laying down on right side and bending over. Patient is currently out of town and won't be back until 25th. Reason for Disposition  [1] NO dizziness now AND [2] age > 33  Answer Assessment - Initial Assessment Questions 1. DESCRIPTION: "Describe your dizziness."     Intermittent every day and worse when bending over or lying on right side.  2. VERTIGO: "Do you feel like either you or the room is spinning or tilting?"      Yes.  3. LIGHTHEADED: "Do you feel lightheaded?" (e.g., somewhat faint, woozy, weak upon standing)     Fatigue, she describes it as within an hour of waking up feeling like she needs to go back to bed.  4. SEVERITY: "How bad is it?"  "Can you walk?"   - MILD: Feels slightly dizzy and unsteady, but is walking normally.   - MODERATE: Feels unsteady when walking, but not falling; interferes with normal activities (e.g., school, work).   - SEVERE: Unable to walk  without falling, or requires assistance to walk without falling.     Denies any falls, she states she has lost her balance a few times but was able to catch herself. Patient unable to describe severity as she said she just woke up and hasn't been walking around.  5. ONSET:  "When did the dizziness begin?"     X few months, intermittent. Denies dizziness at present.  6. AGGRAVATING FACTORS: "Does anything make it worse?" (e.g., standing, change in head position)     Worse when lying down on right side, bending over.  7. CAUSE: "What do you think is causing the dizziness?"     Unsure.  8. RECURRENT SYMPTOM: "Have you had dizziness before?" If Yes, ask: "When was the last time?" "What happened that time?"     Denies.  9. OTHER SYMPTOMS: "Do you have any other symptoms?" (e.g., headache, weakness, numbness, vomiting, earache)     Fatigue, intermittent right fingers numbness (not present now), vomiting  10. PREGNANCY: "Is there any chance you are pregnant?" "When was your last menstrual period?"       N/A.  Protocols used: Dizziness - Vertigo-A-AH

## 2023-08-17 DIAGNOSIS — H348312 Tributary (branch) retinal vein occlusion, right eye, stable: Secondary | ICD-10-CM | POA: Diagnosis not present

## 2023-08-17 DIAGNOSIS — D3132 Benign neoplasm of left choroid: Secondary | ICD-10-CM | POA: Diagnosis not present

## 2023-08-22 ENCOUNTER — Ambulatory Visit (INDEPENDENT_AMBULATORY_CARE_PROVIDER_SITE_OTHER): Admitting: Family Medicine

## 2023-08-22 ENCOUNTER — Encounter: Payer: Self-pay | Admitting: Family Medicine

## 2023-08-22 VITALS — BP 133/88 | HR 81 | Temp 97.5°F | Resp 18 | Ht 65.0 in | Wt 172.5 lb

## 2023-08-22 DIAGNOSIS — G8929 Other chronic pain: Secondary | ICD-10-CM | POA: Diagnosis not present

## 2023-08-22 DIAGNOSIS — M25561 Pain in right knee: Secondary | ICD-10-CM | POA: Diagnosis not present

## 2023-08-22 DIAGNOSIS — M25562 Pain in left knee: Secondary | ICD-10-CM | POA: Diagnosis not present

## 2023-08-22 DIAGNOSIS — R42 Dizziness and giddiness: Secondary | ICD-10-CM

## 2023-08-22 NOTE — Patient Instructions (Signed)
 It was very nice to see you today!  [facility]  [address] on the 1st floor Phone number [phone]    PLEASE NOTE:  If you had any lab tests please let us know if you have not heard back within a few days. You may see your results on MyChart before we have a chance to review them but we will give you a call once they are reviewed by [place]. If we ordered any referrals today, please let us know if you have not heard from their office within the next week.   Please try these tips to maintain a healthy lifestyle:  Eat most of your calories during the day when you are active. Eliminate processed foods including packaged sweets (pies, cakes, cookies), reduce intake of potatoes, white bread, white pasta, and white rice. Look for whole grain options, oat flour or almond flour.  Each meal should contain half fruits/vegetables, one quarter protein, and one quarter carbs (no bigger than a computer mouse).  Cut down on sweet beverages. This includes juice, soda, and sweet tea. Also watch fruit intake, though this is a healthier sweet option, it still contains natural sugar! Limit to 3 servings daily.  Drink at least 1 glass of water with each meal and aim for at least 8 glasses per day  Exercise at least 150 minutes every week.

## 2023-08-22 NOTE — Progress Notes (Signed)
 Subjective:     Patient ID: Stephanie Chase, female    DOB: 04-03-1960, 63 y.o.   MRN: [id number]  Chief Complaint  Patient presents with   Dizziness   Knee Pain    Dizziness started a few months ago, off and on anytime when laying down down, turn over, look up or look down    HPI Discussed the use of AI scribe software for clinical note transcription with the patient, who gave verbal consent to proceed.  History of Present Illness Stephanie Chase is a 63 year old female who presents with dizziness and vertigo.  She has been experiencing dizziness for the past few months, described as a sensation of being 'drunk' with the room spinning. The dizziness is triggered by movements such as bending over, looking up, laying down, and rolling onto her right side, which also disrupts her sleep. She has a prescription for meclizine that remains unfilled. No other neuro symptoms  She also experiences generalized body aches and pain, particularly on the left side, which worsens when laying down. She reports burning sensations in her feet and back pain that persists even on sunny days. She has not consulted an orthopedic specialist for these symptoms.  Her past medical history includes osteoarthritis and high cholesterol. She is currently taking duloxetine . No slurred speech or other stroke-like symptoms. She acknowledges poor dietary habits contributing to her high cholesterol, with her LDL previously in the 160s.    Health Maintenance Due  Topic Date Due   Medicare Annual Wellness (AWV)  Never done   Lung Cancer Screening  04/04/2023    Past Medical History:  Diagnosis Date   Allergy Seasonal   Anemia    Asthma    Body aches 05/30/2021   Cervical paraspinous muscle spasm    Chronic left-sided low back pain with left-sided sciatica 06/02/2020   COPD (chronic obstructive pulmonary disease)    emphysema   Difficulty hearing 02/13/2012   Excessive sleepiness 01/18/2022   Family  history of bladder cancer    Family history of BRCA gene mutation    Family history of breast cancer    Family history of prostate cancer    Generalized anxiety disorder    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 A variant of uncertain significance (VUS) was detected in the ATM gene called [id number]. The report date is 08/10/2019.     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   right, 05/13/20 completed Taxol , on Herceptin    Neck pain on left side 06/02/2020   Neuropathy    Occupational bronchitis 03/03/2015   Osteoporosis    Pain in right shoulder 03/09/2021   Paresthesia 05/13/2020   Plantar fasciitis, bilateral 02/23/2012   Pneumonia    Polyarthralgia 03/09/2021   Pulmonary emphysema 04/15/2020   Pure hypercholesterolemia 04/15/2020   Rosacea 12/15/2021   Skin cancer of face 05/01/2023   scc   Systemic lupus erythematosus 06/14/2010   Tobacco use 04/15/2020   Vitamin D  deficiency 10/11/2010  Vitiligo 02/23/2012    Past Surgical History:  Procedure Laterality Date   ABLATION ON ENDOMETRIOSIS  2016   APPLICATION OF A-CELL OF EXTREMITY Right 10/22/2019   Procedure: EXCISION OF RIGHT BREAST WOUND WITH PRIMARY CLOSURE;  Surgeon: Thornell Flirt, DO;  Location: [facility] OR;  Service: Plastics;  Laterality: Right;   BREAST LUMPECTOMY     CERVICAL SPINE SURGERY  2024   DEBRIDEMENT AND CLOSURE WOUND Right 10/22/2019   Procedure:  Excision of right breast wound;  Surgeon: Thornell Flirt, DO;  Location: [facility] OR;  Service: Plastics;  Laterality: Right;   MASTECTOMY W/ SENTINEL NODE BIOPSY Bilateral 09/05/2019   Procedure: BILATERAL MASTECTOMY WITH RIGHT SENTINEL LYMPH NODE BIOPSY;  Surgeon: Lockie Rima, MD;  Location: [facility] OR;  Service: General;  Laterality: Bilateral;  COMBINED WITH REGIONAL FOR POST OP PAIN   PORTACATH PLACEMENT Left 09/05/2019   Procedure: INSERTION PORT-A-CATH WITH ULTRASOUND GUIDANCE;  Surgeon: Lockie Rima, MD;  Location: [facility] OR;  Service: General;  Laterality: Left;   ROTATOR CUFF REPAIR Right 06/09/2021   TONSILLECTOMY     TUBAL LIGATION     WISDOM TOOTH EXTRACTION       Current Outpatient Medications:    acetaminophen  (TYLENOL ) 500 MG tablet, Take 500-1,000 mg by mouth every 6 (six) hours as needed for moderate pain. , Disp: , Rfl:    acyclovir  ointment (ZOVIRAX ) 5 %, Apply 1 Application topically every 3 (three) hours., Disp: 30 g, Rfl: 0   albuterol  (VENTOLIN  HFA) 108 (90 Base) MCG/ACT inhaler, TAKE 2 PUFFS BY MOUTH EVERY 6 HOURS AS NEEDED FOR WHEEZE OR SHORTNESS OF BREATH, Disp: 8.5 each, Rfl: 5   ALPRAZolam  (XANAX ) 0.25 MG tablet, Take 0.25 mg by mouth daily as needed., Disp: , Rfl:    benzonatate  (TESSALON ) 200 MG capsule, Take 1 capsule (200 mg total) by mouth 3 (three) times daily as needed for cough., Disp: 90 capsule, Rfl: 2   cetirizine (ZYRTEC) 10 MG tablet, Take by mouth., Disp: , Rfl:    clobetasol cream (TEMOVATE) 0.05 %, 1 application Externally Twice a day for 14 days, Disp: , Rfl:    DULoxetine  (CYMBALTA ) 60 MG capsule, TAKE 1 CAPSULE (60 MG TOTAL) BY MOUTH DAILY. FILL CYMBALTA  30MG  DAILY FIRST, Disp: 90 capsule, Rfl: 3   fluticasone  (FLONASE ) 50 MCG/ACT nasal spray, Place 2 sprays into both nostrils in the morning and at bedtime., Disp: 48 g, Rfl: 3   fluticasone -salmeterol (ADVAIR) 250-50 MCG/ACT AEPB, Inhale 1 puff into the lungs., Disp: , Rfl:    levothyroxine  (SYNTHROID )  75 MCG tablet, Take 1 tablet (75 mcg total) by mouth daily before breakfast., Disp: 90 tablet, Rfl: 3   liothyronine  (CYTOMEL ) 5 MCG tablet, Take 2 tablets (10 mcg total) by mouth daily. TAKE 2 TABLETS (=10MCG     TOTAL)     DAILY, Disp: 180 tablet, Rfl: 1   LORazepam  (ATIVAN ) 0.5 MG tablet, PLEASE SEE ATTACHED FOR DETAILED DIRECTIONS, Disp: , Rfl:    meloxicam  (MOBIC ) 15 MG tablet, TAKE 1 TABLET (15 MG TOTAL) BY MOUTH AS DIRECTED., Disp: 90 tablet, Rfl: 0   methocarbamol  (ROBAXIN ) 500 MG tablet, Take 1 tablet (500 mg total) by mouth 4 (four) times daily., Disp: 30 tablet, Rfl: 1   Vitamin D , Ergocalciferol , (DRISDOL ) 1.25 MG (50000 UNIT) CAPS capsule, TAKE 1 CAPSULE BY MOUTH ONE TIME PER WEEK, Disp: 12 capsule, Rfl: 4   nicotine  (NICODERM CQ ) 14 mg/24hr patch, Place 1 patch (14 mg total) onto the skin daily. (Patient not  taking: Reported on 08/22/2023), Disp: 28 patch, Rfl: 2  Allergies  Allergen Reactions   Bactrim  [Sulfamethoxazole -Trimethoprim] Itching and Rash   ROS neg/noncontributory except as noted HPI/below      Objective:      BP 133/88   Pulse 81   Temp (!) 97.5 F (36.4 C) (Temporal)   Resp 18   Ht 5\' 5"  (1.651 m)   Wt 172 lb 8 oz (78.2 kg)   SpO2 98%   BMI 28.71 kg/m  Wt Readings from Last 3 Encounters:  08/22/23 172 lb 8 oz (78.2 kg)  05/10/23 175 lb 6.4 oz (79.6 kg)  05/08/23 174 lb 4 oz (79 kg)    Physical Exam   Gen: WDWN NAD HEENT: NCAT, conjunctiva not injected, sclera nonicteric.  Eomi.  No nystagmus, but dizzy w/looking up TM WNL B NECK:  supple, no thyromegaly, no nodes, no carotid bruits CARDIAC: RRR, S1S2+, no murmur.  EXT:  no edema MSK: no gross abnormalities.  NEURO: A&O x3.  CN II-XII intact.  F-n, ram, pronator drift neg PSYCH: normal mood. Good eye contact   Reviewed records from [place].  Labs in march ok x LDL 200    Assessment & Plan:  Vertigo -     Ambulatory referral to Physical Therapy  Chronic pain of both knees  Assessment and  Plan Assessment & Plan Benign paroxysmal positional vertigo   Dizziness for a few months, worsened by head movements like bending over, looking up, laying down, and rolling to the right side, suggests benign paroxysmal positional vertigo. No history of slurred speech or stroke symptoms. Meclizine was prescribed but not filled; advised against its use due to drowsiness and ineffectiveness for this vertigo type. Explained that dislodged otoliths in the semicircular canals may cause conflicting signals to the brain. Refer to physical therapy  vestibular rehabilitation exercises. Instruct on home exercises to help reposition otoliths. Advise against using meclizine. Consider ENT referral if symptoms do not improve with physical therapy.  Osteoarthritis   Chronic pain in multiple joints, including knees, hips, shoulders, back, neck, and feet,  Discussed differential diagnoses such as psoriatic arthritis, rheumatoid arthritis, and inflammatory arthritis, osteoarthritis, fibromyalgia, other. Recommend evaluation by sports medicine for a comprehensive assessment.  Neuropathy   Neuropathy is managed by neurologist Dr. Yin, who prescribes duloxetine .  Hyperlipidemia   Elevated LDL cholesterol noted in recent labs, with poor dietary habits contributing to hyperlipidemia. Total LDL over 200 mg/dL, and LDL was in the 540J mg/dL range. Discuss dietary modifications to reduce cholesterol levels-defer discussion to another visit(this is acute visit).    Return for as sch in August.  Ellsworth Haas, MD

## 2023-08-23 DIAGNOSIS — F32A Depression, unspecified: Secondary | ICD-10-CM | POA: Diagnosis not present

## 2023-08-23 DIAGNOSIS — Z1211 Encounter for screening for malignant neoplasm of colon: Secondary | ICD-10-CM | POA: Diagnosis not present

## 2023-08-23 DIAGNOSIS — C50919 Malignant neoplasm of unspecified site of unspecified female breast: Secondary | ICD-10-CM | POA: Diagnosis not present

## 2023-08-23 DIAGNOSIS — Z6828 Body mass index (BMI) 28.0-28.9, adult: Secondary | ICD-10-CM | POA: Diagnosis not present

## 2023-08-23 DIAGNOSIS — D069 Carcinoma in situ of cervix, unspecified: Secondary | ICD-10-CM | POA: Diagnosis not present

## 2023-08-23 DIAGNOSIS — Z01419 Encounter for gynecological examination (general) (routine) without abnormal findings: Secondary | ICD-10-CM | POA: Diagnosis not present

## 2023-08-23 DIAGNOSIS — M810 Age-related osteoporosis without current pathological fracture: Secondary | ICD-10-CM | POA: Diagnosis not present

## 2023-08-27 ENCOUNTER — Ambulatory Visit (INDEPENDENT_AMBULATORY_CARE_PROVIDER_SITE_OTHER)

## 2023-08-27 ENCOUNTER — Encounter: Payer: Self-pay | Admitting: Family Medicine

## 2023-08-27 ENCOUNTER — Ambulatory Visit: Admitting: Family Medicine

## 2023-08-27 VITALS — BP 124/86 | HR 78 | Ht 65.0 in | Wt 179.0 lb

## 2023-08-27 DIAGNOSIS — L930 Discoid lupus erythematosus: Secondary | ICD-10-CM | POA: Diagnosis not present

## 2023-08-27 DIAGNOSIS — M545 Low back pain, unspecified: Secondary | ICD-10-CM | POA: Diagnosis not present

## 2023-08-27 DIAGNOSIS — M546 Pain in thoracic spine: Secondary | ICD-10-CM

## 2023-08-27 DIAGNOSIS — M5137 Other intervertebral disc degeneration, lumbosacral region with discogenic back pain only: Secondary | ICD-10-CM | POA: Diagnosis not present

## 2023-08-27 DIAGNOSIS — G8929 Other chronic pain: Secondary | ICD-10-CM

## 2023-08-27 DIAGNOSIS — M2550 Pain in unspecified joint: Secondary | ICD-10-CM

## 2023-08-27 DIAGNOSIS — M25511 Pain in right shoulder: Secondary | ICD-10-CM | POA: Diagnosis not present

## 2023-08-27 DIAGNOSIS — M25561 Pain in right knee: Secondary | ICD-10-CM

## 2023-08-27 DIAGNOSIS — M4807 Spinal stenosis, lumbosacral region: Secondary | ICD-10-CM | POA: Diagnosis not present

## 2023-08-27 DIAGNOSIS — Z13828 Encounter for screening for other musculoskeletal disorder: Secondary | ICD-10-CM | POA: Diagnosis not present

## 2023-08-27 LAB — SEDIMENTATION RATE: Sed Rate: 36 mm/h — ABNORMAL HIGH (ref 0–30)

## 2023-08-27 NOTE — Patient Instructions (Addendum)
 Thank you for coming in today.   A referral for physical therapy has been submitted. A representative from the physical therapy office will contact you to coordinate scheduling after confirming your benefits with your insurance provider. If you do not hear from the physical therapy office within the next 1-2 weeks, please let us  know.   Please get an Xray and lab work today before you leave.  See you back when you're back from [place].

## 2023-08-27 NOTE — Progress Notes (Signed)
 I, Miquel Amen, CMA acting as a scribe for Garlan Juniper, MD.  Stephanie Chase is a 63 y.o. female who presents to [facility] at [facility] today for generalized joint pain. Pt locates pain to the lower back, even things such as carrying her back exacerbate sx. Occasional radiating pain into the leg, not extending beyond the knees. Denies n/t/w in the leg, does have neuropathy s/p chemo. Also c/o chronic neck and shoulder pain. Hx of C5-C6 fusion, having just below that. Notes that sx are typically right sided, some n/t. Denies increased wamth but does note swelling. Seen by rheumatology in 2022. Hx of right breast cancer. Hx of right shoulder surgery. No recent diagnostic imaging.    Pertinent review of systems: No fevers or chills  Relevant historical information: Emphysema osteoporosis.  Discoid lupus and vitiligo.   Exam:  BP 124/86   Pulse 78   Ht 5\' 5"  (1.651 m)   Wt 179 lb (81.2 kg)   SpO2 97%   BMI 29.79 kg/m  General: Well Developed, well nourished, and in no acute distress.   MSK: T-spine: Normal appearing Tender palpation around spinal midline and paraspinal musculature around the rhomboid.  Decreased shoulder motion.  L-spine: Normal appearing Nontender palpation spinal midline. Decreased lumbar motion.  Right shoulder: Normal-appearing Tender palpation lateral upper arm. Decreased shoulder motion limited abduction and functional internal rotation.  Strength intact within limits of motion.  Right knee normal-appearing normal motion with crepitation.  Intact strength.    Lab and Radiology Results  X-ray images right shoulder, T-spine, L-spine, right knee obtained today personally and independently interpreted.  Right shoulder: Calcification present in axilla.  No acute fractures.  Mild glenohumeral DJD.  T-spine: Mild DDD.  No acute compression fractures are visible.  L-spine: DDD and facet DJD most dominant at L5-S1.  No acute  fractures are visible.  Right knee: Mild medial DJD.  No acute fractures are visible.  Await formal radiology review     Assessment and Plan: 63 y.o. female with chronic multifactorial pain.  She does have a fair amount of discrete orthopedic pain.  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  Plan for rheumatologic assessment to see if she has a more systemic rheumatologic problem.  Plan for trial of physical therapy.  This should help her rhomboid pain and shoulder pain as well as her low back pain. Is leaving for [place]  at the end of the week and will be down there for greater than a month.  Will arrange for physical therapy in [place] .  Recheck when she returns consider injections.  If she is gone for an extended period of time we may be able to arrange for MRI in [place] .  PDMP not reviewed this encounter. Orders Placed This Encounter  Procedures   DG Shoulder Right    Standing Status:   Future    Number of Occurrences:   1    Expiration Date:   08/26/2024    Reason for Exam (SYMPTOM  OR DIAGNOSIS REQUIRED):   right shoulder pain    Preferred imaging location?:   [facility] Thoracic Spine W/Swimmers    Standing Status:   Future    Number of Occurrences:   1    Expiration Date:   08/26/2024    Reason for Exam (SYMPTOM  OR DIAGNOSIS REQUIRED):   thoracic back pain    Preferred imaging location?:   [facility]  DG Lumbar Spine 2-3 Views    Standing Status:   Future    Number of Occurrences:   1    Expiration Date:   09/26/2023    Reason for Exam (SYMPTOM  OR DIAGNOSIS REQUIRED):   low back pain    Preferred imaging location?:   [facility]   DG Knee AP/LAT W/[place] Right    Standing Status:   Future    Number of Occurrences:   1    Expiration Date:   09/26/2023    Reason for Exam (SYMPTOM  OR DIAGNOSIS REQUIRED):   right knee pain    Preferred imaging location?:   [facility]   ANA    Standing Status:    Future    Number of Occurrences:   1    Expiration Date:   08/26/2024   Cyclic citrul peptide antibody, IgG    Standing Status:   Future    Number of Occurrences:   1    Expiration Date:   08/26/2024   HLA-B27 antigen    Polyarthalgia    Standing Status:   Future    Number of Occurrences:   1    Expiration Date:   08/26/2024   Sedimentation rate    Standing Status:   Future    Number of Occurrences:   1    Expiration Date:   08/26/2024   Rheumatoid factor    Standing Status:   Future    Number of Occurrences:   1    Expiration Date:   08/26/2024   Ambulatory referral to Physical Therapy    Referral Priority:   Routine    Referral Type:   Physical Medicine    Referral Reason:   Specialty Services Required    Requested Specialty:   Physical Therapy    Number of Visits Requested:   1   No orders of the defined types were placed in this encounter.    Discussed warning signs or symptoms. Please see discharge instructions. Patient expresses understanding.   The above documentation has been reviewed and is accurate and complete Garlan Juniper, M.D.

## 2023-08-28 ENCOUNTER — Ambulatory Visit: Attending: Family Medicine | Admitting: Physical Therapy

## 2023-08-28 ENCOUNTER — Encounter: Payer: Self-pay | Admitting: Physical Therapy

## 2023-08-28 ENCOUNTER — Ambulatory Visit: Payer: Self-pay | Admitting: Family Medicine

## 2023-08-28 ENCOUNTER — Other Ambulatory Visit: Payer: Self-pay

## 2023-08-28 DIAGNOSIS — R42 Dizziness and giddiness: Secondary | ICD-10-CM | POA: Diagnosis not present

## 2023-08-28 DIAGNOSIS — H8111 Benign paroxysmal vertigo, right ear: Secondary | ICD-10-CM | POA: Insufficient documentation

## 2023-08-28 NOTE — Progress Notes (Signed)
Right knee x-ray shows a little bit of arthritis.

## 2023-08-28 NOTE — Therapy (Signed)
 OUTPATIENT PHYSICAL THERAPY VESTIBULAR EVALUATION     Patient Name: Stephanie Chase MRN: [id number] DOB:September 08, 1960, 63 y.o., female Today's Date: 08/28/2023  END OF SESSION:  PT End of Session - 08/28/23 1412     Visit Number 1    Number of Visits 9    Date for PT Re-Evaluation 09/27/23    Authorization Type [employer]    PT Start Time 0802    PT Stop Time 0845    PT Time Calculation (min) 43 min    Activity Tolerance Patient tolerated treatment well    Behavior During Therapy [facility] for tasks assessed/performed             Past Medical History:  Diagnosis Date   Allergy Seasonal   Anemia    Asthma    Body aches 05/30/2021   Cervical paraspinous muscle spasm    Chronic left-sided low back pain with left-sided sciatica 06/02/2020   COPD (chronic obstructive pulmonary disease)    emphysema   Difficulty hearing 02/13/2012   Excessive sleepiness 01/18/2022   Family history of bladder cancer    Family history of BRCA gene mutation    Family history of breast cancer    Family history of prostate cancer    Generalized anxiety disorder    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 A variant of uncertain significance (VUS) was detected in the ATM gene called [id number]. The report date is 08/10/2019.     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   right, 05/13/20 completed Taxol , on Herceptin    Neck pain on left side  06/02/2020   Neuropathy    Occupational bronchitis 03/03/2015   Osteoporosis    Pain in right shoulder 03/09/2021   Paresthesia 05/13/2020   Plantar fasciitis, bilateral 02/23/2012   Pneumonia    Polyarthralgia 03/09/2021   Pulmonary emphysema 04/15/2020   Pure hypercholesterolemia 04/15/2020   Rosacea 12/15/2021   Skin cancer of face 05/01/2023   scc   Systemic lupus erythematosus 06/14/2010   Tobacco use 04/15/2020   Vitamin D  deficiency 10/11/2010   Vitiligo 02/23/2012   Past Surgical History:  Procedure Laterality Date   ABLATION ON ENDOMETRIOSIS  2016   APPLICATION OF A-CELL OF EXTREMITY Right 10/22/2019   Procedure: EXCISION OF RIGHT BREAST WOUND WITH PRIMARY CLOSURE;  Surgeon: Thornell Flirt, DO;  Location: [facility] OR;  Service: Plastics;  Laterality: Right;   BREAST LUMPECTOMY     CERVICAL SPINE SURGERY  2024   DEBRIDEMENT AND CLOSURE WOUND Right 10/22/2019   Procedure: Excision of right breast wound;  Surgeon: Thornell Flirt, DO;  Location: [facility] OR;  Service: Plastics;  Laterality: Right;   MASTECTOMY W/ SENTINEL NODE BIOPSY Bilateral 09/05/2019   Procedure: BILATERAL MASTECTOMY WITH RIGHT SENTINEL LYMPH NODE BIOPSY;  Surgeon: Lockie Rima, MD;  Location: [facility] OR;  Service: General;  Laterality: Bilateral;  COMBINED WITH REGIONAL FOR POST OP PAIN   PORTACATH PLACEMENT Left 09/05/2019   Procedure: INSERTION PORT-A-CATH WITH ULTRASOUND GUIDANCE;  Surgeon:  Lockie Rima, MD;  Location: [facility] OR;  Service: General;  Laterality: Left;   ROTATOR CUFF REPAIR Right 06/09/2021   TONSILLECTOMY     TUBAL LIGATION     WISDOM TOOTH EXTRACTION     Patient Active Problem List   Diagnosis Date Noted   Hypercalcemia 02/08/2023   Generalized anxiety disorder    Neuropathy    Excessive sleepiness 01/18/2022   Intractable headache 01/18/2022   Rosacea 12/15/2021   Body aches 05/30/2021   Major depressive disorder 04/12/2021   Asthma 04/12/2021   Pain in right shoulder 03/09/2021    Polyarthralgia 03/09/2021   Neck pain on left side 06/02/2020   Chronic left-sided low back pain with left-sided sciatica 06/02/2020   Paresthesia 05/13/2020   Pulmonary emphysema 04/15/2020   Tobacco use 04/15/2020   Pure hypercholesterolemia 04/15/2020   Osteoporosis 03/12/2020   Breast cancer of upper-outer quadrant of right female breast 09/05/2019   Malignant neoplasm of upper-outer quadrant of right breast in female, estrogen receptor positive 07/24/2019   Occupational bronchitis 03/03/2015   Hyperlipidemia 03/30/2014   Generalized osteoarthritis 02/23/2012   Vitiligo 02/23/2012   Difficulty hearing 02/13/2012   Vitamin D  deficiency 10/11/2010   Hypothyroidism 06/21/2010   Systemic lupus erythematosus 06/14/2010    PCP: Christel Cousins, MD  REFERRING PROVIDER: Christel Cousins, MD   REFERRING DIAG: R42 (ICD-10-CM) - Vertigo   THERAPY DIAG:  Dizziness and giddiness  BPPV (benign paroxysmal positional vertigo), right  ONSET DATE: 08/22/2023  Rationale for Evaluation and Treatment: Rehabilitation  SUBJECTIVE:   SUBJECTIVE STATEMENT: Bending over, lying down, rolling to right, looking up gets me dizzy.  Has been going on for 3 months.  Feel as if the room is spinning.  Did have several episodes of tripping and falling in the yard prior to this, don't typically fall. Pt accompanied by: significant other  PERTINENT HISTORY: PMH:  dizziness, knee pain  PAIN:  Are you having pain? No  PRECAUTIONS: None  RED FLAGS: None   WEIGHT BEARING RESTRICTIONS: No  FALLS: Has patient fallen in last 6 months? Yes. Number of falls 1-2  LIVING ENVIRONMENT: Lives with: lives with their family Lives in: House/apartment   PLOF: Independent  PATIENT GOALS: To get rid of the dizziness  OBJECTIVE:  Note: Objective measures were completed at Evaluation unless otherwise noted.  DIAGNOSTIC FINDINGS: NA for this episode  COGNITION: Overall cognitive status: Within  functional limits for tasks assessed  POSTURE:  rounded shoulders  Cervical ROM:  Moves slowly  Active A/PROM (deg) eval  Flexion 40 dizziness  Extension 30 dizziness  Right lateral flexion   Left lateral flexion   Right rotation 60  Left rotation 60  (Blank rows = not tested)   BED MOBILITY:  Independent, guarded  TRANSFERS: Assistive device utilized: None  Sit to stand: Modified independence Stand to sit: Modified independence  GAIT: Gait pattern: guarded, slowed and step through pattern Distance walked: clinic distances Assistive device utilized: None Level of assistance: Complete Independence  PATIENT SURVEYS:  DHI 34  VESTIBULAR ASSESSMENT:  GENERAL OBSERVATION: Pt closes eyes multiple times during oculomotor testing due to dizziness, guarded motions with neck ROM testing   SYMPTOM BEHAVIOR:  Subjective history: Hx of hearing and tinnitus, but not new.  Pt has been moving slowly to avoid positions that make her dizzy.  Reports approx 3 month onset of dizziness with lying down, rolling to right side in bed, looking up or bending down.  Reports no illness, no head injury,  but she did report several falls due to tripping in her yard prior to vertigo.  Non-Vestibular symptoms: changes in hearing, headaches, tinnitus, and nausea/vomiting  Type of dizziness: Spinning/Vertigo and like I've been drinking  Frequency: daily, every time I lie down  Duration: seconds  Aggravating factors: Induced by position change: lying supine, rolling to the right, and supine to sit and Induced by motion: looking up at the ceiling and bending down to the ground  Relieving factors: head stationary and slow movements  Progression of symptoms: worse  OCULOMOTOR EXAM:  Wears progressives  Ocular Alignment: normal  Ocular ROM: No Limitations-very slowed and pt reports nausea  Spontaneous Nystagmus: absent  Gaze-Induced Nystagmus: absent  Smooth Pursuits: very slow and pt reports it brings  on nausea 2/10 horizontal and vertical   Saccades: slow and brings on nausea  Convergence/Divergence: NT cm    VESTIBULAR - OCULAR REFLEX:   Slow VOR: Comment: very slow, brings on dizziness and nausea 2-3/10  VOR Cancellation: Normal, slowed, and mild symptoms  Head-Impulse Test: HIT Right: difficult to assess due to pt's guarding HIT Left: symptoms worse to L  Dynamic Visual Acuity: NT   POSITIONAL TESTING: Right Dix-Hallpike: no nystagmus and Duration: NA; no symptoms initial DH Left Dix-Hallpike: no nystagmus and feels a little spinning; did not note nystagmus Right Roll Test: no nystagmus and no symptoms Left Roll Test: no nystagmus and no symptoms  *Performed R loaded DH:  R upbeating rotary nystagmus approx 10 seconds  MOTION SENSITIVITY:  Motion Sensitivity Quotient Intensity: 0 = none, 1 = Lightheaded, 2 = Mild, 3 = Moderate, 4 = Severe, 5 = Vomiting  Intensity  1. Sitting to supine   2. Supine to L side   3. Supine to R side   4. Supine to sitting   5. L Hallpike-Dix   6. Up from L    7. R Hallpike-Dix   8. Up from R    9. Sitting, head tipped to L knee   10. Head up from L knee   11. Sitting, head tipped to R knee   12. Head up from R knee   13. Sitting head turns x5   14.Sitting head nods x5   15. In stance, 180 turn to L    16. In stance, 180 turn to R                                                                                                                                 TREATMENT DATE: 08/28/2023   Canalith Repositioning:  Epley Right: Number of Reps: 1, Response to Treatment: symptoms improved, and Comment: No nystagmus with retest of DH  PATIENT EDUCATION: Education details: Eval results, POC; BPPV findings; discussed CRM and post-CRM care (good hydration, not avoiding specific movement patterns, return to re-assess BPPV) Person educated: Patient and significant other Education method: Explanation Education comprehension: verbalized  understanding  HOME EXERCISE PROGRAM:  GOALS:  Goals reviewed with patient? Yes  SHORT TERM GOALS: = LTGs  LONG TERM GOALS: Target date: 09/27/2023  Pt will be independent with HEP for improved dizziness. Baseline:  Goal status: INITIAL  2.  Pt will report 0/10 dizziness with bed mobility. Baseline:  Goal status: INITIAL  3.  Pt will improve DHI to score of less than or equal to 16 to demo improved dizziness. Baseline: 34 Goal status: INITIAL  ASSESSMENT:  CLINICAL IMPRESSION: Patient is a 63 y.o. female who was seen today for physical therapy evaluation and treatment for vertigo.  She reports 3 month history of room-spinning dizziness, which occurs with rolling to R, lying into bed, looking up or bending/looking down.  She denies head injury or illness preceding onset of vertigo, but she does report that she fell several times in her yard.  Pt reports dizziness with cervical ROM and with oculomotor testing today; she visibly winces during these tests and reports 2/10 nausea with oculomotor testing.  Oculomotor testing appears WFL except for patient's symptoms.  She becomes more symptomatic with VOR testing and she is guarded with HIT, though R HIT appears positive.  With positional testing, she initially does not have symptoms, but on second (loaded) Dix-Hallpike position, she has R upbeating torsional nystagmus, indicative of R posterior canal BPPV.  Treated with R Epley maneuver x 1, and pt does not have symptoms with retesting.  She is to travel to [place]  for almost one month in the next week, so will try to reassess and treat vertigo as needed before patient leaves town.   OBJECTIVE IMPAIRMENTS: dizziness.   ACTIVITY LIMITATIONS: bending, sleeping, bed mobility, and reach over head  PARTICIPATION LIMITATIONS: meal prep, cleaning, laundry, and community activity  PERSONAL FACTORS: 3+ comorbidities: see above are also affecting patient's functional outcome.   REHAB POTENTIAL:  Good  CLINICAL DECISION MAKING: Stable/uncomplicated  EVALUATION COMPLEXITY: Low   PLAN:  PT FREQUENCY: 1-2x/week  PT DURATION: 4 weeks plus eval   PLANNED INTERVENTIONS: 97110-Therapeutic exercises, 97530- Therapeutic activity, [id number]- Neuromuscular re-education, 97535- Self Care, 82956- Manual therapy, 95992- Canalith repositioning, and Patient/Family education  PLAN FOR NEXT SESSION: Reassess R DH and treat as needed.  Consider Brandt-Daroff for habituation; further balance testing; pt is to leave out of town and will need to discuss POC   Dalisha Shively W., PT 08/28/2023, 2:36 PM   [facility] at [facility] [address] [place], [address] [place], [place] [address] Phone # [phone] Fax # [phone]

## 2023-08-29 ENCOUNTER — Encounter: Payer: Self-pay | Admitting: Physical Therapy

## 2023-08-29 ENCOUNTER — Ambulatory Visit: Admitting: Physical Therapy

## 2023-08-29 DIAGNOSIS — H8111 Benign paroxysmal vertigo, right ear: Secondary | ICD-10-CM

## 2023-08-29 DIAGNOSIS — R42 Dizziness and giddiness: Secondary | ICD-10-CM | POA: Diagnosis not present

## 2023-08-29 NOTE — Therapy (Signed)
 OUTPATIENT PHYSICAL THERAPY VESTIBULAR EVALUATION     Patient Name: Stephanie Chase MRN: [id number] DOB:June 20, 1960, 63 y.o., female Today's Date: 08/29/2023  END OF SESSION:  PT End of Session - 08/29/23 1406     Visit Number 2    Number of Visits 9    Date for PT Re-Evaluation 09/27/23    Authorization Type [employer]    PT Start Time 1405    Activity Tolerance Patient tolerated treatment well    Behavior During Therapy [facility] for tasks assessed/performed              Past Medical History:  Diagnosis Date   Allergy Seasonal   Anemia    Asthma    Body aches 05/30/2021   Cervical paraspinous muscle spasm    Chronic left-sided low back pain with left-sided sciatica 06/02/2020   COPD (chronic obstructive pulmonary disease)    emphysema   Difficulty hearing 02/13/2012   Excessive sleepiness 01/18/2022   Family history of bladder cancer    Family history of BRCA gene mutation    Family history of breast cancer    Family history of prostate cancer    Generalized anxiety disorder    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 A variant of uncertain significance (VUS) was detected in the ATM gene called [id number]. The report date is 08/10/2019.     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   right, 05/13/20 completed Taxol , on Herceptin    Neck pain on left side 06/02/2020   Neuropathy    Occupational bronchitis  03/03/2015   Osteoporosis    Pain in right shoulder 03/09/2021   Paresthesia 05/13/2020   Plantar fasciitis, bilateral 02/23/2012   Pneumonia    Polyarthralgia 03/09/2021   Pulmonary emphysema 04/15/2020   Pure hypercholesterolemia 04/15/2020   Rosacea 12/15/2021   Skin cancer of face 05/01/2023   scc   Systemic lupus erythematosus 06/14/2010   Tobacco use 04/15/2020   Vitamin D  deficiency 10/11/2010   Vitiligo 02/23/2012   Past Surgical History:  Procedure Laterality Date   ABLATION ON ENDOMETRIOSIS  2016   APPLICATION OF A-CELL OF EXTREMITY Right 10/22/2019   Procedure: EXCISION OF RIGHT BREAST WOUND WITH PRIMARY CLOSURE;  Surgeon: Thornell Flirt, DO;  Location: [facility] OR;  Service: Plastics;  Laterality: Right;   BREAST LUMPECTOMY     CERVICAL SPINE SURGERY  2024   DEBRIDEMENT AND CLOSURE WOUND Right 10/22/2019   Procedure: Excision of right breast wound;  Surgeon: Thornell Flirt, DO;  Location: [facility] OR;  Service: Plastics;  Laterality: Right;   MASTECTOMY W/ SENTINEL NODE BIOPSY Bilateral 09/05/2019   Procedure: BILATERAL MASTECTOMY WITH RIGHT SENTINEL LYMPH NODE BIOPSY;  Surgeon: Lockie Rima, MD;  Location: [facility] OR;  Service: General;  Laterality: Bilateral;  COMBINED WITH REGIONAL FOR POST OP PAIN   PORTACATH PLACEMENT Left 09/05/2019   Procedure: INSERTION PORT-A-CATH WITH ULTRASOUND GUIDANCE;  Surgeon: Lockie Rima, MD;  Location: [facility] OR;  Service: General;  Laterality: Left;  ROTATOR CUFF REPAIR Right 06/09/2021   TONSILLECTOMY     TUBAL LIGATION     WISDOM TOOTH EXTRACTION     Patient Active Problem List   Diagnosis Date Noted   Hypercalcemia 02/08/2023   Generalized anxiety disorder    Neuropathy    Excessive sleepiness 01/18/2022   Intractable headache 01/18/2022   Rosacea 12/15/2021   Body aches 05/30/2021   Major depressive disorder 04/12/2021   Asthma 04/12/2021   Pain in right shoulder 03/09/2021   Polyarthralgia 03/09/2021   Neck pain on left  side 06/02/2020   Chronic left-sided low back pain with left-sided sciatica 06/02/2020   Paresthesia 05/13/2020   Pulmonary emphysema 04/15/2020   Tobacco use 04/15/2020   Pure hypercholesterolemia 04/15/2020   Osteoporosis 03/12/2020   Breast cancer of upper-outer quadrant of right female breast 09/05/2019   Malignant neoplasm of upper-outer quadrant of right breast in female, estrogen receptor positive 07/24/2019   Occupational bronchitis 03/03/2015   Hyperlipidemia 03/30/2014   Generalized osteoarthritis 02/23/2012   Vitiligo 02/23/2012   Difficulty hearing 02/13/2012   Vitamin D  deficiency 10/11/2010   Hypothyroidism 06/21/2010   Systemic lupus erythematosus 06/14/2010    PCP: Christel Cousins, MD  REFERRING PROVIDER: Christel Cousins, MD   REFERRING DIAG: R42 (ICD-10-CM) - Vertigo   THERAPY DIAG:  Dizziness and giddiness  BPPV (benign paroxysmal positional vertigo), right  ONSET DATE: 08/22/2023  Rationale for Evaluation and Treatment: Rehabilitation  SUBJECTIVE:   SUBJECTIVE STATEMENT: No episodes of dizziness, it just feels like I have a head cold, Pt accompanied by: significant other  PERTINENT HISTORY: PMH:  dizziness, knee pain  PAIN:  Are you having pain? No  PRECAUTIONS: None  RED FLAGS: None   WEIGHT BEARING RESTRICTIONS: No  FALLS: Has patient fallen in last 6 months? Yes. Number of falls 1-2  LIVING ENVIRONMENT: Lives with: lives with their family Lives in: House/apartment   PLOF: Independent  PATIENT GOALS: To get rid of the dizziness  OBJECTIVE:    TODAY'S TREATMENT: 08/29/2023 Activity Comments  L DH No nystagmus, "just before dizziness"  R DH  Nausea, but no nystagmus   R loaded DH Nausea, no nystagmus (?downbeating?-hard to tell due to lots of blinking)  R Brandt-Daroff x 2, with lessening symptoms           Access Code: [email or web address] URL: [email or web address] Date: 08/29/2023 Prepared by: [facility] -  [facility]  Exercises - Brandt-Daroff Vestibular Exercise  - 1-2 x daily - 7 x weekly - 1 sets - 3 reps  PATIENT EDUCATION: Education details: HEP initiated for habituation; rationale for this progression  Person educated: Patient and significant other  Education method: Explanation, Demonstration, Verbal cues, and Handouts Education comprehension: verbalized understanding and returned demonstration  --------------------------------------------------- Note: Objective measures were completed at Evaluation unless otherwise noted.  DIAGNOSTIC FINDINGS: NA for this episode  COGNITION: Overall cognitive status: Within functional limits for tasks assessed  POSTURE:  rounded shoulders  Cervical ROM:  Moves slowly  Active A/PROM (deg) eval  Flexion 40 dizziness  Extension 30 dizziness  Right lateral flexion   Left lateral flexion   Right rotation 60  Left rotation 60  (Blank rows = not tested)   BED MOBILITY:  Independent, guarded  TRANSFERS: Assistive device utilized: None  Sit to stand: Modified independence Stand to sit: Modified independence  GAIT: Gait pattern: guarded, slowed and step through pattern Distance walked: clinic distances Assistive device utilized: None Level of assistance: Complete Independence  PATIENT SURVEYS:  DHI 34  VESTIBULAR ASSESSMENT:  GENERAL OBSERVATION: Pt closes eyes multiple times during oculomotor testing due to dizziness, guarded motions with neck ROM testing   SYMPTOM BEHAVIOR:  Subjective history: Hx of hearing and tinnitus, but not new.  Pt has been moving slowly to avoid positions that make her dizzy.  Reports approx 3 month onset of dizziness with lying down, rolling to right side in bed, looking up or bending down.  Reports no illness, no head injury, but she did report several falls due to tripping in her yard prior to vertigo.  Non-Vestibular symptoms: changes in hearing, headaches, tinnitus, and  nausea/vomiting  Type of dizziness: Spinning/Vertigo and like I've been drinking  Frequency: daily, every time I lie down  Duration: seconds  Aggravating factors: Induced by position change: lying supine, rolling to the right, and supine to sit and Induced by motion: looking up at the ceiling and bending down to the ground  Relieving factors: head stationary and slow movements  Progression of symptoms: worse  OCULOMOTOR EXAM:  Wears progressives  Ocular Alignment: normal  Ocular ROM: No Limitations-very slowed and pt reports nausea  Spontaneous Nystagmus: absent  Gaze-Induced Nystagmus: absent  Smooth Pursuits: very slow and pt reports it brings on nausea 2/10 horizontal and vertical   Saccades: slow and brings on nausea  Convergence/Divergence: NT cm    VESTIBULAR - OCULAR REFLEX:   Slow VOR: Comment: very slow, brings on dizziness and nausea 2-3/10  VOR Cancellation: Normal, slowed, and mild symptoms  Head-Impulse Test: HIT Right: difficult to assess due to pt's guarding HIT Left: symptoms worse to L  Dynamic Visual Acuity: NT   POSITIONAL TESTING: Right Dix-Hallpike: no nystagmus and Duration: NA; no symptoms initial DH Left Dix-Hallpike: no nystagmus and feels a little spinning; did not note nystagmus Right Roll Test: no nystagmus and no symptoms Left Roll Test: no nystagmus and no symptoms  *Performed R loaded DH:  R upbeating rotary nystagmus approx 10 seconds  MOTION SENSITIVITY:  Motion Sensitivity Quotient Intensity: 0 = none, 1 = Lightheaded, 2 = Mild, 3 = Moderate, 4 = Severe, 5 = Vomiting  Intensity  1. Sitting to supine   2. Supine to L side   3. Supine to R side   4. Supine to sitting   5. L Hallpike-Dix   6. Up from L    7. R Hallpike-Dix   8. Up from R    9. Sitting, head tipped to L knee   10. Head up from L knee   11. Sitting, head tipped to R knee   12. Head up from R knee   13. Sitting head turns x5   14.Sitting head nods x5   15. In stance, 180  turn to L    16. In stance, 180 turn to R  TREATMENT DATE: 08/28/2023   Canalith Repositioning:  Epley Right: Number of Reps: 1, Response to Treatment: symptoms improved, and Comment: No nystagmus with retest of DH  PATIENT EDUCATION: Education details: Eval results, POC; BPPV findings; discussed CRM and post-CRM care (good hydration, not avoiding specific movement patterns, return to re-assess BPPV) Person educated: Patient and significant other Education method: Explanation Education comprehension: verbalized understanding  HOME EXERCISE PROGRAM:  GOALS: Goals reviewed with patient? Yes  SHORT TERM GOALS: = LTGs  LONG TERM GOALS: Target date: 09/27/2023  Pt will be independent with HEP for improved dizziness. Baseline:  Goal status: INITIAL  2.  Pt will report 0/10 dizziness with bed mobility. Baseline:  Goal status: INITIAL  3.  Pt will improve DHI to score of less than or equal to 16 to demo improved dizziness. Baseline: 34 Goal status: INITIAL  ASSESSMENT:  CLINICAL IMPRESSION: Pt presents today with no complaints of dizziness with bed mobility last night, just fullness in head. Skilled PT session focused on reassessing BPPV, with pt not having nystagmus, but just reporting "it feels on the edge of starting".  Assessed R DH x 2, and unsure if second time, pt may have downbeating/rotary? Nystagmus (hard to tell with lots of blinking).  Performed Brandt-Daroff x 2 reps, with pt not having nystagmus in sidelying position, but mild symptoms of nausea, which do lessen from rep 1 to 2.  Instructed in Brandt-Daroff for habituation for HEP.  She will be leaving to go out of town x 1 month and instructed pt that we will keep chart open x 4-6 weeks in case she needs us  when she returns.    OBJECTIVE IMPAIRMENTS: dizziness.   ACTIVITY LIMITATIONS:  bending, sleeping, bed mobility, and reach over head  PARTICIPATION LIMITATIONS: meal prep, cleaning, laundry, and community activity  PERSONAL FACTORS: 3+ comorbidities: see above are also affecting patient's functional outcome.   REHAB POTENTIAL: Good  CLINICAL DECISION MAKING: Stable/uncomplicated  EVALUATION COMPLEXITY: Low   PLAN:  PT FREQUENCY: 1-2x/week  PT DURATION: 4 weeks plus eval   PLANNED INTERVENTIONS: 97110-Therapeutic exercises, 97530- Therapeutic activity, 97112- Neuromuscular re-education, 97535- Self Care, 16109- Manual therapy, [id number]- Canalith repositioning, and Patient/Family education  PLAN FOR NEXT SESSION: Hold chart open x 4-6 weeks, in case pt needs to return to reassess vertigo or to progress habituation, when she returns from being out of town.  Kelsey Patricia., PT 08/29/2023, 2:06 PM   [facility] [address] [place], [address] [place], [place] [address] Phone # [phone] Fax # [phone]

## 2023-08-30 ENCOUNTER — Encounter: Payer: Self-pay | Admitting: Family Medicine

## 2023-08-30 ENCOUNTER — Ambulatory Visit: Payer: Medicare HMO | Admitting: Pulmonary Disease

## 2023-08-30 LAB — ANTI-NUCLEAR AB-TITER (ANA TITER)
ANA TITER: 1:80 {titer} — ABNORMAL HIGH
ANA Titer 1: 1:80 {titer} — ABNORMAL HIGH

## 2023-08-30 LAB — RHEUMATOID FACTOR: Rheumatoid fact SerPl-aCnc: <10 [IU]/mL (ref <14)

## 2023-08-30 LAB — CYCLIC CITRUL PEPTIDE ANTIBODY, IGG: Cyclic Citrullin Peptide Ab: <16 U

## 2023-08-30 LAB — ANA: Anti Nuclear Antibody (ANA): POSITIVE — AB

## 2023-08-30 LAB — HLA-B27 ANTIGEN: HLA-B27 Antigen: NEGATIVE

## 2023-08-31 NOTE — Telephone Encounter (Signed)
 I called and spoke with first name.  Will communicate with rheumatology if she needs an appointment.  She has seen Dr. Rodell Citrin in the past.

## 2023-08-31 NOTE — Telephone Encounter (Signed)
 Forwarding to Dr. Denyse Amass to review and advise.

## 2023-08-31 NOTE — Progress Notes (Signed)
 Some of your rheumatology labs are positive.  Your ANA is a little bit positive and your sedimentation rate a marker of inflammation is a bit positive.  Do you have a rheumatologist?

## 2023-09-03 DIAGNOSIS — G8929 Other chronic pain: Secondary | ICD-10-CM | POA: Diagnosis not present

## 2023-09-03 DIAGNOSIS — M546 Pain in thoracic spine: Secondary | ICD-10-CM | POA: Diagnosis not present

## 2023-09-03 DIAGNOSIS — M25511 Pain in right shoulder: Secondary | ICD-10-CM | POA: Diagnosis not present

## 2023-09-03 DIAGNOSIS — M545 Low back pain, unspecified: Secondary | ICD-10-CM | POA: Diagnosis not present

## 2023-09-03 NOTE — Progress Notes (Signed)
Lumbar spine x-ray shows a little bit of arthritis at the base of the spine.

## 2023-09-03 NOTE — Progress Notes (Signed)
 Thoracic spine x-ray shows mild arthritis.

## 2023-09-03 NOTE — Progress Notes (Signed)
 Right shoulder x-ray looks normal to radiology

## 2023-09-05 ENCOUNTER — Other Ambulatory Visit: Payer: Self-pay | Admitting: Family Medicine

## 2023-09-05 ENCOUNTER — Other Ambulatory Visit: Payer: Self-pay | Admitting: Hematology & Oncology

## 2023-09-05 DIAGNOSIS — D8989 Other specified disorders involving the immune mechanism, not elsewhere classified: Secondary | ICD-10-CM | POA: Diagnosis not present

## 2023-09-05 DIAGNOSIS — M3219 Other organ or system involvement in systemic lupus erythematosus: Secondary | ICD-10-CM | POA: Diagnosis not present

## 2023-09-05 DIAGNOSIS — M810 Age-related osteoporosis without current pathological fracture: Secondary | ICD-10-CM | POA: Diagnosis not present

## 2023-09-05 DIAGNOSIS — E039 Hypothyroidism, unspecified: Secondary | ICD-10-CM

## 2023-09-05 DIAGNOSIS — G629 Polyneuropathy, unspecified: Secondary | ICD-10-CM | POA: Diagnosis not present

## 2023-09-05 DIAGNOSIS — G8929 Other chronic pain: Secondary | ICD-10-CM | POA: Diagnosis not present

## 2023-09-05 DIAGNOSIS — R768 Other specified abnormal immunological findings in serum: Secondary | ICD-10-CM | POA: Diagnosis not present

## 2023-09-05 DIAGNOSIS — L930 Discoid lupus erythematosus: Secondary | ICD-10-CM | POA: Diagnosis not present

## 2023-09-14 ENCOUNTER — Encounter: Payer: Self-pay | Admitting: Family Medicine

## 2023-09-14 DIAGNOSIS — M5412 Radiculopathy, cervical region: Secondary | ICD-10-CM

## 2023-09-17 NOTE — Telephone Encounter (Signed)
 Forwarding to Dr. Joane to confirm MRI C-spine, MRI L-spine, or both  Per visit note:  Assessment and Plan: 63 y.o. female with chronic multifactorial pain.  She does have a fair amount of discrete orthopedic pain.  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  Plan for rheumatologic assessment to see if she has a more systemic rheumatologic problem.  Plan for trial of physical therapy.  This should help her rhomboid pain and shoulder pain as well as her low back pain. Is leaving for [place]  at the end of the week and will be down there for greater than a month.  Will arrange for physical therapy in [place] .  Recheck when she returns consider injections.  If she is gone for an extended period of time we may be able to arrange for MRI in [place] .

## 2023-09-18 DIAGNOSIS — M3210 Systemic lupus erythematosus, organ or system involvement unspecified: Secondary | ICD-10-CM | POA: Diagnosis not present

## 2023-09-18 DIAGNOSIS — G8929 Other chronic pain: Secondary | ICD-10-CM | POA: Diagnosis not present

## 2023-09-18 DIAGNOSIS — M810 Age-related osteoporosis without current pathological fracture: Secondary | ICD-10-CM | POA: Diagnosis not present

## 2023-09-18 DIAGNOSIS — G629 Polyneuropathy, unspecified: Secondary | ICD-10-CM | POA: Diagnosis not present

## 2023-09-18 DIAGNOSIS — L930 Discoid lupus erythematosus: Secondary | ICD-10-CM | POA: Diagnosis not present

## 2023-09-18 DIAGNOSIS — R768 Other specified abnormal immunological findings in serum: Secondary | ICD-10-CM | POA: Diagnosis not present

## 2023-10-01 DIAGNOSIS — M4722 Other spondylosis with radiculopathy, cervical region: Secondary | ICD-10-CM | POA: Diagnosis not present

## 2023-10-01 DIAGNOSIS — M5412 Radiculopathy, cervical region: Secondary | ICD-10-CM | POA: Diagnosis not present

## 2023-10-01 NOTE — Telephone Encounter (Signed)
Forwarding to Dr. Corey to review.  

## 2023-10-02 NOTE — Telephone Encounter (Signed)
 Forwarding to Dr. Denyse Amass to review and advise.

## 2023-10-03 DIAGNOSIS — H348312 Tributary (branch) retinal vein occlusion, right eye, stable: Secondary | ICD-10-CM | POA: Diagnosis not present

## 2023-10-03 NOTE — Telephone Encounter (Signed)
 Forwarding to Dr. Denyse Amass as Lorain Childes.

## 2023-10-08 DIAGNOSIS — G4733 Obstructive sleep apnea (adult) (pediatric): Secondary | ICD-10-CM | POA: Diagnosis not present

## 2023-10-08 DIAGNOSIS — R0683 Snoring: Secondary | ICD-10-CM | POA: Diagnosis not present

## 2023-10-10 ENCOUNTER — Telehealth: Payer: Self-pay

## 2023-10-10 NOTE — Telephone Encounter (Signed)
 Form completed, placed on Dr. Zollie Pee desk to review and sign.

## 2023-10-10 NOTE — Telephone Encounter (Signed)
 Long Term Disability  [place]  [employer] (F) [phone]  Incident Number: [id number]

## 2023-10-15 NOTE — Telephone Encounter (Signed)
 Form faxed successfully to [phone], sent to scan.

## 2023-10-18 DIAGNOSIS — R911 Solitary pulmonary nodule: Secondary | ICD-10-CM | POA: Diagnosis not present

## 2023-10-18 DIAGNOSIS — Z6828 Body mass index (BMI) 28.0-28.9, adult: Secondary | ICD-10-CM | POA: Diagnosis not present

## 2023-10-18 DIAGNOSIS — F17210 Nicotine dependence, cigarettes, uncomplicated: Secondary | ICD-10-CM | POA: Diagnosis not present

## 2023-10-18 DIAGNOSIS — J449 Chronic obstructive pulmonary disease, unspecified: Secondary | ICD-10-CM | POA: Diagnosis not present

## 2023-10-18 DIAGNOSIS — G4733 Obstructive sleep apnea (adult) (pediatric): Secondary | ICD-10-CM | POA: Diagnosis not present

## 2023-10-18 DIAGNOSIS — R0602 Shortness of breath: Secondary | ICD-10-CM | POA: Diagnosis not present

## 2023-10-22 ENCOUNTER — Ambulatory Visit: Admitting: Family Medicine

## 2023-10-22 ENCOUNTER — Ambulatory Visit (INDEPENDENT_AMBULATORY_CARE_PROVIDER_SITE_OTHER)

## 2023-10-22 ENCOUNTER — Encounter: Payer: Self-pay | Admitting: Family Medicine

## 2023-10-22 VITALS — BP 132/84 | HR 95 | Ht 65.0 in | Wt 170.0 lb

## 2023-10-22 DIAGNOSIS — M542 Cervicalgia: Secondary | ICD-10-CM

## 2023-10-22 DIAGNOSIS — M79641 Pain in right hand: Secondary | ICD-10-CM

## 2023-10-22 DIAGNOSIS — G8929 Other chronic pain: Secondary | ICD-10-CM | POA: Diagnosis not present

## 2023-10-22 DIAGNOSIS — L930 Discoid lupus erythematosus: Secondary | ICD-10-CM

## 2023-10-22 DIAGNOSIS — M545 Low back pain, unspecified: Secondary | ICD-10-CM

## 2023-10-22 NOTE — Patient Instructions (Addendum)
 Thank you for coming in today.   Please get an Xray today before you leave   Contact your rheumatologist and see about changing your medications  Happy to order labs if needed  Let me know if you would like a referral to hand therapy

## 2023-10-22 NOTE — Progress Notes (Signed)
 I, Leotis Batter, CMA acting as a scribe for Artist Lloyd, MD.  Stephanie Chase is a 63 y.o. female who presents to [facility] today for cont'd LBP and multiple myalgias in the setting of lupus. Pt was last seen by Dr. Lloyd on 08/27/23 and was referred to PT and labs were obtained.  Today, pt reports improvement of lower back pain since scheduling visit. Has been dx with Lupus, started on meds and was doing well until she had adverse reaction. Would like to discuss neck pain and review MRI today. Would also like to discuss pain of the R 2nd digit. Denies swelling or locking of the finger. Neck pain waxes and wanes, tends to be exacerbated by driving. C/O radiating pain into the L UE and n/t B UE.   She lives part-time in [place].  Her rheumatologist and physical therapist are both in [place].  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  She only was taken the medicine for about a week when she developed a rash and stop the medicine.  She did think it helped her overall pain however.  She does think physical therapy helped and she is still doing the exercises.  She is not sure how long she is going to be in [place] before she goes back to [place].  Dx testing: 08/27/23 L-spine, T-spine, R shoulder, & R knee XR and labs  Pertinent review of systems: No fevers or chills  Relevant historical information: Discoid lupus.  History of cervical spine fusion   Exam:  BP 132/84   Pulse 95   Ht 5' 5 (1.651 m)   Wt 170 lb (77.1 kg)   SpO2 93%   BMI 28.29 kg/m  General: Well Developed, well nourished, and in no acute distress.   MSK: Right index finger slightly swollen otherwise normal-appearing normal motion.  Mildly tender to palpation.  C-spine: Normal.  Normal cervical motion.  L-spine normal lumbar motion.    Lab and Radiology Results  Study:MRI CERVICAL SPINE WO CONTRAST  History:M54.12 Radiculopathy  cervical region,  M54.12  COMPARISON:None  TECHNIQUE: Multiplanar multisequence MR images of the cervical spine obtained without contrast.  FINDINGS:  Postoperative changes of C5-C6 anterior and interbody fusion. There is decrease in the normal cervical lordosis. No subluxations. Vertebral body heights within normal limits. No acute marrow signal abnormality.  C1/2: No canal stenosis.  C2-3: No canal or foraminal stenosis.  C3-4: No canal stenosis.  Uncovertebral joint arthrosis and facet arthrosis present with mild bilateral foraminal stenosis.  C4-5: No canal or foraminal stenosis.  C5-6: No canal stenosis.  Uncovertebral joint arthrosis and facet arthrosis present with mild right foraminal stenosis. Left foramen patent.  C6-7: No canal or foraminal stenosis.  C7/T1: No canal or foraminal stenosis.  The spinal cord is normal in signal and volume. The paravertebral soft tissues are unremarkable.   IMPRESSION:  Postoperative and mild degenerative changes.  Mild foraminal stenosis at C3-4 and C5-C6 from spondylosis. No significant canal stenosis.    [id number] Exam End: 10/01/23 19:48   Specimen Collected: 10/02/23 00:00 Last Resulted: 10/02/23      EXAM: LUMBAR SPINE - 2-3 VIEW   COMPARISON:  Lumbar MRI 01/31/2023   FINDINGS: Five non-rib-bearing lumbar vertebra. Normal lumbar alignment. Normal vertebral body heights. Minor L5-S1 disc space narrowing. Minor L5-S1 facet hypertrophy. No fracture or focal bone abnormality. Sacroiliac joints are congruent.   IMPRESSION: Minor L5-S1 degenerative disc disease and facet hypertrophy.  Electronically Signed   By: Andrea Gasman M.D.   On: 08/31/2023 23:54   I, Artist Lloyd, personally (independently) visualized and performed the interpretation of the images attached in this note.  See scanned rheumatologic labs from rheumatology in [place] .   X-ray images right hand obtained today personally and independently  interpreted. No acute fractures.  No severe DJD especially at the right index finger. Await formal radiology review  Assessment and Plan: 63 y.o. female with   Chronic pain C-spine and lumbar spine.  Less likely to be due to lupus and more likely due to muscle spasm dysfunction as well as some degenerative changes seen on imaging.  Physical therapy should be quite helpful for these issues.  Plan to continue home exercise previously taught by PT.  Could arrange for physical therapy here in [place] area if needed.  Would use either [facility] [place] or [place] if needed. I do not think lupus is causing much of the spine pain.  Hand pain: This could be due to lupus.  Getting back on appropriate lupus treatment is reasonable.  Please see this discussion below.  If needed could arrange for occupational therapy with a phone call or MyChart message here in [place].  Lupus treatment: Patient did have some benefit from hydroxychloroquine but rapidly developed a rash and discontinued the medication.  Her rheumatologist is in [place].  I strongly recommended that she contact her rheumatologist office and ask for a medication change.  I do think it could probably be managed with follow-up labs remotely but I would understand why the rheumatologist may want her to come back in person.  We could arrange for a rheumatologist here in [place] but I do not think that makes much sense as the majority of the time she lives in [place].     PDMP not reviewed this encounter. Orders Placed This Encounter  Procedures   DG Hand Complete Right    Standing Status:   Future    Number of Occurrences:   1    Expiration Date:   11/22/2023    Reason for Exam (SYMPTOM  OR DIAGNOSIS REQUIRED):   right hand pain    Preferred imaging location?:   [facility]   No orders of the defined types were placed in this encounter.    Discussed warning signs or symptoms. Please see discharge instructions.  Patient expresses understanding.   The above documentation has been reviewed and is accurate and complete Artist Lloyd, M.D.

## 2023-10-23 ENCOUNTER — Ambulatory Visit: Admitting: Family Medicine

## 2023-10-26 ENCOUNTER — Other Ambulatory Visit: Payer: Medicare HMO

## 2023-10-28 ENCOUNTER — Ambulatory Visit: Payer: Self-pay | Admitting: Family Medicine

## 2023-10-28 NOTE — Progress Notes (Signed)
Right hand x-ray looks okay to radiology.

## 2023-10-31 ENCOUNTER — Ambulatory Visit: Admitting: Family Medicine

## 2023-10-31 ENCOUNTER — Ambulatory Visit: Payer: Medicare HMO | Admitting: "Endocrinology

## 2023-10-31 VITALS — BP 115/77 | HR 86 | Temp 97.8°F | Resp 16 | Ht 65.0 in | Wt 170.0 lb

## 2023-10-31 DIAGNOSIS — M2550 Pain in unspecified joint: Secondary | ICD-10-CM | POA: Diagnosis not present

## 2023-10-31 DIAGNOSIS — E7800 Pure hypercholesterolemia, unspecified: Secondary | ICD-10-CM

## 2023-10-31 DIAGNOSIS — M3219 Other organ or system involvement in systemic lupus erythematosus: Secondary | ICD-10-CM

## 2023-10-31 DIAGNOSIS — R7303 Prediabetes: Secondary | ICD-10-CM

## 2023-10-31 DIAGNOSIS — E559 Vitamin D deficiency, unspecified: Secondary | ICD-10-CM | POA: Diagnosis not present

## 2023-10-31 DIAGNOSIS — E063 Autoimmune thyroiditis: Secondary | ICD-10-CM | POA: Diagnosis not present

## 2023-10-31 DIAGNOSIS — F411 Generalized anxiety disorder: Secondary | ICD-10-CM

## 2023-10-31 DIAGNOSIS — G629 Polyneuropathy, unspecified: Secondary | ICD-10-CM

## 2023-10-31 DIAGNOSIS — F330 Major depressive disorder, recurrent, mild: Secondary | ICD-10-CM

## 2023-10-31 MED ORDER — DULOXETINE HCL 30 MG PO CPEP: 30.0000 mg | ORAL_CAPSULE | Freq: Every day | ORAL | 1 refills | Status: DC

## 2023-10-31 MED ORDER — MELOXICAM 15 MG PO TABS: 15.0000 mg | ORAL_TABLET | ORAL | 1 refills | Status: AC

## 2023-10-31 NOTE — Progress Notes (Signed)
 Subjective:     Patient ID: Stephanie Chase, female    DOB: 11-18-1960, 63 y.o.   MRN: [id number]  Chief Complaint  Patient presents with   Medical Management of Chronic Issues    6 month follow-up     HPI Discussed the use of AI scribe software for clinical note transcription with the patient, who gave verbal consent to proceed.  History of Present Illness Stephanie Chase is a 63 year old female with systemic lupus erythematosus and Hashimoto's thyroiditis who presents for follow-up on her autoimmune conditions and medication management.  She reports that a rheumatologist in [place]  told her she may have systemic lupus erythematosus and Hashimoto's thyroiditis. She was initially started on Plaquenil but developed an allergic reaction characterized by a diffuse erythematous, bumpy, and pruritic rash. The rash improved upon discontinuation of the medication but recurred and worsened upon reinitiation. She has not yet informed her rheumatologist about the recurrence of the rash after restarting Plaquenil.  She manages asthma and COPD with albuterol  as needed and Wixela (250/50) one puff daily.  For her thyroid  condition(Hashimoto's), she takes 75 mcg of Synthroid  and 10 mg of Cytomel  daily.   She continues to take meloxicam  for musculoskeletal pain and methocarbamol  as needed, though she tries to avoid it. Her vitamin D  supplementation has been reduced to once every two weeks or once a month.  She is on duloxetine  60 mg for neuropathy and depression and reports worsening depression so tried bid dosing and insomnia when taking it twice a day.  So now, wants to decrease to 30mg  daily.  No SI  She reports a significant change in bowel habits, now having bowel movements daily or up to three times a day, compared to her previous pattern of once a week. She sometimes experiences soft stools and occasional rectal bleeding. She has a history of polyps and pre-cancerous findings on previous  colonoscopies and is due for another colonoscopy. Dr. Kristie  She has a history of depression and is experiencing increased symptoms, feeling like she wants to cry all day. She has tried adjusting her duloxetine  dosage but experienced insomnia. She is not currently taking Wellbutrin but has a history of it affecting her mental health. She denies suicidal ideation but acknowledges understanding how someone could feel that way due to her symptoms.  She is managing prediabetes and is working on diet and exercise, attending physical therapy three times a week. She reports increased physical activity compared to before. No chest pain, worsening shortness of breath, or vomiting. She experiences excessive sweating, which may be related to her medication.    Health Maintenance Due  Topic Date Due   Medicare Annual Wellness (AWV)  Never done   Lung Cancer Screening  04/04/2023   INFLUENZA VACCINE  10/26/2023    Past Medical History:  Diagnosis Date   Allergy Seasonal   Anemia    Asthma    Body aches 05/30/2021   Breast cancer (HCC) 07/15/19   Cervical paraspinous muscle spasm    Chronic left-sided low back pain with left-sided sciatica 06/02/2020   COPD (chronic obstructive pulmonary disease)    emphysema   Difficulty hearing 02/13/2012   Excessive sleepiness 01/18/2022   Family history of bladder cancer    Family history of BRCA gene mutation    Family history of breast cancer    Family history of prostate cancer    Generalized anxiety disorder    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 A variant of uncertain significance (VUS) was detected in the ATM gene called [id number]. The report date is 08/10/2019.     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/2019   right, 05/13/20 completed Taxol , on Herceptin    Neck pain on left side 06/02/2020   Neuropathy    Occupational bronchitis 03/03/2015   Osteoporosis    Pain in right shoulder 03/09/2021   Paresthesia 05/13/2020   Plantar fasciitis, bilateral 02/23/2012   Pneumonia    Polyarthralgia 03/09/2021   Pulmonary emphysema 04/15/2020   Pure hypercholesterolemia 04/15/2020   Rosacea 12/15/2021   Skin cancer of face 05/01/2023   scc   Systemic lupus erythematosus 06/14/2010   Tobacco use 04/15/2020   Vitamin D  deficiency 10/11/2010   Vitiligo 02/23/2012    Past Surgical History:  Procedure Laterality Date   ABLATION ON ENDOMETRIOSIS  2016   APPLICATION OF A-CELL OF EXTREMITY Right 10/22/2019   Procedure: EXCISION OF RIGHT BREAST WOUND WITH PRIMARY CLOSURE;  Surgeon: Lowery Estefana RAMAN, DO;  Location: [facility] OR;  Service: Plastics;  Laterality: Right;   BREAST LUMPECTOMY     CERVICAL SPINE SURGERY  2024   DEBRIDEMENT AND CLOSURE WOUND Right 10/22/2019   Procedure: Excision of right breast wound;  Surgeon: Lowery Estefana RAMAN, DO;  Location: [facility] OR;  Service: Plastics;  Laterality: Right;   MASTECTOMY W/ SENTINEL NODE BIOPSY Bilateral 09/05/2019   Procedure: BILATERAL MASTECTOMY WITH RIGHT SENTINEL LYMPH NODE BIOPSY;  Surgeon: Aron Shoulders, MD;  Location: [facility] OR;  Service: General;  Laterality: Bilateral;  COMBINED WITH REGIONAL FOR POST OP PAIN   PORTACATH PLACEMENT Left 09/05/2019   Procedure: INSERTION PORT-A-CATH WITH ULTRASOUND GUIDANCE;  Surgeon: Aron Shoulders, MD;  Location: [facility] OR;  Service: General;  Laterality: Left;   ROTATOR CUFF REPAIR Right 06/09/2021   TONSILLECTOMY      TUBAL LIGATION     WISDOM TOOTH EXTRACTION       Current Outpatient Medications:    acetaminophen  (TYLENOL ) 500 MG tablet, Take 500-1,000 mg by mouth every 6 (six) hours as needed for moderate pain. , Disp: , Rfl:    albuterol  (VENTOLIN  HFA) 108 (90 Base) MCG/ACT inhaler, TAKE 2 PUFFS BY MOUTH EVERY 6 HOURS AS NEEDED FOR WHEEZE OR SHORTNESS OF BREATH, Disp: 8.5 each, Rfl: 5   DULoxetine  (CYMBALTA ) 30 MG capsule, Take 1 capsule (30 mg total) by mouth daily., Disp: 90 capsule, Rfl: 1   fluticasone  (FLONASE ) 50 MCG/ACT nasal spray, Place 2 sprays into both nostrils in the morning and at bedtime., Disp: 48 g, Rfl: 3   fluticasone -salmeterol (ADVAIR) 250-50 MCG/ACT AEPB, Inhale 1 puff into the lungs., Disp: , Rfl:    levothyroxine  (SYNTHROID ) 75 MCG tablet, Take 1 tablet (75 mcg total) by mouth daily before breakfast., Disp: 90 tablet, Rfl: 3   liothyronine  (CYTOMEL ) 5 MCG tablet, TAKE 2 TABLETS BY MOUTH DAILY, Disp: 180 tablet, Rfl: 0   methocarbamol  (ROBAXIN ) 500 MG tablet, Take 1 tablet (500 mg total) by mouth 4 (four) times daily., Disp: 30 tablet,  Rfl: 1   Vitamin D , Ergocalciferol , (DRISDOL ) 1.25 MG (50000 UNIT) CAPS capsule, TAKE 1 CAPSULE BY MOUTH ONE TIME PER WEEK, Disp: 12 capsule, Rfl: 4   meloxicam  (MOBIC ) 15 MG tablet, Take 1 tablet (15 mg total) by mouth as directed., Disp: 90 tablet, Rfl: 1  Allergies  Allergen Reactions   Bactrim  [Sulfamethoxazole -Trimethoprim] Itching and Rash   Hydroxychloroquine Rash   ROS neg/noncontributory except as noted HPI/below      Objective:     BP 115/77   Pulse 86   Temp 97.8 F (36.6 C) (Temporal)   Resp 16   Ht 5' 5 (1.651 m)   Wt 170 lb (77.1 kg)   SpO2 95%   BMI 28.29 kg/m  Wt Readings from Last 3 Encounters:  10/31/23 170 lb (77.1 kg)  10/22/23 170 lb (77.1 kg)  08/27/23 179 lb (81.2 kg)    Physical Exam   Gen: WDWN NAD HEENT: NCAT, conjunctiva not injected, sclera nonicteric NECK:  supple, no thyromegaly, no nodes,  no carotid bruits CARDIAC: RRR, S1S2+, no murmur. DP 2+B LUNGS: CTAB. No wheezes ABDOMEN:  BS+, soft, NTND, No HSM, no masses EXT:  no edema MSK: no gross abnormalities.  NEURO: A&O x3.  CN II-XII intact.  PSYCH: normal mood. Good eye contact +vitiligo  I personally spent a total of 40 minutes in the care of the patient today including preparing to see the patient, getting/reviewing separately obtained history, performing a medically appropriate exam/evaluation, placing orders, and reviewing labs.      Assessment & Plan:  Hypothyroidism due to Hashimoto thyroiditis -     T4, free -     T3, free -     TSH  Prediabetes -     CBC with Differential/Platelet -     Comprehensive metabolic panel with GFR -     Hemoglobin A1c  Hypercalcemia -     CBC with Differential/Platelet -     Comprehensive metabolic panel with GFR  Polyarthralgia -     Vitamin B12  Generalized anxiety disorder  Vitamin D  deficiency -     VITAMIN D  25 Hydroxy (Vit-D Deficiency, Fractures)  Mild episode of recurrent major depressive disorder (HCC)  Neuropathy  Other systemic lupus erythematosus with other organ involvement (HCC)  Pure hypercholesterolemia  Other orders -     DULoxetine  HCl; Take 1 capsule (30 mg total) by mouth daily.  Dispense: 90 capsule; Refill: 1 -     Meloxicam ; Take 1 tablet (15 mg total) by mouth as directed.  Dispense: 90 tablet; Refill: 1  Assessment and Plan Assessment & Plan Systemic lupus erythematosus (SLE) with allergic reaction to hydroxychloroquine   SLE is confirmed by a rheumatologist. She experienced an allergic reaction to hydroxychloroquine, presenting as a red, bumpy, and itchy rash, confirmed by self-reported re-challenge. Contact the rheumatologist to report the reaction and discuss alternative treatments.  Hashimoto's thyroiditis   Managed with Synthroid  75 mcg and Cytomel  5 mcg twice daily.  Asthma-COPD overlap syndrome   Managed with albuterol  as needed  and Wixela 250/50, one puff daily.  Depression   Treated with duloxetine  60 mg daily. She reports worsening depression and insomnia, though not currently suicidal, she acknowledges increased emotional distress. Reduce duloxetine  to 30 mg daily and monitor for withdrawal symptoms such as sweats, shakiness, and nausea. Advise seeking immediate help if experiencing suicidal thoughts. Consider alternative antidepressants if depression worsens.  Peripheral neuropathy   Managed with duloxetine , which may not be effective. Now, some issues w/duloxetine   so will decrease dose and monitor if worsens neuropathy  Chronic musculoskeletal pain    Fair Managed with meloxicam  and methocarbamol  as needed. Refill meloxicam  prescription.  Abnormal bowel habits   She has increased bowel frequency from once a week to daily or more, with occasional soft stools. A colonoscopy is due due to changes in bowel habits and a history of polyps and precancerous findings. Contact Dr. Kristie for colonoscopy.  Prediabetes   Managed with diet and exercise. She reports increased physical activity through physical therapy three times a week.     Discussion of thyroid , B12, and vitamin D  levels.-low in past  Follow-Up   She travels between [place], affecting follow-up logistics. Virtual visits are not possible due to insurance restrictions. Follow up with the rheumatologist regarding SLE treatment and contact Dr. Kristie for colonoscopy. Monitor depression symptoms and adjust treatment as needed.    Return in about 6 months (around 05/02/2024) for chronic follow-up.  Jenkins CHRISTELLA Carrel, MD

## 2023-10-31 NOTE — Patient Instructions (Signed)
 It was very nice to see you today!  Call Dr. Kristie for colon. Duloxetine  30mg /day    PLEASE NOTE:  If you had any lab tests please let us  know if you have not heard back within a few days. You may see your results on MyChart before we have a chance to review them but we will give you a call once they are reviewed by us . If we ordered any referrals today, please let us  know if you have not heard from their office within the next week.   Please try these tips to maintain a healthy lifestyle:  Eat most of your calories during the day when you are active. Eliminate processed foods including packaged sweets (pies, cakes, cookies), reduce intake of potatoes, white bread, white pasta, and white rice. Look for whole grain options, oat flour or almond flour.  Each meal should contain half fruits/vegetables, one quarter protein, and one quarter carbs (no bigger than a computer mouse).  Cut down on sweet beverages. This includes juice, soda, and sweet tea. Also watch fruit intake, though this is a healthier sweet option, it still contains natural sugar! Limit to 3 servings daily.  Drink at least 1 glass of water with each meal and aim for at least 8 glasses per day  Exercise at least 150 minutes every week.

## 2023-11-01 LAB — COMPREHENSIVE METABOLIC PANEL WITH GFR
ALT: 9 U/L (ref 0–35)
AST: 20 U/L (ref 0–37)
Albumin: 4.3 g/dL (ref 3.5–5.2)
Alkaline Phosphatase: 102 U/L (ref 39–117)
BUN: 8 mg/dL (ref 6–23)
CO2: 24 meq/L (ref 19–32)
Calcium: 10.5 mg/dL (ref 8.4–10.5)
Chloride: 99 meq/L (ref 96–112)
Creatinine, Ser: 0.71 mg/dL (ref 0.40–1.20)
GFR: 91.46 mL/min (ref >60.00)
Glucose, Bld: 88 mg/dL (ref 70–99)
Potassium: 3.8 meq/L (ref 3.5–5.1)
Sodium: 135 meq/L (ref 135–145)
Total Bilirubin: 0.4 mg/dL (ref 0.2–1.2)
Total Protein: 7.4 g/dL (ref 6.0–8.3)

## 2023-11-01 LAB — HEMOGLOBIN A1C: Hgb A1c MFr Bld: 6 % (ref 4.6–6.5)

## 2023-11-01 LAB — CBC WITH DIFFERENTIAL/PLATELET
Basophils Absolute: 0.1 K/uL (ref 0.0–0.1)
Basophils Relative: 0.7 % (ref 0.0–3.0)
Eosinophils Absolute: 0.2 K/uL (ref 0.0–0.7)
Eosinophils Relative: 2 % (ref 0.0–5.0)
HCT: 42 % (ref 36.0–46.0)
Hemoglobin: 13.9 g/dL (ref 12.0–15.0)
Lymphocytes Relative: 34.3 % (ref 12.0–46.0)
Lymphs Abs: 2.9 K/uL (ref 0.7–4.0)
MCHC: 33.2 g/dL (ref 30.0–36.0)
MCV: 83.1 fl (ref 78.0–100.0)
Monocytes Absolute: 0.6 K/uL (ref 0.1–1.0)
Monocytes Relative: 7.6 % (ref 3.0–12.0)
Neutro Abs: 4.7 K/uL (ref 1.4–7.7)
Neutrophils Relative %: 55.4 % (ref 43.0–77.0)
Platelets: 276 K/uL (ref 150.0–400.0)
RBC: 5.05 Mil/uL (ref 3.87–5.11)
RDW: 14.2 % (ref 11.5–15.5)
WBC: 8.5 K/uL (ref 4.0–10.5)

## 2023-11-01 LAB — TSH: TSH: 0.66 u[IU]/mL (ref 0.35–5.50)

## 2023-11-01 LAB — T3, FREE: T3, Free: 3.2 pg/mL (ref 2.3–4.2)

## 2023-11-01 LAB — VITAMIN B12: Vitamin B-12: 232 pg/mL (ref 211–911)

## 2023-11-01 LAB — VITAMIN D 25 HYDROXY (VIT D DEFICIENCY, FRACTURES): VITD: 35.56 ng/mL (ref 30.00–100.00)

## 2023-11-01 LAB — T4, FREE: Free T4: 0.73 ng/dL (ref 0.60–1.60)

## 2023-11-05 ENCOUNTER — Ambulatory Visit: Payer: Medicare HMO | Admitting: Family Medicine

## 2023-11-05 ENCOUNTER — Ambulatory Visit: Payer: Self-pay | Admitting: Family Medicine

## 2023-11-05 NOTE — Progress Notes (Signed)
 1.  Your Vitamin B12 is low or low normal-please take B12 vitamins 2000mcg(B complex vitamins are fine as well).   2.  A1C(3 month average of sugars) is elevated.  This is considered PreDiabetes.  Work on diet-decrease sugars and starches and aim for 30 minutes of exercise 5 days/week to prevent progression to diabetes  3.  Rest of labs ok

## 2023-11-15 DIAGNOSIS — Z1211 Encounter for screening for malignant neoplasm of colon: Secondary | ICD-10-CM | POA: Diagnosis not present

## 2023-11-20 LAB — COLOGUARD: COLOGUARD: NEGATIVE

## 2023-12-01 ENCOUNTER — Other Ambulatory Visit: Payer: Self-pay | Admitting: Family Medicine

## 2023-12-01 DIAGNOSIS — E039 Hypothyroidism, unspecified: Secondary | ICD-10-CM

## 2023-12-03 ENCOUNTER — Telehealth: Payer: Self-pay | Admitting: Family Medicine

## 2023-12-03 NOTE — Telephone Encounter (Signed)
 Received call from [facility] for additional information regarding disability claim.   Provided additional information: Chronic pain Fatigue Brain fog Neuropathy Trouble focusing/processing Inability for remain in a single position for lengths of time

## 2023-12-03 NOTE — Telephone Encounter (Signed)
Left detailed message to return my call.

## 2023-12-03 NOTE — Telephone Encounter (Unsigned)
 Copied from CRM [id number]. 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 Wendolyn indicated that she believes patient cannot do sedentary work and labs/medical documentation showing otherwise.    [phone] ext [phone]

## 2023-12-04 DIAGNOSIS — Z7969 Long term (current) use of other immunomodulators and immunosuppressants: Secondary | ICD-10-CM | POA: Diagnosis not present

## 2023-12-04 DIAGNOSIS — M069 Rheumatoid arthritis, unspecified: Secondary | ICD-10-CM | POA: Diagnosis not present

## 2023-12-04 DIAGNOSIS — M329 Systemic lupus erythematosus, unspecified: Secondary | ICD-10-CM | POA: Diagnosis not present

## 2023-12-04 DIAGNOSIS — R760 Raised antibody titer: Secondary | ICD-10-CM | POA: Diagnosis not present

## 2023-12-04 DIAGNOSIS — E063 Autoimmune thyroiditis: Secondary | ICD-10-CM | POA: Diagnosis not present

## 2023-12-21 ENCOUNTER — Telehealth: Payer: Self-pay | Admitting: Family Medicine

## 2023-12-21 NOTE — Telephone Encounter (Signed)
 Response from rheumatology.  No referral needed at this time.

## 2023-12-21 NOTE — Telephone Encounter (Signed)
-----   Message from Lonni ORN [facility] sent at 12/03/2023  4:12 PM EDT ----- Regarding: RE: New labs.  Does she need an appointment. Sorry about the very late reply, I'm getting to some old messages.  I wouldn't be very excited about the low positive ANA especially if she has a history of hashimoto's thyroiditis that will give a false positive pretty often. So systemic lupus looks pretty unlikely to me from what I can review. For discoid lupus the serology is often not important since the reaction can be limited to the skin. 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 She could probably follow up there. [place] ----- Message ----- From: Joane Artist RAMAN, MD Sent: 08/31/2023   8:22 AM EDT To: Lonni ORN Ester, MD Subject: New labs.  Does she need an appointment.       I saw Roshell P Craun this week for multiple different pain locations including T and L-spine shoulder and more diffuse pain.  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  I checked some basic rheumatologic labs and she had a mildly elevated sedimentation rate at 36 and a mild ANA titer elevation at 1: 80.  Should she see you again?  I do not want to waste her time with a referral if this is not really anything to be excited about. Please let me know what you think. Myrlene Riera

## 2023-12-24 NOTE — Telephone Encounter (Signed)
 I spoke with Dr. Ethyl about Stephanie Chase's call yesterday evening. She forgot to address her having ear pain and throat pain while she was in office. She stated she is having pain in both. Dr. Ethyl suggested that she use throat lodgings for sore throat and Ibprofin or Advil for pain, If not better in 10 days to 2 weeks,she will need to be seen. Pt stated she will call another Dr. Sharman had stated something about an MRI.She will call back as needed.

## 2024-01-02 DIAGNOSIS — R760 Raised antibody titer: Secondary | ICD-10-CM | POA: Diagnosis not present

## 2024-01-02 DIAGNOSIS — M329 Systemic lupus erythematosus, unspecified: Secondary | ICD-10-CM | POA: Diagnosis not present

## 2024-01-02 DIAGNOSIS — Z7969 Long term (current) use of other immunomodulators and immunosuppressants: Secondary | ICD-10-CM | POA: Diagnosis not present

## 2024-01-02 DIAGNOSIS — E063 Autoimmune thyroiditis: Secondary | ICD-10-CM | POA: Diagnosis not present

## 2024-01-02 DIAGNOSIS — M069 Rheumatoid arthritis, unspecified: Secondary | ICD-10-CM | POA: Diagnosis not present

## 2024-01-03 DIAGNOSIS — I2510 Atherosclerotic heart disease of native coronary artery without angina pectoris: Secondary | ICD-10-CM | POA: Diagnosis not present

## 2024-01-03 DIAGNOSIS — J438 Other emphysema: Secondary | ICD-10-CM | POA: Diagnosis not present

## 2024-01-03 DIAGNOSIS — R911 Solitary pulmonary nodule: Secondary | ICD-10-CM | POA: Diagnosis not present

## 2024-01-16 DIAGNOSIS — B078 Other viral warts: Secondary | ICD-10-CM | POA: Diagnosis not present

## 2024-01-16 DIAGNOSIS — L932 Other local lupus erythematosus: Secondary | ICD-10-CM | POA: Diagnosis not present

## 2024-01-16 DIAGNOSIS — L219 Seborrheic dermatitis, unspecified: Secondary | ICD-10-CM | POA: Diagnosis not present

## 2024-01-31 ENCOUNTER — Other Ambulatory Visit: Payer: Self-pay | Admitting: Family Medicine

## 2024-01-31 ENCOUNTER — Other Ambulatory Visit: Payer: Self-pay

## 2024-01-31 DIAGNOSIS — F411 Generalized anxiety disorder: Secondary | ICD-10-CM

## 2024-01-31 MED ORDER — DULOXETINE HCL 30 MG PO CPEP: 30.0000 mg | ORAL_CAPSULE | Freq: Every day | ORAL | Status: AC

## 2024-01-31 NOTE — Telephone Encounter (Unsigned)
 Copied from CRM [id number]. Topic: Clinical - Medication Refill >> Jan 31, 2024 12:44 PM Macario HERO wrote: Medication: DULoxetine  (CYMBALTA ) 30 MG capsule [[id number]]  Has the patient contacted their pharmacy? Yes (Agent: If no, request that the patient contact the pharmacy for the refill. If patient does not wish to contact the pharmacy document the reason why and proceed with request.) (Agent: If yes, when and what did the pharmacy advise?)  This is the patient's preferred pharmacy:  [id number] [address], [place] - [address] [place] [address] Phone: [phone] Fax: [phone]   Is this the correct pharmacy for this prescription? Yes If no, delete pharmacy and type the correct one.   Has the prescription been filled recently? Yes  Is the patient out of the medication? No, almost  Has the patient been seen for an appointment in the last year OR does the patient have an upcoming appointment? Yes  Can we respond through MyChart? Yes  Agent: Please be advised that Rx refills may take up to 3 business days. We ask that you follow-up with your pharmacy.

## 2024-04-29 ENCOUNTER — Other Ambulatory Visit: Payer: Self-pay | Admitting: Family Medicine

## 2024-04-29 DIAGNOSIS — F411 Generalized anxiety disorder: Secondary | ICD-10-CM

## 2024-05-06 ENCOUNTER — Ambulatory Visit: Admitting: Family Medicine
